# Patient Record
Sex: Female | Born: 1935 | Race: White | Hispanic: No | Marital: Single | Smoking: Former smoker
Health system: Southern US, Community
[De-identification: ages and names within clinical notes are randomized; demographics above are authoritative.]

## PROBLEM LIST (undated history)

## (undated) DIAGNOSIS — I499 Cardiac arrhythmia, unspecified: Secondary | ICD-10-CM

## (undated) DIAGNOSIS — I491 Atrial premature depolarization: Secondary | ICD-10-CM

## (undated) DIAGNOSIS — I5082 Biventricular heart failure: Secondary | ICD-10-CM

## (undated) DIAGNOSIS — C55 Malignant neoplasm of uterus, part unspecified: Secondary | ICD-10-CM

## (undated) DIAGNOSIS — G5600 Carpal tunnel syndrome, unspecified upper limb: Secondary | ICD-10-CM

## (undated) DIAGNOSIS — Z801 Family history of malignant neoplasm of trachea, bronchus and lung: Secondary | ICD-10-CM

## (undated) DIAGNOSIS — E785 Hyperlipidemia, unspecified: Secondary | ICD-10-CM

## (undated) DIAGNOSIS — R0600 Dyspnea, unspecified: Secondary | ICD-10-CM

## (undated) DIAGNOSIS — I5020 Unspecified systolic (congestive) heart failure: Secondary | ICD-10-CM

## (undated) DIAGNOSIS — Z8489 Family history of other specified conditions: Secondary | ICD-10-CM

## (undated) DIAGNOSIS — K589 Irritable bowel syndrome without diarrhea: Secondary | ICD-10-CM

## (undated) DIAGNOSIS — I071 Rheumatic tricuspid insufficiency: Secondary | ICD-10-CM

## (undated) DIAGNOSIS — J449 Chronic obstructive pulmonary disease, unspecified: Secondary | ICD-10-CM

## (undated) DIAGNOSIS — Z853 Personal history of malignant neoplasm of breast: Secondary | ICD-10-CM

## (undated) DIAGNOSIS — Z8042 Family history of malignant neoplasm of prostate: Secondary | ICD-10-CM

## (undated) DIAGNOSIS — N179 Acute kidney failure, unspecified: Secondary | ICD-10-CM

## (undated) DIAGNOSIS — I872 Venous insufficiency (chronic) (peripheral): Secondary | ICD-10-CM

## (undated) DIAGNOSIS — K5730 Diverticulosis of large intestine without perforation or abscess without bleeding: Secondary | ICD-10-CM

## (undated) DIAGNOSIS — Z85828 Personal history of other malignant neoplasm of skin: Secondary | ICD-10-CM

## (undated) DIAGNOSIS — D649 Anemia, unspecified: Secondary | ICD-10-CM

## (undated) DIAGNOSIS — F411 Generalized anxiety disorder: Secondary | ICD-10-CM

## (undated) DIAGNOSIS — E042 Nontoxic multinodular goiter: Secondary | ICD-10-CM

## (undated) DIAGNOSIS — K52831 Collagenous colitis: Secondary | ICD-10-CM

## (undated) DIAGNOSIS — J189 Pneumonia, unspecified organism: Secondary | ICD-10-CM

## (undated) DIAGNOSIS — L03119 Cellulitis of unspecified part of limb: Secondary | ICD-10-CM

## (undated) DIAGNOSIS — M810 Age-related osteoporosis without current pathological fracture: Secondary | ICD-10-CM

## (undated) DIAGNOSIS — M1990 Unspecified osteoarthritis, unspecified site: Secondary | ICD-10-CM

## (undated) DIAGNOSIS — E0590 Thyrotoxicosis, unspecified without thyrotoxic crisis or storm: Secondary | ICD-10-CM

## (undated) DIAGNOSIS — I10 Essential (primary) hypertension: Secondary | ICD-10-CM

## (undated) DIAGNOSIS — E041 Nontoxic single thyroid nodule: Secondary | ICD-10-CM

## (undated) DIAGNOSIS — I509 Heart failure, unspecified: Secondary | ICD-10-CM

## (undated) DIAGNOSIS — M4120 Other idiopathic scoliosis, site unspecified: Secondary | ICD-10-CM

## (undated) DIAGNOSIS — Z808 Family history of malignant neoplasm of other organs or systems: Secondary | ICD-10-CM

## (undated) DIAGNOSIS — L02419 Cutaneous abscess of limb, unspecified: Secondary | ICD-10-CM

## (undated) DIAGNOSIS — Z807 Family history of other malignant neoplasms of lymphoid, hematopoietic and related tissues: Secondary | ICD-10-CM

## (undated) HISTORY — PX: SKIN CANCER EXCISION: SHX779

## (undated) HISTORY — DX: Family history of malignant neoplasm of trachea, bronchus and lung: Z80.1

## (undated) HISTORY — DX: Nontoxic multinodular goiter: E04.2

## (undated) HISTORY — DX: Family history of malignant neoplasm of prostate: Z80.42

## (undated) HISTORY — DX: Irritable bowel syndrome, unspecified: K58.9

## (undated) HISTORY — PX: CATARACT EXTRACTION: SUR2

## (undated) HISTORY — PX: OTHER SURGICAL HISTORY: SHX169

## (undated) HISTORY — PX: TOTAL KNEE ARTHROPLASTY: SHX125

## (undated) HISTORY — DX: Generalized anxiety disorder: F41.1

## (undated) HISTORY — DX: Atrial premature depolarization: I49.1

## (undated) HISTORY — PX: EYE SURGERY: SHX253

## (undated) HISTORY — DX: Essential (primary) hypertension: I10

## (undated) HISTORY — PX: ABDOMINAL HYSTERECTOMY: SHX81

## (undated) HISTORY — DX: Unspecified osteoarthritis, unspecified site: M19.90

## (undated) HISTORY — DX: Venous insufficiency (chronic) (peripheral): I87.2

## (undated) HISTORY — DX: Anemia, unspecified: D64.9

## (undated) HISTORY — PX: HERNIA REPAIR: SHX51

## (undated) HISTORY — PX: MASTECTOMY, PARTIAL: SHX709

## (undated) HISTORY — DX: Family history of malignant neoplasm of other organs or systems: Z80.8

## (undated) HISTORY — DX: Family history of other malignant neoplasms of lymphoid, hematopoietic and related tissues: Z80.7

## (undated) HISTORY — DX: Thyrotoxicosis, unspecified without thyrotoxic crisis or storm: E05.90

## (undated) HISTORY — DX: Personal history of malignant neoplasm of breast: Z85.3

## (undated) HISTORY — DX: Hyperlipidemia, unspecified: E78.5

## (undated) HISTORY — DX: Cutaneous abscess of limb, unspecified: L02.419

## (undated) HISTORY — DX: Collagenous colitis: K52.831

## (undated) HISTORY — DX: Carpal tunnel syndrome, unspecified upper limb: G56.00

## (undated) HISTORY — DX: Nontoxic single thyroid nodule: E04.1

## (undated) HISTORY — DX: Malignant neoplasm of uterus, part unspecified: C55

## (undated) HISTORY — DX: Heart failure, unspecified: I50.9

## (undated) HISTORY — DX: Cellulitis of unspecified part of limb: L03.119

## (undated) HISTORY — DX: Age-related osteoporosis without current pathological fracture: M81.0

## (undated) HISTORY — DX: Family history of other specified conditions: Z84.89

## (undated) HISTORY — DX: Other idiopathic scoliosis, site unspecified: M41.20

## (undated) HISTORY — PX: CARPAL TUNNEL RELEASE: SHX101

## (undated) HISTORY — DX: Personal history of other malignant neoplasm of skin: Z85.828

## (undated) HISTORY — DX: Diverticulosis of large intestine without perforation or abscess without bleeding: K57.30

---

## 1970-08-21 HISTORY — PX: APPENDECTOMY: SHX54

## 1998-10-06 ENCOUNTER — Other Ambulatory Visit: Admission: RE | Admit: 1998-10-06 | Discharge: 1998-10-06 | Payer: Self-pay | Admitting: General Surgery

## 1998-10-12 ENCOUNTER — Encounter: Payer: Self-pay | Admitting: General Surgery

## 1998-10-13 ENCOUNTER — Encounter: Payer: Self-pay | Admitting: General Surgery

## 1998-10-13 ENCOUNTER — Ambulatory Visit (HOSPITAL_COMMUNITY): Admission: RE | Admit: 1998-10-13 | Discharge: 1998-10-13 | Payer: Self-pay | Admitting: General Surgery

## 1998-10-20 ENCOUNTER — Encounter: Admission: RE | Admit: 1998-10-20 | Discharge: 1999-01-18 | Payer: Self-pay | Admitting: *Deleted

## 1998-11-04 ENCOUNTER — Ambulatory Visit (HOSPITAL_COMMUNITY): Admission: RE | Admit: 1998-11-04 | Discharge: 1998-11-04 | Payer: Self-pay | Admitting: Hematology and Oncology

## 1998-11-04 ENCOUNTER — Encounter: Payer: Self-pay | Admitting: Hematology and Oncology

## 1999-02-01 ENCOUNTER — Encounter: Admission: RE | Admit: 1999-02-01 | Discharge: 1999-03-21 | Payer: Self-pay | Admitting: *Deleted

## 1999-03-22 ENCOUNTER — Encounter: Admission: RE | Admit: 1999-03-22 | Discharge: 1999-06-20 | Payer: Self-pay | Admitting: *Deleted

## 1999-08-22 HISTORY — PX: BREAST LUMPECTOMY: SHX2

## 2003-05-06 ENCOUNTER — Ambulatory Visit (HOSPITAL_COMMUNITY): Admission: RE | Admit: 2003-05-06 | Discharge: 2003-05-06 | Payer: Self-pay | Admitting: Oncology

## 2003-05-06 ENCOUNTER — Encounter: Payer: Self-pay | Admitting: Oncology

## 2003-06-03 ENCOUNTER — Ambulatory Visit (HOSPITAL_BASED_OUTPATIENT_CLINIC_OR_DEPARTMENT_OTHER): Admission: RE | Admit: 2003-06-03 | Discharge: 2003-06-03 | Payer: Self-pay | Admitting: Orthopedic Surgery

## 2003-06-03 ENCOUNTER — Ambulatory Visit (HOSPITAL_COMMUNITY): Admission: RE | Admit: 2003-06-03 | Discharge: 2003-06-03 | Payer: Self-pay | Admitting: Orthopedic Surgery

## 2003-12-02 ENCOUNTER — Encounter: Payer: Self-pay | Admitting: Gastroenterology

## 2004-07-20 ENCOUNTER — Ambulatory Visit: Payer: Self-pay | Admitting: Internal Medicine

## 2004-10-26 ENCOUNTER — Ambulatory Visit: Payer: Self-pay | Admitting: Internal Medicine

## 2004-10-26 ENCOUNTER — Ambulatory Visit: Payer: Self-pay | Admitting: Oncology

## 2005-01-25 ENCOUNTER — Ambulatory Visit: Payer: Self-pay | Admitting: Internal Medicine

## 2005-04-20 ENCOUNTER — Ambulatory Visit: Payer: Self-pay | Admitting: Licensed Clinical Social Worker

## 2005-04-26 ENCOUNTER — Ambulatory Visit: Payer: Self-pay | Admitting: Licensed Clinical Social Worker

## 2005-04-26 ENCOUNTER — Ambulatory Visit: Payer: Self-pay | Admitting: Oncology

## 2005-05-03 ENCOUNTER — Ambulatory Visit: Payer: Self-pay | Admitting: Licensed Clinical Social Worker

## 2005-05-10 ENCOUNTER — Ambulatory Visit: Payer: Self-pay | Admitting: Internal Medicine

## 2005-05-11 ENCOUNTER — Ambulatory Visit: Payer: Self-pay | Admitting: Licensed Clinical Social Worker

## 2005-05-18 ENCOUNTER — Ambulatory Visit: Payer: Self-pay | Admitting: Licensed Clinical Social Worker

## 2005-06-01 ENCOUNTER — Ambulatory Visit: Payer: Self-pay | Admitting: Licensed Clinical Social Worker

## 2005-06-14 ENCOUNTER — Ambulatory Visit: Payer: Self-pay | Admitting: Family Medicine

## 2005-06-22 ENCOUNTER — Ambulatory Visit: Payer: Self-pay | Admitting: Licensed Clinical Social Worker

## 2005-06-29 ENCOUNTER — Ambulatory Visit: Payer: Self-pay | Admitting: Internal Medicine

## 2005-07-20 ENCOUNTER — Ambulatory Visit: Payer: Self-pay | Admitting: Licensed Clinical Social Worker

## 2005-09-28 ENCOUNTER — Ambulatory Visit: Payer: Self-pay | Admitting: Internal Medicine

## 2005-11-01 ENCOUNTER — Ambulatory Visit: Payer: Self-pay | Admitting: Oncology

## 2005-11-23 LAB — CBC & DIFF AND RETIC
BASO%: 0.6 % (ref 0.0–2.0)
EOS%: 3.4 % (ref 0.0–7.0)
HCT: 32.8 % — ABNORMAL LOW (ref 34.8–46.6)
LYMPH%: 21.5 % (ref 14.0–48.0)
MCH: 28.4 pg (ref 26.0–34.0)
MCHC: 33.7 g/dL (ref 32.0–36.0)
NEUT%: 69.2 % (ref 39.6–76.8)
Platelets: 428 10*3/uL — ABNORMAL HIGH (ref 145–400)
RBC: 3.89 10*6/uL (ref 3.70–5.32)
Retic %: 2 % (ref 0.4–2.3)
WBC: 7.8 10*3/uL (ref 3.9–10.0)

## 2005-11-23 LAB — MORPHOLOGY: PLT EST: INCREASED

## 2005-11-27 LAB — ERYTHROPOIETIN: Erythropoietin: 22.3 m[IU]/mL (ref 2.6–34.0)

## 2005-11-27 LAB — FOLATE RBC: RBC Folate: 909 ng/mL — ABNORMAL HIGH (ref 180–600)

## 2005-11-27 LAB — IRON AND TIBC
%SAT: 12 % — ABNORMAL LOW (ref 20–55)
TIBC: 477 ug/dL — ABNORMAL HIGH (ref 250–470)

## 2005-11-28 LAB — PROTEIN ELECTROPHORESIS, SERUM
Albumin ELP: 59.2 % (ref 55.8–66.1)
Alpha-1-Globulin: 5.1 % — ABNORMAL HIGH (ref 2.9–4.9)
Alpha-2-Globulin: 11.1 % (ref 7.1–11.8)
Gamma Globulin: 12.9 % (ref 11.1–18.8)
Total Protein, Serum Electrophoresis: 7 g/dL (ref 6.0–8.3)

## 2005-12-27 ENCOUNTER — Ambulatory Visit: Payer: Self-pay | Admitting: Oncology

## 2006-01-03 ENCOUNTER — Ambulatory Visit: Payer: Self-pay | Admitting: Internal Medicine

## 2006-02-13 ENCOUNTER — Ambulatory Visit: Payer: Self-pay | Admitting: Internal Medicine

## 2006-03-21 ENCOUNTER — Ambulatory Visit: Payer: Self-pay | Admitting: Endocrinology

## 2006-05-16 ENCOUNTER — Ambulatory Visit: Payer: Self-pay | Admitting: Internal Medicine

## 2006-08-01 ENCOUNTER — Ambulatory Visit: Payer: Self-pay | Admitting: Internal Medicine

## 2006-08-01 LAB — CONVERTED CEMR LAB
BUN: 26 mg/dL — ABNORMAL HIGH (ref 6–23)
Calcium: 9.6 mg/dL (ref 8.4–10.5)
Chol/HDL Ratio, serum: 3.7
Creatinine, Ser: 1.3 mg/dL — ABNORMAL HIGH (ref 0.4–1.2)
HDL: 46.4 mg/dL (ref >39.0)
Hemoglobin: 10.6 g/dL — ABNORMAL LOW (ref 12.0–15.0)
LDL Cholesterol: 111 mg/dL — ABNORMAL HIGH (ref 0–99)
Potassium: 3.7 meq/L (ref 3.5–5.1)
RBC: 3.65 M/uL — ABNORMAL LOW (ref 3.87–5.11)
RDW: 13.1 % (ref 11.5–14.6)
VLDL: 14 mg/dL (ref 0–40)

## 2006-08-08 ENCOUNTER — Ambulatory Visit: Payer: Self-pay | Admitting: Internal Medicine

## 2006-08-30 ENCOUNTER — Encounter: Admission: RE | Admit: 2006-08-30 | Discharge: 2006-08-30 | Payer: Self-pay | Admitting: Internal Medicine

## 2006-09-10 ENCOUNTER — Other Ambulatory Visit: Admission: RE | Admit: 2006-09-10 | Discharge: 2006-09-10 | Payer: Self-pay | Admitting: Endocrinology

## 2006-09-10 ENCOUNTER — Ambulatory Visit: Payer: Self-pay | Admitting: Endocrinology

## 2006-09-10 ENCOUNTER — Encounter (INDEPENDENT_AMBULATORY_CARE_PROVIDER_SITE_OTHER): Payer: Self-pay | Admitting: *Deleted

## 2006-09-19 ENCOUNTER — Ambulatory Visit: Payer: Self-pay | Admitting: Internal Medicine

## 2006-10-10 ENCOUNTER — Ambulatory Visit: Payer: Self-pay | Admitting: Internal Medicine

## 2006-10-24 ENCOUNTER — Ambulatory Visit: Payer: Self-pay | Admitting: Internal Medicine

## 2006-10-30 ENCOUNTER — Ambulatory Visit: Payer: Self-pay | Admitting: Oncology

## 2006-11-01 LAB — CBC WITH DIFFERENTIAL/PLATELET
Eosinophils Absolute: 0.2 10*3/uL (ref 0.0–0.5)
MCV: 87.2 fL (ref 81.0–101.0)
MONO%: 5.9 % (ref 0.0–13.0)
NEUT#: 5.2 10*3/uL (ref 1.5–6.5)
RBC: 4 10*6/uL (ref 3.70–5.32)
RDW: 18.5 % — ABNORMAL HIGH (ref 11.3–14.5)
WBC: 7.7 10*3/uL (ref 3.9–10.0)
lymph#: 1.7 10*3/uL (ref 0.9–3.3)

## 2006-11-01 LAB — COMPREHENSIVE METABOLIC PANEL
AST: 25 U/L (ref 0–37)
Albumin: 4.4 g/dL (ref 3.5–5.2)
Alkaline Phosphatase: 83 U/L (ref 39–117)
Chloride: 99 mEq/L (ref 96–112)
Glucose, Bld: 144 mg/dL — ABNORMAL HIGH (ref 70–99)
Potassium: 4.1 mEq/L (ref 3.5–5.3)
Sodium: 140 mEq/L (ref 135–145)
Total Protein: 7.3 g/dL (ref 6.0–8.3)

## 2006-11-01 LAB — CANCER ANTIGEN 27.29: CA 27.29: 55 U/mL — ABNORMAL HIGH (ref 0–39)

## 2006-12-01 ENCOUNTER — Encounter: Admission: RE | Admit: 2006-12-01 | Discharge: 2006-12-01 | Payer: Self-pay | Admitting: Orthopedic Surgery

## 2006-12-13 ENCOUNTER — Encounter: Admission: RE | Admit: 2006-12-13 | Discharge: 2006-12-13 | Payer: Self-pay | Admitting: Orthopedic Surgery

## 2007-01-02 ENCOUNTER — Ambulatory Visit: Payer: Self-pay | Admitting: Internal Medicine

## 2007-01-02 LAB — CONVERTED CEMR LAB
Basophils Absolute: 0 10*3/uL (ref 0.0–0.1)
Eosinophils Absolute: 0.4 10*3/uL (ref 0.0–0.6)
Hemoglobin: 12.8 g/dL (ref 12.0–15.0)
Iron: 105 ug/dL (ref 42–145)
Lymphocytes Relative: 18.5 % (ref 12.0–46.0)
MCHC: 35.3 g/dL (ref 30.0–36.0)
MCV: 90.6 fL (ref 78.0–100.0)
Monocytes Absolute: 0.7 10*3/uL (ref 0.2–0.7)
Monocytes Relative: 8 % (ref 3.0–11.0)
Neutro Abs: 6.3 10*3/uL (ref 1.4–7.7)
Neutrophils Relative %: 69 % (ref 43.0–77.0)

## 2007-01-24 ENCOUNTER — Ambulatory Visit: Payer: Self-pay | Admitting: Gastroenterology

## 2007-03-08 ENCOUNTER — Ambulatory Visit: Payer: Self-pay | Admitting: Endocrinology

## 2007-03-16 ENCOUNTER — Encounter: Payer: Self-pay | Admitting: Internal Medicine

## 2007-03-16 DIAGNOSIS — Z853 Personal history of malignant neoplasm of breast: Secondary | ICD-10-CM

## 2007-03-16 DIAGNOSIS — F411 Generalized anxiety disorder: Secondary | ICD-10-CM

## 2007-03-16 DIAGNOSIS — M1990 Unspecified osteoarthritis, unspecified site: Secondary | ICD-10-CM

## 2007-03-16 DIAGNOSIS — I10 Essential (primary) hypertension: Secondary | ICD-10-CM

## 2007-03-16 DIAGNOSIS — F419 Anxiety disorder, unspecified: Secondary | ICD-10-CM

## 2007-03-16 HISTORY — DX: Personal history of malignant neoplasm of breast: Z85.3

## 2007-03-16 HISTORY — DX: Generalized anxiety disorder: F41.1

## 2007-03-16 HISTORY — DX: Essential (primary) hypertension: I10

## 2007-03-16 HISTORY — DX: Unspecified osteoarthritis, unspecified site: M19.90

## 2007-04-24 ENCOUNTER — Ambulatory Visit: Payer: Self-pay | Admitting: Internal Medicine

## 2007-04-30 ENCOUNTER — Ambulatory Visit: Payer: Self-pay | Admitting: Oncology

## 2007-05-02 LAB — COMPREHENSIVE METABOLIC PANEL
ALT: 15 U/L (ref 0–35)
AST: 21 U/L (ref 0–37)
Albumin: 4.5 g/dL (ref 3.5–5.2)
Alkaline Phosphatase: 90 U/L (ref 39–117)
Calcium: 9.4 mg/dL (ref 8.4–10.5)
Chloride: 102 mEq/L (ref 96–112)
Potassium: 4.2 mEq/L (ref 3.5–5.3)
Sodium: 142 mEq/L (ref 135–145)
Total Protein: 7.2 g/dL (ref 6.0–8.3)

## 2007-05-02 LAB — CBC WITH DIFFERENTIAL/PLATELET
BASO%: 0.6 % (ref 0.0–2.0)
Basophils Absolute: 0 10*3/uL (ref 0.0–0.1)
EOS%: 4.4 % (ref 0.0–7.0)
HGB: 11.7 g/dL (ref 11.6–15.9)
MCH: 30.2 pg (ref 26.0–34.0)
RBC: 3.88 10*6/uL (ref 3.70–5.32)
RDW: 14.4 % (ref 11.3–14.5)
lymph#: 1.8 10*3/uL (ref 0.9–3.3)

## 2007-07-03 ENCOUNTER — Ambulatory Visit: Payer: Self-pay | Admitting: Internal Medicine

## 2007-07-03 DIAGNOSIS — S81809A Unspecified open wound, unspecified lower leg, initial encounter: Secondary | ICD-10-CM

## 2007-07-03 DIAGNOSIS — S81009A Unspecified open wound, unspecified knee, initial encounter: Secondary | ICD-10-CM

## 2007-07-03 DIAGNOSIS — S91009A Unspecified open wound, unspecified ankle, initial encounter: Secondary | ICD-10-CM

## 2007-07-24 ENCOUNTER — Ambulatory Visit: Payer: Self-pay | Admitting: Internal Medicine

## 2007-07-24 ENCOUNTER — Encounter: Payer: Self-pay | Admitting: Internal Medicine

## 2007-08-20 ENCOUNTER — Ambulatory Visit: Payer: Self-pay | Admitting: Internal Medicine

## 2007-08-20 DIAGNOSIS — L02419 Cutaneous abscess of limb, unspecified: Secondary | ICD-10-CM | POA: Insufficient documentation

## 2007-08-20 DIAGNOSIS — D631 Anemia in chronic kidney disease: Secondary | ICD-10-CM | POA: Insufficient documentation

## 2007-08-20 DIAGNOSIS — L03119 Cellulitis of unspecified part of limb: Secondary | ICD-10-CM

## 2007-08-20 DIAGNOSIS — K5730 Diverticulosis of large intestine without perforation or abscess without bleeding: Secondary | ICD-10-CM

## 2007-08-20 DIAGNOSIS — D649 Anemia, unspecified: Secondary | ICD-10-CM

## 2007-08-20 DIAGNOSIS — M810 Age-related osteoporosis without current pathological fracture: Secondary | ICD-10-CM | POA: Insufficient documentation

## 2007-08-20 DIAGNOSIS — N189 Chronic kidney disease, unspecified: Secondary | ICD-10-CM

## 2007-08-20 DIAGNOSIS — E785 Hyperlipidemia, unspecified: Secondary | ICD-10-CM

## 2007-08-20 HISTORY — DX: Hyperlipidemia, unspecified: E78.5

## 2007-08-20 HISTORY — DX: Anemia, unspecified: D64.9

## 2007-08-20 HISTORY — DX: Cellulitis of unspecified part of limb: L02.419

## 2007-08-20 HISTORY — DX: Age-related osteoporosis without current pathological fracture: M81.0

## 2007-08-20 HISTORY — DX: Diverticulosis of large intestine without perforation or abscess without bleeding: K57.30

## 2007-09-05 ENCOUNTER — Ambulatory Visit: Payer: Self-pay | Admitting: Internal Medicine

## 2007-09-05 DIAGNOSIS — I872 Venous insufficiency (chronic) (peripheral): Secondary | ICD-10-CM

## 2007-09-05 DIAGNOSIS — R609 Edema, unspecified: Secondary | ICD-10-CM

## 2007-09-05 HISTORY — DX: Venous insufficiency (chronic) (peripheral): I87.2

## 2007-10-09 ENCOUNTER — Ambulatory Visit: Payer: Self-pay | Admitting: Internal Medicine

## 2007-10-17 ENCOUNTER — Encounter (HOSPITAL_BASED_OUTPATIENT_CLINIC_OR_DEPARTMENT_OTHER): Admission: RE | Admit: 2007-10-17 | Discharge: 2007-12-23 | Payer: Self-pay | Admitting: Internal Medicine

## 2007-12-12 ENCOUNTER — Telehealth: Payer: Self-pay | Admitting: Internal Medicine

## 2008-01-08 ENCOUNTER — Ambulatory Visit: Payer: Self-pay | Admitting: Internal Medicine

## 2008-01-08 LAB — CONVERTED CEMR LAB
BUN: 15 mg/dL (ref 6–23)
Chloride: 106 meq/L (ref 96–112)
GFR calc non Af Amer: 51 mL/min
Glucose, Bld: 108 mg/dL — ABNORMAL HIGH (ref 70–99)
Potassium: 4.1 meq/L (ref 3.5–5.1)
Sodium: 140 meq/L (ref 135–145)

## 2008-01-11 DIAGNOSIS — M4120 Other idiopathic scoliosis, site unspecified: Secondary | ICD-10-CM

## 2008-01-11 DIAGNOSIS — G5600 Carpal tunnel syndrome, unspecified upper limb: Secondary | ICD-10-CM | POA: Insufficient documentation

## 2008-01-11 HISTORY — DX: Carpal tunnel syndrome, unspecified upper limb: G56.00

## 2008-01-11 HISTORY — DX: Other idiopathic scoliosis, site unspecified: M41.20

## 2008-01-15 ENCOUNTER — Encounter: Payer: Self-pay | Admitting: Internal Medicine

## 2008-04-08 ENCOUNTER — Ambulatory Visit: Payer: Self-pay | Admitting: Internal Medicine

## 2008-04-08 LAB — CONVERTED CEMR LAB
BUN: 13 mg/dL (ref 6–23)
Bacteria, UA: NEGATIVE
Chloride: 107 meq/L (ref 96–112)
Crystals: NEGATIVE
GFR calc Af Amer: 69 mL/min
GFR calc non Af Amer: 57 mL/min
Ketones, ur: NEGATIVE mg/dL
Mucus, UA: NEGATIVE
Potassium: 4 meq/L (ref 3.5–5.1)
RBC / HPF: NONE SEEN
Sodium: 143 meq/L (ref 135–145)
Specific Gravity, Urine: 1.01 (ref 1.000–1.03)
TSH: 0.97 microintl units/mL (ref 0.35–5.50)
Urine Glucose: NEGATIVE mg/dL
Urobilinogen, UA: 0.2 (ref 0.0–1.0)
pH: 8 (ref 5.0–8.0)

## 2008-04-10 ENCOUNTER — Encounter: Payer: Self-pay | Admitting: Internal Medicine

## 2008-04-15 ENCOUNTER — Ambulatory Visit: Payer: Self-pay | Admitting: Internal Medicine

## 2008-04-24 ENCOUNTER — Ambulatory Visit: Payer: Self-pay | Admitting: Oncology

## 2008-04-29 ENCOUNTER — Ambulatory Visit: Payer: Self-pay | Admitting: Endocrinology

## 2008-04-30 LAB — CBC WITH DIFFERENTIAL/PLATELET
BASO%: 0.7 % (ref 0.0–2.0)
Eosinophils Absolute: 0.3 10*3/uL (ref 0.0–0.5)
HCT: 32.9 % — ABNORMAL LOW (ref 34.8–46.6)
HGB: 10.6 g/dL — ABNORMAL LOW (ref 11.6–15.9)
MCHC: 32.3 g/dL (ref 32.0–36.0)
MONO#: 0.5 10*3/uL (ref 0.1–0.9)
NEUT#: 3.8 10*3/uL (ref 1.5–6.5)
NEUT%: 58.2 % (ref 39.6–76.8)
Platelets: 397 10*3/uL (ref 145–400)
WBC: 6.5 10*3/uL (ref 3.9–10.0)
lymph#: 1.9 10*3/uL (ref 0.9–3.3)

## 2008-05-01 LAB — COMPREHENSIVE METABOLIC PANEL
ALT: 68 U/L — ABNORMAL HIGH (ref 0–35)
AST: 28 U/L (ref 0–37)
Albumin: 4.4 g/dL (ref 3.5–5.2)
BUN: 16 mg/dL (ref 6–23)
CO2: 25 mEq/L (ref 19–32)
Calcium: 9.6 mg/dL (ref 8.4–10.5)
Chloride: 104 mEq/L (ref 96–112)
Potassium: 4.5 mEq/L (ref 3.5–5.3)

## 2008-05-01 LAB — LACTATE DEHYDROGENASE: LDH: 199 U/L (ref 94–250)

## 2008-05-07 ENCOUNTER — Encounter: Payer: Self-pay | Admitting: Internal Medicine

## 2008-05-08 ENCOUNTER — Telehealth (INDEPENDENT_AMBULATORY_CARE_PROVIDER_SITE_OTHER): Payer: Self-pay | Admitting: *Deleted

## 2008-06-09 ENCOUNTER — Ambulatory Visit: Payer: Self-pay | Admitting: Oncology

## 2008-06-11 LAB — COMPREHENSIVE METABOLIC PANEL
ALT: 18 U/L (ref 0–35)
BUN: 25 mg/dL — ABNORMAL HIGH (ref 6–23)
CO2: 27 mEq/L (ref 19–32)
Calcium: 10.5 mg/dL (ref 8.4–10.5)
Chloride: 99 mEq/L (ref 96–112)
Creatinine, Ser: 1.27 mg/dL — ABNORMAL HIGH (ref 0.40–1.20)
Glucose, Bld: 75 mg/dL (ref 70–99)
Total Bilirubin: 0.5 mg/dL (ref 0.3–1.2)

## 2008-07-28 ENCOUNTER — Encounter: Payer: Self-pay | Admitting: Internal Medicine

## 2008-08-05 ENCOUNTER — Ambulatory Visit: Payer: Self-pay | Admitting: Internal Medicine

## 2008-08-05 LAB — CONVERTED CEMR LAB
Basophils Absolute: 0.1 10*3/uL (ref 0.0–0.1)
Basophils Relative: 1.1 % (ref 0.0–3.0)
Eosinophils Relative: 5.1 % — ABNORMAL HIGH (ref 0.0–5.0)
Hemoglobin: 12.2 g/dL (ref 12.0–15.0)
Lymphocytes Relative: 23.9 % (ref 12.0–46.0)
MCHC: 34.7 g/dL (ref 30.0–36.0)
Monocytes Relative: 7.9 % (ref 3.0–12.0)
Neutro Abs: 4.2 10*3/uL (ref 1.4–7.7)
Neutrophils Relative %: 62 % (ref 43.0–77.0)
RBC: 3.98 M/uL (ref 3.87–5.11)
Saturation Ratios: 10.5 % — ABNORMAL LOW (ref 20.0–50.0)
WBC: 6.7 10*3/uL (ref 4.5–10.5)

## 2008-08-12 ENCOUNTER — Ambulatory Visit: Payer: Self-pay | Admitting: Internal Medicine

## 2008-08-21 HISTORY — PX: TOTAL KNEE ARTHROPLASTY: SHX125

## 2008-11-04 ENCOUNTER — Ambulatory Visit: Payer: Self-pay | Admitting: Internal Medicine

## 2008-11-04 LAB — CONVERTED CEMR LAB
Basophils Absolute: 0.1 10*3/uL (ref 0.0–0.1)
CO2: 32 meq/L (ref 19–32)
Calcium: 9.6 mg/dL (ref 8.4–10.5)
Chloride: 104 meq/L (ref 96–112)
Glucose, Bld: 99 mg/dL (ref 70–99)
HCT: 38.1 % (ref 36.0–46.0)
HDL: 41.5 mg/dL (ref >39.00)
Iron: 71 ug/dL (ref 42–145)
Lymphs Abs: 1.3 10*3/uL (ref 0.7–4.0)
Monocytes Absolute: 0.5 10*3/uL (ref 0.1–1.0)
Monocytes Relative: 6.2 % (ref 3.0–12.0)
Neutrophils Relative %: 69.5 % (ref 43.0–77.0)
Platelets: 289 10*3/uL (ref 150.0–400.0)
Potassium: 4 meq/L (ref 3.5–5.1)
RDW: 13.6 % (ref 11.5–14.6)
Sodium: 143 meq/L (ref 135–145)
Total CHOL/HDL Ratio: 5
Triglycerides: 108 mg/dL (ref 0.0–149.0)
VLDL: 21.6 mg/dL (ref 0.0–40.0)
Vit D, 25-Hydroxy: 45 ng/mL (ref 30–89)
WBC: 7.4 10*3/uL (ref 4.5–10.5)

## 2008-11-12 ENCOUNTER — Ambulatory Visit: Payer: Self-pay | Admitting: Internal Medicine

## 2008-11-12 DIAGNOSIS — Z85828 Personal history of other malignant neoplasm of skin: Secondary | ICD-10-CM

## 2008-11-12 HISTORY — DX: Personal history of other malignant neoplasm of skin: Z85.828

## 2008-12-23 ENCOUNTER — Telehealth: Payer: Self-pay | Admitting: Internal Medicine

## 2008-12-25 ENCOUNTER — Encounter (INDEPENDENT_AMBULATORY_CARE_PROVIDER_SITE_OTHER): Payer: Self-pay | Admitting: *Deleted

## 2009-01-20 ENCOUNTER — Encounter: Payer: Self-pay | Admitting: Internal Medicine

## 2009-01-20 ENCOUNTER — Telehealth: Payer: Self-pay | Admitting: Internal Medicine

## 2009-01-21 ENCOUNTER — Encounter: Payer: Self-pay | Admitting: Internal Medicine

## 2009-02-02 ENCOUNTER — Encounter: Payer: Self-pay | Admitting: Internal Medicine

## 2009-02-09 ENCOUNTER — Encounter: Payer: Self-pay | Admitting: Internal Medicine

## 2009-03-24 ENCOUNTER — Ambulatory Visit: Payer: Self-pay | Admitting: Internal Medicine

## 2009-03-24 LAB — CONVERTED CEMR LAB
BUN: 31 mg/dL — ABNORMAL HIGH (ref 6–23)
Basophils Relative: 1 % (ref 0.0–3.0)
Bilirubin, Direct: 0.2 mg/dL (ref 0.0–0.3)
Creatinine, Ser: 1.5 mg/dL — ABNORMAL HIGH (ref 0.4–1.2)
Eosinophils Relative: 5.8 % — ABNORMAL HIGH (ref 0.0–5.0)
GFR calc non Af Amer: 35.72 mL/min (ref >60)
HCT: 37.3 % (ref 36.0–46.0)
Hemoglobin: 12.7 g/dL (ref 12.0–15.0)
Lymphs Abs: 1.9 10*3/uL (ref 0.7–4.0)
MCV: 93.4 fL (ref 78.0–100.0)
Monocytes Absolute: 0.6 10*3/uL (ref 0.1–1.0)
Neutro Abs: 4 10*3/uL (ref 1.4–7.7)
RBC: 3.99 M/uL (ref 3.87–5.11)
Total Bilirubin: 0.8 mg/dL (ref 0.3–1.2)
WBC: 7 10*3/uL (ref 4.5–10.5)

## 2009-03-31 ENCOUNTER — Ambulatory Visit: Payer: Self-pay | Admitting: Endocrinology

## 2009-04-01 LAB — CONVERTED CEMR LAB: TSH: 0.61 microintl units/mL (ref 0.35–5.50)

## 2009-04-02 ENCOUNTER — Ambulatory Visit: Payer: Self-pay | Admitting: Internal Medicine

## 2009-04-02 DIAGNOSIS — R9431 Abnormal electrocardiogram [ECG] [EKG]: Secondary | ICD-10-CM

## 2009-04-12 ENCOUNTER — Telehealth (INDEPENDENT_AMBULATORY_CARE_PROVIDER_SITE_OTHER): Payer: Self-pay | Admitting: *Deleted

## 2009-04-14 ENCOUNTER — Encounter: Admission: RE | Admit: 2009-04-14 | Discharge: 2009-04-14 | Payer: Self-pay | Admitting: Internal Medicine

## 2009-04-22 ENCOUNTER — Ambulatory Visit: Payer: Self-pay | Admitting: Internal Medicine

## 2009-04-23 LAB — CONVERTED CEMR LAB
BUN: 25 mg/dL — ABNORMAL HIGH (ref 6–23)
CO2: 34 meq/L — ABNORMAL HIGH (ref 19–32)
Chloride: 99 meq/L (ref 96–112)
Creatinine, Ser: 1.2 mg/dL (ref 0.4–1.2)
Glucose, Bld: 100 mg/dL — ABNORMAL HIGH (ref 70–99)

## 2009-04-29 ENCOUNTER — Encounter: Payer: Self-pay | Admitting: Internal Medicine

## 2009-04-29 ENCOUNTER — Ambulatory Visit: Payer: Self-pay | Admitting: Cardiology

## 2009-05-10 ENCOUNTER — Telehealth (INDEPENDENT_AMBULATORY_CARE_PROVIDER_SITE_OTHER): Payer: Self-pay

## 2009-05-11 ENCOUNTER — Ambulatory Visit: Payer: Self-pay

## 2009-05-11 ENCOUNTER — Encounter: Payer: Self-pay | Admitting: Internal Medicine

## 2009-05-13 ENCOUNTER — Telehealth: Payer: Self-pay | Admitting: Internal Medicine

## 2009-05-14 ENCOUNTER — Telehealth (INDEPENDENT_AMBULATORY_CARE_PROVIDER_SITE_OTHER): Payer: Self-pay | Admitting: *Deleted

## 2009-05-17 ENCOUNTER — Inpatient Hospital Stay (HOSPITAL_COMMUNITY): Admission: RE | Admit: 2009-05-17 | Discharge: 2009-05-20 | Payer: Self-pay | Admitting: Orthopedic Surgery

## 2009-06-03 ENCOUNTER — Encounter: Payer: Self-pay | Admitting: Internal Medicine

## 2009-06-10 ENCOUNTER — Telehealth: Payer: Self-pay | Admitting: Internal Medicine

## 2009-06-14 ENCOUNTER — Ambulatory Visit: Payer: Self-pay | Admitting: Internal Medicine

## 2009-06-14 DIAGNOSIS — Z87891 Personal history of nicotine dependence: Secondary | ICD-10-CM | POA: Insufficient documentation

## 2009-06-17 ENCOUNTER — Telehealth: Payer: Self-pay | Admitting: Internal Medicine

## 2009-07-05 ENCOUNTER — Encounter: Payer: Self-pay | Admitting: Internal Medicine

## 2009-09-08 ENCOUNTER — Ambulatory Visit: Payer: Self-pay | Admitting: Internal Medicine

## 2009-09-08 LAB — CONVERTED CEMR LAB
ALT: 17 units/L (ref 0–35)
BUN: 20 mg/dL (ref 6–23)
Basophils Relative: 1.7 % (ref 0.0–3.0)
Bilirubin, Direct: 0.1 mg/dL (ref 0.0–0.3)
Eosinophils Relative: 7.9 % — ABNORMAL HIGH (ref 0.0–5.0)
GFR calc non Af Amer: 56.96 mL/min (ref >60)
Glucose, Bld: 85 mg/dL (ref 70–99)
HCT: 32.5 % — ABNORMAL LOW (ref 36.0–46.0)
Hemoglobin: 10.5 g/dL — ABNORMAL LOW (ref 12.0–15.0)
Lymphs Abs: 1.3 10*3/uL (ref 0.7–4.0)
Monocytes Relative: 9.5 % (ref 3.0–12.0)
Neutro Abs: 2.8 10*3/uL (ref 1.4–7.7)
Potassium: 4.1 meq/L (ref 3.5–5.1)
Total CHOL/HDL Ratio: 3
Total Protein: 6.4 g/dL (ref 6.0–8.3)
Triglycerides: 76 mg/dL (ref 0.0–149.0)
WBC: 5.1 10*3/uL (ref 4.5–10.5)

## 2009-09-15 ENCOUNTER — Telehealth: Payer: Self-pay | Admitting: Internal Medicine

## 2009-09-15 ENCOUNTER — Ambulatory Visit: Payer: Self-pay | Admitting: Internal Medicine

## 2009-09-28 ENCOUNTER — Ambulatory Visit: Payer: Self-pay | Admitting: Internal Medicine

## 2009-09-28 LAB — CONVERTED CEMR LAB
Fecal Occult Blood: NEGATIVE
OCCULT 3: NEGATIVE
OCCULT 4: NEGATIVE

## 2009-10-27 ENCOUNTER — Ambulatory Visit: Payer: Self-pay | Admitting: Internal Medicine

## 2009-10-27 ENCOUNTER — Encounter: Payer: Self-pay | Admitting: Internal Medicine

## 2009-11-30 ENCOUNTER — Telehealth (INDEPENDENT_AMBULATORY_CARE_PROVIDER_SITE_OTHER): Payer: Self-pay | Admitting: *Deleted

## 2009-12-06 ENCOUNTER — Telehealth: Payer: Self-pay | Admitting: Internal Medicine

## 2009-12-08 ENCOUNTER — Ambulatory Visit: Payer: Self-pay | Admitting: Internal Medicine

## 2009-12-08 LAB — CONVERTED CEMR LAB
Basophils Absolute: 0 10*3/uL (ref 0.0–0.1)
Basophils Relative: 0.7 % (ref 0.0–3.0)
Calcium: 9.5 mg/dL (ref 8.4–10.5)
GFR calc non Af Amer: 51 mL/min (ref >60)
Hemoglobin: 11 g/dL — ABNORMAL LOW (ref 12.0–15.0)
Lymphocytes Relative: 29.7 % (ref 12.0–46.0)
Monocytes Relative: 9.4 % (ref 3.0–12.0)
Neutro Abs: 3.4 10*3/uL (ref 1.4–7.7)
Neutrophils Relative %: 54.4 % (ref 43.0–77.0)
RBC: 3.98 M/uL (ref 3.87–5.11)
RDW: 17.3 % — ABNORMAL HIGH (ref 11.5–14.6)
Saturation Ratios: 9.7 % — ABNORMAL LOW (ref 20.0–50.0)
Sodium: 144 meq/L (ref 135–145)
Transferrin: 345.4 mg/dL (ref 212.0–360.0)

## 2009-12-15 ENCOUNTER — Ambulatory Visit: Payer: Self-pay | Admitting: Internal Medicine

## 2010-02-09 ENCOUNTER — Encounter: Payer: Self-pay | Admitting: Internal Medicine

## 2010-02-17 ENCOUNTER — Encounter: Payer: Self-pay | Admitting: Internal Medicine

## 2010-02-22 ENCOUNTER — Encounter: Payer: Self-pay | Admitting: Internal Medicine

## 2010-03-09 ENCOUNTER — Ambulatory Visit: Payer: Self-pay | Admitting: Internal Medicine

## 2010-03-09 LAB — CONVERTED CEMR LAB
Basophils Relative: 1.1 % (ref 0.0–3.0)
CO2: 31 meq/L (ref 19–32)
Calcium: 8.9 mg/dL (ref 8.4–10.5)
Eosinophils Absolute: 0.3 10*3/uL (ref 0.0–0.7)
Eosinophils Relative: 6.1 % — ABNORMAL HIGH (ref 0.0–5.0)
Hemoglobin: 12.1 g/dL (ref 12.0–15.0)
Lymphocytes Relative: 30.6 % (ref 12.0–46.0)
MCHC: 33.8 g/dL (ref 30.0–36.0)
Monocytes Relative: 11.2 % (ref 3.0–12.0)
Neutro Abs: 2.5 10*3/uL (ref 1.4–7.7)
RBC: 4.04 M/uL (ref 3.87–5.11)
Sodium: 141 meq/L (ref 135–145)
WBC: 4.8 10*3/uL (ref 4.5–10.5)

## 2010-03-16 ENCOUNTER — Ambulatory Visit: Payer: Self-pay | Admitting: Internal Medicine

## 2010-04-13 ENCOUNTER — Ambulatory Visit: Payer: Self-pay | Admitting: Endocrinology

## 2010-04-14 ENCOUNTER — Telehealth: Payer: Self-pay | Admitting: Endocrinology

## 2010-04-19 ENCOUNTER — Telehealth (INDEPENDENT_AMBULATORY_CARE_PROVIDER_SITE_OTHER): Payer: Self-pay | Admitting: *Deleted

## 2010-05-05 ENCOUNTER — Encounter (HOSPITAL_COMMUNITY)
Admission: RE | Admit: 2010-05-05 | Discharge: 2010-08-03 | Payer: Self-pay | Source: Home / Self Care | Attending: Endocrinology | Admitting: Endocrinology

## 2010-05-06 DIAGNOSIS — E042 Nontoxic multinodular goiter: Secondary | ICD-10-CM

## 2010-05-06 HISTORY — DX: Nontoxic multinodular goiter: E04.2

## 2010-05-19 ENCOUNTER — Ambulatory Visit (HOSPITAL_COMMUNITY): Admission: RE | Admit: 2010-05-19 | Discharge: 2010-05-19 | Payer: Self-pay | Admitting: Endocrinology

## 2010-05-23 ENCOUNTER — Ambulatory Visit: Payer: Self-pay | Admitting: Internal Medicine

## 2010-05-23 DIAGNOSIS — J069 Acute upper respiratory infection, unspecified: Secondary | ICD-10-CM | POA: Insufficient documentation

## 2010-05-23 DIAGNOSIS — E0590 Thyrotoxicosis, unspecified without thyrotoxic crisis or storm: Secondary | ICD-10-CM

## 2010-05-23 HISTORY — DX: Thyrotoxicosis, unspecified without thyrotoxic crisis or storm: E05.90

## 2010-06-23 ENCOUNTER — Telehealth: Payer: Self-pay | Admitting: Internal Medicine

## 2010-07-19 ENCOUNTER — Telehealth: Payer: Self-pay | Admitting: Internal Medicine

## 2010-08-23 ENCOUNTER — Encounter: Payer: Self-pay | Admitting: Internal Medicine

## 2010-09-11 ENCOUNTER — Encounter: Payer: Self-pay | Admitting: Oncology

## 2010-09-14 ENCOUNTER — Other Ambulatory Visit: Payer: Self-pay | Admitting: Internal Medicine

## 2010-09-14 ENCOUNTER — Encounter: Payer: Self-pay | Admitting: Internal Medicine

## 2010-09-14 ENCOUNTER — Ambulatory Visit
Admission: RE | Admit: 2010-09-14 | Discharge: 2010-09-14 | Payer: Self-pay | Source: Home / Self Care | Attending: Internal Medicine | Admitting: Internal Medicine

## 2010-09-14 LAB — URINALYSIS, ROUTINE W REFLEX MICROSCOPIC
Ketones, ur: NEGATIVE
Specific Gravity, Urine: 1.015 (ref 1.000–1.030)
Urobilinogen, UA: 0.2 (ref 0.0–1.0)

## 2010-09-14 LAB — CBC WITH DIFFERENTIAL/PLATELET
Basophils Absolute: 0.1 10*3/uL (ref 0.0–0.1)
Eosinophils Absolute: 0.3 10*3/uL (ref 0.0–0.7)
HCT: 37.3 % (ref 36.0–46.0)
Hemoglobin: 12.9 g/dL (ref 12.0–15.0)
Lymphs Abs: 1.6 10*3/uL (ref 0.7–4.0)
MCHC: 34.5 g/dL (ref 30.0–36.0)
MCV: 91.4 fl (ref 78.0–100.0)
Neutro Abs: 3.3 10*3/uL (ref 1.4–7.7)
RDW: 14.8 % — ABNORMAL HIGH (ref 11.5–14.6)

## 2010-09-14 LAB — BASIC METABOLIC PANEL
CO2: 31 mEq/L (ref 19–32)
Glucose, Bld: 85 mg/dL (ref 70–99)
Potassium: 4.7 mEq/L (ref 3.5–5.1)
Sodium: 142 mEq/L (ref 135–145)

## 2010-09-14 LAB — HEPATIC FUNCTION PANEL
AST: 27 U/L (ref 0–37)
Albumin: 3.9 g/dL (ref 3.5–5.2)

## 2010-09-14 LAB — IBC PANEL
Saturation Ratios: 20.2 % (ref 20.0–50.0)
Transferrin: 331.9 mg/dL (ref 212.0–360.0)

## 2010-09-14 LAB — LIPID PANEL: HDL: 62.3 mg/dL (ref >39.00)

## 2010-09-14 LAB — TSH: TSH: 3.63 u[IU]/mL (ref 0.35–5.50)

## 2010-09-16 ENCOUNTER — Encounter: Payer: Self-pay | Admitting: Internal Medicine

## 2010-09-20 NOTE — Miscellaneous (Signed)
Summary: BONE DENSITY  Clinical Lists Changes  Orders: Added new Test order of T-Bone Densitometry (77080) - Signed Added new Test order of T-Lumbar Vertebral Assessment (77082) - Signed 

## 2010-09-20 NOTE — Progress Notes (Signed)
Summary: Rf Alprazolam  Phone Note Refill Request Message from:  Fax from Pharmacy  Refills Requested: Medication #1:  ALPRAZOLAM 0.25 MG  TABS take 1-2  two times a day prn   Dosage confirmed as above?Dosage Confirmed   Supply Requested: 60   Last Refilled: 06/15/2010  Method Requested: Telephone to Pharmacy Next Appointment Scheduled: 09-21-2010 Initial call taken by: Jonathon Resides, [facility]),  June 23, 2010 4:11 PM  Follow-up for Phone Call        ok 3 ref Follow-up by: Cassandria Anger MD,  June 24, 2010 12:18 AM  Additional Follow-up for Phone Call Additional follow up Details #1::        Rx called to pharmacy Additional Follow-up by: Jonathon Resides, [facility]),  June 24, 2010 12:02 PM    Prescriptions: ALPRAZOLAM 0.25 MG  TABS (ALPRAZOLAM) take 1-2  two times a day prn  #60 x 3   Entered by:   Jonathon Resides, [facility])   Authorized by:   Cassandria Anger MD   Signed by:   Jonathon Resides, CMA(AAMA) on 06/24/2010   Method used:   Telephoned to ...       [facility] [address] (retail)       [address]       [place], [place]  [address]       Ph: [phone] or [phone]       Fax: [phone]   RxID:   [id number]

## 2010-09-20 NOTE — Assessment & Plan Note (Signed)
Summary: FU ON THYROID /NWS   Vital Signs:  Patient profile:   77 year old female Height:      63 inches (160.02 cm) Weight:      139 pounds (63.18 kg) BMI:     24.71 O2 Sat:      97 % on Room air Temp:     97.4 degrees F (36.33 degrees C) oral Pulse rate:   66 / minute BP sitting:   124 / 78  (left arm) Cuff size:   regular  Vitals Entered By: Rebeca Alert MA (April 13, 2010 11:34 AM)  O2 Flow:  Room air CC: F/U on thyroid/pt is no longer taking Vivomo/aj   Primary Rodolphe Edmonston:  Cassandria Anger MD  CC:  F/U on thyroid/pt is no longer taking Vivomo/aj.  History of Present Illness: pt has h/o multinodular goiter.  bx was benign in 2008.  she notices the goiter, but is uncertain if it is changing in size.    Current Medications (verified): 1)  Alprazolam 0.25 Mg  Tabs (Alprazolam) .... Take 1-2  Two Times A Day Prn 2)  Vitamin D3 1000 Unit  Tabs (Cholecalciferol) .Marland Kitchen.. 1 Qd 3)  Lotrel 5-20 Mg Caps (Amlodipine Besy-Benazepril Hcl) .Marland Kitchen.. 1 By Mouth Qd 4)  Vimovo 500-20 Mg Tbec (Naproxen-Esomeprazole) .Marland Kitchen.. 1 By Mouth Once Daily - Two Times A Day Pc As Needed Pain 5)  Ferro-Bob 325 (65 Fe) Mg Tabs (Ferrous Sulfate) .Marland Kitchen.. 1 By Mouth Once Daily For Anemia 6)  Paroxetine Hcl 10 Mg Tabs (Paroxetine Hcl) .Marland Kitchen.. 1 By Mouth Once Daily For Depression 7)  Tylenol Extra Strength 500 Mg Tabs (Acetaminophen) .... As Directed  Allergies (verified): 1)  * Diazide 2)  Ultram 3)  Celebrex  Past History:  Past Medical History: Last updated: 12/15/2009 Anxiety Breast cancer, hx of Hypertension Osteoarthritis (R) CTS (05/2005) Anemia-NOS thyroid nodule - multinodular goiter Osteoporosis Hyperlipidemia Diverticulosis, colon Skin cancer R cheek 2010, hx of Colon 2005 Dr Sharlett Iles Skin cancer, hx of 2011 L cheek Dr Tonia Brooms Cataracts; floaters Scoliosis  Review of Systems       denies difficulty swallowing or breathing  Physical Exam  General:  normal appearance.   Neck:  large  multinodular goiter, left > right, corresponding to the 2010 ultrasound Additional Exam:  FastTSH                   0.63 uIU/mL   Impression & Recommendations:  Problem # 1:  GOITER, NONTOXIC MULTINODULAR (ICD-241.1) Assessment Unchanged  Other Orders: TLB-TSH (Thyroid Stimulating Hormone) [id number]) Radiology Referral (Radiology) Est. Patient Level III [id number])  Patient Instructions: 1)  return 1 year. 2)  tests are being ordered for you today.  a few days after the test(s), please call [phone] to hear your test results.  3)  most of the time, a "lumpy thyroid" will eventually become overactive.  this is usually a slow process, happening over the span of many years. 4)  even if your blood test is normal, we could consider treating this with a radioactive iodine pill, even if the blood test is not yet overactive.

## 2010-09-20 NOTE — Assessment & Plan Note (Signed)
Summary: 3 MO ROV /NWS   Vital Signs:  Patient profile:   77 year old female Height:      63 inches Weight:      138 pounds BMI:     24.53 O2 Sat:      92 % on Room air Temp:     97.5 degrees F oral Pulse rate:   93 / minute Pulse rhythm:   irregular Resp:     16 per minute BP sitting:   130 / 80  (left arm) Cuff size:   regular  Vitals Entered By: Jonathon Resides, CMA(AAMA) (March 16, 2010 1:23 PM)  O2 Flow:  Room air CC: 3 mo f/u Is Patient Diabetic? No Comments Needs Rf on paroxetine 10mg    Primary Care Provider:  Cassandria Anger MD  CC:  3 mo f/u.  History of Present Illness: F/u anemia, anxiety, HTN Better energy now  Current Medications (verified): 1)  Alprazolam 0.25 Mg  Tabs (Alprazolam) .... Take 1-2  Two Times A Day Prn 2)  Vitamin D3 1000 Unit  Tabs (Cholecalciferol) .Marland Kitchen.. 1 Qd 3)  Lotrel 5-20 Mg Caps (Amlodipine Besy-Benazepril Hcl) .Marland Kitchen.. 1 By Mouth Qd 4)  Vimovo 500-20 Mg Tbec (Naproxen-Esomeprazole) .Marland Kitchen.. 1 By Mouth Once Daily - Two Times A Day Pc As Needed Pain 5)  Ferro-Bob 325 (65 Fe) Mg Tabs (Ferrous Sulfate) .Marland Kitchen.. 1 By Mouth Once Daily For Anemia 6)  Paroxetine Hcl 10 Mg Tabs (Paroxetine Hcl) .Marland Kitchen.. 1 By Mouth Once Daily For Depression 7)  Tylenol Extra Strength 500 Mg Tabs (Acetaminophen) .... As Directed  Allergies (verified): 1)  * Diazide 2)  Ultram 3)  Celebrex  Past History:  Social History: Last updated: 04/02/2009 Former Smoker Alcohol use-no M died 2008-11-28  Past Medical History: Reviewed history from 12/15/2009 and no changes required. Anxiety Breast cancer, hx of Hypertension Osteoarthritis (R) CTS (05/2005) Anemia-NOS thyroid nodule - multinodular goiter Osteoporosis Hyperlipidemia Diverticulosis, colon Skin cancer R cheek 2008-11-28, hx of Colon 2003-11-29 Dr Sharlett Iles Skin cancer, hx of 11-28-2009 L cheek Dr Tonia Brooms Cataracts; floaters Scoliosis  Family History: Reviewed history from 08/20/2007 and no changes required. dementia -  mother DM brother with prostate cancer  Social History: Reviewed history from 04/02/2009 and no changes required. Former Smoker Alcohol use-no M died 2008/11/28  Review of Systems  The patient denies decreased hearing, chest pain, syncope, dyspnea on exertion, prolonged cough, abdominal pain, melena, and depression.    Physical Exam  General:  well developed, well nourished, in no acute distress Head:  Normocephalic and atraumatic without obvious abnormalities. No apparent alopecia or balding. Nose:  External nasal examination shows no deformity or inflammation. Nasal mucosa are pink and moist without lesions or exudates. Mouth:  Oral mucosa and oropharynx without lesions or exudates.  Teeth in good repair. Lungs:  Normal respiratory effort, chest expands symmetrically. Lungs are clear to auscultation, no crackles or wheezes. Heart:  Normal rate and regular rhythm. S1 and S2 normal without gallop, murmur, click, rub or other extra sounds. Abdomen:  Bowel sounds positive,abdomen soft and non-tender without masses, organomegaly or hernias noted. Msk:  No deformity or scoliosis noted of thoracic or lumbar spine.  R knee tender w/post-op swelling/scar Hands w/OA Extremities:  No clubbing, cyanosis, edema, or deformity noted with normal full range of motion of all joints.   Neurologic:  No cranial nerve deficits noted. Station and gait are not normal. Plantar reflexes are down-going bilaterally. DTRs are symmetrical throughout. Sensory, motor and coordinative functions appear  intact. Skin:  Aging changes Psych:  Cognition and judgment appear intact.    Impression & Recommendations:  Problem # 1:  HYPERTENSION (ICD-401.9) Assessment Improved  Her updated medication list for this problem includes:    Lotrel 5-20 Mg Caps (Amlodipine besy-benazepril hcl) .Marland Kitchen... 1 by mouth qd  BP today: 130/80 Prior BP: 132/78 (12/15/2009)  Labs Reviewed: K+: 4.0 (03/09/2010) Creat: : 1.0 (03/09/2010)    Chol: 171 (09/08/2009)   HDL: 59.30 (09/08/2009)   LDL: 97 (09/08/2009)   TG: 76.0 (09/08/2009)  Problem # 2:  ANXIETY (ICD-300.00) Assessment: Improved  Her updated medication list for this problem includes:    Alprazolam 0.25 Mg Tabs (Alprazolam) .Marland Kitchen... Take 1-2  two times a day prn    Paroxetine Hcl 10 Mg Tabs (Paroxetine hcl) .Marland Kitchen... 1 by mouth once daily for depression  Problem # 3:  OSTEOARTHRITIS (ICD-715.90) Assessment: Unchanged  Her updated medication list for this problem includes:    Vimovo 500-20 Mg Tbec (Naproxen-esomeprazole) .Marland Kitchen... 1 by mouth once daily - two times a day pc as needed pain    Tylenol Extra Strength 500 Mg Tabs (Acetaminophen) .Marland Kitchen... As directed  Problem # 4:  HYPERLIPIDEMIA (B2193296.4) Assessment: Unchanged The labs were reviewed with the patient.   Complete Medication List: 1)  Alprazolam 0.25 Mg Tabs (Alprazolam) .... Take 1-2  two times a day prn 2)  Vitamin D3 1000 Unit Tabs (Cholecalciferol) .Marland Kitchen.. 1 qd 3)  Lotrel 5-20 Mg Caps (Amlodipine besy-benazepril hcl) .Marland Kitchen.. 1 by mouth qd 4)  Vimovo 500-20 Mg Tbec (Naproxen-esomeprazole) .Marland Kitchen.. 1 by mouth once daily - two times a day pc as needed pain 5)  Ferro-bob 325 (65 Fe) Mg Tabs (Ferrous sulfate) .Marland Kitchen.. 1 by mouth once daily for anemia 6)  Paroxetine Hcl 10 Mg Tabs (Paroxetine hcl) .Marland Kitchen.. 1 by mouth once daily for depression 7)  Tylenol Extra Strength 500 Mg Tabs (Acetaminophen) .... As directed  Patient Instructions: 1)  Please schedule a follow-up appointment in 6 months well w/labs and iron/tibc v70.0  280.9.

## 2010-09-20 NOTE — Progress Notes (Signed)
Summary: iron/low Hg  Phone Note Other Incoming   Caller: pt  Summary of Call: pt states her hemog. was slightly low. Do you want her to start iron? Please Advise Initial call taken by: Ami Bullins CMA,  September 15, 2009 2:41 PM  Follow-up for Phone Call        Not now. Do the stool tests first Follow-up by: Cassandria Anger MD,  September 15, 2009 4:16 PM  Additional Follow-up for Phone Call Additional follow up Details #1::        Patient notified. Additional Follow-up by: Ernestene Mention,  September 15, 2009 5:07 PM

## 2010-09-20 NOTE — Assessment & Plan Note (Signed)
Summary: 3 mos f/u /NWS  #   Vital Signs:  Patient profile:   77 year old female Weight:      140 pounds Temp:     97.7 degrees F oral Pulse rate:   85 / minute BP sitting:   144 / 66  (left arm)  Vitals Entered By: Doralee Albino (September 15, 2009 10:31 AM) CC: f/u Is Patient Diabetic? No   Primary Care Provider:  Cassandria Anger MD  CC:  f/u.  History of Present Illness: The patient presents for a follow up of hypertension, OA, hyperlipidemia. C/o swelling under eyes at times   Preventive Screening-Counseling & Management  Alcohol-Tobacco     Smoking Status: quit  Current Medications (verified): 1)  Tenoretic 100 100-25 Mg  Tabs (Atenolol-Chlorthalidone) .... Take 1 By Mouth Qd 2)  Alprazolam 0.25 Mg  Tabs (Alprazolam) .... Take 1-2  Two Times A Day Prn 3)  Tenormin 25 Mg Tabs (Atenolol) .... Q Am 4)  Vitamin D3 1000 Unit  Tabs (Cholecalciferol) .Marland Kitchen.. 1 Qd  Allergies: 1)  * Diazide 2)  Ultram 3)  Celebrex  Past History:  Past Surgical History: Last updated: 06/14/2009 Hysterectomy XRT, chemo,surgery Appendectomy 1970/11/28 s/p left partial mastectomy Carpal Tunnel surgery Total knee replacement R 11/27/2008 Dr Para March  Social History: Last updated: 04/02/2009 Former Smoker Alcohol use-no M died 11/27/08  Past Medical History: Anxiety Breast cancer, hx of Hypertension Osteoarthritis (R) CTS (05/2005) Anemia-NOS thyroid nodule - multinodular goiter Osteoporosis Hyperlipidemia Diverticulosis, colon Skin cancer R cheek 2008-11-27, hx of Colon 28-Nov-2003 Dr Sharlett Iles  Family History: Reviewed history from 08/20/2007 and no changes required. dementia - mother DM brother with prostate cancer  Social History: Reviewed history from 04/02/2009 and no changes required. Former Smoker Alcohol use-no M died 11-27-08  Physical Exam  General:  well developed, well nourished, in no acute distress Nose:  External nasal examination shows no deformity or inflammation. Nasal  mucosa are pink and moist without lesions or exudates. Mouth:  Oral mucosa and oropharynx without lesions or exudates.  Teeth in good repair. Lungs:  Normal respiratory effort, chest expands symmetrically. Lungs are clear to auscultation, no crackles or wheezes. Heart:  Normal rate and regular rhythm. S1 and S2 normal without gallop, murmur, click, rub or other extra sounds. Abdomen:  Bowel sounds positive,abdomen soft and non-tender without masses, organomegaly or hernias noted. Msk:  No deformity or scoliosis noted of thoracic or lumbar spine.  R knee tender w/post-op swelling/scar Hands w/OA Neurologic:  No cranial nerve deficits noted. Station and gait are normal. Plantar reflexes are down-going bilaterally. DTRs are symmetrical throughout. Sensory, motor and coordinative functions appear intact. Skin:  Aging changes Psych:  Cognition and judgment appear intact.    Impression & Recommendations:  Problem # 1:  ANEMIA-NOS (ICD-285.9) post-op vs other Assessment New  The following medications were removed from the medication list:    Vitamin B-12 1000 Mcg Tabs (Cyanocobalamin) .Marland Kitchen... Take 1 by mouth qd  Orders: [facility] Hemoccult Cards #3 (take home) (Hem cards #3)  Problem # 2:  HYPERTENSION (ICD-401.9)  The following medications were removed from the medication list:    Lotrel 5-20 Mg Caps (Amlodipine besy-benazepril hcl) .Marland Kitchen... Take 1 by mouth qd    Tenoretic 100 100-25 Mg Tabs (Atenolol-chlorthalidone) .Marland Kitchen... Take 1 by mouth qd    Tenormin 25 Mg Tabs (Atenolol) ..... Q am Her updated medication list for this problem includes:    Lotrel 5-20 Mg Caps (Amlodipine besy-benazepril hcl) .Marland KitchenMarland KitchenMarland KitchenMarland Kitchen 1  by mouth qd    Furosemide 40 Mg Tabs (Furosemide) .Marland Kitchen... 1 or 1/2 by mouth q am ( a water pill) as needed swelling  Problem # 3:  OSTEOARTHRITIS (ICD-715.90)  The following medications were removed from the medication list:    Darvocet-n 100 100-650 Mg Tabs (Propoxyphene n-apap) .Marland Kitchen... 1 by  mouth two times a day as needed pain    Adult Aspirin Low Strength 81 Mg Tbdp (Aspirin) .Marland Kitchen... Take 1 by mouth qd  Complete Medication List: 1)  Alprazolam 0.25 Mg Tabs (Alprazolam) .... Take 1-2  two times a day prn 2)  Vitamin D3 1000 Unit Tabs (Cholecalciferol) .Marland Kitchen.. 1 qd 3)  Lotrel 5-20 Mg Caps (Amlodipine besy-benazepril hcl) .Marland Kitchen.. 1 by mouth qd 4)  Nortriptyline Hcl 10 Mg Caps (Nortriptyline hcl) .Marland Kitchen.. 1-2 by mouth qhs 5)  Furosemide 40 Mg Tabs (Furosemide) .Marland Kitchen.. 1 or 1/2 by mouth q am ( a water pill) as needed swelling  Patient Instructions: 1)  Please schedule a follow-up appointment in 3 months. 2)  BMP prior to visit, ICD-9: 3)  CBC w/ Diff prior to visit, ICD-9: 401.1 4)  iron/tibc  280.9 Prescriptions: FUROSEMIDE 40 MG TABS (FUROSEMIDE) 1 or 1/2 by mouth q am ( a water pill) as needed swelling  #30 x 12   Entered and Authorized by:   Cassandria Anger MD   Signed by:   Cassandria Anger MD on 09/15/2009   Method used:   Print then Give to Patient   RxID:   [id number] NORTRIPTYLINE HCL 10 MG CAPS (NORTRIPTYLINE HCL) 1-2 by mouth qhs  #60 x 6   Entered and Authorized by:   Cassandria Anger MD   Signed by:   Cassandria Anger MD on 09/15/2009   Method used:   Print then Give to Patient   RxID:   [id number] LOTREL 5-20 MG CAPS (AMLODIPINE BESY-BENAZEPRIL HCL) 1 by mouth qd  #30 x 12   Entered and Authorized by:   Cassandria Anger MD   Signed by:   Cassandria Anger MD on 09/15/2009   Method used:   Print then Give to Patient   RxID:   [id number]

## 2010-09-20 NOTE — Assessment & Plan Note (Signed)
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 Take 1-2  Two Times A Day Prn 2)  Vitamin D3 1000 Unit  Tabs (Cholecalciferol) .Marland Kitchen.. 1 Qd 3)  Lotrel 5-20 Mg Caps (Amlodipine Besy-Benazepril Hcl) .Marland Kitchen.. 1 By Mouth Qd  Allergies (verified): 1)  * Diazide 2)  Ultram 3)  Celebrex  Past History:  Social History: Last updated: 04/02/2009 Former Smoker Alcohol use-no M died Dec 06, 2008  Past Medical History: Anxiety Breast cancer, hx of Hypertension Osteoarthritis (R) CTS (05/2005) Anemia-NOS thyroid nodule - multinodular goiter Osteoporosis Hyperlipidemia Diverticulosis, colon Skin cancer R cheek Dec 06, 2008, hx of Colon 2003-12-07 Dr Sharlett Iles Skin cancer, hx of December 06, 2009 L cheek Dr Tonia Brooms Cataracts; floaters Scoliosis  Past Surgical History: Hysterectomy XRT, chemo,surgery Appendectomy 12/07/1970 s/p left partial mastectomy Carpal Tunnel surgery Total knee replacement R December 06, 2008 Dr Para March Cataract extraction Skin Ca face removal Dr Bing Plume  Review of Systems  The patient denies fever, chest pain, syncope, dyspnea on exertion, and abdominal pain.    Physical Exam  General:  well developed, well nourished, in no acute distress Ears:  External ear exam shows no significant lesions or deformities.  Otoscopic examination reveals clear canals, tympanic membranes are intact bilaterally without bulging,  retraction, inflammation or discharge. Hearing is grossly normal bilaterally. Nose:  External nasal examination shows no deformity or inflammation. Nasal mucosa are pink and moist without lesions or exudates. Mouth:  Oral mucosa and oropharynx without lesions or exudates.  Teeth in good repair. Lungs:  Normal respiratory effort, chest expands symmetrically. Lungs are clear to auscultation, no crackles or wheezes. Heart:  Normal rate and regular rhythm. S1 and S2 normal without gallop, murmur, click, rub or other extra sounds. Abdomen:  Bowel sounds positive,abdomen soft and non-tender without masses, organomegaly or hernias noted. Msk:  No deformity or scoliosis noted of thoracic or lumbar spine.  R knee tender w/post-op swelling/scar Hands w/OA Extremities:  No clubbing, cyanosis, edema, or deformity noted with normal full range of motion of all joints.   Neurologic:  No cranial nerve deficits noted. Station and gait are not normal. Plantar reflexes are down-going bilaterally. DTRs are symmetrical throughout. Sensory, motor and coordinative functions appear intact. Skin:  Aging changes Psych:  Cognition and judgment appear intact.    Impression & Recommendations:  Problem # 1:  HYPERTENSION (ICD-401.9) Assessment Improved  The following medications were removed from the medication list:    Furosemide 40 Mg Tabs (Furosemide) .Marland Kitchen... 1 or 1/2 by mouth q am ( a water pill) as needed swelling Her updated medication list for this problem includes:    Lotrel 5-20 Mg Caps (Amlodipine besy-benazepril hcl) .Marland Kitchen... 1 by mouth qd  BP today: 132/78 Prior BP: 144/66 (09/15/2009)  Labs Reviewed: K+: 4.6 (12/08/2009) Creat: : 1.1 (12/08/2009)   Chol: 171 (09/08/2009)   HDL: 59.30 (09/08/2009)   LDL: 97 (09/08/2009)   TG: 76.0 (09/08/2009)  Problem # 2:  OSTEOARTHRITIS (ICD-715.90) Assessment: Deteriorated  Her updated medication list for this problem includes:    Vimovo 500-20 Mg Tbec  (Naproxen-esomeprazole) .Marland Kitchen... 1 by mouth once daily - two times a day pc as needed pain  Problem # 3:  ANXIETY (ICD-300.00) Assessment: Unchanged  The following medications were removed from the medication list:    Nortriptyline Hcl 10 Mg Caps (Nortriptyline hcl) .Marland Kitchen... 1-2 by mouth qhs Her updated medication list for this problem includes:    Alprazolam 0.25 Mg Tabs (Alprazolam) .Marland Kitchen... Take 1-2  two times a day prn    Paroxetine Hcl 10 Mg Tabs (Paroxetine hcl) .Marland Kitchen... 1 by mouth once daily for depression  Problem # 4:  OSTEOPOROSIS (ICD-733.00) Assessment: Unchanged Vit D  Problem # 5:  Fall Assessment: New See "Patient Instructions".   Complete Medication List: 1)  Alprazolam 0.25 Mg Tabs (Alprazolam) .... Take 1-2  two times a day prn 2)  Vitamin D3 1000 Unit Tabs (Cholecalciferol) .Marland Kitchen.. 1 qd 3)  Lotrel 5-20 Mg Caps (Amlodipine besy-benazepril hcl) .Marland Kitchen.. 1 by mouth qd 4)  Vimovo 500-20 Mg Tbec (Naproxen-esomeprazole) .Marland Kitchen.. 1 by mouth once daily - two times a day pc as needed pain 5)  Ferro-bob 325 (65 Fe) Mg Tabs (Ferrous sulfate) .Marland Kitchen.. 1 by mouth once daily for anemia 6)  Paroxetine Hcl 10 Mg Tabs (Paroxetine hcl) .Marland Kitchen.. 1 by mouth once daily for depression  Patient Instructions: 1)  Use stretching and balance exercises that I have provided (15 min. or longer every day) 2)  Start taking a chair yoga class 3)  Please schedule a follow-up appointment in 3 months. 4)  BMP prior to visit, ICD-9: 5)  CBC w/ Diff prior to visit, ICD-9: 280.9 Prescriptions: PAROXETINE HCL 10 MG TABS (PAROXETINE HCL) 1 by mouth once daily for depression  #30 x 6   Entered and Authorized by:   Cassandria Anger MD   Signed by:   Cassandria Anger MD on 12/15/2009   Method used:   Print then Give to Patient   RxID:   [id number] FERRO-BOB 325 (65 FE) MG TABS (FERROUS SULFATE) 1 by mouth once daily for anemia  #30 x 6   Entered and Authorized by:   Cassandria Anger MD   Signed by:   Cassandria Anger MD on 12/15/2009   Method used:   Print then Give to Patient   RxID:   [id number] VIMOVO 500-20 MG TBEC (NAPROXEN-ESOMEPRAZOLE) 1 by mouth once daily - two times a day pc as needed pain  #60 x 3   Entered and Authorized by:   Cassandria Anger MD   Signed by:   Cassandria Anger MD on 12/15/2009   Method used:   Print then Give to Patient   RxID:   [id number]

## 2010-09-20 NOTE — Progress Notes (Signed)
Summary: APPT/COUGH  ---- Converted from flag ---- ---- 11/29/2009 4:35 PM, Mia Contreras, CMA wrote: Pt left vm req rx, She needs office visit w/someone for cough. Please schedule  THANK YOU SO VERY MUCH! ------------------------------ Gave pt 12/01/09 @ 11a w/Dr Jenny Reichmann  phone

## 2010-09-20 NOTE — Progress Notes (Signed)
Summary: Referral  Phone Note Call from Patient Call back at Home Phone [phone]   Caller: Patient Summary of Call: Pt called to check status of referral for Thyroid uptake and scan. Pt was very upset when she called, please advise? Initial call taken by: Crissie Sickles, [place],  April 19, 2010 3:34 PM  Follow-up for Phone Call        i forwarded to pcc Follow-up by: Donavan Foil MD,  April 19, 2010 4:04 PM  Additional Follow-up for Phone Call Additional follow up Details #1::        Appt scheduled for sept 15-16@8 :00am -wl pt informed of this appt Nonah Mattes  April 20, 2010 11:57 AM  Additional Follow-up by: Nonah Mattes,  April 20, 2010 2:23 PM

## 2010-09-20 NOTE — Assessment & Plan Note (Signed)
Summary: chest cold,cd   Vital Signs:  Patient profile:   77 year old female Height:      63 inches Weight:      140 pounds BMI:     24.89 O2 Sat:      97 % on Room air Temp:     97.3 degrees F oral Pulse rate:   96 / minute BP sitting:   120 / 62  (right arm) Cuff size:   regular  Vitals Entered By: Glenda Chroman (May 23, 2010 4:09 PM)  O2 Flow:  Room air CC: pt has a cold with cough. producing clear phlegm. /cp sma   Primary Care Provider:  Cassandria Anger MD  CC:  pt has a cold with cough. producing clear phlegm. /cp sma.  History of Present Illness: The patient presents with complaints of sore throat, fever, cough, sinus congestion and drainge of several days duration. Not better with OTC meds.. Muscle aches are present.  The mucus is colored.   Current Medications (verified): 1)  Alprazolam 0.25 Mg  Tabs (Alprazolam) .... Take 1-2  Two Times A Day Prn 2)  Vitamin D3 1000 Unit  Tabs (Cholecalciferol) .Marland Kitchen.. 1 Qd 3)  Lotrel 5-20 Mg Caps (Amlodipine Besy-Benazepril Hcl) .Marland Kitchen.. 1 By Mouth Qd 4)  Vimovo 500-20 Mg Tbec (Naproxen-Esomeprazole) .Marland Kitchen.. 1 By Mouth Once Daily - Two Times A Day Pc As Needed Pain 5)  Ferro-Bob 325 (65 Fe) Mg Tabs (Ferrous Sulfate) .Marland Kitchen.. 1 By Mouth Once Daily For Anemia 6)  Paroxetine Hcl 10 Mg Tabs (Paroxetine Hcl) .Marland Kitchen.. 1 By Mouth Once Daily For Depression 7)  Tylenol Extra Strength 500 Mg Tabs (Acetaminophen) .... As Directed 8)  Aspirin 81 Mg Tabs (Aspirin) .Marland Kitchen.. 1 Tablet Daily  Allergies (verified): 1)  * Diazide 2)  Ultram 3)  Celebrex  Past History:  Past Medical History: Anxiety Breast cancer, hx of Hypertension Osteoarthritis (R) CTS (05/2005) Anemia-NOS thyroid nodule - multinodular goiter Osteoporosis Hyperlipidemia Diverticulosis, colon Skin cancer R cheek 2010, hx of Colon 2005 Dr Sharlett Iles Skin cancer, hx of 2011 L cheek Dr Tonia Brooms Cataracts; floaters Scoliosis Hyperthyroidism 2011 Dr [name]  Physical  Exam  General:  well developed, well nourished, in no acute distress Mouth:  Erythematous throat and intranasal mucosa c/w URI  Lungs:  Normal respiratory effort, chest expands symmetrically. Lungs are clear to auscultation, no crackles or wheezes. Heart:  Normal rate and regular rhythm. S1 and S2 normal without gallop, murmur, click, rub or other extra sounds. Abdomen:  Bowel sounds positive,abdomen soft and non-tender without masses, organomegaly or hernias noted. Msk:  No deformity or scoliosis noted of thoracic or lumbar spine.  R knee tender w/post-op swelling/scar Hands w/OA Neurologic:  No cranial nerve deficits noted. Station and gait are not normal. Plantar reflexes are down-going bilaterally. DTRs are symmetrical throughout. Sensory, motor and coordinative functions appear intact. Skin:  Aging changes   Impression & Recommendations:  Problem # 1:  UPPER RESPIRATORY INFECTION, ACUTE (ICD-465.9) Assessment New Zpac  Complete Medication List: 1)  Alprazolam 0.25 Mg Tabs (Alprazolam) .... Take 1-2  two times a day prn 2)  Vitamin D3 1000 Unit Tabs (Cholecalciferol) .Marland Kitchen.. 1 qd 3)  Lotrel 5-20 Mg Caps (Amlodipine besy-benazepril hcl) .Marland Kitchen.. 1 by mouth qd 4)  Vimovo 500-20 Mg Tbec (Naproxen-esomeprazole) .Marland Kitchen.. 1 by mouth once daily - two times a day pc as needed pain 5)  Ferro-bob 325 (65 Fe) Mg Tabs (Ferrous sulfate) .Marland Kitchen.. 1 by mouth once daily for anemia 6)  Paroxetine Hcl 10 Mg Tabs (Paroxetine hcl) .Marland Kitchen.. 1 by mouth once daily for depression 7)  Tylenol Extra Strength 500 Mg Tabs (Acetaminophen) .... As directed 8)  Aspirin 81 Mg Tabs (Aspirin) .Marland Kitchen.. 1 tablet daily 9)  Zithromax Z-pak 250 Mg Tabs (Azithromycin) .... As dirrected  Patient Instructions: 1)  Please schedule a follow-up appointment in 3 months well w/labs. 2)  Call if you are not better in a reasonable amount of time or if worse.  Prescriptions: ZITHROMAX Z-PAK 250 MG TABS (AZITHROMYCIN) as dirrected  #1 x 0   Entered  and Authorized by:   Cassandria Anger MD   Signed by:   Cassandria Anger MD on 05/23/2010   Method used:   Electronically to        [facility] [phone]* (retail)       [address]       [place], [place]  [address]       Ph: [phone] or [phone]       Fax: [phone]   RxID:   [id number]

## 2010-09-20 NOTE — Progress Notes (Signed)
Summary: Alprazolam refill  Phone Note Refill Request Message from:  Fax from Pharmacy on December 06, 2009 3:19 PM  Refills Requested: Medication #1:  ALPRAZOLAM 0.25 MG  TABS take 1-2  two times a day prn Next Appointment Scheduled: 12-15-09 Initial call taken by: Ernestene Mention,  December 06, 2009 3:19 PM  Follow-up for Phone Call        ok 6 months  Follow-up by: Cassandria Anger MD,  December 06, 2009 5:29 PM    Prescriptions: ALPRAZOLAM 0.25 MG  TABS (ALPRAZOLAM) take 1-2  two times a day prn  #60 x 6   Entered by:   Ernestene Mention   Authorized by:   Cassandria Anger MD   Signed by:   Ernestene Mention on 12/07/2009   Method used:   Telephoned to ...       [facility] [address]* (retail)       [address]       [place], [place]  [address]       Ph: [phone] or [phone]       Fax: [phone]   RxID:   [id number]

## 2010-09-20 NOTE — Progress Notes (Signed)
Summary: iodine pill  ---- Converted from flag ---- ---- 04/14/2010 8:49 AM, Denice Paradise wrote: Dr [name];  Pt (Mia Contreras) wants to pursue the radioactive iodine pill, that you mentioned to her yesterday. Should she have done now? Pt can do Wed or Thur's...    [id number] cell---Mia Contreras ------------------------------ i ordered.   sean ellison, md      Additional Follow-up for Phone Call Additional follow up Details #2::    left message on VM for pt that ordered was placed and pt will be contacted once an appointment is made Follow-up by: Rebeca Alert MA,  April 15, 2010 8:41 AM

## 2010-09-20 NOTE — Progress Notes (Signed)
Summary: ALT BP MED  Phone Note Call from Patient Call back at Home Phone [phone]   Summary of Call: Pt's insurance has increased cost of amlodipine/benazepril - from 7 a mth to 92 dollars. Alt are amlodipine besylate OR benazepril hcl. Are these an option for pt?    [facility] Initial call taken by: Charlsie Quest, [place],  July 19, 2010 5:17 PM  Follow-up for Phone Call        ok change to [facility] and benazepr separately Follow-up by: Cassandria Anger MD,  July 19, 2010 6:16 PM  Additional Follow-up for Phone Call Additional follow up Details #1::        Pt informed  Additional Follow-up by: Charlsie Quest, CMA,  July 20, 2010 10:42 AM    New/Updated Medications: BENAZEPRIL HCL 20 MG TABS (BENAZEPRIL HCL) 1 by mouth qd AMLODIPINE BESYLATE 5 MG TABS (AMLODIPINE BESYLATE) 1 by mouth qd Prescriptions: AMLODIPINE BESYLATE 5 MG TABS (AMLODIPINE BESYLATE) 1 by mouth qd  #90 x 3   Entered and Authorized by:   Cassandria Anger MD   Signed by:   Charlsie Quest, CMA on 07/20/2010   Method used:   Electronically to        [facility] [phone]* (retail)       [address]       [place], [place]  [address]       Ph: [phone] or [phone]       Fax: [phone]   RxID:   [id number] BENAZEPRIL HCL 20 MG TABS (BENAZEPRIL HCL) 1 by mouth qd  #90 x 3   Entered and Authorized by:   Cassandria Anger MD   Signed by:   Charlsie Quest, CMA on 07/20/2010   Method used:   Electronically to        [facility] [phone]* (retail)       [address]       [place], [place]  [address]       Ph: [phone] or [phone]       Fax: [phone]   RxID:   [id number]

## 2010-09-21 ENCOUNTER — Encounter: Payer: Self-pay | Admitting: Internal Medicine

## 2010-09-21 ENCOUNTER — Ambulatory Visit (INDEPENDENT_AMBULATORY_CARE_PROVIDER_SITE_OTHER): Payer: Medicare Other | Admitting: Internal Medicine

## 2010-09-21 ENCOUNTER — Other Ambulatory Visit: Payer: Self-pay | Admitting: Internal Medicine

## 2010-09-21 ENCOUNTER — Ambulatory Visit: Admit: 2010-09-21 | Payer: Self-pay | Admitting: Internal Medicine

## 2010-09-21 ENCOUNTER — Other Ambulatory Visit: Payer: Medicare Other

## 2010-09-21 DIAGNOSIS — D485 Neoplasm of uncertain behavior of skin: Secondary | ICD-10-CM | POA: Insufficient documentation

## 2010-09-21 DIAGNOSIS — Z0000 Encounter for general adult medical examination without abnormal findings: Secondary | ICD-10-CM

## 2010-09-21 DIAGNOSIS — I491 Atrial premature depolarization: Secondary | ICD-10-CM | POA: Insufficient documentation

## 2010-09-21 DIAGNOSIS — E042 Nontoxic multinodular goiter: Secondary | ICD-10-CM

## 2010-09-21 DIAGNOSIS — N3090 Cystitis, unspecified without hematuria: Secondary | ICD-10-CM

## 2010-09-21 DIAGNOSIS — E785 Hyperlipidemia, unspecified: Secondary | ICD-10-CM

## 2010-09-21 HISTORY — DX: Atrial premature depolarization: I49.1

## 2010-09-21 LAB — URINALYSIS, ROUTINE W REFLEX MICROSCOPIC
Specific Gravity, Urine: 1.01 (ref 1.000–1.030)
Urine Glucose: NEGATIVE
Urobilinogen, UA: 0.2 (ref 0.0–1.0)
pH: 6 (ref 5.0–8.0)

## 2010-09-22 NOTE — Miscellaneous (Signed)
Summary: Cipro  Medications Added CIPRO 250 MG TABS (CIPROFLOXACIN HCL) 1 by mouth two times a day       Clinical Lists Changes  Medications: Added new medication of CIPRO 250 MG TABS (CIPROFLOXACIN HCL) 1 by mouth two times a day - Signed Rx of CIPRO 250 MG TABS (CIPROFLOXACIN HCL) 1 by mouth two times a day;  #10 x 0;  Signed;  Entered by: Jonathon Resides, CMA(AAMA);  Authorized by: Cassandria Anger MD;  Method used: Electronically to [facility] [phone]*, [address], [place], [place]  [address], Ph: [phone] or [phone], Fax: [phone]    Prescriptions: CIPRO 250 MG TABS (CIPROFLOXACIN HCL) 1 by mouth two times a day  #10 x 0   Entered by:   Jonathon Resides, CMA(AAMA)   Authorized by:   Cassandria Anger MD   Signed by:   Jonathon Resides, CMA(AAMA) on 09/16/2010   Method used:   Electronically to        [facility] (retail)       [address]       [place], [place]  [address]       Ph: [phone] or [phone]       Fax: [phone]   RxID:   [id number]

## 2010-09-28 NOTE — Assessment & Plan Note (Signed)
Summary: 6 MO ROV NWS #   Vital Signs:  Patient profile:   77 year old female Height:      63 inches Weight:      141 pounds BMI:     25.07 Temp:     97.4 degrees F oral Pulse rate:   80 / minute Pulse rhythm:   irregular Resp:     16 per minute BP sitting:   144 / 80  (right arm) Cuff size:   regular  Vitals Entered By: Jonathon Resides, CMA(AAMA) (September 21, 2010 1:17 PM) CC: MWV Is Patient Diabetic? No Comments pt is not taking Vivomo or Paroxetine   Primary Care Provider:  Cassandria Anger MD  CC:  MWV.  History of Present Illness: The patient presents for a preventive health examination  Patient past medical history, social history, and family history reviewed in detail no significant changes.  Patient is physically active. Depression is negative and mood is good. Hearing is normal, and able to perform activities of daily living. Risk of falling is negligible and home safety has been reviewed and is appropriate. Patient has normal height, weight, and a good visual acuity. Patient has been counseled on age-appropriate routine health concerns for screening and prevention. Education, counseling done. Cognition is nl.  Preventive Screening-Counseling & Management  Alcohol-Tobacco     Alcohol drinks/day: <1     Alcohol type: wine     Smoking Status: quit     Year Quit: 1981  Caffeine-Diet-Exercise     Caffeine use/day: 3     Diet Counseling: not indicated; diet is assessed to be healthy     Does Patient Exercise: yes     Depression Counseling: not indicated; screening negative for depression  Hep-HIV-STD-Contraception     Hepatitis Risk: no risk noted     Dental Visit-last 6 months yes     SBE monthly: no     Sun Exposure-Excessive: no  Safety-Violence-Falls     Seat Belt Use: yes     Helmet Use: n/a     Violence in the Home: no risk noted     Sexual Abuse: no     Fall Risk: none  Current Medications (verified): 1)  Alprazolam 0.25 Mg  Tabs (Alprazolam)  .... Take 1-2  Two Times A Day Prn 2)  Vitamin D3 1000 Unit  Tabs (Cholecalciferol) .Marland Kitchen.. 1 Qd 3)  Vimovo 500-20 Mg Tbec (Naproxen-Esomeprazole) .Marland Kitchen.. 1 By Mouth Once Daily - Two Times A Day Pc As Needed Pain 4)  Ferro-Bob 325 (65 Fe) Mg Tabs (Ferrous Sulfate) .Marland Kitchen.. 1 By Mouth Once Daily For Anemia 5)  Paroxetine Hcl 10 Mg Tabs (Paroxetine Hcl) .Marland Kitchen.. 1 By Mouth Once Daily For Depression 6)  Tylenol Extra Strength 500 Mg Tabs (Acetaminophen) .... As Directed 7)  Aspirin 81 Mg Tabs (Aspirin) .Marland Kitchen.. 1 Tablet Daily 8)  Benazepril Hcl 20 Mg Tabs (Benazepril Hcl) .Marland Kitchen.. 1 By Mouth Qd 9)  Amlodipine Besylate 5 Mg Tabs (Amlodipine Besylate) .Marland Kitchen.. 1 By Mouth Qd  Allergies (verified): 1)  * Diazide 2)  Ultram 3)  Celebrex  Past History:  Past Medical History: Last updated: 05/23/2010 Anxiety Breast cancer, hx of Hypertension Osteoarthritis (R) CTS (05/2005) Anemia-NOS thyroid nodule - multinodular goiter Osteoporosis Hyperlipidemia Diverticulosis, colon Skin cancer R cheek 2010, hx of Colon 2005 Dr Sharlett Iles Skin cancer, hx of 2011 L cheek Dr Tonia Brooms Cataracts; floaters Scoliosis Hyperthyroidism 2011 Dr [name]  Past Surgical History: Last updated: 12/15/2009 Hysterectomy XRT, chemo,surgery Appendectomy 1972 s/p  left partial mastectomy Carpal Tunnel surgery Total knee replacement R 11/29/2008 Dr Para March Cataract extraction Skin Ca face removal Dr Bing Plume  Family History: Last updated: 08/20/2007 dementia - mother DM brother with prostate cancer  Social History: Last updated: 04/02/2009 Former Smoker Alcohol use-no M died 11-29-2008  Social History: Dental Care w/in 6 mos.:  yes Sun Exposure-Excessive:  no Fall Risk:  none Seat Belt Use:  yes Caffeine use/day:  3 Does Patient Exercise:  yes Hepatitis Risk:  no risk noted  Review of Systems  The patient denies anorexia, fever, weight loss, weight gain, vision loss, decreased hearing, hoarseness, chest pain, syncope, dyspnea on  exertion, peripheral edema, prolonged cough, headaches, hemoptysis, abdominal pain, melena, hematochezia, severe indigestion/heartburn, hematuria, incontinence, genital sores, muscle weakness, suspicious skin lesions, transient blindness, difficulty walking, depression, unusual weight change, abnormal bleeding, enlarged lymph nodes, angioedema, and breast masses.    Physical Exam  General:  well developed, well nourished, in no acute distress Head:  Normocephalic and atraumatic without obvious abnormalities. No apparent alopecia or balding. Eyes:  No corneal or conjunctival inflammation noted. EOMI. Perrla.  Ears:  External ear exam shows no significant lesions or deformities.  Otoscopic examination reveals clear canals, tympanic membranes are intact bilaterally without bulging, retraction, inflammation or discharge. Hearing is grossly normal bilaterally. Nose:  External nasal examination shows no deformity or inflammation. Nasal mucosa are pink and moist without lesions or exudates. Mouth:  Erythematous throat and intranasal mucosa c/w URI  Neck:  large multinodular goiter, right > left, corresponding to the 30-Nov-2006 [name] Lungs:  Normal respiratory effort, chest expands symmetrically. Lungs are clear to auscultation, no crackles or wheezes. Heart:  Normal rate and regular rhythm. S1 and S2 normal without gallop, murmur, click, rub or other extra sounds. Abdomen:  Bowel sounds positive,abdomen soft and non-tender without masses, organomegaly or hernias noted. Msk:  No deformity or scoliosis noted of thoracic or lumbar spine.  R knee tender w/post-op swelling/scar Hands w/OA Extremities:  No clubbing, cyanosis, edema, or deformity noted with normal full range of motion of all joints.   Neurologic:  No cranial nerve deficits noted. Station and gait are not normal. Plantar reflexes are down-going bilaterally. DTRs are symmetrical throughout. Sensory, motor and coordinative functions appear intact. Skin:   Aging changes AK on L shoulder ond a growth on R ear Psych:  Cognition and judgment appear intact.    Impression & Recommendations:  Problem # 1:  HEALTH MAINTENANCE EXAM (ICD-V70.0) Assessment New  She just have a mammo. Dr Margot Chimes in 6 months  Overall doing well, age appropriate education and counseling updated and referral for appropriate preventive services done unless declined, immunizations up to date or declined, diet counseling done if overweight, urged to quit smoking if smokes, most recent labs reviewed and current ordered if appropriate, ecg reviewed or declined (interpretation per ECG scanned in the EMR if done); information regarding Medicare Preventation requirements given if appropriate.  The labs were reviewed with the patient.  I have personally reviewed the Medicare Annual Wellness questionnaire and have noted 1.   The patient's medical and social history 2.   Their use of alcohol, tobacco or illicit drugs 3.   Their current medications and supplements 4.   The patient's functional ability including ADL's, fall risks, home safety risks and hearing or visual             impairment. 5.   Diet and physical activities 6.   Evidence for depression or mood disorders  The  patients weight, height, BMI and visual acuity have been recorded in the chart I have made referrals, counseling and provided education to the patient based review of the above and I have provided the pt with a written personalized care plan for preventive services.  The labs were reviewed with the patient.   Orders: Medicare -1st Annual Wellness Visit [phone]) EKG w/ Interpretation (93000)OK  Problem # 2:  HYPERTENSION (ICD-401.9) Assessment: Deteriorated  Her updated medication list for this problem includes:    Benazepril Hcl 20 Mg Tabs (Benazepril hcl) .Marland Kitchen... 1 by mouth qd    Amlodipine Besylate 5 Mg Tabs (Amlodipine besylate) .Marland Kitchen... 1 by mouth qd  BP today: 144/80 Prior BP: 120/62  (05/23/2010)  Labs Reviewed: K+: 4.7 (09/14/2010) Creat: : 1.1 (09/14/2010)   Chol: 183 (09/14/2010)   HDL: 62.30 (09/14/2010)   LDL: 110 (09/14/2010)   TG: 55.0 (09/14/2010)  Problem # 3:  HYPERLIPIDEMIA (ICD-272.4) Assessment: Unchanged  Labs Reviewed: SGOT: 27 (09/14/2010)   SGPT: 18 (09/14/2010)   HDL:62.30 (09/14/2010), 59.30 (09/08/2009)  LDL:110 (09/14/2010), 97 (09/08/2009)  Chol:183 (09/14/2010), 171 (09/08/2009)  Trig:55.0 (09/14/2010), 76.0 (09/08/2009)  Problem # 4:  ANXIETY (ICD-300.00) Assessment: Improved  The following medications were removed from the medication list:    Paroxetine Hcl 10 Mg Tabs (Paroxetine hcl) .Marland Kitchen... 1 by mouth once daily for depression Her updated medication list for this problem includes:    Alprazolam 0.25 Mg Tabs (Alprazolam) .Marland Kitchen... Take 1-2  two times a day prn  Problem # 5:  CYSTITIS (N7837765.9) Assessment: New The labs were reviewed with the patient.  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 appt with Dr Tonia Brooms is is pending   Problem # 7:  PREMATURE ATRIAL CONTRACTIONS (ICD-427.61) Assessment: Unchanged  Her updated medication list for this problem includes:    Aspirin 81 Mg Tabs (Aspirin) .Marland Kitchen... 1 tablet daily  Complete Medication List: 1)  Alprazolam 0.25 Mg Tabs (Alprazolam) .... Take 1-2  two times a day prn 2)  Vitamin D3 1000 Unit Tabs (Cholecalciferol) .Marland Kitchen.. 1 qd 3)  Ferro-bob 325 (65 Fe) Mg Tabs (Ferrous sulfate) .Marland Kitchen.. 1 by mouth once daily for anemia 4)  Tylenol Extra Strength 500 Mg Tabs (Acetaminophen) .... As directed 5)  Aspirin 81 Mg Tabs (Aspirin) .Marland Kitchen.. 1 tablet daily 6)  Benazepril Hcl 20 Mg Tabs (Benazepril hcl) .Marland Kitchen.. 1 by mouth qd 7)  Amlodipine Besylate 5 Mg Tabs (Amlodipine besylate) .Marland Kitchen.. 1 by mouth qd  Patient Instructions: 1)  Nl BP is < 130/85 2)  Please schedule a follow-up appointment in 6 months. 3)  BMP  prior to visit, ICD-9: 4)  Lipid Panel prior to visit, ICD-9: 5)  Urine Microalbumin prior to visit, ICD-9:272.0 401.1    Prescriptions: ALPRAZOLAM 0.25 MG  TABS (ALPRAZOLAM) take 1-2  two times a day prn  #60 x 6   Entered and Authorized by:   Cassandria Anger MD   Signed by:   Cassandria Anger MD on 09/21/2010   Method used:   Print then Give to Patient   RxID:   [id number]    Orders Added: 1)  TLB-Udip ONLY [81003-UDIP] 2)  Medicare -1st Annual Wellness Visit [id number] 3)  EKG w/ Interpretation [93000] 4)  Est. Patient Level IV [id number]   Immunization History:  Influenza Immunization History:    Influenza:  historical (05/12/2010)   Immunization History:  Influenza Immunization History:  Influenza:  Historical (05/12/2010)

## 2010-10-19 ENCOUNTER — Other Ambulatory Visit: Payer: Self-pay | Admitting: Dermatology

## 2010-11-25 LAB — CBC
HCT: 29 % — ABNORMAL LOW (ref 36.0–46.0)
HCT: 30.4 % — ABNORMAL LOW (ref 36.0–46.0)
Hemoglobin: 10.3 g/dL — ABNORMAL LOW (ref 12.0–15.0)
Hemoglobin: 10.5 g/dL — ABNORMAL LOW (ref 12.0–15.0)
MCHC: 34.1 g/dL (ref 30.0–36.0)
MCHC: 34.1 g/dL (ref 30.0–36.0)
MCHC: 34.6 g/dL (ref 30.0–36.0)
MCV: 94.4 fL (ref 78.0–100.0)
MCV: 95.2 fL (ref 78.0–100.0)
Platelets: 168 10*3/uL (ref 150–400)
Platelets: 326 10*3/uL (ref 150–400)
RBC: 3.2 MIL/uL — ABNORMAL LOW (ref 3.87–5.11)
RDW: 14.2 % (ref 11.5–15.5)
RDW: 14.3 % (ref 11.5–15.5)
RDW: 14.6 % (ref 11.5–15.5)
WBC: 12.7 10*3/uL — ABNORMAL HIGH (ref 4.0–10.5)
WBC: 7.9 10*3/uL (ref 4.0–10.5)

## 2010-11-25 LAB — DIFFERENTIAL
Basophils Absolute: 0.1 10*3/uL (ref 0.0–0.1)
Basophils Relative: 1 % (ref 0–1)
Eosinophils Relative: 2 % (ref 0–5)
Monocytes Absolute: 0.5 10*3/uL (ref 0.1–1.0)
Monocytes Relative: 6 % (ref 3–12)
Neutro Abs: 5.8 10*3/uL (ref 1.7–7.7)

## 2010-11-25 LAB — COMPREHENSIVE METABOLIC PANEL
AST: 39 U/L — ABNORMAL HIGH (ref 0–37)
Albumin: 4.3 g/dL (ref 3.5–5.2)
Alkaline Phosphatase: 98 U/L (ref 39–117)
BUN: 19 mg/dL (ref 6–23)
Chloride: 101 mEq/L (ref 96–112)
GFR calc Af Amer: 54 mL/min — ABNORMAL LOW (ref >60)
Potassium: 3.9 mEq/L (ref 3.5–5.1)
Sodium: 139 mEq/L (ref 135–145)
Total Bilirubin: 0.9 mg/dL (ref 0.3–1.2)
Total Protein: 7.2 g/dL (ref 6.0–8.3)

## 2010-11-25 LAB — URINALYSIS, ROUTINE W REFLEX MICROSCOPIC
Bilirubin Urine: NEGATIVE
Hgb urine dipstick: NEGATIVE
Ketones, ur: NEGATIVE mg/dL
Specific Gravity, Urine: 1.01 (ref 1.005–1.030)
Urobilinogen, UA: 0.2 mg/dL (ref 0.0–1.0)

## 2010-11-25 LAB — BASIC METABOLIC PANEL
BUN: 9 mg/dL (ref 6–23)
CO2: 29 mEq/L (ref 19–32)
CO2: 31 mEq/L (ref 19–32)
CO2: 32 mEq/L (ref 19–32)
Calcium: 8.4 mg/dL (ref 8.4–10.5)
Chloride: 100 mEq/L (ref 96–112)
Chloride: 100 mEq/L (ref 96–112)
GFR calc Af Amer: 48 mL/min — ABNORMAL LOW (ref >60)
Glucose, Bld: 115 mg/dL — ABNORMAL HIGH (ref 70–99)
Glucose, Bld: 118 mg/dL — ABNORMAL HIGH (ref 70–99)
Glucose, Bld: 121 mg/dL — ABNORMAL HIGH (ref 70–99)
Potassium: 3.1 mEq/L — ABNORMAL LOW (ref 3.5–5.1)
Sodium: 138 mEq/L (ref 135–145)
Sodium: 139 mEq/L (ref 135–145)

## 2010-11-25 LAB — URINE CULTURE
Colony Count: NO GROWTH
Culture: NO GROWTH

## 2010-11-25 LAB — URINE MICROSCOPIC-ADD ON

## 2010-11-25 LAB — APTT: aPTT: 28 seconds (ref 24–37)

## 2010-11-25 LAB — PROTIME-INR: INR: 1.8 — ABNORMAL HIGH (ref 0.00–1.49)

## 2011-01-03 NOTE — Assessment & Plan Note (Signed)
[facility]   NAME:  Mia, Contreras NO.:  [id number]   MEDICAL RECORD NO.:  [id number]      DATE OF BIRTH:  28-Apr-1929   PHYSICIAN:  Orlando Penner. Sevier, M.D.  VISIT DATE:  11/06/2007                                   OFFICE VISIT   HISTORY:  This pleasant 77 year old female has been followed for a  traumatic wound on the right pretibial area occurring in the area where  she has significant chronic venous insufficiency.  This has been treated  most recently with an Unna wrap and has shown progressive improvement.  She returns today with no particular problems and no symptoms to suggest  worsening of the situation in any way.   PHYSICAL EXAMINATION:  VITALS:  Blood pressure 169/77, pulse 75,  respirations 18, temperature 97.9. The wound on the anterior pretibial  area about mid tibia right lower extremity now measures 0.3 x 0.3 x 0.1  cm and has a good healthy base.   IMPRESSION:  Improved traumatic stasis ulceration right lower extremity  near healing.   DISPOSITION:  The wound requires no debridement today.   Extremities placed in an Unna wrap.   The patient will be seen in follow-up for 1 week with anticipation of  complete healing at that time. She is accordingly advised to bring with  her her compression stocking at that time and in response to her  question I have suggested that she probably will need to wear it for a  minimum of 1 month after this wound is healed.           ______________________________  Orlando Penner [place] Pepper, M.D.     RES/MEDQ  D:  11/06/2007  T:  11/07/2007  Job:  [id number]

## 2011-01-03 NOTE — Assessment & Plan Note (Signed)
[facility] OFFICE NOTE   NAME:Mia Contreras                           MRN:          [id number]  DATE:01/24/2007                            DOB:          11-30-1928    Mia Contreras is a 77 year old white female referred for possible iron  deficiency anemia.   HISTORY OF PRESENT ILLNESS:  Mia Contreras is completely asymptomatic in  terms of any GI complaints.  She has had previous breast carcinoma with  mastectomy, and is followed by Dr. [name].  She has had some fluctuations  in her hemoglobin recently, and has been on and off iron therapy, but I  could not see where she really had iron deficiency, and stool Hemoccult  cards have been negative.  She denies any GI complaints whatsoever,  although reviewing her chart, she in the past was on Prevacid, which she  is not taking at this time.  Because of her anemia, she did undergo CT  scan and MRI of the abdomen in April, which showed some small  nonspecific lesions in her liver, probably representing liver cysts.  There was also a hiatal hernia noted, and possible gallstones, although  she did not have an abdominal ultrasound exam, and denies any  hepatobiliary complaints.  She specifically denies dyspepsia, reflux  symptoms, dysphagia, change in bowel habits, melena, or hematochezia.  I  did screening colonoscopy on this patient in April 2005, and this was  entirely normal except for diverticulosis.   PAST MEDICAL HISTORY:  Remarkable for:  1. Mild essential hypertension, well controlled on medication.  2. She also has chronic thyroid dysfunction.  3. Degenerative arthritis.  4. Chronic anxiety syndrome.  5. Has had a previous lumpectomy in 2000.  6. Hysterectomy in 1972.  7. She also has scoliosis of her back.  8. Has had carpal tunnel syndrome in her right hand.   MEDICATIONS:  Include:  1. Lotrel 5/20 mg a day.  2. Aspirin 81 mg a day.  3. Tenoretic 100/25  mg a day.  4. Calcium with vitamin D daily.  5. B-12 daily.   SHE, IN THE PAST, HAS HAD REACTIONS TO:  1. DIAZIDE.  2. ULTRAM.  3. CELEBREX.   FAMILY HISTORY:  Remarkable for diabetes and atherosclerotic  cardiovascular disease.  Her brother had prostate cancer.  There are no  known gastrointestinal problems.   SOCIAL HISTORY:  She is single, lives by herself.  She is semi-retired,  and works at [employer]  She does not smoke or use ethanol.   REVIEW OF SYSTEMS:  Otherwise noncontributory, except for chronic back  pain and multiple orthopedic pains in her shoulders and elbows.  She  denies any current cardiovascular, pulmonary, genitourinary, neurologic,  or endocrine problems.   LABORATORY DATA:  Recent lab data showed a hemoglobin of 12.8 with 21%  iron saturation.  She had needle aspiration of her thyroid per Dr.  [name] in January 2008, which showed some hyperplastic thyroid tissue.   EXAMINATION:  She is a healthy-appearing white female, in no acute  distress, appearing younger than  her stated age.  She is 5 feet 3 inches tall, and weighs 148 pounds.  Blood pressure  136/64.  Pulse was 68 and regular.  I could not appreciate stigmata of chronic liver disease.  CHEST:  Was entirely clear.  There were no murmurs, gallops, or rubs noted.  ABDOMEN:  Showed no hepatosplenomegaly, masses, or tenderness.  Bowel  sounds were normal.  EXTREMITIES:  Were unremarkable.  Mental status was clear.  RECTAL:  Exam was deferred.   I do not think that Mia Contreras needs followup colonoscopy at this  point.  Her iron deficiency is poorly documented.  She certainly has no  GI complaints or documented guaiac positive stools, and had a  colonoscopy that was negative within the last 3 years.   RECOMMENDATIONS:  1. Check celiac panel to be sure she is not having iron malabsorption.  2. See no need for further gallbladder workup since she has no      symptoms whatsoever.  3. Patient  denies any reflux symptoms, so will not treat her with PPI      despite her hiatal hernia seen on radiographic studies.  4. Whether or not further MRI followup is needed, defer her CT scan to      Dr. [name] and Dr. Alain Marion, who know this patient extremely well.     Mia Contreras. Mia Iles, MD, [facility] Ore, [place]  Electronically Signed    DRP/MedQ  DD: 01/24/2007  DT: 01/24/2007  Job #: [id number]   cc:   Mia Lacks. Plotnikov, MD  Mia Contreras, M.D.

## 2011-01-03 NOTE — Assessment & Plan Note (Signed)
[facility]   NAME:  Mia Contreras, Mia Contreras NO.:  [id number]   MEDICAL RECORD NO.:  [id number]      DATE OF BIRTH:  Jan 08, 1929   PHYSICIAN:  Epifania Gore. Nils Pyle, M.D. VISIT DATE:  10/31/2007                                   OFFICE VISIT   SUBJECTIVE:  [name] is a 77 year old lady whom we are treating for  a post-traumatic stasis ulcer involving the lateral right lower  extremity.  In the interim she has been treated with a promagran  dressing, hydrogel and a Profore Lite.  She continues to be ambulatory.  There has been no excessive drainage, malodor, pain, or fever.   OBJECTIVE:  Blood pressure is 145/73, respirations 16, pulse rate 82,  temperature 97.7.  Inspection of the right lower extremity shows that there has been  progress in the healing of the wound.  The wound is clean.  The  extremity is warm with 3+ pulse.  There is advancing epithelium and near  complete coverage of the previous injury with epithelium.  There is no  evidence of ascending infection.  There is no evidence of ischemia.   ASSESSMENT:  Clinical improvement with adequate compression.   PLAN:  We are placing the patient in an Unna boot and we will reevaluate  her in 1 week p.r.n.      Harold A. Nils Pyle, M.D.  Electronically Signed     HAN/MEDQ  D:  10/31/2007  T:  11/01/2007  Job:  [id number]

## 2011-01-03 NOTE — Assessment & Plan Note (Signed)
[facility]   NAME:  Mia Contreras, Mia Contreras NO.:  [id number]   MEDICAL RECORD NO.:  [id number]      DATE OF BIRTH:  [birth year]   PHYSICIAN:  Ricard Dillon, M.D.      VISIT DATE:                                   OFFICE VISIT   Mia Contreras tells me that she traumatized her leg while getting of the  car at a gas station.  She was seen by her primary physician, prescribed  antibiotics and later in the month was again prescribed another round of  antibiotics.  She had been applying topical antibiotic cream and Band-  Aids to the area.  Sometime later she was seeing her dermatologist for  another reason, who recommended Vaseline to the wound on her leg.  This  did not help.  Ultimately she has been referred here for a refractory  area secondary to trauma on the right anterior lower leg.   PAST MEDICAL HISTORY:  1. Wound to leg, nonhealing, status post remote injury at a gas      station.  2. History of venous insufficiency.  The patient has her own      compression stockings.  3. Hypertension.  4. History of breast cancer.  5. History of osteoarthritis.   SOCIAL HISTORY:  The patient lives in her own home.  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   PHYSICAL EXAMINATION:  GENERAL:  A pleasant woman in no distress who  appears well nourished.  RESPIRATORY:  Clear entry bilaterally.  CARDIAC:  Heart sounds are normal.  There are no murmurs.  EXTREMITIES:  Her posterior tibial and dorsalis pedis pulses are robust.  There is certainly no evidence of arterial insufficiency.  She has  changes of venous stasis, thin skin on her legs.   WOUND EXAM:  The wound is on the lateral aspect of the right leg just  lateral to the tibial margin.  This was covered with a thick eschar.  There is some erythema around this but really no obvious clinical  evidence of infection.  The wound underwent a excisional debridement to  remove surrounding skin and  eschar and to get into a clean granulating  base.  We used LET  for anesthesia, a #15 blade.  Hemostasis was with  direct pressure.   IMPRESSION:  Traumatic wound in the setting of mild venous stasis.  The  wound was debrided down to a granulating base as described above.  We  applied Silverlon hydrogel and put her in a Profore light wrap.  I did  not feel she needed systemic antibiotics.  The antimicrobial effect of  the silver should be enough for topical therapy.  We will see her a  again in a week.           ______________________________  Ricard Dillon, M.D.    MGR/MEDQ  D:  10/18/2007  T:  10/19/2007  Job:  [id number]   cc:   Alec Dr. Denna Haggard

## 2011-01-03 NOTE — Assessment & Plan Note (Signed)
[facility]   NAME:  YEILI, OLDHAM NO.:  [id number]   MEDICAL RECORD NO.:  [id number]      DATE OF BIRTH:  26-May-1929   PHYSICIAN:  Ricard Dillon, M.D. VISIT DATE:  10/25/2007                                   OFFICE VISIT   Mrs. Banbury was seen last week.  She had a traumatic wound in the  setting of venous stasis.  The wound was debrided down to a granulating  base.  We applied Silverlon hydrogel and put her in a Profore Lite wrap.  The original wound was traumatic but she has some degree of venous  stasis here.   EXAMINATION:  Temperature is 97.8, pulse 61, respirations 18, blood  pressure is 136/77.  The wound itself looks clean but dry.  There is no  evidence of surrounding infection.   IMPRESSION:  Traumatic wound in the setting of mild venous stasis.  The  wound did not need to be debrided today.  We applied a collagen based  dressing and hydrogel and rewrapped this.  We will see it again in a  week.  Overall, the dimensions were not that much changed today.           ______________________________  Ricard Dillon, M.D.     MGR/MEDQ  D:  10/25/2007  T:  10/27/2007  Job:  [id number]

## 2011-01-03 NOTE — Assessment & Plan Note (Signed)
[facility]   NAME:  QUINA, DALTON NO.:  [id number]   MEDICAL RECORD NO.:  [id number]      DATE OF BIRTH:  24-Sep-1928   PHYSICIAN:  Orlando Penner. Sevier, M.D.  VISIT DATE:  11/13/2007                                   OFFICE VISIT   HISTORY:  This 77 year old white female has been followed for a  traumatic wound in the right mid pretibial area.  This has shown  progressive improvement and she had been treated most recently with an  Unna wrap.   Since her last visit here a week ago, she reports no local or systemic  symptoms.   PHYSICAL EXAMINATION:  VITAL SIGNS:  Blood pressure 133/65, pulse 69  regular, respirations 16, temperature 97.8.  EXTREMITIES:  The pretibial area of the right lower extremity is now  completely healed, completely epithelialized, but looks a bit vulnerable  still.   IMPRESSION:  Healing traumatic wound right lower extremity.   DISPOSITION:  The area will be covered with a self adherent Allevyn pad  simply for mechanical protection, and I have advised that the patient  leave this in place for an additional 5 days.  During that period of  time, she will use Saran Wrap or other appropriate methods to keep the  area dry while showering.  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   I have suggested to her the use of the compression stocking on the right  lower extremity in particular as a good idea for at least 2-3 months.  We have also discussed the availability of commercial brand support  stockings (sup hose and the like) and she will investigate getting some  of those in colors that are more her liking.   She will be discharged from the clinic today and may return here on a  p.r.n. basis.           ______________________________  Orlando Penner  London Pepper, M.D.     RES/MEDQ  D:  11/13/2007  T:  11/13/2007  Job:  [id number]

## 2011-01-06 NOTE — Assessment & Plan Note (Signed)
[facility] OFFICE NOTE   NAME:Mia Contreras, Mia Contreras                           MRN:          [id number]  DATE:09/19/2006                            DOB:          [birth year]    PROCEDURE:  Trigger point injection.   INDICATIONS:  A very painful spot on the right deltoid muscle.   Risks, including unsuccessful procedure, skin edges bleeding, infection,  and others were explained to the patient in detail and she agreed to  proceed.   The painful area was identified and treated with Betadine and alcohol,  injected in fan-like fashion with 2% lidocaine 1 cc and 20 mg of Depo-  Medrol.  Tolerated it well.  Complications none.     [name], MD  Electronically Signed    AVP/MedQ  DD: 09/19/2006  DT: 09/19/2006  Job #: [id number]

## 2011-01-06 NOTE — Op Note (Signed)
   NAME:  Mia Contreras, Mia Contreras                         ACCOUNT NO.:  [id number]   MEDICAL RECORD NO.:  [id number]                   PATIENT TYPE:  AMB   LOCATION:  [place]                                  FACILITY:  [facility]   PHYSICIAN:  Daryll Brod, M.D.                    DATE OF BIRTH:  [birth year]   DATE OF PROCEDURE:  06/03/2003  DATE OF DISCHARGE:                                 OPERATIVE REPORT   PREOPERATIVE DIAGNOSIS:  Carpal tunnel syndrome right hand.   POSTOPERATIVE DIAGNOSIS:  Carpal tunnel syndrome right hand.   OPERATION:  Decompression right median nerve.   SURGEON:  Daryll Brod, M.D.   ASSISTANTDimas Millin.   ANESTHESIA:  Forearm based IV regional.   INDICATIONS FOR PROCEDURE:  The patient is a 77 year old female with a  history of carpal tunnel syndrome.  EMG nerve conduction positive.  She has  significant swelling of the ulnar bursa.   DESCRIPTION OF PROCEDURE:  The patient was brought to the operating room  where a forearm based IV regional anesthetized was carried out without  difficulty.  She was prepped using the DuraPrep in supine position with  right arm free.  A longitudinal incision was made in the palm and carried  down through subcutaneous tissue.  Bleeders were electrocauterized.  Palmar  fascia was split.  Superficial palmar arch was identified.  Flexor tendon of  the ring and little finger identified.  A significant amount of synovial  fluid was immediately encountered.  The retractors were placed.  The carpal  retinaculum was incised to the ulnar side of the median nerve with sharp  dissection.  A right angle and Sewell retractor placed between skin and  forearm fascia.  The fascia was released for approximately 3 cm just  proximal to the wrist crease under direct vision.  Canal was explored.  No  further lesions were identified.  Wound was irrigated.  Nerve was noted to  be hyperemic in the area of compression.  Tenosynovial tissue was not  markedly thickened  and as such, it was not excised.  The wound was copiously  irrigated with saline.  Skin was closed with interrupted 5-0 nylon suture.  Sterile compressive dressing and splint was applied.  The patient tolerated  the procedure well and was taken to the recovery room for observation in  satisfactory condition.  The patient  is discharged home to return to the  [facility] in one week on Vicodin and Keflex.                                               Daryll Brod, M.D.    GK/MEDQ  D:  06/03/2003  T:  06/03/2003  Job:  [id number]

## 2011-01-26 ENCOUNTER — Encounter (INDEPENDENT_AMBULATORY_CARE_PROVIDER_SITE_OTHER): Payer: Self-pay | Admitting: Surgery

## 2011-02-27 ENCOUNTER — Ambulatory Visit (INDEPENDENT_AMBULATORY_CARE_PROVIDER_SITE_OTHER): Payer: Medicare Other | Admitting: Internal Medicine

## 2011-02-27 VITALS — BP 138/74 | HR 74 | Temp 97.1°F | Wt 143.0 lb

## 2011-02-27 DIAGNOSIS — S81812A Laceration without foreign body, left lower leg, initial encounter: Secondary | ICD-10-CM

## 2011-02-27 DIAGNOSIS — S81809A Unspecified open wound, unspecified lower leg, initial encounter: Secondary | ICD-10-CM

## 2011-02-27 DIAGNOSIS — Z23 Encounter for immunization: Secondary | ICD-10-CM

## 2011-02-27 MED ORDER — TETANUS-DIPHTH-ACELL PERTUSSIS 5-2.5-18.5 LF-MCG/0.5 IM SUSP: 0.5000 mL | Freq: Once | INTRAMUSCULAR | Status: DC

## 2011-02-28 NOTE — Progress Notes (Signed)
  Subjective:    Patient ID: Mia Contreras, female    DOB: 12-07-1928, 77 y.o.   MRN: [id number]  HPI While pruning the plants on her patio Mrs. Oien backed into a sharp branch sustaining a laceration to the left calve. She has been cleaning the wound with H202 and applying bandaide. She has had no fever, chills and there has been no purulent drainage from the wound.  PMH, FamHx and SocHx reviewed for any changes and relevance.    Review of Systems  Skin: Positive for wound.  All other systems reviewed and are negative.       Objective:   Physical Exam Vitals reviewed - normal Gen'l - WNWD whie woman who is in no distress HEENT - [place]/AT, C&S clear Pulmonary - normal respirations Cor - RRR Derm 3 cm vertical gash left calve that is into the dermis. There is rolled back, non-viable epidermis over 2/3 wound. Good granulation base, no eschar, no purulent drainage and no surrounding erythema.       Assessment & Plan:  1. Laceration left leg - the wound looks clean with no signs of infection.  Plan - wash wound twice a day with soap and water, moving in a direction to not dislodge epidermis fold.           After rinse and dry apply scant amount of mupirocin           Cover with bandaide          Return for failure to heal

## 2011-03-15 ENCOUNTER — Encounter: Payer: Self-pay | Admitting: *Deleted

## 2011-03-16 ENCOUNTER — Other Ambulatory Visit: Payer: Medicare Other

## 2011-03-16 ENCOUNTER — Other Ambulatory Visit: Payer: Self-pay | Admitting: Internal Medicine

## 2011-03-16 DIAGNOSIS — E7800 Pure hypercholesterolemia, unspecified: Secondary | ICD-10-CM

## 2011-03-16 DIAGNOSIS — I10 Essential (primary) hypertension: Secondary | ICD-10-CM

## 2011-03-22 ENCOUNTER — Ambulatory Visit: Payer: Medicare Other | Admitting: Internal Medicine

## 2011-03-23 ENCOUNTER — Encounter: Payer: Self-pay | Admitting: Endocrinology

## 2011-03-23 ENCOUNTER — Other Ambulatory Visit (INDEPENDENT_AMBULATORY_CARE_PROVIDER_SITE_OTHER): Payer: Medicare Other

## 2011-03-23 ENCOUNTER — Other Ambulatory Visit: Payer: Self-pay | Admitting: Internal Medicine

## 2011-03-23 ENCOUNTER — Ambulatory Visit (INDEPENDENT_AMBULATORY_CARE_PROVIDER_SITE_OTHER): Payer: Medicare Other | Admitting: Endocrinology

## 2011-03-23 VITALS — BP 102/60 | HR 85 | Temp 97.8°F | Ht 64.0 in | Wt 140.8 lb

## 2011-03-23 DIAGNOSIS — I10 Essential (primary) hypertension: Secondary | ICD-10-CM

## 2011-03-23 DIAGNOSIS — E0590 Thyrotoxicosis, unspecified without thyrotoxic crisis or storm: Secondary | ICD-10-CM

## 2011-03-23 LAB — MICROALBUMIN / CREATININE URINE RATIO
Creatinine,U: 37.2 mg/dL
Microalb Creat Ratio: 1.6 mg/g (ref 0.0–30.0)
Microalb, Ur: 0.6 mg/dL (ref 0.0–1.9)

## 2011-03-23 LAB — LIPID PANEL
Cholesterol: 172 mg/dL (ref 0–200)
LDL Cholesterol: 93 mg/dL (ref 0–99)
Total CHOL/HDL Ratio: 3

## 2011-03-23 LAB — BASIC METABOLIC PANEL
BUN: 17 mg/dL (ref 6–23)
CO2: 26 mEq/L (ref 19–32)
Chloride: 104 mEq/L (ref 96–112)
Creatinine, Ser: 1 mg/dL (ref 0.4–1.2)

## 2011-03-23 NOTE — Progress Notes (Signed)
Subjective:    Patient ID: Mia Contreras, female    DOB: November 20, 1928, 77 y.o.   MRN: [id number]  HPI Pt is 10 mos s/p i-131 rx for hyperthyroidism, due to a multinodular goiter.   She says the thyroid is smaller than last year, but still big.  Past Medical History  Diagnosis Date  . Cancer   . GOITER, MULTINODULAR 05/06/2010    Dr [name]  . HYPERTHYROIDISM 05/23/2010  . HYPERLIPIDEMIA 08/20/2007  . ANEMIA-NOS 08/20/2007  . ANXIETY 03/16/2007  . CARPAL TUNNEL SYNDROME, RIGHT 01/11/2008  . HYPERTENSION 03/16/2007  . VENOUS INSUFFICIENCY 09/05/2007  . DIVERTICULOSIS, COLON 08/20/2007  . CELLULITIS, LEG, RIGHT 08/20/2007  . OSTEOARTHRITIS 03/16/2007  . OSTEOPOROSIS 08/20/2007  . SCOLIOSIS 01/11/2008  . BREAST CANCER, HX OF 03/16/2007  . SKIN CANCER, HX OF 11/12/2008     R cheek 2010, L Cheek 2011 Dr. Tonia Brooms  . PREMATURE ATRIAL CONTRACTIONS 09/21/2010  . Thyroid nodule   . Cataract     floaters    Past Surgical History  Procedure Date  . Total knee arthroplasty   . Breast lumpectomy 2001  . Abdominal hysterectomy   . Appendectomy 1972  . Mastectomy, partial     s/p left  . Carpal tunnel release   . Total knee arthroplasty 2010    Right-Dr Para March  . Cataract extraction   . Skin cancer excision     face-Dr. Bing Plume  . Xrt     chemo, surgery    History   Social History  . Marital Status: Single    Spouse Name: N/A    Number of Children: N/A  . Years of Education: N/A   Occupational History  . Not on file.   Social History Main Topics  . Smoking status: Former Smoker    Quit date: 08/21/1980  . Smokeless tobacco: Not on file  . Alcohol Use: Yes     OCCASIONAL  . Drug Use: Not on file  . Sexually Active: Not on file   Other Topics Concern  . Not on file   Social History Narrative  . No narrative on file    Current Outpatient Prescriptions on File Prior to Visit  Medication Sig Dispense Refill  . acetaminophen (TYLENOL) 500 MG tablet Take 500 mg by mouth as  directed.        Marland Kitchen ALPRAZolam (XANAX) 0.25 MG tablet Take 1-2 tablets two times a day as needed       . amLODipine (NORVASC) 5 MG tablet Take 5 mg by mouth daily.        Marland Kitchen aspirin 81 MG tablet Take 81 mg by mouth daily.        . benazepril (LOTENSIN) 20 MG tablet Take 20 mg by mouth daily.        . Calcium Carbonate (CALCIUM 600 PO) Take by mouth.        . cholecalciferol (VITAMIN D) 1000 UNITS tablet Take 1,000 Units by mouth daily.         Current Facility-Administered Medications on File Prior to Visit  Medication Dose Route Frequency Provider Last Rate Last Dose  . TDaP (BOOSTRIX) injection 0.5 mL  0.5 mL Intramuscular Once Rosezetta Schlatter, MD        Allergies  Allergen Reactions  . Celecoxib     REACTION: nausea  . Tramadol Hcl     REACTION: nausea    Family History  Problem Relation Age of Onset  . Dementia Mother   . Cancer Brother  Prostate Cancer  . Diabetes Other     BP 102/60  Pulse 85  Temp(Src) 97.8 F (36.6 C) (Oral)  Ht 5\' 4"  (1.626 m)  Wt 140 lb 12.8 oz (63.866 kg)  BMI 24.17 kg/m2  SpO2 97%  Review of Systems Denies weight change.     Objective:   Physical Exam GENERAL: no distress Neck: the left lobe of the thyroid is 10x normal, and the right is only slightly enlarged.  i can't tell if there is a mass, or if the entire lobe is that large.    Assessment & Plan:  Multinodular goiter, unchanged Hyperthyroidism.  As of last check, it was resolved with i-131 rx

## 2011-03-23 NOTE — Patient Instructions (Signed)
blood tests are being ordered for you today.  please call [phone] to hear your test results.  You will be prompted to enter the 9-digit "MRN" number that appears at the top left of this page, followed by #.  Then you will hear the message. Please make a follow-up appointment in 6 months.

## 2011-03-29 ENCOUNTER — Encounter (INDEPENDENT_AMBULATORY_CARE_PROVIDER_SITE_OTHER): Payer: Self-pay | Admitting: Surgery

## 2011-03-29 ENCOUNTER — Encounter: Payer: Self-pay | Admitting: Internal Medicine

## 2011-03-29 ENCOUNTER — Ambulatory Visit (INDEPENDENT_AMBULATORY_CARE_PROVIDER_SITE_OTHER): Payer: Medicare Other | Admitting: Internal Medicine

## 2011-03-29 DIAGNOSIS — S81009A Unspecified open wound, unspecified knee, initial encounter: Secondary | ICD-10-CM

## 2011-03-29 DIAGNOSIS — E0590 Thyrotoxicosis, unspecified without thyrotoxic crisis or storm: Secondary | ICD-10-CM

## 2011-03-29 DIAGNOSIS — E042 Nontoxic multinodular goiter: Secondary | ICD-10-CM

## 2011-03-29 DIAGNOSIS — S91009A Unspecified open wound, unspecified ankle, initial encounter: Secondary | ICD-10-CM

## 2011-03-29 DIAGNOSIS — I499 Cardiac arrhythmia, unspecified: Secondary | ICD-10-CM

## 2011-03-29 DIAGNOSIS — I10 Essential (primary) hypertension: Secondary | ICD-10-CM

## 2011-03-29 DIAGNOSIS — S81809A Unspecified open wound, unspecified lower leg, initial encounter: Secondary | ICD-10-CM

## 2011-03-29 MED ORDER — ALPRAZOLAM 0.25 MG PO TABS: 0.2500 mg | ORAL_TABLET | Freq: Every evening | ORAL | Status: DC | PRN

## 2011-03-29 MED ORDER — BENAZEPRIL HCL 20 MG PO TABS: 20.0000 mg | ORAL_TABLET | Freq: Every day | ORAL | Status: DC

## 2011-03-29 MED ORDER — ASPIRIN 81 MG PO TABS: 324.0000 mg | ORAL_TABLET | Freq: Every day | ORAL | Status: DC

## 2011-03-29 MED ORDER — AMLODIPINE BESYLATE 5 MG PO TABS: 5.0000 mg | ORAL_TABLET | Freq: Every day | ORAL | Status: DC

## 2011-03-29 NOTE — Assessment & Plan Note (Signed)
On RX 

## 2011-03-29 NOTE — Assessment & Plan Note (Signed)
On Rx 

## 2011-03-29 NOTE — Assessment & Plan Note (Signed)
Card consult ASA 325 mg qd pc

## 2011-03-29 NOTE — Progress Notes (Signed)
  Subjective:    Patient ID: Mia Contreras, female    DOB: [birth year], 77 y.o.   MRN: [id number]  HPI  The patient presents for a follow-up of  chronic hypertension, chronic dyslipidemia, skin wound on LLE C/o L knee pain   Review of Systems  Constitutional: Negative for chills, activity change, appetite change, fatigue and unexpected weight change.  HENT: Negative for congestion, mouth sores and sinus pressure.   Eyes: Negative for visual disturbance.  Respiratory: Negative for cough and chest tightness.   Gastrointestinal: Negative for nausea and abdominal pain.  Genitourinary: Negative for frequency, difficulty urinating and vaginal pain.  Musculoskeletal: Positive for joint swelling (L knee). Negative for back pain and gait problem.  Skin: Negative for pallor and rash.  Neurological: Negative for dizziness, tremors, weakness, numbness and headaches.  Psychiatric/Behavioral: Negative for confusion and sleep disturbance.       Objective:   Physical Exam  Constitutional: She appears well-developed and well-nourished. No distress.  HENT:  Head: Normocephalic.  Right Ear: External ear normal.  Left Ear: External ear normal.  Nose: Nose normal.  Mouth/Throat: Oropharynx is clear and moist.  Eyes: Conjunctivae are normal. Pupils are equal, round, and reactive to light. Right eye exhibits no discharge. Left eye exhibits no discharge.  Neck: Normal range of motion. Neck supple. No JVD present. No tracheal deviation present. No thyromegaly present.  Cardiovascular: Normal rate and normal heart sounds.        irreg irreg  Pulmonary/Chest: No stridor. No respiratory distress. She has no wheezes.  Abdominal: Soft. Bowel sounds are normal. She exhibits no distension and no mass. There is no tenderness. There is no rebound and no guarding.  Musculoskeletal: She exhibits no edema and no tenderness.  Lymphadenopathy:    She has no cervical adenopathy.  Neurological: She displays normal  reflexes. No cranial nerve deficit. She exhibits normal muscle tone. Coordination normal.  Skin: No rash noted. No erythema.       L lower leg scab - see below  Psychiatric: She has a normal mood and affect. Her behavior is normal. Judgment and thought content normal.      LLE scab - black    Assessment & Plan:

## 2011-03-29 NOTE — Assessment & Plan Note (Signed)
Procedure:    Wound debriding/cutting  Indication:    foot scab, painful  Consent: verbal  Risks and benefits were explained to the patient. Skin was cleaned with alcohol. I removed a 1.5x0.6 cm scab carefully with a round blade. Skin remained intact. Ulcer 0.3x0.6 cm underneath was cleaned. Tolerated well. Complications: none. Bandaid applied

## 2011-04-19 ENCOUNTER — Encounter: Payer: Self-pay | Admitting: Cardiovascular Disease

## 2011-04-19 ENCOUNTER — Ambulatory Visit (INDEPENDENT_AMBULATORY_CARE_PROVIDER_SITE_OTHER): Payer: Medicare Other | Admitting: Cardiovascular Disease

## 2011-04-19 DIAGNOSIS — I499 Cardiac arrhythmia, unspecified: Secondary | ICD-10-CM

## 2011-04-19 DIAGNOSIS — R9431 Abnormal electrocardiogram [ECG] [EKG]: Secondary | ICD-10-CM

## 2011-04-19 DIAGNOSIS — I491 Atrial premature depolarization: Secondary | ICD-10-CM

## 2011-04-19 DIAGNOSIS — R0989 Other specified symptoms and signs involving the circulatory and respiratory systems: Secondary | ICD-10-CM

## 2011-04-19 DIAGNOSIS — E785 Hyperlipidemia, unspecified: Secondary | ICD-10-CM

## 2011-04-19 DIAGNOSIS — I10 Essential (primary) hypertension: Secondary | ICD-10-CM

## 2011-04-19 NOTE — Patient Instructions (Signed)
Your physician has requested that you have an echocardiogram. Echocardiography is a painless test that uses sound waves to create images of your heart. It provides your doctor with information about the size and shape of your heart and how well your heart's chambers and valves are working. This procedure takes approximately one hour. There are no restrictions for this procedure.   Your physician has requested that you have a carotid duplex. This test is an ultrasound of the carotid arteries in your neck. It looks at blood flow through these arteries that supply the brain with blood. Allow one hour for this exam. There are no restrictions or special instructions.    Your physician wants you to follow-up in: 6 months.   You will receive a reminder letter in the mail two months in advance. If you don't receive a letter, please call our office to schedule the follow-up appointment.

## 2011-04-19 NOTE — Assessment & Plan Note (Signed)
Asymptomatic and chronic.  In future can consider change to beta blocker but not indicated now

## 2011-04-19 NOTE — Assessment & Plan Note (Signed)
Well controlled.  Continue current medications and low sodium Dash type diet.    

## 2011-04-19 NOTE — Assessment & Plan Note (Signed)
Cholesterol is at goal.  Continue current dose of statin and diet Rx.  No myalgias or side effects.  F/U  LFT's in 6 months. Lab Results  Component Value Date   LDLCALC 93 03/23/2011

## 2011-04-19 NOTE — Assessment & Plan Note (Signed)
Not prevously described  F/U carotid duplex.  ASA

## 2011-04-19 NOTE — Progress Notes (Signed)
77 yo referred for irregular heart beat.  Chronic asymptomatic.  Noted as far back as 2010 before knee surgery.  Actually has PAC;s with multifocal origin.  No palpitations, dsypnea, SSCP or syncope.  No afib and no TIA.  Still working 3 days/week at [employer].  Reviewed dobutamine echo from 2010 and normal with multifocal PAC/MAT.  Has HTN Rx with calcium blocker.  Not on beta blocker.  Multinodular goiter with normal thyroid labs this year.  Denies SSCP, dyspnea, neuro symptoms,   ROS: Denies fever, malais, weight loss, blurry vision, decreased visual acuity, cough, sputum, SOB, hemoptysis, pleuritic pain, palpitaitons, heartburn, abdominal pain, melena, lower extremity edema, claudication, or rash.  All other systems reviewed and negative   General: Affect appropriate Healthy:  appears stated age 89: normal Neck supple with no adenopathy JVP normal no bruits no thyromegaly Lungs clear with no wheezing and good diaphragmatic motion Heart:  S1/S2 no murmur,rub, gallop or click PMI normal Abdomen: benighn, BS positve, no tenderness, no AAA no bruit.  No HSM or HJR Distal pulses intact with no bruits No edema Neuro non-focal Skin warm and dry No muscular weakness  Medications Current Outpatient Prescriptions  Medication Sig Dispense Refill  . acetaminophen (APAP ARTHRITIS) 650 MG CR tablet Take 650 mg by mouth 2 (two) times daily as needed.        . ALPRAZolam (XANAX) 0.25 MG tablet Take 1 tablet (0.25 mg total) by mouth at bedtime as needed for sleep. Take 1-2 tablets two times a day as needed  90 tablet  1  . amLODipine (NORVASC) 5 MG tablet Take 1 tablet (5 mg total) by mouth daily.  90 tablet  3  . aspirin 325 MG tablet Take 325 mg by mouth daily.        . benazepril (LOTENSIN) 20 MG tablet Take 1 tablet (20 mg total) by mouth daily.  90 tablet  3  . Calcium Carbonate (CALCIUM 600 PO) Take by mouth.        . cholecalciferol (VITAMIN D) 1000 UNITS tablet Take 1,000 Units by mouth  daily.         Current Facility-Administered Medications  Medication Dose Route Frequency Provider Last Rate Last Dose  . TDaP (BOOSTRIX) injection 0.5 mL  0.5 mL Intramuscular Once Rosezetta Schlatter, MD        Allergies Celecoxib; Codeine; and Tramadol hcl  Family History: Family History  Problem Relation Age of Onset  . Dementia Mother   . Heart disease Mother   . Mental retardation Mother   . Mental illness Mother     dementia  . Hypertension Mother   . Cancer Brother     Prostate Cancer  . Diabetes Other   . Heart attack Father   . Alcohol abuse Father   . Alzheimer's disease Mother     Social History: History   Social History  . Marital Status: Single    Spouse Name: N/A    Number of Children: N/A  . Years of Education: N/A   Occupational History  . Not on file.   Social History Main Topics  . Smoking status: Former Smoker    Quit date: 12/20/1980  . Smokeless tobacco: Not on file  . Alcohol Use: Yes     OCCASIONAL  . Drug Use: No  . Sexually Active: Not on file   Other Topics Concern  . Not on file   Social History Narrative  . No narrative on file    Electrocardiogram:  NSR 87 PAC LAD ? IMI and poor R wave progression  Compared to 05/11/09 less atrial ectopy  Assessment and Plan

## 2011-04-19 NOTE — Assessment & Plan Note (Signed)
No change from 2010 at which time dobutamine echo was normal.  Suspect ? MI's are axis shift  Check echo

## 2011-04-26 ENCOUNTER — Encounter (INDEPENDENT_AMBULATORY_CARE_PROVIDER_SITE_OTHER): Payer: Medicare Other | Admitting: Cardiology

## 2011-04-26 ENCOUNTER — Ambulatory Visit (HOSPITAL_COMMUNITY): Payer: Medicare Other | Attending: Cardiovascular Disease

## 2011-04-26 DIAGNOSIS — I10 Essential (primary) hypertension: Secondary | ICD-10-CM | POA: Insufficient documentation

## 2011-04-26 DIAGNOSIS — R222 Localized swelling, mass and lump, trunk: Secondary | ICD-10-CM | POA: Insufficient documentation

## 2011-04-26 DIAGNOSIS — R0989 Other specified symptoms and signs involving the circulatory and respiratory systems: Secondary | ICD-10-CM

## 2011-04-26 DIAGNOSIS — I499 Cardiac arrhythmia, unspecified: Secondary | ICD-10-CM

## 2011-04-26 DIAGNOSIS — I491 Atrial premature depolarization: Secondary | ICD-10-CM

## 2011-04-26 DIAGNOSIS — I059 Rheumatic mitral valve disease, unspecified: Secondary | ICD-10-CM | POA: Insufficient documentation

## 2011-04-26 DIAGNOSIS — I079 Rheumatic tricuspid valve disease, unspecified: Secondary | ICD-10-CM | POA: Insufficient documentation

## 2011-04-26 DIAGNOSIS — I6529 Occlusion and stenosis of unspecified carotid artery: Secondary | ICD-10-CM

## 2011-04-26 DIAGNOSIS — R9431 Abnormal electrocardiogram [ECG] [EKG]: Secondary | ICD-10-CM

## 2011-04-26 DIAGNOSIS — E785 Hyperlipidemia, unspecified: Secondary | ICD-10-CM | POA: Insufficient documentation

## 2011-05-04 ENCOUNTER — Ambulatory Visit (INDEPENDENT_AMBULATORY_CARE_PROVIDER_SITE_OTHER): Payer: Medicare Other | Admitting: Cardiovascular Disease

## 2011-05-04 ENCOUNTER — Encounter: Payer: Self-pay | Admitting: Cardiovascular Disease

## 2011-05-04 DIAGNOSIS — R0989 Other specified symptoms and signs involving the circulatory and respiratory systems: Secondary | ICD-10-CM

## 2011-05-04 DIAGNOSIS — E785 Hyperlipidemia, unspecified: Secondary | ICD-10-CM

## 2011-05-04 DIAGNOSIS — I509 Heart failure, unspecified: Secondary | ICD-10-CM

## 2011-05-04 DIAGNOSIS — I10 Essential (primary) hypertension: Secondary | ICD-10-CM

## 2011-05-04 DIAGNOSIS — I499 Cardiac arrhythmia, unspecified: Secondary | ICD-10-CM

## 2011-05-04 DIAGNOSIS — I428 Other cardiomyopathies: Secondary | ICD-10-CM

## 2011-05-04 MED ORDER — CARVEDILOL 6.25 MG PO TABS: 6.2500 mg | ORAL_TABLET | Freq: Two times a day (BID) | ORAL | Status: DC

## 2011-05-04 MED ORDER — BENAZEPRIL HCL 40 MG PO TABS: 40.0000 mg | ORAL_TABLET | Freq: Every day | ORAL | Status: DC

## 2011-05-04 NOTE — Assessment & Plan Note (Signed)
Since EF down will stop calcium blocker and start beta blocker

## 2011-05-04 NOTE — Assessment & Plan Note (Signed)
F/U duplex in 2 years no critical disease on [name]

## 2011-05-04 NOTE — Assessment & Plan Note (Signed)
Cholesterol is at goal.  Continue current dose of statin and diet Rx.  No myalgias or side effects.  F/U  LFT's in 6 months. Lab Results  Component Value Date   LDLCALC 93 03/23/2011

## 2011-05-04 NOTE — Assessment & Plan Note (Signed)
Asymptomatic decrease in LV function ? From previous chemo.  Increase ACE and add beta blocker.  F/U echo in 6 months  Myovue to R.O CAD

## 2011-05-04 NOTE — Assessment & Plan Note (Signed)
PAC/MAT may respond better to beta blocker in setting of low EF

## 2011-05-04 NOTE — Progress Notes (Signed)
77 yo referred for irregular heart beat. Chronic asymptomatic. Noted as far back as 2010 before knee surgery. Actually has PAC;s with multifocal origin. No palpitations, dsypnea, SSCP or syncope. No afib and no TIA. Still working 3 days/week at [employer]. Reviewed dobutamine echo from 2010 and normal with multifocal PAC/MAT. Has HTN Rx with calcium blocker. Not on beta blocker. Multinodular goiter with normal thyroid labs this year. Denies SSCP, dyspnea, neuro symptoms,   Had echo 9/5 EF 35% with diffuse hypokinesis  Carotid same day also reviewed  0-39% bilateral disease.    She is asymptomatic.  Had XRT and chemo a long time ago for her breast CA.  Will need myovue to R/O CAD.  Discussed diagnosis of DCM with her.  Will need to stop calcium blocker and adjust ACE.  With PAC/MAT add beta blocker.    She wants to fly to [facility] to see brother next week and this is ok.  Will need left knee TKR in near future with Noemi Chapel  Suspect she will do fine  ROS: Denies fever, malais, weight loss, blurry vision, decreased visual acuity, cough, sputum, SOB, hemoptysis, pleuritic pain, palpitaitons, heartburn, abdominal pain, melena, lower extremity edema, claudication, or rash.  All other systems reviewed and negative  General: Affect appropriate Healthy:  appears stated age 77: normal Neck supple with no adenopathy JVP normal no bruits no thyromegaly Lungs clear with no wheezing and good diaphragmatic motion Heart:  S1/S2 no murmur,rub, gallop or click PMI normal Abdomen: benighn, BS positve, no tenderness, no AAA no bruit.  No HSM or HJR Distal pulses intact with no bruits No edema Neuro non-focal Skin warm and dry No muscular weakness   Current Outpatient Prescriptions  Medication Sig Dispense Refill  . acetaminophen (APAP ARTHRITIS) 650 MG CR tablet Take 650 mg by mouth 2 (two) times daily as needed.        . ALPRAZolam (XANAX) 0.25 MG tablet Take 1 tablet (0.25 mg total) by mouth at bedtime  as needed for sleep. Take 1-2 tablets two times a day as needed  90 tablet  1  . aspirin 325 MG tablet Take 325 mg by mouth daily.        . benazepril (LOTENSIN) 20 MG tablet Take 1 tablet (20 mg total) by mouth daily.  90 tablet  3  . Calcium Carbonate (CALCIUM 600 PO) Take by mouth.        . cholecalciferol (VITAMIN D) 1000 UNITS tablet Take 1,000 Units by mouth daily.         Current Facility-Administered Medications  Medication Dose Route Frequency Provider Last Rate Last Dose  . TDaP (BOOSTRIX) injection 0.5 mL  0.5 mL Intramuscular Once Rosezetta Schlatter, MD        Allergies  Celecoxib; Codeine; and Tramadol hcl  Electrocardiogram:  Assessment and Plan

## 2011-05-04 NOTE — Patient Instructions (Signed)
Your physician recommends that you schedule a follow-up appointment in: 8-10 WEEKS  STOP AMLODIPINE  START CARVEDILOL 6.25 MG ONE TABLET TWICE DAILY  INCREASE BENAZEPRIL TO 40 MG ONCE DAILY  Your physician has requested that you have a lexiscan myoview. For further information please visit [email or web address]. Please follow instruction sheet, as given.

## 2011-05-18 ENCOUNTER — Ambulatory Visit (HOSPITAL_COMMUNITY): Payer: Medicare Other | Attending: Cardiovascular Disease | Admitting: Radiology

## 2011-05-18 DIAGNOSIS — I491 Atrial premature depolarization: Secondary | ICD-10-CM

## 2011-05-18 DIAGNOSIS — I428 Other cardiomyopathies: Secondary | ICD-10-CM | POA: Insufficient documentation

## 2011-05-18 DIAGNOSIS — R0609 Other forms of dyspnea: Secondary | ICD-10-CM

## 2011-05-18 DIAGNOSIS — I4949 Other premature depolarization: Secondary | ICD-10-CM

## 2011-05-18 MED ORDER — REGADENOSON 0.4 MG/5ML IV SOLN
0.4000 mg | Freq: Once | INTRAVENOUS | Status: AC
  Administered 2011-05-18: 0.4 mg via INTRAVENOUS

## 2011-05-18 MED ORDER — TECHNETIUM TC 99M TETROFOSMIN IV KIT
11.0000 | PACK | Freq: Once | INTRAVENOUS | Status: AC | PRN
  Administered 2011-05-18: 11 via INTRAVENOUS

## 2011-05-18 MED ORDER — TECHNETIUM TC 99M TETROFOSMIN IV KIT
33.0000 | PACK | Freq: Once | INTRAVENOUS | Status: AC | PRN
  Administered 2011-05-18: 33 via INTRAVENOUS

## 2011-05-18 NOTE — Progress Notes (Signed)
[place] [address] [phone]  Cardiology Nuclear Med Mia Contreras is a 77 y.o. female [id number] 03-Jun-1929   Nuclear Med Background Indication for Stress Test:  Evaluation for Ischemia, and recent Echo with EF=35-40% History:  No previous documented CAD, 9/10 Dobutamine Echo: NL,9/12 Echo:EF=35-40%, and History of Chemo, PVC's and CHF Cardiac Risk Factors: Carotid Disease, Family History - CAD, History of Smoking, Hypertension and Lipids  Symptoms:  DOE and Fatigue with Exertion   Nuclear Pre-Procedure Caffeine/Decaff Intake:  None NPO After: 7:00pm   Lungs:  Clear IV 0.9% NS with Angio Cath:  20g  IV Site: R Forearm  IV Started by:  Eliezer Lofts, EMT-P  Chest Size (in):  36 Cup Size: B  Height: 5\' 4"  (1.626 m)  Weight:  140 lb (63.504 kg)  BMI:  Body mass index is 24.03 kg/(m^2). Tech Comments:  NA    Nuclear Med Study 1 or 2 day study: 1 day  Stress Test Type:  Lexiscan  Reading MD: Darlin Coco, MD  Order Authorizing Provider:  Jenkins Rouge, MD  Resting Radionuclide: Technetium 49m Tetrofosmin  Resting Radionuclide Dose: 11.0 mCi   Stress Radionuclide:  Technetium 36m Tetrofosmin  Stress Radionuclide Dose: 33.0 mCi           Stress Protocol Rest HR: 68 Stress HR: 94  Rest BP: 165/78 Stress BP: 158/77  Exercise Time (min): n/a METS: n/a   Predicted Max HR: 141 bpm % Max HR: 66.67 bpm Rate Pressure Product: 14852   Dose of Adenosine (mg):  n/a Dose of Lexiscan: 0.4 mg  Dose of Atropine (mg): n/a Dose of Dobutamine: n/a mcg/kg/min (at max HR)  Stress Test Technologist: Irven Baltimore, RN  Nuclear Technologist:  Charlton Amor, CNMT     Rest Procedure:  Myocardial perfusion imaging was performed at rest 45 minutes following the intravenous administration of Technetium 65m Tetrofosmin. Rest ECG: SB and Sinus Arrhythmia, Poor R wave progression, PVC's, PJC  Stress Procedure:  The patient  received IV Lexiscan 0.4 mg over 15-seconds. Technetium 67m Tetrofosmin was injected at 30-seconds.  There were no significant changes with Lexiscan.There were frequent PVC's (asymptomatic), rare PJC, PAC.  Quantitative spect images were obtained after a 45-minute delay. Stress ECG: No significant change from baseline ECG  QPS Raw Data Images:  Normal; no motion artifact; normal heart/lung ratio. Stress Images:  Normal homogeneous uptake in all areas of the myocardium. Rest Images:  Normal homogeneous uptake in all areas of the myocardium. Subtraction (SDS):  No evidence of ischemia. Transient Ischemic Dilatation (Normal <1.22):  1.07 Lung/Heart Ratio (Normal <0.45):  0.29  Quantitative Gated Spect Images QGS EDV:  165 ml QGS ESV:  112 ml QGS cine images:  Global hypokinesis QGS EF: 32%  Impression Exercise Capacity:  Lexiscan with no exercise. BP Response:  Normal blood pressure response. Clinical Symptoms:  No chest pain. ECG Impression:  No significant ST segment change suggestive of ischemia. Comparison with Prior Nuclear Study: No previous nuclear study performed  Overall Impression:  Abnormal stress nuclear study.  Global LV systolic dysfunction.  No ischemia seen.    Darlin Coco

## 2011-06-29 ENCOUNTER — Ambulatory Visit (INDEPENDENT_AMBULATORY_CARE_PROVIDER_SITE_OTHER): Payer: Medicare Other | Admitting: Cardiovascular Disease

## 2011-06-29 ENCOUNTER — Ambulatory Visit: Payer: Medicare Other | Admitting: Cardiovascular Disease

## 2011-06-29 ENCOUNTER — Encounter: Payer: Self-pay | Admitting: Cardiovascular Disease

## 2011-06-29 DIAGNOSIS — I509 Heart failure, unspecified: Secondary | ICD-10-CM

## 2011-06-29 DIAGNOSIS — E785 Hyperlipidemia, unspecified: Secondary | ICD-10-CM

## 2011-06-29 DIAGNOSIS — I10 Essential (primary) hypertension: Secondary | ICD-10-CM

## 2011-06-29 DIAGNOSIS — M1990 Unspecified osteoarthritis, unspecified site: Secondary | ICD-10-CM

## 2011-06-29 NOTE — Assessment & Plan Note (Signed)
Cholesterol is at goal.  Continue current dose of statin and diet Rx.  No myalgias or side effects.  F/U  LFT's in 6 months. Lab Results  Component Value Date   LDLCALC 93 03/23/2011

## 2011-06-29 NOTE — Assessment & Plan Note (Signed)
F/U with Mia Contreras for left knee May need TLKR  Has had RTKR 2 years ago.  Clear to have this done

## 2011-06-29 NOTE — Patient Instructions (Signed)
Your physician recommends that you schedule a follow-up appointment in: 3 months.  

## 2011-06-29 NOTE — Progress Notes (Signed)
77 yo referred for irregular heart beat. Chronic asymptomatic. Noted as far back as 2010 before knee surgery. Actually has PAC;s with multifocal origin. No palpitations, dsypnea, SSCP or syncope. No afib and no TIA. Still working 3 days/week at [employer]. Reviewed dobutamine echo from 2010 and normal with multifocal PAC/MAT. Has HTN Rx with calcium blocker. Not on beta blocker. Multinodular goiter with normal thyroid labs this year. Denies SSCP, dyspnea, neuro symptoms,  Had echo 9/5 EF 35% with diffuse hypokinesis Carotid same day also reviewed 0-39% bilateral disease.  She is asymptomatic. Had XRT and chemo a long time ago for her breast CA. Will need myovue to R/O CAD. Discussed diagnosis of DCM with her. Will need to stop calcium blocker and adjust ACE. With PAC/MAT add beta blocker.  She wants to fly to [facility] to see brother next week and this is ok. Will need left knee TKR in near future with Noemi Chapel Suspect she will do fine  Reviewed myovue from 05/18/11  EF 32% no ischemia  Discussed the diagnosis of DCM with her.  Currently no symptoms  ROS: Denies fever, malais, weight loss, blurry vision, decreased visual acuity, cough, sputum, SOB, hemoptysis, pleuritic pain, palpitaitons, heartburn, abdominal pain, melena, lower extremity edema, claudication, or rash.  All other systems reviewed and negative  General: Affect appropriate Healthy:  appears stated age 54: normal Neck supple with no adenopathy JVP normal no bruits no thyromegaly Lungs clear with no wheezing and good diaphragmatic motion Heart:  S1/S2 no murmur,rub, gallop or click PMI normal Abdomen: benighn, BS positve, no tenderness, no AAA no bruit.  No HSM or HJR Distal pulses intact with no bruits No edema Neuro non-focal Skin warm and dry No muscular weakness   Current Outpatient Prescriptions  Medication Sig Dispense Refill  . acetaminophen (APAP ARTHRITIS) 650 MG CR tablet Take 650 mg by mouth 2 (two) times daily as  needed.        . ALPRAZolam (XANAX) 0.25 MG tablet Take 1 tablet (0.25 mg total) by mouth at bedtime as needed for sleep. Take 1-2 tablets two times a day as needed  90 tablet  1  . aspirin 325 MG tablet Take 325 mg by mouth daily.        . benazepril (LOTENSIN) 40 MG tablet Take 1 tablet (40 mg total) by mouth daily.  30 tablet  12  . Calcium Carbonate (CALCIUM 600 PO) Take by mouth.        . carvedilol (COREG) 6.25 MG tablet Take 1 tablet (6.25 mg total) by mouth 2 (two) times daily.  60 tablet  11  . cholecalciferol (VITAMIN D) 1000 UNITS tablet Take 1,000 Units by mouth daily.         Current Facility-Administered Medications  Medication Dose Route Frequency Provider Last Rate Last Dose  . TDaP (BOOSTRIX) injection 0.5 mL  0.5 mL Intramuscular Once Rosezetta Schlatter, MD        Allergies  Celecoxib; Codeine; and Tramadol hcl  Electrocardiogram:  Assessment and Plan

## 2011-06-29 NOTE — Assessment & Plan Note (Signed)
Nonischemic myovue EF 35% or so Likely nonischemic DCM ? From previous chemo.

## 2011-06-29 NOTE — Assessment & Plan Note (Signed)
Labile.  Tolerating higher ACE dose and beta blocker  Consider adding diuretic next visit

## 2011-07-22 DIAGNOSIS — J189 Pneumonia, unspecified organism: Secondary | ICD-10-CM

## 2011-07-22 HISTORY — DX: Pneumonia, unspecified organism: J18.9

## 2011-08-02 ENCOUNTER — Encounter: Payer: Self-pay | Admitting: Internal Medicine

## 2011-08-02 ENCOUNTER — Other Ambulatory Visit: Payer: Self-pay | Admitting: Internal Medicine

## 2011-08-02 ENCOUNTER — Other Ambulatory Visit (INDEPENDENT_AMBULATORY_CARE_PROVIDER_SITE_OTHER): Payer: Medicare Other

## 2011-08-02 ENCOUNTER — Ambulatory Visit (INDEPENDENT_AMBULATORY_CARE_PROVIDER_SITE_OTHER): Payer: Medicare Other | Admitting: Internal Medicine

## 2011-08-02 VITALS — BP 160/88 | HR 80 | Temp 97.9°F | Resp 16 | Wt 140.0 lb

## 2011-08-02 DIAGNOSIS — Z01818 Encounter for other preprocedural examination: Secondary | ICD-10-CM

## 2011-08-02 DIAGNOSIS — J069 Acute upper respiratory infection, unspecified: Secondary | ICD-10-CM

## 2011-08-02 DIAGNOSIS — I10 Essential (primary) hypertension: Secondary | ICD-10-CM

## 2011-08-02 DIAGNOSIS — H6120 Impacted cerumen, unspecified ear: Secondary | ICD-10-CM | POA: Insufficient documentation

## 2011-08-02 LAB — HEPATIC FUNCTION PANEL
Bilirubin, Direct: 0 mg/dL (ref 0.0–0.3)
Total Protein: 6.8 g/dL (ref 6.0–8.3)

## 2011-08-02 LAB — URINALYSIS, ROUTINE W REFLEX MICROSCOPIC
Nitrite: NEGATIVE
Specific Gravity, Urine: 1.015 (ref 1.000–1.030)
Urine Glucose: NEGATIVE
Urobilinogen, UA: 0.2 (ref 0.0–1.0)

## 2011-08-02 LAB — CBC WITH DIFFERENTIAL/PLATELET
Basophils Absolute: 0 10*3/uL (ref 0.0–0.1)
Eosinophils Absolute: 0.3 10*3/uL (ref 0.0–0.7)
HCT: 36.8 % (ref 36.0–46.0)
Lymphs Abs: 1.8 10*3/uL (ref 0.7–4.0)
MCHC: 33.5 g/dL (ref 30.0–36.0)
Monocytes Relative: 7.1 % (ref 3.0–12.0)
Neutro Abs: 5.8 10*3/uL (ref 1.4–7.7)
Platelets: 263 10*3/uL (ref 150.0–400.0)
RDW: 15.7 % — ABNORMAL HIGH (ref 11.5–14.6)

## 2011-08-02 LAB — BASIC METABOLIC PANEL
CO2: 28 mEq/L (ref 19–32)
Calcium: 9.2 mg/dL (ref 8.4–10.5)
Creatinine, Ser: 0.9 mg/dL (ref 0.4–1.2)

## 2011-08-02 LAB — TSH: TSH: 1.54 u[IU]/mL (ref 0.35–5.50)

## 2011-08-02 MED ORDER — ALPRAZOLAM 0.25 MG PO TABS: 0.2500 mg | ORAL_TABLET | Freq: Every evening | ORAL | Status: DC | PRN

## 2011-08-02 MED ORDER — FLUTICASONE PROPIONATE 50 MCG/ACT NA SUSP: 2.0000 | Freq: Every day | NASAL | Status: DC

## 2011-08-02 MED ORDER — CARVEDILOL 12.5 MG PO TABS: 12.5000 mg | ORAL_TABLET | Freq: Two times a day (BID) | ORAL | Status: DC

## 2011-08-02 MED ORDER — AZITHROMYCIN 250 MG PO TABS: ORAL_TABLET | ORAL | Status: AC

## 2011-08-02 NOTE — Progress Notes (Signed)
Subjective:    Patient ID: Mia Contreras, female    DOB: 14-Oct-1928, 77 y.o.   MRN: [id number]  HPI  IM consult  Req by Dr Noemi Chapel  Reason: pre-op clearance - L TKR in 3/13  Hx: The patient presents for a follow-up of  chronic hypertension, chronic dyslipidemia, knee OA - not controlled with medicines. TKR L is planned for 10/30/11 Dr Noemi Chapel C/o sinus congestion - clear d/c  Past Medical History  Diagnosis Date  . Cancer   . GOITER, MULTINODULAR 05/06/2010    Dr [name]  . HYPERTHYROIDISM 05/23/2010  . HYPERLIPIDEMIA 08/20/2007  . ANEMIA-NOS 08/20/2007  . ANXIETY 03/16/2007  . CARPAL TUNNEL SYNDROME, RIGHT 01/11/2008  . HYPERTENSION 03/16/2007  . VENOUS INSUFFICIENCY 09/05/2007  . DIVERTICULOSIS, COLON 08/20/2007  . CELLULITIS, LEG, RIGHT 08/20/2007  . OSTEOARTHRITIS 03/16/2007  . OSTEOPOROSIS 08/20/2007  . SCOLIOSIS 01/11/2008  . BREAST CANCER, HX OF 03/16/2007  . SKIN CANCER, HX OF 11/12/2008     R cheek 2010, L Cheek 2011 Dr. Tonia Brooms  . PREMATURE ATRIAL CONTRACTIONS 09/21/2010  . Thyroid nodule   . Cataract     floaters   Past Surgical History  Procedure Date  . Total knee arthroplasty   . Breast lumpectomy 2001  . Abdominal hysterectomy   . Appendectomy 1972  . Mastectomy, partial     s/p left  . Carpal tunnel release   . Total knee arthroplasty 2010    Right-Dr Para March  . Cataract extraction   . Skin cancer excision     face-Dr. Bing Plume  . Xrt     chemo, surgery    reports that she quit smoking about 30 years ago. She does not have any smokeless tobacco history on file. She reports that she drinks alcohol. She reports that she does not use illicit drugs. family history includes Alcohol abuse in her father; Alzheimer's disease in her mother; Cancer in her brother; Dementia in her mother; Diabetes in her other; Heart attack in her father; Heart disease in her mother; Hypertension in her mother; Mental illness in her mother; and Mental retardation in her mother. Allergies    Allergen Reactions  . Celecoxib     REACTION: nausea  . Codeine   . Tramadol Hcl     REACTION: nausea   Current Outpatient Prescriptions on File Prior to Visit  Medication Sig Dispense Refill  . acetaminophen (APAP ARTHRITIS) 650 MG CR tablet Take 650 mg by mouth 2 (two) times daily as needed.        . ALPRAZolam (XANAX) 0.25 MG tablet Take 1 tablet (0.25 mg total) by mouth at bedtime as needed for sleep. Take 1-2 tablets two times a day as needed  90 tablet  1  . aspirin 325 MG tablet Take 325 mg by mouth daily.        . benazepril (LOTENSIN) 40 MG tablet Take 1 tablet (40 mg total) by mouth daily.  30 tablet  12  . Calcium Carbonate (CALCIUM 600 PO) Take by mouth.        . cholecalciferol (VITAMIN D) 1000 UNITS tablet Take 1,000 Units by mouth daily.         Current Facility-Administered Medications on File Prior to Visit  Medication Dose Route Frequency Provider Last Rate Last Dose  . TDaP (BOOSTRIX) injection 0.5 mL  0.5 mL Intramuscular Once Charolett Bumpers Norins, MD       BP 160/88  Pulse 80  Temp(Src) 97.9 F (36.6 C) (Oral)  Resp 16  Wt 140 lb (63.504 kg)   Review of Systems  Constitutional: Negative for chills, activity change, appetite change, fatigue and unexpected weight change.  HENT: Positive for congestion. Negative for mouth sores and sinus pressure.   Eyes: Negative for visual disturbance.  Respiratory: Negative for cough and chest tightness.   Gastrointestinal: Negative for nausea and abdominal pain.  Genitourinary: Negative for frequency, difficulty urinating and vaginal pain.  Musculoskeletal: Positive for joint swelling (L knee hurts) and gait problem. Negative for back pain.  Skin: Negative for pallor and rash.  Neurological: Negative for dizziness, tremors, weakness, numbness and headaches.  Psychiatric/Behavioral: Negative for suicidal ideas, confusion and sleep disturbance. The patient is not nervous/anxious.        Objective:   Physical Exam   Constitutional: She appears well-developed and well-nourished. No distress.  HENT:  Head: Normocephalic.  Right Ear: External ear normal.  Left Ear: External ear normal.  Nose: Nose normal.       Nasal mucosa is swollen  Eyes: Conjunctivae are normal. Pupils are equal, round, and reactive to light. Right eye exhibits no discharge. Left eye exhibits no discharge.  Neck: Normal range of motion. Neck supple. No JVD present. No tracheal deviation present. No thyromegaly present.  Cardiovascular: Normal rate, regular rhythm and normal heart sounds.   Pulmonary/Chest: No stridor. No respiratory distress. She has no wheezes.  Abdominal: Soft. Bowel sounds are normal. She exhibits no distension and no mass. There is no tenderness. There is no rebound and no guarding.  Musculoskeletal: She exhibits tenderness (L knee). She exhibits no edema.  Lymphadenopathy:    She has no cervical adenopathy.  Neurological: She displays normal reflexes. No cranial nerve deficit. She exhibits normal muscle tone. Coordination normal.  Skin: No rash noted. No erythema.  Psychiatric: She has a normal mood and affect. Her behavior is normal. Judgment and thought content normal.  Wax in R ear   Procedure Note :     Procedure :  Ear irrigation   Indication:  Cerumen impaction R   Risks, including pain, dizziness, eardrum perforation, bleeding, infection and others as well as benefits were explained to the patient in detail. Verbal consent was obtained and the patient agreed to proceed.    We used "[employer]" field with lukewarm water for irrigation. A large amount wax was recovered. Procedure has also required manual wax removal with an ear loop.   Tolerated well. Complications: None.   Postprocedure instructions :  Call if problems.    Chart and heart tests results were reviewed     Assessment & Plan:

## 2011-08-02 NOTE — Assessment & Plan Note (Addendum)
Chronic and controlled BP at home has been WNL - I'll increase Coreg to 12.5 mg bid just in case  Continue with current prescription therapy as reflected on the Med list.

## 2011-08-02 NOTE — Assessment & Plan Note (Signed)
Will irrigate 

## 2011-08-02 NOTE — Assessment & Plan Note (Signed)
Zpac Flonase URI:12/12: x 4-6 wks

## 2011-08-03 ENCOUNTER — Telehealth: Payer: Self-pay | Admitting: Internal Medicine

## 2011-08-03 NOTE — Telephone Encounter (Signed)
Stacey, please, inform patient that all labs are ok Thx 

## 2011-08-06 ENCOUNTER — Encounter: Payer: Self-pay | Admitting: Internal Medicine

## 2011-08-06 NOTE — Assessment & Plan Note (Signed)
12/12 for L knee TKR on 3//11/13 She is medically clear for surgery BP is OK at home   Thank you!

## 2011-08-07 ENCOUNTER — Telehealth: Payer: Self-pay | Admitting: *Deleted

## 2011-08-07 MED ORDER — AMOXICILLIN 500 MG PO CAPS: 1000.0000 mg | ORAL_CAPSULE | Freq: Two times a day (BID) | ORAL | Status: DC

## 2011-08-07 NOTE — Telephone Encounter (Signed)
Pt informed

## 2011-08-07 NOTE — Telephone Encounter (Signed)
Ok Amoxicillin Thx

## 2011-08-07 NOTE — Telephone Encounter (Signed)
Pt called stating she has completed zpak and her symptoms have worsened. She is coughing up green-yellow phlegm. She is requesting something stronger to help resolve this. Please advise.

## 2011-08-08 ENCOUNTER — Ambulatory Visit (INDEPENDENT_AMBULATORY_CARE_PROVIDER_SITE_OTHER)
Admission: RE | Admit: 2011-08-08 | Discharge: 2011-08-08 | Disposition: A | Discharge status: Home / Self Care | Payer: Medicare Other | Source: Ambulatory Visit | Attending: Endocrinology | Admitting: Endocrinology

## 2011-08-08 ENCOUNTER — Encounter: Payer: Self-pay | Admitting: Endocrinology

## 2011-08-08 ENCOUNTER — Ambulatory Visit (INDEPENDENT_AMBULATORY_CARE_PROVIDER_SITE_OTHER): Payer: Medicare Other | Admitting: Endocrinology

## 2011-08-08 VITALS — BP 100/62 | HR 72 | Temp 99.4°F | Ht 62.0 in | Wt 138.1 lb

## 2011-08-08 DIAGNOSIS — R05 Cough: Secondary | ICD-10-CM

## 2011-08-08 NOTE — Telephone Encounter (Signed)
Pt informed

## 2011-08-08 NOTE — Patient Instructions (Addendum)
Loratadine-d (non-prescription) will help your congestion. Please finish the amoxicillin prescribed by dr Alain Marion.   Please return in 1 year. here is a sample of "advair-100."  take 1 puff 2x a day.  rinse mouth after using. A chest-x-ray is requested for you today.  please call [phone] to hear your test results.  You will be prompted to enter the 9-digit "MRN" number that appears at the top left of this page, followed by #.  Then you will hear the message.

## 2011-08-08 NOTE — Progress Notes (Signed)
Subjective:    Patient ID: Mia Contreras, female    DOB: 1928-10-18, 77 y.o.   MRN: [id number]  HPI Pt states 1 week of moderate congestion in the nose, and assoc low-grade temp.  She has wheezing.   She was rx'ed azithromycin last week--she feels no better.   Past Medical History  Diagnosis Date  . Cancer   . GOITER, MULTINODULAR 05/06/2010    Dr [name]  . HYPERTHYROIDISM 05/23/2010  . HYPERLIPIDEMIA 08/20/2007  . ANEMIA-NOS 08/20/2007  . ANXIETY 03/16/2007  . CARPAL TUNNEL SYNDROME, RIGHT 01/11/2008  . HYPERTENSION 03/16/2007  . VENOUS INSUFFICIENCY 09/05/2007  . DIVERTICULOSIS, COLON 08/20/2007  . CELLULITIS, LEG, RIGHT 08/20/2007  . OSTEOARTHRITIS 03/16/2007  . OSTEOPOROSIS 08/20/2007  . SCOLIOSIS 01/11/2008  . BREAST CANCER, HX OF 03/16/2007  . SKIN CANCER, HX OF 11/12/2008     R cheek 2010, L Cheek 2011 Dr. Tonia Brooms  . PREMATURE ATRIAL CONTRACTIONS 09/21/2010  . Thyroid nodule   . Cataract     floaters    Past Surgical History  Procedure Date  . Total knee arthroplasty   . Breast lumpectomy 2001  . Abdominal hysterectomy   . Appendectomy 1972  . Mastectomy, partial     s/p left  . Carpal tunnel release   . Total knee arthroplasty 2010    Right-Dr Para March  . Cataract extraction   . Skin cancer excision     face-Dr. Bing Plume  . Xrt     chemo, surgery    History   Social History  . Marital Status: Single    Spouse Name: N/A    Number of Children: N/A  . Years of Education: N/A   Occupational History  . Not on file.   Social History Main Topics  . Smoking status: Former Smoker    Quit date: 12/20/1980  . Smokeless tobacco: Not on file  . Alcohol Use: Yes     OCCASIONAL  . Drug Use: No  . Sexually Active: Not on file   Other Topics Concern  . Not on file   Social History Narrative  . No narrative on file    Current Outpatient Prescriptions on File Prior to Visit  Medication Sig Dispense Refill  . acetaminophen (APAP ARTHRITIS) 650 MG CR tablet Take 650 mg  by mouth 2 (two) times daily as needed.        . ALPRAZolam (XANAX) 0.25 MG tablet Take 1 tablet (0.25 mg total) by mouth at bedtime as needed for sleep. Take 1-2 tablets two times a day as needed  90 tablet  1  . amoxicillin (AMOXIL) 500 MG capsule Take 2 capsules (1,000 mg total) by mouth 2 (two) times daily.  40 capsule  0  . aspirin 325 MG tablet Take 325 mg by mouth daily.        . benazepril (LOTENSIN) 40 MG tablet Take 1 tablet (40 mg total) by mouth daily.  30 tablet  12  . Calcium Carbonate (CALCIUM 600 PO) Take by mouth.        . carvedilol (COREG) 12.5 MG tablet Take 1 tablet (12.5 mg total) by mouth 2 (two) times daily.  180 tablet  3  . cholecalciferol (VITAMIN D) 1000 UNITS tablet Take 1,000 Units by mouth daily.        . fluticasone (FLONASE) 50 MCG/ACT nasal spray Place 2 sprays into the nose daily.  16 g  11   Current Facility-Administered Medications on File Prior to Visit  Medication Dose Route Frequency  Provider Last Rate Last Dose  . TDaP (BOOSTRIX) injection 0.5 mL  0.5 mL Intramuscular Once Rosezetta Schlatter, MD        Allergies  Allergen Reactions  . Celecoxib     REACTION: nausea  . Codeine   . Tramadol Hcl     REACTION: nausea    Family History  Problem Relation Age of Onset  . Dementia Mother   . Heart disease Mother   . Mental retardation Mother   . Mental illness Mother     dementia  . Hypertension Mother   . Cancer Brother     Prostate Cancer  . Diabetes Other   . Heart attack Father   . Alcohol abuse Father   . Alzheimer's disease Mother     BP 100/62  Pulse 72  Temp(Src) 99.4 F (37.4 C) (Oral)  Ht 5\' 2"  (1.575 m)  Wt 138 lb 1.9 oz (62.651 kg)  BMI 25.26 kg/m2  SpO2 91%    Review of Systems She has a prod cough, and pain in the chest, with the cough.      Objective:   Physical Exam VS: see vs page GENERAL: no distress  head: no deformity eyes: no periorbital swelling, no proptosis external nose and ears are  normal mouth: no lesion seen Neck: the left lobe of the thyroid is 10x normal, and the right is only slightly enlarged. i can't tell if there is a mass, or if the entire lobe is that large.  Lab Results  Component Value Date   TSH 1.54 08/02/2011      Assessment & Plan:  Samuel [place], new Multinodular goiter, persistent

## 2011-08-16 ENCOUNTER — Ambulatory Visit (INDEPENDENT_AMBULATORY_CARE_PROVIDER_SITE_OTHER): Payer: Medicare Other | Admitting: Internal Medicine

## 2011-08-16 ENCOUNTER — Encounter: Payer: Self-pay | Admitting: Internal Medicine

## 2011-08-16 DIAGNOSIS — J449 Chronic obstructive pulmonary disease, unspecified: Secondary | ICD-10-CM

## 2011-08-16 DIAGNOSIS — J069 Acute upper respiratory infection, unspecified: Secondary | ICD-10-CM

## 2011-08-16 DIAGNOSIS — R05 Cough: Secondary | ICD-10-CM

## 2011-08-16 DIAGNOSIS — I10 Essential (primary) hypertension: Secondary | ICD-10-CM

## 2011-08-16 MED ORDER — HYDROCOD POLST-CHLORPHEN POLST 10-8 MG/5ML PO LQCR: 5.0000 mL | Freq: Two times a day (BID) | ORAL | Status: DC | PRN

## 2011-08-16 MED ORDER — LEVOFLOXACIN 500 MG PO TABS: 500.0000 mg | ORAL_TABLET | Freq: Every day | ORAL | Status: AC

## 2011-08-16 MED ORDER — LOSARTAN POTASSIUM 100 MG PO TABS: 100.0000 mg | ORAL_TABLET | Freq: Every day | ORAL | Status: DC

## 2011-08-16 MED ORDER — METHYLPREDNISOLONE ACETATE PF 80 MG/ML IJ SUSP
120.0000 mg | Freq: Once | INTRAMUSCULAR | Status: AC
  Administered 2011-08-16: 120 mg via INTRAMUSCULAR

## 2011-08-16 NOTE — Progress Notes (Signed)
  Subjective:    Patient ID: Mia Contreras, female    DOB: 01-Apr-1929, 77 y.o.   MRN: [id number]  HPI   HPI  C/o URI sx's x  14 days. C/o  cough, weakness. Not better with OTC medicines. Actually, the patient is getting worse. The patient did not sleep last night due to cough. She took a Zpac and a Amoxicillin.  Review of Systems  Constitutional: Positive for  fatigue.  HENT: Positive for congestion, rhinorrhea, sneezing and postnasal drip.   Eyes: Positive for photophobia and pain. Negative for discharge and visual disturbance.  Respiratory: Positive for cough and wheezing.   Positive for chest pain.  Gastrointestinal: Negative for vomiting, abdominal pain, diarrhea and abdominal distention.  Genitourinary: Negative for dysuria and difficulty urinating.  Skin: Negative for rash.  Neurological: Positive for dizziness, weakness and light-headedness.      Review of Systems     Objective:   Physical Exam  Constitutional: She appears well-developed. No distress.  HENT:  Head: Normocephalic.  Right Ear: External ear normal.  Left Ear: External ear normal.  Nose: Nose normal.  Mouth/Throat: Oropharynx is clear and moist.  Eyes: Conjunctivae are normal. Pupils are equal, round, and reactive to light. Right eye exhibits no discharge. Left eye exhibits no discharge.  Neck: Normal range of motion. Neck supple. No JVD present. No tracheal deviation present. No thyromegaly present.  Cardiovascular: Normal rate, regular rhythm and normal heart sounds.   Pulmonary/Chest: No stridor. No respiratory distress. She has no wheezes. She has rales (L>R).  Abdominal: Soft. Bowel sounds are normal. She exhibits no distension and no mass. There is no tenderness. There is no rebound and no guarding.  Musculoskeletal: She exhibits no edema and no tenderness.  Lymphadenopathy:    She has no cervical adenopathy.  Neurological: She displays normal reflexes. No cranial nerve deficit. She exhibits normal  muscle tone. Coordination normal.  Skin: No rash noted. No erythema.  Psychiatric: Her behavior is normal. Judgment and thought content normal.     CXR reviewed     Assessment & Plan:

## 2011-08-16 NOTE — Assessment & Plan Note (Signed)
CXR in 2 mo  Tussionex

## 2011-08-16 NOTE — Assessment & Plan Note (Signed)
D/c benazepril due to cough Start Cozaar

## 2011-08-16 NOTE — Assessment & Plan Note (Signed)
Cont Advair

## 2011-08-16 NOTE — Assessment & Plan Note (Signed)
Start Levaquin Tussionex Repeat CXR in 2 mo

## 2011-08-16 NOTE — Patient Instructions (Signed)
Continue Advair Stop amoxicillin, start Levaquin today Stop benazepril, start Cozaar

## 2011-08-31 DIAGNOSIS — M25569 Pain in unspecified knee: Secondary | ICD-10-CM | POA: Diagnosis not present

## 2011-08-31 DIAGNOSIS — M1710 Unilateral primary osteoarthritis, unspecified knee: Secondary | ICD-10-CM | POA: Diagnosis not present

## 2011-09-06 ENCOUNTER — Ambulatory Visit (INDEPENDENT_AMBULATORY_CARE_PROVIDER_SITE_OTHER): Payer: Medicare Other | Admitting: Internal Medicine

## 2011-09-06 ENCOUNTER — Encounter: Payer: Self-pay | Admitting: Internal Medicine

## 2011-09-06 DIAGNOSIS — R05 Cough: Secondary | ICD-10-CM

## 2011-09-06 DIAGNOSIS — J449 Chronic obstructive pulmonary disease, unspecified: Secondary | ICD-10-CM | POA: Diagnosis not present

## 2011-09-06 DIAGNOSIS — I10 Essential (primary) hypertension: Secondary | ICD-10-CM | POA: Diagnosis not present

## 2011-09-06 MED ORDER — HYDROCOD POLST-CHLORPHEN POLST 10-8 MG/5ML PO LQCR: 5.0000 mL | Freq: Two times a day (BID) | ORAL | Status: DC | PRN

## 2011-09-06 NOTE — Assessment & Plan Note (Signed)
Continue with current prescription therapy as reflected on the Med list.  

## 2011-09-06 NOTE — Assessment & Plan Note (Signed)
Better  

## 2011-09-06 NOTE — Assessment & Plan Note (Signed)
Improved

## 2011-09-06 NOTE — Progress Notes (Signed)
  Subjective:    Patient ID: Mia Contreras, female    DOB: 1928/12/24, 78 y.o.   MRN: [id number]  HPI F/u cough x 6 wks - better F/u COPD, HTN   Wt Readings from Last 3 Encounters:  09/06/11 134 lb (60.782 kg)  08/16/11 137 lb (62.143 kg)  08/08/11 138 lb 1.9 oz (62.651 kg)    Review of Systems  Constitutional: Negative for chills, activity change, appetite change, fatigue and unexpected weight change.  HENT: Negative for congestion, mouth sores and sinus pressure.   Eyes: Negative for visual disturbance.  Respiratory: Positive for cough. Negative for chest tightness.   Gastrointestinal: Negative for nausea and abdominal pain.  Genitourinary: Negative for frequency, difficulty urinating and vaginal pain.  Musculoskeletal: Negative for back pain and gait problem.  Skin: Negative for pallor and rash.  Neurological: Negative for dizziness, tremors, weakness, numbness and headaches.  Psychiatric/Behavioral: Negative for confusion and sleep disturbance.       Objective:   Physical Exam  Constitutional: She appears well-developed. No distress.  HENT:  Head: Normocephalic.  Right Ear: External ear normal.  Left Ear: External ear normal.  Nose: Nose normal.  Mouth/Throat: Oropharynx is clear and moist.  Eyes: Conjunctivae are normal. Pupils are equal, round, and reactive to light. Right eye exhibits no discharge. Left eye exhibits no discharge.  Neck: Normal range of motion. Neck supple. No JVD present. No tracheal deviation present. No thyromegaly present.  Cardiovascular: Normal rate, regular rhythm and normal heart sounds.   Pulmonary/Chest: No stridor. No respiratory distress. She has no wheezes.  Abdominal: Soft. Bowel sounds are normal. She exhibits no distension and no mass. There is no tenderness. There is no rebound and no guarding.  Musculoskeletal: She exhibits no edema and no tenderness.  Lymphadenopathy:    She has no cervical adenopathy.  Neurological: She displays  normal reflexes. No cranial nerve deficit. She exhibits normal muscle tone. Coordination normal.  Skin: No rash noted. No erythema.  Psychiatric: She has a normal mood and affect. Her behavior is normal. Judgment and thought content normal.          Assessment & Plan:

## 2011-09-06 NOTE — Patient Instructions (Signed)
Advair 1 inh daily x 1 month or longer

## 2011-09-20 ENCOUNTER — Telehealth: Payer: Self-pay | Admitting: *Deleted

## 2011-09-20 NOTE — Telephone Encounter (Signed)
No. 1 po q am prn anxiety and 1-2 po at hs prn sleep is correct. Sorry, Thx

## 2011-09-20 NOTE — Telephone Encounter (Signed)
Rf req for Alprazolam 0.25mg  1 po qhs prn sleep 1-2 po bid prn. Is this sig correct? Ok to fill?

## 2011-09-21 ENCOUNTER — Ambulatory Visit: Payer: Medicare Other | Admitting: Endocrinology

## 2011-09-21 NOTE — Telephone Encounter (Signed)
Rf phoned in with sig below.

## 2011-10-03 DIAGNOSIS — M1710 Unilateral primary osteoarthritis, unspecified knee: Secondary | ICD-10-CM | POA: Diagnosis not present

## 2011-10-03 DIAGNOSIS — M25569 Pain in unspecified knee: Secondary | ICD-10-CM | POA: Diagnosis not present

## 2011-10-04 ENCOUNTER — Telehealth: Payer: Self-pay | Admitting: *Deleted

## 2011-10-04 NOTE — Telephone Encounter (Signed)
She is clear for surgery. I do not have a form Thx

## 2011-10-04 NOTE — Telephone Encounter (Signed)
Pt left vm requesting that we send surgical clearance to Dr. Noemi Chapel. She states she gave you the form or slip for this back in December. Please advise.

## 2011-10-06 ENCOUNTER — Ambulatory Visit (INDEPENDENT_AMBULATORY_CARE_PROVIDER_SITE_OTHER): Payer: Medicare Other | Admitting: Endocrinology

## 2011-10-06 ENCOUNTER — Encounter: Payer: Self-pay | Admitting: Endocrinology

## 2011-10-06 DIAGNOSIS — E042 Nontoxic multinodular goiter: Secondary | ICD-10-CM | POA: Diagnosis not present

## 2011-10-06 MED ORDER — CEFUROXIME AXETIL 250 MG PO TABS: 250.0000 mg | ORAL_TABLET | Freq: Two times a day (BID) | ORAL | Status: DC

## 2011-10-06 NOTE — Progress Notes (Signed)
Subjective:    Patient ID: Mia Contreras, female    DOB: Jun 25, 1929, 78 y.o.   MRN: [id number]  HPI Pt is 16 mos s/p i-131 rx for hyperthyroidism, due to a multinodular goiter.  Pt states few days of slight prod quality cough in the chest, but no assoc fever.  Past Medical History  Diagnosis Date  . Cancer   . GOITER, MULTINODULAR 05/06/2010    Dr [name]  . HYPERTHYROIDISM 05/23/2010  . HYPERLIPIDEMIA 08/20/2007  . ANEMIA-NOS 08/20/2007  . ANXIETY 03/16/2007  . CARPAL TUNNEL SYNDROME, RIGHT 01/11/2008  . HYPERTENSION 03/16/2007  . VENOUS INSUFFICIENCY 09/05/2007  . DIVERTICULOSIS, COLON 08/20/2007  . CELLULITIS, LEG, RIGHT 08/20/2007  . OSTEOARTHRITIS 03/16/2007  . OSTEOPOROSIS 08/20/2007  . SCOLIOSIS 01/11/2008  . BREAST CANCER, HX OF 03/16/2007  . SKIN CANCER, HX OF 11/12/2008     R cheek 2010, L Cheek 2011 Dr. Tonia Brooms  . PREMATURE ATRIAL CONTRACTIONS 09/21/2010  . Thyroid nodule   . Cataract     floaters    Past Surgical History  Procedure Date  . Total knee arthroplasty   . Breast lumpectomy 2001  . Abdominal hysterectomy   . Appendectomy 1972  . Mastectomy, partial     s/p left  . Carpal tunnel release   . Total knee arthroplasty 2010    Right-Dr Para March  . Cataract extraction   . Skin cancer excision     face-Dr. Bing Plume  . Xrt     chemo, surgery    History   Social History  . Marital Status: Single    Spouse Name: N/A    Number of Children: N/A  . Years of Education: N/A   Occupational History  . Not on file.   Social History Main Topics  . Smoking status: Former Smoker    Quit date: 12/20/1980  . Smokeless tobacco: Not on file  . Alcohol Use: Yes     OCCASIONAL  . Drug Use: No  . Sexually Active: Not on file   Other Topics Concern  . Not on file   Social History Narrative  . No narrative on file    Current Outpatient Prescriptions on File Prior to Visit  Medication Sig Dispense Refill  . acetaminophen (APAP ARTHRITIS) 650 MG CR tablet Take 650 mg  by mouth 2 (two) times daily as needed.        . ALPRAZolam (XANAX) 0.25 MG tablet Take 1 tablet (0.25 mg total) by mouth at bedtime as needed for sleep. Take 1-2 tablets two times a day as needed  90 tablet  1  . aspirin 325 MG tablet Take 325 mg by mouth daily.        . Calcium Carbonate (CALCIUM 600 PO) Take by mouth.        . carvedilol (COREG) 12.5 MG tablet Take 1 tablet (12.5 mg total) by mouth 2 (two) times daily.  180 tablet  3  . cholecalciferol (VITAMIN D) 1000 UNITS tablet Take 1,000 Units by mouth daily.        . fluticasone (FLONASE) 50 MCG/ACT nasal spray Place 2 sprays into the nose daily.  16 g  11  . losartan (COZAAR) 100 MG tablet Take 1 tablet (100 mg total) by mouth daily.  30 tablet  11  . vitamin E 400 UNIT capsule Take 400 Units by mouth daily.       Current Facility-Administered Medications on File Prior to Visit  Medication Dose Route Frequency Provider Last Rate Last Dose  .  TDaP (BOOSTRIX) injection 0.5 mL  0.5 mL Intramuscular Once Rosezetta Schlatter, MD        Allergies  Allergen Reactions  . Celecoxib     REACTION: nausea  . Codeine   . Tramadol Hcl     REACTION: nausea    Family History  Problem Relation Age of Onset  . Dementia Mother   . Heart disease Mother   . Mental retardation Mother   . Mental illness Mother     dementia  . Hypertension Mother   . Cancer Brother     Prostate Cancer  . Diabetes Other   . Heart attack Father   . Alcohol abuse Father   . Alzheimer's disease Mother     BP 172/92  Pulse 75  Temp(Src) 97.7 F (36.5 C) (Oral)  Ht 5\' 4"  (1.626 m)  Wt 134 lb (60.782 kg)  BMI 23.00 kg/m2  SpO2 95%   Review of Systems Denies sob and earache.     Objective:   Physical Exam VITAL SIGNS:  See vs page GENERAL: no distress head: no deformity eyes: no periorbital swelling, no proptosis external nose and ears are normal mouth: no lesion seen Both eac's and tm's are normal Thyroid is 10x normal size, left > right.    LUNGS:  Clear to auscultation   Lab Results  Component Value Date   TSH 1.54 08/02/2011      Assessment & Plan:  Multinodular goiter, unchanged H/o hyperthyroidism, resolved wit i-131, but she is at risk for recurrence Acute bronchitis, new

## 2011-10-06 NOTE — Patient Instructions (Addendum)
i have sent a prescription to your pharmacy, for an antibiotic. I hope you feel better soon.  If you don't feel better by next week, please call your doctor. Please return in 1 year.

## 2011-10-10 NOTE — Telephone Encounter (Signed)
Pt informed. She states we need to fax over a letter and OV notes.

## 2011-10-11 ENCOUNTER — Ambulatory Visit: Payer: Medicare Other | Admitting: Cardiovascular Disease

## 2011-10-13 NOTE — Telephone Encounter (Signed)
Letter signed/faxed with last OV note.

## 2011-10-16 ENCOUNTER — Encounter (HOSPITAL_COMMUNITY): Payer: Self-pay | Admitting: Pharmacy Technician

## 2011-10-16 NOTE — Telephone Encounter (Signed)
Letter and last OV note faxed to Dr. Noemi Chapel

## 2011-10-18 ENCOUNTER — Encounter: Payer: Self-pay | Admitting: *Deleted

## 2011-10-18 ENCOUNTER — Encounter: Payer: Self-pay | Admitting: Cardiovascular Disease

## 2011-10-18 ENCOUNTER — Ambulatory Visit (INDEPENDENT_AMBULATORY_CARE_PROVIDER_SITE_OTHER): Payer: Medicare Other | Admitting: Cardiovascular Disease

## 2011-10-18 ENCOUNTER — Encounter: Payer: Self-pay | Admitting: Internal Medicine

## 2011-10-18 ENCOUNTER — Ambulatory Visit (INDEPENDENT_AMBULATORY_CARE_PROVIDER_SITE_OTHER): Payer: Medicare Other | Admitting: Internal Medicine

## 2011-10-18 VITALS — BP 136/72 | HR 81 | Temp 97.4°F | Resp 16 | Wt 135.0 lb

## 2011-10-18 DIAGNOSIS — I499 Cardiac arrhythmia, unspecified: Secondary | ICD-10-CM

## 2011-10-18 DIAGNOSIS — Z79899 Other long term (current) drug therapy: Secondary | ICD-10-CM

## 2011-10-18 DIAGNOSIS — J4489 Other specified chronic obstructive pulmonary disease: Secondary | ICD-10-CM

## 2011-10-18 DIAGNOSIS — J449 Chronic obstructive pulmonary disease, unspecified: Secondary | ICD-10-CM

## 2011-10-18 DIAGNOSIS — R0602 Shortness of breath: Secondary | ICD-10-CM

## 2011-10-18 DIAGNOSIS — E785 Hyperlipidemia, unspecified: Secondary | ICD-10-CM | POA: Diagnosis not present

## 2011-10-18 DIAGNOSIS — I10 Essential (primary) hypertension: Secondary | ICD-10-CM | POA: Diagnosis not present

## 2011-10-18 DIAGNOSIS — I509 Heart failure, unspecified: Secondary | ICD-10-CM

## 2011-10-18 LAB — BASIC METABOLIC PANEL
BUN: 17 mg/dL (ref 6–23)
Chloride: 105 mEq/L (ref 96–112)
Creatinine, Ser: 0.7 mg/dL (ref 0.4–1.2)
GFR: 82.77 mL/min (ref >60.00)
Glucose, Bld: 76 mg/dL (ref 70–99)
Potassium: 4.1 mEq/L (ref 3.5–5.1)

## 2011-10-18 MED ORDER — FLUTICASONE-SALMETEROL 250-50 MCG/DOSE IN AEPB: 1.0000 | INHALATION_SPRAY | Freq: Two times a day (BID) | RESPIRATORY_TRACT | Status: DC

## 2011-10-18 NOTE — Assessment & Plan Note (Signed)
Cholesterol is at goal.  Continue current dose of statin and diet Rx.  No myalgias or side effects.  F/U  LFT's in 6 months. Lab Results  Component Value Date   LDLCALC 93 03/23/2011

## 2011-10-18 NOTE — Patient Instructions (Signed)
Asthma, Adult Asthma is caused by narrowing of the air passages in the lungs. It may be triggered by pollen, dust, animal dander, molds, some foods, respiratory infections, exposure to smoke, exercise, emotional stress or other allergens (things that cause allergic reactions or allergies). Repeat attacks are common. HOME CARE INSTRUCTIONS   Use prescription medications as ordered by your caregiver.   Avoid pollen, dust, animal dander, molds, smoke and other things that cause attacks at home and at work.   You may have fewer attacks if you decrease dust in your home. Electrostatic air cleaners may help.   It may help to replace your pillows or mattress with materials less likely to cause allergies.   Talk to your caregiver about an action plan for managing asthma attacks at home, including, the use of a peak flow meter which measures the severity of your asthma attack. An action plan can help minimize or stop the attack without having to seek medical care.   If you are not on a fluid restriction, drink 8 to 10 glasses of water each day.   Always have a plan prepared for seeking medical attention, including, calling your physician, accessing local emergency care, and calling 911 (in the [place] for a severe attack.   Discuss possible exercise routines with your caregiver.   If animal dander is the cause of asthma, you may need to get rid of pets.  SEEK MEDICAL CARE IF:   You have wheezing and shortness of breath even if taking medicine to prevent attacks.   You have muscle aches, chest pain or thickening of sputum.   Your sputum changes from clear or white to yellow, green, gray, or bloody.   You have any problems that may be related to the medicine you are taking (such as a rash, itching, swelling or trouble breathing).  SEEK IMMEDIATE MEDICAL CARE IF:   Your usual medicines do not stop your wheezing or there is increased coughing and/or shortness of breath.   You have increased  difficulty breathing.   You have a fever.  MAKE SURE YOU:   Understand these instructions.   Will watch your condition.   Will get help right away if you are not doing well or get worse.  Document Released: 08/07/2005 Document Revised: 04/19/2011 Document Reviewed: 03/25/2008 [facility] Patient Information 2012 [employer].

## 2011-10-18 NOTE — Assessment & Plan Note (Signed)
Needs F/U for lung issues before knee surgery so it is not cancelled.  Dr Ronnald Ramp to see.

## 2011-10-18 NOTE — Assessment & Plan Note (Signed)
Stable no palpitations Continue beta blocker

## 2011-10-18 NOTE — Progress Notes (Signed)
Subjective:    Patient ID: Mia Contreras, female    DOB: 1928/12/12, 78 y.o.   MRN: [id number]  HPI She is new to me and was seen today on an urgent basis as she saw Dr. Johnsie Cancel this morning and he felt like she had some wheezing and wanted her to be seen by primary care. She has been ill for two months with a viral URI that was complicated by PNA, she has been feeling much better recently and had decided to stop using her advair diskus. She has some residual runny nose, nasal congestion and throat clearing but otherwise feels well. She is scheduled for TKR in 10 days.   Review of Systems  Constitutional: Negative for fever, chills, diaphoresis, activity change, appetite change, fatigue and unexpected weight change.  HENT: Positive for congestion, rhinorrhea and postnasal drip. Negative for hearing loss, ear pain, nosebleeds, sore throat, facial swelling, sneezing, trouble swallowing, neck pain, neck stiffness, voice change, sinus pressure, tinnitus and ear discharge.   Eyes: Negative.   Respiratory: Negative for cough, chest tightness, shortness of breath, wheezing and stridor.   Gastrointestinal: Negative for nausea, vomiting, abdominal pain, diarrhea and constipation.  Genitourinary: Negative.   Musculoskeletal: Positive for arthralgias (knees). Negative for myalgias, back pain, joint swelling and gait problem.  Skin: Negative for color change, pallor, rash and wound.  Neurological: Negative for dizziness, tremors, seizures, syncope, facial asymmetry, speech difficulty, weakness, light-headedness, numbness and headaches.  Hematological: Negative for adenopathy. Does not bruise/bleed easily.  Psychiatric/Behavioral: Negative.        Objective:   Physical Exam  Vitals reviewed. Constitutional: She is oriented to person, place, and time. She appears well-developed and well-nourished. No distress.  HENT:  Head: Normocephalic and atraumatic. No trismus in the jaw.  Right Ear: Hearing,  tympanic membrane, external ear and ear canal normal.  Left Ear: Hearing, tympanic membrane, external ear and ear canal normal.  Nose: Mucosal edema and rhinorrhea present. No nose lacerations, sinus tenderness, nasal deformity, septal deviation or nasal septal hematoma. No epistaxis.  No foreign bodies. Right sinus exhibits no maxillary sinus tenderness and no frontal sinus tenderness. Left sinus exhibits no maxillary sinus tenderness and no frontal sinus tenderness.  Mouth/Throat: Oropharynx is clear and moist and mucous membranes are normal. Mucous membranes are not pale, not dry and not cyanotic. No uvula swelling. No oropharyngeal exudate, posterior oropharyngeal edema, posterior oropharyngeal erythema or tonsillar abscesses.  Eyes: Conjunctivae are normal. Right eye exhibits no discharge. Left eye exhibits no discharge. No scleral icterus.  Neck: Normal range of motion. Neck supple. No JVD present. No tracheal deviation present. No thyromegaly present.  Cardiovascular: Normal rate, normal heart sounds and intact distal pulses.  An irregularly irregular rhythm present. Exam reveals no gallop and no friction rub.   No murmur heard. Pulmonary/Chest: Effort normal and breath sounds normal. No stridor. No respiratory distress. She has no wheezes. She has no rales. She exhibits no tenderness.  Abdominal: Soft. Bowel sounds are normal. She exhibits no distension and no mass. There is no tenderness. There is no rebound and no guarding.  Musculoskeletal: Normal range of motion. She exhibits no edema and no tenderness.  Lymphadenopathy:    She has no cervical adenopathy.  Neurological: She is oriented to person, place, and time.  Skin: Skin is warm and dry. No rash noted. She is not diaphoretic. No erythema. No pallor.  Psychiatric: She has a normal mood and affect. Her behavior is normal. Judgment and thought content normal.  Assessment & Plan:

## 2011-10-18 NOTE — Assessment & Plan Note (Signed)
I think she has some lingering inflammation from the recent infections and perhaps some underlying asthma but I do not think she has any ongoing infection and dose not need any antibiotics. I have asked her to restart the advair diskus and have given her an injection of depo-medrol IM today to help reduce the affects of any persistent inflammation, I think she is low risk for surgery and as long as Dr. Johnsie Cancel clears her for surgery then I think she is safe for TKR surgery

## 2011-10-18 NOTE — Assessment & Plan Note (Signed)
Euvolemic with no CHF.  Check BNP and BMET.    Clear to have knee surgery from cardiac perspective

## 2011-10-18 NOTE — Progress Notes (Signed)
78 yo referred for irregular heart beat. Chronic asymptomatic. Noted as far back as 2010 before knee surgery. Actually has PAC;s with multifocal origin. No palpitations, dsypnea, SSCP or syncope. No afib and no TIA. Still working 3 days/week at [employer]. Reviewed dobutamine echo from 2010 and normal with multifocal PAC/MAT. Has HTN Rx with calcium blocker. Not on beta blocker. Multinodular goiter with normal thyroid labs this year. Denies SSCP, dyspnea, neuro symptoms,  Had echo 9/5 EF 35% with diffuse hypokinesis Carotid same day also reviewed 0-39% bilateral disease.  She is asymptomatic. Had XRT and chemo a long time ago for her breast CA. Will need myovue to R/O CAD. Discussed diagnosis of DCM with her. Will need to stop calcium blocker and adjust ACE. With PAC/MAT add beta blocker.  Will have TKR with Noemi Chapel 3/18  Reviewed myovue from 05/18/11 EF 32% no ischemia   Been sick since December.  Has been on 4 different antibiotics for pneumonia.  Cefuroxime currently tearing up her stomach and she has stopped taking them Since knee surgery upcoming she needs to have F/U to make sure she is ok  Will check BMET and BNP but dont think  There is a component of CHF  Echo 04/26/11 Study Conclusions  - Left ventricle: The cavity size was normal. Wall thickness was normal. Systolic function was moderately reduced. The estimated ejection fraction was in the range of 35% to 40%. Diffuse hypokinesis. Doppler parameters are consistent with abnormal left ventricular relaxation (grade 1 diastolic dysfunction). - Aortic valve: There was no stenosis. - Mitral valve: Trivial regurgitation. - Left atrium: The atrium was moderately dilated. - Right ventricle: The cavity size was normal. Systolic function was normal. - Right atrium: The atrium was mildly dilated. - Tricuspid valve: Peak RV-RA gradient: 27mm Hg (S). - Pulmonary arteries: PA peak pressure: 67mm Hg (S). - Inferior vena cava: The vessel was normal  in size; the respirophasic diameter changes were in the normal range (= 50%); findings are consistent with normal central venous pressure. - Pericardium, extracardiac: There appears to be a 7 x 5 cm heterogeneous extracardiac mass posterior to the left atrium and the atrioventricular groove.  ROS: Denies fever, malais, weight loss, blurry vision, decreased visual acuity, cough, sputum, SOB, hemoptysis, pleuritic pain, palpitaitons, heartburn, abdominal pain, melena, lower extremity edema, claudication, or rash.  All other systems reviewed and negative  General: Affect appropriate Healthy:  appears stated age 40: normal Neck supple with no adenopathy JVP normal no bruits no thyromegaly Lungs mild RLL wheezing and good diaphragmatic motion Heart:  S1/S2 no murmur, no rub, gallop or click PMI normal Abdomen: benighn, BS positve, no tenderness, no AAA no bruit.  No HSM or HJR Distal pulses intact with no bruits No edema Neuro non-focal Skin warm and dry No muscular weakness   Current Outpatient Prescriptions  Medication Sig Dispense Refill  . acetaminophen (APAP ARTHRITIS) 650 MG CR tablet Take 1,300 mg by mouth 2 (two) times daily as needed. Arthritis pain      . ALPRAZolam (XANAX) 0.25 MG tablet Take 0.25-0.5 mg by mouth 3 (three) times daily as needed. For anxiety      . aspirin 325 MG tablet Take 325 mg by mouth daily.       . Calcium Carbonate-Vitamin D (CALCIUM 600+D) 600-400 MG-UNIT per tablet Take 1 tablet by mouth daily.      . carvedilol (COREG) 12.5 MG tablet Take 1 tablet (12.5 mg total) by mouth 2 (two) times daily.  180 tablet  3  . fluticasone (FLONASE) 50 MCG/ACT nasal spray Place 2 sprays into the nose daily.  16 g  11  . losartan (COZAAR) 100 MG tablet Take 1 tablet (100 mg total) by mouth daily.  30 tablet  11  . vitamin C (ASCORBIC ACID) 500 MG tablet Take 500 mg by mouth daily as needed. If getting a cold       . vitamin E 400 UNIT capsule Take 400 Units by  mouth daily.       Current Facility-Administered Medications  Medication Dose Route Frequency Provider Last Rate Last Dose  . TDaP (BOOSTRIX) injection 0.5 mL  0.5 mL Intramuscular Once Rosezetta Schlatter, MD        Allergies  Cefuroxime; Celecoxib; Codeine; and Tramadol hcl  Electrocardiogram:  Assessment and Plan

## 2011-10-18 NOTE — Patient Instructions (Addendum)
Your physician recommends that you schedule a follow-up appointment in: [place]    Your physician recommends that you continue on your current medications as directed. Please refer to the Current Medication list given to you today. Your physician recommends that you return for lab work in: TODAY BMET  BNP  DX V58.69  428.0 DR JONES TODAY  AT 3:30 PM TO FOLLOW UP  ON URI PER DR Johnsie Cancel

## 2011-10-18 NOTE — Assessment & Plan Note (Signed)
Well controlled.  Continue current medications and low sodium Dash type diet.    

## 2011-10-24 ENCOUNTER — Other Ambulatory Visit: Payer: Self-pay | Admitting: Physician Assistant

## 2011-10-24 DIAGNOSIS — M1710 Unilateral primary osteoarthritis, unspecified knee: Secondary | ICD-10-CM | POA: Diagnosis not present

## 2011-10-24 DIAGNOSIS — I10 Essential (primary) hypertension: Secondary | ICD-10-CM | POA: Diagnosis not present

## 2011-10-24 NOTE — H&P (Signed)
Mia Contreras is an 78 y.o. female.   Chief Complaint: left knee DJD HPI: Mia Contreras is an 78 year old seen for follow-up from her left knee degenerative joint disease that continues to bother her with recurrent effusion. She's 2 years status post right total knee replacement and this is doing well but her left knee is getting progressively worse. She has pain with weightbearing activity and increased swelling. We saw her 6 weeks ago and aspirated 60 cc. of fluid from her left knee and injected the knee with Depo-Medrol/Marcaine but she has recurrent swelling and increased pain. The excruciating pain is getting progressively worse, limiting activities of daily living, poses a significant fall risk, and has failed multiple conservative treatments including intraarticular cortisone injections, intraarticular Supartz, bracing, medication and home physical therapy.  Past Medical History  Diagnosis Date  . Cancer   . GOITER, MULTINODULAR 05/06/2010    Dr [name]  . HYPERTHYROIDISM 05/23/2010  . HYPERLIPIDEMIA 08/20/2007  . ANEMIA-NOS 08/20/2007  . ANXIETY 03/16/2007  . CARPAL TUNNEL SYNDROME, RIGHT 01/11/2008  . HYPERTENSION 03/16/2007  . VENOUS INSUFFICIENCY 09/05/2007  . DIVERTICULOSIS, COLON 08/20/2007  . CELLULITIS, LEG, RIGHT 08/20/2007  . OSTEOARTHRITIS 03/16/2007  . OSTEOPOROSIS 08/20/2007  . SCOLIOSIS 01/11/2008  . BREAST CANCER, HX OF 03/16/2007  . SKIN CANCER, HX OF 11/12/2008     R cheek 2010, L Cheek 2011 Dr. Tonia Brooms  . PREMATURE ATRIAL CONTRACTIONS 09/21/2010  . Thyroid nodule   . Cataract     floaters    Past Surgical History  Procedure Date  . Total knee arthroplasty   . Breast lumpectomy 2001  . Abdominal hysterectomy   . Appendectomy 1972  . Mastectomy, partial     s/p left  . Carpal tunnel release   . Total knee arthroplasty 2010    Right-Dr Para March  . Cataract extraction   . Skin cancer excision     face-Dr. Bing Plume  . Xrt     chemo, surgery    Family History    Problem Relation Age of Onset  . Dementia Mother   . Heart disease Mother   . Mental retardation Mother   . Mental illness Mother     dementia  . Hypertension Mother   . Cancer Brother     Prostate Cancer  . Diabetes Other   . Heart attack Father   . Alcohol abuse Father   . Alzheimer's disease Mother    Social History:  reports that she quit smoking about 30 years ago. She does not have any smokeless tobacco history on file. She reports that she does not drink alcohol or use illicit drugs.  Allergies:  Allergies  Allergen Reactions  . Cefuroxime Diarrhea  . Celecoxib Nausea Only  . Codeine Nausea Only  . Tramadol Hcl Nausea Only    Medications Prior to Admission  Medication Sig Dispense Refill  . acetaminophen (APAP ARTHRITIS) 650 MG CR tablet Take 1,300 mg by mouth 2 (two) times daily as needed. Arthritis pain      . ALPRAZolam (XANAX) 0.25 MG tablet Take 0.25-0.5 mg by mouth 3 (three) times daily as needed. For anxiety      . aspirin 325 MG tablet Take 325 mg by mouth daily.       . Calcium Carbonate-Vitamin D (CALCIUM 600+D) 600-400 MG-UNIT per tablet Take 1 tablet by mouth daily.      . carvedilol (COREG) 12.5 MG tablet Take 1 tablet (12.5 mg total) by mouth 2 (two) times daily.  180 tablet  3  . fluticasone (FLONASE) 50 MCG/ACT nasal spray Place 2 sprays into the nose daily.  16 g  11  . Fluticasone-Salmeterol (ADVAIR DISKUS) 250-50 MCG/DOSE AEPB Inhale 1 puff into the lungs 2 (two) times daily.  60 each  0  . losartan (COZAAR) 100 MG tablet Take 1 tablet (100 mg total) by mouth daily.  30 tablet  11  . vitamin C (ASCORBIC ACID) 500 MG tablet Take 500 mg by mouth daily as needed. If getting a cold       . vitamin E 400 UNIT capsule Take 400 Units by mouth daily.       Medications Prior to Admission  Medication Dose Route Frequency Provider Last Rate Last Dose  . TDaP (BOOSTRIX) injection 0.5 mL  0.5 mL Intramuscular Once Rosezetta Schlatter, MD        No results  found for this or any previous visit (from the past 48 hour(s)). No results found.  Review of Systems  Constitutional: Negative.   HENT: Negative.   Eyes: Negative.   Cardiovascular: Negative.   Gastrointestinal: Negative.   Genitourinary: Negative.   Musculoskeletal:       Left knee pain  Skin: Negative.   Neurological: Negative.   Endo/Heme/Allergies: Negative.   Psychiatric/Behavioral: Negative.     Blood pressure 180/82, pulse 78, temperature 97.9 F (36.6 C), temperature source Oral, height 5\' 1"  (1.549 m), weight 61.689 kg (136 lb), SpO2 96.00%. Physical Exam  Constitutional: She is oriented to person, place, and time. She appears well-developed and well-nourished.  HENT:  Head: Normocephalic and atraumatic.  Mouth/Throat: Oropharynx is clear and moist.  Eyes: Conjunctivae and EOM are normal. Pupils are equal, round, and reactive to light.  Neck: Normal range of motion. Neck supple.  Cardiovascular: Normal rate, regular rhythm and normal heart sounds.   Respiratory: Effort normal and breath sounds normal.  GI: Soft. Bowel sounds are normal.  Genitourinary:       Not pertinent to current symptomatology therefore not examined.  Musculoskeletal:       Examination of her left knee reveals 3+ effusion diffuse pain, range of motion from -5 to 110 degrees knee is stable with normal patella tracking. Exam of the right knee reveals well healed total knee incision without swelling or pain, range of motion 0-115 degrees knee is stable with normal patella tracking. Vascular exam: pulses 2+ and symmetric  Neurological: She is alert and oriented to person, place, and time. She has normal reflexes.  Skin: Skin is warm and dry.  Psychiatric: She has a normal mood and affect. Her behavior is normal. Judgment and thought content normal.    Xray: AP,LATERAL, FLEXION, and SUNRISE views show significant joint space narrowing, with periarticular osteophytes, and subchondral  sclerosis.  Assessment Patient Active Problem List  Diagnoses  . GOITER, MULTINODULAR  . HYPERTHYROIDISM  . HYPERLIPIDEMIA  . ANEMIA-NOS  . ANXIETY  . CARPAL TUNNEL SYNDROME, RIGHT  . HYPERTENSION  . VENOUS INSUFFICIENCY  . UPPER RESPIRATORY INFECTION, ACUTE  . DIVERTICULOSIS, COLON  . CELLULITIS, LEG, RIGHT  . OSTEOARTHRITIS  . OSTEOPOROSIS  . SCOLIOSIS  . EDEMA  . ELECTROCARDIOGRAM, ABNORMAL  . WOUND, LEG  . BREAST CANCER, HX OF  . SKIN CANCER, HX OF  . TOBACCO USE, QUIT  . NEOPLASM OF UNCERTAIN BEHAVIOR OF SKIN  . PREMATURE ATRIAL CONTRACTIONS  . CYSTITIS  . Arrhythmia  . Bruit  . CHF (congestive heart failure)  . Preop exam for internal medicine  .  Cerumen impaction  . Cough  . COPD (chronic obstructive pulmonary disease)     Plan  I feel she is a candidate for left total knee replacement due to failed conservative care. Discussed risks benefits and possible complications of the surgery in detail and she understands this completely. Cleared by Dr Johnsie Cancel and Dr Alain Marion.    Shelsy Seng J 10/24/2011, 5:35 PM

## 2011-10-25 ENCOUNTER — Encounter (HOSPITAL_COMMUNITY)
Admission: RE | Admit: 2011-10-25 | Discharge: 2011-10-25 | Disposition: A | Discharge status: Home / Self Care | Payer: Medicare Other | Source: Ambulatory Visit | Attending: Orthopedic Surgery | Admitting: Orthopedic Surgery

## 2011-10-25 ENCOUNTER — Encounter (HOSPITAL_COMMUNITY): Payer: Self-pay

## 2011-10-25 ENCOUNTER — Encounter (HOSPITAL_COMMUNITY)
Admission: RE | Admit: 2011-10-25 | Discharge: 2011-10-25 | Disposition: A | Discharge status: Home / Self Care | Payer: Medicare Other | Source: Ambulatory Visit | Attending: Anesthesiology | Admitting: Anesthesiology

## 2011-10-25 DIAGNOSIS — K449 Diaphragmatic hernia without obstruction or gangrene: Secondary | ICD-10-CM | POA: Diagnosis not present

## 2011-10-25 DIAGNOSIS — I517 Cardiomegaly: Secondary | ICD-10-CM | POA: Diagnosis not present

## 2011-10-25 DIAGNOSIS — Z01811 Encounter for preprocedural respiratory examination: Secondary | ICD-10-CM | POA: Diagnosis not present

## 2011-10-25 HISTORY — DX: Pneumonia, unspecified organism: J18.9

## 2011-10-25 LAB — COMPREHENSIVE METABOLIC PANEL
ALT: 14 U/L (ref 0–35)
AST: 22 U/L (ref 0–37)
CO2: 28 mEq/L (ref 19–32)
Chloride: 105 mEq/L (ref 96–112)
GFR calc non Af Amer: 77 mL/min — ABNORMAL LOW (ref >90)
Sodium: 143 mEq/L (ref 135–145)
Total Bilirubin: 0.5 mg/dL (ref 0.3–1.2)

## 2011-10-25 LAB — SURGICAL PCR SCREEN
MRSA, PCR: NEGATIVE
Staphylococcus aureus: NEGATIVE

## 2011-10-25 LAB — URINALYSIS, ROUTINE W REFLEX MICROSCOPIC
Glucose, UA: NEGATIVE mg/dL
Hgb urine dipstick: NEGATIVE
pH: 6 (ref 5.0–8.0)

## 2011-10-25 LAB — CBC
MCV: 90.1 fL (ref 78.0–100.0)
Platelets: 294 10*3/uL (ref 150–400)
RBC: 4.23 MIL/uL (ref 3.87–5.11)
WBC: 9 10*3/uL (ref 4.0–10.5)

## 2011-10-25 LAB — URINE MICROSCOPIC-ADD ON

## 2011-10-25 LAB — DIFFERENTIAL
Basophils Absolute: 0 10*3/uL (ref 0.0–0.1)
Basophils Relative: 0 % (ref 0–1)
Eosinophils Absolute: 0.2 10*3/uL (ref 0.0–0.7)
Monocytes Absolute: 0.7 10*3/uL (ref 0.1–1.0)
Monocytes Relative: 8 % (ref 3–12)
Neutrophils Relative %: 71 % (ref 43–77)

## 2011-10-25 NOTE — Pre-Procedure Instructions (Signed)
[place]  10/25/2011   Your procedure is scheduled on:  *Monday, March 11**  Report to [facility] at [facility].  Call this number if you have problems the morning of surgery: [phone]   Remember:   Do not eat food:After Midnight.  May have clear liquids: up to 4 Hours before arrival.  Clear liquids include soda, tea, black coffee, apple or grape juice, broth.  Take these medicines the morning of surgery with A SIP OF WATER: *Xanax, carvedilol, Flonase, Advair,**   Do not wear jewelry, make-up or nail polish.  Do not wear lotions, powders, or perfumes. You may wear deodorant.  Do not shave 48 hours prior to surgery.  Do not bring valuables to the hospital.  Contacts, dentures or bridgework may not be worn into surgery.  Leave suitcase in the car. After surgery it may be brought to your room.  For patients admitted to the hospital, checkout time is 11:00 AM the day of discharge.   Patients discharged the day of surgery will not be allowed to drive home.   Special Instructions: CHG Shower Use Special Wash: 1/2 bottle night before surgery and 1/2 bottle morning of surgery.   Please read over the following fact sheets that you were given: Pain Booklet, Coughing and Deep Breathing, Blood Transfusion Information, Total Joint Packet, MRSA Information and Surgical Site Infection Prevention

## 2011-10-29 MED ORDER — VANCOMYCIN HCL IN DEXTROSE 1-5 GM/200ML-% IV SOLN
1000.0000 mg | INTRAVENOUS | Status: AC
  Administered 2011-10-30: 1000 mg via INTRAVENOUS
  Filled 2011-10-29: qty 200

## 2011-10-30 ENCOUNTER — Ambulatory Visit (HOSPITAL_COMMUNITY): Payer: Medicare Other | Admitting: Anesthesiology

## 2011-10-30 ENCOUNTER — Encounter (HOSPITAL_COMMUNITY): Payer: Self-pay | Admitting: Anesthesiology

## 2011-10-30 ENCOUNTER — Encounter (HOSPITAL_COMMUNITY)
Admission: RE | Disposition: A | Discharge status: Skilled Nursing Facility | Payer: Self-pay | Source: Ambulatory Visit | Attending: Orthopedic Surgery

## 2011-10-30 ENCOUNTER — Inpatient Hospital Stay (HOSPITAL_COMMUNITY)
Admission: RE | Admit: 2011-10-30 | Discharge: 2011-11-02 | DRG: 470 | Disposition: A | Discharge status: Skilled Nursing Facility | Payer: Medicare Other | Source: Ambulatory Visit | Attending: Orthopedic Surgery | Admitting: Orthopedic Surgery

## 2011-10-30 DIAGNOSIS — M659 Unspecified synovitis and tenosynovitis, unspecified site: Secondary | ICD-10-CM | POA: Diagnosis present

## 2011-10-30 DIAGNOSIS — J449 Chronic obstructive pulmonary disease, unspecified: Secondary | ICD-10-CM | POA: Diagnosis present

## 2011-10-30 DIAGNOSIS — I491 Atrial premature depolarization: Secondary | ICD-10-CM | POA: Diagnosis present

## 2011-10-30 DIAGNOSIS — Z01818 Encounter for other preprocedural examination: Secondary | ICD-10-CM

## 2011-10-30 DIAGNOSIS — Z87891 Personal history of nicotine dependence: Secondary | ICD-10-CM | POA: Diagnosis not present

## 2011-10-30 DIAGNOSIS — E0590 Thyrotoxicosis, unspecified without thyrotoxic crisis or storm: Secondary | ICD-10-CM | POA: Diagnosis present

## 2011-10-30 DIAGNOSIS — F419 Anxiety disorder, unspecified: Secondary | ICD-10-CM | POA: Diagnosis present

## 2011-10-30 DIAGNOSIS — I872 Venous insufficiency (chronic) (peripheral): Secondary | ICD-10-CM | POA: Diagnosis present

## 2011-10-30 DIAGNOSIS — J4489 Other specified chronic obstructive pulmonary disease: Secondary | ICD-10-CM | POA: Diagnosis not present

## 2011-10-30 DIAGNOSIS — D62 Acute posthemorrhagic anemia: Secondary | ICD-10-CM | POA: Diagnosis not present

## 2011-10-30 DIAGNOSIS — I10 Essential (primary) hypertension: Secondary | ICD-10-CM | POA: Diagnosis present

## 2011-10-30 DIAGNOSIS — M4120 Other idiopathic scoliosis, site unspecified: Secondary | ICD-10-CM | POA: Diagnosis present

## 2011-10-30 DIAGNOSIS — Z85828 Personal history of other malignant neoplasm of skin: Secondary | ICD-10-CM

## 2011-10-30 DIAGNOSIS — I509 Heart failure, unspecified: Secondary | ICD-10-CM | POA: Diagnosis present

## 2011-10-30 DIAGNOSIS — Z7982 Long term (current) use of aspirin: Secondary | ICD-10-CM

## 2011-10-30 DIAGNOSIS — Z853 Personal history of malignant neoplasm of breast: Secondary | ICD-10-CM | POA: Diagnosis not present

## 2011-10-30 DIAGNOSIS — M25569 Pain in unspecified knee: Secondary | ICD-10-CM | POA: Diagnosis not present

## 2011-10-30 DIAGNOSIS — M1990 Unspecified osteoarthritis, unspecified site: Secondary | ICD-10-CM

## 2011-10-30 DIAGNOSIS — Z5189 Encounter for other specified aftercare: Secondary | ICD-10-CM | POA: Diagnosis not present

## 2011-10-30 DIAGNOSIS — Z79899 Other long term (current) drug therapy: Secondary | ICD-10-CM

## 2011-10-30 DIAGNOSIS — N390 Urinary tract infection, site not specified: Secondary | ICD-10-CM | POA: Diagnosis not present

## 2011-10-30 DIAGNOSIS — E785 Hyperlipidemia, unspecified: Secondary | ICD-10-CM | POA: Diagnosis present

## 2011-10-30 DIAGNOSIS — F411 Generalized anxiety disorder: Secondary | ICD-10-CM | POA: Diagnosis present

## 2011-10-30 DIAGNOSIS — K5900 Constipation, unspecified: Secondary | ICD-10-CM | POA: Diagnosis not present

## 2011-10-30 DIAGNOSIS — M1710 Unilateral primary osteoarthritis, unspecified knee: Principal | ICD-10-CM | POA: Diagnosis present

## 2011-10-30 DIAGNOSIS — M810 Age-related osteoporosis without current pathological fracture: Secondary | ICD-10-CM | POA: Diagnosis present

## 2011-10-30 DIAGNOSIS — IMO0002 Reserved for concepts with insufficient information to code with codable children: Secondary | ICD-10-CM | POA: Diagnosis not present

## 2011-10-30 DIAGNOSIS — I499 Cardiac arrhythmia, unspecified: Secondary | ICD-10-CM | POA: Diagnosis not present

## 2011-10-30 DIAGNOSIS — Z96659 Presence of unspecified artificial knee joint: Secondary | ICD-10-CM | POA: Diagnosis not present

## 2011-10-30 DIAGNOSIS — G8918 Other acute postprocedural pain: Secondary | ICD-10-CM | POA: Diagnosis not present

## 2011-10-30 DIAGNOSIS — Z01812 Encounter for preprocedural laboratory examination: Secondary | ICD-10-CM | POA: Diagnosis not present

## 2011-10-30 HISTORY — PX: TOTAL KNEE ARTHROPLASTY: SHX125

## 2011-10-30 SURGERY — ARTHROPLASTY, KNEE, TOTAL
Anesthesia: General | Site: Knee | Laterality: Left | Wound class: Clean

## 2011-10-30 MED ORDER — BUPIVACAINE-EPINEPHRINE PF 0.5-1:200000 % IJ SOLN
INTRAMUSCULAR | Status: DC | PRN
  Administered 2011-10-30: 30 mL

## 2011-10-30 MED ORDER — NEOSTIGMINE METHYLSULFATE 1 MG/ML IJ SOLN
INTRAMUSCULAR | Status: DC | PRN
  Administered 2011-10-30: 2 mg via INTRAVENOUS

## 2011-10-30 MED ORDER — METOCLOPRAMIDE HCL 10 MG PO TABS: 5.0000 mg | ORAL_TABLET | Freq: Three times a day (TID) | ORAL | Status: DC | PRN

## 2011-10-30 MED ORDER — ATROPINE SULFATE 0.1 MG/ML IJ SOLN
INTRAMUSCULAR | Status: AC
  Administered 2011-10-30: 0.4 mg
  Filled 2011-10-30: qty 10

## 2011-10-30 MED ORDER — CARVEDILOL 12.5 MG PO TABS
12.5000 mg | ORAL_TABLET | Freq: Two times a day (BID) | ORAL | Status: DC
  Administered 2011-10-30 – 2011-11-01 (×4): 12.5 mg via ORAL
  Filled 2011-10-30 (×8): qty 1

## 2011-10-30 MED ORDER — ACETAMINOPHEN 325 MG PO TABS
650.0000 mg | ORAL_TABLET | Freq: Four times a day (QID) | ORAL | Status: DC | PRN
  Administered 2011-10-30: 650 mg via ORAL
  Filled 2011-10-30: qty 2

## 2011-10-30 MED ORDER — OXYCODONE-ACETAMINOPHEN 5-325 MG PO TABS: 1.0000 | ORAL_TABLET | ORAL | Status: DC | PRN

## 2011-10-30 MED ORDER — FLUTICASONE PROPIONATE 50 MCG/ACT NA SUSP
2.0000 | Freq: Every day | NASAL | Status: DC
  Administered 2011-10-30 – 2011-11-02 (×4): 2 via NASAL
  Filled 2011-10-30: qty 16

## 2011-10-30 MED ORDER — ONDANSETRON HCL 4 MG PO TABS: 4.0000 mg | ORAL_TABLET | Freq: Four times a day (QID) | ORAL | Status: DC | PRN

## 2011-10-30 MED ORDER — ACETAMINOPHEN 650 MG RE SUPP: 650.0000 mg | Freq: Four times a day (QID) | RECTAL | Status: DC | PRN

## 2011-10-30 MED ORDER — METOCLOPRAMIDE HCL 5 MG/ML IJ SOLN: 5.0000 mg | Freq: Three times a day (TID) | INTRAMUSCULAR | Status: DC | PRN

## 2011-10-30 MED ORDER — ZOLPIDEM TARTRATE 5 MG PO TABS: 5.0000 mg | ORAL_TABLET | Freq: Every evening | ORAL | Status: DC | PRN

## 2011-10-30 MED ORDER — LACTATED RINGERS IV SOLN
INTRAVENOUS | Status: DC | PRN
  Administered 2011-10-30 (×2): via INTRAVENOUS

## 2011-10-30 MED ORDER — CHLORHEXIDINE GLUCONATE 4 % EX LIQD: 60.0000 mL | Freq: Once | CUTANEOUS | Status: DC

## 2011-10-30 MED ORDER — FENTANYL CITRATE 0.05 MG/ML IJ SOLN
INTRAMUSCULAR | Status: DC | PRN
  Administered 2011-10-30 (×6): 50 ug via INTRAVENOUS

## 2011-10-30 MED ORDER — VITAMIN C 500 MG PO TABS
500.0000 mg | ORAL_TABLET | Freq: Every day | ORAL | Status: DC
  Administered 2011-10-31 – 2011-11-02 (×3): 500 mg via ORAL
  Filled 2011-10-30 (×4): qty 1

## 2011-10-30 MED ORDER — ACETAMINOPHEN 10 MG/ML IV SOLN
INTRAVENOUS | Status: DC | PRN
  Administered 2011-10-30: 1000 mg via INTRAVENOUS

## 2011-10-30 MED ORDER — ALPRAZOLAM 0.25 MG PO TABS
0.2500 mg | ORAL_TABLET | Freq: Three times a day (TID) | ORAL | Status: DC | PRN
  Administered 2011-10-30 (×2): 0.25 mg via ORAL
  Administered 2011-10-31: 0.5 mg via ORAL
  Administered 2011-11-01: 0.25 mg via ORAL
  Filled 2011-10-30 (×2): qty 1
  Filled 2011-10-30: qty 2
  Filled 2011-10-30: qty 1

## 2011-10-30 MED ORDER — LOSARTAN POTASSIUM 50 MG PO TABS
100.0000 mg | ORAL_TABLET | Freq: Every day | ORAL | Status: DC
  Administered 2011-10-30: 100 mg via ORAL
  Filled 2011-10-30 (×4): qty 2

## 2011-10-30 MED ORDER — EPHEDRINE SULFATE 50 MG/ML IJ SOLN
INTRAMUSCULAR | Status: DC | PRN
  Administered 2011-10-30: 10 mg via INTRAVENOUS

## 2011-10-30 MED ORDER — ACETAMINOPHEN 10 MG/ML IV SOLN
INTRAVENOUS | Status: AC
  Filled 2011-10-30: qty 100

## 2011-10-30 MED ORDER — PROPOFOL 10 MG/ML IV EMUL
INTRAVENOUS | Status: DC | PRN
  Administered 2011-10-30: 120 mg via INTRAVENOUS

## 2011-10-30 MED ORDER — MENTHOL 3 MG MT LOZG: 1.0000 | LOZENGE | OROMUCOSAL | Status: DC | PRN

## 2011-10-30 MED ORDER — LACTATED RINGERS IV SOLN: INTRAVENOUS | Status: DC

## 2011-10-30 MED ORDER — DOCUSATE SODIUM 100 MG PO CAPS
100.0000 mg | ORAL_CAPSULE | Freq: Two times a day (BID) | ORAL | Status: DC
  Administered 2011-10-30 – 2011-11-02 (×5): 100 mg via ORAL
  Filled 2011-10-30 (×8): qty 1

## 2011-10-30 MED ORDER — ROCURONIUM BROMIDE 100 MG/10ML IV SOLN
INTRAVENOUS | Status: DC | PRN
  Administered 2011-10-30: 50 mg via INTRAVENOUS

## 2011-10-30 MED ORDER — VITAMIN E 180 MG (400 UNIT) PO CAPS
400.0000 [IU] | ORAL_CAPSULE | Freq: Every day | ORAL | Status: DC
  Administered 2011-11-01 – 2011-11-02 (×2): 400 [IU] via ORAL
  Filled 2011-10-30 (×4): qty 1

## 2011-10-30 MED ORDER — VANCOMYCIN HCL IN DEXTROSE 1-5 GM/200ML-% IV SOLN
1000.0000 mg | Freq: Two times a day (BID) | INTRAVENOUS | Status: AC
  Administered 2011-10-30: 1000 mg via INTRAVENOUS
  Filled 2011-10-30: qty 200

## 2011-10-30 MED ORDER — HYDROMORPHONE HCL PF 1 MG/ML IJ SOLN: 0.5000 mg | INTRAMUSCULAR | Status: DC | PRN

## 2011-10-30 MED ORDER — POVIDONE-IODINE 7.5 % EX SOLN: Freq: Once | CUTANEOUS | Status: DC

## 2011-10-30 MED ORDER — FLUTICASONE-SALMETEROL 250-50 MCG/DOSE IN AEPB
1.0000 | INHALATION_SPRAY | Freq: Two times a day (BID) | RESPIRATORY_TRACT | Status: DC
  Administered 2011-10-31 – 2011-11-02 (×5): 1 via RESPIRATORY_TRACT
  Filled 2011-10-30: qty 14

## 2011-10-30 MED ORDER — ONDANSETRON HCL 4 MG/2ML IJ SOLN: 4.0000 mg | Freq: Four times a day (QID) | INTRAMUSCULAR | Status: DC | PRN

## 2011-10-30 MED ORDER — PHENOL 1.4 % MT LIQD: 1.0000 | OROMUCOSAL | Status: DC | PRN

## 2011-10-30 MED ORDER — TOBRAMYCIN SULFATE 1.2 G IJ SOLR
INTRAMUSCULAR | Status: DC | PRN
  Administered 2011-10-30: 1.2 g

## 2011-10-30 MED ORDER — BISACODYL 5 MG PO TBEC
10.0000 mg | DELAYED_RELEASE_TABLET | Freq: Every day | ORAL | Status: DC
  Administered 2011-10-30 – 2011-10-31 (×2): 10 mg via ORAL
  Filled 2011-10-30 (×2): qty 2

## 2011-10-30 MED ORDER — SODIUM CHLORIDE 0.9 % IR SOLN
Status: DC | PRN
  Administered 2011-10-30: 3000 mL

## 2011-10-30 MED ORDER — GLYCOPYRROLATE 0.2 MG/ML IJ SOLN
INTRAMUSCULAR | Status: DC | PRN
  Administered 2011-10-30 (×2): 0.2 mg via INTRAVENOUS

## 2011-10-30 MED ORDER — HYDROCODONE-ACETAMINOPHEN 5-325 MG PO TABS
1.0000 | ORAL_TABLET | ORAL | Status: DC | PRN
  Administered 2011-10-30 (×2): 1 via ORAL
  Administered 2011-10-30 – 2011-11-02 (×9): 2 via ORAL
  Filled 2011-10-30 (×7): qty 2
  Filled 2011-10-30: qty 1
  Filled 2011-10-30: qty 2
  Filled 2011-10-30: qty 1
  Filled 2011-10-30: qty 2

## 2011-10-30 MED ORDER — ATROPINE SULFATE 0.4 MG/ML IJ SOLN: 0.4000 mg | Freq: Once | INTRAMUSCULAR | Status: DC

## 2011-10-30 MED ORDER — CALCIUM CARBONATE-VITAMIN D 500-200 MG-UNIT PO TABS
1.0000 | ORAL_TABLET | Freq: Every day | ORAL | Status: DC
  Administered 2011-10-31 – 2011-11-02 (×3): 1 via ORAL
  Filled 2011-10-30 (×4): qty 1

## 2011-10-30 MED ORDER — POTASSIUM CHLORIDE IN NACL 20-0.9 MEQ/L-% IV SOLN
INTRAVENOUS | Status: DC
  Administered 2011-10-30: 1000 mL via INTRAVENOUS
  Administered 2011-10-31: 04:00:00 via INTRAVENOUS
  Filled 2011-10-30 (×12): qty 1000

## 2011-10-30 MED ORDER — ONDANSETRON HCL 4 MG/2ML IJ SOLN
INTRAMUSCULAR | Status: DC | PRN
  Administered 2011-10-30: 4 mg via INTRAVENOUS

## 2011-10-30 MED ORDER — ENOXAPARIN SODIUM 30 MG/0.3ML ~~LOC~~ SOLN
30.0000 mg | Freq: Two times a day (BID) | SUBCUTANEOUS | Status: DC
  Administered 2011-10-30 – 2011-11-02 (×6): 30 mg via SUBCUTANEOUS
  Filled 2011-10-30 (×7): qty 0.3

## 2011-10-30 MED ORDER — HYDROMORPHONE HCL PF 1 MG/ML IJ SOLN
0.2500 mg | INTRAMUSCULAR | Status: DC | PRN
  Administered 2011-10-30 (×2): 0.5 mg via INTRAVENOUS

## 2011-10-30 SURGICAL SUPPLY — 67 items
BANDAGE ELASTIC 6 VELCRO ST LF (GAUZE/BANDAGES/DRESSINGS) ×2 IMPLANT
BANDAGE ESMARK 6X9 LF (GAUZE/BANDAGES/DRESSINGS) ×1 IMPLANT
BLADE SAGITTAL 25.0X1.19X90 (BLADE) ×2 IMPLANT
BLADE SAW SGTL 11.0X1.19X90.0M (BLADE) IMPLANT
BLADE SAW SGTL 13.0X1.19X90.0M (BLADE) ×2 IMPLANT
BLADE SURG 10 STRL SS (BLADE) ×4 IMPLANT
BNDG ELASTIC 6X15 VLCR STRL LF (GAUZE/BANDAGES/DRESSINGS) ×2 IMPLANT
BNDG ESMARK 6X9 LF (GAUZE/BANDAGES/DRESSINGS) ×2
BOWL SMART MIX CTS (DISPOSABLE) ×2 IMPLANT
CEMENT HV SMART SET (Cement) ×4 IMPLANT
CLOTH BEACON ORANGE TIMEOUT ST (SAFETY) ×2 IMPLANT
COVER BACK TABLE 24X17X13 BIG (DRAPES) IMPLANT
COVER PROBE W GEL 5X96 (DRAPES) IMPLANT
COVER SURGICAL LIGHT HANDLE (MISCELLANEOUS) ×2 IMPLANT
CUFF TOURNIQUET SINGLE 34IN LL (TOURNIQUET CUFF) ×2 IMPLANT
CUFF TOURNIQUET SINGLE 44IN (TOURNIQUET CUFF) IMPLANT
DRAPE EXTREMITY T 121X128X90 (DRAPE) ×2 IMPLANT
DRAPE INCISE IOBAN 66X45 STRL (DRAPES) ×2 IMPLANT
DRAPE PROXIMA HALF (DRAPES) ×2 IMPLANT
DRAPE U-SHAPE 47X51 STRL (DRAPES) ×2 IMPLANT
DRSG ADAPTIC 3X8 NADH LF (GAUZE/BANDAGES/DRESSINGS) ×2 IMPLANT
DRSG PAD ABDOMINAL 8X10 ST (GAUZE/BANDAGES/DRESSINGS) ×2 IMPLANT
DURAPREP 26ML APPLICATOR (WOUND CARE) ×2 IMPLANT
ELECT CAUTERY BLADE 6.4 (BLADE) ×2 IMPLANT
ELECT REM PT RETURN 9FT ADLT (ELECTROSURGICAL) ×2
ELECTRODE REM PT RTRN 9FT ADLT (ELECTROSURGICAL) ×1 IMPLANT
EVACUATOR 1/8 PVC DRAIN (DRAIN) ×2 IMPLANT
FACESHIELD LNG OPTICON STERILE (SAFETY) ×4 IMPLANT
GLOVE BIO SURGEON STRL SZ7 (GLOVE) ×2 IMPLANT
GLOVE BIOGEL PI IND STRL 7.0 (GLOVE) ×1 IMPLANT
GLOVE BIOGEL PI IND STRL 7.5 (GLOVE) ×1 IMPLANT
GLOVE BIOGEL PI INDICATOR 7.0 (GLOVE) ×1
GLOVE BIOGEL PI INDICATOR 7.5 (GLOVE) ×1
GLOVE SS BIOGEL STRL SZ 7.5 (GLOVE) ×1 IMPLANT
GLOVE SUPERSENSE BIOGEL SZ 7.5 (GLOVE) ×1
GOWN PREVENTION PLUS XLARGE (GOWN DISPOSABLE) ×8 IMPLANT
GOWN STRL NON-REIN LRG LVL3 (GOWN DISPOSABLE) IMPLANT
HANDPIECE INTERPULSE COAX TIP (DISPOSABLE) ×1
HOOD PEEL AWAY FACE SHEILD DIS (HOOD) ×4 IMPLANT
IMMOBILIZER KNEE 22 UNIV (SOFTGOODS) ×2 IMPLANT
INSERT CUSHION PRONEVIEW LG (MISCELLANEOUS) ×2 IMPLANT
KIT BASIN OR (CUSTOM PROCEDURE TRAY) ×2 IMPLANT
KIT ROOM TURNOVER OR (KITS) ×2 IMPLANT
MANIFOLD NEPTUNE II (INSTRUMENTS) ×2 IMPLANT
NS IRRIG 1000ML POUR BTL (IV SOLUTION) ×2 IMPLANT
PACK TOTAL JOINT (CUSTOM PROCEDURE TRAY) ×2 IMPLANT
PAD ARMBOARD 7.5X6 YLW CONV (MISCELLANEOUS) ×4 IMPLANT
PAD CAST 4YDX4 CTTN HI CHSV (CAST SUPPLIES) ×1 IMPLANT
PADDING CAST COTTON 4X4 STRL (CAST SUPPLIES) ×1
PADDING CAST COTTON 6X4 STRL (CAST SUPPLIES) ×2 IMPLANT
POSITIONER HEAD PRONE TRACH (MISCELLANEOUS) ×2 IMPLANT
RUBBERBAND STERILE (MISCELLANEOUS) ×4 IMPLANT
SET HNDPC FAN SPRY TIP SCT (DISPOSABLE) ×1 IMPLANT
SPONGE GAUZE 4X4 12PLY (GAUZE/BANDAGES/DRESSINGS) ×2 IMPLANT
STRIP CLOSURE SKIN 1/2X4 (GAUZE/BANDAGES/DRESSINGS) ×2 IMPLANT
SUCTION FRAZIER TIP 10 FR DISP (SUCTIONS) ×2 IMPLANT
SUT ETHIBOND NAB CT1 #1 30IN (SUTURE) ×4 IMPLANT
SUT MNCRL AB 3-0 PS2 18 (SUTURE) ×2 IMPLANT
SUT VIC AB 0 CT1 27 (SUTURE) ×2
SUT VIC AB 0 CT1 27XBRD ANBCTR (SUTURE) ×2 IMPLANT
SUT VIC AB 2-0 CT1 27 (SUTURE) ×2
SUT VIC AB 2-0 CT1 TAPERPNT 27 (SUTURE) ×2 IMPLANT
SYR 30ML SLIP (SYRINGE) IMPLANT
TOWEL OR 17X24 6PK STRL BLUE (TOWEL DISPOSABLE) ×2 IMPLANT
TOWEL OR 17X26 10 PK STRL BLUE (TOWEL DISPOSABLE) ×2 IMPLANT
TRAY FOLEY CATH 14FR (SET/KITS/TRAYS/PACK) ×2 IMPLANT
WATER STERILE IRR 1000ML POUR (IV SOLUTION) ×4 IMPLANT

## 2011-10-30 NOTE — Progress Notes (Signed)
Report to [facility] as caregiver

## 2011-10-30 NOTE — Preoperative (Signed)
Beta Blockers   Reason not to administer Beta Blockers:Not Applicable 

## 2011-10-30 NOTE — Progress Notes (Signed)
Dr Albertina Parr aware of pt's HR in the 75's in pacu.  Order received to give atropine 0.4 mg IV.

## 2011-10-30 NOTE — Anesthesia Procedure Notes (Signed)
Anesthesia Regional Block:  Femoral nerve block  Pre-Anesthetic Checklist: ,, timeout performed, Correct Patient, Correct Site, Correct Laterality, Correct Procedure, Correct Position, site marked, Risks and benefits discussed, pre-op evaluation,  At surgeon's request and post-op pain management  Laterality: Left  Prep: Maximum Sterile Barrier Precautions used and chloraprep       Needles:  Injection technique: Single-shot  Needle Type: Echogenic Stimulator Needle      Needle Gauge: 22 and 22 G    Additional Needles:  Procedures: nerve stimulator Femoral nerve block  Nerve Stimulator or Paresthesia:  Response: Patellar respose, 0.4 mA,   Additional Responses:   Narrative:  Start time: 10/30/2011 7:03 AM End time: 10/30/2011 7:12 AM Injection made incrementally with aspirations every 5 mL. Anesthesiologist: Lamir Racca,MD  Additional Notes: 2% Lidocaine skin wheel.   Femoral nerve block

## 2011-10-30 NOTE — Progress Notes (Signed)
Orthopedic Tech Progress Note Patient Details:  Mia Contreras May 03, 1929 [id number]  Patient ID: [name], female   DOB: 01-Dec-1928, 78 y.o.   MRN: [id number]   [name] 10/30/2011, 11:56 AM Trapeze bar

## 2011-10-30 NOTE — Anesthesia Postprocedure Evaluation (Signed)
Anesthesia Post Note  Patient: Mia Contreras  Procedure(s) Performed: Procedure(s) (LRB): TOTAL KNEE ARTHROPLASTY (Left)  Anesthesia type: general  Patient location: PACU  Post pain: Pain level controlled  Post assessment: Patient's Cardiovascular Status Stable  Last Vitals:  Filed Vitals:   10/30/11 1130  BP: 154/88  Pulse:   Temp:   Resp:     Post vital signs: Reviewed and stable  Level of consciousness: sedated  Complications: No apparent anesthesia complications

## 2011-10-30 NOTE — Interval H&P Note (Signed)
History and Physical Interval Note:  10/30/2011 7:08 AM  Mia Contreras  has presented today for surgery, with the diagnosis of osteo arthritis lt  The various methods of treatment have been discussed with the patient and family. After consideration of risks, benefits and other options for treatment, the patient has consented to  Procedure(s) (LRB): TOTAL KNEE ARTHROPLASTY (Left) as a surgical intervention .  The patients' history has been reviewed, patient examined, no change in status, stable for surgery.  I have reviewed the patients' chart and labs.  Questions were answered to the patient's satisfaction.     Elsie Saas A

## 2011-10-30 NOTE — H&P (View-Only) (Signed)
Mia Contreras is an 78 y.o. female.   Chief Complaint: left knee DJD HPI: Ms. Martello is an 78 year old seen for follow-up from her left knee degenerative joint disease that continues to bother her with recurrent effusion. She's 2 years status post right total knee replacement and this is doing well but her left knee is getting progressively worse. She has pain with weightbearing activity and increased swelling. We saw her 6 weeks ago and aspirated 60 cc. of fluid from her left knee and injected the knee with Depo-Medrol/Marcaine but she has recurrent swelling and increased pain. The excruciating pain is getting progressively worse, limiting activities of daily living, poses a significant fall risk, and has failed multiple conservative treatments including intraarticular cortisone injections, intraarticular Supartz, bracing, medication and home physical therapy.  Past Medical History  Diagnosis Date  . Cancer   . GOITER, MULTINODULAR 05/06/2010    Dr [name]  . HYPERTHYROIDISM 05/23/2010  . HYPERLIPIDEMIA 08/20/2007  . ANEMIA-NOS 08/20/2007  . ANXIETY 03/16/2007  . CARPAL TUNNEL SYNDROME, RIGHT 01/11/2008  . HYPERTENSION 03/16/2007  . VENOUS INSUFFICIENCY 09/05/2007  . DIVERTICULOSIS, COLON 08/20/2007  . CELLULITIS, LEG, RIGHT 08/20/2007  . OSTEOARTHRITIS 03/16/2007  . OSTEOPOROSIS 08/20/2007  . SCOLIOSIS 01/11/2008  . BREAST CANCER, HX OF 03/16/2007  . SKIN CANCER, HX OF 11/12/2008     R cheek 2010, L Cheek 2011 Dr. Tonia Brooms  . PREMATURE ATRIAL CONTRACTIONS 09/21/2010  . Thyroid nodule   . Cataract     floaters    Past Surgical History  Procedure Date  . Total knee arthroplasty   . Breast lumpectomy 2001  . Abdominal hysterectomy   . Appendectomy 1972  . Mastectomy, partial     s/p left  . Carpal tunnel release   . Total knee arthroplasty 2010    Right-Dr Para March  . Cataract extraction   . Skin cancer excision     face-Dr. Bing Plume  . Xrt     chemo, surgery    Family History    Problem Relation Age of Onset  . Dementia Mother   . Heart disease Mother   . Mental retardation Mother   . Mental illness Mother     dementia  . Hypertension Mother   . Cancer Brother     Prostate Cancer  . Diabetes Other   . Heart attack Father   . Alcohol abuse Father   . Alzheimer's disease Mother    Social History:  reports that she quit smoking about 30 years ago. She does not have any smokeless tobacco history on file. She reports that she does not drink alcohol or use illicit drugs.  Allergies:  Allergies  Allergen Reactions  . Cefuroxime Diarrhea  . Celecoxib Nausea Only  . Codeine Nausea Only  . Tramadol Hcl Nausea Only    Medications Prior to Admission  Medication Sig Dispense Refill  . acetaminophen (APAP ARTHRITIS) 650 MG CR tablet Take 1,300 mg by mouth 2 (two) times daily as needed. Arthritis pain      . ALPRAZolam (XANAX) 0.25 MG tablet Take 0.25-0.5 mg by mouth 3 (three) times daily as needed. For anxiety      . aspirin 325 MG tablet Take 325 mg by mouth daily.       . Calcium Carbonate-Vitamin D (CALCIUM 600+D) 600-400 MG-UNIT per tablet Take 1 tablet by mouth daily.      . carvedilol (COREG) 12.5 MG tablet Take 1 tablet (12.5 mg total) by mouth 2 (two) times daily.  180 tablet  3  . fluticasone (FLONASE) 50 MCG/ACT nasal spray Place 2 sprays into the nose daily.  16 g  11  . Fluticasone-Salmeterol (ADVAIR DISKUS) 250-50 MCG/DOSE AEPB Inhale 1 puff into the lungs 2 (two) times daily.  60 each  0  . losartan (COZAAR) 100 MG tablet Take 1 tablet (100 mg total) by mouth daily.  30 tablet  11  . vitamin C (ASCORBIC ACID) 500 MG tablet Take 500 mg by mouth daily as needed. If getting a cold       . vitamin E 400 UNIT capsule Take 400 Units by mouth daily.       Medications Prior to Admission  Medication Dose Route Frequency Provider Last Rate Last Dose  . TDaP (BOOSTRIX) injection 0.5 mL  0.5 mL Intramuscular Once Rosezetta Schlatter, MD        No results  found for this or any previous visit (from the past 48 hour(s)). No results found.  Review of Systems  Constitutional: Negative.   HENT: Negative.   Eyes: Negative.   Cardiovascular: Negative.   Gastrointestinal: Negative.   Genitourinary: Negative.   Musculoskeletal:       Left knee pain  Skin: Negative.   Neurological: Negative.   Endo/Heme/Allergies: Negative.   Psychiatric/Behavioral: Negative.     Blood pressure 180/82, pulse 78, temperature 97.9 F (36.6 C), temperature source Oral, height 5\' 1"  (1.549 m), weight 61.689 kg (136 lb), SpO2 96.00%. Physical Exam  Constitutional: She is oriented to person, place, and time. She appears well-developed and well-nourished.  HENT:  Head: Normocephalic and atraumatic.  Mouth/Throat: Oropharynx is clear and moist.  Eyes: Conjunctivae and EOM are normal. Pupils are equal, round, and reactive to light.  Neck: Normal range of motion. Neck supple.  Cardiovascular: Normal rate, regular rhythm and normal heart sounds.   Respiratory: Effort normal and breath sounds normal.  GI: Soft. Bowel sounds are normal.  Genitourinary:       Not pertinent to current symptomatology therefore not examined.  Musculoskeletal:       Examination of her left knee reveals 3+ effusion diffuse pain, range of motion from -5 to 110 degrees knee is stable with normal patella tracking. Exam of the right knee reveals well healed total knee incision without swelling or pain, range of motion 0-115 degrees knee is stable with normal patella tracking. Vascular exam: pulses 2+ and symmetric  Neurological: She is alert and oriented to person, place, and time. She has normal reflexes.  Skin: Skin is warm and dry.  Psychiatric: She has a normal mood and affect. Her behavior is normal. Judgment and thought content normal.    Xray: AP,LATERAL, FLEXION, and SUNRISE views show significant joint space narrowing, with periarticular osteophytes, and subchondral  sclerosis.  Assessment Patient Active Problem List  Diagnoses  . GOITER, MULTINODULAR  . HYPERTHYROIDISM  . HYPERLIPIDEMIA  . ANEMIA-NOS  . ANXIETY  . CARPAL TUNNEL SYNDROME, RIGHT  . HYPERTENSION  . VENOUS INSUFFICIENCY  . UPPER RESPIRATORY INFECTION, ACUTE  . DIVERTICULOSIS, COLON  . CELLULITIS, LEG, RIGHT  . OSTEOARTHRITIS  . OSTEOPOROSIS  . SCOLIOSIS  . EDEMA  . ELECTROCARDIOGRAM, ABNORMAL  . WOUND, LEG  . BREAST CANCER, HX OF  . SKIN CANCER, HX OF  . TOBACCO USE, QUIT  . NEOPLASM OF UNCERTAIN BEHAVIOR OF SKIN  . PREMATURE ATRIAL CONTRACTIONS  . CYSTITIS  . Arrhythmia  . Bruit  . CHF (congestive heart failure)  . Preop exam for internal medicine  .  Cerumen impaction  . Cough  . COPD (chronic obstructive pulmonary disease)     Plan  I feel she is a candidate for left total knee replacement due to failed conservative care. Discussed risks benefits and possible complications of the surgery in detail and she understands this completely. Cleared by Dr Johnsie Cancel and Dr Alain Marion.    Omolola Mittman J 10/24/2011, 5:35 PM

## 2011-10-30 NOTE — Transfer of Care (Signed)
Immediate Anesthesia Transfer of Care Note  Patient: Mia Contreras  Procedure(s) Performed: Procedure(s) (LRB): TOTAL KNEE ARTHROPLASTY (Left)  Patient Location: PACU  Anesthesia Type: General and GA combined with regional for post-op pain  Level of Consciousness: awake and alert   Airway & Oxygen Therapy: Patient Spontanous Breathing and Patient connected to nasal cannula oxygen  Post-op Assessment: Report given to PACU RN and Post -op Vital signs reviewed and stable  Post vital signs: Reviewed and stable  Complications: No apparent anesthesia complications

## 2011-10-30 NOTE — Progress Notes (Signed)
Orthopedic Tech Progress Note Patient Details:  Mia Contreras Jun 26, 1929 [id number]  CPM Left Knee CPM Left Knee: On Left Knee Flexion (Degrees): 90  Left Knee Extension (Degrees): 0    [name] 10/30/2011, 11:56 AM

## 2011-10-30 NOTE — Anesthesia Preprocedure Evaluation (Addendum)
Anesthesia Evaluation  Patient identified by MRN, date of birth, ID band Patient awake    Reviewed: Allergy & Precautions, H&P , NPO status , Patient's Chart, lab work & pertinent test results, reviewed documented beta blocker date and time   History of Anesthesia Complications Negative for: history of anesthetic complications  Airway Mallampati: II TM Distance: >3 FB Neck ROM: Full    Dental No notable dental hx. (+) Teeth Intact, Chipped and Partial Upper   Pulmonary pneumonia , COPDformer smoker breath sounds clear to auscultation  Pulmonary exam normal       Cardiovascular Exercise Tolerance: Good hypertension, On Medications and On Home Beta Blockers +CHF + dysrhythmias (PVCs) Rhythm:Regular Rate:Normal     Neuro/Psych Anxiety  Neuromuscular disease (scoliosis) negative psych ROS   GI/Hepatic negative GI ROS, Neg liver ROS, hiatal hernia,   Endo/Other  Hyperthyroidism goiter  Renal/GU negative Renal ROS  negative genitourinary   Musculoskeletal  (+) Arthritis -, Osteoarthritis,    Abdominal   Peds  Hematology negative hematology ROS (+)   Anesthesia Other Findings   Reproductive/Obstetrics negative OB ROS                        Anesthesia Physical Anesthesia Plan  ASA: II  Anesthesia Plan: General   Post-op Pain Management:    Induction: Intravenous  Airway Management Planned: LMA  Additional Equipment:   Intra-op Plan:   Post-operative Plan: Extubation in OR  Informed Consent: I have reviewed the patients History and Physical, chart, labs and discussed the procedure including the risks, benefits and alternatives for the proposed anesthesia with the patient or authorized representative who has indicated his/her understanding and acceptance.   Dental advisory given  Plan Discussed with: CRNA  Anesthesia Plan Comments:         Anesthesia Quick Evaluation

## 2011-10-30 NOTE — Brief Op Note (Signed)
10/30/2011  9:20 AM  PATIENT:  Mia Contreras  78 y.o. female  PRE-OPERATIVE DIAGNOSIS:  osteo arthritis   POST-OPERATIVE DIAGNOSIS:  osteo arthritis   PROCEDURE:  Procedure(s) (LRB): TOTAL KNEE ARTHROPLASTY (Left)  SURGEON:  Surgeon(s) and Role:    * Lorn Junes, MD - Primary  PHYSICIAN ASSISTANT: Kirstin Shepperson PA-C  ASSISTANTS: Kirstin Shepperson PA-C   ANESTHESIA:   general  EBL:  Total I/O In: 1000 [I.V.:1000] Out: 46 [Urine:800; Blood:25]  BLOOD ADMINISTERED:none  DRAINS: (2) Hemovact drain(s) in the left knee with  Suction Clamped   LOCAL MEDICATIONS USED:  NONE  SPECIMEN:  Source of Specimen:  left knee synovium  DISPOSITION OF SPECIMEN:  PATHOLOGY  COUNTS:  YES  TOURNIQUET:   Total Tourniquet Time Documented: Thigh (Left) - 46 minutes  DICTATION: .Note written in EPIC  PLAN OF CARE: Admit to inpatient   PATIENT DISPOSITION:  PACU - hemodynamically stable.   Delay start of Pharmacological VTE agent (>24hrs) due to surgical blood loss or risk of bleeding: no

## 2011-10-31 LAB — CBC
HCT: 28.7 % — ABNORMAL LOW (ref 36.0–46.0)
Hemoglobin: 9.4 g/dL — ABNORMAL LOW (ref 12.0–15.0)
MCH: 30.2 pg (ref 26.0–34.0)
MCV: 92.3 fL (ref 78.0–100.0)
Platelets: 248 10*3/uL (ref 150–400)
RBC: 3.11 MIL/uL — ABNORMAL LOW (ref 3.87–5.11)
WBC: 10.9 10*3/uL — ABNORMAL HIGH (ref 4.0–10.5)

## 2011-10-31 LAB — BASIC METABOLIC PANEL
CO2: 28 mEq/L (ref 19–32)
Chloride: 105 mEq/L (ref 96–112)
Creatinine, Ser: 0.86 mg/dL (ref 0.50–1.10)
Glucose, Bld: 121 mg/dL — ABNORMAL HIGH (ref 70–99)

## 2011-10-31 MED ORDER — SODIUM CHLORIDE 0.9 % IV BOLUS (SEPSIS): 500.0000 mL | Freq: Once | INTRAVENOUS | Status: DC

## 2011-10-31 MED ORDER — SODIUM CHLORIDE 0.9 % IV BOLUS (SEPSIS)
500.0000 mL | Freq: Once | INTRAVENOUS | Status: AC
  Administered 2011-10-31: 500 mL via INTRAVENOUS

## 2011-10-31 NOTE — Progress Notes (Signed)
Physical Therapy Evaluation Patient Details Name: Mia Contreras MRN: [id number] DOB: [birth year] Today's Date: 10/31/2011  Problem List:  Patient Active Problem List  Diagnoses  . GOITER, MULTINODULAR  . HYPERTHYROIDISM  . HYPERLIPIDEMIA  . ANEMIA-NOS  . ANXIETY  . CARPAL TUNNEL SYNDROME, RIGHT  . HYPERTENSION  . VENOUS INSUFFICIENCY  . UPPER RESPIRATORY INFECTION, ACUTE  . DIVERTICULOSIS, COLON  . CELLULITIS, LEG, RIGHT  . OSTEOARTHRITIS  . OSTEOPOROSIS  . SCOLIOSIS  . EDEMA  . ELECTROCARDIOGRAM, ABNORMAL  . WOUND, LEG  . BREAST CANCER, HX OF  . SKIN CANCER, HX OF  . TOBACCO USE, QUIT  . NEOPLASM OF UNCERTAIN BEHAVIOR OF SKIN  . PREMATURE ATRIAL CONTRACTIONS  . CYSTITIS  . Arrhythmia  . Bruit  . CHF (congestive heart failure)  . Preop exam for internal medicine  . Cerumen impaction  . Cough  . COPD (chronic obstructive pulmonary disease)    Past Medical History:  Past Medical History  Diagnosis Date  . GOITER, MULTINODULAR 05/06/2010    Dr [name]  . HYPERTHYROIDISM 05/23/2010  . HYPERLIPIDEMIA 08/20/2007  . ANEMIA-NOS 08/20/2007  . ANXIETY 03/16/2007  . CARPAL TUNNEL SYNDROME, RIGHT 01/11/2008  . HYPERTENSION 03/16/2007  . VENOUS INSUFFICIENCY 09/05/2007  . DIVERTICULOSIS, COLON 08/20/2007  . CELLULITIS, LEG, RIGHT 08/20/2007  . OSTEOARTHRITIS 03/16/2007  . OSTEOPOROSIS 08/20/2007  . SCOLIOSIS 01/11/2008  . BREAST CANCER, HX OF 03/16/2007  . SKIN CANCER, HX OF 11/12/2008     R cheek 2010, L Cheek 2011 Dr. Tonia Brooms  . PREMATURE ATRIAL CONTRACTIONS 09/21/2010  . Thyroid nodule   . Cataract     floaters  . Cancer     left breaast  . Pneumonia 07/2011    took abx for several weeks   Past Surgical History:  Past Surgical History  Procedure Date  . Total knee arthroplasty   . Breast lumpectomy 2001  . Abdominal hysterectomy   . Appendectomy 1972  . Mastectomy, partial     s/p left  . Carpal tunnel release   . Total knee arthroplasty 2010    Right-Dr  Para March  . Cataract extraction   . Skin cancer excision     face-Dr. Bing Plume  . Xrt     chemo, surgery  . Eye surgery     cataract ext    PT Assessment/Plan/Recommendation PT Assessment Clinical Impression Statement: 78 yo female s/p LTKA presents to PT with decr functional mobility and impairments listed below; will benefit from acute PT to maximize independence and safety and prep for continued rehab at SNF PT Recommendation/Assessment: Patient will need skilled PT in the acute care venue PT Problem List: Decreased strength;Decreased range of motion;Decreased activity tolerance;Decreased balance;Decreased mobility;Decreased knowledge of use of DME;Pain;Decreased knowledge of precautions Barriers to Discharge: None PT Therapy Diagnosis : Difficulty walking;Acute pain PT Plan PT Frequency: 7X/week PT Treatment/Interventions: DME instruction;Gait training;Stair training;Functional mobility training;Therapeutic activities;Therapeutic exercise;Balance training;Patient/family education PT Recommendation Follow Up Recommendations: Skilled nursing facility Equipment Recommended: Defer to next venue PT Goals  Acute Rehab PT Goals PT Goal Formulation: With patient Time For Goal Achievement: 7 days Pt will go Supine/Side to Sit: with modified independence PT Goal: Supine/Side to Sit - Progress: Goal set today Pt will go Sit to Supine/Side: with supervision PT Goal: Sit to Supine/Side - Progress: Goal set today Pt will go Sit to Stand: with supervision PT Goal: Sit to Stand - Progress: Goal set today Pt will go Stand to Sit: with supervision PT Goal: Stand to Sit - Progress:  Goal set today Pt will Ambulate: >150 feet;with supervision;with rolling walker PT Goal: Ambulate - Progress: Goal set today Pt will Go Up / Down Stairs: 1-2 stairs;with supervision;with rolling walker PT Goal: Up/Down Stairs - Progress: Goal set today Pt will Perform Home Exercise Program: Independently PT Goal:  Perform Home Exercise Program - Progress: Goal set today  PT Evaluation Precautions/Restrictions  Precautions Precautions: Knee Required Braces or Orthoses: Yes Knee Immobilizer: On when out of bed or walking;Discontinue post op day 2 Restrictions Weight Bearing Restrictions: Yes LLE Weight Bearing: Weight bearing as tolerated Prior [place] Lives With: Alone Receives Help From: Family (siblings are coming to town to assist her into April) Type of Home: House Home Layout: One level Home Access: Stairs to enter Additional Comments: Pt is planning on dc'ing to [facility] for rehab Prior Function Level of Independence: Independent with homemaking with ambulation Able to Take Stairs?: Yes Driving: Yes Cognition Cognition Arousal/Alertness: Awake/alert Overall Cognitive Status: Appears within functional limits for tasks assessed Orientation Level: Oriented X4 Sensation/Coordination Sensation Light Touch: Appears Intact Coordination Gross Motor Movements are Fluid and Coordinated: Yes Fine Motor Movements are Fluid and Coordinated: Yes Extremity Assessment RUE Assessment RUE Assessment: Within Functional Limits LUE Assessment LUE Assessment: Within Functional Limits RLE Assessment RLE Assessment: Within Functional Limits LLE Assessment LLE Assessment: Exceptions to [facility] LLE Strength LLE Overall Strength Comments: Grossly decr AROM and strength, limited by pain postop; quad activation present, though weak Mobility (including Balance) Bed Mobility Bed Mobility: Yes Supine to Sit: 4: Min assist;HOB flat;With rails Supine to Sit Details (indicate cue type and reason): cues for safety, technique, physical assist for LLE Sitting - Scoot to Edge of Bed: 4: Min assist (guard assist) Sitting - Scoot to Edge of Bed Details (indicate cue type and reason): cues for safety, but pretty smooth transition Transfers Transfers: Yes Sit to Stand: 3: Mod assist;From  bed Sit to Stand Details (indicate cue type and reason): cues for safety, technique, optimal LLE positioning with KI, and hand placement Stand to Sit: 1: +2 Total assist;Patient percentage (comment);To chair/3-in-1 Stand to Sit Details: pt became quite anxious with ambulation as her knee tended to buckle; she lost her balance backward, and was assisted to sit in the chair (that was pushed behind her) Ambulation/Gait Ambulation/Gait: Yes Ambulation/Gait Assistance: 1: +2 Total assist;Patient percentage (comment) (pt=65-40%) Ambulation/Gait Assistance Details (indicate cue type and reason): cues for gait sequence, and to activate L quad for stance stability; pt tended to buckle in stance (with KI), and required cues and physical blocking of l knee for sttance stability; second person for safety, and pushing chair behind Ambulation Distance (Feet): 15 Feet Assistive device: Rolling walker Gait Pattern: Step-to pattern    Exercise  Total Joint Exercises Quad Sets: AROM;Left;10 reps;Supine Heel Slides: AAROM;Left;5 reps;Supine End of Session PT - End of Session Equipment Utilized During Treatment: Gait belt;Left knee immobilizer Activity Tolerance:  (Pt became very anxious with loss of balance) Patient left: in chair;with call bell in reach General Behavior During Session: [facility] for tasks performed Cognition: [facility] for tasks performed  Roney Marion [facility] [place], [place]  10/31/2011, 10:32 AM

## 2011-10-31 NOTE — Progress Notes (Signed)
Physical Therapy Note   10/31/11 1721  PT Visit Information  Last PT Received On 10/31/11  Precautions  Precautions Knee  Knee Immobilizer On when out of bed or walking;Discontinue post op day 2  Restrictions  LLE Weight Bearing WBAT  Bed Mobility  Sit to Supine 1: +2 Total assist;Patient percentage (comment);Other (comment) (pt=50%)  Sit to Supine - Details (indicate cue type and reason) physical assist for LLE and to control trunk to bed  Transfers  Sit to Stand 3: Mod assist;From chair/3-in-1  Sit to Stand Details (indicate cue type and reason) cues for safety, hand placement  Stand to Sit 3: Mod assist;To bed  Stand to Sit Details cues for optimal positioning at EOB, safety, and hand placement  Ambulation/Gait  Ambulation/Gait Assistance 1: +2 Total assist;Patient percentage (comment) (pt=70%)  Ambulation/Gait Assistance Details (indicate cue type and reason) continues to require physical blocking of L knee in stance, but still much imporved over am session  Ambulation Distance (Feet) 15 Feet  Assistive device Rolling walker  Gait Pattern Step-to pattern  PT - End of Session  Equipment Utilized During Treatment Gait belt;Left knee immobilizer  Activity Tolerance Patient tolerated treatment well  Patient left in bed;in CPM;with call bell in reach  Nurse Communication Mobility status for transfers;Mobility status for ambulation  General  Behavior During Session [facility] for tasks performed  Cognition [facility] for tasks performed  PT - Assessment/Plan  Comments on Treatment Session Slow, but good progress; need to work on quad acivation in stance for knee control and safety  PT Plan Discharge plan remains appropriate  PT Frequency 7X/week  Recommendations for Other Services OT consult  Follow Up Recommendations Skilled nursing facility  Equipment Recommended Defer to next venue  Acute Rehab PT Goals  Time For Goal Achievement 7 days  Pt will go Sit to Supine/Side with supervision   PT Goal: Sit to Supine/Side - Progress Progressing toward goal  Pt will go Sit to Stand with supervision  PT Goal: Sit to Stand - Progress Progressing toward goal  Pt will go Stand to Sit with supervision  PT Goal: Stand to Sit - Progress Progressing toward goal  Pt will Ambulate >150 feet;with supervision;with rolling walker  PT Goal: Ambulate - Progress Progressing toward goal    Roney Marion, [place]

## 2011-10-31 NOTE — Op Note (Signed)
Mia Contreras, Mia Contreras NO.:  [id number]  MEDICAL RECORD NO.:  [id number]  LOCATION:  5007                         FACILITY:  [facility]  PHYSICIAN:  Audree Camel. Noemi Contreras, M.D. DATE OF BIRTH:  1929/05/07  DATE OF PROCEDURE:  10/30/2011 DATE OF DISCHARGE:                              OPERATIVE REPORT   PREOPERATIVE DIAGNOSIS:  Left knee degenerative joint disease.  POSTOPERATIVE DIAGNOSES: 1. Left knee degenerative joint disease. 2. Left knee synovitis with hypertrophic synovium.  PROCEDURE: 1. Left total knee replacement using DePuy cemented total knee system     with #3 cemented femur, #3 cemented tibia with 15-mm polyethylene     RP tibial spacer, and 32-mm polyethylene cemented patella. 2. Tobramycin impregnated cement. 3. Synovial biopsies.  SURGEON:  Audree Camel. Noemi Chapel, MD  ASSISTANT:  Matthew Saras, PA-C  ANESTHESIA:  General.  OPERATIVE TIME:  1 hour and 40 minutes.  COMPLICATIONS:  None.  DESCRIPTION OF PROCEDURE:  Ms. Bielen was brought to the operating room on October 30, 2011, after a femoral nerve block was placed in the holding room by Anesthesia.  She was placed on operative table in supine position.  She received vancomycin 1 g IV preoperatively for prophylaxis.  After being placed under general anesthesia, she had a Foley catheter placed under sterile conditions.  Her left knee was examined.  Range of motion from -5 to 125 degrees, mild valgus deformity.  Knee stable ligamentous exam with normal patellar tracking. Left leg was prepped using sterile DuraPrep and draped using sterile technique.  Time-out procedure was called and the correct left knee identified.  Left leg was exsanguinated and a thigh tourniquet elevated at 365 mm.  Initially, through a 15-cm longitudinal incision based over the patella, initial exposure was made.  Underlying subcutaneous tissues were incised along with skin incision.  A median arthrotomy was performed  revealing an excessive amount of normal-appearing joint fluid. The articular surfaces were inspected.  She had grade 4 changes laterally, grade 3 changes medially, and grade 3 and 4 changes in the patellofemoral joint.  She had very hypertrophic fibrinous findings around her suprapatellar pouch and synovium and large amounts of hypotrophic, thick, nodular synovial tissue was removed and sent for cultures as well as pathologic review.  Intraoperative frozen section of the synovium showed acute and chronic inflammation with 4-5 polys per high-power field but the Gram stain was negative for white cells or organisms.  The medial and lateral meniscal remnants were removed as well as the anterior cruciate ligament.  Intramedullary drill was then drilled up the femoral canal for placement of distal femoral cutting jig which was placed in the appropriate manner of rotation and a distal 11 mm cut was made.  The distal femur was incised.  #3 was found to be the appropriate size.  #3 cutting jig was placed in the appropriate manner of external rotation and then these cuts were made.  The proximal tibia was then exposed.  The tibial spines were removed with an oscillating saw.  Intramedullary drill was drilled down the tibial canal for placement of proximal tibial cutting jig, which was placed in the appropriate manner of rotation and a proximal 2  mm cut was made based off the lateral or lower side.  Spacer blocks were then placed in flexion and extension.  15 mm blocks gave excellent balancing, excellent stability, and excellent correction of her flexion and valgus deformities.  #3 tibial base plate trial was then placed on the cut tibial surface with an excellent fit and the keel cut was made.  The PCL box cutter was then placed on the distal femur and these cuts were made. At this point, with the #3 femoral trial in place, and #3 tibial base plate trial and a [id number] polyethylene RP tibial spacer,  knee was reduced, taken through range of motion from 0-125 degrees with excellent stability and excellent correction of her flexion and valgus deformities and normal patellar tracking.  A resurfacing 8-mm cut was then made on the patella and 3 locking holes placed for a 32-mm polyethylene patellar trial.  Patellofemoral tracking was then re-evaluated and found to be normal.  At this point, it was felt that all the trial components were of excellent size, fit, and stability.  They were then removed.  The knee was then jet lavage irrigated with 3 L of saline.  Proximal tibia was then exposed and the #3 tibial base plate with tobramycin impregnated cement backing was hammered in position with an excellent fit with excess cement being removed around the edges.  A #3 femoral component with cement backing was hammered in position also with an excellent fit with excess cement being removed from around the edges. The 15-mm polyethylene RP tibial spacer was placed on tibial baseplate. The knee reduced, taken through range of motion from 0-125 degrees with excellent stability and excellent correction of her flexion and valgus deformities.  A 32-mm polyethylene cemented patella was then placed in its position and held there with a clamp.  After the cement hardened, again patellofemoral tracking was evaluated and found to be normal.  At this point, it was felt that all the components were of excellent size, fit, and stability.  The knee was further irrigated with saline. Tourniquet was released.  Hemostasis was obtained with cautery.  The arthrotomy was then closed with #1 Ethibond suture over 2 medium Hemovac drains.  Subcutaneous tissues were closed 0 and 2-0 Vicryl, subcuticular layer closed with 4-0 Monocryl.  Sterile dressings were applied, and a long-leg splint.  The patient awakened, extubated, and taken to recovery room in stable condition.  Needle and sponge counts were correct x2 at the end  of the case.  Neurovascular status was normal.  Pulses 2+ and symmetric.     Mia Contreras, M.D.     RAW/MEDQ  D:  10/31/2011  T:  10/31/2011  Job:  [id number]

## 2011-10-31 NOTE — Progress Notes (Signed)
UR COMPLETED  

## 2011-10-31 NOTE — Progress Notes (Signed)
Orthopedic Tech Progress Note Patient Details:  Mia Contreras 1928/12/10 [id number]  Patient ID: Mia Contreras, female   DOB: 01/19/1929, 78 y.o.   MRN: [id number] Confirmed patient has knee immobilizer  Denni France T 10/31/2011, 1:40 PM

## 2011-10-31 NOTE — Progress Notes (Signed)
Pt's BP at 1055 was 90/49, HR 94. Asymptomatic. Luna Glasgow, PA was notified. Losaartan held. Alferd Patee

## 2011-10-31 NOTE — Progress Notes (Signed)
OT Screen OT order received. Charrt reviewed. Pt plans to D/C to SNF for rehab. Will defer OT to SNF. OT signing off. [facility], OTR/L  [phone] 10/31/2011

## 2011-10-31 NOTE — Progress Notes (Signed)
Clinical Social Work-CSw met with pr at bedside assessment in shadow chart-pt pre registered with [facility] Place-CSW following and will facilitate d/c when appropriate-Scarleth Brame-MSW, [phone]

## 2011-10-31 NOTE — Progress Notes (Signed)
Patient ID: Mia Contreras, female   DOB: 1929/07/19, 78 y.o.   MRN: [id number] PATIENT ID: AHSLEE GUIDROZ  MRN: [id number]  DOB/AGE:  07-Oct-1928 / 78 y.o.  1 Day Post-Op Procedure(s) (LRB): TOTAL KNEE ARTHROPLASTY (Left)    PROGRESS NOTE Subjective: Patient is alert, oriented, no Nausea, no Vomiting, no passing gas, no Bowel Movement. Taking PO well. Denies SOB, Chest or Calf Pain. Using Incentive Spirometer, PAS in place. Ambulate not yet, CPM 0-90 Patient reports pain as 8 on 0-10 scale  .    Objective: Vital signs in last 24 hours: Filed Vitals:   10/30/11 1214 10/30/11 2311 10/31/11 0100 10/31/11 0648  BP: 156/77 92/54  108/62  Pulse: 56 90  79  Temp: 96.8 F (36 C) 101 F (38.3 C) 98.7 F (37.1 C) 99 F (37.2 C)  TempSrc: Oral     Resp: 16 18  18   Height:      Weight:      SpO2: 94% 95%  95%      Intake/Output from previous day: I/O last 3 completed shifts: In: 2800 [P.O.:600; I.V.:2200] Out: [id number] [Urine:3875; Drains:110; Blood:25]   Intake/Output this shift:     LABORATORY DATA:  Basename 10/31/11 0555  WBC 10.9*  HGB 9.4*  HCT 28.7*  PLT 248  NA 139  K 3.9  CL 105  CO2 28  BUN 11  CREATININE 0.86  GLUCOSE 121*  GLUCAP --  INR --  CALCIUM 8.1*    Examination: ABD soft Neurovascular intact Sensation intact distally Intact pulses distally Dorsiflexion/Plantar flexion intact Incision: dressing C/D/I}  Assessment:   1 Day Post-Op Procedure(s) (LRB): TOTAL KNEE ARTHROPLASTY (Left) ADDITIONAL DIAGNOSIS:  Acute Blood Loss Anemia  Plan: PT/OT WBAT, CPM 5/hrs day until ROM 0-90 degrees, then D/C CPM DVT Prophylaxis:  Lovenox\Coumadin bridge target INR 1.5-2.0 DISCHARGE PLAN: Skilled Nursing Facility/Rehab DISCHARGE NEEDS: fluid bolus times two,dressing change     [place] Moyd J 10/31/2011, 7:48 AM

## 2011-10-31 NOTE — Progress Notes (Signed)
Clinical Social Work-FL2 placed in shadow chart for signature-Bensen Chadderdon-MSW, [phone]

## 2011-11-01 ENCOUNTER — Encounter (HOSPITAL_COMMUNITY): Payer: Self-pay | Admitting: Orthopedic Surgery

## 2011-11-01 DIAGNOSIS — D62 Acute posthemorrhagic anemia: Secondary | ICD-10-CM | POA: Diagnosis not present

## 2011-11-01 LAB — URINALYSIS, ROUTINE W REFLEX MICROSCOPIC
Nitrite: NEGATIVE
Protein, ur: NEGATIVE mg/dL
Urobilinogen, UA: 0.2 mg/dL (ref 0.0–1.0)

## 2011-11-01 LAB — CBC
HCT: 26.3 % — ABNORMAL LOW (ref 36.0–46.0)
MCH: 30.5 pg (ref 26.0–34.0)
MCV: 94.3 fL (ref 78.0–100.0)
RBC: 2.79 MIL/uL — ABNORMAL LOW (ref 3.87–5.11)
RDW: 16.2 % — ABNORMAL HIGH (ref 11.5–15.5)
WBC: 13.4 10*3/uL — ABNORMAL HIGH (ref 4.0–10.5)

## 2011-11-01 LAB — URINE MICROSCOPIC-ADD ON

## 2011-11-01 LAB — BASIC METABOLIC PANEL
BUN: 22 mg/dL (ref 6–23)
CO2: 27 mEq/L (ref 19–32)
Calcium: 8.8 mg/dL (ref 8.4–10.5)
Chloride: 103 mEq/L (ref 96–112)
Creatinine, Ser: 1.18 mg/dL — ABNORMAL HIGH (ref 0.50–1.10)

## 2011-11-01 MED ORDER — BISACODYL 10 MG RE SUPP
10.0000 mg | Freq: Two times a day (BID) | RECTAL | Status: DC
  Administered 2011-11-01: 10 mg via RECTAL
  Filled 2011-11-01: qty 1

## 2011-11-01 MED ORDER — SODIUM CHLORIDE 0.9 % IV BOLUS (SEPSIS)
500.0000 mL | Freq: Once | INTRAVENOUS | Status: AC
  Administered 2011-11-01: 500 mL via INTRAVENOUS

## 2011-11-01 NOTE — Progress Notes (Signed)
Physical Therapy Treatment Patient Details Name: Mia Contreras MRN: [id number] DOB: Feb 10, 1929 Today's Date: 11/01/2011  PT Assessment/Plan  PT - Assessment/Plan Comments on Treatment Session: Much improved this session; pt seems to have increased condfidence as well PT Plan: Discharge plan remains appropriate PT Frequency: 7X/week Recommendations for Other Services: OT consult Follow Up Recommendations: Skilled nursing facility Equipment Recommended: Defer to next venue PT Goals  Acute Rehab PT Goals Time For Goal Achievement: 7 days Pt will go Supine/Side to Sit: with modified independence PT Goal: Supine/Side to Sit - Progress: Progressing toward goal Pt will go Sit to Stand: with supervision PT Goal: Sit to Stand - Progress: Progressing toward goal Pt will go Stand to Sit: with supervision PT Goal: Stand to Sit - Progress: Progressing toward goal Pt will Ambulate: >150 feet;with supervision;with rolling walker PT Goal: Ambulate - Progress: Progressing toward goal  PT Treatment Precautions/Restrictions  Precautions Precautions: Knee Required Braces or Orthoses: Yes Knee Immobilizer: On when out of bed or walking;Discontinue post op day 2 Restrictions Weight Bearing Restrictions: Yes LLE Weight Bearing: Weight bearing as tolerated Mobility (including Balance) Bed Mobility Supine to Sit: 4: Min assist Supine to Sit Details (indicate cue type and reason): safety cues Sitting - Scoot to Edge of Bed: 4: Min assist (guard assist) Sitting - Scoot to Edge of Bed Details (indicate cue type and reason): safety cues Transfers Sit to Stand: 4: Min assist;From bed Sit to Stand Details (indicate cue type and reason): cues for safety, technique Stand to Sit: 4: Min assist;To chair/3-in-1;With upper extremity assist Stand to Sit Details: Much improved! cues for safety, technique Ambulation/Gait Ambulation/Gait: Yes Ambulation/Gait Assistance: 4: Min assist Ambulation/Gait  Assistance Details (indicate cue type and reason): cues for sequence, and to activate R quad for stance stability; Noted small buckling, but pt was able to safely support herself on RW Ambulation Distance (Feet): 30 Feet Assistive device: Rolling walker Gait Pattern: Step-to pattern       End of Session PT - End of Session Equipment Utilized During Treatment: Gait belt Activity Tolerance: Patient tolerated treatment well Patient left: in chair (wit RN) Nurse Communication: Mobility status for transfers;Mobility status for ambulation General Behavior During Session: [facility] for tasks performed Cognition: [facility] for tasks performed  Mia Contreras [facility] [place], [place]  11/01/2011, 5:40 PM

## 2011-11-01 NOTE — Progress Notes (Signed)
Patient ID: Mia Contreras, female   DOB: Oct 31, 1928, 78 y.o.   MRN: [id number] PATIENT ID: Mia Contreras  MRN: [id number]  DOB/AGE:  May 01, 1929 / 78 y.o.  2 Days Post-Op Procedure(s) (LRB): TOTAL KNEE ARTHROPLASTY (Left)    PROGRESS NOTE Subjective: Patient is alert, oriented, no Nausea, no Vomiting, yes} passing gas, no Bowel Movement. Taking PO yes. Denies SOB, Chest or Calf Pain. Using Incentive Spirometer, PAS in place. Ambulate walked to sink, CPM 0-85 Patient reports pain as 5 on 0-10 scale  .    Objective: Vital signs in last 24 hours: Filed Vitals:   10/31/11 1407 10/31/11 2017 10/31/11 2110 11/01/11 0655  BP: 93/44 107/65  120/50  Pulse: 98 93  98  Temp: 99 F (37.2 C) 98.4 F (36.9 C)  100 F (37.8 C)  TempSrc: Oral     Resp: 18 16  18   Height:      Weight:      SpO2: 90% 92% 92% 95%      Intake/Output from previous day: I/O last 3 completed shifts: In: 3000 [P.O.:1200; I.V.:800; IV Piggyback:1000] Out: 1155 [Urine:1125; Drains:30]   Intake/Output this shift:     LABORATORY DATA:  Basename 11/01/11 0655 10/31/11 0555  WBC 13.4* 10.9*  HGB 8.5* 9.4*  HCT 26.3* 28.7*  PLT 255 248  NA -- 139  K -- 3.9  CL -- 105  CO2 -- 28  BUN -- 11  CREATININE -- 0.86  GLUCOSE -- 121*  GLUCAP -- --  INR -- --  CALCIUM -- 8.1*    Examination: ABD soft Neurovascular intact Sensation intact distally Dorsiflexion/Plantar flexion intact Incision: scant drainage urinary frequency, low grade fever}  Assessment:   2 Days Post-Op Procedure(s) (LRB): TOTAL KNEE ARTHROPLASTY (Left) ADDITIONAL DIAGNOSIS:  Acute Blood Loss Anemia, Hypertension, Cardiac Arrythmia anxiety, fever, COPD and urinary frequency  Plan: PT/OT WBAT, CPM 5/hrs day until ROM 0-90 degrees, then D/C CPM DVT Prophylaxis:  Lovenox\Coumadin bridge target INR 1.5-2.0 DISCHARGE PLAN: Skilled Nursing Facility/Rehab Low grade fever, will get urinalysis and urine culture, encourage incentive  spirometry. Constipation  Dulcolax suppository Up with physical therapy To [name] tomorrow Fluid bolus times 1     Mia Contreras J 11/01/2011, 7:49 AM

## 2011-11-02 DIAGNOSIS — I499 Cardiac arrhythmia, unspecified: Secondary | ICD-10-CM | POA: Diagnosis not present

## 2011-11-02 DIAGNOSIS — Z5189 Encounter for other specified aftercare: Secondary | ICD-10-CM | POA: Diagnosis not present

## 2011-11-02 DIAGNOSIS — F411 Generalized anxiety disorder: Secondary | ICD-10-CM | POA: Diagnosis not present

## 2011-11-02 DIAGNOSIS — M1710 Unilateral primary osteoarthritis, unspecified knee: Secondary | ICD-10-CM | POA: Diagnosis not present

## 2011-11-02 DIAGNOSIS — D62 Acute posthemorrhagic anemia: Secondary | ICD-10-CM | POA: Diagnosis not present

## 2011-11-02 DIAGNOSIS — J449 Chronic obstructive pulmonary disease, unspecified: Secondary | ICD-10-CM | POA: Diagnosis not present

## 2011-11-02 DIAGNOSIS — Z96659 Presence of unspecified artificial knee joint: Secondary | ICD-10-CM | POA: Diagnosis not present

## 2011-11-02 DIAGNOSIS — N390 Urinary tract infection, site not specified: Secondary | ICD-10-CM | POA: Diagnosis not present

## 2011-11-02 DIAGNOSIS — M1990 Unspecified osteoarthritis, unspecified site: Secondary | ICD-10-CM | POA: Diagnosis not present

## 2011-11-02 DIAGNOSIS — I10 Essential (primary) hypertension: Secondary | ICD-10-CM | POA: Diagnosis not present

## 2011-11-02 DIAGNOSIS — K5900 Constipation, unspecified: Secondary | ICD-10-CM | POA: Diagnosis not present

## 2011-11-02 LAB — BASIC METABOLIC PANEL
Chloride: 102 mEq/L (ref 96–112)
Creatinine, Ser: 1.2 mg/dL — ABNORMAL HIGH (ref 0.50–1.10)
GFR calc Af Amer: 48 mL/min — ABNORMAL LOW (ref >90)
Potassium: 4.6 mEq/L (ref 3.5–5.1)
Sodium: 134 mEq/L — ABNORMAL LOW (ref 135–145)

## 2011-11-02 LAB — TISSUE CULTURE: Culture: NO GROWTH

## 2011-11-02 LAB — CBC
Platelets: 246 10*3/uL (ref 150–400)
RDW: 16.1 % — ABNORMAL HIGH (ref 11.5–15.5)
WBC: 10.7 10*3/uL — ABNORMAL HIGH (ref 4.0–10.5)

## 2011-11-02 LAB — URINE CULTURE
Colony Count: NO GROWTH
Culture: NO GROWTH

## 2011-11-02 MED ORDER — OXYCODONE-ACETAMINOPHEN 5-325 MG PO TABS: 1.0000 | ORAL_TABLET | ORAL | Status: AC | PRN

## 2011-11-02 MED ORDER — ENOXAPARIN SODIUM 30 MG/0.3ML ~~LOC~~ SOLN: 30.0000 mg | Freq: Two times a day (BID) | SUBCUTANEOUS | Status: DC

## 2011-11-02 MED ORDER — DSS 100 MG PO CAPS: 100.0000 mg | ORAL_CAPSULE | Freq: Two times a day (BID) | ORAL | Status: AC

## 2011-11-02 MED ORDER — SODIUM CHLORIDE 0.9 % IV BOLUS (SEPSIS): 500.0000 mL | Freq: Once | INTRAVENOUS | Status: DC

## 2011-11-02 MED ORDER — CIPROFLOXACIN HCL 500 MG PO TABS: 500.0000 mg | ORAL_TABLET | Freq: Two times a day (BID) | ORAL | Status: AC

## 2011-11-02 MED ORDER — CIPROFLOXACIN HCL 500 MG PO TABS
500.0000 mg | ORAL_TABLET | Freq: Two times a day (BID) | ORAL | Status: DC
  Administered 2011-11-02: 500 mg via ORAL
  Filled 2011-11-02 (×3): qty 1

## 2011-11-02 NOTE — Progress Notes (Signed)
Physical Therapy Treatment Patient Details Name: Mia Contreras MRN: [id number] DOB: [birth year] Today's Date: 11/02/2011  PT Assessment/Plan  PT - Assessment/Plan Comments on Treatment Session: Pt admitted s/p left TKA and has much improved tolerance to activity today.  Pt able to increase distance ambulated as well as trial seated exercises. PT Plan: Discharge plan remains appropriate;Frequency remains appropriate PT Frequency: 7X/week Follow Up Recommendations: Skilled nursing facility Equipment Recommended: Defer to next venue PT Goals  Acute Rehab PT Goals PT Goal Formulation: With patient Time For Goal Achievement: 7 days PT Goal: Sit to Stand - Progress: Met PT Goal: Stand to Sit - Progress: Progressing toward goal PT Goal: Ambulate - Progress: Progressing toward goal PT Goal: Perform Home Exercise Program - Progress: Progressing toward goal  PT Treatment Precautions/Restrictions  Precautions Precautions: Knee Required Braces or Orthoses: Yes Knee Immobilizer: On when out of bed or walking;Discontinue post op day 2 Restrictions Weight Bearing Restrictions: Yes LLE Weight Bearing: Weight bearing as tolerated Mobility (including Balance) Bed Mobility Bed Mobility: No Supine to Sit: Not tested (comment) Sitting - Scoot to Edge of Bed: Not tested (comment) Sit to Supine: Not Tested (comment) Transfers Transfers: Yes Sit to Stand: 5: Supervision;With upper extremity assist;From chair/3-in-1 (2 trials.) Sit to Stand Details (indicate cue type and reason): Verbal cues for safest hand placement. Stand to Sit: 4: Min assist;With upper extremity assist;To chair/3-in-1 (2 trials.) Stand to Sit Details: Assist to slow descent with cues for hand/left LE placement. Ambulation/Gait Ambulation/Gait: Yes Ambulation/Gait Assistance: 4: Min assist (Min (guard)) Ambulation/Gait Assistance Details (indicate cue type and reason): Guarding for balance with cues for heel-toe  sequence. Ambulation Distance (Feet): 80 Feet (40 feet x 2 trials.) Assistive device: Rolling walker Gait Pattern: Step-to pattern Stairs: No Wheelchair Mobility Wheelchair Mobility: No  Posture/Postural Control Posture/Postural Control: No significant limitations Balance Balance Assessed: No Exercise  Total Joint Exercises Long Arc Quad: AROM;Left;10 reps;Seated Knee Flexion: AROM;Left;10 reps;Seated End of Session PT - End of Session Equipment Utilized During Treatment: Gait belt Activity Tolerance: Patient tolerated treatment well Patient left: in chair;with call bell in reach Nurse Communication: Mobility status for transfers;Mobility status for ambulation General Behavior During Session: [facility] for tasks performed Cognition: [facility] for tasks performed  Cyndia Bent 11/02/2011, 9:25 AM  11/02/2011 Cyndia Bent, PT, DPT [phone]

## 2011-11-02 NOTE — Progress Notes (Signed)
Clinical Social Work-CSW assembled d/c packet with fl2 and AVS-pt brother to provide transport to [facility] further [place] [facility], [phone]

## 2011-11-02 NOTE — Discharge Summary (Signed)
Physician Discharge Summary  Patient ID: Mia Contreras MRN: [id number] DOB/AGE: 1928/12/12 78 y.o.  Admit date: 10/30/2011 Discharge date: 11/02/2011  Admission Diagnoses: Past Medical History  Diagnosis Date  . GOITER, MULTINODULAR 05/06/2010    Dr [name]  . HYPERTHYROIDISM 05/23/2010  . HYPERLIPIDEMIA 08/20/2007  . ANEMIA-NOS 08/20/2007  . ANXIETY 03/16/2007  . CARPAL TUNNEL SYNDROME, RIGHT 01/11/2008  . HYPERTENSION 03/16/2007  . VENOUS INSUFFICIENCY 09/05/2007  . DIVERTICULOSIS, COLON 08/20/2007  . CELLULITIS, LEG, RIGHT 08/20/2007  . OSTEOARTHRITIS 03/16/2007  . OSTEOPOROSIS 08/20/2007  . SCOLIOSIS 01/11/2008  . BREAST CANCER, HX OF 03/16/2007  . SKIN CANCER, HX OF 11/12/2008     R cheek 2010, L Cheek 2011 Dr. Tonia Brooms  . PREMATURE ATRIAL CONTRACTIONS 09/21/2010  . Thyroid nodule   . Cataract     floaters  . Cancer     left breaast  . Pneumonia 07/2011    took abx for several weeks   Discharge Diagnoses:  Principal Problem:  *OSTEOARTHRITIS Active Problems:  ANXIETY  HYPERTENSION  PREMATURE ATRIAL CONTRACTIONS  Arrhythmia  COPD (chronic obstructive pulmonary disease)  Postoperative anemia due to acute blood loss  UTI (urinary tract infection)   Discharged Condition: good  Hospital Course: 10/30/11 patient underwent a left total knee replacement by Dr. Noemi Chapel. She also underwent a femoral nerve block by anesthesia. She tolerated both procedures well. In the recovery room she was placed in a CPM 0-90. She tolerated this well postop day 1 patient had limited ambulation. With just moving bed to chair and still using a bedpan. Her Foley was discontinued postop day 1. Postop day 2 patient noticed that she was having difficulty urinating. Use a UA was run that showed many bacteria. Postop day 3 patient was started on Cipro. Patient has had acute postoperative blood loss anemia. Her hemoglobin on discharge is 8.1. She is not tachycardic and she is not symptomatic. Her her Losartan  is being held due to some hypotension. She is minimal assist getting out of bed to ambulation. She has some diminished renal function we think it is due to dehydration. This is another reason to hold her losartan. Her BUN is 46 her creatinine is 1.2 I would recommend following that since then. I would recommend encouraging by mouth fluids.  She has COPD with some expiratory wheezes. I recommend that you follow closely and give her her Ventolin/parietal air or when needed. She is being discharged in stable condition weightbearing as tolerated on a regular diet to skilled nursing she will need daily physical therapy she will need occupational therapy. When she is not walking and when she is not no CPM she needs to have a yellow block under her left heel working on extension. She needs daily dressing changes. Please white 1 with alcohol or chlorhexidine. Apply a dry dressing and maintain her TED hose. She is to followup with Dr. Noemi Chapel 2 weeks postoperatively followup is appointment time is further down this note  Consults: None  Significant Diagnostic Studies:  Results for orders placed during the hospital encounter of 10/30/11 (from the past 72 hour(s))  CBC     Status: Abnormal   Collection Time   10/31/11  5:55 AM      Component Value Range Comment   WBC 10.9 (*) 4.0 - 10.5 (K/uL)    RBC 3.11 (*) 3.87 - 5.11 (MIL/uL)    Hemoglobin 9.4 (*) 12.0 - 15.0 (g/dL)    HCT 28.7 (*) 36.0 - 46.0 (%)    MCV  92.3  78.0 - 100.0 (fL)    MCH 30.2  26.0 - 34.0 (pg)    MCHC 32.8  30.0 - 36.0 (g/dL)    RDW 16.0 (*) 11.5 - 15.5 (%)    Platelets 248  150 - 400 (K/uL)   BASIC METABOLIC PANEL     Status: Abnormal   Collection Time   10/31/11  5:55 AM      Component Value Range Comment   Sodium 139  135 - 145 (mEq/L)    Potassium 3.9  3.5 - 5.1 (mEq/L)    Chloride 105  96 - 112 (mEq/L)    CO2 28  19 - 32 (mEq/L)    Glucose, Bld 121 (*) 70 - 99 (mg/dL)    BUN 11  6 - 23 (mg/dL)    Creatinine, Ser 0.86  0.50 -  1.10 (mg/dL)    Calcium 8.1 (*) 8.4 - 10.5 (mg/dL)    GFR calc non Af Amer 62 (*) >90 (mL/min)    GFR calc Af Amer 72 (*) >90 (mL/min)   CBC     Status: Abnormal   Collection Time   11/01/11  6:55 AM      Component Value Range Comment   WBC 13.4 (*) 4.0 - 10.5 (K/uL)    RBC 2.79 (*) 3.87 - 5.11 (MIL/uL)    Hemoglobin 8.5 (*) 12.0 - 15.0 (g/dL)    HCT 26.3 (*) 36.0 - 46.0 (%)    MCV 94.3  78.0 - 100.0 (fL)    MCH 30.5  26.0 - 34.0 (pg)    MCHC 32.3  30.0 - 36.0 (g/dL)    RDW 16.2 (*) 11.5 - 15.5 (%)    Platelets 255  150 - 400 (K/uL)   BASIC METABOLIC PANEL     Status: Abnormal   Collection Time   11/01/11  6:55 AM      Component Value Range Comment   Sodium 138  135 - 145 (mEq/L)    Potassium 4.0  3.5 - 5.1 (mEq/L)    Chloride 103  96 - 112 (mEq/L)    CO2 27  19 - 32 (mEq/L)    Glucose, Bld 143 (*) 70 - 99 (mg/dL)    BUN 22  6 - 23 (mg/dL)    Creatinine, Ser 1.18 (*) 0.50 - 1.10 (mg/dL)    Calcium 8.8  8.4 - 10.5 (mg/dL)    GFR calc non Af Amer 42 (*) >90 (mL/min)    GFR calc Af Amer 49 (*) >90 (mL/min)   URINALYSIS, ROUTINE W REFLEX MICROSCOPIC     Status: Abnormal   Collection Time   11/01/11  6:14 PM      Component Value Range Comment   Color, Urine AMBER (*) YELLOW  BIOCHEMICALS MAY BE AFFECTED BY COLOR   APPearance CLOUDY (*) CLEAR     Specific Gravity, Urine 1.022  1.005 - 1.030     pH 5.5  5.0 - 8.0     Glucose, UA NEGATIVE  NEGATIVE (mg/dL)    Hgb urine dipstick NEGATIVE  NEGATIVE     Bilirubin Urine NEGATIVE  NEGATIVE     Ketones, ur NEGATIVE  NEGATIVE (mg/dL)    Protein, ur NEGATIVE  NEGATIVE (mg/dL)    Urobilinogen, UA 0.2  0.0 - 1.0 (mg/dL)    Nitrite NEGATIVE  NEGATIVE     Leukocytes, UA MODERATE (*) NEGATIVE    URINE MICROSCOPIC-ADD ON     Status: Abnormal   Collection Time   11/01/11  6:14 PM      Component Value Range Comment   Squamous Epithelial / LPF MANY (*) RARE     WBC, UA 21-50  <3 (WBC/hpf)    RBC / HPF 0-2  <3 (RBC/hpf)    Bacteria, UA MANY  (*) RARE     Casts HYALINE CASTS (*) NEGATIVE     Urine-Other MUCOUS PRESENT     CBC     Status: Abnormal   Collection Time   11/02/11  5:38 AM      Component Value Range Comment   WBC 10.7 (*) 4.0 - 10.5 (K/uL)    RBC 2.62 (*) 3.87 - 5.11 (MIL/uL)    Hemoglobin 8.1 (*) 12.0 - 15.0 (g/dL)    HCT 24.6 (*) 36.0 - 46.0 (%)    MCV 93.9  78.0 - 100.0 (fL)    MCH 30.9  26.0 - 34.0 (pg)    MCHC 32.9  30.0 - 36.0 (g/dL)    RDW 16.1 (*) 11.5 - 15.5 (%)    Platelets 246  150 - 400 (K/uL)   BASIC METABOLIC PANEL     Status: Abnormal   Collection Time   11/02/11  5:38 AM      Component Value Range Comment   Sodium 134 (*) 135 - 145 (mEq/L)    Potassium 4.6  3.5 - 5.1 (mEq/L)    Chloride 102  96 - 112 (mEq/L)    CO2 27  19 - 32 (mEq/L)    Glucose, Bld 125 (*) 70 - 99 (mg/dL)    BUN 28 (*) 6 - 23 (mg/dL)    Creatinine, Ser 1.20 (*) 0.50 - 1.10 (mg/dL)    Calcium 9.0  8.4 - 10.5 (mg/dL)    GFR calc non Af Amer 42 (*) >90 (mL/min)    GFR calc Af Amer 48 (*) >90 (mL/min)     Treatments: IV hydration, antibiotics: vancomycin, analgesia: percocet, therapies: PT, OT, RN and SW and surgery: left total knee  Discharge Exam: Blood pressure 123/54, pulse 80, temperature 99 F (37.2 C), temperature source Oral, resp. rate 18, height 5\' 1"  (1.549 m), weight 61.689 kg (136 lb), SpO2 98.00%. General appearance: alert, cooperative and appears stated age Resp: wheezes bilaterally Extremities: extremities normal, atraumatic, no cyanosis or edema Pulses: 2+ and symmetric Neurologic: Grossly normal Incision/Wound: well approximated.  Slightly red, moderate ecchymosis, scant drainage  Disposition:   Discharge Orders    Future Appointments: Provider: Department: Dept Phone: Center:   12/12/2011 11:20 AM Haywood Lasso, MD [facility] [phone] None   02/25/2012 1:15 PM Cassandria Anger, MD [facility] [phone] [facility]   01/17/2012 11:15 AM Josue Hector, MD [place] [phone]  [email or web address]   08/07/2012 10:00 AM Renato Shin, MD [facility] [phone] [facility]     Future Orders Please Complete By Expires   Diet - low sodium heart healthy      Call MD / Call 911      Comments:   If you experience chest pain or shortness of breath, CALL 911 and be transported to the hospital emergency room.  If you develope a fever above 101 F, pus (white drainage) or increased drainage or redness at the wound, or calf pain, call your surgeon's office.   Constipation Prevention      Comments:   Drink plenty of fluids.  Prune juice may be helpful.  You may use a stool softener, such as Colace (over the counter) 100 mg twice a day.  Use MiraLax (over  the counter) for constipation as needed.   Increase activity slowly as tolerated      Weight Bearing as taught in Physical Therapy      Comments:   Use a walker or crutches as instructed.   Discharge instructions      Comments:   Total Knee Replacement Care After Refer to this sheet in the next few weeks. These discharge instructions provide you with general information on caring for yourself after you leave the hospital. Your caregiver may also give you specific instructions. Your treatment has been planned according to the most current medical practices available, but unavoidable complications sometimes occur. If you have any problems or questions after discharge, please call your caregiver. Regaining a near full range of motion of your knee within the first 3 to 6 weeks after surgery is critical. [place] may resume a normal diet and activities as directed. Perform exercises as directed.  You will receive physical therapy as directed by your caregiver.  Take showers instead of baths until informed otherwise.  Change bandages (dressings) if necessary or as directed.  Only take over-the-counter or prescription medicines for pain, discomfort, or fever as directed by your caregiver.  Eat a well-balanced diet.  Avoid lifting  or driving until you are instructed otherwise.  Make an appointment to see your caregiver for stitches (suture) or staple removal as directed.  If you have been sent home with a continuous passive motion machine (CPM machine), use as directed.  SEEK MEDICAL CARE IF: You have swelling of your calf or leg.  You develop shortness of breath or chest pain.  You have redness, swelling, or increasing pain in the wound.  There is pus or any unusual drainage coming from the surgical site.  You notice a bad smell coming from the surgical site or dressing.  The surgical site breaks open after sutures or staples have been removed.  There is persistent bleeding from the suture or staple line.  You are getting worse or are not improving.  You have any other questions or concerns.  SEEK IMMEDIATE MEDICAL CARE IF:  You have a fever.  You develop a rash.  You have difficulty breathing.  You develop any reaction or side effects to medicines given.  Your knee motion is decreasing rather than improving.  MAKE SURE YOU:  Understand these instructions.  Will watch your condition.  Will get help right away if you are not doing well or get worse.  Document Released: 02/24/2005 Document Revised: 04/19/2011 Document Reviewed: 06/09/2009 [facility] Patient Information 2012 [place].   CPM      Comments:   Continuous passive motion machine (CPM):      Use the CPM from 0 to 90 for  6 hours per day.      You may break it up into 2 or 3 sessions per day.      Use CPM for 2 weeks or until you are told to stop.   TED hose      Comments:   Use stockings (TED hose) for 2 weeks on both leg(s).  You may remove them at night for sleeping.   Change dressing      Comments:   Change the dressing daily with sterile 4 x 4 inch gauze dressing and apply TED hose.  You may clean the incision with alcohol prior to redressing.   Do not put a pillow under the knee. Place it under the heel.  Comments:   Place yellow  foam block, yellow side up under heel at all times except when in CPM or when walking.  DO NOT modify, tear, cut, or change in any way the yellow foam block.     Medication List  As of 11/02/2011  8:18 AM   STOP taking these medications         APAP ARTHRITIS 650 MG CR tablet      aspirin 325 MG tablet      losartan 100 MG tablet         TAKE these medications         ALPRAZolam 0.25 MG tablet   Commonly known as: XANAX   Take 0.25-0.5 mg by mouth 3 (three) times daily as needed. For anxiety      Calcium 600+D 600-400 MG-UNIT per tablet   Generic drug: Calcium Carbonate-Vitamin D   Take 1 tablet by mouth daily.      carvedilol 12.5 MG tablet   Commonly known as: COREG   Take 1 tablet (12.5 mg total) by mouth 2 (two) times daily.      ciprofloxacin 500 MG tablet   Commonly known as: CIPRO   Take 1 tablet (500 mg total) by mouth 2 (two) times daily.      DSS 100 MG Caps   Take 100 mg by mouth 2 (two) times daily.      enoxaparin 30 MG/0.3ML Soln   Commonly known as: LOVENOX   Inject 0.3 mLs (30 mg total) into the skin every 12 (twelve) hours.      fluticasone 50 MCG/ACT nasal spray   Commonly known as: FLONASE   Place 2 sprays into the nose daily.      Fluticasone-Salmeterol 250-50 MCG/DOSE Aepb   Commonly known as: ADVAIR   Inhale 1 puff into the lungs 2 (two) times daily.      oxyCODONE-acetaminophen 5-325 MG per tablet   Commonly known as: PERCOCET   Take 1-2 tablets by mouth every 4 (four) hours as needed.      vitamin C 500 MG tablet   Commonly known as: ASCORBIC ACID   Take 500 mg by mouth daily as needed. If getting a cold        vitamin E 400 UNIT capsule   Take 400 Units by mouth daily.           Follow-up Information    Follow up with Lorn Junes, MD on 11/13/2011. (appt time 2:15)    Contact information:   [facility] [address], [place] [phone]           Signed: Linda Hedges 11/02/2011, 8:18 AM

## 2011-11-02 NOTE — Progress Notes (Signed)
Pt being D/Cd to SNF. Brother taking pt to nursing home.

## 2011-11-04 LAB — ANAEROBIC CULTURE: Gram Stain: NONE SEEN

## 2011-11-07 DIAGNOSIS — M1990 Unspecified osteoarthritis, unspecified site: Secondary | ICD-10-CM | POA: Diagnosis not present

## 2011-11-07 DIAGNOSIS — I10 Essential (primary) hypertension: Secondary | ICD-10-CM | POA: Diagnosis not present

## 2011-11-07 DIAGNOSIS — N390 Urinary tract infection, site not specified: Secondary | ICD-10-CM | POA: Diagnosis not present

## 2011-11-07 DIAGNOSIS — J449 Chronic obstructive pulmonary disease, unspecified: Secondary | ICD-10-CM | POA: Diagnosis not present

## 2011-11-08 DIAGNOSIS — F411 Generalized anxiety disorder: Secondary | ICD-10-CM | POA: Diagnosis not present

## 2011-11-08 DIAGNOSIS — D62 Acute posthemorrhagic anemia: Secondary | ICD-10-CM | POA: Diagnosis not present

## 2011-11-08 DIAGNOSIS — I10 Essential (primary) hypertension: Secondary | ICD-10-CM | POA: Diagnosis not present

## 2011-11-08 DIAGNOSIS — J449 Chronic obstructive pulmonary disease, unspecified: Secondary | ICD-10-CM | POA: Diagnosis not present

## 2011-11-13 DIAGNOSIS — M1990 Unspecified osteoarthritis, unspecified site: Secondary | ICD-10-CM | POA: Diagnosis not present

## 2011-11-13 DIAGNOSIS — J449 Chronic obstructive pulmonary disease, unspecified: Secondary | ICD-10-CM | POA: Diagnosis not present

## 2011-11-13 DIAGNOSIS — D62 Acute posthemorrhagic anemia: Secondary | ICD-10-CM | POA: Diagnosis not present

## 2011-11-13 DIAGNOSIS — I10 Essential (primary) hypertension: Secondary | ICD-10-CM | POA: Diagnosis not present

## 2011-11-15 DIAGNOSIS — M1710 Unilateral primary osteoarthritis, unspecified knee: Secondary | ICD-10-CM | POA: Diagnosis not present

## 2011-11-15 DIAGNOSIS — Z96659 Presence of unspecified artificial knee joint: Secondary | ICD-10-CM | POA: Diagnosis not present

## 2011-11-20 ENCOUNTER — Other Ambulatory Visit: Payer: Self-pay | Admitting: Cardiology

## 2011-11-20 ENCOUNTER — Encounter (INDEPENDENT_AMBULATORY_CARE_PROVIDER_SITE_OTHER): Payer: Medicare Other

## 2011-11-20 DIAGNOSIS — R609 Edema, unspecified: Secondary | ICD-10-CM

## 2011-11-20 DIAGNOSIS — R52 Pain, unspecified: Secondary | ICD-10-CM

## 2011-11-20 DIAGNOSIS — M79609 Pain in unspecified limb: Secondary | ICD-10-CM | POA: Diagnosis not present

## 2011-11-20 DIAGNOSIS — M7989 Other specified soft tissue disorders: Secondary | ICD-10-CM | POA: Diagnosis not present

## 2011-11-21 DIAGNOSIS — M1710 Unilateral primary osteoarthritis, unspecified knee: Secondary | ICD-10-CM | POA: Diagnosis not present

## 2011-11-21 DIAGNOSIS — Z96659 Presence of unspecified artificial knee joint: Secondary | ICD-10-CM | POA: Diagnosis not present

## 2011-11-21 DIAGNOSIS — M25569 Pain in unspecified knee: Secondary | ICD-10-CM | POA: Diagnosis not present

## 2011-11-21 DIAGNOSIS — M2540 Effusion, unspecified joint: Secondary | ICD-10-CM | POA: Diagnosis not present

## 2011-11-23 DIAGNOSIS — M1710 Unilateral primary osteoarthritis, unspecified knee: Secondary | ICD-10-CM | POA: Diagnosis not present

## 2011-11-23 DIAGNOSIS — Z96659 Presence of unspecified artificial knee joint: Secondary | ICD-10-CM | POA: Diagnosis not present

## 2011-11-23 DIAGNOSIS — M25569 Pain in unspecified knee: Secondary | ICD-10-CM | POA: Diagnosis not present

## 2011-11-23 DIAGNOSIS — M2540 Effusion, unspecified joint: Secondary | ICD-10-CM | POA: Diagnosis not present

## 2011-11-28 DIAGNOSIS — M1710 Unilateral primary osteoarthritis, unspecified knee: Secondary | ICD-10-CM | POA: Diagnosis not present

## 2011-11-28 DIAGNOSIS — M7989 Other specified soft tissue disorders: Secondary | ICD-10-CM | POA: Diagnosis not present

## 2011-11-28 DIAGNOSIS — M25569 Pain in unspecified knee: Secondary | ICD-10-CM | POA: Diagnosis not present

## 2011-11-28 DIAGNOSIS — Z96659 Presence of unspecified artificial knee joint: Secondary | ICD-10-CM | POA: Diagnosis not present

## 2011-11-30 DIAGNOSIS — M1710 Unilateral primary osteoarthritis, unspecified knee: Secondary | ICD-10-CM | POA: Diagnosis not present

## 2011-11-30 DIAGNOSIS — M2560 Stiffness of unspecified joint, not elsewhere classified: Secondary | ICD-10-CM | POA: Diagnosis not present

## 2011-11-30 DIAGNOSIS — Z96659 Presence of unspecified artificial knee joint: Secondary | ICD-10-CM | POA: Diagnosis not present

## 2011-11-30 DIAGNOSIS — M25569 Pain in unspecified knee: Secondary | ICD-10-CM | POA: Diagnosis not present

## 2011-12-05 DIAGNOSIS — M25569 Pain in unspecified knee: Secondary | ICD-10-CM | POA: Diagnosis not present

## 2011-12-05 DIAGNOSIS — M2560 Stiffness of unspecified joint, not elsewhere classified: Secondary | ICD-10-CM | POA: Diagnosis not present

## 2011-12-05 DIAGNOSIS — M1710 Unilateral primary osteoarthritis, unspecified knee: Secondary | ICD-10-CM | POA: Diagnosis not present

## 2011-12-05 DIAGNOSIS — Z96659 Presence of unspecified artificial knee joint: Secondary | ICD-10-CM | POA: Diagnosis not present

## 2011-12-08 DIAGNOSIS — M1710 Unilateral primary osteoarthritis, unspecified knee: Secondary | ICD-10-CM | POA: Diagnosis not present

## 2011-12-08 DIAGNOSIS — M2540 Effusion, unspecified joint: Secondary | ICD-10-CM | POA: Diagnosis not present

## 2011-12-08 DIAGNOSIS — H26499 Other secondary cataract, unspecified eye: Secondary | ICD-10-CM | POA: Diagnosis not present

## 2011-12-08 DIAGNOSIS — Z96659 Presence of unspecified artificial knee joint: Secondary | ICD-10-CM | POA: Diagnosis not present

## 2011-12-08 DIAGNOSIS — M25569 Pain in unspecified knee: Secondary | ICD-10-CM | POA: Diagnosis not present

## 2011-12-12 ENCOUNTER — Ambulatory Visit (INDEPENDENT_AMBULATORY_CARE_PROVIDER_SITE_OTHER): Payer: Self-pay | Admitting: Surgery

## 2011-12-12 DIAGNOSIS — M2560 Stiffness of unspecified joint, not elsewhere classified: Secondary | ICD-10-CM | POA: Diagnosis not present

## 2011-12-12 DIAGNOSIS — Z96659 Presence of unspecified artificial knee joint: Secondary | ICD-10-CM | POA: Diagnosis not present

## 2011-12-12 DIAGNOSIS — M25569 Pain in unspecified knee: Secondary | ICD-10-CM | POA: Diagnosis not present

## 2011-12-12 DIAGNOSIS — M1710 Unilateral primary osteoarthritis, unspecified knee: Secondary | ICD-10-CM | POA: Diagnosis not present

## 2011-12-13 ENCOUNTER — Encounter (INDEPENDENT_AMBULATORY_CARE_PROVIDER_SITE_OTHER): Payer: Self-pay | Admitting: General Surgery

## 2011-12-14 DIAGNOSIS — M2560 Stiffness of unspecified joint, not elsewhere classified: Secondary | ICD-10-CM | POA: Diagnosis not present

## 2011-12-14 DIAGNOSIS — M25569 Pain in unspecified knee: Secondary | ICD-10-CM | POA: Diagnosis not present

## 2011-12-14 DIAGNOSIS — Z96659 Presence of unspecified artificial knee joint: Secondary | ICD-10-CM | POA: Diagnosis not present

## 2011-12-14 DIAGNOSIS — M1710 Unilateral primary osteoarthritis, unspecified knee: Secondary | ICD-10-CM | POA: Diagnosis not present

## 2011-12-15 ENCOUNTER — Encounter (INDEPENDENT_AMBULATORY_CARE_PROVIDER_SITE_OTHER): Payer: Self-pay | Admitting: Surgery

## 2011-12-15 ENCOUNTER — Ambulatory Visit (INDEPENDENT_AMBULATORY_CARE_PROVIDER_SITE_OTHER): Payer: Medicare Other | Admitting: Surgery

## 2011-12-15 VITALS — BP 146/78 | HR 70 | Temp 97.4°F | Resp 16 | Ht 63.5 in | Wt 128.5 lb

## 2011-12-15 DIAGNOSIS — Z853 Personal history of malignant neoplasm of breast: Secondary | ICD-10-CM | POA: Diagnosis not present

## 2011-12-15 NOTE — Patient Instructions (Signed)
We will see you again on an as needed basis. Please call the office at [phone] if you have any questions or concerns. Thank you for allowing us to take care of you.  

## 2011-12-15 NOTE — Progress Notes (Signed)
NAME: [name]       DOB: Apr 03, 1929           DATE: 12/15/2011       MRN: [id number]   Mia Contreras Lia is a 78 y.o.Marland Kitchenfemale who presents for routine followup of her Stage II receptor negative IDC left breast cancer diagnosed in 2000 and treated with lumpectomy and SLN. She has no problems or concerns on either side.  PFSH: She has had no significant changes since the last visit here.  ROS: There have been no significant changes since the last visit here  EXAM: General: The patient is alert, oriented, generally healty appearing, NAD. Mood and affect are normal.  Breasts:  Breasts are unremarkable and there is no evidence of recurrence or new mass.  Lymphatics: She has no axillary or supraclavicular adenopathy on either side.  Extremities: Full ROM of the surgical side with no lymphedema noted.  Data Reviewed: Mammogram last year was negative, due again in May  Impression: Doing well, with no evidence of recurrent cancer or new cancer  Plan: .She is 13 years out and would like to stop f/u here but will continue annual mammograms.

## 2011-12-19 DIAGNOSIS — Z96659 Presence of unspecified artificial knee joint: Secondary | ICD-10-CM | POA: Diagnosis not present

## 2011-12-19 DIAGNOSIS — M25569 Pain in unspecified knee: Secondary | ICD-10-CM | POA: Diagnosis not present

## 2011-12-19 DIAGNOSIS — M1710 Unilateral primary osteoarthritis, unspecified knee: Secondary | ICD-10-CM | POA: Diagnosis not present

## 2011-12-19 DIAGNOSIS — M2560 Stiffness of unspecified joint, not elsewhere classified: Secondary | ICD-10-CM | POA: Diagnosis not present

## 2011-12-21 DIAGNOSIS — M25569 Pain in unspecified knee: Secondary | ICD-10-CM | POA: Diagnosis not present

## 2011-12-21 DIAGNOSIS — M2560 Stiffness of unspecified joint, not elsewhere classified: Secondary | ICD-10-CM | POA: Diagnosis not present

## 2011-12-21 DIAGNOSIS — Z96659 Presence of unspecified artificial knee joint: Secondary | ICD-10-CM | POA: Diagnosis not present

## 2011-12-21 DIAGNOSIS — M1710 Unilateral primary osteoarthritis, unspecified knee: Secondary | ICD-10-CM | POA: Diagnosis not present

## 2011-12-26 DIAGNOSIS — M2560 Stiffness of unspecified joint, not elsewhere classified: Secondary | ICD-10-CM | POA: Diagnosis not present

## 2011-12-26 DIAGNOSIS — M1710 Unilateral primary osteoarthritis, unspecified knee: Secondary | ICD-10-CM | POA: Diagnosis not present

## 2011-12-26 DIAGNOSIS — M25569 Pain in unspecified knee: Secondary | ICD-10-CM | POA: Diagnosis not present

## 2011-12-26 DIAGNOSIS — Z96659 Presence of unspecified artificial knee joint: Secondary | ICD-10-CM | POA: Diagnosis not present

## 2011-12-28 DIAGNOSIS — M2560 Stiffness of unspecified joint, not elsewhere classified: Secondary | ICD-10-CM | POA: Diagnosis not present

## 2011-12-28 DIAGNOSIS — M1710 Unilateral primary osteoarthritis, unspecified knee: Secondary | ICD-10-CM | POA: Diagnosis not present

## 2011-12-28 DIAGNOSIS — M25569 Pain in unspecified knee: Secondary | ICD-10-CM | POA: Diagnosis not present

## 2011-12-28 DIAGNOSIS — Z96659 Presence of unspecified artificial knee joint: Secondary | ICD-10-CM | POA: Diagnosis not present

## 2012-01-01 DIAGNOSIS — M1710 Unilateral primary osteoarthritis, unspecified knee: Secondary | ICD-10-CM | POA: Diagnosis not present

## 2012-01-01 DIAGNOSIS — Z96659 Presence of unspecified artificial knee joint: Secondary | ICD-10-CM | POA: Diagnosis not present

## 2012-01-01 DIAGNOSIS — M2560 Stiffness of unspecified joint, not elsewhere classified: Secondary | ICD-10-CM | POA: Diagnosis not present

## 2012-01-01 DIAGNOSIS — M25569 Pain in unspecified knee: Secondary | ICD-10-CM | POA: Diagnosis not present

## 2012-01-03 ENCOUNTER — Encounter: Payer: Self-pay | Admitting: Internal Medicine

## 2012-01-03 ENCOUNTER — Other Ambulatory Visit (INDEPENDENT_AMBULATORY_CARE_PROVIDER_SITE_OTHER): Payer: Medicare Other

## 2012-01-03 ENCOUNTER — Ambulatory Visit (INDEPENDENT_AMBULATORY_CARE_PROVIDER_SITE_OTHER): Payer: Medicare Other | Admitting: Internal Medicine

## 2012-01-03 VITALS — BP 178/90 | HR 80 | Temp 98.2°F | Resp 16 | Wt 131.0 lb

## 2012-01-03 DIAGNOSIS — R609 Edema, unspecified: Secondary | ICD-10-CM

## 2012-01-03 DIAGNOSIS — D649 Anemia, unspecified: Secondary | ICD-10-CM | POA: Diagnosis not present

## 2012-01-03 DIAGNOSIS — I10 Essential (primary) hypertension: Secondary | ICD-10-CM | POA: Diagnosis not present

## 2012-01-03 DIAGNOSIS — F411 Generalized anxiety disorder: Secondary | ICD-10-CM

## 2012-01-03 LAB — IBC PANEL
Iron: 56 ug/dL (ref 42–145)
Saturation Ratios: 12.8 % — ABNORMAL LOW (ref 20.0–50.0)

## 2012-01-03 LAB — CBC WITH DIFFERENTIAL/PLATELET
Basophils Absolute: 0.1 10*3/uL (ref 0.0–0.1)
Eosinophils Relative: 2.4 % (ref 0.0–5.0)
HCT: 39.3 % (ref 36.0–46.0)
Lymphs Abs: 2 10*3/uL (ref 0.7–4.0)
Monocytes Absolute: 0.6 10*3/uL (ref 0.1–1.0)
Monocytes Relative: 7.6 % (ref 3.0–12.0)
Neutrophils Relative %: 62 % (ref 43.0–77.0)
Platelets: 293 10*3/uL (ref 150.0–400.0)
RDW: 16.4 % — ABNORMAL HIGH (ref 11.5–14.6)
WBC: 7.4 10*3/uL (ref 4.5–10.5)

## 2012-01-03 LAB — BASIC METABOLIC PANEL
Calcium: 9.5 mg/dL (ref 8.4–10.5)
GFR: 62.35 mL/min (ref >60.00)
Glucose, Bld: 97 mg/dL (ref 70–99)
Potassium: 4.1 mEq/L (ref 3.5–5.1)
Sodium: 140 mEq/L (ref 135–145)

## 2012-01-03 LAB — TSH: TSH: 1.81 u[IU]/mL (ref 0.35–5.50)

## 2012-01-03 MED ORDER — ALPRAZOLAM 0.25 MG PO TABS: 0.2500 mg | ORAL_TABLET | Freq: Three times a day (TID) | ORAL | Status: DC | PRN

## 2012-01-03 NOTE — Assessment & Plan Note (Signed)
Chronic and controlled BP at home has been WNL
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
## 2012-01-03 NOTE — Assessment & Plan Note (Signed)
Doing well 

## 2012-01-03 NOTE — Assessment & Plan Note (Signed)
3/13 post-op Labs

## 2012-01-03 NOTE — Assessment & Plan Note (Signed)
Continue with current prescription therapy as reflected on the Med list.  

## 2012-01-04 DIAGNOSIS — M1710 Unilateral primary osteoarthritis, unspecified knee: Secondary | ICD-10-CM | POA: Diagnosis not present

## 2012-01-04 DIAGNOSIS — Z96659 Presence of unspecified artificial knee joint: Secondary | ICD-10-CM | POA: Diagnosis not present

## 2012-01-04 DIAGNOSIS — M25569 Pain in unspecified knee: Secondary | ICD-10-CM | POA: Diagnosis not present

## 2012-01-04 DIAGNOSIS — M2560 Stiffness of unspecified joint, not elsewhere classified: Secondary | ICD-10-CM | POA: Diagnosis not present

## 2012-01-10 ENCOUNTER — Telehealth: Payer: Self-pay | Admitting: Internal Medicine

## 2012-01-10 NOTE — Telephone Encounter (Signed)
Labs are good - no problems Thx

## 2012-01-10 NOTE — Telephone Encounter (Signed)
The pt called and is hoping to get her lab work results.  It was explained to her that the office would call or send a letter, however, she wants to speak directly with someone to discuss results.

## 2012-01-10 NOTE — Telephone Encounter (Signed)
I advised pt of recent lab results. She wants to know if she can d/c ferrous sulfate. Please advise.

## 2012-01-11 NOTE — Telephone Encounter (Signed)
Yes, in 1 month Thx

## 2012-01-11 NOTE — Telephone Encounter (Signed)
Can she d/c Ferrous sulfate?

## 2012-01-12 NOTE — Telephone Encounter (Signed)
LMOVM to call back 

## 2012-01-16 NOTE — Telephone Encounter (Signed)
Pt advised.

## 2012-01-17 ENCOUNTER — Ambulatory Visit (INDEPENDENT_AMBULATORY_CARE_PROVIDER_SITE_OTHER): Payer: Medicare Other | Admitting: Cardiovascular Disease

## 2012-01-17 ENCOUNTER — Encounter: Payer: Self-pay | Admitting: Cardiovascular Disease

## 2012-01-17 VITALS — BP 168/97 | HR 71 | Ht 63.0 in | Wt 133.0 lb

## 2012-01-17 DIAGNOSIS — I10 Essential (primary) hypertension: Secondary | ICD-10-CM | POA: Diagnosis not present

## 2012-01-17 DIAGNOSIS — I509 Heart failure, unspecified: Secondary | ICD-10-CM

## 2012-01-17 DIAGNOSIS — I872 Venous insufficiency (chronic) (peripheral): Secondary | ICD-10-CM

## 2012-01-17 DIAGNOSIS — I499 Cardiac arrhythmia, unspecified: Secondary | ICD-10-CM

## 2012-01-17 NOTE — Patient Instructions (Signed)
Your physician wants you to follow-up in: [facility] will receive a reminder letter in the mail two months in advance. If you don't receive a letter, please call our office to schedule the follow-up appointment. Your physician recommends that you continue on your current medications as directed. Please refer to the Current Medication list given to you today. Your physician has requested that you have an echocardiogram. Echocardiography is a painless test that uses sound waves to create images of your heart. It provides your doctor with information about the size and shape of your heart and how well your heart's chambers and valves are working. This procedure takes approximately one hour. There are no restrictions for this procedure. 6 MONTHS

## 2012-01-17 NOTE — Assessment & Plan Note (Signed)
Stable.  Had good DVT prophylaxis with TKR.

## 2012-01-17 NOTE — Progress Notes (Signed)
78 yo referred for irregular heart beat. Chronic asymptomatic. Noted as far back as 2010 before knee surgery. Actually has PAC;s with multifocal origin. No palpitations, dsypnea, SSCP or syncope. No afib and no TIA. Still working 3 days/week at [employer]. Reviewed dobutamine echo from 2010 and normal with multifocal PAC/MAT. Has HTN Rx with calcium blocker. Not on beta blocker. Multinodular goiter with normal thyroid labs this year. Denies SSCP, dyspnea, neuro symptoms,  Had echo 04/26/11  EF 35% with diffuse hypokinesis Carotid same day also reviewed 0-39% bilateral disease.  She is asymptomatic. Had XRT and chemo a long time ago for her breast CA. Will need myovue to R/O CAD. Discussed diagnosis of DCM with her. Will need to stop calcium blocker and adjust ACE. With PAC/MAT add beta blocker.  Hd  TKR with Noemi Chapel [id number]  No complications   Reviewed myovue from 05/18/11 EF 32% no ischemia   Echo 04/26/11  Study Conclusions  - Left ventricle: The cavity size was normal. Wall thickness was normal. Systolic function was moderately reduced. The estimated ejection fraction was in the range of 35% to 40%. Diffuse hypokinesis. Doppler parameters are consistent with abnormal left ventricular relaxation (grade 1 diastolic dysfunction). - Aortic valve: There was no stenosis. - Mitral valve: Trivial regurgitation. - Left atrium: The atrium was moderately dilated. - Right ventricle: The cavity size was normal. Systolic function was normal. - Right atrium: The atrium was mildly dilated. - Tricuspid valve: Peak RV-RA gradient: 34mm Hg (S). - Pulmonary arteries: PA peak pressure: 79mm Hg (S). - Inferior vena cava: The vessel was normal in size; the respirophasic diameter changes were in the normal range (= 50%); findings are consistent with normal central venous pressure. - Pericardium, extracardiac: There appears to be a 7 x 5 cm heterogeneous extracardiac mass posterior to the left atrium and the  atrioventricular groove.  ROS: Denies fever, malais, weight loss, blurry vision, decreased visual acuity, cough, sputum, SOB, hemoptysis, pleuritic pain, palpitaitons, heartburn, abdominal pain, melena, lower extremity edema, claudication, or rash.  All other systems reviewed and negative  General: Affect appropriate Healthy:  appears stated age 78: normal Neck supple with no adenopathy JVP normal no bruits no thyromegaly Lungs clear with no wheezing and good diaphragmatic motion Heart:  S1/S2 no murmur, no rub, gallop or click PMI normal Abdomen: benighn, BS positve, no tenderness, no AAA no bruit.  No HSM or HJR Distal pulses intact with no bruits Trace LLE edema S/P bilateral TKR's most recently on left Neuro non-focal Skin warm and dry No muscular weakness   Current Outpatient Prescriptions  Medication Sig Dispense Refill  . ALPRAZolam (XANAX) 0.25 MG tablet Take 1-2 tablets (0.25-0.5 mg total) by mouth 3 (three) times daily as needed. For anxiety  90 tablet  3  . aspirin 325 MG tablet Take 325 mg by mouth daily.      . Calcium Carbonate-Vitamin D (CALCIUM 600+D) 600-400 MG-UNIT per tablet Take 1 tablet by mouth daily.      . carvedilol (COREG) 12.5 MG tablet Take 1 tablet (12.5 mg total) by mouth 2 (two) times daily.  180 tablet  3  . ferrous sulfate 325 (65 FE) MG tablet Take 325 mg by mouth daily with breakfast.      . losartan (COZAAR) 100 MG tablet Take 100 mg by mouth daily.      . vitamin C (ASCORBIC ACID) 500 MG tablet Take 500 mg by mouth daily as needed. If getting a cold       .  vitamin E 400 UNIT capsule Take 400 Units by mouth. OCCASIONAL        Allergies  Cefuroxime; Celecoxib; Codeine; and Tramadol hcl  Electrocardiogram:  Assessment and Plan

## 2012-01-17 NOTE — Assessment & Plan Note (Signed)
No afib, History of multifocal PAC;s benign and asymptomatic

## 2012-01-17 NOTE — Assessment & Plan Note (Signed)
Well controlled.  Continue current medications and low sodium Dash type diet.    

## 2012-01-17 NOTE — Assessment & Plan Note (Signed)
Nonischemic DCM  F/U echo in 6 months for EF  Continue current meds

## 2012-03-13 DIAGNOSIS — Z1231 Encounter for screening mammogram for malignant neoplasm of breast: Secondary | ICD-10-CM | POA: Diagnosis not present

## 2012-04-01 ENCOUNTER — Encounter: Payer: Self-pay | Admitting: Internal Medicine

## 2012-04-10 ENCOUNTER — Ambulatory Visit (INDEPENDENT_AMBULATORY_CARE_PROVIDER_SITE_OTHER): Payer: Medicare Other | Admitting: Internal Medicine

## 2012-04-10 ENCOUNTER — Encounter: Payer: Self-pay | Admitting: Internal Medicine

## 2012-04-10 ENCOUNTER — Other Ambulatory Visit (INDEPENDENT_AMBULATORY_CARE_PROVIDER_SITE_OTHER): Payer: Medicare Other

## 2012-04-10 VITALS — BP 138/76 | HR 76 | Temp 98.2°F | Resp 16 | Wt 135.0 lb

## 2012-04-10 DIAGNOSIS — F411 Generalized anxiety disorder: Secondary | ICD-10-CM

## 2012-04-10 DIAGNOSIS — D649 Anemia, unspecified: Secondary | ICD-10-CM

## 2012-04-10 DIAGNOSIS — E0590 Thyrotoxicosis, unspecified without thyrotoxic crisis or storm: Secondary | ICD-10-CM

## 2012-04-10 DIAGNOSIS — I10 Essential (primary) hypertension: Secondary | ICD-10-CM

## 2012-04-10 DIAGNOSIS — M810 Age-related osteoporosis without current pathological fracture: Secondary | ICD-10-CM | POA: Diagnosis not present

## 2012-04-10 LAB — CBC WITH DIFFERENTIAL/PLATELET
Eosinophils Absolute: 0.2 10*3/uL (ref 0.0–0.7)
Lymphocytes Relative: 28.1 % (ref 12.0–46.0)
MCHC: 33.2 g/dL (ref 30.0–36.0)
MCV: 92.2 fl (ref 78.0–100.0)
Monocytes Absolute: 0.6 10*3/uL (ref 0.1–1.0)
Neutrophils Relative %: 58.8 % (ref 43.0–77.0)
Platelets: 245 10*3/uL (ref 150.0–400.0)
WBC: 6.8 10*3/uL (ref 4.5–10.5)

## 2012-04-10 LAB — BASIC METABOLIC PANEL
Chloride: 104 mEq/L (ref 96–112)
GFR: 60.78 mL/min (ref >60.00)
Potassium: 4.5 mEq/L (ref 3.5–5.1)
Sodium: 140 mEq/L (ref 135–145)

## 2012-04-10 LAB — IBC PANEL
Iron: 85 ug/dL (ref 42–145)
Transferrin: 312 mg/dL (ref 212.0–360.0)

## 2012-04-10 MED ORDER — ALPRAZOLAM 0.25 MG PO TABS: 0.2500 mg | ORAL_TABLET | Freq: Three times a day (TID) | ORAL | Status: DC | PRN

## 2012-04-10 MED ORDER — CARVEDILOL 12.5 MG PO TABS: 12.5000 mg | ORAL_TABLET | Freq: Two times a day (BID) | ORAL | Status: DC
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
## 2012-04-10 NOTE — Assessment & Plan Note (Signed)
Continue with current prescription therapy as reflected on the Med list.  

## 2012-04-10 NOTE — Assessment & Plan Note (Signed)
Vit D 

## 2012-04-10 NOTE — Assessment & Plan Note (Signed)
labs

## 2012-05-01 DIAGNOSIS — Z23 Encounter for immunization: Secondary | ICD-10-CM | POA: Diagnosis not present

## 2012-05-02 DIAGNOSIS — M1710 Unilateral primary osteoarthritis, unspecified knee: Secondary | ICD-10-CM | POA: Diagnosis not present

## 2012-05-02 DIAGNOSIS — Z471 Aftercare following joint replacement surgery: Secondary | ICD-10-CM | POA: Diagnosis not present

## 2012-06-27 ENCOUNTER — Ambulatory Visit (INDEPENDENT_AMBULATORY_CARE_PROVIDER_SITE_OTHER): Payer: Medicare Other | Admitting: Internal Medicine

## 2012-06-27 ENCOUNTER — Encounter: Payer: Self-pay | Admitting: Internal Medicine

## 2012-06-27 VITALS — BP 150/82 | HR 64 | Temp 97.3°F | Resp 16 | Wt 139.0 lb

## 2012-06-27 DIAGNOSIS — R0989 Other specified symptoms and signs involving the circulatory and respiratory systems: Secondary | ICD-10-CM | POA: Diagnosis not present

## 2012-06-27 DIAGNOSIS — E0590 Thyrotoxicosis, unspecified without thyrotoxic crisis or storm: Secondary | ICD-10-CM | POA: Diagnosis not present

## 2012-06-27 DIAGNOSIS — R05 Cough: Secondary | ICD-10-CM

## 2012-06-27 DIAGNOSIS — H6120 Impacted cerumen, unspecified ear: Secondary | ICD-10-CM | POA: Diagnosis not present

## 2012-06-27 DIAGNOSIS — R0609 Other forms of dyspnea: Secondary | ICD-10-CM | POA: Diagnosis not present

## 2012-06-27 DIAGNOSIS — J45909 Unspecified asthma, uncomplicated: Secondary | ICD-10-CM | POA: Insufficient documentation

## 2012-06-27 DIAGNOSIS — R062 Wheezing: Secondary | ICD-10-CM | POA: Insufficient documentation

## 2012-06-27 DIAGNOSIS — I10 Essential (primary) hypertension: Secondary | ICD-10-CM | POA: Diagnosis not present

## 2012-06-27 MED ORDER — LOSARTAN POTASSIUM 100 MG PO TABS: 100.0000 mg | ORAL_TABLET | Freq: Every day | ORAL | Status: DC

## 2012-06-27 MED ORDER — CARVEDILOL 12.5 MG PO TABS: 12.5000 mg | ORAL_TABLET | Freq: Two times a day (BID) | ORAL | Status: DC

## 2012-06-27 MED ORDER — RANITIDINE HCL 300 MG PO CAPS: 300.0000 mg | ORAL_CAPSULE | Freq: Every evening | ORAL | Status: DC

## 2012-06-27 MED ORDER — DOXYCYCLINE HYCLATE 100 MG PO TABS: 100.0000 mg | ORAL_TABLET | Freq: Two times a day (BID) | ORAL | Status: DC

## 2012-06-27 MED ORDER — FLUTICASONE-SALMETEROL 250-50 MCG/DOSE IN AEPB: 1.0000 | INHALATION_SPRAY | Freq: Two times a day (BID) | RESPIRATORY_TRACT | Status: DC

## 2012-06-27 MED ORDER — AZITHROMYCIN 250 MG PO TABS: ORAL_TABLET | ORAL | Status: DC

## 2012-06-27 MED ORDER — METHYLPREDNISOLONE ACETATE 80 MG/ML IJ SUSP
120.0000 mg | Freq: Once | INTRAMUSCULAR | Status: AC
  Administered 2012-06-27: 120 mg via INTRAMUSCULAR

## 2012-06-27 NOTE — Assessment & Plan Note (Signed)
Poss URI

## 2012-06-27 NOTE — Progress Notes (Signed)
Subjective:    Patient ID: Mia Contreras, female    DOB: [birth year], 78 y.o.   MRN: [id number]  Cough This is a new problem. The current episode started more than 1 month ago. The problem has been unchanged. The problem occurs hourly. The cough is productive of sputum. Associated symptoms include wheezing. Pertinent negatives include no chest pain, chills, headaches or rash. The symptoms are aggravated by exercise. The treatment provided no relief. There is no history of asthma or COPD.  Wheezing  This is a new problem. The current episode started more than 1 month ago. The problem occurs 2 to 4 times per day. Associated symptoms include coughing. Pertinent negatives include no abdominal pain, chest pain, chills, headaches or rash. The symptoms are aggravated by exercise. There is no history of asthma or COPD.      She had L TKR in 3/13 - doing well  The patient presents for a follow-up of  chronic hypertension, chronic dyslipidemia, knee OA - not controlled with medicines. No wt loss   Wt Readings from Last 3 Encounters:  06/27/12 139 lb (63.05 kg)  04/10/12 135 lb (61.236 kg)  01/17/12 133 lb (60.328 kg)   BP Readings from Last 3 Encounters:  06/27/12 150/82  04/10/12 138/76  01/17/12 168/97      Past Medical History  Diagnosis Date  . GOITER, MULTINODULAR 05/06/2010    Dr [name]  . HYPERTHYROIDISM 05/23/2010  . HYPERLIPIDEMIA 08/20/2007  . ANEMIA-NOS 08/20/2007  . ANXIETY 03/16/2007  . CARPAL TUNNEL SYNDROME, RIGHT 01/11/2008  . HYPERTENSION 03/16/2007  . VENOUS INSUFFICIENCY 09/05/2007  . DIVERTICULOSIS, COLON 08/20/2007  . CELLULITIS, LEG, RIGHT 08/20/2007  . OSTEOARTHRITIS 03/16/2007  . OSTEOPOROSIS 08/20/2007  . SCOLIOSIS 01/11/2008  . BREAST CANCER, HX OF 03/16/2007  . SKIN CANCER, HX OF 11/12/2008     R cheek 2010, L Cheek 2011 Dr. Tonia Brooms  . PREMATURE ATRIAL CONTRACTIONS 09/21/2010  . Thyroid nodule   . Cataract     floaters  . Cancer     left breaast  .  Pneumonia 07/2011    took abx for several weeks   Past Surgical History  Procedure Date  . Total knee arthroplasty   . Breast lumpectomy 2001  . Abdominal hysterectomy   . Appendectomy 1972  . Mastectomy, partial     s/p left  . Carpal tunnel release   . Total knee arthroplasty 2010    Right-Dr Para March  . Cataract extraction   . Skin cancer excision     face-Dr. Bing Plume  . Xrt     chemo, surgery  . Eye surgery     cataract ext  . Total knee arthroplasty 10/30/2011    Procedure: TOTAL KNEE ARTHROPLASTY;  Surgeon: Lorn Junes, MD;  Location: [facility];  Service: Orthopedics;  Laterality: Left;   Dr Noemi Chapel would like 90 mins for his Total Cases    reports that she quit smoking about 31 years ago. She has never used smokeless tobacco. She reports that she does not drink alcohol or use illicit drugs. family history includes Alcohol abuse in her father; Alzheimer's disease in her mother; Cancer in her brother; Dementia in her mother; Diabetes in her other; Heart attack in her father; Heart disease in her mother; Hypertension in her mother; Mental illness in her mother; and Mental retardation in her mother.  There is no history of Anesthesia problems. Allergies  Allergen Reactions  . Cefuroxime Diarrhea  . Celecoxib Nausea Only  . Codeine  Nausea Only  . Tramadol Hcl Nausea Only   Current Outpatient Prescriptions on File Prior to Visit  Medication Sig Dispense Refill  . ALPRAZolam (XANAX) 0.25 MG tablet Take 1-2 tablets (0.25-0.5 mg total) by mouth 3 (three) times daily as needed. For anxiety  90 tablet  3  . aspirin 325 MG tablet Take 325 mg by mouth daily.      . Calcium Carbonate-Vitamin D (CALCIUM 600+D) 600-400 MG-UNIT per tablet Take 1 tablet by mouth daily.      . carvedilol (COREG) 12.5 MG tablet Take 1 tablet (12.5 mg total) by mouth 2 (two) times daily.  180 tablet  3  . losartan (COZAAR) 100 MG tablet Take 100 mg by mouth daily.      . ferrous sulfate 325 (65 FE) MG tablet  Take 325 mg by mouth daily with breakfast.       BP 150/82  Pulse 64  Temp 97.3 F (36.3 C) (Oral)  Resp 16  Wt 139 lb (63.05 kg)   Review of Systems  Constitutional: Negative for chills, activity change, appetite change, fatigue and unexpected weight change.  HENT: Positive for congestion. Negative for mouth sores and sinus pressure.   Eyes: Negative for visual disturbance.  Respiratory: Positive for cough and wheezing. Negative for chest tightness.   Cardiovascular: Negative for chest pain.  Gastrointestinal: Negative for nausea and abdominal pain.  Genitourinary: Negative for frequency, difficulty urinating and vaginal pain.  Musculoskeletal: Positive for joint swelling (L knee hurts) and gait problem. Negative for back pain.  Skin: Negative for pallor and rash.  Neurological: Negative for dizziness, tremors, weakness, numbness and headaches.  Psychiatric/Behavioral: Negative for suicidal ideas, confusion and sleep disturbance. The patient is not nervous/anxious.        Objective:   Physical Exam  Constitutional: She appears well-developed and well-nourished. No distress.  HENT:  Head: Normocephalic.  Right Ear: External ear normal.  Left Ear: External ear normal.  Nose: Nose normal.       Nasal mucosa is swollen  Eyes: Conjunctivae normal are normal. Pupils are equal, round, and reactive to light. Right eye exhibits no discharge. Left eye exhibits no discharge.  Neck: Normal range of motion. Neck supple. No JVD present. No tracheal deviation present. No thyromegaly present.  Cardiovascular: Normal rate, regular rhythm and normal heart sounds.   Pulmonary/Chest: No stridor. No respiratory distress. She has no wheezes.  Abdominal: Soft. Bowel sounds are normal. She exhibits no distension and no mass. There is no tenderness. There is no rebound and no guarding.  Musculoskeletal: She exhibits tenderness (L knee). She exhibits no edema.  Lymphadenopathy:    She has no cervical  adenopathy.  Neurological: She displays normal reflexes. No cranial nerve deficit. She exhibits normal muscle tone. Coordination normal.  Skin: No rash noted. No erythema.  Psychiatric: She has a normal mood and affect. Her behavior is normal. Judgment and thought content normal.  L knee w/a post-op scar Wax in R ear Lab Results  Component Value Date   WBC 6.8 04/10/2012   HGB 12.2 04/10/2012   HCT 36.9 04/10/2012   PLT 245.0 04/10/2012   GLUCOSE 86 04/10/2012   CHOL 172 03/23/2011   TRIG 78.0 03/23/2011   HDL 63.80 03/23/2011   LDLCALC 93 03/23/2011   ALT 14 10/25/2011   AST 22 10/25/2011   NA 140 04/10/2012   K 4.5 04/10/2012   CL 104 04/10/2012   CREATININE 0.9 04/10/2012   BUN 19 04/10/2012   CO2  31 04/10/2012   TSH 1.81 12/08/202013   INR 0.95 10/25/2011   HGBA1C 5.6 09/08/2009   MICROALBUR 0.6 03/23/2011    Procedure Note :     Procedure :  Ear irrigation   Indication:  Cerumen impaction   Risks, including pain, dizziness, eardrum perforation, bleeding, infection and others as well as benefits were explained to the patient in detail. Verbal consent was obtained and the patient agreed to proceed.    We used "[employer]" field with lukewarm water for irrigation. A large amount wax was recovered. Procedure has also required manual wax removal with an ear loop.   Tolerated well. Complications: None.   Postprocedure instructions :  Call if problems.  I personally provided the advair inhaler use teaching. After the teaching patient was able to demonstrate it's use effectively. All questions were answered     Assessment & Plan:

## 2012-06-27 NOTE — Assessment & Plan Note (Signed)
Will irrigate 

## 2012-06-27 NOTE — Assessment & Plan Note (Signed)
Continue with current prescription therapy as reflected on the Med list.  

## 2012-06-27 NOTE — Assessment & Plan Note (Signed)
F/u w/Dr [place] 

## 2012-07-05 ENCOUNTER — Telehealth: Payer: Self-pay

## 2012-07-05 NOTE — Telephone Encounter (Signed)
Noted - sorry! To use OTC Prilosec Thx

## 2012-07-05 NOTE — Telephone Encounter (Signed)
Pt called stating that Ranitidine caused abdominal bloating, nausea and and lower abdominal pain. Pt decided to discontinued medication and wanted MD to be aware.

## 2012-07-05 NOTE — Telephone Encounter (Signed)
Pt advised via VM 

## 2012-07-29 ENCOUNTER — Other Ambulatory Visit: Payer: Self-pay | Admitting: Internal Medicine

## 2012-08-01 ENCOUNTER — Other Ambulatory Visit: Payer: Self-pay | Admitting: Dermatology

## 2012-08-01 DIAGNOSIS — D0470 Carcinoma in situ of skin of unspecified lower limb, including hip: Secondary | ICD-10-CM | POA: Diagnosis not present

## 2012-08-01 DIAGNOSIS — L821 Other seborrheic keratosis: Secondary | ICD-10-CM | POA: Diagnosis not present

## 2012-08-01 DIAGNOSIS — D485 Neoplasm of uncertain behavior of skin: Secondary | ICD-10-CM | POA: Diagnosis not present

## 2012-08-01 DIAGNOSIS — L570 Actinic keratosis: Secondary | ICD-10-CM | POA: Diagnosis not present

## 2012-08-01 DIAGNOSIS — Z85828 Personal history of other malignant neoplasm of skin: Secondary | ICD-10-CM | POA: Diagnosis not present

## 2012-08-01 DIAGNOSIS — L820 Inflamed seborrheic keratosis: Secondary | ICD-10-CM | POA: Diagnosis not present

## 2012-08-07 ENCOUNTER — Ambulatory Visit (INDEPENDENT_AMBULATORY_CARE_PROVIDER_SITE_OTHER): Payer: Medicare Other | Admitting: Internal Medicine

## 2012-08-07 ENCOUNTER — Ambulatory Visit: Payer: Medicare Other | Admitting: Endocrinology

## 2012-08-07 ENCOUNTER — Ambulatory Visit (INDEPENDENT_AMBULATORY_CARE_PROVIDER_SITE_OTHER): Payer: Medicare Other | Admitting: Endocrinology

## 2012-08-07 ENCOUNTER — Other Ambulatory Visit (INDEPENDENT_AMBULATORY_CARE_PROVIDER_SITE_OTHER): Payer: Medicare Other

## 2012-08-07 ENCOUNTER — Encounter: Payer: Self-pay | Admitting: Internal Medicine

## 2012-08-07 ENCOUNTER — Encounter: Payer: Self-pay | Admitting: Endocrinology

## 2012-08-07 VITALS — BP 140/70 | HR 63 | Temp 98.4°F | Wt 140.0 lb

## 2012-08-07 VITALS — BP 160/78 | HR 80 | Temp 97.3°F | Resp 16 | Wt 140.0 lb

## 2012-08-07 DIAGNOSIS — E042 Nontoxic multinodular goiter: Secondary | ICD-10-CM | POA: Diagnosis not present

## 2012-08-07 DIAGNOSIS — D62 Acute posthemorrhagic anemia: Secondary | ICD-10-CM

## 2012-08-07 DIAGNOSIS — N390 Urinary tract infection, site not specified: Secondary | ICD-10-CM

## 2012-08-07 DIAGNOSIS — E0590 Thyrotoxicosis, unspecified without thyrotoxic crisis or storm: Secondary | ICD-10-CM | POA: Diagnosis not present

## 2012-08-07 DIAGNOSIS — J449 Chronic obstructive pulmonary disease, unspecified: Secondary | ICD-10-CM

## 2012-08-07 DIAGNOSIS — I10 Essential (primary) hypertension: Secondary | ICD-10-CM | POA: Diagnosis not present

## 2012-08-07 LAB — CBC WITH DIFFERENTIAL/PLATELET
Eosinophils Relative: 2.6 % (ref 0.0–5.0)
HCT: 36.7 % (ref 36.0–46.0)
Lymphs Abs: 1.5 10*3/uL (ref 0.7–4.0)
MCV: 91 fl (ref 78.0–100.0)
Monocytes Absolute: 0.6 10*3/uL (ref 0.1–1.0)
Platelets: 341 10*3/uL (ref 150.0–400.0)
WBC: 6.3 10*3/uL (ref 4.5–10.5)

## 2012-08-07 LAB — URINALYSIS
Leukocytes, UA: NEGATIVE
Nitrite: NEGATIVE
Specific Gravity, Urine: 1.02 (ref 1.000–1.030)
Urobilinogen, UA: 0.2 (ref 0.0–1.0)

## 2012-08-07 LAB — BASIC METABOLIC PANEL
CO2: 29 mEq/L (ref 19–32)
Calcium: 9.5 mg/dL (ref 8.4–10.5)
Creatinine, Ser: 1 mg/dL (ref 0.4–1.2)
Glucose, Bld: 80 mg/dL (ref 70–99)

## 2012-08-07 LAB — HEPATIC FUNCTION PANEL
Albumin: 3.8 g/dL (ref 3.5–5.2)
Bilirubin, Direct: 0 mg/dL (ref 0.0–0.3)
Total Protein: 6.9 g/dL (ref 6.0–8.3)

## 2012-08-07 LAB — TSH: TSH: 1.47 u[IU]/mL (ref 0.35–5.50)

## 2012-08-07 MED ORDER — CARVEDILOL 12.5 MG PO TABS: 12.5000 mg | ORAL_TABLET | Freq: Two times a day (BID) | ORAL | Status: DC

## 2012-08-07 MED ORDER — METHYLPREDNISOLONE ACETATE 80 MG/ML IJ SUSP
120.0000 mg | Freq: Once | INTRAMUSCULAR | Status: AC
  Administered 2012-08-07: 120 mg via INTRAMUSCULAR

## 2012-08-07 MED ORDER — NORTRIPTYLINE HCL 10 MG PO CAPS: 10.0000 mg | ORAL_CAPSULE | Freq: Every day | ORAL | Status: DC

## 2012-08-07 MED ORDER — DOXYCYCLINE HYCLATE 100 MG PO TABS: 100.0000 mg | ORAL_TABLET | Freq: Two times a day (BID) | ORAL | Status: DC

## 2012-08-07 NOTE — Assessment & Plan Note (Signed)
Continue with current prescription therapy as reflected on the Med list.  

## 2012-08-07 NOTE — Assessment & Plan Note (Signed)
UA

## 2012-08-07 NOTE — Progress Notes (Signed)
Subjective:    Patient ID: Mia Contreras, female    DOB: 1929/02/16, 78 y.o.   MRN: [id number]  HPI Pt is 2 years s/p i-131 rx for hyperthyroidism, due to a large multinodular goiter.  she feels as though the goiter is slightly smaller.   Past Medical History  Diagnosis Date  . GOITER, MULTINODULAR 05/06/2010    Dr [name]  . HYPERTHYROIDISM 05/23/2010  . HYPERLIPIDEMIA 08/20/2007  . ANEMIA-NOS 08/20/2007  . ANXIETY 03/16/2007  . CARPAL TUNNEL SYNDROME, RIGHT 01/11/2008  . HYPERTENSION 03/16/2007  . VENOUS INSUFFICIENCY 09/05/2007  . DIVERTICULOSIS, COLON 08/20/2007  . CELLULITIS, LEG, RIGHT 08/20/2007  . OSTEOARTHRITIS 03/16/2007  . OSTEOPOROSIS 08/20/2007  . SCOLIOSIS 01/11/2008  . BREAST CANCER, HX OF 03/16/2007  . SKIN CANCER, HX OF 11/12/2008     R cheek 2010, L Cheek 2011 Dr. Tonia Brooms  . PREMATURE ATRIAL CONTRACTIONS 09/21/2010  . Thyroid nodule   . Cataract     floaters  . Cancer     left breaast  . Pneumonia 07/2011    took abx for several weeks    Past Surgical History  Procedure Date  . Total knee arthroplasty   . Breast lumpectomy 2001  . Abdominal hysterectomy   . Appendectomy 1972  . Mastectomy, partial     s/p left  . Carpal tunnel release   . Total knee arthroplasty 2010    Right-Dr Para March  . Cataract extraction   . Skin cancer excision     face-Dr. Bing Plume  . Xrt     chemo, surgery  . Eye surgery     cataract ext  . Total knee arthroplasty 10/30/2011    Procedure: TOTAL KNEE ARTHROPLASTY;  Surgeon: Lorn Junes, MD;  Location: [facility];  Service: Orthopedics;  Laterality: Left;   Dr Noemi Chapel would like 90 mins for his Total Cases    History   Social History  . Marital Status: Single    Spouse Name: N/A    Number of Children: N/A  . Years of Education: N/A   Occupational History  . Not on file.   Social History Main Topics  . Smoking status: Former Smoker    Quit date: 12/20/1980  . Smokeless tobacco: Never Used  . Alcohol Use: No     Comment:  OCCASIONAL  . Drug Use: No  . Sexually Active: Not Currently   Other Topics Concern  . Not on file   Social History Narrative  . No narrative on file    Current Outpatient Prescriptions on File Prior to Visit  Medication Sig Dispense Refill  . ALPRAZolam (XANAX) 0.25 MG tablet Take 1-2 tablets (0.25-0.5 mg total) by mouth 3 (three) times daily as needed. For anxiety  90 tablet  3  . aspirin 325 MG tablet Take 325 mg by mouth daily.      . Calcium Carbonate-Vitamin D (CALCIUM 600+D) 600-400 MG-UNIT per tablet Take 1 tablet by mouth daily.      . ferrous sulfate 325 (65 FE) MG tablet Take 325 mg by mouth daily with breakfast.      . Fluticasone-Salmeterol (ADVAIR DISKUS) 250-50 MCG/DOSE AEPB Inhale 1 puff into the lungs 2 (two) times daily.  1 each  3  . losartan (COZAAR) 100 MG tablet TAKE 1 TABLET BY MOUTH EVERY DAY  30 tablet  11  . Multiple Vitamins-Minerals (MULTIVITAMIN PO) Take 1 each by mouth daily.      . carvedilol (COREG) 12.5 MG tablet Take 1 tablet (12.5 mg  total) by mouth 2 (two) times daily.  180 tablet  3  . doxycycline (VIBRA-TABS) 100 MG tablet Take 1 tablet (100 mg total) by mouth 2 (two) times daily.  20 tablet  0  . nortriptyline (PAMELOR) 10 MG capsule Take 1-2 capsules (10-20 mg total) by mouth at bedtime. 5-7 pm  60 capsule  5    Allergies  Allergen Reactions  . Cefuroxime Diarrhea  . Celecoxib Nausea Only  . Codeine Nausea Only  . Ranitidine     bloating  . Tramadol Hcl Nausea Only    Family History  Problem Relation Age of Onset  . Dementia Mother   . Heart disease Mother   . Mental retardation Mother   . Mental illness Mother     dementia  . Hypertension Mother   . Alzheimer's disease Mother   . Cancer Brother     Prostate Cancer  . Diabetes Other   . Heart attack Father   . Alcohol abuse Father   . Anesthesia problems Neg Hx     BP 140/70  Pulse 63  Temp 98.4 F (36.9 C) (Oral)  Wt 140 lb (63.504 kg)  SpO2 94%  Review of  Systems Denies weight change    Objective:   Physical Exam VITAL SIGNS:  See vs page GENERAL: no distress Thyroid is 10x normal size, left > right.  Lab Results  Component Value Date   TSH 1.47 08/07/2012      Assessment & Plan:  Large multinodular goiter, clinically unchanged Hyperthyroidism, resolved with i-131 rx.  She is at risk for recurrence, though.

## 2012-08-07 NOTE — Assessment & Plan Note (Signed)
CBC

## 2012-08-07 NOTE — Patient Instructions (Signed)
Afrin nasal spry for flying

## 2012-08-07 NOTE — Assessment & Plan Note (Signed)
Doxy if worse Afrin for flying Depomedr 120 mg im

## 2012-08-07 NOTE — Patient Instructions (Addendum)
blood tests are being requested for you today.  We'll contact you with results. most of the time, a "lumpy thyroid" will eventually become overactive again.  this may take as long as many years to happen. Please return in 1 year.

## 2012-08-07 NOTE — Progress Notes (Signed)
Subjective:    Patient ID: Mia Contreras, female    DOB: 1928-09-20, 78 y.o.   MRN: [id number]  HPI  C/o fatigue, not sleeping well; a new URI. She is flying to [facility] soon  She had L TKR in 3/13 - doing well  The patient presents for a follow-up of  chronic hypertension, chronic dyslipidemia, knee OA - not controlled with medicines. No wt loss   Wt Readings from Last 3 Encounters:  08/07/12 140 lb (63.504 kg)  08/07/12 140 lb (63.504 kg)  06/27/12 139 lb (63.05 kg)   BP Readings from Last 3 Encounters:  08/07/12 160/78  08/07/12 140/70  06/27/12 150/82      Past Medical History  Diagnosis Date  . GOITER, MULTINODULAR 05/06/2010    Dr [name]  . HYPERTHYROIDISM 05/23/2010  . HYPERLIPIDEMIA 08/20/2007  . ANEMIA-NOS 08/20/2007  . ANXIETY 03/16/2007  . CARPAL TUNNEL SYNDROME, RIGHT 01/11/2008  . HYPERTENSION 03/16/2007  . VENOUS INSUFFICIENCY 09/05/2007  . DIVERTICULOSIS, COLON 08/20/2007  . CELLULITIS, LEG, RIGHT 08/20/2007  . OSTEOARTHRITIS 03/16/2007  . OSTEOPOROSIS 08/20/2007  . SCOLIOSIS 01/11/2008  . BREAST CANCER, HX OF 03/16/2007  . SKIN CANCER, HX OF 11/12/2008     R cheek 2010, L Cheek 2011 Dr. Tonia Brooms  . PREMATURE ATRIAL CONTRACTIONS 09/21/2010  . Thyroid nodule   . Cataract     floaters  . Cancer     left breaast  . Pneumonia 07/2011    took abx for several weeks   Past Surgical History  Procedure Date  . Total knee arthroplasty   . Breast lumpectomy 2001  . Abdominal hysterectomy   . Appendectomy 1972  . Mastectomy, partial     s/p left  . Carpal tunnel release   . Total knee arthroplasty 2010    Right-Dr Para March  . Cataract extraction   . Skin cancer excision     face-Dr. Bing Plume  . Xrt     chemo, surgery  . Eye surgery     cataract ext  . Total knee arthroplasty 10/30/2011    Procedure: TOTAL KNEE ARTHROPLASTY;  Surgeon: Lorn Junes, MD;  Location: [facility];  Service: Orthopedics;  Laterality: Left;   Dr Noemi Chapel would like 90 mins for his Total  Cases    reports that she quit smoking about 31 years ago. She has never used smokeless tobacco. She reports that she does not drink alcohol or use illicit drugs. family history includes Alcohol abuse in her father; Alzheimer's disease in her mother; Cancer in her brother; Dementia in her mother; Diabetes in her other; Heart attack in her father; Heart disease in her mother; Hypertension in her mother; Mental illness in her mother; and Mental retardation in her mother.  There is no history of Anesthesia problems. Allergies  Allergen Reactions  . Cefuroxime Diarrhea  . Celecoxib Nausea Only  . Codeine Nausea Only  . Ranitidine     bloating  . Tramadol Hcl Nausea Only   Current Outpatient Prescriptions on File Prior to Visit  Medication Sig Dispense Refill  . ALPRAZolam (XANAX) 0.25 MG tablet Take 1-2 tablets (0.25-0.5 mg total) by mouth 3 (three) times daily as needed. For anxiety  90 tablet  3  . aspirin 325 MG tablet Take 325 mg by mouth daily.      . Calcium Carbonate-Vitamin D (CALCIUM 600+D) 600-400 MG-UNIT per tablet Take 1 tablet by mouth daily.      . carvedilol (COREG) 12.5 MG tablet Take 1 tablet (12.5  mg total) by mouth 2 (two) times daily.  180 tablet  3  . doxycycline (VIBRA-TABS) 100 MG tablet Take 1 tablet (100 mg total) by mouth 2 (two) times daily.  20 tablet  0  . Fluticasone-Salmeterol (ADVAIR DISKUS) 250-50 MCG/DOSE AEPB Inhale 1 puff into the lungs 2 (two) times daily.  1 each  3  . losartan (COZAAR) 100 MG tablet TAKE 1 TABLET BY MOUTH EVERY DAY  30 tablet  11  . Multiple Vitamins-Minerals (MULTIVITAMIN PO) Take 1 each by mouth daily.      . ferrous sulfate 325 (65 FE) MG tablet Take 325 mg by mouth daily with breakfast.       BP 160/78  Pulse 80  Temp 97.3 F (36.3 C) (Oral)  Resp 16  Wt 140 lb (63.504 kg)   Review of Systems  Constitutional: Negative for activity change, appetite change, fatigue and unexpected weight change.  HENT: Positive for congestion.  Negative for mouth sores and sinus pressure.   Eyes: Negative for visual disturbance.  Respiratory: Negative for chest tightness.   Gastrointestinal: Negative for nausea.  Genitourinary: Negative for frequency, difficulty urinating and vaginal pain.  Musculoskeletal: Positive for joint swelling (L knee hurts) and gait problem. Negative for back pain.  Skin: Negative for pallor.  Neurological: Negative for dizziness, tremors, weakness and numbness.  Psychiatric/Behavioral: Negative for suicidal ideas, confusion and sleep disturbance. The patient is not nervous/anxious.        Objective:   Physical Exam  Constitutional: She appears well-developed and well-nourished. No distress.  HENT:  Head: Normocephalic.  Right Ear: External ear normal.  Left Ear: External ear normal.  Nose: Nose normal.       Nasal mucosa is swollen  Eyes: Conjunctivae normal are normal. Pupils are equal, round, and reactive to light. Right eye exhibits no discharge. Left eye exhibits no discharge.  Neck: Normal range of motion. Neck supple. No JVD present. No tracheal deviation present. No thyromegaly present.  Cardiovascular: Normal rate, regular rhythm and normal heart sounds.   Pulmonary/Chest: No stridor. No respiratory distress. She has no wheezes.  Abdominal: Soft. Bowel sounds are normal. She exhibits no distension and no mass. There is no tenderness. There is no rebound and no guarding.  Musculoskeletal: She exhibits tenderness (L knee). She exhibits no edema.  Lymphadenopathy:    She has no cervical adenopathy.  Neurological: She displays normal reflexes. No cranial nerve deficit. She exhibits normal muscle tone. Coordination normal.  Skin: No rash noted. No erythema.  Psychiatric: She has a normal mood and affect. Her behavior is normal. Judgment and thought content normal.  L knee w/a post-op scar Wax in R ear Lab Results  Component Value Date   WBC 6.8 04/10/2012   HGB 12.2 04/10/2012   HCT 36.9  04/10/2012   PLT 245.0 04/10/2012   GLUCOSE 86 04/10/2012   CHOL 172 03/23/2011   TRIG 78.0 03/23/2011   HDL 63.80 03/23/2011   LDLCALC 93 03/23/2011   ALT 14 10/25/2011   AST 22 10/25/2011   NA 140 04/10/2012   K 4.5 04/10/2012   CL 104 04/10/2012   CREATININE 0.9 04/10/2012   BUN 19 04/10/2012   CO2 31 04/10/2012   TSH 1.81 10-06-2011   INR 0.95 10/25/2011   HGBA1C 5.6 09/08/2009   MICROALBUR 0.6 03/23/2011        Assessment & Plan:

## 2012-08-07 NOTE — Assessment & Plan Note (Signed)
F/u w/Dr Sable Feil

## 2012-08-08 DIAGNOSIS — L0889 Other specified local infections of the skin and subcutaneous tissue: Secondary | ICD-10-CM | POA: Diagnosis not present

## 2012-08-13 ENCOUNTER — Encounter: Payer: Self-pay | Admitting: Internal Medicine

## 2012-08-20 ENCOUNTER — Other Ambulatory Visit: Payer: Self-pay | Admitting: Internal Medicine

## 2012-09-19 DIAGNOSIS — D0439 Carcinoma in situ of skin of other parts of face: Secondary | ICD-10-CM | POA: Diagnosis not present

## 2012-09-19 DIAGNOSIS — L570 Actinic keratosis: Secondary | ICD-10-CM | POA: Diagnosis not present

## 2012-09-19 DIAGNOSIS — D485 Neoplasm of uncertain behavior of skin: Secondary | ICD-10-CM | POA: Diagnosis not present

## 2012-09-19 DIAGNOSIS — D0470 Carcinoma in situ of skin of unspecified lower limb, including hip: Secondary | ICD-10-CM | POA: Diagnosis not present

## 2012-10-01 ENCOUNTER — Ambulatory Visit (INDEPENDENT_AMBULATORY_CARE_PROVIDER_SITE_OTHER): Payer: Medicare Other | Admitting: Internal Medicine

## 2012-10-01 ENCOUNTER — Encounter: Payer: Self-pay | Admitting: Internal Medicine

## 2012-10-01 VITALS — BP 170/80 | HR 76 | Temp 97.6°F | Resp 16 | Wt 142.0 lb

## 2012-10-01 DIAGNOSIS — R197 Diarrhea, unspecified: Secondary | ICD-10-CM | POA: Insufficient documentation

## 2012-10-01 DIAGNOSIS — I10 Essential (primary) hypertension: Secondary | ICD-10-CM | POA: Diagnosis not present

## 2012-10-01 MED ORDER — DIPHENOXYLATE-ATROPINE 2.5-0.025 MG PO TABS: 1.0000 | ORAL_TABLET | Freq: Four times a day (QID) | ORAL | Status: DC | PRN

## 2012-10-01 NOTE — Assessment & Plan Note (Addendum)
Lomotil Pepto-bismol and probiotic

## 2012-10-01 NOTE — Patient Instructions (Addendum)
Pepto-bismol Probiotic

## 2012-10-01 NOTE — Progress Notes (Signed)
Patient ID: Mia Contreras, female   DOB: 01-21-1929, 79 y.o.   MRN: [id number]   Subjective:    Patient ID: Mia Contreras, female    DOB: [birth year], 79 y.o.   MRN: [id number]  Diarrhea  This is a new problem. The current episode started 1 to 4 weeks ago. The problem occurs 5 to 10 times per day. The problem has been unchanged. The stool consistency is described as watery. Associated symptoms include chills. Pertinent negatives include no arthralgias. Nothing aggravates the symptoms. She has tried nothing for the symptoms. The treatment provided no relief.    C/o fatigue, not sleeping well; a new URI. She is flying to [facility] soon  She had L TKR in 3/13 - doing well  The patient presents for a follow-up of  chronic hypertension, chronic dyslipidemia, knee OA - not controlled with medicines. No wt loss   Wt Readings from Last 3 Encounters:  10/01/12 142 lb (64.411 kg)  08/07/12 140 lb (63.504 kg)  08/07/12 140 lb (63.504 kg)   BP Readings from Last 3 Encounters:  10/01/12 170/80  08/07/12 160/78  08/07/12 140/70      Past Medical History  Diagnosis Date  . GOITER, MULTINODULAR 05/06/2010    Dr [name]  . HYPERTHYROIDISM 05/23/2010  . HYPERLIPIDEMIA 08/20/2007  . ANEMIA-NOS 08/20/2007  . ANXIETY 03/16/2007  . CARPAL TUNNEL SYNDROME, RIGHT 01/11/2008  . HYPERTENSION 03/16/2007  . VENOUS INSUFFICIENCY 09/05/2007  . DIVERTICULOSIS, COLON 08/20/2007  . CELLULITIS, LEG, RIGHT 08/20/2007  . OSTEOARTHRITIS 03/16/2007  . OSTEOPOROSIS 08/20/2007  . SCOLIOSIS 01/11/2008  . BREAST CANCER, HX OF 03/16/2007  . SKIN CANCER, HX OF 11/12/2008     R cheek 2010, L Cheek 2011 Dr. Tonia Brooms  . PREMATURE ATRIAL CONTRACTIONS 09/21/2010  . Thyroid nodule   . Cataract     floaters  . Cancer     left breaast  . Pneumonia 07/2011    took abx for several weeks   Past Surgical History  Procedure Laterality Date  . Total knee arthroplasty    . Breast lumpectomy  2001  . Abdominal hysterectomy    .  Appendectomy  1972  . Mastectomy, partial      s/p left  . Carpal tunnel release    . Total knee arthroplasty  2010    Right-Dr Para March  . Cataract extraction    . Skin cancer excision      face-Dr. Bing Plume  . Xrt      chemo, surgery  . Eye surgery      cataract ext  . Total knee arthroplasty  10/30/2011    Procedure: TOTAL KNEE ARTHROPLASTY;  Surgeon: Lorn Junes, MD;  Location: [place];  Service: Orthopedics;  Laterality: Left;   Dr Noemi Chapel would like 90 mins for his Total Cases    reports that she quit smoking about 31 years ago. She has never used smokeless tobacco. She reports that she does not drink alcohol or use illicit drugs. family history includes Alcohol abuse in her father; Alzheimer's disease in her mother; Cancer in her brother; Dementia in her mother; Diabetes in her other; Heart attack in her father; Heart disease in her mother; Hypertension in her mother; Mental illness in her mother; and Mental retardation in her mother.  There is no history of Anesthesia problems. Allergies  Allergen Reactions  . Cefuroxime Diarrhea  . Celecoxib Nausea Only  . Codeine Nausea Only  . Ranitidine     bloating  . Tramadol Hcl  Nausea Only   Current Outpatient Prescriptions on File Prior to Visit  Medication Sig Dispense Refill  . ALPRAZolam (XANAX) 0.25 MG tablet Take 1-2 tablets (0.25-0.5 mg total) by mouth 3 (three) times daily as needed. For anxiety  90 tablet  3  . aspirin 325 MG tablet Take 325 mg by mouth daily.      . Calcium Carbonate-Vitamin D (CALCIUM 600+D) 600-400 MG-UNIT per tablet Take 1 tablet by mouth daily.      . carvedilol (COREG) 12.5 MG tablet TAKE 1 TABLET TWICE A DAY  180 tablet  3  . ferrous sulfate 325 (65 FE) MG tablet Take 325 mg by mouth daily with breakfast.      . Fluticasone-Salmeterol (ADVAIR DISKUS) 250-50 MCG/DOSE AEPB Inhale 1 puff into the lungs 2 (two) times daily.  1 each  3  . losartan (COZAAR) 100 MG tablet TAKE 1 TABLET BY MOUTH EVERY DAY  30  tablet  11  . Multiple Vitamins-Minerals (MULTIVITAMIN PO) Take 1 each by mouth daily.      Marland Kitchen doxycycline (VIBRA-TABS) 100 MG tablet Take 1 tablet (100 mg total) by mouth 2 (two) times daily.  20 tablet  0  . nortriptyline (PAMELOR) 10 MG capsule Take 1-2 capsules (10-20 mg total) by mouth at bedtime. 5-7 pm  60 capsule  5   No current facility-administered medications on file prior to visit.   BP 170/80  Pulse 76  Temp(Src) 97.6 F (36.4 C) (Oral)  Resp 16  Wt 142 lb (64.411 kg)  BMI 25.16 kg/m2   Review of Systems  Constitutional: Positive for chills. Negative for activity change, appetite change, fatigue and unexpected weight change.  HENT: Positive for congestion. Negative for mouth sores and sinus pressure.   Eyes: Negative for visual disturbance.  Respiratory: Negative for chest tightness.   Gastrointestinal: Positive for diarrhea. Negative for nausea.  Genitourinary: Negative for frequency, difficulty urinating and vaginal pain.  Musculoskeletal: Positive for joint swelling (L knee hurts) and gait problem. Negative for back pain and arthralgias.  Skin: Negative for pallor.  Neurological: Negative for dizziness, tremors, weakness and numbness.  Psychiatric/Behavioral: Negative for suicidal ideas, confusion and sleep disturbance. The patient is not nervous/anxious.        Objective:   Physical Exam  Constitutional: She appears well-developed and well-nourished. No distress.  HENT:  Head: Normocephalic.  Right Ear: External ear normal.  Left Ear: External ear normal.  Nose: Nose normal.  Nasal mucosa is swollen  Eyes: Conjunctivae are normal. Pupils are equal, round, and reactive to light. Right eye exhibits no discharge. Left eye exhibits no discharge.  Neck: Normal range of motion. Neck supple. No JVD present. No tracheal deviation present. No thyromegaly present.  Cardiovascular: Normal rate, regular rhythm and normal heart sounds.   Pulmonary/Chest: No stridor. No  respiratory distress. She has no wheezes.  Abdominal: Soft. Bowel sounds are normal. She exhibits no distension and no mass. There is no tenderness. There is no rebound and no guarding.  Musculoskeletal: She exhibits tenderness (L knee). She exhibits no edema.  Lymphadenopathy:    She has no cervical adenopathy.  Neurological: She displays normal reflexes. No cranial nerve deficit. She exhibits normal muscle tone. Coordination normal.  Skin: No rash noted. No erythema.  Psychiatric: She has a normal mood and affect. Her behavior is normal. Judgment and thought content normal.  L knee w/a post-op scar Wax in R ear Lab Results  Component Value Date   WBC 6.3 08/07/2012  HGB 12.2 08/07/2012   HCT 36.7 08/07/2012   PLT 341.0 08/07/2012   GLUCOSE 80 08/07/2012   CHOL 172 03/23/2011   TRIG 78.0 03/23/2011   HDL 63.80 03/23/2011   LDLCALC 93 03/23/2011   ALT 23 08/07/2012   AST 30 08/07/2012   NA 142 08/07/2012   K 4.0 08/07/2012   CL 105 08/07/2012   CREATININE 1.0 08/07/2012   BUN 20 08/07/2012   CO2 29 08/07/2012   TSH 1.47 08/07/2012   INR 0.95 10/25/2011   HGBA1C 5.6 09/08/2009   MICROALBUR 0.6 03/23/2011        Assessment & Plan:

## 2012-10-01 NOTE — Assessment & Plan Note (Signed)
Continue with current prescription therapy as reflected on the Med list.  

## 2012-10-10 ENCOUNTER — Encounter: Payer: Self-pay | Admitting: Internal Medicine

## 2012-10-10 ENCOUNTER — Ambulatory Visit (INDEPENDENT_AMBULATORY_CARE_PROVIDER_SITE_OTHER): Payer: Medicare Other | Admitting: Internal Medicine

## 2012-10-10 VITALS — BP 150/88 | HR 80 | Temp 97.6°F | Resp 16 | Wt 139.0 lb

## 2012-10-10 MED ORDER — METRONIDAZOLE 250 MG PO TABS: 250.0000 mg | ORAL_TABLET | Freq: Four times a day (QID) | ORAL | Status: DC

## 2012-10-10 NOTE — Progress Notes (Signed)
Subjective:    Diarrhea  The current episode started more than 1 month ago. The problem occurs 5 to 10 times per day. The problem has been unchanged. The stool consistency is described as watery. Associated symptoms include chills. Pertinent negatives include no arthralgias. Nothing aggravates the symptoms. She has tried anti-motility drug, bismuth subsalicylate and change of diet for the symptoms. The treatment provided no relief. There is no history of irritable bowel syndrome.  No recent abx  C/o fatigue, not sleeping well  She had L TKR in 3/13 - doing well  The patient presents for a follow-up of  chronic hypertension, chronic dyslipidemia, knee OA - not controlled with medicines. No wt loss   Wt Readings from Last 3 Encounters:  10/10/12 139 lb (63.05 kg)  10/01/12 142 lb (64.411 kg)  08/07/12 140 lb (63.504 kg)   BP Readings from Last 3 Encounters:  10/10/12 150/88  10/01/12 170/80  08/07/12 160/78      Past Medical History  Diagnosis Date  . GOITER, MULTINODULAR 05/06/2010    Dr [name]  . HYPERTHYROIDISM 05/23/2010  . HYPERLIPIDEMIA 08/20/2007  . ANEMIA-NOS 08/20/2007  . ANXIETY 03/16/2007  . CARPAL TUNNEL SYNDROME, RIGHT 01/11/2008  . HYPERTENSION 03/16/2007  . VENOUS INSUFFICIENCY 09/05/2007  . DIVERTICULOSIS, COLON 08/20/2007  . CELLULITIS, LEG, RIGHT 08/20/2007  . OSTEOARTHRITIS 03/16/2007  . OSTEOPOROSIS 08/20/2007  . SCOLIOSIS 01/11/2008  . BREAST CANCER, HX OF 03/16/2007  . SKIN CANCER, HX OF 11/12/2008     R cheek 2010, L Cheek 2011 Dr. Tonia Brooms  . PREMATURE ATRIAL CONTRACTIONS 09/21/2010  . Thyroid nodule   . Cataract     floaters  . Cancer     left breaast  . Pneumonia 07/2011    took abx for several weeks   Past Surgical History  Procedure Laterality Date  . Total knee arthroplasty    . Breast lumpectomy  2001  . Abdominal hysterectomy    . Appendectomy  1972  . Mastectomy, partial      s/p left  . Carpal tunnel release    . Total knee  arthroplasty  2010    Right-Dr Para March  . Cataract extraction    . Skin cancer excision      face-Dr. Bing Plume  . Xrt      chemo, surgery  . Eye surgery      cataract ext  . Total knee arthroplasty  10/30/2011    Procedure: TOTAL KNEE ARTHROPLASTY;  Surgeon: Lorn Junes, MD;  Location: [place];  Service: Orthopedics;  Laterality: Left;   Dr Noemi Chapel would like 90 mins for his Total Cases    reports that she quit smoking about 31 years ago. She has never used smokeless tobacco. She reports that she does not drink alcohol or use illicit drugs. family history includes Alcohol abuse in her father; Alzheimer's disease in her mother; Cancer in her brother; Dementia in her mother; Diabetes in her other; Heart attack in her father; Heart disease in her mother; Hypertension in her mother; Mental illness in her mother; and Mental retardation in her mother.  There is no history of Anesthesia problems. Allergies  Allergen Reactions  . Cefuroxime Diarrhea  . Celecoxib Nausea Only  . Codeine Nausea Only  . Ranitidine     bloating  . Tramadol Hcl Nausea Only   Current Outpatient Prescriptions on File Prior to Visit  Medication Sig Dispense Refill  . ALPRAZolam (XANAX) 0.25 MG tablet Take 1-2 tablets (0.25-0.5 mg total) by mouth  3 (three) times daily as needed. For anxiety  90 tablet  3  . aspirin 325 MG tablet Take 325 mg by mouth daily.      . Calcium Carbonate-Vitamin D (CALCIUM 600+D) 600-400 MG-UNIT per tablet Take 1 tablet by mouth daily.      . carvedilol (COREG) 12.5 MG tablet TAKE 1 TABLET TWICE A DAY  180 tablet  3  . diphenoxylate-atropine (LOMOTIL) 2.5-0.025 MG per tablet Take 1 tablet by mouth 4 (four) times daily as needed for diarrhea or loose stools.  60 tablet  1  . Fluticasone-Salmeterol (ADVAIR DISKUS) 250-50 MCG/DOSE AEPB Inhale 1 puff into the lungs 2 (two) times daily.  1 each  3  . losartan (COZAAR) 100 MG tablet TAKE 1 TABLET BY MOUTH EVERY DAY  30 tablet  11  . Multiple  Vitamins-Minerals (MULTIVITAMIN PO) Take 1 each by mouth daily.      Marland Kitchen doxycycline (VIBRA-TABS) 100 MG tablet Take 1 tablet (100 mg total) by mouth 2 (two) times daily.  20 tablet  0  . ferrous sulfate 325 (65 FE) MG tablet Take 325 mg by mouth daily with breakfast.      . nortriptyline (PAMELOR) 10 MG capsule Take 1-2 capsules (10-20 mg total) by mouth at bedtime. 5-7 pm  60 capsule  5   No current facility-administered medications on file prior to visit.   BP 150/88  Pulse 80  Temp(Src) 97.6 F (36.4 C) (Oral)  Resp 16  Wt 139 lb (63.05 kg)  BMI 24.63 kg/m2   Review of Systems  Constitutional: Positive for chills. Negative for activity change, appetite change, fatigue and unexpected weight change.  HENT: Positive for congestion. Negative for mouth sores and sinus pressure.   Eyes: Negative for visual disturbance.  Respiratory: Negative for chest tightness.   Gastrointestinal: Positive for diarrhea. Negative for nausea.  Genitourinary: Negative for frequency, difficulty urinating and vaginal pain.  Musculoskeletal: Positive for joint swelling (L knee hurts) and gait problem. Negative for back pain and arthralgias.  Skin: Negative for pallor.  Neurological: Negative for dizziness, tremors, weakness and numbness.  Psychiatric/Behavioral: Negative for suicidal ideas, confusion and sleep disturbance. The patient is not nervous/anxious.        Objective:   Physical Exam  Constitutional: She appears well-developed and well-nourished. No distress.  HENT:  Head: Normocephalic.  Right Ear: External ear normal.  Left Ear: External ear normal.  Nose: Nose normal.  Nasal mucosa is swollen  Eyes: Conjunctivae are normal. Pupils are equal, round, and reactive to light. Right eye exhibits no discharge. Left eye exhibits no discharge.  Neck: Normal range of motion. Neck supple. No JVD present. No tracheal deviation present. No thyromegaly present.  Cardiovascular: Normal rate, regular  rhythm and normal heart sounds.   Pulmonary/Chest: No stridor. No respiratory distress. She has no wheezes.  Abdominal: Soft. Bowel sounds are normal. She exhibits no distension and no mass. There is no tenderness. There is no rebound and no guarding.  Musculoskeletal: She exhibits tenderness (L knee). She exhibits no edema.  Lymphadenopathy:    She has no cervical adenopathy.  Neurological: She displays normal reflexes. No cranial nerve deficit. She exhibits normal muscle tone. Coordination normal.  Skin: No rash noted. No erythema.  Psychiatric: She has a normal mood and affect. Her behavior is normal. Judgment and thought content normal.  L knee w/a post-op scar Wax in R ear Lab Results  Component Value Date   WBC 6.3 08/07/2012   HGB 12.2 08/07/2012  HCT 36.7 08/07/2012   PLT 341.0 08/07/2012   GLUCOSE 80 08/07/2012   CHOL 172 03/23/2011   TRIG 78.0 03/23/2011   HDL 63.80 03/23/2011   LDLCALC 93 03/23/2011   ALT 23 08/07/2012   AST 30 08/07/2012   NA 142 08/07/2012   K 4.0 08/07/2012   CL 105 08/07/2012   CREATININE 1.0 08/07/2012   BUN 20 08/07/2012   CO2 29 08/07/2012   TSH 1.47 08/07/2012   INR 0.95 10/25/2011   HGBA1C 5.6 09/08/2009   MICROALBUR 0.6 03/23/2011        Assessment & Plan:

## 2012-10-10 NOTE — Assessment & Plan Note (Addendum)
2/14 acute; not better w/initial Rx Stool tests Start Flagyl GI consult if needed

## 2012-10-11 ENCOUNTER — Ambulatory Visit: Payer: Medicare Other

## 2012-10-12 LAB — FECAL LACTOFERRIN, QUANT: Lactoferrin: POSITIVE

## 2012-10-13 NOTE — Assessment & Plan Note (Signed)
Continue with current prescription therapy as reflected on the Med list.  

## 2012-10-14 LAB — GIARDIA/CRYPTOSPORIDIUM (EIA): Cryptosporidium Screen (EIA): NEGATIVE

## 2012-10-16 ENCOUNTER — Telehealth: Payer: Self-pay

## 2012-10-16 NOTE — Telephone Encounter (Signed)
Pt advised and will expect a call from [facility] with appt date and time.

## 2012-10-16 NOTE — Telephone Encounter (Signed)
Pt called stating that there has been no improvement in diarrhea since starting ABX at last OV. Pt is requesting advisement from MD, should she make ROV with PCP or can GI Consult be initiated?

## 2012-10-16 NOTE — Telephone Encounter (Signed)
GI ref (done) Thx

## 2012-10-18 LAB — CLOSTRIDIUM DIFFICILE EIA: CDIFTX: NEGATIVE

## 2012-10-22 ENCOUNTER — Encounter: Payer: Self-pay | Admitting: Gastroenterology

## 2012-10-22 ENCOUNTER — Telehealth: Payer: Self-pay | Admitting: *Deleted

## 2012-10-22 NOTE — Telephone Encounter (Signed)
I called pt and informed her of her results. She still c/o diarrhea. She only took Abx x 5 days. Was that enough? Should she take more. She is scheduled to see GI next Wednesday. Please advise.

## 2012-10-22 NOTE — Telephone Encounter (Signed)
Take abx x 10 d s prescribed Thx

## 2012-10-22 NOTE — Telephone Encounter (Signed)
Pt informed

## 2012-10-23 ENCOUNTER — Encounter: Payer: Self-pay | Admitting: *Deleted

## 2012-10-30 ENCOUNTER — Ambulatory Visit (INDEPENDENT_AMBULATORY_CARE_PROVIDER_SITE_OTHER): Payer: Medicare Other | Admitting: Gastroenterology

## 2012-10-30 ENCOUNTER — Other Ambulatory Visit: Payer: Medicare Other

## 2012-10-30 ENCOUNTER — Encounter: Payer: Self-pay | Admitting: Gastroenterology

## 2012-10-30 VITALS — BP 146/70 | HR 67 | Ht 63.0 in | Wt 141.0 lb

## 2012-10-30 DIAGNOSIS — R197 Diarrhea, unspecified: Secondary | ICD-10-CM | POA: Diagnosis not present

## 2012-10-30 MED ORDER — LOPERAMIDE HCL 2 MG PO TABS: 2.0000 mg | ORAL_TABLET | ORAL | Status: DC | PRN

## 2012-10-30 NOTE — Patient Instructions (Addendum)
You have been scheduled for a flexible sigmoidoscopy. Please follow the written instructions given to you at your visit today. If you use inhalers (even only as needed), please bring them with you on the day of your procedure.  Please follow low fiber diet below.  Please take Imodium as needed.  Your physician has requested that you go to the basement for the following stool studies before leaving today: Stool Culture.  _____________________________________________________________________________________________________________________   Low-Fiber Diet Fiber is found in fruits, vegetables, and grains. A low-fiber diet restricts fibrous foods that are not digested in the small intestine. A diet containing about 10 grams of fiber is considered low fiber.  PURPOSE  To prevent blockage of a partially obstructed or narrowed gastrointestinal tract.  To reduce fecal weight and volume.  To slow the movement of feces. WHEN IS THIS DIET USED?  It may be used during the acute phase of Crohn disease, ulcerative colitis, regional enteritis, or diverticulitis.  It may be used if your intestinal or esophageal tubes are narrowing (stenosis).  It may be used as a transitional diet following surgery, injury (trauma), or illness. CHOOSING FOODS Check labels, especially on foods from the starch list. Often times, dietary fiber content is listed on the nutrition facts panel. Please ask your Registered Dietitian if you have questions about specific foods that are related to your condition, especially if the food is not listed on this handout. Breads and Starches  Allowed: White, [place], and pita breads, plain rolls, buns, or sweet rolls, doughnuts, waffles, pancakes, bagels. Plain muffins, biscuits, matzoth. Soda, saltine, graham crackers. Pretzels, rusks, melba toast, zwieback. Cooked cereals: cornmeal, farina, or cream cereals. Dry cereals: refined corn, wheat, rice, and oat cereals (check label).  Potatoes prepared any way without skins, refined macaroni, spaghetti, noodles, refined rice.  Avoid: Whole-wheat bread, rolls, and crackers. Multigrains, rye, bran seeds, nuts, or coconut. Cereals containing whole grains, multigrains, bran, coconut, nuts, raisins. Cooked or dry oatmeal. Coarse wheat cereals, granola. Cereals advertised as "high fiber." Potato skins. Whole-grain pasta, wild or brown rice. Popcorn. Vegetables  Allowed: Strained tomato and vegetable juices. Fresh lettuce, cucumber, spinach. Well-cooked or canned: asparagus, bean sprouts, broccoli, cut green beans, cauliflower, pumpkin, beets, mushrooms, olives, yellow squash, tomato, tomato sauce, zucchini, turnips.Keep servings limited to  cup.  Avoid: Fresh, cooked, or canned: artichokes, baked beans, beet greens, Brussels sprouts, corn, kale, legumes, peas, sweet potatoes. Avoid large servings of any vegetables. Fruit  Allowed: All fruit juices except prune juice. Cooked or canned fruits without skin and seeds: apricots, applesauce, cantaloupe, cherries, grapefruit, grapes, kiwi, mandarin oranges, peaches, pears, fruit cocktail, pineapple, plums, watermelon. Fresh without skin: banana, grapes, cantaloupe, avocado, cherries, pineapple, kiwi, nectarines, peaches, blueberries. Keep servings limited to  cup or 1 piece.  Avoid: Fresh: apples with or without skin, apricots, mangoes, pears, raspberries, strawberries. Prune juice and juices with pulp, stewed or dried prunes. Dried fruits, raisins, dates. Avoid large servings of all fresh fruits. Meat and Protein Substitutes  Allowed: Ground or well-cooked tender beef, ham, veal, lamb, pork, poultry. Eggs, plain cheese. Fish, oysters, shrimp, lobster, other seafood. Liver, organ meats. Smooth nut butters.  Avoid: Tough, fibrous meats with gristle. Chunky nut butter.Cheese with seeds, nuts, or other foods not allowed. Nuts, seeds, legumes, dried peas, beans, lentils. Dairy  Allowed:  All milk products except those not allowed.  Avoid: Yogurt or cheese that contains nuts, seeds, or added fruit. Soups and Combination Foods  Allowed: Bouillon, broth, or cream soups made from allowed foods.  Any strained soup. Casseroles or mixed dishes made with allowed foods.  Avoid: Soups made from vegetables that are not allowed or that contain other foods not allowed. Desserts and Sweets  Allowed:Plain cakes and cookies, pie made with allowed fruit, pudding, custard, cream pie. Gelatin, fruit, ice, sherbet, frozen ice pops. Ice cream, ice milk without nuts. Plain hard candy, honey, jelly, molasses, syrup, sugar, chocolate syrup, gumdrops, marshmallows.  Avoid: Desserts, cookies, or candies that contain nuts, peanut butter, dried fruits. Jams, preserves with seeds, marmalade. Fats and Oils  Allowed:Margarine, butter, cream, mayonnaise, salad oils, plain salad dressings made from allowed foods.  Avoid: Seeds, nuts, olives. Beverages  Allowed: All, except those listed to avoid.  Avoid: Fruit juices with high pulp, prune juice. Condiments  Allowed:Ketchup, mustard, horseradish, vinegar, cream sauce, cheese sauce, cocoa powder. Spices in moderation: allspice, basil, bay leaves, celery powder or leaves, cinnamon, cumin powder, curry powder, ginger, mace, marjoram, onion or garlic powder, oregano, paprika, parsley flakes, ground pepper, rosemary, sage, savory, tarragon, thyme, turmeric.  Avoid: Coconut, pickles. SAMPLE MENU Breakfast   cup orange juice.  1 boiled egg.  1 slice white toast.  Margarine.   cup cornflakes.  1 cup milk.  Beverage. Lunch   cup chicken noodle soup.  2 to 3 oz sliced roast beef.  2 slices white bread.  Mayonnaise.   cup tomato juice.  1 small banana.  Beverage. Dinner  3 oz baked chicken.   cup scalloped potatoes.   cup cooked beets.  White dinner roll.  Margarine.   cup canned peaches.  Beverage. Document  Released: 01/27/2002 Document Revised: 10/30/2011 Document Reviewed: 08/24/2011 ExitCare Patient Information 2013 [employer]. ____________________________________________________________________________________________________________________                                               We are excited to introduce MyChart, a new best-in-class service that provides you online access to important information in your electronic medical record. We want to make it easier for you to view your health information - all in one secure location - when and where you need it. We expect MyChart will enhance the quality of care and service we provide.  When you register for MyChart, you can:    View your test results.    Request appointments and receive appointment reminders via email.    Request medication renewals.    View your medical history, allergies, medications and immunizations.    Communicate with your physician's office through a password-protected site.    Conveniently print information such as your medication lists.  To find out if MyChart is right for you, please talk to a member of our clinical staff today. We will gladly answer your questions about this free health and wellness tool.  If you are age 47 or older and want a member of your family to have access to your record, you must provide written consent by completing a proxy form available at our office. Please speak to our clinical staff about guidelines regarding accounts for patients younger than age 64.  As you activate your MyChart account and need any technical assistance, please call the MyChart technical support line at [phone]) or email your question to [email or web address]. If you email your question(s), please include your name, a return phone number and the best time to reach you.  If you have non-urgent health-related  questions, you can send a message to our office through [place] at  [email or web address]. If you have a medical emergency, call 911.  Thank you for using MyChart as your new health and wellness resource!   MyChart licensed from [employer],  1999-2010. Patents Pending.

## 2012-10-30 NOTE — Progress Notes (Signed)
History of Present Illness:  This is a very verbose 79 year old Caucasian female with 6 weeks of watery diarrhea of unexplained etiology.  She has minimal abdominal rectal spasms, and has a small volume nonbloody diarrhea unresponsive to 2 weeks of metronidazole 500 mg twice a day.  Stool ova and parasite and exam for C. difficile has been negative.  Laboratory data otherwise is been unremarkable.  She has not tried Imodium or Lomotil.  The patient denies antibiotic use over the last year, but was treated for pneumonia one year ago.  She's had no anorexia, weight loss, fever, chills, or systemic, upper GI or hepatobiliary complaints.  She is on aspirin 325 mg a day and Cozaar 100 mg a day and Coreg 12.5 mg twice a day.  I have reviewed this patient's present history, medical and surgical past history, allergies and medications.     ROS:   All systems were reviewed and are negative unless otherwise stated in the HPI.    Physical Exam: Awake and alert no acute distress.  I cannot appreciate stigmata of chronic liver disease.  Blood pressure 146/70, pulse 67 and regular, and weight 141 with a BMI of 24.98.  97% oxygen saturation on room air. General well developed well nourished patient in no acute distress, appearing their stated age Eyes PERRLA, no icterus, fundoscopic exam per opthamologist Skin no lesions noted Neck supple, no adenopathy, no thyroid enlargement, no tenderness Chest clear to percussion and auscultation Heart no significant murmurs, gallops or rubs noted Abdomen no hepatosplenomegaly masses or tenderness, BS normal.  Rectal inspection normal no fissures, or fistulae noted.  No masses or tenderness on digital exam. Stool guaiac negative. Extremities no acute joint lesions, edema, phlebitis or evidence of cellulitis. Neurologic patient oriented x 3, cranial nerves intact, no focal neurologic deficits noted. Psychological mental status normal and normal affect.  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  I've scheduled her for flexible sigmoidoscopy exam, we'll check stool retained culture, and have asked to use a low fiber diet with Imodium twice a day as tolerated.  She's had an excellent workup by Dr. Alain Marion in internal medicine.  Encounter Diagnosis  Name Primary?  . Diarrhea Yes

## 2012-10-31 ENCOUNTER — Other Ambulatory Visit: Payer: Self-pay | Admitting: Internal Medicine

## 2012-10-31 DIAGNOSIS — M1710 Unilateral primary osteoarthritis, unspecified knee: Secondary | ICD-10-CM | POA: Diagnosis not present

## 2012-11-01 ENCOUNTER — Ambulatory Visit (AMBULATORY_SURGERY_CENTER): Payer: Medicare Other | Admitting: Gastroenterology

## 2012-11-01 ENCOUNTER — Encounter: Payer: Self-pay | Admitting: Gastroenterology

## 2012-11-01 VITALS — BP 178/73 | HR 59 | Temp 96.8°F | Resp 24 | Ht 63.0 in | Wt 141.0 lb

## 2012-11-01 DIAGNOSIS — R197 Diarrhea, unspecified: Secondary | ICD-10-CM

## 2012-11-01 DIAGNOSIS — K5289 Other specified noninfective gastroenteritis and colitis: Secondary | ICD-10-CM

## 2012-11-01 DIAGNOSIS — J449 Chronic obstructive pulmonary disease, unspecified: Secondary | ICD-10-CM | POA: Diagnosis not present

## 2012-11-01 MED ORDER — SODIUM CHLORIDE 0.9 % IV SOLN: 500.0000 mL | INTRAVENOUS | Status: DC

## 2012-11-01 NOTE — Progress Notes (Signed)
Patient did not experience any of the following events: a burn prior to discharge; a fall within the facility; wrong site/side/patient/procedure/implant event; or a hospital transfer or hospital admission upon discharge from the facility. (G8907) Patient did not have preoperative order for IV antibiotic SSI prophylaxis. (G8918)  

## 2012-11-01 NOTE — Op Note (Signed)
[place]  [address] [place], [address]   FLEXIBLE SIGMOIDOSCOPY PROCEDURE REPORT  PATIENT: Mia Contreras, Mia Contreras.  MR#: [id number] BIRTHDATE: 16-Jan-1929 , 83  yrs. old GENDER: Female ENDOSCOPIST: Sable Feil, MD, [facility] REFERRED BY: Creig Hines, M.D. PROCEDURE DATE:  11/01/2012 PROCEDURE:   Sigmoidoscopy with biopsy ASA CLASS:   Class II INDICATIONS:chronic diarrhea.   unexplained diarrhea. MEDICATIONS: Propofol (Diprivan) 90 mg IV  DESCRIPTION OF PROCEDURE:   After the risks benefits and alternatives of the procedure were thoroughly explained, informed consent was obtained.  revealed no abnormalities of the rectum. The [id number]-[id number]  endoscope was introduced through the anus and advanced to the descending colon , limited by No adverse events experienced.   The quality of the prep was adequate .  The instrument was then slowly withdrawn as the mucosa was fully examined.         COLON FINDINGS: Moderate diverticulosis was noted in the descending colon and sigmoid colon.  mucosal polypoid lesions noted.  There is no evidence of pseudomembrane formation, mucosal friability, or erosions.  There is mildly extensive diverticulosis but no evidence of diverticulitis with segmental colitis. Retroflexed views revealed no abnormalities.    The scope was then withdrawn from the patient and the procedure terminated.  COMPLICATIONS: There were no complications.  ENDOSCOPIC IMPRESSION: Moderate diverticulosis was noted in the descending colon and sigmoid colon ...diarrhea probably from collagenous/microscopic colitis...  RECOMMENDATIONS: Await biopsy results.Marland KitchenMarland KitchenI've given her some samples of Uceris 9 mg a day as an enteral  steroid preparation pending results of her mucosal biopsies.  She's had extensive workup by Dr. Alain Marion which has been unremarkable.  Stool cultures, O&P, C. difficile exams have been negative.  She is to continue a low  fiber diet with Imodium every 12 hours as needed.  We'll call her next week for biopsy results and check with her per  a progress report.   REPEAT EXAM: standard discharge  _______________________________ eSigned:  Sable Feil, MD, [facility] 11/01/2012 4:23 PM   CC:

## 2012-11-01 NOTE — Progress Notes (Addendum)
Called to room to assist during endoscopic procedure.  Patient ID and intended procedure confirmed with present staff. Received instructions for my participation in the procedure from the performing physician. ewm 

## 2012-11-01 NOTE — Patient Instructions (Addendum)
YOU HAD AN ENDOSCOPIC PROCEDURE TODAY AT [facility]: Refer to the procedure report that was given to you for any specific questions about what was found during the examination.  If the procedure report does not answer your questions, please call your gastroenterologist to clarify.  If you requested that your care partner not be given the details of your procedure findings, then the procedure report has been included in a sealed envelope for you to review at your convenience later.  YOU SHOULD EXPECT: Some feelings of bloating in the abdomen. Passage of more gas than usual.  Walking can help get rid of the air that was put into your GI tract during the procedure and reduce the bloating. If you had a lower endoscopy (such as a colonoscopy or flexible sigmoidoscopy) you may notice spotting of blood in your stool or on the toilet paper. If you underwent a bowel prep for your procedure, then you may not have a normal bowel movement for a few days.  DIET: Your first meal following the procedure should be a light meal and then it is ok to progress to your normal diet.  A half-sandwich or bowl of soup is an example of a good first meal.  Heavy or fried foods are harder to digest and may make you feel nauseous or bloated.  Likewise meals heavy in dairy and vegetables can cause extra gas to form and this can also increase the bloating.  Drink plenty of fluids but you should avoid alcoholic beverages for 24 hours.  ACTIVITY: Your care partner should take you home directly after the procedure.  You should plan to take it easy, moving slowly for the rest of the day.  You can resume normal activity the day after the procedure however you should NOT DRIVE or use heavy machinery for 24 hours (because of the sedation medicines used during the test).    SYMPTOMS TO REPORT IMMEDIATELY: A gastroenterologist can be reached at any hour.  During normal business hours, 8:30 AM to 5:00 PM Monday through Friday,  call [phone].  After hours and on weekends, please call the GI answering service at [phone]  Emergency number who will take a message and have the physician on call contact you.   Following lower endoscopy (colonoscopy or flexible sigmoidoscopy):  Excessive amounts of blood in the stool  Significant tenderness or worsening of abdominal pains  Swelling of the abdomen that is new, acute  Fever of 100F or higher  Black, tarry-looking stools  FOLLOW UP: If any biopsies were taken you will be contacted by phone or by letter within the next 1-3 weeks.  Call your gastroenterologist if you have not heard about the biopsies in 3 weeks.  Our staff will call the home number listed on your records the next business day following your procedure to check on you and address any questions or concerns that you may have at that time regarding the information given to you following your procedure. This is a courtesy call and so if there is no answer at the home number and we have not heard from you through the emergency physician on call, we will assume that you have returned to your regular daily activities without incident.  SIGNATURES/CONFIDENTIALITY: You and/or your care partner have signed paperwork which will be entered into your electronic medical record.  These signatures attest to the fact that that the information above on your After Visit Summary has been reviewed and is understood.  Full responsibility of the confidentiality of this discharge information lies with you and/or your care-partner.  Handout on low fiber diet, diverticulosis sampples of uceris 9 mg a day by mouth Continue imodium every 12 hours as needed for diarrhea We will call you next week with biopsy results and any changes Dr Sharlett Iles feels you need

## 2012-11-04 ENCOUNTER — Telehealth: Payer: Self-pay | Admitting: *Deleted

## 2012-11-04 ENCOUNTER — Other Ambulatory Visit: Payer: Self-pay

## 2012-11-04 ENCOUNTER — Telehealth: Payer: Self-pay

## 2012-11-04 NOTE — Telephone Encounter (Signed)
No take med as recpmmended..call 1 care if needed,she doed notr follow any instructions I give her

## 2012-11-04 NOTE — Telephone Encounter (Signed)
Called patient and relayed information from Dr. Sharlett Iles to continue the medications prescribed. Informed the patient we are awaiting pathology results. Patient stating she has contacted her PCP regarding blood in urine. No further questions by patient.

## 2012-11-04 NOTE — Telephone Encounter (Signed)
Rf req for Alprazolam 0.25 mg 1-2 po tid prn. # 90. Last filled 09/19/12 .ok to Rf?

## 2012-11-04 NOTE — Telephone Encounter (Signed)
  Follow up Call-  Call back number 11/01/2012  Permission to leave phone message Yes     Patient questions:  Do you have a fever, pain , or abdominal swelling? no Pain Score  0 *  Have you tolerated food without any problems? yes  Have you been able to return to your normal activities? yes  Do you have any questions about your discharge instructions: Diet   no Medications  no Follow up visit  no  Do you have questions or concerns about your Care? yes  Actions: * If pain score is 4 or above: No action needed, pain <4.  Patient stating she wants Dr. Sharlett Iles to know that she had blood in her urine on Saturday, none on Sunday or Monday. Denies frequency,urgency yet does complain of mild back discomfort. Instructed the patient to call her PCP regarding this new event. Patient insistent that the hematuria was related to her procedure. After much discussion patient will call her PCP. Patient also wanted Dr. Sharlett Iles to know that her diarrhea continues even with use of Imodium and Uceris. Patient denies nausea, vomiting,fever or abdominal pain. Patient stating her tongue is still covered even after 6 days of finishing her antibiotic. Note forwarded to Dr. Sharlett Iles for any further advice for the patient.

## 2012-11-04 NOTE — Telephone Encounter (Signed)
NOTE PER RN at [facility]: Patient stating she wants Dr. Sharlett Iles to know that she had blood in her urine on Saturday, none on Sunday or Monday. Denies frequency,urgency yet does complain of mild back discomfort. Instructed the patient to call her PCP regarding this new event. Patient insistent that the hematuria was related to her procedure. After much discussion patient will call her PCP.

## 2012-11-04 NOTE — Telephone Encounter (Signed)
pls check UA Thx

## 2012-11-04 NOTE — Telephone Encounter (Signed)
Pt advised and order entered

## 2012-11-05 ENCOUNTER — Emergency Department (HOSPITAL_COMMUNITY)
Admission: EM | Admit: 2012-11-05 | Discharge: 2012-11-05 | Disposition: A | Discharge status: Home / Self Care | Payer: Medicare Other | Attending: Emergency Medicine | Admitting: Emergency Medicine

## 2012-11-05 DIAGNOSIS — Z85828 Personal history of other malignant neoplasm of skin: Secondary | ICD-10-CM | POA: Diagnosis not present

## 2012-11-05 DIAGNOSIS — Z8669 Personal history of other diseases of the nervous system and sense organs: Secondary | ICD-10-CM | POA: Insufficient documentation

## 2012-11-05 DIAGNOSIS — Z862 Personal history of diseases of the blood and blood-forming organs and certain disorders involving the immune mechanism: Secondary | ICD-10-CM | POA: Diagnosis not present

## 2012-11-05 DIAGNOSIS — R197 Diarrhea, unspecified: Secondary | ICD-10-CM | POA: Diagnosis not present

## 2012-11-05 DIAGNOSIS — Z7982 Long term (current) use of aspirin: Secondary | ICD-10-CM | POA: Insufficient documentation

## 2012-11-05 DIAGNOSIS — Z8701 Personal history of pneumonia (recurrent): Secondary | ICD-10-CM | POA: Diagnosis not present

## 2012-11-05 DIAGNOSIS — Z8719 Personal history of other diseases of the digestive system: Secondary | ICD-10-CM | POA: Diagnosis not present

## 2012-11-05 DIAGNOSIS — Z87891 Personal history of nicotine dependence: Secondary | ICD-10-CM | POA: Insufficient documentation

## 2012-11-05 DIAGNOSIS — N390 Urinary tract infection, site not specified: Secondary | ICD-10-CM | POA: Insufficient documentation

## 2012-11-05 DIAGNOSIS — Z79899 Other long term (current) drug therapy: Secondary | ICD-10-CM | POA: Insufficient documentation

## 2012-11-05 DIAGNOSIS — Z8679 Personal history of other diseases of the circulatory system: Secondary | ICD-10-CM | POA: Diagnosis not present

## 2012-11-05 DIAGNOSIS — R319 Hematuria, unspecified: Secondary | ICD-10-CM | POA: Diagnosis not present

## 2012-11-05 DIAGNOSIS — Z853 Personal history of malignant neoplasm of breast: Secondary | ICD-10-CM | POA: Insufficient documentation

## 2012-11-05 DIAGNOSIS — F411 Generalized anxiety disorder: Secondary | ICD-10-CM | POA: Diagnosis not present

## 2012-11-05 DIAGNOSIS — Z8739 Personal history of other diseases of the musculoskeletal system and connective tissue: Secondary | ICD-10-CM | POA: Insufficient documentation

## 2012-11-05 DIAGNOSIS — Z872 Personal history of diseases of the skin and subcutaneous tissue: Secondary | ICD-10-CM | POA: Insufficient documentation

## 2012-11-05 DIAGNOSIS — I10 Essential (primary) hypertension: Secondary | ICD-10-CM | POA: Insufficient documentation

## 2012-11-05 DIAGNOSIS — Z8639 Personal history of other endocrine, nutritional and metabolic disease: Secondary | ICD-10-CM | POA: Insufficient documentation

## 2012-11-05 LAB — URINALYSIS, ROUTINE W REFLEX MICROSCOPIC
Bilirubin Urine: NEGATIVE
Nitrite: NEGATIVE
Specific Gravity, Urine: 1.007 (ref 1.005–1.030)
Urobilinogen, UA: 0.2 mg/dL (ref 0.0–1.0)

## 2012-11-05 LAB — POCT I-STAT, CHEM 8
BUN: 15 mg/dL (ref 6–23)
Chloride: 105 mEq/L (ref 96–112)
Glucose, Bld: 117 mg/dL — ABNORMAL HIGH (ref 70–99)
HCT: 40 % (ref 36.0–46.0)
Potassium: 4.4 mEq/L (ref 3.5–5.1)

## 2012-11-05 LAB — URINE MICROSCOPIC-ADD ON

## 2012-11-05 MED ORDER — ALPRAZOLAM 0.25 MG PO TABS: 0.2500 mg | ORAL_TABLET | Freq: Three times a day (TID) | ORAL | Status: DC | PRN

## 2012-11-05 MED ORDER — CIPROFLOXACIN HCL 250 MG PO TABS: 250.0000 mg | ORAL_TABLET | Freq: Two times a day (BID) | ORAL | Status: DC

## 2012-11-05 NOTE — ED Provider Notes (Signed)
History     CSN: [id number]  Arrival date & time 11/05/12  0847   First MD Initiated Contact with Patient 11/05/12 [phone]      Chief Complaint  Patient presents with  . Hematuria   Pt seen with medical student, I performed history/physical/documentation    HPI Hematuria Onset - about 3 days ago Course is worsening Improved by - rest Worsened by - urination  Pt presents with hematuria for past 3 days.  She reports she is able to pass full urine stream.  No fever/vomiting.  No abdominal pain.  No weakness.  She denies dysuria.  She reports recent sigmoidoscopy but no immediate complications  She was going to see her PCP today but decided to come to the ED due to worsening hematuria   Past Medical History  Diagnosis Date  . GOITER, MULTINODULAR 05/06/2010    Dr [name]  . HYPERTHYROIDISM 05/23/2010  . HYPERLIPIDEMIA 08/20/2007  . ANEMIA-NOS 08/20/2007  . ANXIETY 03/16/2007  . CARPAL TUNNEL SYNDROME, RIGHT 01/11/2008  . HYPERTENSION 03/16/2007  . VENOUS INSUFFICIENCY 09/05/2007  . DIVERTICULOSIS, COLON 08/20/2007  . CELLULITIS, LEG, RIGHT 08/20/2007  . OSTEOARTHRITIS 03/16/2007  . OSTEOPOROSIS 08/20/2007  . SCOLIOSIS 01/11/2008  . BREAST CANCER, HX OF 03/16/2007  . SKIN CANCER, HX OF 11/12/2008     R cheek 2010, L Cheek 2011 Dr. Tonia Brooms  . PREMATURE ATRIAL CONTRACTIONS 09/21/2010  . Thyroid nodule   . Cataract     floaters  . Pneumonia 07/2011    took abx for several weeks  . Diverticulosis of colon (without mention of hemorrhage)   . IBS (irritable bowel syndrome)     Past Surgical History  Procedure Laterality Date  . Total knee arthroplasty    . Breast lumpectomy Left 2001  . Abdominal hysterectomy    . Appendectomy  1972  . Mastectomy, partial Left   . Carpal tunnel release    . Total knee arthroplasty Right 2010    Dr Para March  . Cataract extraction    . Skin cancer excision      face-Dr. Bing Plume  . Xrt      chemo, surgery  . Eye surgery      cataract ext  .  Total knee arthroplasty  10/30/2011    Procedure: TOTAL KNEE ARTHROPLASTY;  Surgeon: Lorn Junes, MD;  Location: [facility];  Service: Orthopedics;  Laterality: Left;     . Hernia repair      Family History  Problem Relation Age of Onset  . Dementia Mother   . Heart disease Mother   . Mental retardation Mother   . Colon cancer Neg Hx   . Hypertension Mother   . Alzheimer's disease Mother   . Prostate cancer Brother   . Diabetes Other   . Heart attack Father   . Alcohol abuse Father   . Anesthesia problems Neg Hx     History  Substance Use Topics  . Smoking status: Former Smoker    Quit date: 12/20/1980  . Smokeless tobacco: Never Used  . Alcohol Use: Yes     Comment: socially    OB History   Grav Para Term Preterm Abortions TAB SAB Ect Mult Living                  Review of Systems  Constitutional: Negative for fever.  Respiratory: Negative for shortness of breath.   Cardiovascular: Negative for chest pain.  Gastrointestinal: Positive for diarrhea. Negative for vomiting, abdominal pain and blood  in stool.  Genitourinary: Negative for dysuria.  Musculoskeletal: Negative for back pain.  Skin: Negative for color change and pallor.  Neurological: Negative for weakness.  Psychiatric/Behavioral: Negative for agitation.  All other systems reviewed and are negative.    Allergies  Cefuroxime; Celecoxib; Codeine; Ranitidine; and Tramadol hcl  Home Medications   Current Outpatient Rx  Name  Route  Sig  Dispense  Refill  . aspirin 325 MG tablet   Oral   Take 325 mg by mouth daily.         . Budesonide (UCERIS) 9 MG TB24   Oral   Take 1 tablet by mouth daily.         . Calcium Carbonate-Vitamin D (CALCIUM 600+D) 600-400 MG-UNIT per tablet   Oral   Take 1 tablet by mouth daily.         . carvedilol (COREG) 12.5 MG tablet      TAKE 1 TABLET TWICE A DAY   180 tablet   3   . Fluticasone-Salmeterol (ADVAIR DISKUS) 250-50 MCG/DOSE AEPB   Inhalation    Inhale 1 puff into the lungs 2 (two) times daily.   1 each   3   . loperamide (IMODIUM) 1 MG/5ML solution   Oral   Take 3 mg by mouth 4 (four) times daily as needed for diarrhea or loose stools.         . Loperamide HCl (IMODIUM PO)   Oral   Take 15 mLs by mouth 2 (two) times daily as needed. For diarrhea         . losartan (COZAAR) 100 MG tablet      TAKE 1 TABLET BY MOUTH EVERY DAY   30 tablet   11     Rx sent 06/27/2012 #90 with 3 refills. Pt has refi ...   . Multiple Vitamins-Minerals (MULTIVITAMIN PO)   Oral   Take 1 each by mouth daily.         Marland Kitchen ALPRAZolam (XANAX) 0.25 MG tablet      TAKE 1 TO 2 TABLETS 3 TIMES A DAY AS NEEDED FOR ANXIETY   90 tablet   3   . loperamide (IMODIUM A-D) 2 MG tablet   Oral   Take 1 tablet (2 mg total) by mouth as needed for diarrhea or loose stools.      0     BP 168/78  Pulse 64  Temp(Src) 97.8 F (36.6 C) (Oral)  Ht 5\' 3"  (1.6 m)  Wt 135 lb (61.236 kg)  BMI 23.92 kg/m2  SpO2 94%  Physical Exam CONSTITUTIONAL: Well developed/well nourished HEAD: Normocephalic/atraumatic EYES: EOMI/PERRL ENMT: Mucous membranes moist NECK: supple no meningeal signs SPINE:entire spine nontender CV:no murmurs/rubs/gallops noted LUNGS: Lungs are clear to auscultation bilaterally, no apparent distress ABDOMEN: soft, nontender, no rebound or guarding GU:no cva tenderness NEURO: Pt is awake/alert, moves all extremitiesx4 EXTREMITIES: pulses normal, full ROM SKIN: warm, color normal PSYCH: no abnormalities of mood noted  ED Course  Procedures (including critical care time)  Labs Reviewed  POCT I-STAT, CHEM 8 - Abnormal; Notable for the following:    Glucose, Bld 117 (*)    All other components within normal limits  URINE CULTURE  URINALYSIS, ROUTINE W REFLEX MICROSCOPIC   Pt well appearing, no distress It appears that she has uti.  Due to med allergies, low dose of cipro given for 7 days I advised need for PCP and urology  followup if her hematuria does not resolve with antibiotics EPIC note  sent to PCP  MDM  Nursing notes including past medical history and social history reviewed and considered in documentation Labs/vital reviewed and considered         Sharyon Cable, MD 11/05/12 1125

## 2012-11-05 NOTE — Telephone Encounter (Signed)
OK to fill this prescription with additional refills x3 Thank you!  

## 2012-11-05 NOTE — Telephone Encounter (Signed)
Done

## 2012-11-05 NOTE — ED Notes (Signed)
Pt sts blood in urine starting yesterday  am; had colonoscopy on Friday afternoon for 7 weeks of diarrhea; has been on flagyl and many other meds for long-term diarrhea; was supposed to go to PMD to provide urine speciemn at some point today, but upon waking this am, was more blood than urine and pt wanted to come to ED.

## 2012-11-06 LAB — URINE CULTURE

## 2012-11-07 NOTE — ED Notes (Signed)
+   Urine Patient treated with cipro-sensitive to same-chart appended per protocol MD.

## 2012-11-08 ENCOUNTER — Ambulatory Visit: Payer: Medicare Other | Admitting: Internal Medicine

## 2012-11-11 ENCOUNTER — Encounter: Payer: Self-pay | Admitting: Gastroenterology

## 2012-11-13 ENCOUNTER — Telehealth: Payer: Self-pay | Admitting: Gastroenterology

## 2012-11-13 MED ORDER — BUDESONIDE 9 MG PO TB24: 1.0000 | ORAL_TABLET | Freq: Every day | ORAL | Status: DC

## 2012-11-13 NOTE — Telephone Encounter (Signed)
Informed pt of bx results and she should receive a letter soon. She did take the 6 Uceris given to her, but she either did not pick up the script or more were not ordered.. I will give her samples for 16 days and she has a f/u on 11/28/12. Pt stated understanding.

## 2012-11-13 NOTE — Telephone Encounter (Signed)
Path shows COLLAGENOUS COLITIS; please advise. Thanks.

## 2012-11-13 NOTE — Telephone Encounter (Signed)
See letter.

## 2012-11-15 ENCOUNTER — Other Ambulatory Visit (INDEPENDENT_AMBULATORY_CARE_PROVIDER_SITE_OTHER): Payer: Medicare Other

## 2012-11-15 ENCOUNTER — Encounter: Payer: Self-pay | Admitting: Internal Medicine

## 2012-11-15 ENCOUNTER — Ambulatory Visit (INDEPENDENT_AMBULATORY_CARE_PROVIDER_SITE_OTHER): Payer: Medicare Other | Admitting: Internal Medicine

## 2012-11-15 VITALS — BP 162/72 | HR 80 | Temp 98.2°F | Resp 16 | Wt 138.0 lb

## 2012-11-15 DIAGNOSIS — R202 Paresthesia of skin: Secondary | ICD-10-CM

## 2012-11-15 DIAGNOSIS — N390 Urinary tract infection, site not specified: Secondary | ICD-10-CM | POA: Diagnosis not present

## 2012-11-15 DIAGNOSIS — R209 Unspecified disturbances of skin sensation: Secondary | ICD-10-CM

## 2012-11-15 DIAGNOSIS — I10 Essential (primary) hypertension: Secondary | ICD-10-CM

## 2012-11-15 DIAGNOSIS — R197 Diarrhea, unspecified: Secondary | ICD-10-CM | POA: Diagnosis not present

## 2012-11-15 DIAGNOSIS — D649 Anemia, unspecified: Secondary | ICD-10-CM

## 2012-11-15 LAB — URINALYSIS
Bilirubin Urine: NEGATIVE
Ketones, ur: NEGATIVE
Leukocytes, UA: NEGATIVE
Specific Gravity, Urine: 1.025 (ref 1.000–1.030)
Urobilinogen, UA: 0.2 (ref 0.0–1.0)

## 2012-11-15 NOTE — Progress Notes (Signed)
Subjective:    HPI  F/u diarhea - resolved on steroids.  C/o recent UTI - took Cipro  C/o fatigue, not sleeping well  She had L TKR in 3/13 - doing well  The patient presents for a follow-up of  chronic hypertension, chronic dyslipidemia, knee OA - not controlled with medicines. No wt loss. BP is nl at home.   Wt Readings from Last 3 Encounters:  11/15/12 138 lb (62.596 kg)  11/05/12 135 lb (61.236 kg)  11/01/12 141 lb (63.957 kg)   BP Readings from Last 3 Encounters:  11/15/12 162/72  11/05/12 168/78  11/01/12 178/73      Past Medical History  Diagnosis Date  . GOITER, MULTINODULAR 05/06/2010    Dr [name]  . HYPERTHYROIDISM 05/23/2010  . HYPERLIPIDEMIA 08/20/2007  . ANEMIA-NOS 08/20/2007  . ANXIETY 03/16/2007  . CARPAL TUNNEL SYNDROME, RIGHT 01/11/2008  . HYPERTENSION 03/16/2007  . VENOUS INSUFFICIENCY 09/05/2007  . DIVERTICULOSIS, COLON 08/20/2007  . CELLULITIS, LEG, RIGHT 08/20/2007  . OSTEOARTHRITIS 03/16/2007  . OSTEOPOROSIS 08/20/2007  . SCOLIOSIS 01/11/2008  . BREAST CANCER, HX OF 03/16/2007  . SKIN CANCER, HX OF 11/12/2008     R cheek 2010, L Cheek 2011 Dr. Tonia Brooms  . PREMATURE ATRIAL CONTRACTIONS 09/21/2010  . Thyroid nodule   . Cataract     floaters  . Pneumonia 07/2011    took abx for several weeks  . Diverticulosis of colon (without mention of hemorrhage)   . IBS (irritable bowel syndrome)    Past Surgical History  Procedure Laterality Date  . Total knee arthroplasty    . Breast lumpectomy Left 2001  . Abdominal hysterectomy    . Appendectomy  1972  . Mastectomy, partial Left   . Carpal tunnel release    . Total knee arthroplasty Right 2010    Dr Para March  . Cataract extraction    . Skin cancer excision      face-Dr. Bing Plume  . Xrt      chemo, surgery  . Eye surgery      cataract ext  . Total knee arthroplasty  10/30/2011    Procedure: TOTAL KNEE ARTHROPLASTY;  Surgeon: Lorn Junes, MD;  Location: [facility];  Service: Orthopedics;  Laterality:  Left;     . Hernia repair      reports that she quit smoking about 31 years ago. She has never used smokeless tobacco. She reports that  drinks alcohol. She reports that she does not use illicit drugs. family history includes Alcohol abuse in her father; Alzheimer's disease in her mother; Dementia in her mother; Diabetes in her other; Heart attack in her father; Heart disease in her mother; Hypertension in her mother; Mental retardation in her mother; and Prostate cancer in her brother.  There is no history of Colon cancer and Anesthesia problems. Allergies  Allergen Reactions  . Cefuroxime Diarrhea  . Celecoxib Nausea Only  . Codeine Nausea Only  . Ranitidine     bloating  . Tramadol Hcl Nausea Only   Current Outpatient Prescriptions on File Prior to Visit  Medication Sig Dispense Refill  . ALPRAZolam (XANAX) 0.25 MG tablet Take 1-2 tablets (0.25-0.5 mg total) by mouth 3 (three) times daily as needed for sleep.  90 tablet  3  . aspirin 325 MG tablet Take 325 mg by mouth daily.      . Budesonide (UCERIS) 9 MG TB24 Take 1 tablet by mouth daily.  26 tablet  0  . Calcium Carbonate-Vitamin D (CALCIUM 600+D)  600-400 MG-UNIT per tablet Take 1 tablet by mouth daily.      . carvedilol (COREG) 12.5 MG tablet TAKE 1 TABLET TWICE A DAY  180 tablet  3  . Fluticasone-Salmeterol (ADVAIR DISKUS) 250-50 MCG/DOSE AEPB Inhale 1 puff into the lungs 2 (two) times daily.  1 each  3  . losartan (COZAAR) 100 MG tablet TAKE 1 TABLET BY MOUTH EVERY DAY  30 tablet  11  . Multiple Vitamins-Minerals (MULTIVITAMIN PO) Take 1 each by mouth daily.      . ciprofloxacin (CIPRO) 250 MG tablet Take 1 tablet (250 mg total) by mouth every 12 (twelve) hours.  14 tablet  0  . loperamide (IMODIUM A-D) 2 MG tablet Take 1 tablet (2 mg total) by mouth as needed for diarrhea or loose stools.    0  . loperamide (IMODIUM) 1 MG/5ML solution Take 3 mg by mouth 4 (four) times daily as needed for diarrhea or loose stools.      .  Loperamide HCl (IMODIUM PO) Take 15 mLs by mouth 2 (two) times daily as needed. For diarrhea       No current facility-administered medications on file prior to visit.   BP 162/72  Pulse 80  Temp(Src) 98.2 F (36.8 C) (Oral)  Resp 16  Wt 138 lb (62.596 kg)  BMI 24.45 kg/m2   Review of Systems  Constitutional: Negative for activity change, appetite change, fatigue and unexpected weight change.  HENT: Positive for congestion. Negative for mouth sores and sinus pressure.   Eyes: Negative for visual disturbance.  Respiratory: Negative for chest tightness.   Gastrointestinal: Negative for nausea.  Genitourinary: Negative for frequency, difficulty urinating and vaginal pain.  Musculoskeletal: Positive for joint swelling (L knee hurts) and gait problem. Negative for back pain.  Skin: Negative for pallor.  Neurological: Negative for dizziness, tremors, weakness and numbness.  Psychiatric/Behavioral: Negative for suicidal ideas, confusion and sleep disturbance. The patient is not nervous/anxious.        Objective:   Physical Exam  Constitutional: She appears well-developed and well-nourished. No distress.  HENT:  Head: Normocephalic.  Right Ear: External ear normal.  Left Ear: External ear normal.  Nose: Nose normal.  Nasal mucosa is swollen  Eyes: Conjunctivae are normal. Pupils are equal, round, and reactive to light. Right eye exhibits no discharge. Left eye exhibits no discharge.  Neck: Normal range of motion. Neck supple. No JVD present. No tracheal deviation present. No thyromegaly present.  Cardiovascular: Normal rate, regular rhythm and normal heart sounds.   Pulmonary/Chest: No stridor. No respiratory distress. She has no wheezes.  Abdominal: Soft. Bowel sounds are normal. She exhibits no distension and no mass. There is no tenderness. There is no rebound and no guarding.  Musculoskeletal: She exhibits tenderness (L knee). She exhibits no edema.  Lymphadenopathy:    She has  no cervical adenopathy.  Neurological: She displays normal reflexes. No cranial nerve deficit. She exhibits normal muscle tone. Coordination normal.  Skin: No rash noted. No erythema.  Psychiatric: She has a normal mood and affect. Her behavior is normal. Judgment and thought content normal.  L knee w/a post-op scar Wax in R ear Lab Results  Component Value Date   WBC 6.3 08/07/2012   HGB 13.6 11/05/2012   HCT 40.0 11/05/2012   PLT 341.0 08/07/2012   GLUCOSE 117* 11/05/2012   CHOL 172 03/23/2011   TRIG 78.0 03/23/2011   HDL 63.80 03/23/2011   LDLCALC 93 03/23/2011   ALT 23 08/07/2012  AST 30 08/07/2012   NA 142 11/05/2012   K 4.4 11/05/2012   CL 105 11/05/2012   CREATININE 1.00 11/05/2012   BUN 15 11/05/2012   CO2 29 08/07/2012   TSH 1.47 08/07/2012   INR 0.95 10/25/2011   HGBA1C 5.6 09/08/2009   MICROALBUR 0.6 03/23/2011        Assessment & Plan:

## 2012-11-15 NOTE — Assessment & Plan Note (Signed)
Chronic and controlled BP at home has been WNL Continue with current prescription therapy as reflected on the Med list.

## 2012-11-15 NOTE — Assessment & Plan Note (Signed)
Treated w/Cipro

## 2012-11-15 NOTE — Assessment & Plan Note (Signed)
Improved on steroids F/u w/Dr Sharlett Iles

## 2012-11-15 NOTE — Assessment & Plan Note (Signed)
Better  

## 2012-11-20 ENCOUNTER — Encounter: Payer: Self-pay | Admitting: *Deleted

## 2012-11-28 ENCOUNTER — Encounter: Payer: Self-pay | Admitting: Gastroenterology

## 2012-11-28 ENCOUNTER — Ambulatory Visit (INDEPENDENT_AMBULATORY_CARE_PROVIDER_SITE_OTHER): Payer: Medicare Other | Admitting: Gastroenterology

## 2012-11-28 VITALS — BP 172/78 | HR 59 | Ht 63.0 in | Wt 137.0 lb

## 2012-11-28 DIAGNOSIS — K52831 Collagenous colitis: Secondary | ICD-10-CM

## 2012-11-28 DIAGNOSIS — R197 Diarrhea, unspecified: Secondary | ICD-10-CM | POA: Diagnosis not present

## 2012-11-28 DIAGNOSIS — K5289 Other specified noninfective gastroenteritis and colitis: Secondary | ICD-10-CM

## 2012-11-28 NOTE — Progress Notes (Signed)
History of Present Illness: This is a 79 year old Caucasian female with diagnosed collagenous colitis that has responded well to Uceris 9 mg a day topical steroid preparation.  She currently is having normal bowel movements and denies gastrointestinal issues.    Current Medications, Allergies, Past Medical History, Past Surgical History, Family History and Social History were reviewed in [employer] [employer] record.  ROS: All systems were reviewed and are negative unless otherwise stated in the HPI.         Assessment and plan: I reviewed her diagnosis in detail with this patient, explaining the pathophysiology, and have gone over her diet with her in detail.  I am not sure she understands much of anything that I have told her.  I have asked her to continue Uceris 9 mg a day for 10 more days, then have given her samples to use every other day for 2-3 weeks.  She is to call for progress report at that time.  If she has a relapse off meds, we will use generic budesonide 3-6-9 mg a day to control her symptoms.  Vascular to avoid NSAIDs if at all possible common use when necessary Pepto-Bismol.  His continue medical followup care with Dr. Lanice Schwab. No diagnosis found.

## 2012-11-28 NOTE — Patient Instructions (Addendum)
Finish your Uceris one tablet by mouth once daily for ten days. Then take one tablet by mouth every other day for ten days and then stop. Samples given.  Call if there is a relapse.  Follow up in one year. ______________________________________________________________                                               We are excited to introduce MyChart, a new best-in-class service that provides you online access to important information in your electronic medical record. We want to make it easier for you to view your health information - all in one secure location - when and where you need it. We expect MyChart will enhance the quality of care and service we provide.  When you register for MyChart, you can:    View your test results.    Request appointments and receive appointment reminders via email.    Request medication renewals.    View your medical history, allergies, medications and immunizations.    Communicate with your physician's office through a password-protected site.    Conveniently print information such as your medication lists.  To find out if MyChart is right for you, please talk to a member of our clinical staff today. We will gladly answer your questions about this free health and wellness tool.  If you are age 67 or older and want a member of your family to have access to your record, you must provide written consent by completing a proxy form available at our office. Please speak to our clinical staff about guidelines regarding accounts for patients younger than age 72.  As you activate your MyChart account and need any technical assistance, please call the MyChart technical support line at [phone]) or email your question to [email or web address]. If you email your question(s), please include your name, a return phone number and the best time to reach you.  If you have non-urgent health-related questions, you can send a message to our office  through [place] at [email or web address]. If you have a medical emergency, call 911.  Thank you for using MyChart as your new health and wellness resource!   MyChart licensed from [employer],  1999-2010. Patents Pending. _____________________________________________________________________________________________________________________________________________     Collagenous Colitis, Lymphocytic Colitis Inflammatory bowel disease is a general name for diseases that cause inflammation in the intestines. Collagenous colitis and lymphocytic colitis are two types of bowel inflammation that affect the large intestine (colon).  They are not related to more severe forms of inflammatory bowel disease (Crohn's disease or ulcerative colitis). CAUSES  No precise cause has been found for collagenous colitis or lymphocytic colitis. Possible causes include:  Bacteria and their toxins.  Viruses.  Nonsteroidal anti-inflammatory drugs (NSAIDs). Some researchers have suggested that collagenous colitis and lymphocytic colitis result from an autoimmune response. This means that the body's immune system destroys cells for no known reason. SYMPTOMS   The symptoms are similar for both; chronic watery, non-bloody diarrhea.  The diarrhea may be continuous or episodic. Abdominal pain or cramps may also be present. DIAGNOSIS  The diagnosis of collagenous colitis or lymphocytic colitis is made after tissue samples are taken during colonoscopy or flexible sigmoidoscopy. These are tests that use tools to look inside your colon without having to operate. Samples taken from your colon are examined under a microscope.   Collagenous colitis is  characterized by collagen deposits inside the lining of the colon. Multiple tissue samples from different areas of the colon may need to be examined. People with collagenous colitis are most often diagnosed in their 41's, although some cases have been reported in  adults younger than 65 years and in children aged 41 to 84. It is diagnosed more frequently in women than men.  In lymphocytic colitis, tissue samples show inflammation with white blood cells, known as lymphocytes, between the cells that line the colon. In contrast to collagenous colitis, there is no abnormality of the collagen. TREATMENT  Treatment depends on the symptoms and severity of the cases. The diseases have been known to resolve by themselves, but most patients have recurrent symptoms.  Lifestyle changes aimed at improving diarrhea are usually tried first. They include:  Reducing the amount of fat in the diet.  Eliminating foods that contain caffeine or lactose.  Not using NSAIDs.  If lifestyle changes are not enough, medicines are often used to control the symptoms. They include:  Anti-diarrheal medicines.  Anti-inflammatory medicines.  Immunosuppressive agents, which reduce the autoimmune response. This medicine is rarely needed.  For very extreme cases, bypass of the colon or surgery to remove all or part of the colon has been done in a few patients. This is rare.  Collagenous colitis and lymphocytic colitis do not increase the risk of colon cancer. FOR MORE INFORMATION  Crohn's & Colitis Foundation of [employer] UpdateRate.fr National Institute of Diabetes and Digestive and Kidney Diseases: [employer] Document Released: 07/07/2004 Document Revised: 10/30/2011 Document Reviewed: 12/11/2007 [facility] Patient Information 2013 [place].

## 2012-12-11 DIAGNOSIS — H26499 Other secondary cataract, unspecified eye: Secondary | ICD-10-CM | POA: Diagnosis not present

## 2013-01-16 ENCOUNTER — Ambulatory Visit (HOSPITAL_COMMUNITY): Payer: Medicare Other | Attending: Cardiology | Admitting: Radiology

## 2013-01-16 DIAGNOSIS — I509 Heart failure, unspecified: Secondary | ICD-10-CM | POA: Diagnosis not present

## 2013-01-16 DIAGNOSIS — I499 Cardiac arrhythmia, unspecified: Secondary | ICD-10-CM | POA: Insufficient documentation

## 2013-01-16 DIAGNOSIS — R9431 Abnormal electrocardiogram [ECG] [EKG]: Secondary | ICD-10-CM | POA: Diagnosis not present

## 2013-01-16 NOTE — Progress Notes (Signed)
Echocardiogram performed.  

## 2013-01-20 ENCOUNTER — Telehealth: Payer: Self-pay | Admitting: Cardiovascular Disease

## 2013-01-20 NOTE — Telephone Encounter (Signed)
PT AWARE OF ECHO RESULTS./CY 

## 2013-01-20 NOTE — Telephone Encounter (Signed)
Follow Up     Pt calling in following up on ECHO results. Please call.

## 2013-01-21 ENCOUNTER — Encounter: Payer: Self-pay | Admitting: Cardiovascular Disease

## 2013-01-21 ENCOUNTER — Other Ambulatory Visit: Payer: Self-pay | Admitting: *Deleted

## 2013-01-21 ENCOUNTER — Ambulatory Visit (INDEPENDENT_AMBULATORY_CARE_PROVIDER_SITE_OTHER): Payer: Medicare Other | Admitting: Cardiovascular Disease

## 2013-01-21 VITALS — BP 130/80 | HR 69 | Wt 136.0 lb

## 2013-01-21 DIAGNOSIS — I5022 Chronic systolic (congestive) heart failure: Secondary | ICD-10-CM

## 2013-01-21 DIAGNOSIS — R0609 Other forms of dyspnea: Secondary | ICD-10-CM | POA: Diagnosis not present

## 2013-01-21 DIAGNOSIS — R0600 Dyspnea, unspecified: Secondary | ICD-10-CM

## 2013-01-21 DIAGNOSIS — I509 Heart failure, unspecified: Secondary | ICD-10-CM

## 2013-01-21 DIAGNOSIS — I10 Essential (primary) hypertension: Secondary | ICD-10-CM

## 2013-01-21 DIAGNOSIS — Z79899 Other long term (current) drug therapy: Secondary | ICD-10-CM

## 2013-01-21 DIAGNOSIS — R0989 Other specified symptoms and signs involving the circulatory and respiratory systems: Secondary | ICD-10-CM | POA: Diagnosis not present

## 2013-01-21 DIAGNOSIS — I872 Venous insufficiency (chronic) (peripheral): Secondary | ICD-10-CM

## 2013-01-21 DIAGNOSIS — I491 Atrial premature depolarization: Secondary | ICD-10-CM

## 2013-01-21 LAB — BASIC METABOLIC PANEL
BUN: 16 mg/dL (ref 6–23)
Creatinine, Ser: 0.9 mg/dL (ref 0.4–1.2)
GFR: 60.66 mL/min (ref >60.00)
Potassium: 4.1 mEq/L (ref 3.5–5.1)

## 2013-01-21 LAB — BRAIN NATRIURETIC PEPTIDE: Pro B Natriuretic peptide (BNP): 1175 pg/mL — ABNORMAL HIGH (ref 0.0–100.0)

## 2013-01-21 MED ORDER — FUROSEMIDE 20 MG PO TABS: 20.0000 mg | ORAL_TABLET | Freq: Every day | ORAL | Status: DC

## 2013-01-21 NOTE — Progress Notes (Signed)
Patient ID: Mia Contreras, female   DOB: 05-08-1929, 79 y.o.   MRN: [id number] 79 yo referred for irregular heart beat. Chronic asymptomatic. Noted as far back as 2010 before knee surgery. Actually has PAC;s with multifocal origin. No palpitations, dsypnea, SSCP or syncope. No afib and no TIA. Still working 3 days/week at [employer]. Reviewed dobutamine echo from 2010 and normal with multifocal PAC/MAT. Has HTN Rx with calcium blocker. Not on beta blocker. Multinodular goiter with normal thyroid labs this year. Denies SSCP, dyspnea, neuro symptoms,  Had echo 04/26/11 EF 35% with diffuse hypokinesis Carotid same day also reviewed 0-39% bilateral disease.  She is asymptomatic. Had XRT and chemo a long time ago for her breast CA.   Reviewed myovue from 05/18/11 EF 32% no ischemia  Echo 01/16/13  Study Conclusions  - Left ventricle: The cavity size was mildly dilated. Wall thickness was normal. The estimated ejection fraction was 20%. Diffuse hypokinesis. Findings consistent with left ventricular diastolic dysfunction. - Aortic valve: Sclerosis without stenosis. No significant regurgitation. - Mitral valve: Mild regurgitation. - Left atrium: The atrium was mildly to moderately dilated. - Right ventricle: The cavity size was normal. Systolic function was mildly reduced by visual assessment. - Pulmonary arteries: PA peak pressure: 37mm Hg (S).  Spoke with brother Mia Contreras today [phone]  ROS: Denies fever, malais, weight loss, blurry vision, decreased visual acuity, cough, sputum,  hemoptysis, pleuritic pain, palpitaitons, heartburn, abdominal pain, melena, lower extremity edema, claudication, or rash.  All other systems reviewed and negative  General: Affect appropriate Healthy:  appears stated age 33: normal Neck supple with no adenopathy JVP normal no bruits no thyromegaly Lungs clear with no wheezing and good diaphragmatic motion Heart:  S1/S2 no murmur, no rub, gallop or click PMI  normal Abdomen: benighn, BS positve, no tenderness, no AAA no bruit.  No HSM or HJR Distal pulses intact with no bruits No edema Neuro non-focal Skin warm and dry No muscular weakness   Current Outpatient Prescriptions  Medication Sig Dispense Refill  . ALPRAZolam (XANAX) 0.25 MG tablet Take 1-2 tablets (0.25-0.5 mg total) by mouth 3 (three) times daily as needed for sleep.  90 tablet  3  . aspirin 325 MG tablet Take 325 mg by mouth daily.      . Calcium Carbonate-Vitamin D (CALCIUM 600+D) 600-400 MG-UNIT per tablet Take 1 tablet by mouth daily.      . carvedilol (COREG) 12.5 MG tablet TAKE 1 TABLET TWICE A DAY  180 tablet  3  . Fluticasone-Salmeterol (ADVAIR DISKUS) 250-50 MCG/DOSE AEPB Inhale 1 puff into the lungs 2 (two) times daily.  1 each  3  . losartan (COZAAR) 100 MG tablet TAKE 1 TABLET BY MOUTH EVERY DAY  30 tablet  11  . Multiple Vitamins-Minerals (MULTIVITAMIN PO) Take 1 each by mouth daily.       No current facility-administered medications for this visit.    Allergies  Cefuroxime; Celecoxib; Codeine; Ranitidine; and Tramadol hcl  ECG:  SR rate 69 LAD LVH  Assessment and Plan

## 2013-01-21 NOTE — Patient Instructions (Addendum)
Your physician wants you to follow-up in:   [place] will receive a reminder letter in the mail two months in advance. If you don't receive a letter, please call our office to schedule the follow-up appointment. Your physician recommends that you return for lab work in: TODAY BMET BNP  DX 428.0

## 2013-01-21 NOTE — Assessment & Plan Note (Signed)
May need additon of diuretic Will check BMET and BNP

## 2013-01-21 NOTE — Assessment & Plan Note (Signed)
Resolved no palpitations

## 2013-01-21 NOTE — Assessment & Plan Note (Signed)
Stable no significant edema

## 2013-01-21 NOTE — Assessment & Plan Note (Signed)
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

## 2013-01-23 ENCOUNTER — Other Ambulatory Visit: Payer: Self-pay | Admitting: Dermatology

## 2013-01-23 DIAGNOSIS — Z85828 Personal history of other malignant neoplasm of skin: Secondary | ICD-10-CM | POA: Diagnosis not present

## 2013-01-23 DIAGNOSIS — L570 Actinic keratosis: Secondary | ICD-10-CM | POA: Diagnosis not present

## 2013-01-23 DIAGNOSIS — D485 Neoplasm of uncertain behavior of skin: Secondary | ICD-10-CM | POA: Diagnosis not present

## 2013-01-23 DIAGNOSIS — D0439 Carcinoma in situ of skin of other parts of face: Secondary | ICD-10-CM | POA: Diagnosis not present

## 2013-01-23 DIAGNOSIS — L821 Other seborrheic keratosis: Secondary | ICD-10-CM | POA: Diagnosis not present

## 2013-01-23 DIAGNOSIS — D0470 Carcinoma in situ of skin of unspecified lower limb, including hip: Secondary | ICD-10-CM | POA: Diagnosis not present

## 2013-02-03 ENCOUNTER — Telehealth: Payer: Self-pay | Admitting: Cardiovascular Disease

## 2013-02-03 DIAGNOSIS — I509 Heart failure, unspecified: Secondary | ICD-10-CM

## 2013-02-03 MED ORDER — FUROSEMIDE 20 MG PO TABS: 10.0000 mg | ORAL_TABLET | ORAL | Status: DC

## 2013-02-03 NOTE — Telephone Encounter (Signed)
New Prob    Pt needs some clarification on directions for FUROSEMIDE. Please call.

## 2013-02-03 NOTE — Telephone Encounter (Signed)
Pt is to be taking 10 mg every other day.  She is aware  Chart will be corrected.

## 2013-02-07 ENCOUNTER — Ambulatory Visit (INDEPENDENT_AMBULATORY_CARE_PROVIDER_SITE_OTHER): Payer: Medicare Other | Admitting: Internal Medicine

## 2013-02-07 ENCOUNTER — Encounter: Payer: Self-pay | Admitting: Internal Medicine

## 2013-02-07 VITALS — BP 130/75 | HR 76 | Temp 97.9°F | Resp 16 | Wt 137.0 lb

## 2013-02-07 DIAGNOSIS — I5040 Unspecified combined systolic (congestive) and diastolic (congestive) heart failure: Secondary | ICD-10-CM

## 2013-02-07 DIAGNOSIS — R0609 Other forms of dyspnea: Secondary | ICD-10-CM

## 2013-02-07 DIAGNOSIS — R197 Diarrhea, unspecified: Secondary | ICD-10-CM

## 2013-02-07 DIAGNOSIS — I10 Essential (primary) hypertension: Secondary | ICD-10-CM

## 2013-02-07 DIAGNOSIS — F411 Generalized anxiety disorder: Secondary | ICD-10-CM | POA: Diagnosis not present

## 2013-02-07 DIAGNOSIS — R0989 Other specified symptoms and signs involving the circulatory and respiratory systems: Secondary | ICD-10-CM | POA: Diagnosis not present

## 2013-02-07 MED ORDER — LOSARTAN POTASSIUM 100 MG PO TABS: 100.0000 mg | ORAL_TABLET | Freq: Every day | ORAL | Status: DC

## 2013-02-07 MED ORDER — PANCRELIPASE (LIP-PROT-AMYL) 24000-76000 UNITS PO CPEP: ORAL_CAPSULE | ORAL | Status: DC

## 2013-02-07 MED ORDER — ALPRAZOLAM 0.25 MG PO TABS: 0.2500 mg | ORAL_TABLET | Freq: Three times a day (TID) | ORAL | Status: DC | PRN

## 2013-02-07 MED ORDER — CARVEDILOL 12.5 MG PO TABS: 12.5000 mg | ORAL_TABLET | Freq: Two times a day (BID) | ORAL | Status: DC

## 2013-02-07 NOTE — Assessment & Plan Note (Signed)
Continue with current prescription therapy as reflected on the Med list.  

## 2013-02-07 NOTE — Assessment & Plan Note (Signed)
Doing well - stable

## 2013-02-07 NOTE — Progress Notes (Signed)
Subjective:    HPI  F/u diarhea - resolved on steroids.  F/u recent UTI - took Cipro  F/u fatigue, not sleeping well  She had L TKR in 3/13 - doing well  The patient presents for a follow-up of  chronic hypertension, chronic dyslipidemia, knee OA - not controlled with medicines. No wt loss. BP is nl at home.  Bruised R arm 1 wk ago - fell   Wt Readings from Last 3 Encounters:  02/07/13 137 lb (62.143 kg)  01/21/13 136 lb (61.689 kg)  11/28/12 137 lb (62.143 kg)   BP Readings from Last 3 Encounters:  02/07/13 156/78  01/21/13 130/80  11/28/12 172/78      Past Medical History  Diagnosis Date  . GOITER, MULTINODULAR 05/06/2010    Dr [name]  . HYPERTHYROIDISM 05/23/2010  . HYPERLIPIDEMIA 08/20/2007  . ANEMIA-NOS 08/20/2007  . ANXIETY 03/16/2007  . CARPAL TUNNEL SYNDROME, RIGHT 01/11/2008  . HYPERTENSION 03/16/2007  . VENOUS INSUFFICIENCY 09/05/2007  . DIVERTICULOSIS, COLON 08/20/2007  . CELLULITIS, LEG, RIGHT 08/20/2007  . OSTEOARTHRITIS 03/16/2007  . OSTEOPOROSIS 08/20/2007  . SCOLIOSIS 01/11/2008  . BREAST CANCER, HX OF 03/16/2007  . SKIN CANCER, HX OF 11/12/2008     R cheek 2010, L Cheek 2011 Dr. Tonia Brooms  . PREMATURE ATRIAL CONTRACTIONS 09/21/2010  . Thyroid nodule   . Cataract     floaters  . Pneumonia 07/2011    took abx for several weeks  . IBS (irritable bowel syndrome)   . Collagenous colitis    Past Surgical History  Procedure Laterality Date  . Total knee arthroplasty    . Breast lumpectomy Left 2001  . Abdominal hysterectomy    . Appendectomy  1972  . Mastectomy, partial Left   . Carpal tunnel release    . Total knee arthroplasty Right 2010    Dr Para March  . Cataract extraction    . Skin cancer excision      face-Dr. Bing Plume  . Xrt      chemo, surgery  . Eye surgery      cataract ext  . Total knee arthroplasty  10/30/2011    Procedure: TOTAL KNEE ARTHROPLASTY;  Surgeon: Lorn Junes, MD;  Location: [place];  Service: Orthopedics;  Laterality:  Left;     . Hernia repair      reports that she quit smoking about 32 years ago. She has never used smokeless tobacco. She reports that  drinks alcohol. She reports that she does not use illicit drugs. family history includes Alcohol abuse in her father; Alzheimer's disease in her mother; Dementia in her mother; Diabetes in her other; Heart attack in her father; Heart disease in her mother; Hypertension in her mother; Mental retardation in her mother; and Prostate cancer in her brother.  There is no history of Colon cancer and Anesthesia problems. Allergies  Allergen Reactions  . Cefuroxime Diarrhea  . Celecoxib Nausea Only  . Codeine Nausea Only  . Ranitidine     bloating  . Tramadol Hcl Nausea Only   Current Outpatient Prescriptions on File Prior to Visit  Medication Sig Dispense Refill  . ALPRAZolam (XANAX) 0.25 MG tablet Take 1-2 tablets (0.25-0.5 mg total) by mouth 3 (three) times daily as needed for sleep.  90 tablet  3  . aspirin 325 MG tablet Take 325 mg by mouth daily.      . Calcium Carbonate-Vitamin D (CALCIUM 600+D) 600-400 MG-UNIT per tablet Take 1 tablet by mouth daily.      Marland Kitchen  carvedilol (COREG) 12.5 MG tablet TAKE 1 TABLET TWICE A DAY  180 tablet  3  . Fluticasone-Salmeterol (ADVAIR DISKUS) 250-50 MCG/DOSE AEPB Inhale 1 puff into the lungs 2 (two) times daily.  1 each  3  . furosemide (LASIX) 20 MG tablet Take 0.5 tablets (10 mg total) by mouth every other day.  15 tablet  6  . losartan (COZAAR) 100 MG tablet TAKE 1 TABLET BY MOUTH EVERY DAY  30 tablet  11  . Multiple Vitamins-Minerals (MULTIVITAMIN PO) Take 1 each by mouth daily.       No current facility-administered medications on file prior to visit.   BP 156/78  Pulse 76  Temp(Src) 97.9 F (36.6 C) (Oral)  Resp 16  Wt 137 lb (62.143 kg)  BMI 24.27 kg/m2   Review of Systems  Constitutional: Negative for activity change, appetite change, fatigue and unexpected weight change.  HENT: Positive for congestion.  Negative for mouth sores and sinus pressure.   Eyes: Negative for visual disturbance.  Respiratory: Negative for chest tightness.   Gastrointestinal: Negative for nausea.  Genitourinary: Negative for frequency, difficulty urinating and vaginal pain.  Musculoskeletal: Positive for joint swelling (L knee hurts) and gait problem. Negative for back pain.  Skin: Negative for pallor.  Neurological: Negative for dizziness, tremors, weakness and numbness.  Psychiatric/Behavioral: Negative for suicidal ideas, confusion and sleep disturbance. The patient is not nervous/anxious.        Objective:   Physical Exam  Constitutional: She appears well-developed and well-nourished. No distress.  HENT:  Head: Normocephalic.  Right Ear: External ear normal.  Left Ear: External ear normal.  Nose: Nose normal.  Nasal mucosa is swollen  Eyes: Conjunctivae are normal. Pupils are equal, round, and reactive to light. Right eye exhibits no discharge. Left eye exhibits no discharge.  Neck: Normal range of motion. Neck supple. No JVD present. No tracheal deviation present. No thyromegaly present.  Cardiovascular: Normal rate, regular rhythm and normal heart sounds.   Pulmonary/Chest: No stridor. No respiratory distress. She has no wheezes.  Abdominal: Soft. Bowel sounds are normal. She exhibits no distension and no mass. There is no tenderness. There is no rebound and no guarding.  Musculoskeletal: She exhibits tenderness (L knee). She exhibits no edema.  Lymphadenopathy:    She has no cervical adenopathy.  Neurological: She displays normal reflexes. No cranial nerve deficit. She exhibits normal muscle tone. Coordination normal.  Skin: No rash noted. No erythema.  Psychiatric: She has a normal mood and affect. Her behavior is normal. Judgment and thought content normal.  L knee w/a post-op scar R arm w Camillia Herter  Lab Results  Component Value Date   WBC 6.3 08/07/2012   HGB 13.6 11/05/2012   HCT 40.0  11/05/2012   PLT 341.0 08/07/2012   GLUCOSE 95 01/21/2013   CHOL 172 03/23/2011   TRIG 78.0 03/23/2011   HDL 63.80 03/23/2011   LDLCALC 93 03/23/2011   ALT 23 08/07/2012   AST 30 08/07/2012   NA 140 01/21/2013   K 4.1 01/21/2013   CL 106 01/21/2013   CREATININE 0.9 01/21/2013   BUN 16 01/21/2013   CO2 28 01/21/2013   TSH 1.47 08/07/2012   INR 0.95 10/25/2011   HGBA1C 5.6 09/08/2009   MICROALBUR 0.6 03/23/2011        Assessment & Plan:

## 2013-02-07 NOTE — Assessment & Plan Note (Signed)
Will try Creon

## 2013-02-14 ENCOUNTER — Ambulatory Visit: Payer: Medicare Other | Admitting: Internal Medicine

## 2013-02-26 ENCOUNTER — Encounter: Payer: Self-pay | Admitting: Internal Medicine

## 2013-02-26 ENCOUNTER — Ambulatory Visit (INDEPENDENT_AMBULATORY_CARE_PROVIDER_SITE_OTHER): Payer: Medicare Other | Admitting: Internal Medicine

## 2013-02-26 VITALS — BP 148/82 | HR 70 | Temp 97.6°F | Ht 63.0 in | Wt 136.0 lb

## 2013-02-26 DIAGNOSIS — L03019 Cellulitis of unspecified finger: Secondary | ICD-10-CM

## 2013-02-26 DIAGNOSIS — L0390 Cellulitis, unspecified: Secondary | ICD-10-CM

## 2013-02-26 DIAGNOSIS — L089 Local infection of the skin and subcutaneous tissue, unspecified: Secondary | ICD-10-CM | POA: Diagnosis not present

## 2013-02-26 DIAGNOSIS — L03012 Cellulitis of left finger: Secondary | ICD-10-CM

## 2013-02-26 DIAGNOSIS — T148XXA Other injury of unspecified body region, initial encounter: Secondary | ICD-10-CM

## 2013-02-26 DIAGNOSIS — T798XXA Other early complications of trauma, initial encounter: Secondary | ICD-10-CM

## 2013-02-26 DIAGNOSIS — L0291 Cutaneous abscess, unspecified: Secondary | ICD-10-CM

## 2013-02-26 MED ORDER — SULFAMETHOXAZOLE-TRIMETHOPRIM 800-160 MG PO TABS: 1.0000 | ORAL_TABLET | Freq: Two times a day (BID) | ORAL | Status: DC

## 2013-02-26 NOTE — Patient Instructions (Signed)
Paronychia Paronychia is an inflammatory reaction involving the folds of the skin surrounding the fingernail. This is commonly caused by an infection in the skin around a nail. The most common cause of paronychia is frequent wetting of the hands (as seen with bartenders, food servers, nurses or others who wet their hands). This makes the skin around the fingernail susceptible to infection by bacteria (germs) or fungus. Other predisposing factors are:  Aggressive manicuring.  Nail biting.  Thumb sucking. The most common cause is a staphylococcal (a type of germ) infection, or a fungal (Candida) infection. When caused by a germ, it usually comes on suddenly with redness, swelling, pus and is often painful. It may get under the nail and form an abscess (collection of pus), or form an abscess around the nail. If the nail itself is infected with a fungus, the treatment is usually prolonged and may require oral medicine for up to one year. Your caregiver will determine the length of time treatment is required. The paronychia caused by bacteria (germs) may largely be avoided by not pulling on hangnails or picking at cuticles. When the infection occurs at the tips of the finger it is called felon. When the cause of paronychia is from the herpes simplex virus (HSV) it is called herpetic whitlow. TREATMENT  When an abscess is present treatment is often incision and drainage. This means that the abscess must be cut open so the pus can get out. When this is done, the following home care instructions should be followed. HOME CARE INSTRUCTIONS   It is important to keep the affected fingers very dry. Rubber or plastic gloves over cotton gloves should be used whenever the hand must be placed in water.  Keep wound clean, dry and dressed as suggested by your caregiver between warm soaks or warm compresses.  Soak in warm water for fifteen to twenty minutes three to four times per day for bacterial infections. Fungal  infections are very difficult to treat, so often require treatment for long periods of time.  For bacterial (germ) infections take antibiotics (medicine which kill germs) as directed and finish the prescription, even if the problem appears to be solved before the medicine is gone.  Only take over-the-counter or prescription medicines for pain, discomfort, or fever as directed by your caregiver. SEEK IMMEDIATE MEDICAL CARE IF:  You have redness, swelling, or increasing pain in the wound.  You notice pus coming from the wound.  You have a fever.  You notice a bad smell coming from the wound or dressing. Document Released: 01/31/2001 Document Revised: 10/30/2011 Document Reviewed: 10/02/2008 [employer] Patient Information 2014 [employer]  

## 2013-02-26 NOTE — Progress Notes (Signed)
Subjective:    Patient ID: Mia Contreras, female    DOB: 1928/10/29, 79 y.o.   MRN: [id number]  HPI  Pt presents to the clinic today with c/o a rash on her arm. She was trimming her bushes outside when she noticed that she got scratched by a limb. It did bleed. She has been cleaning it well and putting bacitracin on it as well as a bandaid. There is some redness around the area, and she does report the wound is feelled with pus. It has not drained. She denies fever, or chills. She also c/o of fluid collected around her left thumb. This started 4 days ago. She has had these in the past and has had to have them drained. She would like it drained today.  Review of Systems      Past Medical History  Diagnosis Date  . GOITER, MULTINODULAR 05/06/2010    Dr [name]  . HYPERTHYROIDISM 05/23/2010  . HYPERLIPIDEMIA 08/20/2007  . ANEMIA-NOS 08/20/2007  . ANXIETY 03/16/2007  . CARPAL TUNNEL SYNDROME, RIGHT 01/11/2008  . HYPERTENSION 03/16/2007  . VENOUS INSUFFICIENCY 09/05/2007  . DIVERTICULOSIS, COLON 08/20/2007  . CELLULITIS, LEG, RIGHT 08/20/2007  . OSTEOARTHRITIS 03/16/2007  . OSTEOPOROSIS 08/20/2007  . SCOLIOSIS 01/11/2008  . BREAST CANCER, HX OF 03/16/2007  . SKIN CANCER, HX OF 11/12/2008     R cheek 2010, L Cheek 2011 Dr. Tonia Brooms  . PREMATURE ATRIAL CONTRACTIONS 09/21/2010  . Thyroid nodule   . Cataract     floaters  . Pneumonia 07/2011    took abx for several weeks  . IBS (irritable bowel syndrome)   . Collagenous colitis     Current Outpatient Prescriptions  Medication Sig Dispense Refill  . ALPRAZolam (XANAX) 0.25 MG tablet Take 1-2 tablets (0.25-0.5 mg total) by mouth 3 (three) times daily as needed for sleep.  90 tablet  3  . aspirin 325 MG tablet Take 325 mg by mouth daily.      . Calcium Carbonate-Vitamin D (CALCIUM 600+D) 600-400 MG-UNIT per tablet Take 1 tablet by mouth daily.      . carvedilol (COREG) 12.5 MG tablet Take 1 tablet (12.5 mg total) by mouth 2 (two) times daily  with a meal.  180 tablet  3  . Fluticasone-Salmeterol (ADVAIR DISKUS) 250-50 MCG/DOSE AEPB Inhale 1 puff into the lungs 2 (two) times daily.  1 each  3  . furosemide (LASIX) 20 MG tablet Take 0.5 tablets (10 mg total) by mouth every other day.  15 tablet  6  . losartan (COZAAR) 100 MG tablet Take 1 tablet (100 mg total) by mouth daily.  90 tablet  3  . Multiple Vitamins-Minerals (MULTIVITAMIN PO) Take 1 each by mouth daily.      . Pancrelipase, Lip-Prot-Amyl, (CREON) 24000 UNITS CPEP 1 po tid w/larger meals  90 capsule  3   No current facility-administered medications for this visit.    Allergies  Allergen Reactions  . Cefuroxime Diarrhea  . Celecoxib Nausea Only  . Codeine Nausea Only  . Ranitidine     bloating  . Tramadol Hcl Nausea Only    Family History  Problem Relation Age of Onset  . Dementia Mother   . Heart disease Mother   . Mental retardation Mother   . Colon cancer Neg Hx   . Hypertension Mother   . Alzheimer's disease Mother   . Prostate cancer Brother   . Diabetes Other   . Heart attack Father   . Alcohol abuse Father   .  Anesthesia problems Neg Hx     History   Social History  . Marital Status: Single    Spouse Name: N/A    Number of Children: N/A  . Years of Education: N/A   Occupational History  . Not on file.   Social History Main Topics  . Smoking status: Former Smoker    Quit date: 12/20/1980  . Smokeless tobacco: Never Used  . Alcohol Use: Yes     Comment: socially  . Drug Use: No  . Sexually Active: Not Currently   Other Topics Concern  . Not on file   Social History Narrative  . No narrative on file     Constitutional: Denies fever, malaise, fatigue, headache or abrupt weight changes.  Skin: Pt reports infected wound on left arm and cyst around left nailbed. Denies redness, lesions or ulcercations.    No other specific complaints in a complete review of systems (except as listed in HPI above).  Objective:   Physical  Exam   BP 148/82  Pulse 70  Temp(Src) 97.6 F (36.4 C) (Oral)  Ht 5\' 3"  (1.6 m)  Wt 136 lb (61.689 kg)  BMI 24.1 kg/m2  SpO2 95% Wt Readings from Last 3 Encounters:  02/26/13 136 lb (61.689 kg)  02/07/13 137 lb (62.143 kg)  01/21/13 136 lb (61.689 kg)    General: Appears her stated age, well developed, well nourished in NAD. Skin:  Small infected wound on left bicep surrounded by area of cellulitis. Small cyst on cuticle of left thumb. Cardiovascular: Normal rate and rhythm. S1,S2 noted.  No murmur, rubs or gallops noted. No JVD or BLE edema. No carotid bruits noted. Pulmonary/Chest: Normal effort and positive vesicular breath sounds. No respiratory distress. No wheezes, rales or ronchi noted.    BMET    Component Value Date/Time   NA 140 01/21/2013 1055   K 4.1 01/21/2013 1055   CL 106 01/21/2013 1055   CO2 28 01/21/2013 1055   GLUCOSE 95 01/21/2013 1055   GLUCOSE 101* 08/01/2006 0841   BUN 16 01/21/2013 1055   CREATININE 0.9 01/21/2013 1055   CALCIUM 9.9 01/21/2013 1055   GFRNONAA 42* 11/02/2011 0538   GFRAA 48* 11/02/2011 0538    Lipid Panel     Component Value Date/Time   CHOL 172 03/23/2011 1000   TRIG 78.0 03/23/2011 1000   HDL 63.80 03/23/2011 1000   CHOLHDL 3 03/23/2011 1000   VLDL 15.6 03/23/2011 1000   LDLCALC 93 03/23/2011 1000    CBC    Component Value Date/Time   WBC 6.3 08/07/2012 1353   WBC 6.5 04/30/2008 1453   RBC 4.04 08/07/2012 1353   RBC 4.28 04/30/2008 1453   HGB 13.6 11/05/2012 1001   HGB 10.6* 04/30/2008 1453   HCT 40.0 11/05/2012 1001   HCT 32.9* 04/30/2008 1453   PLT 341.0 08/07/2012 1353   PLT 397 04/30/2008 1453   MCV 91.0 08/07/2012 1353   MCV 76.9* 04/30/2008 1453   MCH 30.9 11/02/2011 0538   MCH 24.9* 04/30/2008 1453   MCHC 33.3 08/07/2012 1353   MCHC 32.3 04/30/2008 1453   RDW 16.0* 08/07/2012 1353   RDW 17.1* 04/30/2008 1453   LYMPHSABS 1.5 08/07/2012 1353   LYMPHSABS 1.9 04/30/2008 1453   MONOABS 0.6 08/07/2012 1353   MONOABS 0.5 04/30/2008 1453    EOSABS 0.2 08/07/2012 1353   EOSABS 0.3 04/30/2008 1453   BASOSABS 0.0 08/07/2012 1353   BASOSABS 0.0 04/30/2008 1453    Hgb A1C Lab  Results  Component Value Date   HGBA1C 5.6 09/08/2009        Assessment & Plan:   Cellulitis and infected wound of left arm, new onset:  Continue to cleanse with soap and water eRx for Bactrim x 5 days  Paronychia of left thumb, new onset:  Inform Consent verbally obtained Risk and benefits of procedure explained including bleeding and risk for infection Area cleaned with alcohlol Numbed with lidocaine spray 25 g needle punctured through the skin Serous fluid drained from the site Cleaned, covered with band aid and triple antibiotic ointment  RTC as needed

## 2013-03-04 ENCOUNTER — Other Ambulatory Visit: Payer: Medicare Other

## 2013-03-05 ENCOUNTER — Other Ambulatory Visit (INDEPENDENT_AMBULATORY_CARE_PROVIDER_SITE_OTHER): Payer: Medicare Other

## 2013-03-05 DIAGNOSIS — R202 Paresthesia of skin: Secondary | ICD-10-CM

## 2013-03-05 DIAGNOSIS — R0989 Other specified symptoms and signs involving the circulatory and respiratory systems: Secondary | ICD-10-CM | POA: Diagnosis not present

## 2013-03-05 DIAGNOSIS — N390 Urinary tract infection, site not specified: Secondary | ICD-10-CM

## 2013-03-05 DIAGNOSIS — Z79899 Other long term (current) drug therapy: Secondary | ICD-10-CM | POA: Diagnosis not present

## 2013-03-05 DIAGNOSIS — R197 Diarrhea, unspecified: Secondary | ICD-10-CM | POA: Diagnosis not present

## 2013-03-05 DIAGNOSIS — R209 Unspecified disturbances of skin sensation: Secondary | ICD-10-CM | POA: Diagnosis not present

## 2013-03-05 DIAGNOSIS — I10 Essential (primary) hypertension: Secondary | ICD-10-CM | POA: Diagnosis not present

## 2013-03-05 DIAGNOSIS — R0609 Other forms of dyspnea: Secondary | ICD-10-CM | POA: Diagnosis not present

## 2013-03-05 DIAGNOSIS — I509 Heart failure, unspecified: Secondary | ICD-10-CM | POA: Diagnosis not present

## 2013-03-05 DIAGNOSIS — R0600 Dyspnea, unspecified: Secondary | ICD-10-CM

## 2013-03-05 LAB — BASIC METABOLIC PANEL
BUN: 18 mg/dL (ref 6–23)
Calcium: 9.6 mg/dL (ref 8.4–10.5)
Chloride: 103 mEq/L (ref 96–112)
Creatinine, Ser: 1.1 mg/dL (ref 0.4–1.2)
Creatinine, Ser: 1.1 mg/dL (ref 0.4–1.2)
Glucose, Bld: 80 mg/dL (ref 70–99)

## 2013-03-05 LAB — BRAIN NATRIURETIC PEPTIDE: Pro B Natriuretic peptide (BNP): 694 pg/mL — ABNORMAL HIGH (ref 0.0–100.0)

## 2013-03-05 LAB — HEPATIC FUNCTION PANEL
ALT: 22 U/L (ref 0–35)
Alkaline Phosphatase: 76 U/L (ref 39–117)
Bilirubin, Direct: 0.1 mg/dL (ref 0.0–0.3)
Total Protein: 7.1 g/dL (ref 6.0–8.3)

## 2013-03-05 LAB — CBC WITH DIFFERENTIAL/PLATELET
Basophils Relative: 0.8 % (ref 0.0–3.0)
Eosinophils Absolute: 0.3 10*3/uL (ref 0.0–0.7)
Eosinophils Relative: 5.5 % — ABNORMAL HIGH (ref 0.0–5.0)
Lymphocytes Relative: 30.1 % (ref 12.0–46.0)
Neutrophils Relative %: 51.3 % (ref 43.0–77.0)
RBC: 4.09 Mil/uL (ref 3.87–5.11)
WBC: 5.3 10*3/uL (ref 4.5–10.5)

## 2013-03-12 ENCOUNTER — Encounter: Payer: Self-pay | Admitting: Internal Medicine

## 2013-03-12 ENCOUNTER — Ambulatory Visit (INDEPENDENT_AMBULATORY_CARE_PROVIDER_SITE_OTHER): Payer: Medicare Other | Admitting: Internal Medicine

## 2013-03-12 DIAGNOSIS — H6121 Impacted cerumen, right ear: Secondary | ICD-10-CM

## 2013-03-12 DIAGNOSIS — F411 Generalized anxiety disorder: Secondary | ICD-10-CM

## 2013-03-12 DIAGNOSIS — R197 Diarrhea, unspecified: Secondary | ICD-10-CM | POA: Diagnosis not present

## 2013-03-12 DIAGNOSIS — H6120 Impacted cerumen, unspecified ear: Secondary | ICD-10-CM

## 2013-03-12 DIAGNOSIS — L0390 Cellulitis, unspecified: Secondary | ICD-10-CM

## 2013-03-12 DIAGNOSIS — E785 Hyperlipidemia, unspecified: Secondary | ICD-10-CM

## 2013-03-12 DIAGNOSIS — S81809A Unspecified open wound, unspecified lower leg, initial encounter: Secondary | ICD-10-CM | POA: Diagnosis not present

## 2013-03-12 DIAGNOSIS — L0291 Cutaneous abscess, unspecified: Secondary | ICD-10-CM | POA: Diagnosis not present

## 2013-03-12 DIAGNOSIS — S91009A Unspecified open wound, unspecified ankle, initial encounter: Secondary | ICD-10-CM | POA: Diagnosis not present

## 2013-03-12 MED ORDER — MUPIROCIN 2 % EX OINT: TOPICAL_OINTMENT | CUTANEOUS | Status: DC

## 2013-03-12 MED ORDER — SULFAMETHOXAZOLE-TRIMETHOPRIM 800-160 MG PO TABS: 1.0000 | ORAL_TABLET | Freq: Two times a day (BID) | ORAL | Status: DC

## 2013-03-12 NOTE — Assessment & Plan Note (Signed)
Continue with current prescription therapy as reflected on the Med list.  

## 2013-03-12 NOTE — Assessment & Plan Note (Signed)
See procedure 

## 2013-03-12 NOTE — Assessment & Plan Note (Addendum)
R lower leg 7/14 Bactroban If worse - use PO Bactrim RTC if not better

## 2013-03-12 NOTE — Progress Notes (Signed)
Subjective:    HPI C/o wound on R lower leg - injured w/a branch  A week ago  F/u diarhea - resolved on steroids.  F/u fatigue, not sleeping well  The patient presents for a follow-up of  chronic hypertension, chronic dyslipidemia, knee OA - not controlled with medicines. No wt loss. BP is nl at home.  C/o wax  Wt Readings from Last 3 Encounters:  02/26/13 136 lb (61.689 kg)  02/07/13 137 lb (62.143 kg)  01/21/13 136 lb (61.689 kg)   BP Readings from Last 3 Encounters:  03/12/13 162/78  02/26/13 148/82  02/07/13 130/75      Past Medical History  Diagnosis Date  . GOITER, MULTINODULAR 05/06/2010    Dr [name]  . HYPERTHYROIDISM 05/23/2010  . HYPERLIPIDEMIA 08/20/2007  . ANEMIA-NOS 08/20/2007  . ANXIETY 03/16/2007  . CARPAL TUNNEL SYNDROME, RIGHT 01/11/2008  . HYPERTENSION 03/16/2007  . VENOUS INSUFFICIENCY 09/05/2007  . DIVERTICULOSIS, COLON 08/20/2007  . CELLULITIS, LEG, RIGHT 08/20/2007  . OSTEOARTHRITIS 03/16/2007  . OSTEOPOROSIS 08/20/2007  . SCOLIOSIS 01/11/2008  . BREAST CANCER, HX OF 03/16/2007  . SKIN CANCER, HX OF 11/12/2008     R cheek 2010, L Cheek 2011 Dr. Tonia Brooms  . PREMATURE ATRIAL CONTRACTIONS 09/21/2010  . Thyroid nodule   . Cataract     floaters  . Pneumonia 07/2011    took abx for several weeks  . IBS (irritable bowel syndrome)   . Collagenous colitis    Past Surgical History  Procedure Laterality Date  . Total knee arthroplasty    . Breast lumpectomy Left 2001  . Abdominal hysterectomy    . Appendectomy  1972  . Mastectomy, partial Left   . Carpal tunnel release    . Total knee arthroplasty Right 2010    Dr Para March  . Cataract extraction    . Skin cancer excision      face-Dr. Bing Plume  . Xrt      chemo, surgery  . Eye surgery      cataract ext  . Total knee arthroplasty  10/30/2011    Procedure: TOTAL KNEE ARTHROPLASTY;  Surgeon: Lorn Junes, MD;  Location: [place];  Service: Orthopedics;  Laterality: Left;     . Hernia repair      reports that she quit smoking about 32 years ago. She has never used smokeless tobacco. She reports that  drinks alcohol. She reports that she does not use illicit drugs. family history includes Alcohol abuse in her father; Alzheimer's disease in her mother; Dementia in her mother; Diabetes in her other; Heart attack in her father; Heart disease in her mother; Hypertension in her mother; Mental retardation in her mother; and Prostate cancer in her brother.  There is no history of Colon cancer and Anesthesia problems. Allergies  Allergen Reactions  . Cefuroxime Diarrhea  . Celecoxib Nausea Only  . Codeine Nausea Only  . Ranitidine     bloating  . Tramadol Hcl Nausea Only   Current Outpatient Prescriptions on File Prior to Visit  Medication Sig Dispense Refill  . ALPRAZolam (XANAX) 0.25 MG tablet Take 1-2 tablets (0.25-0.5 mg total) by mouth 3 (three) times daily as needed for sleep.  90 tablet  3  . aspirin 325 MG tablet Take 325 mg by mouth daily.      . Calcium Carbonate-Vitamin D (CALCIUM 600+D) 600-400 MG-UNIT per tablet Take 1 tablet by mouth daily.      . carvedilol (COREG) 12.5 MG tablet Take 1 tablet (12.5  mg total) by mouth 2 (two) times daily with a meal.  180 tablet  3  . Fluticasone-Salmeterol (ADVAIR DISKUS) 250-50 MCG/DOSE AEPB Inhale 1 puff into the lungs 2 (two) times daily.  1 each  3  . furosemide (LASIX) 20 MG tablet Take 0.5 tablets (10 mg total) by mouth every other day.  15 tablet  6  . losartan (COZAAR) 100 MG tablet Take 1 tablet (100 mg total) by mouth daily.  90 tablet  3  . Multiple Vitamins-Minerals (MULTIVITAMIN PO) Take 1 each by mouth daily.      . Pancrelipase, Lip-Prot-Amyl, (CREON) 24000 UNITS CPEP 1 po tid w/larger meals  90 capsule  3   No current facility-administered medications on file prior to visit.   BP 162/78  Pulse 76  Temp(Src) 98.8 F (37.1 C) (Oral)  Resp 16   Review of Systems  Constitutional: Negative for activity change, appetite  change, fatigue and unexpected weight change.  HENT: Positive for congestion. Negative for mouth sores and sinus pressure.   Eyes: Negative for visual disturbance.  Respiratory: Negative for chest tightness.   Gastrointestinal: Negative for nausea.  Genitourinary: Negative for frequency, difficulty urinating and vaginal pain.  Musculoskeletal: Positive for joint swelling (L knee hurts) and gait problem. Negative for back pain.  Skin: Negative for pallor.  Neurological: Negative for dizziness, tremors, weakness and numbness.  Psychiatric/Behavioral: Negative for suicidal ideas, confusion and sleep disturbance. The patient is not nervous/anxious.        Objective:   Physical Exam  Constitutional: She appears well-developed and well-nourished. No distress.  HENT:  Head: Normocephalic.  Right Ear: External ear normal.  Left Ear: External ear normal.  Nose: Nose normal.  Nasal mucosa is swollen  Eyes: Conjunctivae are normal. Pupils are equal, round, and reactive to light. Right eye exhibits no discharge. Left eye exhibits no discharge.  Neck: Normal range of motion. Neck supple. No JVD present. No tracheal deviation present. No thyromegaly present.  Cardiovascular: Normal rate, regular rhythm and normal heart sounds.   Pulmonary/Chest: No stridor. No respiratory distress. She has no wheezes.  Abdominal: Soft. Bowel sounds are normal. She exhibits no distension and no mass. There is no tenderness. There is no rebound and no guarding.  Musculoskeletal: She exhibits tenderness (L knee). She exhibits no edema.  Lymphadenopathy:    She has no cervical adenopathy.  Neurological: She displays normal reflexes. No cranial nerve deficit. She exhibits normal muscle tone. Coordination normal.  Skin: No rash noted. No erythema.  Psychiatric: She has a normal mood and affect. Her behavior is normal. Judgment and thought content normal.  L knee w/a post-op scar R wax R leg wound - cleaned, dead skin  was removed  Lab Results  Component Value Date   WBC 5.3 03/05/2013   HGB 12.5 03/05/2013   HCT 37.2 03/05/2013   PLT 245.0 03/05/2013   GLUCOSE 80 03/05/2013   GLUCOSE 80 03/05/2013   CHOL 172 03/23/2011   TRIG 78.0 03/23/2011   HDL 63.80 03/23/2011   LDLCALC 93 03/23/2011   ALT 22 03/05/2013   AST 29 03/05/2013   NA 139 03/05/2013   NA 139 03/05/2013   K 4.0 03/05/2013   K 4.0 03/05/2013   CL 103 03/05/2013   CL 103 03/05/2013   CREATININE 1.1 03/05/2013   CREATININE 1.1 03/05/2013   BUN 18 03/05/2013   BUN 18 03/05/2013   CO2 30 03/05/2013   CO2 30 03/05/2013   TSH 1.47 08/07/2012  INR 0.95 10/25/2011   HGBA1C 5.6 09/08/2009   MICROALBUR 0.6 03/23/2011     Procedure Note :     Procedure :  Ear irrigation   Indication:  Cerumen impaction R   Risks, including pain, dizziness, eardrum perforation, bleeding, infection and others as well as benefits were explained to the patient in detail. Verbal consent was obtained and the patient agreed to proceed.    We used "The Elephant Ear Irrigation Device" filled with lukewarm water for irrigation. A large amount wax was recovered. Procedure has also required manual wax removal with an ear loop.   Tolerated well. Complications: None.   Postprocedure instructions :  Call if problems.      Assessment & Plan:

## 2013-03-12 NOTE — Assessment & Plan Note (Signed)
Resolved

## 2013-03-13 DIAGNOSIS — Z1231 Encounter for screening mammogram for malignant neoplasm of breast: Secondary | ICD-10-CM | POA: Diagnosis not present

## 2013-04-03 ENCOUNTER — Ambulatory Visit (INDEPENDENT_AMBULATORY_CARE_PROVIDER_SITE_OTHER): Payer: Medicare Other | Admitting: Internal Medicine

## 2013-04-03 ENCOUNTER — Encounter: Payer: Self-pay | Admitting: Internal Medicine

## 2013-04-03 DIAGNOSIS — I10 Essential (primary) hypertension: Secondary | ICD-10-CM | POA: Diagnosis not present

## 2013-04-03 DIAGNOSIS — S91009A Unspecified open wound, unspecified ankle, initial encounter: Secondary | ICD-10-CM | POA: Diagnosis not present

## 2013-04-03 DIAGNOSIS — R197 Diarrhea, unspecified: Secondary | ICD-10-CM

## 2013-04-03 DIAGNOSIS — S81009A Unspecified open wound, unspecified knee, initial encounter: Secondary | ICD-10-CM | POA: Diagnosis not present

## 2013-04-03 NOTE — Assessment & Plan Note (Signed)
Resolved

## 2013-04-03 NOTE — Progress Notes (Signed)
Subjective:    Laceration    C/o wound on L lower leg - injured w/a branch again < 1 week ago  F/u diarhea - resolved on steroids.  F/u fatigue, not sleeping well  The patient presents for a follow-up of  chronic hypertension, chronic dyslipidemia, knee OA - not controlled with medicines. No wt loss. BP is nl at home.    Wt Readings from Last 3 Encounters:  02/26/13 136 lb (61.689 kg)  02/07/13 137 lb (62.143 kg)  01/21/13 136 lb (61.689 kg)   BP Readings from Last 3 Encounters:  04/03/13 150/80  03/12/13 162/78  02/26/13 148/82      Past Medical History  Diagnosis Date  . GOITER, MULTINODULAR 05/06/2010    Dr [name]  . HYPERTHYROIDISM 05/23/2010  . HYPERLIPIDEMIA 08/20/2007  . ANEMIA-NOS 08/20/2007  . ANXIETY 03/16/2007  . CARPAL TUNNEL SYNDROME, RIGHT 01/11/2008  . HYPERTENSION 03/16/2007  . VENOUS INSUFFICIENCY 09/05/2007  . DIVERTICULOSIS, COLON 08/20/2007  . CELLULITIS, LEG, RIGHT 08/20/2007  . OSTEOARTHRITIS 03/16/2007  . OSTEOPOROSIS 08/20/2007  . SCOLIOSIS 01/11/2008  . BREAST CANCER, HX OF 03/16/2007  . SKIN CANCER, HX OF 11/12/2008     R cheek 2010, L Cheek 2011 Dr. Tonia Brooms  . PREMATURE ATRIAL CONTRACTIONS 09/21/2010  . Thyroid nodule   . Cataract     floaters  . Pneumonia 07/2011    took abx for several weeks  . IBS (irritable bowel syndrome)   . Collagenous colitis    Past Surgical History  Procedure Laterality Date  . Total knee arthroplasty    . Breast lumpectomy Left 2001  . Abdominal hysterectomy    . Appendectomy  1972  . Mastectomy, partial Left   . Carpal tunnel release    . Total knee arthroplasty Right 2010    Dr Para March  . Cataract extraction    . Skin cancer excision      face-Dr. Bing Plume  . Xrt      chemo, surgery  . Eye surgery      cataract ext  . Total knee arthroplasty  10/30/2011    Procedure: TOTAL KNEE ARTHROPLASTY;  Surgeon: Lorn Junes, MD;  Location: [facility];  Service: Orthopedics;  Laterality: Left;     . Hernia  repair      reports that she quit smoking about 32 years ago. She has never used smokeless tobacco. She reports that  drinks alcohol. She reports that she does not use illicit drugs. family history includes Alcohol abuse in her father; Alzheimer's disease in her mother; Dementia in her mother; Diabetes in her other; Heart attack in her father; Heart disease in her mother; Hypertension in her mother; Mental retardation in her mother; Prostate cancer in her brother. There is no history of Colon cancer or Anesthesia problems. Allergies  Allergen Reactions  . Cefuroxime Diarrhea  . Celecoxib Nausea Only  . Codeine Nausea Only  . Ranitidine     bloating  . Tramadol Hcl Nausea Only   Current Outpatient Prescriptions on File Prior to Visit  Medication Sig Dispense Refill  . ALPRAZolam (XANAX) 0.25 MG tablet Take 1-2 tablets (0.25-0.5 mg total) by mouth 3 (three) times daily as needed for sleep.  90 tablet  3  . aspirin 325 MG tablet Take 325 mg by mouth daily.      . Calcium Carbonate-Vitamin D (CALCIUM 600+D) 600-400 MG-UNIT per tablet Take 1 tablet by mouth daily.      . carvedilol (COREG) 12.5 MG tablet Take 1  tablet (12.5 mg total) by mouth 2 (two) times daily with a meal.  180 tablet  3  . Fluticasone-Salmeterol (ADVAIR DISKUS) 250-50 MCG/DOSE AEPB Inhale 1 puff into the lungs 2 (two) times daily.  1 each  3  . furosemide (LASIX) 20 MG tablet Take 0.5 tablets (10 mg total) by mouth every other day.  15 tablet  6  . losartan (COZAAR) 100 MG tablet Take 1 tablet (100 mg total) by mouth daily.  90 tablet  3  . Multiple Vitamins-Minerals (MULTIVITAMIN PO) Take 1 each by mouth daily.      . mupirocin ointment (BACTROBAN) 2 % Use qd or bid  15 g  0  . Pancrelipase, Lip-Prot-Amyl, (CREON) 24000 UNITS CPEP 1 po tid w/larger meals  90 capsule  3  . sulfamethoxazole-trimethoprim (BACTRIM DS,SEPTRA DS) 800-160 MG per tablet Take 1 tablet by mouth 2 (two) times daily.  10 tablet  0   No current  facility-administered medications on file prior to visit.   BP 150/80  Pulse 72  Temp(Src) 97.3 F (36.3 C) (Oral)  Resp 16   Review of Systems  Constitutional: Negative for activity change, appetite change, fatigue and unexpected weight change.  HENT: Positive for congestion. Negative for mouth sores and sinus pressure.   Eyes: Negative for visual disturbance.  Respiratory: Negative for chest tightness.   Gastrointestinal: Negative for nausea.  Genitourinary: Negative for frequency, difficulty urinating and vaginal pain.  Musculoskeletal: Positive for joint swelling (L knee hurts) and gait problem. Negative for back pain.  Skin: Negative for pallor.  Neurological: Negative for dizziness, tremors, weakness and numbness.  Psychiatric/Behavioral: Negative for suicidal ideas, confusion and sleep disturbance. The patient is not nervous/anxious.        Objective:   Physical Exam  Constitutional: She appears well-developed and well-nourished. No distress.  HENT:  Head: Normocephalic.  Right Ear: External ear normal.  Left Ear: External ear normal.  Nose: Nose normal.  Nasal mucosa is swollen  Eyes: Conjunctivae are normal. Pupils are equal, round, and reactive to light. Right eye exhibits no discharge. Left eye exhibits no discharge.  Neck: Normal range of motion. Neck supple. No JVD present. No tracheal deviation present. No thyromegaly present.  Cardiovascular: Normal rate, regular rhythm and normal heart sounds.   Pulmonary/Chest: No stridor. No respiratory distress. She has no wheezes.  Abdominal: Soft. Bowel sounds are normal. She exhibits no distension and no mass. There is no tenderness. There is no rebound and no guarding.  Musculoskeletal: She exhibits tenderness (L knee). She exhibits no edema.  Lymphadenopathy:    She has no cervical adenopathy.  Neurological: She displays normal reflexes. No cranial nerve deficit. She exhibits normal muscle tone. Coordination normal.   Skin: No rash noted. No erythema.  Psychiatric: She has a normal mood and affect. Her behavior is normal. Judgment and thought content normal.  L knee w/a post-op scar L leg wound V shaped 1x3 cm - clean; dressed  Lab Results  Component Value Date   WBC 5.3 03/05/2013   HGB 12.5 03/05/2013   HCT 37.2 03/05/2013   PLT 245.0 03/05/2013   GLUCOSE 80 03/05/2013   GLUCOSE 80 03/05/2013   CHOL 172 03/23/2011   TRIG 78.0 03/23/2011   HDL 63.80 03/23/2011   LDLCALC 93 03/23/2011   ALT 22 03/05/2013   AST 29 03/05/2013   NA 139 03/05/2013   NA 139 03/05/2013   K 4.0 03/05/2013   K 4.0 03/05/2013   CL 103 03/05/2013  CL 103 03/05/2013   CREATININE 1.1 03/05/2013   CREATININE 1.1 03/05/2013   BUN 18 03/05/2013   BUN 18 03/05/2013   CO2 30 03/05/2013   CO2 30 03/05/2013   TSH 1.47 08/07/2012   INR 0.95 10/25/2011   HGBA1C 5.6 09/08/2009   MICROALBUR 0.6 03/23/2011         Assessment & Plan:

## 2013-04-03 NOTE — Assessment & Plan Note (Signed)
Check BP at home

## 2013-04-03 NOTE — Assessment & Plan Note (Signed)
L lower leg 8/14 Dressed

## 2013-05-01 DIAGNOSIS — Z23 Encounter for immunization: Secondary | ICD-10-CM | POA: Diagnosis not present

## 2013-05-30 ENCOUNTER — Ambulatory Visit (INDEPENDENT_AMBULATORY_CARE_PROVIDER_SITE_OTHER): Payer: Medicare Other | Admitting: Internal Medicine

## 2013-05-30 ENCOUNTER — Other Ambulatory Visit (INDEPENDENT_AMBULATORY_CARE_PROVIDER_SITE_OTHER): Payer: Medicare Other

## 2013-05-30 ENCOUNTER — Encounter: Payer: Self-pay | Admitting: Internal Medicine

## 2013-05-30 VITALS — BP 138/76 | HR 78 | Temp 97.6°F | Resp 16 | Ht 63.0 in | Wt 138.0 lb

## 2013-05-30 DIAGNOSIS — E785 Hyperlipidemia, unspecified: Secondary | ICD-10-CM

## 2013-05-30 DIAGNOSIS — T07XXXA Unspecified multiple injuries, initial encounter: Secondary | ICD-10-CM

## 2013-05-30 DIAGNOSIS — L0291 Cutaneous abscess, unspecified: Secondary | ICD-10-CM

## 2013-05-30 DIAGNOSIS — R197 Diarrhea, unspecified: Secondary | ICD-10-CM | POA: Diagnosis not present

## 2013-05-30 DIAGNOSIS — I872 Venous insufficiency (chronic) (peripheral): Secondary | ICD-10-CM

## 2013-05-30 DIAGNOSIS — L0390 Cellulitis, unspecified: Secondary | ICD-10-CM

## 2013-05-30 DIAGNOSIS — T148XXD Other injury of unspecified body region, subsequent encounter: Secondary | ICD-10-CM

## 2013-05-30 LAB — HEPATIC FUNCTION PANEL
ALT: 20 U/L (ref 0–35)
Albumin: 4.2 g/dL (ref 3.5–5.2)
Alkaline Phosphatase: 80 U/L (ref 39–117)
Total Protein: 7 g/dL (ref 6.0–8.3)

## 2013-05-30 LAB — TSH: TSH: 1.59 u[IU]/mL (ref 0.35–5.50)

## 2013-05-30 MED ORDER — MUPIROCIN 2 % EX OINT: TOPICAL_OINTMENT | CUTANEOUS | Status: DC

## 2013-05-30 MED ORDER — SULFAMETHOXAZOLE-TRIMETHOPRIM 800-160 MG PO TABS: 1.0000 | ORAL_TABLET | Freq: Two times a day (BID) | ORAL | Status: DC

## 2013-05-30 NOTE — Progress Notes (Signed)
Subjective:    Patient ID: Mia Contreras, female    DOB: 1929/02/17, 79 y.o.   MRN: [id number]  HPI  Pt presents to the clinic today with c/o increased redness, swelling and pain around a skin tear on her left lower leg. This skin tear occurred 3 weeks ago. She has seen Dr. Alain Marion for the same. He prescribed bactroban ointment to the area BID. She has been cleaning it with warm water and soap. She has not seen any drainage from it but a very thick scab has developed.  Review of Systems      Past Medical History  Diagnosis Date  . GOITER, MULTINODULAR 05/06/2010    Dr [name]  . HYPERTHYROIDISM 05/23/2010  . HYPERLIPIDEMIA 08/20/2007  . ANEMIA-NOS 08/20/2007  . ANXIETY 03/16/2007  . CARPAL TUNNEL SYNDROME, RIGHT 01/11/2008  . HYPERTENSION 03/16/2007  . VENOUS INSUFFICIENCY 09/05/2007  . DIVERTICULOSIS, COLON 08/20/2007  . CELLULITIS, LEG, RIGHT 08/20/2007  . OSTEOARTHRITIS 03/16/2007  . OSTEOPOROSIS 08/20/2007  . SCOLIOSIS 01/11/2008  . BREAST CANCER, HX OF 03/16/2007  . SKIN CANCER, HX OF 11/12/2008     R cheek 2010, L Cheek 2011 Dr. Tonia Brooms  . PREMATURE ATRIAL CONTRACTIONS 09/21/2010  . Thyroid nodule   . Cataract     floaters  . Pneumonia 07/2011    took abx for several weeks  . IBS (irritable bowel syndrome)   . Collagenous colitis     Current Outpatient Prescriptions  Medication Sig Dispense Refill  . ALPRAZolam (XANAX) 0.25 MG tablet Take 1-2 tablets (0.25-0.5 mg total) by mouth 3 (three) times daily as needed for sleep.  90 tablet  3  . aspirin 325 MG tablet Take 325 mg by mouth daily.      . Calcium Carbonate-Vitamin D (CALCIUM 600+D) 600-400 MG-UNIT per tablet Take 1 tablet by mouth daily.      . carvedilol (COREG) 12.5 MG tablet Take 1 tablet (12.5 mg total) by mouth 2 (two) times daily with a meal.  180 tablet  3  . Fluticasone-Salmeterol (ADVAIR DISKUS) 250-50 MCG/DOSE AEPB Inhale 1 puff into the lungs 2 (two) times daily.  1 each  3  . furosemide (LASIX) 20 MG  tablet Take 0.5 tablets (10 mg total) by mouth every other day.  15 tablet  6  . losartan (COZAAR) 100 MG tablet Take 1 tablet (100 mg total) by mouth daily.  90 tablet  3  . Multiple Vitamins-Minerals (MULTIVITAMIN PO) Take 1 each by mouth daily.      . mupirocin ointment (BACTROBAN) 2 % Use qd or bid  15 g  0  . Pancrelipase, Lip-Prot-Amyl, (CREON) 24000 UNITS CPEP 1 po tid w/larger meals  90 capsule  3  . sulfamethoxazole-trimethoprim (BACTRIM DS,SEPTRA DS) 800-160 MG per tablet Take 1 tablet by mouth 2 (two) times daily.  10 tablet  0   No current facility-administered medications for this visit.    Allergies  Allergen Reactions  . Cefuroxime Diarrhea  . Celecoxib Nausea Only  . Codeine Nausea Only  . Ranitidine     bloating  . Tramadol Hcl Nausea Only    Family History  Problem Relation Age of Onset  . Dementia Mother   . Heart disease Mother   . Mental retardation Mother   . Colon cancer Neg Hx   . Hypertension Mother   . Alzheimer's disease Mother   . Prostate cancer Brother   . Diabetes Other   . Heart attack Father   . Alcohol abuse Father   .  Anesthesia problems Neg Hx     History   Social History  . Marital Status: Single    Spouse Name: N/A    Number of Children: N/A  . Years of Education: N/A   Occupational History  . Not on file.   Social History Main Topics  . Smoking status: Former Smoker    Quit date: 12/20/1980  . Smokeless tobacco: Never Used  . Alcohol Use: Yes     Comment: socially  . Drug Use: No  . Sexual Activity: Not Currently   Other Topics Concern  . Not on file   Social History Narrative  . No narrative on file     Constitutional: Denies fever, malaise, fatigue, headache or abrupt weight changes.  Skin: Pt reports skin tear to LLE. Denies redness, rashes, lesions or ulcercations.    No other specific complaints in a complete review of systems (except as listed in HPI above).  Objective:   Physical Exam   BP 138/76   Pulse 78  Temp(Src) 97.6 F (36.4 C) (Oral)  Resp 16  Ht 5\' 3"  (1.6 m)  Wt 138 lb (62.596 kg)  BMI 24.45 kg/m2  SpO2 94% Wt Readings from Last 3 Encounters:  05/30/13 138 lb (62.596 kg)  02/26/13 136 lb (61.689 kg)  02/07/13 137 lb (62.143 kg)    General: Appears their stated age, well developed, well nourished in NAD. Skin: Warm, dry and intact. No rashes, lesions or ulcerations noted. Cellulitis noted around scab on LLE. Warm to touch.  Cardiovascular: Normal rate and rhythm. S1,S2 noted.  No murmur, rubs or gallops noted. No JVD or BLE edema. No carotid bruits noted. Pulmonary/Chest: Normal effort and positive vesicular breath sounds. No respiratory distress. No wheezes, rales or ronchi noted.    BMET    Component Value Date/Time   NA 139 03/05/2013 1034   NA 139 03/05/2013 1034   K 4.0 03/05/2013 1034   K 4.0 03/05/2013 1034   CL 103 03/05/2013 1034   CL 103 03/05/2013 1034   CO2 30 03/05/2013 1034   CO2 30 03/05/2013 1034   GLUCOSE 80 03/05/2013 1034   GLUCOSE 80 03/05/2013 1034   GLUCOSE 101* 08/01/2006 0841   BUN 18 03/05/2013 1034   BUN 18 03/05/2013 1034   CREATININE 1.1 03/05/2013 1034   CREATININE 1.1 03/05/2013 1034   CALCIUM 9.6 03/05/2013 1034   CALCIUM 9.6 03/05/2013 1034   GFRNONAA 42* 11/02/2011 0538   GFRAA 48* 11/02/2011 0538    Lipid Panel     Component Value Date/Time   CHOL 172 03/23/2011 1000   TRIG 78.0 03/23/2011 1000   HDL 63.80 03/23/2011 1000   CHOLHDL 3 03/23/2011 1000   VLDL 15.6 03/23/2011 1000   LDLCALC 93 03/23/2011 1000    CBC    Component Value Date/Time   WBC 5.3 03/05/2013 1034   WBC 6.5 04/30/2008 1453   RBC 4.09 03/05/2013 1034   RBC 4.28 04/30/2008 1453   HGB 12.5 03/05/2013 1034   HGB 10.6* 04/30/2008 1453   HCT 37.2 03/05/2013 1034   HCT 32.9* 04/30/2008 1453   PLT 245.0 03/05/2013 1034   PLT 397 04/30/2008 1453   MCV 91.1 03/05/2013 1034   MCV 76.9* 04/30/2008 1453   MCH 30.9 11/02/2011 0538   MCH 24.9* 04/30/2008 1453   MCHC 33.5 03/05/2013 1034    MCHC 32.3 04/30/2008 1453   RDW 14.9* 03/05/2013 1034   RDW 17.1* 04/30/2008 1453   LYMPHSABS 1.6 03/05/2013 1034  LYMPHSABS 1.9 04/30/2008 1453   MONOABS 0.7 03/05/2013 1034   MONOABS 0.5 04/30/2008 1453   EOSABS 0.3 03/05/2013 1034   EOSABS 0.3 04/30/2008 1453   BASOSABS 0.0 03/05/2013 1034   BASOSABS 0.0 04/30/2008 1453    Hgb A1C Lab Results  Component Value Date   HGBA1C 5.6 09/08/2009        Assessment & Plan:   Cellulitis of slow healing skin tear secondary to PVD:  eRx for Septra BID  X 5 days Continue to keep clean and cover with bactroban and band aid Do not pick the scab off Tylenol for pain  RTC in 2 weeks for your f/u with Dr. Alain Marion

## 2013-05-30 NOTE — Patient Instructions (Signed)
Cellulitis Cellulitis is an infection of the skin and the tissue beneath it. The infected area is usually red and tender. Cellulitis occurs most often in the arms and lower legs.  CAUSES  Cellulitis is caused by bacteria that enter the skin through cracks or cuts in the skin. The most common types of bacteria that cause cellulitis are Staphylococcus and Streptococcus. SYMPTOMS   Redness and warmth.  Swelling.  Tenderness or pain.  Fever. DIAGNOSIS  Your caregiver can usually determine what is wrong based on a physical exam. Blood tests may also be done. TREATMENT  Treatment usually involves taking an antibiotic medicine. HOME CARE INSTRUCTIONS   Take your antibiotics as directed. Finish them even if you start to feel better.  Keep the infected arm or leg elevated to reduce swelling.  Apply a warm cloth to the affected area up to 4 times per day to relieve pain.  Only take over-the-counter or prescription medicines for pain, discomfort, or fever as directed by your caregiver.  Keep all follow-up appointments as directed by your caregiver. SEEK MEDICAL CARE IF:   You notice red streaks coming from the infected area.  Your red area gets larger or turns dark in color.  Your bone or joint underneath the infected area becomes painful after the skin has healed.  Your infection returns in the same area or another area.  You notice a swollen bump in the infected area.  You develop new symptoms. SEEK IMMEDIATE MEDICAL CARE IF:   You have a fever.  You feel very sleepy.  You develop vomiting or diarrhea.  You have a general ill feeling (malaise) with muscle aches and pains. MAKE SURE YOU:   Understand these instructions.  Will watch your condition.  Will get help right away if you are not doing well or get worse. Document Released: 05/17/2005 Document Revised: 02/06/2012 Document Reviewed: 10/23/2011 [employer] Patient Information 2014 [employer].  

## 2013-06-02 LAB — NMR LIPOPROFILE WITHOUT LIPIDS
HDL Size: 9.4 nm (ref >=9.2)
LDL Particle Number: 1323 nmol/L — ABNORMAL HIGH (ref <1000)
LP-IR Score: 32 (ref <=45)
Large HDL-P: 8.8 umol/L (ref >=4.8)
Large VLDL-P: 3.6 nmol/L — ABNORMAL HIGH (ref <=2.7)

## 2013-06-06 ENCOUNTER — Ambulatory Visit: Payer: Medicare Other | Admitting: Internal Medicine

## 2013-06-12 ENCOUNTER — Ambulatory Visit (INDEPENDENT_AMBULATORY_CARE_PROVIDER_SITE_OTHER): Payer: Medicare Other | Admitting: Internal Medicine

## 2013-06-12 ENCOUNTER — Encounter: Payer: Self-pay | Admitting: Internal Medicine

## 2013-06-12 DIAGNOSIS — I509 Heart failure, unspecified: Secondary | ICD-10-CM

## 2013-06-12 DIAGNOSIS — M810 Age-related osteoporosis without current pathological fracture: Secondary | ICD-10-CM

## 2013-06-12 DIAGNOSIS — IMO0002 Reserved for concepts with insufficient information to code with codable children: Secondary | ICD-10-CM

## 2013-06-12 DIAGNOSIS — I10 Essential (primary) hypertension: Secondary | ICD-10-CM

## 2013-06-12 MED ORDER — LOSARTAN POTASSIUM 100 MG PO TABS: 100.0000 mg | ORAL_TABLET | Freq: Every day | ORAL | Status: DC

## 2013-06-12 MED ORDER — CARVEDILOL 12.5 MG PO TABS: 12.5000 mg | ORAL_TABLET | Freq: Two times a day (BID) | ORAL | Status: DC

## 2013-06-12 NOTE — Patient Instructions (Signed)
   Milk free trial (no milk, ice cream, cheese and yogurt) for 4-6 weeks. OK to use almond, coconut, rice or soy milk. "Almond breeze" brand tastes good.  

## 2013-06-12 NOTE — Progress Notes (Signed)
Subjective:    HPI C/o wound on L lower leg - injured w/a branch again < 12 week ago  F/u diarhea - resolved on steroids.  F/u fatigue, not sleeping well  The patient presents for a follow-up of  chronic hypertension, chronic dyslipidemia, knee OA - not controlled with medicines. No wt loss. BP is nl at home.    Wt Readings from Last 3 Encounters:  06/12/13 138 lb (62.596 kg)  05/30/13 138 lb (62.596 kg)  02/26/13 136 lb (61.689 kg)   BP Readings from Last 3 Encounters:  06/12/13 160/90  05/30/13 138/76  04/03/13 150/80      Past Medical History  Diagnosis Date  . GOITER, MULTINODULAR 05/06/2010    Dr [name]  . HYPERTHYROIDISM 05/23/2010  . HYPERLIPIDEMIA 08/20/2007  . ANEMIA-NOS 08/20/2007  . ANXIETY 03/16/2007  . CARPAL TUNNEL SYNDROME, RIGHT 01/11/2008  . HYPERTENSION 03/16/2007  . VENOUS INSUFFICIENCY 09/05/2007  . DIVERTICULOSIS, COLON 08/20/2007  . CELLULITIS, LEG, RIGHT 08/20/2007  . OSTEOARTHRITIS 03/16/2007  . OSTEOPOROSIS 08/20/2007  . SCOLIOSIS 01/11/2008  . BREAST CANCER, HX OF 03/16/2007  . SKIN CANCER, HX OF 11/12/2008     R cheek 2010, L Cheek 2011 Dr. Tonia Brooms  . PREMATURE ATRIAL CONTRACTIONS 09/21/2010  . Thyroid nodule   . Cataract     floaters  . Pneumonia 07/2011    took abx for several weeks  . IBS (irritable bowel syndrome)   . Collagenous colitis    Past Surgical History  Procedure Laterality Date  . Total knee arthroplasty    . Breast lumpectomy Left 2001  . Abdominal hysterectomy    . Appendectomy  1972  . Mastectomy, partial Left   . Carpal tunnel release    . Total knee arthroplasty Right 2010    Dr Para March  . Cataract extraction    . Skin cancer excision      face-Dr. Bing Plume  . Xrt      chemo, surgery  . Eye surgery      cataract ext  . Total knee arthroplasty  10/30/2011    Procedure: TOTAL KNEE ARTHROPLASTY;  Surgeon: Lorn Junes, MD;  Location: [facility];  Service: Orthopedics;  Laterality: Left;     . Hernia repair      reports that she quit smoking about 32 years ago. She has never used smokeless tobacco. She reports that she drinks alcohol. She reports that she does not use illicit drugs. family history includes Alcohol abuse in her father; Alzheimer's disease in her mother; Dementia in her mother; Diabetes in her other; Heart attack in her father; Heart disease in her mother; Hypertension in her mother; Mental retardation in her mother; Prostate cancer in her brother. There is no history of Colon cancer or Anesthesia problems. Allergies  Allergen Reactions  . Cefuroxime Diarrhea  . Celecoxib Nausea Only  . Codeine Nausea Only  . Ranitidine     bloating  . Tramadol Hcl Nausea Only   Current Outpatient Prescriptions on File Prior to Visit  Medication Sig Dispense Refill  . ALPRAZolam (XANAX) 0.25 MG tablet Take 1-2 tablets (0.25-0.5 mg total) by mouth 3 (three) times daily as needed for sleep.  90 tablet  3  . aspirin 325 MG tablet Take 325 mg by mouth daily.      . Calcium Carbonate-Vitamin D (CALCIUM 600+D) 600-400 MG-UNIT per tablet Take 1 tablet by mouth daily.      . carvedilol (COREG) 12.5 MG tablet Take 1 tablet (12.5 mg total)  by mouth 2 (two) times daily with a meal.  180 tablet  3  . Fluticasone-Salmeterol (ADVAIR DISKUS) 250-50 MCG/DOSE AEPB Inhale 1 puff into the lungs 2 (two) times daily.  1 each  3  . furosemide (LASIX) 20 MG tablet Take 0.5 tablets (10 mg total) by mouth every other day.  15 tablet  6  . losartan (COZAAR) 100 MG tablet Take 1 tablet (100 mg total) by mouth daily.  90 tablet  3  . Multiple Vitamins-Minerals (MULTIVITAMIN PO) Take 1 each by mouth daily.      . mupirocin ointment (BACTROBAN) 2 % Use qd or bid  15 g  0  . Pancrelipase, Lip-Prot-Amyl, (CREON) 24000 UNITS CPEP 1 po tid w/larger meals  90 capsule  3  . sulfamethoxazole-trimethoprim (BACTRIM DS,SEPTRA DS) 800-160 MG per tablet Take 1 tablet by mouth 2 (two) times daily.  10 tablet  0   No current  facility-administered medications on file prior to visit.   BP 160/90  Pulse 72  Temp(Src) 98.1 F (36.7 C) (Oral)  Resp 16  Wt 138 lb (62.596 kg)  BMI 24.45 kg/m2   Review of Systems  Constitutional: Negative for activity change, appetite change, fatigue and unexpected weight change.  HENT: Positive for congestion. Negative for mouth sores and sinus pressure.   Eyes: Negative for visual disturbance.  Respiratory: Negative for chest tightness.   Gastrointestinal: Negative for nausea.  Genitourinary: Negative for frequency, difficulty urinating and vaginal pain.  Musculoskeletal: Positive for gait problem and joint swelling (L knee hurts). Negative for back pain.  Skin: Negative for pallor.  Neurological: Negative for dizziness, tremors, weakness and numbness.  Psychiatric/Behavioral: Negative for suicidal ideas, confusion and sleep disturbance. The patient is not nervous/anxious.        Objective:   Physical Exam  Constitutional: She appears well-developed and well-nourished. No distress.  HENT:  Head: Normocephalic.  Right Ear: External ear normal.  Left Ear: External ear normal.  Nose: Nose normal.  Nasal mucosa is swollen  Eyes: Conjunctivae are normal. Pupils are equal, round, and reactive to light. Right eye exhibits no discharge. Left eye exhibits no discharge.  Neck: Normal range of motion. Neck supple. No JVD present. No tracheal deviation present. No thyromegaly present.  Cardiovascular: Normal rate, regular rhythm and normal heart sounds.   Pulmonary/Chest: No stridor. No respiratory distress. She has no wheezes.  Abdominal: Soft. Bowel sounds are normal. She exhibits no distension and no mass. There is no tenderness. There is no rebound and no guarding.  Musculoskeletal: She exhibits tenderness (L knee). She exhibits no edema.  Lymphadenopathy:    She has no cervical adenopathy.  Neurological: She displays normal reflexes. No cranial nerve deficit. She exhibits  normal muscle tone. Coordination normal.  Skin: No rash noted. No erythema.  Psychiatric: She has a normal mood and affect. Her behavior is normal. Judgment and thought content normal.  L knee w/a post-op scar L leg wound V shaped 1x3 cm - clean; dressed  Lab Results  Component Value Date   WBC 5.3 03/05/2013   HGB 12.5 03/05/2013   HCT 37.2 03/05/2013   PLT 245.0 03/05/2013   GLUCOSE 80 03/05/2013   GLUCOSE 80 03/05/2013   CHOL 172 03/23/2011   TRIG 78.0 03/23/2011   HDL 63.80 03/23/2011   LDLCALC 93 03/23/2011   ALT 20 05/30/2013   AST 28 05/30/2013   NA 139 03/05/2013   NA 139 03/05/2013   K 4.0 03/05/2013   K 4.0 03/05/2013  CL 103 03/05/2013   CL 103 03/05/2013   CREATININE 1.1 03/05/2013   CREATININE 1.1 03/05/2013   BUN 18 03/05/2013   BUN 18 03/05/2013   CO2 30 03/05/2013   CO2 30 03/05/2013   TSH 1.59 05/30/2013   INR 0.95 10/25/2011   HGBA1C 5.6 09/08/2009   MICROALBUR 0.6 03/23/2011         Assessment & Plan:

## 2013-06-13 ENCOUNTER — Ambulatory Visit: Payer: Medicare Other | Admitting: Internal Medicine

## 2013-06-19 DIAGNOSIS — D0439 Carcinoma in situ of skin of other parts of face: Secondary | ICD-10-CM | POA: Diagnosis not present

## 2013-06-19 NOTE — Assessment & Plan Note (Signed)
Vit D Strength exercises

## 2013-06-19 NOTE — Assessment & Plan Note (Signed)
Continue with current prescription therapy as reflected on the Med list.  

## 2013-06-19 NOTE — Assessment & Plan Note (Signed)
Healing

## 2013-07-24 ENCOUNTER — Other Ambulatory Visit: Payer: Self-pay | Admitting: *Deleted

## 2013-07-24 ENCOUNTER — Encounter: Payer: Self-pay | Admitting: Cardiovascular Disease

## 2013-07-24 ENCOUNTER — Ambulatory Visit (INDEPENDENT_AMBULATORY_CARE_PROVIDER_SITE_OTHER): Payer: Medicare Other | Admitting: Cardiovascular Disease

## 2013-07-24 VITALS — BP 142/76 | HR 56 | Ht 62.0 in | Wt 136.0 lb

## 2013-07-24 DIAGNOSIS — I509 Heart failure, unspecified: Secondary | ICD-10-CM | POA: Diagnosis not present

## 2013-07-24 DIAGNOSIS — R0600 Dyspnea, unspecified: Secondary | ICD-10-CM

## 2013-07-24 DIAGNOSIS — R609 Edema, unspecified: Secondary | ICD-10-CM

## 2013-07-24 DIAGNOSIS — R0609 Other forms of dyspnea: Secondary | ICD-10-CM | POA: Diagnosis not present

## 2013-07-24 DIAGNOSIS — Z79899 Other long term (current) drug therapy: Secondary | ICD-10-CM

## 2013-07-24 DIAGNOSIS — J449 Chronic obstructive pulmonary disease, unspecified: Secondary | ICD-10-CM

## 2013-07-24 LAB — BASIC METABOLIC PANEL
CO2: 31 mEq/L (ref 19–32)
Calcium: 9.3 mg/dL (ref 8.4–10.5)
Chloride: 103 mEq/L (ref 96–112)
GFR: 53.91 mL/min — ABNORMAL LOW (ref >60.00)
Sodium: 140 mEq/L (ref 135–145)

## 2013-07-24 LAB — BRAIN NATRIURETIC PEPTIDE: Pro B Natriuretic peptide (BNP): 1473 pg/mL — ABNORMAL HIGH (ref 0.0–100.0)

## 2013-07-24 MED ORDER — POTASSIUM CHLORIDE ER 10 MEQ PO TBCR: 10.0000 meq | EXTENDED_RELEASE_TABLET | Freq: Every day | ORAL | Status: DC

## 2013-07-24 MED ORDER — FUROSEMIDE 20 MG PO TABS
20.0000 mg | ORAL_TABLET | ORAL | Status: DC
Start: 2013-07-24 — End: 2013-07-25

## 2013-07-24 NOTE — Assessment & Plan Note (Signed)
Stabel no afib  No palpitations

## 2013-07-24 NOTE — Addendum Note (Signed)
Addended by: Eulis Foster on: 07/24/2013 12:01 PM   Modules accepted: Orders

## 2013-07-24 NOTE — Patient Instructions (Addendum)
Your physician wants you to follow-up in:  [place] will receive a reminder letter in the mail two months in advance. If you don't receive a letter, please call our office to schedule the follow-up appointment.  Your physician recommends that you continue on your current medications as directed. Please refer to the Current Medication list given to you today. Your physician recommends that you return for lab work in:  TODAY  BMET  BNP

## 2013-07-24 NOTE — Progress Notes (Signed)
Patient ID: Mia Contreras, female   DOB: Mar 18, 1929, 79 y.o.   MRN: [id number] 79 yo referred for irregular heart beat. Chronic asymptomatic. Noted as far back as 2010 before knee surgery. Actually has PAC;s with multifocal origin. No palpitations, dsypnea, SSCP or syncope. No afib and no TIA. Still working 3 days/week at [employer]. Reviewed dobutamine echo from 2010 and normal with multifocal PAC/MAT. Has HTN Rx with calcium blocker. Not on beta blocker. Multinodular goiter with normal thyroid labs this year. Denies SSCP, dyspnea, neuro symptoms,  Had echo 04/26/11 EF 35% with diffuse hypokinesis Carotid same day also reviewed 0-39% bilateral disease.  She is asymptomatic. Had XRT and chemo a long time ago for her breast CA.  Reviewed myovue from 05/18/11 EF 32% no ischemia  Echo 01/16/13  Study Conclusions  - Left ventricle: The cavity size was mildly dilated. Wall thickness was normal. The estimated ejection fraction was 20%. Diffuse hypokinesis. Findings consistent with left ventricular diastolic dysfunction. - Aortic valve: Sclerosis without stenosis. No significant regurgitation. - Mitral valve: Mild regurgitation. - Left atrium: The atrium was mildly to moderately dilated. - Right ventricle: The cavity size was normal. Systolic function was mildly reduced by visual assessment. - Pulmonary arteries: PA peak pressure: 52mm Hg (S).  Spoke with brother Pilar Plate today 1 [phone]  Some increased dyspnea but no edema PND or orthopnea. Compliant with meds. Weight consistantly 136-138    ROS: Denies fever, malais, weight loss, blurry vision, decreased visual acuity, cough, sputum, SOB, hemoptysis, pleuritic pain, palpitaitons, heartburn, abdominal pain, melena, lower extremity edema, claudication, or rash.  All other systems reviewed and negative  General: Affect appropriate Healthy:  appears stated age 62: normal Neck supple with no adenopathy JVP normal no bruits no thyromegaly Lungs  clear with no wheezing and good diaphragmatic motion Heart:  S1/S2 no murmur, no rub, gallop or click PMI enlarged  Abdomen: benighn, BS positve, no tenderness, no AAA no bruit.  No HSM or HJR Distal pulses intact with no bruits No edema Neuro non-focal Skin warm and dry No muscular weakness   Current Outpatient Prescriptions  Medication Sig Dispense Refill  . ALPRAZolam (XANAX) 0.25 MG tablet Take 1-2 tablets (0.25-0.5 mg total) by mouth 3 (three) times daily as needed for sleep.  90 tablet  3  . aspirin 325 MG tablet Take 325 mg by mouth daily.      . Calcium Carbonate-Vitamin D (CALCIUM 600+D) 600-400 MG-UNIT per tablet Take 1 tablet by mouth daily.      . carvedilol (COREG) 12.5 MG tablet Take 1 tablet (12.5 mg total) by mouth 2 (two) times daily with a meal.  180 tablet  3  . Fluticasone-Salmeterol (ADVAIR DISKUS) 250-50 MCG/DOSE AEPB Inhale 1 puff into the lungs 2 (two) times daily.  1 each  3  . furosemide (LASIX) 20 MG tablet Take 0.5 tablets (10 mg total) by mouth every other day.  15 tablet  6  . losartan (COZAAR) 100 MG tablet Take 1 tablet (100 mg total) by mouth daily.  90 tablet  3  . Multiple Vitamins-Minerals (MULTIVITAMIN PO) Take 1 each by mouth daily.       No current facility-administered medications for this visit.    Allergies  Cefuroxime; Celecoxib; Codeine; Ranitidine; and Tramadol hcl  Electrocardiogram:  SR rate 69  First degree block non specific ST/T wave changes   Assessment and Plan

## 2013-07-24 NOTE — Assessment & Plan Note (Signed)
Does not look volume overloaded.  Weight stable Compliant with meds. QRS narrow and would not likely benefit from BiV.  Check BNP and BMET and adjust meds if needed

## 2013-07-24 NOTE — Assessment & Plan Note (Signed)
Previous smoker No acitve wheezing  Some of her dyspnea related to lung disease not just CHF

## 2013-07-25 ENCOUNTER — Other Ambulatory Visit: Payer: Self-pay

## 2013-07-25 ENCOUNTER — Telehealth: Payer: Self-pay

## 2013-07-25 DIAGNOSIS — I509 Heart failure, unspecified: Secondary | ICD-10-CM

## 2013-07-25 MED ORDER — FUROSEMIDE 20 MG PO TABS: ORAL_TABLET | ORAL | Status: DC

## 2013-07-25 NOTE — Telephone Encounter (Signed)
refill 

## 2013-07-25 NOTE — Telephone Encounter (Signed)
Patient called to verify her lasix 20 mg. She said that the pharm stated that it was every other day, but refill was sent in wrong it should have been 20 mg every day

## 2013-08-07 ENCOUNTER — Ambulatory Visit: Payer: Medicare Other | Admitting: Endocrinology

## 2013-08-08 ENCOUNTER — Encounter: Payer: Self-pay | Admitting: Endocrinology

## 2013-08-08 ENCOUNTER — Ambulatory Visit (INDEPENDENT_AMBULATORY_CARE_PROVIDER_SITE_OTHER): Payer: Medicare Other | Admitting: Endocrinology

## 2013-08-08 VITALS — BP 126/64 | HR 68 | Temp 98.8°F | Ht 62.0 in | Wt 133.0 lb

## 2013-08-08 DIAGNOSIS — E042 Nontoxic multinodular goiter: Secondary | ICD-10-CM

## 2013-08-08 NOTE — Progress Notes (Signed)
Subjective:    Patient ID: Mia Contreras, female    DOB: 18-Sep-1928, 79 y.o.   MRN: [id number]  HPI In 2011, pt had i-131 rx for hyperthyroidism, due to a large multinodular goiter.  She did not need bx.  she feels as though the goiter is slightly smaller.  pt states she feels well in general.  Past Medical History  Diagnosis Date  . GOITER, MULTINODULAR 05/06/2010    Dr [name]  . HYPERTHYROIDISM 05/23/2010  . HYPERLIPIDEMIA 08/20/2007  . ANEMIA-NOS 08/20/2007  . ANXIETY 03/16/2007  . CARPAL TUNNEL SYNDROME, RIGHT 01/11/2008  . HYPERTENSION 03/16/2007  . VENOUS INSUFFICIENCY 09/05/2007  . DIVERTICULOSIS, COLON 08/20/2007  . CELLULITIS, LEG, RIGHT 08/20/2007  . OSTEOARTHRITIS 03/16/2007  . OSTEOPOROSIS 08/20/2007  . SCOLIOSIS 01/11/2008  . BREAST CANCER, HX OF 03/16/2007  . SKIN CANCER, HX OF 11/12/2008     R cheek 2010, L Cheek 2011 Dr. Tonia Brooms  . PREMATURE ATRIAL CONTRACTIONS 09/21/2010  . Thyroid nodule   . Cataract     floaters  . Pneumonia 07/2011    took abx for several weeks  . IBS (irritable bowel syndrome)   . Collagenous colitis     Past Surgical History  Procedure Laterality Date  . Total knee arthroplasty    . Breast lumpectomy Left 2001  . Abdominal hysterectomy    . Appendectomy  1972  . Mastectomy, partial Left   . Carpal tunnel release    . Total knee arthroplasty Right 2010    Dr Para March  . Cataract extraction    . Skin cancer excision      face-Dr. Bing Plume  . Xrt      chemo, surgery  . Eye surgery      cataract ext  . Total knee arthroplasty  10/30/2011    Procedure: TOTAL KNEE ARTHROPLASTY;  Surgeon: Lorn Junes, MD;  Location: [facility];  Service: Orthopedics;  Laterality: Left;     . Hernia repair      History   Social History  . Marital Status: Single    Spouse Name: N/A    Number of Children: N/A  . Years of Education: N/A   Occupational History  . Not on file.   Social History Main Topics  . Smoking status: Former Smoker    Quit date:  12/20/1980  . Smokeless tobacco: Never Used  . Alcohol Use: Yes     Comment: socially  . Drug Use: No  . Sexual Activity: Not Currently   Other Topics Concern  . Not on file   Social History Narrative  . No narrative on file    Current Outpatient Prescriptions on File Prior to Visit  Medication Sig Dispense Refill  . ALPRAZolam (XANAX) 0.25 MG tablet Take 1-2 tablets (0.25-0.5 mg total) by mouth 3 (three) times daily as needed for sleep.  90 tablet  3  . aspirin 325 MG tablet Take 325 mg by mouth daily.      . Calcium Carbonate-Vitamin D (CALCIUM 600+D) 600-400 MG-UNIT per tablet Take 1 tablet by mouth daily.      . carvedilol (COREG) 12.5 MG tablet Take 1 tablet (12.5 mg total) by mouth 2 (two) times daily with a meal.  180 tablet  3  . Fluticasone-Salmeterol (ADVAIR DISKUS) 250-50 MCG/DOSE AEPB Inhale 1 puff into the lungs 2 (two) times daily.  1 each  3  . furosemide (LASIX) 20 MG tablet Take one  tablet every day  90 tablet  4  .  losartan (COZAAR) 100 MG tablet Take 1 tablet (100 mg total) by mouth daily.  90 tablet  3  . Multiple Vitamins-Minerals (MULTIVITAMIN PO) Take 1 each by mouth daily.      . potassium chloride (K-DUR) 10 MEQ tablet Take 1 tablet (10 mEq total) by mouth daily.  30 tablet  11   No current facility-administered medications on file prior to visit.    Allergies  Allergen Reactions  . Cefuroxime Diarrhea  . Celecoxib Nausea Only  . Codeine Nausea Only  . Ranitidine     bloating  . Tramadol Hcl Nausea Only   Family History  Problem Relation Age of Onset  . Dementia Mother   . Heart disease Mother   . Mental retardation Mother   . Colon cancer Neg Hx   . Hypertension Mother   . Alzheimer's disease Mother   . Prostate cancer Brother   . Diabetes Other   . Heart attack Father   . Alcohol abuse Father   . Anesthesia problems Neg Hx    BP 126/64  Pulse 68  Temp(Src) 98.8 F (37.1 C) (Oral)  Ht 5\' 2"  (1.575 m)  Wt 133 lb (60.328 kg)  BMI  24.32 kg/m2  SpO2 95%  Review of Systems Denies weight change    Objective:   Physical Exam VITAL SIGNS:  See vs page GENERAL: no distress Thyroid is 10x normal size, left > right.   Lab Results  Component Value Date   TSH 1.59 05/30/2013      Assessment & Plan:  Large multinodular goiter, clinically unchanged. Hyperthyroidism: resolved with i-131 rx, but she is at risk for recurrence.

## 2013-08-08 NOTE — Patient Instructions (Signed)
Let's recheck the ultrasound.  you will receive a phone call, about a day and time for an appointment. Please return in 1 year. most of the time, a "lumpy thyroid" will eventually become overactive again.  this is usually a slow process, happening over the span of many years.

## 2013-08-18 ENCOUNTER — Other Ambulatory Visit: Payer: Self-pay | Admitting: Internal Medicine

## 2013-08-20 ENCOUNTER — Other Ambulatory Visit: Payer: Self-pay | Admitting: Internal Medicine

## 2013-08-22 ENCOUNTER — Encounter: Payer: Self-pay | Admitting: Physician Assistant

## 2013-08-22 ENCOUNTER — Ambulatory Visit (INDEPENDENT_AMBULATORY_CARE_PROVIDER_SITE_OTHER): Payer: Medicare Other | Admitting: Physician Assistant

## 2013-08-22 VITALS — BP 140/82 | HR 68 | Ht 62.0 in | Wt 137.0 lb

## 2013-08-22 DIAGNOSIS — R0602 Shortness of breath: Secondary | ICD-10-CM | POA: Diagnosis not present

## 2013-08-22 DIAGNOSIS — I428 Other cardiomyopathies: Secondary | ICD-10-CM | POA: Diagnosis not present

## 2013-08-22 DIAGNOSIS — I10 Essential (primary) hypertension: Secondary | ICD-10-CM

## 2013-08-22 DIAGNOSIS — I5022 Chronic systolic (congestive) heart failure: Secondary | ICD-10-CM | POA: Diagnosis not present

## 2013-08-22 DIAGNOSIS — J449 Chronic obstructive pulmonary disease, unspecified: Secondary | ICD-10-CM

## 2013-08-22 LAB — BASIC METABOLIC PANEL
BUN: 18 mg/dL (ref 6–23)
CALCIUM: 9.6 mg/dL (ref 8.4–10.5)
CO2: 33 meq/L — AB (ref 19–32)
Chloride: 103 mEq/L (ref 96–112)
Creatinine, Ser: 1 mg/dL (ref 0.4–1.2)
GFR: 58.41 mL/min — AB (ref >60.00)
Glucose, Bld: 104 mg/dL — ABNORMAL HIGH (ref 70–99)
POTASSIUM: 4.6 meq/L (ref 3.5–5.1)
SODIUM: 142 meq/L (ref 135–145)

## 2013-08-22 LAB — BRAIN NATRIURETIC PEPTIDE: Pro B Natriuretic peptide (BNP): 1430 pg/mL — ABNORMAL HIGH (ref 0.0–100.0)

## 2013-08-22 MED ORDER — FLUTICASONE-SALMETEROL 250-50 MCG/DOSE IN AEPB: 1.0000 | INHALATION_SPRAY | Freq: Two times a day (BID) | RESPIRATORY_TRACT | Status: DC

## 2013-08-22 NOTE — Progress Notes (Signed)
[facility], [place], [place]  [id number] Phone: [phone] Fax:  [phone]  Date:  08/22/2013   ID:  Mia Contreras, DOB 1928/12/25, MRN [id number]  PCP:  Walker Kehr, MD  Cardiologist:  Dr. Jenkins Rouge     History of Present Illness: Mia Contreras is a 80 y.o. female with a history of nonischemic cardiomyopathy, systolic CHF, PACs, breast cancer.  Lexiscan Myoview (04/2011): No ischemia, EF 32%.  Echocardiogram (12/2012): EF 20%, diffuse HK, diastolic dysfunction, aortic sclerosis without stenosis, mild MR, mild to moderate LAE, mildly reduced RVSF, PASP 38.  She was recently seen in followup by Dr. Johnsie Cancel. She had some increased dyspnea.  BNP was elevated and she was placed on higher dose Lasix and asked to follow up today.  The patient notes dyspnea with exertion. There has really been no significant change. She notes NYHA class 2-2b symptoms. She denies orthopnea, PND or edema. She denies increased abdominal girth. She denies chest discomfort. She denies syncope. She denies cough. She does note occasional wheezing. Of note, she has been out of her Advair for the last 2 months.  Recent Labs: 03/05/2013: Hemoglobin 12.5  05/30/2013: ALT 20; TSH 1.59  07/24/2013: Creatinine 1.0; Potassium 3.9; Pro B Natriuretic peptide (BNP) 1473.0*   Wt Readings from Last 3 Encounters:  08/22/13 137 lb (62.143 kg)  08/08/13 133 lb (60.328 kg)  07/24/13 136 lb (61.689 kg)     Past Medical History  Diagnosis Date  . GOITER, MULTINODULAR 05/06/2010    Dr [name]  . HYPERTHYROIDISM 05/23/2010  . HYPERLIPIDEMIA 08/20/2007  . ANEMIA-NOS 08/20/2007  . ANXIETY 03/16/2007  . CARPAL TUNNEL SYNDROME, RIGHT 01/11/2008  . HYPERTENSION 03/16/2007  . VENOUS INSUFFICIENCY 09/05/2007  . DIVERTICULOSIS, COLON 08/20/2007  . CELLULITIS, LEG, RIGHT 08/20/2007  . OSTEOARTHRITIS 03/16/2007  . OSTEOPOROSIS 08/20/2007  . SCOLIOSIS 01/11/2008  . BREAST CANCER, HX OF 03/16/2007  . SKIN CANCER, HX OF  11/12/2008     R cheek 2010, L Cheek 2011 Dr. Tonia Brooms  . PREMATURE ATRIAL CONTRACTIONS 09/21/2010  . Thyroid nodule   . Cataract     floaters  . Pneumonia 07/2011    took abx for several weeks  . IBS (irritable bowel syndrome)   . Collagenous colitis     Current Outpatient Prescriptions  Medication Sig Dispense Refill  . ALPRAZolam (XANAX) 0.25 MG tablet TAKE 1 TO 2 TABLETS BY MOUTH 3 TIMES A DAY AS NEEDED FOR SLEEP  90 tablet  1  . aspirin 325 MG tablet Take 325 mg by mouth daily.      . Calcium Carbonate-Vitamin D (CALCIUM 600+D) 600-400 MG-UNIT per tablet Take 1 tablet by mouth daily.      . carvedilol (COREG) 12.5 MG tablet Take 1 tablet (12.5 mg total) by mouth 2 (two) times daily with a meal.  180 tablet  3  . Fluticasone-Salmeterol (ADVAIR DISKUS) 250-50 MCG/DOSE AEPB Inhale 1 puff into the lungs 2 (two) times daily.  1 each  3  . furosemide (LASIX) 20 MG tablet Take one  tablet every day  90 tablet  4  . losartan (COZAAR) 100 MG tablet Take 1 tablet (100 mg total) by mouth daily.  90 tablet  3  . Multiple Vitamins-Minerals (MULTIVITAMIN PO) Take 1 each by mouth daily.      . potassium chloride (K-DUR) 10 MEQ tablet Take 1 tablet (10 mEq total) by mouth daily.  30 tablet  11   No current facility-administered medications for  this visit.    Allergies:   Cefuroxime; Celecoxib; Codeine; Ranitidine; and Tramadol hcl   Social History:  The patient  reports that she quit smoking about 32 years ago. She has never used smokeless tobacco. She reports that she drinks alcohol. She reports that she does not use illicit drugs.   Family History:  The patient's family history includes Alcohol abuse in her father; Alzheimer's disease in her mother; Dementia in her mother; Diabetes in her other; Heart attack in her father; Heart disease in her mother; Hypertension in her mother; Mental retardation in her mother; Prostate cancer in her brother. There is no history of Colon cancer or Anesthesia  problems.   ROS:  Please see the history of present illness.      All other systems reviewed and negative.   PHYSICAL EXAM: VS:  BP 140/82  Pulse 68  Ht 5\' 2"  (1.575 m)  Wt 137 lb (62.143 kg)  BMI 25.05 kg/m2 Well nourished, well developed, in no acute distress HEENT: normal Neck: no JVD Cardiac:  normal S1, S2; RRR; no murmur Lungs:  clear to auscultation bilaterally, no wheezing, rhonchi or rales Abd: soft, nontender, no hepatomegaly Ext: no edema Skin: warm and dry Neuro:  CNs 2-12 intact, no focal abnormalities noted  EKG:  NSR, HR 68, LAD, poor R wave progression, IVCD, inferior Q waves, no change from prior tracing     ASSESSMENT AND PLAN:  Chronic Systolic CHF:  Her volume appears stable on exam.  She is NYHA Class 2-2b.  Continue current dose of Lasix.  Check follow up BMET and BNP.   Dyspnea:  DOE likely somewhat related to untreated COPD.  I will refill her Advair.  She will get further refills from primary care.   Non-Ischemic Cardiomyopathy:  Continue beta blocker, ARB.   Hypertension:  Controlled.  COPD:  Follow up with primary care. Disposition:  F/u with Dr. Jenkins Rouge as planned.    Signed, Richardson Dopp, PA-C  08/22/2013 11:47 AM

## 2013-08-22 NOTE — Patient Instructions (Signed)
A REFILL FOR YOUR ADVAIR WAS SENT IN ; FURTHER REFILLS TO BE DONE WITH YOUR PRIMARY CARE PHYSICIAN  LAB WORK TODAY; BMET, BNP  WE WILL SEND OUT A LETTER FOR YOU TO FOLLOW UP WITH DR. Johnsie Cancel IN June 2015

## 2013-08-25 ENCOUNTER — Telehealth: Payer: Self-pay | Admitting: Cardiovascular Disease

## 2013-08-25 NOTE — Telephone Encounter (Signed)
Called patient and reviewed lab work results and SW recommendations. See note attached to labs.

## 2013-08-25 NOTE — Telephone Encounter (Signed)
New Problem:  Pt is calling to hear test results.

## 2013-09-05 ENCOUNTER — Ambulatory Visit
Admission: RE | Admit: 2013-09-05 | Discharge: 2013-09-05 | Disposition: A | Discharge status: Home / Self Care | Payer: Medicare Other | Source: Ambulatory Visit | Attending: Endocrinology | Admitting: Endocrinology

## 2013-09-05 DIAGNOSIS — E042 Nontoxic multinodular goiter: Secondary | ICD-10-CM

## 2013-09-12 ENCOUNTER — Other Ambulatory Visit: Payer: Self-pay | Admitting: Internal Medicine

## 2013-09-12 ENCOUNTER — Ambulatory Visit (INDEPENDENT_AMBULATORY_CARE_PROVIDER_SITE_OTHER): Payer: Medicare Other | Admitting: Internal Medicine

## 2013-09-12 ENCOUNTER — Encounter: Payer: Self-pay | Admitting: Internal Medicine

## 2013-09-12 VITALS — BP 140/80 | HR 68 | Temp 97.2°F | Resp 16 | Wt 136.0 lb

## 2013-09-12 DIAGNOSIS — I10 Essential (primary) hypertension: Secondary | ICD-10-CM | POA: Diagnosis not present

## 2013-09-12 DIAGNOSIS — I509 Heart failure, unspecified: Secondary | ICD-10-CM

## 2013-09-12 DIAGNOSIS — R0609 Other forms of dyspnea: Secondary | ICD-10-CM

## 2013-09-12 DIAGNOSIS — E0590 Thyrotoxicosis, unspecified without thyrotoxic crisis or storm: Secondary | ICD-10-CM

## 2013-09-12 DIAGNOSIS — J069 Acute upper respiratory infection, unspecified: Secondary | ICD-10-CM

## 2013-09-12 DIAGNOSIS — R0989 Other specified symptoms and signs involving the circulatory and respiratory systems: Secondary | ICD-10-CM

## 2013-09-12 DIAGNOSIS — I5022 Chronic systolic (congestive) heart failure: Secondary | ICD-10-CM | POA: Diagnosis not present

## 2013-09-12 MED ORDER — AZITHROMYCIN 250 MG PO TABS: ORAL_TABLET | ORAL | Status: DC

## 2013-09-12 MED ORDER — ALPRAZOLAM 0.25 MG PO TABS: ORAL_TABLET | ORAL | Status: DC

## 2013-09-12 NOTE — Assessment & Plan Note (Signed)
Continue with current prescription therapy as reflected on the Med list.  

## 2013-09-12 NOTE — Assessment & Plan Note (Signed)
We will see if Card Rehab could help w/DOE

## 2013-09-12 NOTE — Progress Notes (Signed)
Patient ID: Mia Contreras, female   DOB: 13-Jun-1929, 80 y.o.   MRN: [id number]   Subjective:    HPI  C/o SOB w/exertion,  F/u wound on L lower leg - injured w/a branch again < 12 week ago  F/u diarhea - resolved on steroids.  F/u fatigue, not sleeping well  The patient presents for a follow-up of  Chronic CHF, hypertension, chronic dyslipidemia, knee OA - not controlled with medicines. No wt loss. BP is nl at home.    Wt Readings from Last 3 Encounters:  09/12/13 136 lb (61.689 kg)  08/22/13 137 lb (62.143 kg)  08/08/13 133 lb (60.328 kg)   BP Readings from Last 3 Encounters:  09/12/13 148/90  08/22/13 140/82  08/08/13 126/64      Past Medical History  Diagnosis Date  . GOITER, MULTINODULAR 05/06/2010    Dr [name]  . HYPERTHYROIDISM 05/23/2010  . HYPERLIPIDEMIA 08/20/2007  . ANEMIA-NOS 08/20/2007  . ANXIETY 03/16/2007  . CARPAL TUNNEL SYNDROME, RIGHT 01/11/2008  . HYPERTENSION 03/16/2007  . VENOUS INSUFFICIENCY 09/05/2007  . DIVERTICULOSIS, COLON 08/20/2007  . CELLULITIS, LEG, RIGHT 08/20/2007  . OSTEOARTHRITIS 03/16/2007  . OSTEOPOROSIS 08/20/2007  . SCOLIOSIS 01/11/2008  . BREAST CANCER, HX OF 03/16/2007  . SKIN CANCER, HX OF 11/12/2008     R cheek 2010, L Cheek 2011 Dr. Tonia Brooms  . PREMATURE ATRIAL CONTRACTIONS 09/21/2010  . Thyroid nodule   . Cataract     floaters  . Pneumonia 07/2011    took abx for several weeks  . IBS (irritable bowel syndrome)   . Collagenous colitis    Past Surgical History  Procedure Laterality Date  . Total knee arthroplasty    . Breast lumpectomy Left 2001  . Abdominal hysterectomy    . Appendectomy  1972  . Mastectomy, partial Left   . Carpal tunnel release    . Total knee arthroplasty Right 2010    Dr Para March  . Cataract extraction    . Skin cancer excision      face-Dr. Bing Plume  . Xrt      chemo, surgery  . Eye surgery      cataract ext  . Total knee arthroplasty  10/30/2011    Procedure: TOTAL KNEE ARTHROPLASTY;  Surgeon:  Lorn Junes, MD;  Location: [facility];  Service: Orthopedics;  Laterality: Left;     . Hernia repair      reports that she quit smoking about 32 years ago. She has never used smokeless tobacco. She reports that she drinks alcohol. She reports that she does not use illicit drugs. family history includes Alcohol abuse in her father; Alzheimer's disease in her mother; Dementia in her mother; Diabetes in her other; Heart attack in her father; Heart disease in her mother; Hypertension in her mother; Mental retardation in her mother; Prostate cancer in her brother. There is no history of Colon cancer or Anesthesia problems. Allergies  Allergen Reactions  . Cefuroxime Diarrhea  . Celecoxib Nausea Only  . Codeine Nausea Only  . Ranitidine     bloating  . Tramadol Hcl Nausea Only   Current Outpatient Prescriptions on File Prior to Visit  Medication Sig Dispense Refill  . ALPRAZolam (XANAX) 0.25 MG tablet TAKE 1 TO 2 TABLETS BY MOUTH 3 TIMES A DAY AS NEEDED FOR SLEEP  90 tablet  1  . aspirin 325 MG tablet Take 325 mg by mouth daily.      . Calcium Carbonate-Vitamin D (CALCIUM 600+D) 600-400 MG-UNIT per tablet  Take 1 tablet by mouth daily.      . carvedilol (COREG) 12.5 MG tablet Take 1 tablet (12.5 mg total) by mouth 2 (two) times daily with a meal.  180 tablet  3  . Fluticasone-Salmeterol (ADVAIR DISKUS) 250-50 MCG/DOSE AEPB Inhale 1 puff into the lungs 2 (two) times daily.  1 each  0  . furosemide (LASIX) 20 MG tablet Take one  tablet every day  90 tablet  4  . losartan (COZAAR) 100 MG tablet Take 1 tablet (100 mg total) by mouth daily.  90 tablet  3  . Multiple Vitamins-Minerals (MULTIVITAMIN PO) Take 1 each by mouth daily.      . potassium chloride (K-DUR) 10 MEQ tablet Take 1 tablet (10 mEq total) by mouth daily.  30 tablet  11   No current facility-administered medications on file prior to visit.   BP 148/90  Pulse 68  Temp(Src) 97.2 F (36.2 C) (Oral)  Resp 16  Wt 136 lb (61.689  kg)   Review of Systems  Constitutional: Negative for activity change, appetite change, fatigue and unexpected weight change.  HENT: Positive for congestion. Negative for mouth sores and sinus pressure.   Eyes: Negative for visual disturbance.  Respiratory: Negative for chest tightness.   Gastrointestinal: Negative for nausea.  Genitourinary: Negative for frequency, difficulty urinating and vaginal pain.  Musculoskeletal: Positive for gait problem and joint swelling (L knee hurts). Negative for back pain.  Skin: Negative for pallor.  Neurological: Negative for dizziness, tremors, weakness and numbness.  Psychiatric/Behavioral: Negative for suicidal ideas, confusion and sleep disturbance. The patient is not nervous/anxious.        Objective:   Physical Exam  Constitutional: She appears well-developed and well-nourished. No distress.  HENT:  Head: Normocephalic.  Right Ear: External ear normal.  Left Ear: External ear normal.  Nose: Nose normal.  Nasal mucosa is swollen  Eyes: Conjunctivae are normal. Pupils are equal, round, and reactive to light. Right eye exhibits no discharge. Left eye exhibits no discharge.  Neck: Normal range of motion. Neck supple. No JVD present. No tracheal deviation present. No thyromegaly present.  Cardiovascular: Normal rate, regular rhythm and normal heart sounds.   Pulmonary/Chest: No stridor. No respiratory distress. She has no wheezes.  Abdominal: Soft. Bowel sounds are normal. She exhibits no distension and no mass. There is no tenderness. There is no rebound and no guarding.  Musculoskeletal: She exhibits tenderness (L knee). She exhibits no edema.  Lymphadenopathy:    She has no cervical adenopathy.  Neurological: She displays normal reflexes. No cranial nerve deficit. She exhibits normal muscle tone. Coordination normal.  Skin: No rash noted. No erythema.  Psychiatric: She has a normal mood and affect. Her behavior is normal. Judgment and thought  content normal.  L knee w/a post-op scar L leg wound V shaped 1x3 cm - clean; dressed  Lab Results  Component Value Date   WBC 5.3 03/05/2013   HGB 12.5 03/05/2013   HCT 37.2 03/05/2013   PLT 245.0 03/05/2013   GLUCOSE 104* 08/22/2013   CHOL 172 03/23/2011   TRIG 78.0 03/23/2011   HDL 63.80 03/23/2011   LDLCALC 93 03/23/2011   ALT 20 05/30/2013   AST 28 05/30/2013   NA 142 08/22/2013   K 4.6 08/22/2013   CL 103 08/22/2013   CREATININE 1.0 08/22/2013   BUN 18 08/22/2013   CO2 33* 08/22/2013   TSH 1.59 05/30/2013   INR 0.95 10/25/2011   HGBA1C 5.6 09/08/2009  MICROALBUR 0.6 03/23/2011         Assessment & Plan:

## 2013-09-12 NOTE — Assessment & Plan Note (Signed)
Will see if Card Rehab could help w/DOE

## 2013-09-12 NOTE — Progress Notes (Signed)
Pre visit review using our clinic review tool, if applicable. No additional management support is needed unless otherwise documented below in the visit note. 

## 2013-09-12 NOTE — Assessment & Plan Note (Signed)
z pac 

## 2013-09-15 ENCOUNTER — Telehealth: Payer: Self-pay | Admitting: Internal Medicine

## 2013-09-15 NOTE — Telephone Encounter (Signed)
Relevant patient education assigned to patient using Emmi. ° °

## 2013-09-26 ENCOUNTER — Ambulatory Visit (INDEPENDENT_AMBULATORY_CARE_PROVIDER_SITE_OTHER): Payer: Medicare Other | Admitting: Physician Assistant

## 2013-09-26 ENCOUNTER — Encounter: Payer: Self-pay | Admitting: Physician Assistant

## 2013-09-26 VITALS — BP 160/80 | HR 66 | Ht 62.0 in | Wt 138.0 lb

## 2013-09-26 DIAGNOSIS — J449 Chronic obstructive pulmonary disease, unspecified: Secondary | ICD-10-CM

## 2013-09-26 DIAGNOSIS — R0602 Shortness of breath: Secondary | ICD-10-CM

## 2013-09-26 DIAGNOSIS — I428 Other cardiomyopathies: Secondary | ICD-10-CM

## 2013-09-26 DIAGNOSIS — I5022 Chronic systolic (congestive) heart failure: Secondary | ICD-10-CM

## 2013-09-26 DIAGNOSIS — I10 Essential (primary) hypertension: Secondary | ICD-10-CM

## 2013-09-26 LAB — BRAIN NATRIURETIC PEPTIDE: Pro B Natriuretic peptide (BNP): 1753 pg/mL — ABNORMAL HIGH (ref 0.0–100.0)

## 2013-09-26 NOTE — Patient Instructions (Addendum)
Your physician recommends that you schedule a follow-up appointment in: [place]  Your physician recommends that you return for lab work in: TODAY BMET, BNP  Your physician recommends that you continue on your current medications as directed. Please refer to the Current Medication list given to you today.

## 2013-09-26 NOTE — Progress Notes (Signed)
[place], [place], [place]  [address] Phone: [phone] Fax:  [phone]  Date:  09/26/2013   ID:  Mia Contreras, DOB Mar 13, 1929, MRN [id number]  PCP:  Walker Kehr, MD  Cardiologist:  Dr. Jenkins Rouge     History of Present Illness: Mia Contreras is a 80 y.o. female with a history of nonischemic cardiomyopathy, systolic CHF, PACs, breast cancer.  She was recently seen by Dr. Jenkins Rouge and noted to have increased dyspnea.  BNP was elevaetd and she was placed on Lasix.  I saw her in f/u 08/22/13.  She continued to noted DOE.  She admitted to being out of her Advair for ~ 2 mos.  I refilled this.  Her BNP remained elevated on Lasix and I asked her to follow up today.    Lexiscan Myoview (04/2011): No ischemia, EF 32%. Echocardiogram (12/2012): EF 20%, diffuse HK, diastolic dysfunction, aortic sclerosis without stenosis, mild MR, mild to moderate LAE, mildly reduced RVSF, PASP 38.    She was recently placed on a Z-Pak for sinusitis. She continues to note NYHA class 2-2b dyspnea. There has been no improvement with resuming Advair. She denies chest pain, syncope, orthopnea, PND or edema.  Recent Labs: 03/05/2013: Hemoglobin 12.5  05/30/2013: ALT 20; TSH 1.59  08/22/2013: Creatinine 1.0; Potassium 4.6; Pro B Natriuretic peptide (BNP) 1430.0*   Wt Readings from Last 3 Encounters:  09/26/13 138 lb (62.596 kg)  09/12/13 136 lb (61.689 kg)  08/22/13 137 lb (62.143 kg)     Past Medical History  Diagnosis Date  . GOITER, MULTINODULAR 05/06/2010    Dr [name]  . HYPERTHYROIDISM 05/23/2010  . HYPERLIPIDEMIA 08/20/2007  . ANEMIA-NOS 08/20/2007  . ANXIETY 03/16/2007  . CARPAL TUNNEL SYNDROME, RIGHT 01/11/2008  . HYPERTENSION 03/16/2007  . VENOUS INSUFFICIENCY 09/05/2007  . DIVERTICULOSIS, COLON 08/20/2007  . CELLULITIS, LEG, RIGHT 08/20/2007  . OSTEOARTHRITIS 03/16/2007  . OSTEOPOROSIS 08/20/2007  . SCOLIOSIS 01/11/2008  . BREAST CANCER, HX OF 03/16/2007  . SKIN CANCER,  HX OF 11/12/2008     R cheek 2010, L Cheek 2011 Dr. Tonia Brooms  . PREMATURE ATRIAL CONTRACTIONS 09/21/2010  . Thyroid nodule   . Cataract     floaters  . Pneumonia 07/2011    took abx for several weeks  . IBS (irritable bowel syndrome)   . Collagenous colitis     Current Outpatient Prescriptions  Medication Sig Dispense Refill  . ADVAIR DISKUS 250-50 MCG/DOSE AEPB INHALE 1 PUFF INTO THE LUNGS 2 (TWO) TIMES DAILY.  60 each  3  . aspirin 325 MG tablet Take 325 mg by mouth daily.      Marland Kitchen azithromycin (ZITHROMAX Z-PAK) 250 MG tablet As directed  6 each  0  . Calcium Carbonate-Vitamin D (CALCIUM 600+D) 600-400 MG-UNIT per tablet Take 1 tablet by mouth daily.      . carvedilol (COREG) 12.5 MG tablet Take 1 tablet (12.5 mg total) by mouth 2 (two) times daily with a meal.  180 tablet  3  . furosemide (LASIX) 20 MG tablet Take one  tablet every day  90 tablet  4  . losartan (COZAAR) 100 MG tablet Take 1 tablet (100 mg total) by mouth daily.  90 tablet  3  . Multiple Vitamins-Minerals (MULTIVITAMIN PO) Take 1 each by mouth daily.      . potassium chloride (K-DUR) 10 MEQ tablet Take 1 tablet (10 mEq total) by mouth daily.  30 tablet  11   No current facility-administered  medications for this visit.    Allergies:   Cefuroxime; Celecoxib; Codeine; Ranitidine; and Tramadol hcl   Social History:  The patient  reports that she quit smoking about 32 years ago. She has never used smokeless tobacco. She reports that she drinks alcohol. She reports that she does not use illicit drugs.   Family History:  The patient's family history includes Alcohol abuse in her father; Alzheimer's disease in her mother; Dementia in her mother; Diabetes in her other; Heart attack in her father; Heart disease in her mother; Hypertension in her mother; Mental retardation in her mother; Prostate cancer in her brother. There is no history of Colon cancer or Anesthesia problems.   ROS:  Please see the history of present illness.       All other systems reviewed and negative.   PHYSICAL EXAM: VS:  BP 160/80  Pulse 66  Ht 5\' 2"  (1.575 m)  Wt 138 lb (62.596 kg)  BMI 25.23 kg/m2 Well nourished, well developed, in no acute distress HEENT: normal Neck: no JVD Cardiac:  normal S1, S2; RRR; no murmur Lungs:  Decreased breath sounds bilaterally, no wheezing, rhonchi or rales Abd: soft, nontender, no hepatomegaly Ext: no edema Skin: warm and dry Neuro:  CNs 2-12 intact, no focal abnormalities noted  EKG:   NSR, HR 66, LAD, PACs and PVCs, no change from prior tracing    ASSESSMENT AND PLAN:  Chronic Systolic CHF:  She continues to have mild dyspnea. She's had a fairly significantly elevated BNP in the past. She's now on daily Lasix without much improvement. On exam, she does not appear to be volume overloaded.  Her weight is fairly stable.  Continue current dose of beta blocker and ARB. Continue current dose of Lasix. Check a follow up basic metabolic panel and BNP. If her BNP remains elevated, consider placing her on spironolactone. Dyspnea:  Probably multifactorial. Continue Advair. Continue current dose of Lasix. Check BNP as noted above.   Non-Ischemic Cardiomyopathy:  Continue current therapy. Consider stopping potassium and starting spironolactone. Await results of basic metabolic panel and BNP drawn today. Hypertension:  Blood pressure elevated today. She tells me that it typically runs higher in the doctor's office. She tells me her blood pressures at home are better. I've asked her to keep an eye on her blood pressures at home.  COPD:  F/u with PCP. Thyroid Nodules:  F/u with PCP.  Disposition:  F/u with Dr. Jenkins Rouge in 8 weeks.    Signed, Richardson Dopp, PA-C  09/26/2013 11:31 AM

## 2013-09-29 LAB — BASIC METABOLIC PANEL
BUN: 24 mg/dL — ABNORMAL HIGH (ref 6–23)
CALCIUM: 9.8 mg/dL (ref 8.4–10.5)
CHLORIDE: 102 meq/L (ref 96–112)
CO2: 29 mEq/L (ref 19–32)
Creatinine, Ser: 1 mg/dL (ref 0.4–1.2)
GFR: 55.11 mL/min — ABNORMAL LOW (ref >60.00)
Glucose, Bld: 71 mg/dL (ref 70–99)
Potassium: 4.3 mEq/L (ref 3.5–5.1)
Sodium: 139 mEq/L (ref 135–145)

## 2013-09-30 ENCOUNTER — Telehealth: Payer: Self-pay | Admitting: *Deleted

## 2013-09-30 DIAGNOSIS — I10 Essential (primary) hypertension: Secondary | ICD-10-CM

## 2013-09-30 DIAGNOSIS — I509 Heart failure, unspecified: Secondary | ICD-10-CM

## 2013-09-30 DIAGNOSIS — I428 Other cardiomyopathies: Secondary | ICD-10-CM

## 2013-09-30 MED ORDER — SPIRONOLACTONE 25 MG PO TABS: 12.5000 mg | ORAL_TABLET | Freq: Every day | ORAL | Status: DC

## 2013-09-30 NOTE — Telephone Encounter (Signed)
pt notified about lab results and med changes. She cannot do BMET 5 days or 12 days later because of work, so pushed to dates to 2/18 and 2/17 for [employer]. D/c K+, start spironolactone 12.5 mg qd, rx sent into [facility]

## 2013-09-30 NOTE — Telephone Encounter (Signed)
I tried to s/w pt about her lab results but she was running out the door and said she will need tcb in about 35 minutes for results.

## 2013-09-30 NOTE — Telephone Encounter (Signed)
lmptcb for lab results 

## 2013-09-30 NOTE — Telephone Encounter (Signed)
Follow up  ° ° ° °Returning call back to nurse  °

## 2013-09-30 NOTE — Telephone Encounter (Signed)
Follow up     Pt is returning Carol's call.  She is now at home.

## 2013-10-08 ENCOUNTER — Other Ambulatory Visit (INDEPENDENT_AMBULATORY_CARE_PROVIDER_SITE_OTHER): Payer: Medicare Other

## 2013-10-08 DIAGNOSIS — I428 Other cardiomyopathies: Secondary | ICD-10-CM | POA: Diagnosis not present

## 2013-10-08 DIAGNOSIS — I10 Essential (primary) hypertension: Secondary | ICD-10-CM | POA: Diagnosis not present

## 2013-10-08 DIAGNOSIS — I509 Heart failure, unspecified: Secondary | ICD-10-CM

## 2013-10-08 LAB — BASIC METABOLIC PANEL
BUN: 16 mg/dL (ref 6–23)
CALCIUM: 9.6 mg/dL (ref 8.4–10.5)
CO2: 29 meq/L (ref 19–32)
Chloride: 103 mEq/L (ref 96–112)
Creatinine, Ser: 1.1 mg/dL (ref 0.4–1.2)
GFR: 52.71 mL/min — ABNORMAL LOW (ref >60.00)
Glucose, Bld: 74 mg/dL (ref 70–99)
Potassium: 3.8 mEq/L (ref 3.5–5.1)
SODIUM: 139 meq/L (ref 135–145)

## 2013-10-09 ENCOUNTER — Telehealth: Payer: Self-pay | Admitting: *Deleted

## 2013-10-09 NOTE — Telephone Encounter (Signed)
pt notified about lab results today with verbal understanding. Pt states she "is in good hands here with [name]". I thanked pt for the comment for the office.Marland Kitchen

## 2013-10-17 ENCOUNTER — Other Ambulatory Visit: Payer: Self-pay | Admitting: *Deleted

## 2013-10-17 ENCOUNTER — Other Ambulatory Visit (INDEPENDENT_AMBULATORY_CARE_PROVIDER_SITE_OTHER): Payer: Medicare Other

## 2013-10-17 DIAGNOSIS — I428 Other cardiomyopathies: Secondary | ICD-10-CM | POA: Diagnosis not present

## 2013-10-17 DIAGNOSIS — I509 Heart failure, unspecified: Secondary | ICD-10-CM | POA: Diagnosis not present

## 2013-10-17 DIAGNOSIS — I10 Essential (primary) hypertension: Secondary | ICD-10-CM

## 2013-10-17 LAB — BASIC METABOLIC PANEL
BUN: 19 mg/dL (ref 6–23)
CHLORIDE: 98 meq/L (ref 96–112)
CO2: 32 mEq/L (ref 19–32)
Calcium: 9.8 mg/dL (ref 8.4–10.5)
Creatinine, Ser: 1 mg/dL (ref 0.4–1.2)
GFR: 54.48 mL/min — AB (ref >60.00)
Glucose, Bld: 90 mg/dL (ref 70–99)
POTASSIUM: 4.1 meq/L (ref 3.5–5.1)
Sodium: 137 mEq/L (ref 135–145)

## 2013-10-31 ENCOUNTER — Other Ambulatory Visit (INDEPENDENT_AMBULATORY_CARE_PROVIDER_SITE_OTHER): Payer: Medicare Other

## 2013-10-31 DIAGNOSIS — I10 Essential (primary) hypertension: Secondary | ICD-10-CM | POA: Diagnosis not present

## 2013-10-31 LAB — BASIC METABOLIC PANEL
BUN: 23 mg/dL (ref 6–23)
CO2: 31 meq/L (ref 19–32)
Calcium: 9.6 mg/dL (ref 8.4–10.5)
Chloride: 102 mEq/L (ref 96–112)
Creatinine, Ser: 1 mg/dL (ref 0.4–1.2)
GFR: 55.1 mL/min — ABNORMAL LOW (ref >60.00)
GLUCOSE: 68 mg/dL — AB (ref 70–99)
POTASSIUM: 4.3 meq/L (ref 3.5–5.1)
SODIUM: 140 meq/L (ref 135–145)

## 2013-11-06 DIAGNOSIS — M1710 Unilateral primary osteoarthritis, unspecified knee: Secondary | ICD-10-CM | POA: Diagnosis not present

## 2013-11-28 ENCOUNTER — Ambulatory Visit (INDEPENDENT_AMBULATORY_CARE_PROVIDER_SITE_OTHER): Payer: Medicare Other | Admitting: Cardiovascular Disease

## 2013-11-28 ENCOUNTER — Encounter: Payer: Self-pay | Admitting: Cardiovascular Disease

## 2013-11-28 VITALS — BP 140/80 | HR 60 | Ht 62.0 in | Wt 136.4 lb

## 2013-11-28 DIAGNOSIS — I428 Other cardiomyopathies: Secondary | ICD-10-CM | POA: Diagnosis not present

## 2013-11-28 DIAGNOSIS — I872 Venous insufficiency (chronic) (peripheral): Secondary | ICD-10-CM | POA: Diagnosis not present

## 2013-11-28 DIAGNOSIS — R001 Bradycardia, unspecified: Secondary | ICD-10-CM

## 2013-11-28 DIAGNOSIS — I498 Other specified cardiac arrhythmias: Secondary | ICD-10-CM | POA: Diagnosis not present

## 2013-11-28 NOTE — Assessment & Plan Note (Signed)
Euvolemic no dyspnea  Continue current meds  Given age not a candidate for aggressive invasive f/u

## 2013-11-28 NOTE — Assessment & Plan Note (Signed)
Just stasis changes with no edema Stable

## 2013-11-28 NOTE — Patient Instructions (Signed)
Your physician wants you to follow-up in:  6 MONTHS WITH DR [name]  You will receive a reminder letter in the mail two months in advance. If you don't receive a letter, please call our office to schedule the follow-up appointment. Your physician recommends that you continue on your current medications as directed. Please refer to the Current Medication list given to you today. 

## 2013-11-28 NOTE — Assessment & Plan Note (Signed)
Benign no palpitations stable

## 2013-11-28 NOTE — Progress Notes (Signed)
Patient ID: BENIGNA CENTRELLA, female   DOB: 03/02/1929, 80 y.o.   MRN: [id number] GLADIE FOGAL is a 80 y.o. female with a history of nonischemic cardiomyopathy, systolic CHF, PACs, breast cancer. She was recently seen by Dr. Jenkins Rouge and noted to have increased dyspnea. BNP was elevaetd and she was placed on Lasix. I saw her in f/u 08/22/13. She continued to noted DOE. She admitted to being out of her Advair for ~ 2 mos. I refilled this. Her BNP remained elevated on Lasix and I asked her to follow up today.  Lexiscan Myoview (04/2011): No ischemia, EF 32%.  Echocardiogram (12/2012): EF 20%, diffuse HK, diastolic dysfunction, aortic sclerosis without stenosis, mild MR, mild to moderate LAE, mildly reduced RVSF, PASP 38.  She was recently placed on a Z-Pak for sinusitis. She continues to note NYHA class 2-2b dyspnea. There has been no improvement with resuming Advair. She denies chest pain, syncope, orthopnea, PND or edema.      ROS: Denies fever, malais, weight loss, blurry vision, decreased visual acuity, cough, sputum, SOB, hemoptysis, pleuritic pain, palpitaitons, heartburn, abdominal pain, melena, lower extremity edema, claudication, or rash.  All other systems reviewed and negative  General: Affect appropriate Healthy:  appears stated age 74: normal Neck supple with no adenopathy JVP normal no bruits no thyromegaly Lungs clear with no wheezing and good diaphragmatic motion Heart:  S1/S2 no murmur, no rub, gallop or click PMI normal Abdomen: benighn, BS positve, no tenderness, no AAA no bruit.  No HSM or HJR Distal pulses intact with no bruits No edema Neuro non-focal Skin warm and dry No muscular weakness   Current Outpatient Prescriptions  Medication Sig Dispense Refill  . ADVAIR DISKUS 250-50 MCG/DOSE AEPB INHALE 1 PUFF INTO THE LUNGS 2 (TWO) TIMES DAILY.  60 each  3  . aspirin 325 MG tablet Take 325 mg by mouth daily.      . Calcium Carbonate-Vitamin D (CALCIUM 600+D)  600-400 MG-UNIT per tablet Take 1 tablet by mouth daily.      . carvedilol (COREG) 12.5 MG tablet Take 1 tablet (12.5 mg total) by mouth 2 (two) times daily with a meal.  180 tablet  3  . furosemide (LASIX) 20 MG tablet Take one  tablet every day  90 tablet  4  . losartan (COZAAR) 100 MG tablet Take 1 tablet (100 mg total) by mouth daily.  90 tablet  3  . Multiple Vitamins-Minerals (MULTIVITAMIN PO) Take 1 each by mouth daily.      Marland Kitchen spironolactone (ALDACTONE) 25 MG tablet Take 0.5 tablets (12.5 mg total) by mouth daily.  30 tablet  11  . ALPRAZolam (XANAX) 0.25 MG tablet Take 0.25 mg by mouth 2 (two) times daily.       No current facility-administered medications for this visit.    Allergies  Cefuroxime; Celecoxib; Codeine; Ranitidine; and Tramadol hcl  Electrocardiogram:2/6  SR PAC nonspecific ST T wave changes  Today SR rat [id number]  PAC PR 238 LAD [place]   Assessment and Plan

## 2013-11-28 NOTE — Assessment & Plan Note (Signed)
Well controlled.  Continue current medications and low sodium Dash type diet.    

## 2013-12-12 ENCOUNTER — Ambulatory Visit (INDEPENDENT_AMBULATORY_CARE_PROVIDER_SITE_OTHER): Payer: Medicare Other | Admitting: Internal Medicine

## 2013-12-12 ENCOUNTER — Encounter: Payer: Self-pay | Admitting: Internal Medicine

## 2013-12-12 VITALS — BP 148/80 | HR 72 | Temp 96.2°F | Resp 16 | Wt 136.0 lb

## 2013-12-12 DIAGNOSIS — I509 Heart failure, unspecified: Secondary | ICD-10-CM | POA: Diagnosis not present

## 2013-12-12 DIAGNOSIS — R062 Wheezing: Secondary | ICD-10-CM

## 2013-12-12 DIAGNOSIS — H6120 Impacted cerumen, unspecified ear: Secondary | ICD-10-CM | POA: Diagnosis not present

## 2013-12-12 DIAGNOSIS — F411 Generalized anxiety disorder: Secondary | ICD-10-CM

## 2013-12-12 DIAGNOSIS — I10 Essential (primary) hypertension: Secondary | ICD-10-CM

## 2013-12-12 DIAGNOSIS — E162 Hypoglycemia, unspecified: Secondary | ICD-10-CM | POA: Diagnosis not present

## 2013-12-12 LAB — GLUCOSE, POCT (MANUAL RESULT ENTRY): POC Glucose: 102 mg/dl — AB (ref 70–99)

## 2013-12-12 NOTE — Progress Notes (Signed)
Subjective:    HPI  C/o SOB w/exertion - better  F/u wound on L lower leg - injured w/a branch again < 12 week ago  F/u diarhea - resolved on steroids.  F/u fatigue, not sleeping well  The patient presents for a follow-up of  Chronic CHF, hypertension, chronic dyslipidemia, knee OA - not controlled with medicines. No wt loss. BP is nl at home.    Wt Readings from Last 3 Encounters:  12/12/13 136 lb (61.689 kg)  11/28/13 136 lb 6.4 oz (61.871 kg)  09/26/13 138 lb (62.596 kg)   BP Readings from Last 3 Encounters:  12/12/13 148/80  11/28/13 140/80  09/26/13 160/80      Past Medical History  Diagnosis Date  . GOITER, MULTINODULAR 05/06/2010    Dr [name]  . HYPERTHYROIDISM 05/23/2010  . HYPERLIPIDEMIA 08/20/2007  . ANEMIA-NOS 08/20/2007  . ANXIETY 03/16/2007  . CARPAL TUNNEL SYNDROME, RIGHT 01/11/2008  . HYPERTENSION 03/16/2007  . VENOUS INSUFFICIENCY 09/05/2007  . DIVERTICULOSIS, COLON 08/20/2007  . CELLULITIS, LEG, RIGHT 08/20/2007  . OSTEOARTHRITIS 03/16/2007  . OSTEOPOROSIS 08/20/2007  . SCOLIOSIS 01/11/2008  . BREAST CANCER, HX OF 03/16/2007  . SKIN CANCER, HX OF 11/12/2008     R cheek 2010, L Cheek 2011 Dr. Tonia Brooms  . PREMATURE ATRIAL CONTRACTIONS 09/21/2010  . Thyroid nodule   . Cataract     floaters  . Pneumonia 07/2011    took abx for several weeks  . IBS (irritable bowel syndrome)   . Collagenous colitis    Past Surgical History  Procedure Laterality Date  . Total knee arthroplasty    . Breast lumpectomy Left 2001  . Abdominal hysterectomy    . Appendectomy  1972  . Mastectomy, partial Left   . Carpal tunnel release    . Total knee arthroplasty Right 2010    Dr Para March  . Cataract extraction    . Skin cancer excision      face-Dr. Bing Plume  . Xrt      chemo, surgery  . Eye surgery      cataract ext  . Total knee arthroplasty  10/30/2011    Procedure: TOTAL KNEE ARTHROPLASTY;  Surgeon: Lorn Junes, MD;  Location: [facility];  Service: Orthopedics;   Laterality: Left;     . Hernia repair      reports that she quit smoking about 33 years ago. She has never used smokeless tobacco. She reports that she drinks alcohol. She reports that she does not use illicit drugs. family history includes Alcohol abuse in her father; Alzheimer's disease in her mother; Dementia in her mother; Diabetes in her other; Heart attack in her father; Heart disease in her mother; Hypertension in her mother; Mental retardation in her mother; Prostate cancer in her brother. There is no history of Colon cancer or Anesthesia problems. Allergies  Allergen Reactions  . Cefuroxime Diarrhea  . Celecoxib Nausea Only  . Codeine Nausea Only  . Ranitidine     bloating  . Tramadol Hcl Nausea Only   Current Outpatient Prescriptions on File Prior to Visit  Medication Sig Dispense Refill  . ADVAIR DISKUS 250-50 MCG/DOSE AEPB INHALE 1 PUFF INTO THE LUNGS 2 (TWO) TIMES DAILY.  60 each  3  . ALPRAZolam (XANAX) 0.25 MG tablet Take 0.25 mg by mouth 2 (two) times daily.      Marland Kitchen aspirin 325 MG tablet Take 325 mg by mouth daily.      . Calcium Carbonate-Vitamin D (CALCIUM 600+D) 600-400 MG-UNIT  per tablet Take 1 tablet by mouth daily.      . carvedilol (COREG) 12.5 MG tablet Take 1 tablet (12.5 mg total) by mouth 2 (two) times daily with a meal.  180 tablet  3  . furosemide (LASIX) 20 MG tablet Take one  tablet every day  90 tablet  4  . losartan (COZAAR) 100 MG tablet Take 1 tablet (100 mg total) by mouth daily.  90 tablet  3  . Multiple Vitamins-Minerals (MULTIVITAMIN PO) Take 1 each by mouth daily.      Marland Kitchen spironolactone (ALDACTONE) 25 MG tablet Take 0.5 tablets (12.5 mg total) by mouth daily.  30 tablet  11   No current facility-administered medications on file prior to visit.   BP 148/80  Pulse 72  Temp(Src) 96.2 F (35.7 C) (Oral)  Resp 16  Wt 136 lb (61.689 kg)   Review of Systems  Constitutional: Negative for activity change, appetite change, fatigue and unexpected  weight change.  HENT: Positive for congestion. Negative for mouth sores and sinus pressure.   Eyes: Negative for visual disturbance.  Respiratory: Negative for chest tightness.   Gastrointestinal: Negative for nausea.  Genitourinary: Negative for frequency, difficulty urinating and vaginal pain.  Musculoskeletal: Positive for gait problem and joint swelling (L knee hurts). Negative for back pain.  Skin: Negative for pallor.  Neurological: Negative for dizziness, tremors, weakness and numbness.  Psychiatric/Behavioral: Negative for suicidal ideas, confusion and sleep disturbance. The patient is not nervous/anxious.        Objective:   Physical Exam  Constitutional: She appears well-developed and well-nourished. No distress.  HENT:  Head: Normocephalic.  Right Ear: External ear normal.  Left Ear: External ear normal.  Nose: Nose normal.  Nasal mucosa is swollen  Eyes: Conjunctivae are normal. Pupils are equal, round, and reactive to light. Right eye exhibits no discharge. Left eye exhibits no discharge.  Neck: Normal range of motion. Neck supple. No JVD present. No tracheal deviation present. No thyromegaly present.  Cardiovascular: Normal rate, regular rhythm and normal heart sounds.   Pulmonary/Chest: No stridor. No respiratory distress. She has no wheezes.  Abdominal: Soft. Bowel sounds are normal. She exhibits no distension and no mass. There is no tenderness. There is no rebound and no guarding.  Musculoskeletal: She exhibits tenderness (L knee). She exhibits no edema.  Lymphadenopathy:    She has no cervical adenopathy.  Neurological: She displays normal reflexes. No cranial nerve deficit. She exhibits normal muscle tone. Coordination normal.  Skin: No rash noted. No erythema.  Psychiatric: She has a normal mood and affect. Her behavior is normal. Judgment and thought content normal.  L knee w/a post-op scar B wax  Lab Results  Component Value Date   WBC 5.3 03/05/2013   HGB  12.5 03/05/2013   HCT 37.2 03/05/2013   PLT 245.0 03/05/2013   GLUCOSE 68* 10/31/2013   CHOL 172 03/23/2011   TRIG 78.0 03/23/2011   HDL 63.80 03/23/2011   LDLCALC 93 03/23/2011   ALT 20 05/30/2013   AST 28 05/30/2013   NA 140 10/31/2013   K 4.3 10/31/2013   CL 102 10/31/2013   CREATININE 1.0 10/31/2013   BUN 23 10/31/2013   CO2 31 10/31/2013   TSH 1.59 05/30/2013   INR 0.95 10/25/2011   HGBA1C 5.6 09/08/2009   MICROALBUR 0.6 03/23/2011      Procedure Note :     Procedure :  Ear irrigation   Indication:  Cerumen impaction B   Risks,  including pain, dizziness, eardrum perforation, bleeding, infection and others as well as benefits were explained to the patient in detail. Verbal consent was obtained and the patient agreed to proceed.    We used "The Elephant Ear Irrigation Device" filled with lukewarm water for irrigation. A large amount wax was recovered. Procedure has also required manual wax removal with an ear loop.   Tolerated well. Complications: None.   Postprocedure instructions :  Call if problems.      Assessment & Plan:

## 2013-12-12 NOTE — Assessment & Plan Note (Signed)
Resolved

## 2013-12-12 NOTE — Assessment & Plan Note (Signed)
Continue with current prescription therapy as reflected on the Med list.  

## 2013-12-12 NOTE — Progress Notes (Signed)
Pre visit review using our clinic review tool, if applicable. No additional management support is needed unless otherwise documented below in the visit note. 

## 2013-12-12 NOTE — Assessment & Plan Note (Signed)
See procedure 

## 2013-12-12 NOTE — Assessment & Plan Note (Signed)
CBG 

## 2013-12-12 NOTE — Assessment & Plan Note (Signed)
Continue with current prescription therapy as reflected on the Med list. Doing well 

## 2013-12-12 NOTE — Addendum Note (Signed)
Addended by: Cresenciano Lick on: 12/12/2013 03:00 PM   Modules accepted: Orders

## 2013-12-18 DIAGNOSIS — H26499 Other secondary cataract, unspecified eye: Secondary | ICD-10-CM | POA: Diagnosis not present

## 2013-12-25 ENCOUNTER — Other Ambulatory Visit: Payer: Self-pay | Admitting: Dermatology

## 2013-12-25 DIAGNOSIS — Z85828 Personal history of other malignant neoplasm of skin: Secondary | ICD-10-CM | POA: Diagnosis not present

## 2013-12-25 DIAGNOSIS — D0430 Carcinoma in situ of skin of unspecified part of face: Secondary | ICD-10-CM | POA: Diagnosis not present

## 2013-12-25 DIAGNOSIS — D485 Neoplasm of uncertain behavior of skin: Secondary | ICD-10-CM | POA: Diagnosis not present

## 2013-12-25 DIAGNOSIS — D0439 Carcinoma in situ of skin of other parts of face: Secondary | ICD-10-CM | POA: Diagnosis not present

## 2013-12-25 DIAGNOSIS — L570 Actinic keratosis: Secondary | ICD-10-CM | POA: Diagnosis not present

## 2013-12-25 DIAGNOSIS — L821 Other seborrheic keratosis: Secondary | ICD-10-CM | POA: Diagnosis not present

## 2013-12-25 DIAGNOSIS — L820 Inflamed seborrheic keratosis: Secondary | ICD-10-CM | POA: Diagnosis not present

## 2014-01-02 ENCOUNTER — Ambulatory Visit (INDEPENDENT_AMBULATORY_CARE_PROVIDER_SITE_OTHER): Payer: Medicare Other | Admitting: Internal Medicine

## 2014-01-02 ENCOUNTER — Encounter: Payer: Self-pay | Admitting: Internal Medicine

## 2014-01-02 VITALS — BP 140/74 | HR 72 | Temp 97.7°F | Resp 16 | Wt 136.0 lb

## 2014-01-02 DIAGNOSIS — I10 Essential (primary) hypertension: Secondary | ICD-10-CM | POA: Diagnosis not present

## 2014-01-02 DIAGNOSIS — R197 Diarrhea, unspecified: Secondary | ICD-10-CM | POA: Diagnosis not present

## 2014-01-02 MED ORDER — DIPHENOXYLATE-ATROPINE 2.5-0.025 MG PO TABS: 1.0000 | ORAL_TABLET | Freq: Four times a day (QID) | ORAL | Status: DC | PRN

## 2014-01-02 MED ORDER — PREDNISONE 10 MG PO TABS: ORAL_TABLET | ORAL | Status: DC

## 2014-01-02 MED ORDER — BUDESONIDE 9 MG PO TB24: ORAL_TABLET | ORAL | Status: DC

## 2014-01-02 NOTE — Assessment & Plan Note (Signed)
Continue with current prescription therapy as reflected on the Med list.  

## 2014-01-02 NOTE — Progress Notes (Signed)
Subjective:    HPI  C/o diarrhea came back 2 wks ago 2/d - loose. Prednisone helped in the past - last year.  SOB w/exertion - better  F/u wound on L lower leg - injured w/a branch again < 12 week ago  F/u fatigue, not sleeping well  The patient presents for a follow-up of  Chronic CHF, hypertension, chronic dyslipidemia, knee OA - not controlled with medicines. No wt loss. BP is nl at home.    Wt Readings from Last 3 Encounters:  01/02/14 136 lb (61.689 kg)  12/12/13 136 lb (61.689 kg)  11/28/13 136 lb 6.4 oz (61.871 kg)   BP Readings from Last 3 Encounters:  01/02/14 140/74  12/12/13 148/80  11/28/13 140/80      Past Medical History  Diagnosis Date  . GOITER, MULTINODULAR 05/06/2010    Dr [name]  . HYPERTHYROIDISM 05/23/2010  . HYPERLIPIDEMIA 08/20/2007  . ANEMIA-NOS 08/20/2007  . ANXIETY 03/16/2007  . CARPAL TUNNEL SYNDROME, RIGHT 01/11/2008  . HYPERTENSION 03/16/2007  . VENOUS INSUFFICIENCY 09/05/2007  . DIVERTICULOSIS, COLON 08/20/2007  . CELLULITIS, LEG, RIGHT 08/20/2007  . OSTEOARTHRITIS 03/16/2007  . OSTEOPOROSIS 08/20/2007  . SCOLIOSIS 01/11/2008  . BREAST CANCER, HX OF 03/16/2007  . SKIN CANCER, HX OF 11/12/2008     R cheek 2010, L Cheek 2011 Dr. Tonia Brooms  . PREMATURE ATRIAL CONTRACTIONS 09/21/2010  . Thyroid nodule   . Cataract     floaters  . Pneumonia 07/2011    took abx for several weeks  . IBS (irritable bowel syndrome)   . Collagenous colitis    Past Surgical History  Procedure Laterality Date  . Total knee arthroplasty    . Breast lumpectomy Left 2001  . Abdominal hysterectomy    . Appendectomy  1972  . Mastectomy, partial Left   . Carpal tunnel release    . Total knee arthroplasty Right 2010    Dr Para March  . Cataract extraction    . Skin cancer excision      face-Dr. Bing Plume  . Xrt      chemo, surgery  . Eye surgery      cataract ext  . Total knee arthroplasty  10/30/2011    Procedure: TOTAL KNEE ARTHROPLASTY;  Surgeon: Lorn Junes,  MD;  Location: [facility];  Service: Orthopedics;  Laterality: Left;     . Hernia repair      reports that she quit smoking about 33 years ago. She has never used smokeless tobacco. She reports that she drinks alcohol. She reports that she does not use illicit drugs. family history includes Alcohol abuse in her father; Alzheimer's disease in her mother; Dementia in her mother; Diabetes in her other; Heart attack in her father; Heart disease in her mother; Hypertension in her mother; Mental retardation in her mother; Prostate cancer in her brother. There is no history of Colon cancer or Anesthesia problems. Allergies  Allergen Reactions  . Cefuroxime Diarrhea  . Celecoxib Nausea Only  . Codeine Nausea Only  . Ranitidine     bloating  . Tramadol Hcl Nausea Only   Current Outpatient Prescriptions on File Prior to Visit  Medication Sig Dispense Refill  . ADVAIR DISKUS 250-50 MCG/DOSE AEPB INHALE 1 PUFF INTO THE LUNGS 2 (TWO) TIMES DAILY.  60 each  3  . ALPRAZolam (XANAX) 0.25 MG tablet Take 0.25 mg by mouth 2 (two) times daily.      Marland Kitchen aspirin 325 MG tablet Take 325 mg by mouth daily.      Marland Kitchen  Calcium Carbonate-Vitamin D (CALCIUM 600+D) 600-400 MG-UNIT per tablet Take 1 tablet by mouth daily.      . carvedilol (COREG) 12.5 MG tablet Take 1 tablet (12.5 mg total) by mouth 2 (two) times daily with a meal.  180 tablet  3  . furosemide (LASIX) 20 MG tablet Take one  tablet every day  90 tablet  4  . losartan (COZAAR) 100 MG tablet Take 1 tablet (100 mg total) by mouth daily.  90 tablet  3  . Multiple Vitamins-Minerals (MULTIVITAMIN PO) Take 1 each by mouth daily.      Marland Kitchen spironolactone (ALDACTONE) 25 MG tablet Take 0.5 tablets (12.5 mg total) by mouth daily.  30 tablet  11   No current facility-administered medications on file prior to visit.   BP 140/74  Pulse 72  Temp(Src) 97.7 F (36.5 C) (Oral)  Resp 16  Wt 136 lb (61.689 kg)   Review of Systems  Constitutional: Negative for activity change,  appetite change, fatigue and unexpected weight change.  HENT: Positive for congestion. Negative for mouth sores and sinus pressure.   Eyes: Negative for visual disturbance.  Respiratory: Negative for chest tightness.   Gastrointestinal: Negative for nausea.  Genitourinary: Negative for frequency, difficulty urinating and vaginal pain.  Musculoskeletal: Positive for gait problem and joint swelling (L knee hurts). Negative for back pain.  Skin: Negative for pallor.  Neurological: Negative for dizziness, tremors, weakness and numbness.  Psychiatric/Behavioral: Negative for suicidal ideas, confusion and sleep disturbance. The patient is not nervous/anxious.        Objective:   Physical Exam  Constitutional: She appears well-developed and well-nourished. No distress.  HENT:  Head: Normocephalic.  Right Ear: External ear normal.  Left Ear: External ear normal.  Nose: Nose normal.  Nasal mucosa is swollen  Eyes: Conjunctivae are normal. Pupils are equal, round, and reactive to light. Right eye exhibits no discharge. Left eye exhibits no discharge.  Neck: Normal range of motion. Neck supple. No JVD present. No tracheal deviation present. No thyromegaly present.  Cardiovascular: Normal rate, regular rhythm and normal heart sounds.   Pulmonary/Chest: No stridor. No respiratory distress. She has no wheezes.  Abdominal: Soft. Bowel sounds are normal. She exhibits no distension and no mass. There is no tenderness. There is no rebound and no guarding.  Musculoskeletal: She exhibits tenderness (L knee). She exhibits no edema.  Lymphadenopathy:    She has no cervical adenopathy.  Neurological: She displays normal reflexes. No cranial nerve deficit. She exhibits normal muscle tone. Coordination normal.  Skin: No rash noted. No erythema.  Psychiatric: She has a normal mood and affect. Her behavior is normal. Judgment and thought content normal.  L knee w/a post-op scar B wax  Lab Results   Component Value Date   WBC 5.3 03/05/2013   HGB 12.5 03/05/2013   HCT 37.2 03/05/2013   PLT 245.0 03/05/2013   GLUCOSE 68* 10/31/2013   CHOL 172 03/23/2011   TRIG 78.0 03/23/2011   HDL 63.80 03/23/2011   LDLCALC 93 03/23/2011   ALT 20 05/30/2013   AST 28 05/30/2013   NA 140 10/31/2013   K 4.3 10/31/2013   CL 102 10/31/2013   CREATININE 1.0 10/31/2013   BUN 23 10/31/2013   CO2 31 10/31/2013   TSH 1.59 05/30/2013   INR 0.95 10/25/2011   HGBA1C 5.6 09/08/2009   MICROALBUR 0.6 03/23/2011      Procedure Note :     Procedure :  Ear irrigation  Assessment & Plan:

## 2014-01-02 NOTE — Assessment & Plan Note (Addendum)
Uceris x 1 mo or Predn taper If Uceris is too expensive - get/take Prednisone instead

## 2014-01-02 NOTE — Patient Instructions (Signed)
If Uceris is too expensive - get/take Prednisone instead

## 2014-01-02 NOTE — Progress Notes (Signed)
Pre visit review using our clinic review tool, if applicable. No additional management support is needed unless otherwise documented below in the visit note. 

## 2014-02-06 ENCOUNTER — Other Ambulatory Visit (INDEPENDENT_AMBULATORY_CARE_PROVIDER_SITE_OTHER): Payer: Medicare Other

## 2014-02-06 ENCOUNTER — Encounter: Payer: Self-pay | Admitting: Internal Medicine

## 2014-02-06 ENCOUNTER — Ambulatory Visit (INDEPENDENT_AMBULATORY_CARE_PROVIDER_SITE_OTHER): Payer: Medicare Other | Admitting: Internal Medicine

## 2014-02-06 VITALS — BP 132/70 | HR 80 | Temp 98.3°F | Resp 16 | Wt 136.0 lb

## 2014-02-06 DIAGNOSIS — D649 Anemia, unspecified: Secondary | ICD-10-CM

## 2014-02-06 DIAGNOSIS — I10 Essential (primary) hypertension: Secondary | ICD-10-CM

## 2014-02-06 DIAGNOSIS — F411 Generalized anxiety disorder: Secondary | ICD-10-CM

## 2014-02-06 DIAGNOSIS — E162 Hypoglycemia, unspecified: Secondary | ICD-10-CM | POA: Diagnosis not present

## 2014-02-06 DIAGNOSIS — R197 Diarrhea, unspecified: Secondary | ICD-10-CM

## 2014-02-06 DIAGNOSIS — I509 Heart failure, unspecified: Secondary | ICD-10-CM

## 2014-02-06 DIAGNOSIS — I5022 Chronic systolic (congestive) heart failure: Secondary | ICD-10-CM | POA: Diagnosis not present

## 2014-02-06 DIAGNOSIS — E0590 Thyrotoxicosis, unspecified without thyrotoxic crisis or storm: Secondary | ICD-10-CM

## 2014-02-06 LAB — HEPATIC FUNCTION PANEL
ALK PHOS: 67 U/L (ref 39–117)
ALT: 21 U/L (ref 0–35)
AST: 31 U/L (ref 0–37)
Albumin: 4.1 g/dL (ref 3.5–5.2)
BILIRUBIN DIRECT: 0.1 mg/dL (ref 0.0–0.3)
BILIRUBIN TOTAL: 0.6 mg/dL (ref 0.2–1.2)
TOTAL PROTEIN: 6.4 g/dL (ref 6.0–8.3)

## 2014-02-06 LAB — HEMOGLOBIN A1C: Hgb A1c MFr Bld: 5.8 % (ref 4.6–6.5)

## 2014-02-06 LAB — BASIC METABOLIC PANEL
BUN: 26 mg/dL — AB (ref 6–23)
CHLORIDE: 101 meq/L (ref 96–112)
CO2: 31 mEq/L (ref 19–32)
Calcium: 9.3 mg/dL (ref 8.4–10.5)
Creatinine, Ser: 1.2 mg/dL (ref 0.4–1.2)
GFR: 47 mL/min — AB (ref >60.00)
Glucose, Bld: 120 mg/dL — ABNORMAL HIGH (ref 70–99)
POTASSIUM: 4.7 meq/L (ref 3.5–5.1)
SODIUM: 138 meq/L (ref 135–145)

## 2014-02-06 LAB — TSH: TSH: 0.61 u[IU]/mL (ref 0.35–4.50)

## 2014-02-06 MED ORDER — DIPHENOXYLATE-ATROPINE 2.5-0.025 MG PO TABS: 1.0000 | ORAL_TABLET | Freq: Four times a day (QID) | ORAL | Status: DC | PRN

## 2014-02-06 NOTE — Assessment & Plan Note (Signed)
Off iron Labs 

## 2014-02-06 NOTE — Assessment & Plan Note (Signed)
Continue with current prescription therapy as reflected on the Med list.  

## 2014-02-06 NOTE — Assessment & Plan Note (Signed)
Labs

## 2014-02-06 NOTE — Progress Notes (Signed)
Pre visit review using our clinic review tool, if applicable. No additional management support is needed unless otherwise documented below in the visit note. 

## 2014-02-06 NOTE — Assessment & Plan Note (Signed)
Doing fairly well Continue with current prescription therapy as reflected on the Med list.

## 2014-03-13 DIAGNOSIS — Z853 Personal history of malignant neoplasm of breast: Secondary | ICD-10-CM | POA: Diagnosis not present

## 2014-03-13 DIAGNOSIS — Z1231 Encounter for screening mammogram for malignant neoplasm of breast: Secondary | ICD-10-CM | POA: Diagnosis not present

## 2014-04-07 ENCOUNTER — Encounter: Payer: Self-pay | Admitting: Internal Medicine

## 2014-04-10 ENCOUNTER — Ambulatory Visit: Payer: Medicare Other | Admitting: Internal Medicine

## 2014-04-29 ENCOUNTER — Other Ambulatory Visit: Payer: Self-pay | Admitting: Dermatology

## 2014-04-29 DIAGNOSIS — L820 Inflamed seborrheic keratosis: Secondary | ICD-10-CM | POA: Diagnosis not present

## 2014-04-29 DIAGNOSIS — D0430 Carcinoma in situ of skin of unspecified part of face: Secondary | ICD-10-CM | POA: Diagnosis not present

## 2014-04-29 DIAGNOSIS — D485 Neoplasm of uncertain behavior of skin: Secondary | ICD-10-CM | POA: Diagnosis not present

## 2014-04-29 DIAGNOSIS — D0460 Carcinoma in situ of skin of unspecified upper limb, including shoulder: Secondary | ICD-10-CM | POA: Diagnosis not present

## 2014-04-29 DIAGNOSIS — D0439 Carcinoma in situ of skin of other parts of face: Secondary | ICD-10-CM | POA: Diagnosis not present

## 2014-04-29 DIAGNOSIS — L570 Actinic keratosis: Secondary | ICD-10-CM | POA: Diagnosis not present

## 2014-05-01 ENCOUNTER — Other Ambulatory Visit: Payer: Self-pay | Admitting: Internal Medicine

## 2014-05-08 ENCOUNTER — Ambulatory Visit (INDEPENDENT_AMBULATORY_CARE_PROVIDER_SITE_OTHER): Payer: Medicare Other | Admitting: Internal Medicine

## 2014-05-08 ENCOUNTER — Encounter: Payer: Self-pay | Admitting: Internal Medicine

## 2014-05-08 VITALS — BP 140/80 | HR 76 | Temp 97.6°F | Resp 16 | Wt 135.0 lb

## 2014-05-08 DIAGNOSIS — I10 Essential (primary) hypertension: Secondary | ICD-10-CM | POA: Diagnosis not present

## 2014-05-08 DIAGNOSIS — Z23 Encounter for immunization: Secondary | ICD-10-CM

## 2014-05-08 DIAGNOSIS — R609 Edema, unspecified: Secondary | ICD-10-CM | POA: Diagnosis not present

## 2014-05-08 DIAGNOSIS — I509 Heart failure, unspecified: Secondary | ICD-10-CM | POA: Diagnosis not present

## 2014-05-08 MED ORDER — LOSARTAN POTASSIUM 100 MG PO TABS: 100.0000 mg | ORAL_TABLET | Freq: Every day | ORAL | Status: DC

## 2014-05-08 MED ORDER — ALPRAZOLAM 0.25 MG PO TABS: 0.2500 mg | ORAL_TABLET | Freq: Two times a day (BID) | ORAL | Status: DC

## 2014-05-08 MED ORDER — FUROSEMIDE 20 MG PO TABS: ORAL_TABLET | ORAL | Status: DC

## 2014-05-08 NOTE — Assessment & Plan Note (Signed)
Continue with current prescription therapy as reflected on the Med list.  

## 2014-05-08 NOTE — Progress Notes (Signed)
Pre visit review using our clinic review tool, if applicable. No additional management support is needed unless otherwise documented below in the visit note. 

## 2014-05-08 NOTE — Assessment & Plan Note (Signed)
Resolved

## 2014-05-08 NOTE — Progress Notes (Signed)
Subjective:    HPI  F/u diarrhea came back 2 wks ago 2/d - loose. Prednisone helped in the past - last year. Last FlexSig in 2014 Dr Sharlett Iles  SOB w/exertion - better  F/u wound on L lower leg - injured w/a branch again < 12 week ago  F/u fatigue, not sleeping well  The patient presents for a follow-up of  Chronic CHF, hypertension, chronic dyslipidemia, knee OA - not controlled with medicines. No wt loss. BP is nl at home.    Wt Readings from Last 3 Encounters:  05/08/14 135 lb (61.236 kg)  02/06/14 136 lb (61.689 kg)  01/02/14 136 lb (61.689 kg)   BP Readings from Last 3 Encounters:  05/08/14 140/80  02/06/14 132/70  01/02/14 140/74      Past Medical History  Diagnosis Date  . GOITER, MULTINODULAR 05/06/2010    Dr [name]  . HYPERTHYROIDISM 05/23/2010  . HYPERLIPIDEMIA 08/20/2007  . ANEMIA-NOS 08/20/2007  . ANXIETY 03/16/2007  . CARPAL TUNNEL SYNDROME, RIGHT 01/11/2008  . HYPERTENSION 03/16/2007  . VENOUS INSUFFICIENCY 09/05/2007  . DIVERTICULOSIS, COLON 08/20/2007  . CELLULITIS, LEG, RIGHT 08/20/2007  . OSTEOARTHRITIS 03/16/2007  . OSTEOPOROSIS 08/20/2007  . SCOLIOSIS 01/11/2008  . BREAST CANCER, HX OF 03/16/2007  . SKIN CANCER, HX OF 11/12/2008     R cheek 2010, L Cheek 2011 Dr. Tonia Brooms  . PREMATURE ATRIAL CONTRACTIONS 09/21/2010  . Thyroid nodule   . Cataract     floaters  . Pneumonia 07/2011    took abx for several weeks  . IBS (irritable bowel syndrome)   . Collagenous colitis    Past Surgical History  Procedure Laterality Date  . Total knee arthroplasty    . Breast lumpectomy Left 2001  . Abdominal hysterectomy    . Appendectomy  1972  . Mastectomy, partial Left   . Carpal tunnel release    . Total knee arthroplasty Right 2010    Dr Para March  . Cataract extraction    . Skin cancer excision      face-Dr. Bing Plume  . Xrt      chemo, surgery  . Eye surgery      cataract ext  . Total knee arthroplasty  10/30/2011    Procedure: TOTAL KNEE ARTHROPLASTY;   Surgeon: Lorn Junes, MD;  Location: [facility];  Service: Orthopedics;  Laterality: Left;     . Hernia repair      reports that she quit smoking about 33 years ago. She has never used smokeless tobacco. She reports that she drinks alcohol. She reports that she does not use illicit drugs. family history includes Alcohol abuse in her father; Alzheimer's disease in her mother; Dementia in her mother; Diabetes in her other; Heart attack in her father; Heart disease in her mother; Hypertension in her mother; Mental retardation in her mother; Prostate cancer in her brother. There is no history of Colon cancer or Anesthesia problems. Allergies  Allergen Reactions  . Cefuroxime Diarrhea  . Celecoxib Nausea Only  . Codeine Nausea Only  . Ranitidine     bloating  . Tramadol Hcl Nausea Only   Current Outpatient Prescriptions on File Prior to Visit  Medication Sig Dispense Refill  . ADVAIR DISKUS 250-50 MCG/DOSE AEPB INHALE 1 PUFF INTO THE LUNGS 2 (TWO) TIMES DAILY.  60 each  3  . ALPRAZolam (XANAX) 0.25 MG tablet Take 0.25 mg by mouth 2 (two) times daily.      Marland Kitchen aspirin 325 MG tablet Take 325 mg by  mouth daily.      . Budesonide (UCERIS) 9 MG TB24 1 po qd  30 tablet  0  . Calcium Carbonate-Vitamin D (CALCIUM 600+D) 600-400 MG-UNIT per tablet Take 1 tablet by mouth daily.      . carvedilol (COREG) 12.5 MG tablet TAKE 1 TABLET BY MOUTH TWICE A DAY WITH A MEAL  180 tablet  2  . diphenoxylate-atropine (LOMOTIL) 2.5-0.025 MG per tablet Take 1 tablet by mouth 4 (four) times daily as needed for diarrhea or loose stools.  60 tablet  0  . furosemide (LASIX) 20 MG tablet Take one  tablet every day  90 tablet  4  . losartan (COZAAR) 100 MG tablet Take 1 tablet (100 mg total) by mouth daily.  90 tablet  3  . Multiple Vitamins-Minerals (MULTIVITAMIN PO) Take 1 each by mouth daily.      Marland Kitchen spironolactone (ALDACTONE) 25 MG tablet Take 0.5 tablets (12.5 mg total) by mouth daily.  30 tablet  11   No current  facility-administered medications on file prior to visit.   BP 140/80  Pulse 76  Temp(Src) 97.6 F (36.4 C) (Oral)  Resp 16  Wt 135 lb (61.236 kg)   Review of Systems  Constitutional: Negative for activity change, appetite change, fatigue and unexpected weight change.  HENT: Positive for congestion. Negative for mouth sores and sinus pressure.   Eyes: Negative for visual disturbance.  Respiratory: Negative for chest tightness.   Gastrointestinal: Negative for nausea.  Genitourinary: Negative for frequency, difficulty urinating and vaginal pain.  Musculoskeletal: Positive for gait problem and joint swelling (L knee hurts). Negative for back pain.  Skin: Negative for pallor.  Neurological: Negative for dizziness, tremors, weakness and numbness.  Psychiatric/Behavioral: Negative for suicidal ideas, confusion and sleep disturbance. The patient is not nervous/anxious.        Objective:   Physical Exam  Constitutional: She appears well-developed and well-nourished. No distress.  HENT:  Head: Normocephalic.  Right Ear: External ear normal.  Left Ear: External ear normal.  Nose: Nose normal.  Nasal mucosa is swollen  Eyes: Conjunctivae are normal. Pupils are equal, round, and reactive to light. Right eye exhibits no discharge. Left eye exhibits no discharge.  Neck: Normal range of motion. Neck supple. No JVD present. No tracheal deviation present. No thyromegaly present.  Cardiovascular: Normal rate, regular rhythm and normal heart sounds.   Pulmonary/Chest: No stridor. No respiratory distress. She has no wheezes.  Abdominal: Soft. Bowel sounds are normal. She exhibits no distension and no mass. There is no tenderness. There is no rebound and no guarding.  Musculoskeletal: She exhibits tenderness (L knee). She exhibits no edema.  Lymphadenopathy:    She has no cervical adenopathy.  Neurological: She displays normal reflexes. No cranial nerve deficit. She exhibits normal muscle tone.  Coordination normal.  Skin: No rash noted. No erythema.  Psychiatric: She has a normal mood and affect. Her behavior is normal. Judgment and thought content normal.  L knee w/a post-op scar B wax  Lab Results  Component Value Date   WBC 5.3 03/05/2013   HGB 12.5 03/05/2013   HCT 37.2 03/05/2013   PLT 245.0 03/05/2013   GLUCOSE 120* 02/06/2014   CHOL 172 03/23/2011   TRIG 78.0 03/23/2011   HDL 63.80 03/23/2011   LDLCALC 93 03/23/2011   ALT 21 02/06/2014   AST 31 02/06/2014   NA 138 02/06/2014   K 4.7 02/06/2014   CL 101 02/06/2014   CREATININE 1.2 02/06/2014  BUN 26* 02/06/2014   CO2 31 02/06/2014   TSH 0.61 02/06/2014   INR 0.95 10/25/2011   HGBA1C 5.8 02/06/2014   MICROALBUR 0.6 03/23/2011          Assessment & Plan:

## 2014-06-09 DIAGNOSIS — D0462 Carcinoma in situ of skin of left upper limb, including shoulder: Secondary | ICD-10-CM | POA: Diagnosis not present

## 2014-06-10 ENCOUNTER — Ambulatory Visit (INDEPENDENT_AMBULATORY_CARE_PROVIDER_SITE_OTHER): Payer: Medicare Other | Admitting: Cardiovascular Disease

## 2014-06-10 ENCOUNTER — Encounter: Payer: Self-pay | Admitting: Cardiovascular Disease

## 2014-06-10 VITALS — BP 140/70 | HR 60 | Ht 63.0 in | Wt 135.1 lb

## 2014-06-10 DIAGNOSIS — I5022 Chronic systolic (congestive) heart failure: Secondary | ICD-10-CM

## 2014-06-10 DIAGNOSIS — R0609 Other forms of dyspnea: Secondary | ICD-10-CM

## 2014-06-10 DIAGNOSIS — I491 Atrial premature depolarization: Secondary | ICD-10-CM

## 2014-06-10 DIAGNOSIS — I10 Essential (primary) hypertension: Secondary | ICD-10-CM | POA: Diagnosis not present

## 2014-06-10 NOTE — Assessment & Plan Note (Signed)
Asymptomatic no evidence of afib continue beta blocker

## 2014-06-10 NOTE — Assessment & Plan Note (Signed)
Well controlled.  Continue current medications and low sodium Dash type diet.    

## 2014-06-10 NOTE — Patient Instructions (Signed)
Your physician wants you to follow-up in:  6 MONTHS WITH DR [name]  You will receive a reminder letter in the mail two months in advance. If you don't receive a letter, please call our office to schedule the follow-up appointment. Your physician recommends that you continue on your current medications as directed. Please refer to the Current Medication list given to you today. 

## 2014-06-10 NOTE — Progress Notes (Signed)
Patient ID: Mia Contreras, female   DOB: [birth year], 80 y.o.   MRN: [id number] KATHRYNNE EKBLAD is a 80 y.o. female with a history of nonischemic cardiomyopathy, systolic CHF, PACs, breast cancer. She was recently seen by Dr. Jenkins Rouge and noted to have increased dyspnea. BNP was elevaetd and she was placed on Lasix. I saw her in f/u 08/22/13. She continued to noted DOE. She admitted to being out of her Advair for ~ 2 mos. I refilled this. Her BNP remained elevated on Lasix and I asked her to follow up today.  Lexiscan Myoview (04/2011): No ischemia, EF 32%.   Echocardiogram (12/2012): EF 20%, diffuse HK, diastolic dysfunction, aortic sclerosis without stenosis, mild MR, mild to moderate LAE, mildly reduced RVSF, PASP 38.  She was recently placed on a Z-Pak for sinusitis. She continues to note NYHA class 2-2b dyspnea. There has been no improvement with resuming Advair. She denies chest pain, syncope, orthopnea, PND or edema.  Lots of questions about risk of MI and signs of worsening CHF  Weighs herself at home biweekly 135 lbs  Stable no edema   ROS: Denies fever, malais, weight loss, blurry vision, decreased visual acuity, cough, sputum, SOB, hemoptysis, pleuritic pain, palpitaitons, heartburn, abdominal pain, melena, lower extremity edema, claudication, or rash.  All other systems reviewed and negative  General: Affect appropriate Healthy:  appears stated age 27: normal Neck supple with no adenopathy JVP normal no bruits no thyromegaly Lungs clear with no wheezing and good diaphragmatic motion Heart:  S1/S2 SEM  murmur, no rub, gallop or click PMI normal Abdomen: benighn, BS positve, no tenderness, no AAA no bruit.  No HSM or HJR Distal pulses intact with no bruits No edema Neuro non-focal Skin warm and dry No muscular weakness   Current Outpatient Prescriptions  Medication Sig Dispense Refill  . ADVAIR DISKUS 250-50 MCG/DOSE AEPB INHALE 1 PUFF INTO THE LUNGS 2 (TWO) TIMES DAILY.   60 each  3  . ALPRAZolam (XANAX) 0.25 MG tablet Take 1 tablet (0.25 mg total) by mouth 2 (two) times daily.  60 tablet  3  . aspirin 325 MG tablet Take 325 mg by mouth daily.      . Budesonide (UCERIS) 9 MG TB24 1 po qd  30 tablet  0  . Calcium Carbonate-Vitamin D (CALCIUM 600+D) 600-400 MG-UNIT per tablet Take 1 tablet by mouth daily.      . carvedilol (COREG) 12.5 MG tablet TAKE 1 TABLET BY MOUTH TWICE A DAY WITH A MEAL  180 tablet  2  . diphenoxylate-atropine (LOMOTIL) 2.5-0.025 MG per tablet Take 1 tablet by mouth 4 (four) times daily as needed for diarrhea or loose stools.  60 tablet  0  . furosemide (LASIX) 20 MG tablet Take one  tablet every day  90 tablet  3  . losartan (COZAAR) 100 MG tablet Take 1 tablet (100 mg total) by mouth daily.  90 tablet  3  . Multiple Vitamins-Minerals (MULTIVITAMIN PO) Take 1 each by mouth daily.      Marland Kitchen spironolactone (ALDACTONE) 25 MG tablet Take 0.5 tablets (12.5 mg total) by mouth daily.  30 tablet  11   No current facility-administered medications for this visit.    Allergies  Cefuroxime; Celecoxib; Codeine; Ranitidine; and Tramadol hcl  Electrocardiogram:  SR rate 58 LAD old IMI  Long PR  PAC  Assessment and Plan

## 2014-06-10 NOTE — Assessment & Plan Note (Signed)
Related to COPD at this point  Heart compensated Has had new pneumonia vaccine and flu shot No active wheezing stable

## 2014-06-10 NOTE — Assessment & Plan Note (Signed)
Euvolemic on lasix and aldactone  Functional class 1-2  Continue current meds

## 2014-06-11 ENCOUNTER — Telehealth: Payer: Self-pay | Admitting: Internal Medicine

## 2014-06-11 NOTE — Telephone Encounter (Signed)
Pt has Medicare- I advised her to come to OV first and if labs are needed, MD will send her after OV.

## 2014-06-11 NOTE — Telephone Encounter (Signed)
Patient would like to know if she need labs drawn before he appt in Dec??  Please advise

## 2014-06-18 DIAGNOSIS — H26493 Other secondary cataract, bilateral: Secondary | ICD-10-CM | POA: Diagnosis not present

## 2014-06-26 DIAGNOSIS — H15103 Unspecified episcleritis, bilateral: Secondary | ICD-10-CM | POA: Diagnosis not present

## 2014-07-03 ENCOUNTER — Encounter: Payer: Self-pay | Admitting: Internal Medicine

## 2014-07-03 ENCOUNTER — Ambulatory Visit (INDEPENDENT_AMBULATORY_CARE_PROVIDER_SITE_OTHER): Payer: Medicare Other | Admitting: Internal Medicine

## 2014-07-03 VITALS — BP 152/80 | HR 59 | Temp 97.6°F | Ht 63.0 in | Wt 137.2 lb

## 2014-07-03 DIAGNOSIS — S81801A Unspecified open wound, right lower leg, initial encounter: Secondary | ICD-10-CM | POA: Diagnosis not present

## 2014-07-03 DIAGNOSIS — F411 Generalized anxiety disorder: Secondary | ICD-10-CM | POA: Diagnosis not present

## 2014-07-03 DIAGNOSIS — I10 Essential (primary) hypertension: Secondary | ICD-10-CM

## 2014-07-03 DIAGNOSIS — S81811A Laceration without foreign body, right lower leg, initial encounter: Secondary | ICD-10-CM

## 2014-07-03 NOTE — Progress Notes (Signed)
Subjective:    Patient ID: Mia Contreras, female    DOB: September 17, 1928, 80 y.o.   MRN: [id number]  HPI  Here with 1 wk c/o skin tear/wound to right mid anterior leg caused by a sharp edge of car door;  On asa 325 qd, initialy seemed to bleed much, none after first day.  No further bleeding or other overt blood loss. No fever, worsening redness or swelling, No drainage. A skin flap aspect of the wound is small and "drying up", almost fallen off. Treated so far with medicated bandaid, just wanted it checked today.  Pt denies chest pain, increased sob or doe, wheezing, orthopnea, PND, increased LE swelling, palpitations, dizziness or syncope. Declines tetanus. Denies worsening depressive symptoms, suicidal ideation, or panic Past Medical History  Diagnosis Date  . GOITER, MULTINODULAR 05/06/2010    Dr [name]  . HYPERTHYROIDISM 05/23/2010  . HYPERLIPIDEMIA 08/20/2007  . ANEMIA-NOS 08/20/2007  . ANXIETY 03/16/2007  . CARPAL TUNNEL SYNDROME, RIGHT 01/11/2008  . HYPERTENSION 03/16/2007  . VENOUS INSUFFICIENCY 09/05/2007  . DIVERTICULOSIS, COLON 08/20/2007  . CELLULITIS, LEG, RIGHT 08/20/2007  . OSTEOARTHRITIS 03/16/2007  . OSTEOPOROSIS 08/20/2007  . SCOLIOSIS 01/11/2008  . BREAST CANCER, HX OF 03/16/2007  . SKIN CANCER, HX OF 11/12/2008     R cheek 2010, L Cheek 2011 Dr. Tonia Brooms  . PREMATURE ATRIAL CONTRACTIONS 09/21/2010  . Thyroid nodule   . Cataract     floaters  . Pneumonia 07/2011    took abx for several weeks  . IBS (irritable bowel syndrome)   . Collagenous colitis    Past Surgical History  Procedure Laterality Date  . Total knee arthroplasty    . Breast lumpectomy Left 2001  . Abdominal hysterectomy    . Appendectomy  1972  . Mastectomy, partial Left   . Carpal tunnel release    . Total knee arthroplasty Right 2010    Dr Para March  . Cataract extraction    . Skin cancer excision      face-Dr. Bing Plume  . Xrt      chemo, surgery  . Eye surgery      cataract ext  . Total knee  arthroplasty  10/30/2011    Procedure: TOTAL KNEE ARTHROPLASTY;  Surgeon: Lorn Junes, MD;  Location: [facility];  Service: Orthopedics;  Laterality: Left;     . Hernia repair      reports that she quit smoking about 33 years ago. She has never used smokeless tobacco. She reports that she drinks alcohol. She reports that she does not use illicit drugs. family history includes Alcohol abuse in her father; Alzheimer's disease in her mother; Dementia in her mother; Diabetes in her other; Heart attack in her father; Heart disease in her mother; Hypertension in her mother; Mental retardation in her mother; Prostate cancer in her brother. There is no history of Colon cancer or Anesthesia problems. Allergies  Allergen Reactions  . Cefuroxime Diarrhea  . Celecoxib Nausea Only  . Codeine Nausea Only  . Ranitidine     bloating  . Tramadol Hcl Nausea Only   Current Outpatient Prescriptions on File Prior to Visit  Medication Sig Dispense Refill  . ADVAIR DISKUS 250-50 MCG/DOSE AEPB INHALE 1 PUFF INTO THE LUNGS 2 (TWO) TIMES DAILY. 60 each 3  . ALPRAZolam (XANAX) 0.25 MG tablet Take 1 tablet (0.25 mg total) by mouth 2 (two) times daily. 60 tablet 3  . aspirin 325 MG tablet Take 325 mg by mouth daily.    Marland Kitchen  Budesonide (UCERIS) 9 MG TB24 1 po qd 30 tablet 0  . Calcium Carbonate-Vitamin D (CALCIUM 600+D) 600-400 MG-UNIT per tablet Take 1 tablet by mouth daily.    . carvedilol (COREG) 12.5 MG tablet TAKE 1 TABLET BY MOUTH TWICE A DAY WITH A MEAL 180 tablet 2  . diphenoxylate-atropine (LOMOTIL) 2.5-0.025 MG per tablet Take 1 tablet by mouth 4 (four) times daily as needed for diarrhea or loose stools. 60 tablet 0  . furosemide (LASIX) 20 MG tablet Take one  tablet every day 90 tablet 3  . losartan (COZAAR) 100 MG tablet Take 1 tablet (100 mg total) by mouth daily. 90 tablet 3  . Multiple Vitamins-Minerals (MULTIVITAMIN PO) Take 1 each by mouth daily.    Marland Kitchen spironolactone (ALDACTONE) 25 MG tablet Take 0.5  tablets (12.5 mg total) by mouth daily. 30 tablet 11   No current facility-administered medications on file prior to visit.     Review of Systems All otherwise neg per pt     Objective:   Physical Exam BP 152/80 mmHg  Pulse 59  Temp(Src) 97.6 F (36.4 C) (Oral)  Ht 5\' 3"  (1.6 m)  Wt 137 lb 4 oz (62.256 kg)  BMI 24.32 kg/m2  SpO2 92% VS noted,  Constitutional: Pt appears well-developed, well-nourished.  HENT: Head: NCAT.  Right Ear: External ear normal.  Left Ear: External ear normal.  Eyes: . Pupils are equal, round, and reactive to light. Conjunctivae and EOM are normal Neck: Normal range of motion. Neck supple.  Cardiovascular: Normal rate and regular rhythm.   Pulmonary/Chest: Effort normal and breath sounds normal.  Neurological: Pt is alert. Not confused , motor grossly intact Skin: right mid ant pretibial 1 cm area skin tear with surrounding mild bruising and slight erythema distal to tear, but no other redness,tender, red streaks or drainage Psychiatric: Pt behavior is normal. No agitation. mild nervous    Assessment & Plan:

## 2014-07-03 NOTE — Progress Notes (Signed)
Pre visit review using our clinic review tool, if applicable. No additional management support is needed unless otherwise documented below in the visit note. 

## 2014-07-03 NOTE — Patient Instructions (Signed)
OK to continue your BandAid as you do  Please continue all other medications as before, and refills have been done if requested.  Please have the pharmacy call with any other refills you may need.

## 2014-07-04 NOTE — Assessment & Plan Note (Signed)
Benign exam, pt educated and reassured, ok to cont bandaid or gauze cover with neosporin, but o/w  to f/u any worsening symptoms or concerns, no s/s infection today

## 2014-07-04 NOTE — Assessment & Plan Note (Signed)
Mild elev today likely situational, o./w stable overall by history and exam, recent data reviewed with pt, and pt to continue medical treatment as before,  to f/u any worsening symptoms or concerns BP Readings from Last 3 Encounters:  07/03/14 152/80  06/10/14 140/70  05/08/14 140/80

## 2014-07-04 NOTE — Assessment & Plan Note (Signed)
stable overall by history and exam, and pt to continue medical treatment as before,  to f/u any worsening symptoms or concerns, denies SI

## 2014-07-06 ENCOUNTER — Encounter: Payer: Self-pay | Admitting: Internal Medicine

## 2014-07-06 ENCOUNTER — Ambulatory Visit (INDEPENDENT_AMBULATORY_CARE_PROVIDER_SITE_OTHER): Payer: Medicare Other | Admitting: Internal Medicine

## 2014-07-06 VITALS — BP 140/80 | HR 71 | Temp 98.1°F | Ht 63.0 in | Wt 136.4 lb

## 2014-07-06 DIAGNOSIS — L02419 Cutaneous abscess of limb, unspecified: Secondary | ICD-10-CM

## 2014-07-06 DIAGNOSIS — L03119 Cellulitis of unspecified part of limb: Secondary | ICD-10-CM

## 2014-07-06 MED ORDER — MUPIROCIN 2 % EX OINT: TOPICAL_OINTMENT | CUTANEOUS | Status: DC

## 2014-07-06 MED ORDER — DOXYCYCLINE HYCLATE 100 MG PO TABS: 100.0000 mg | ORAL_TABLET | Freq: Two times a day (BID) | ORAL | Status: DC

## 2014-07-06 NOTE — Progress Notes (Signed)
Pre visit review using our clinic review tool, if applicable. No additional management support is needed unless otherwise documented below in the visit note. 

## 2014-07-06 NOTE — Progress Notes (Signed)
Subjective:    HPI  C/o redness around BJ's recent laceration on R leg  F/u diarrhea came back 2 wks ago 2/d - loose. Prednisone helped in the past - last year. Last FlexSig in 2014 Dr Sharlett Iles  SOB w/exertion - better  F/u wound on L lower leg - injured w/a branch again < 12 week ago  F/u fatigue, not sleeping well  The patient presents for a follow-up of  Chronic CHF, hypertension, chronic dyslipidemia, knee OA - not controlled with medicines. No wt loss. BP is nl at home.    Wt Readings from Last 3 Encounters:  07/06/14 136 lb 6.4 oz (61.871 kg)  07/03/14 137 lb 4 oz (62.256 kg)  06/10/14 135 lb 1.9 oz (61.29 kg)   BP Readings from Last 3 Encounters:  07/06/14 140/80  07/03/14 152/80  06/10/14 140/70      Past Medical History  Diagnosis Date  . GOITER, MULTINODULAR 05/06/2010    Dr [name]  . HYPERTHYROIDISM 05/23/2010  . HYPERLIPIDEMIA 08/20/2007  . ANEMIA-NOS 08/20/2007  . ANXIETY 03/16/2007  . CARPAL TUNNEL SYNDROME, RIGHT 01/11/2008  . HYPERTENSION 03/16/2007  . VENOUS INSUFFICIENCY 09/05/2007  . DIVERTICULOSIS, COLON 08/20/2007  . CELLULITIS, LEG, RIGHT 08/20/2007  . OSTEOARTHRITIS 03/16/2007  . OSTEOPOROSIS 08/20/2007  . SCOLIOSIS 01/11/2008  . BREAST CANCER, HX OF 03/16/2007  . SKIN CANCER, HX OF 11/12/2008     R cheek 2010, L Cheek 2011 Dr. Tonia Brooms  . PREMATURE ATRIAL CONTRACTIONS 09/21/2010  . Thyroid nodule   . Cataract     floaters  . Pneumonia 07/2011    took abx for several weeks  . IBS (irritable bowel syndrome)   . Collagenous colitis    Past Surgical History  Procedure Laterality Date  . Total knee arthroplasty    . Breast lumpectomy Left 2001  . Abdominal hysterectomy    . Appendectomy  1972  . Mastectomy, partial Left   . Carpal tunnel release    . Total knee arthroplasty Right 2010    Dr Para March  . Cataract extraction    . Skin cancer excision      face-Dr. Bing Plume  . Xrt      chemo, surgery  . Eye surgery      cataract ext  .  Total knee arthroplasty  10/30/2011    Procedure: TOTAL KNEE ARTHROPLASTY;  Surgeon: Lorn Junes, MD;  Location: [facility];  Service: Orthopedics;  Laterality: Left;     . Hernia repair      reports that she quit smoking about 33 years ago. She has never used smokeless tobacco. She reports that she drinks alcohol. She reports that she does not use illicit drugs. family history includes Alcohol abuse in her father; Alzheimer's disease in her mother; Dementia in her mother; Diabetes in her other; Heart attack in her father; Heart disease in her mother; Hypertension in her mother; Mental retardation in her mother; Prostate cancer in her brother. There is no history of Colon cancer or Anesthesia problems. Allergies  Allergen Reactions  . Cefuroxime Diarrhea  . Celecoxib Nausea Only  . Codeine Nausea Only  . Ranitidine     bloating  . Tramadol Hcl Nausea Only   Current Outpatient Prescriptions on File Prior to Visit  Medication Sig Dispense Refill  . ADVAIR DISKUS 250-50 MCG/DOSE AEPB INHALE 1 PUFF INTO THE LUNGS 2 (TWO) TIMES DAILY. 60 each 3  . ALPRAZolam (XANAX) 0.25 MG tablet Take 1 tablet (0.25 mg total) by mouth 2 (  two) times daily. 60 tablet 3  . aspirin 325 MG tablet Take 325 mg by mouth daily.    . Budesonide (UCERIS) 9 MG TB24 1 po qd 30 tablet 0  . Calcium Carbonate-Vitamin D (CALCIUM 600+D) 600-400 MG-UNIT per tablet Take 1 tablet by mouth daily.    . carvedilol (COREG) 12.5 MG tablet TAKE 1 TABLET BY MOUTH TWICE A DAY WITH A MEAL 180 tablet 2  . diphenoxylate-atropine (LOMOTIL) 2.5-0.025 MG per tablet Take 1 tablet by mouth 4 (four) times daily as needed for diarrhea or loose stools. 60 tablet 0  . furosemide (LASIX) 20 MG tablet Take one  tablet every day 90 tablet 3  . losartan (COZAAR) 100 MG tablet Take 1 tablet (100 mg total) by mouth daily. 90 tablet 3  . Multiple Vitamins-Minerals (MULTIVITAMIN PO) Take 1 each by mouth daily.    Marland Kitchen spironolactone (ALDACTONE) 25 MG tablet Take  0.5 tablets (12.5 mg total) by mouth daily. 30 tablet 11   No current facility-administered medications on file prior to visit.   BP 140/80 mmHg  Pulse 71  Temp(Src) 98.1 F (36.7 C) (Oral)  Ht 5\' 3"  (1.6 m)  Wt 136 lb 6.4 oz (61.871 kg)  BMI 24.17 kg/m2  SpO2 94%   Review of Systems  Constitutional: Negative for activity change, appetite change, fatigue and unexpected weight change.  HENT: Positive for congestion. Negative for mouth sores and sinus pressure.   Eyes: Negative for visual disturbance.  Respiratory: Negative for chest tightness.   Gastrointestinal: Negative for nausea.  Genitourinary: Negative for frequency, difficulty urinating and vaginal pain.  Musculoskeletal: Positive for joint swelling (L knee hurts) and gait problem. Negative for back pain.  Skin: Negative for pallor.  Neurological: Negative for dizziness, tremors, weakness and numbness.  Psychiatric/Behavioral: Negative for suicidal ideas, confusion and sleep disturbance. The patient is not nervous/anxious.        Objective:   Physical Exam  Constitutional: She appears well-developed. No distress.  HENT:  Head: Normocephalic.  Right Ear: External ear normal.  Left Ear: External ear normal.  Nose: Nose normal.  Mouth/Throat: Oropharynx is clear and moist.  Eyes: Conjunctivae are normal. Pupils are equal, round, and reactive to light. Right eye exhibits no discharge. Left eye exhibits no discharge.  Neck: Normal range of motion. Neck supple. No JVD present. No tracheal deviation present. No thyromegaly present.  Cardiovascular: Normal rate, regular rhythm and normal heart sounds.   Pulmonary/Chest: No stridor. No respiratory distress. She has no wheezes.  Abdominal: Soft. Bowel sounds are normal. She exhibits no distension and no mass. There is no tenderness. There is no rebound and no guarding.  Musculoskeletal: She exhibits no edema or tenderness.  Lymphadenopathy:    She has no cervical adenopathy.   Neurological: She displays normal reflexes. No cranial nerve deficit. She exhibits normal muscle tone. Coordination normal.  Skin: No rash noted. No erythema.  Psychiatric: She has a normal mood and affect. Her behavior is normal. Judgment and thought content normal.  L knee w/a post-op scar R lat lower shin w/a V shaped granulating  laceration 1.5x1.5 cm and redness 12x9 cm around it   Lab Results  Component Value Date   WBC 5.3 03/05/2013   HGB 12.5 03/05/2013   HCT 37.2 03/05/2013   PLT 245.0 03/05/2013   GLUCOSE 120* 02/06/2014   CHOL 172 03/23/2011   TRIG 78.0 03/23/2011   HDL 63.80 03/23/2011   LDLCALC 93 03/23/2011   ALT 21 02/06/2014  AST 31 02/06/2014   NA 138 02/06/2014   K 4.7 02/06/2014   CL 101 02/06/2014   CREATININE 1.2 02/06/2014   BUN 26* 02/06/2014   CO2 31 02/06/2014   TSH 0.61 02/06/2014   INR 0.95 10/25/2011   HGBA1C 5.8 02/06/2014   MICROALBUR 0.6 03/23/2011          Assessment & Plan:

## 2014-07-06 NOTE — Assessment & Plan Note (Signed)
Worse  PO abx Abx cream

## 2014-07-15 ENCOUNTER — Encounter: Payer: Self-pay | Admitting: Internal Medicine

## 2014-07-15 ENCOUNTER — Ambulatory Visit (INDEPENDENT_AMBULATORY_CARE_PROVIDER_SITE_OTHER): Payer: Medicare Other | Admitting: Internal Medicine

## 2014-07-15 VITALS — BP 130/78 | HR 58 | Temp 97.8°F | Wt 135.0 lb

## 2014-07-15 DIAGNOSIS — S81001D Unspecified open wound, right knee, subsequent encounter: Secondary | ICD-10-CM

## 2014-07-15 DIAGNOSIS — I10 Essential (primary) hypertension: Secondary | ICD-10-CM | POA: Diagnosis not present

## 2014-07-15 DIAGNOSIS — S91001D Unspecified open wound, right ankle, subsequent encounter: Secondary | ICD-10-CM | POA: Diagnosis not present

## 2014-07-15 DIAGNOSIS — E785 Hyperlipidemia, unspecified: Secondary | ICD-10-CM | POA: Diagnosis not present

## 2014-07-15 DIAGNOSIS — R110 Nausea: Secondary | ICD-10-CM | POA: Diagnosis not present

## 2014-07-15 DIAGNOSIS — S81801D Unspecified open wound, right lower leg, subsequent encounter: Secondary | ICD-10-CM

## 2014-07-15 NOTE — Assessment & Plan Note (Signed)
11/15 due to Doxy D/c Doxy

## 2014-07-15 NOTE — Progress Notes (Signed)
Pre visit review using our clinic review tool, if applicable. No additional management support is needed unless otherwise documented below in the visit note. 

## 2014-07-15 NOTE — Assessment & Plan Note (Signed)
Cont topical care Better

## 2014-07-15 NOTE — Progress Notes (Signed)
Subjective:    HPI  F/u redness around BJ's recent laceration on R leg - better. C/o nausea w/Doxy  F/u diarrhea came back 2 wks ago 2/d - loose. Prednisone helped in the past - last year. Last FlexSig in 2014 Dr Sharlett Iles  SOB w/exertion - better  F/u wound on L lower leg - injured w/a branch again < 12 week ago  F/u fatigue, not sleeping well  The patient presents for a follow-up of  Chronic CHF, hypertension, chronic dyslipidemia, knee OA - not controlled with medicines. No wt loss. BP is nl at home.    Wt Readings from Last 3 Encounters:  07/15/14 135 lb (61.236 kg)  07/06/14 136 lb 6.4 oz (61.871 kg)  07/03/14 137 lb 4 oz (62.256 kg)   BP Readings from Last 3 Encounters:  07/15/14 130/78  07/06/14 140/80  07/03/14 152/80      Past Medical History  Diagnosis Date  . GOITER, MULTINODULAR 05/06/2010    Dr [name]  . HYPERTHYROIDISM 05/23/2010  . HYPERLIPIDEMIA 08/20/2007  . ANEMIA-NOS 08/20/2007  . ANXIETY 03/16/2007  . CARPAL TUNNEL SYNDROME, RIGHT 01/11/2008  . HYPERTENSION 03/16/2007  . VENOUS INSUFFICIENCY 09/05/2007  . DIVERTICULOSIS, COLON 08/20/2007  . CELLULITIS, LEG, RIGHT 08/20/2007  . OSTEOARTHRITIS 03/16/2007  . OSTEOPOROSIS 08/20/2007  . SCOLIOSIS 01/11/2008  . BREAST CANCER, HX OF 03/16/2007  . SKIN CANCER, HX OF 11/12/2008     R cheek 2010, L Cheek 2011 Dr. Tonia Brooms  . PREMATURE ATRIAL CONTRACTIONS 09/21/2010  . Thyroid nodule   . Cataract     floaters  . Pneumonia 07/2011    took abx for several weeks  . IBS (irritable bowel syndrome)   . Collagenous colitis    Past Surgical History  Procedure Laterality Date  . Total knee arthroplasty    . Breast lumpectomy Left 2001  . Abdominal hysterectomy    . Appendectomy  1972  . Mastectomy, partial Left   . Carpal tunnel release    . Total knee arthroplasty Right 2010    Dr Para March  . Cataract extraction    . Skin cancer excision      face-Dr. Bing Plume  . Xrt      chemo, surgery  . Eye surgery       cataract ext  . Total knee arthroplasty  10/30/2011    Procedure: TOTAL KNEE ARTHROPLASTY;  Surgeon: Lorn Junes, MD;  Location: [place];  Service: Orthopedics;  Laterality: Left;     . Hernia repair      reports that she quit smoking about 33 years ago. She has never used smokeless tobacco. She reports that she drinks alcohol. She reports that she does not use illicit drugs. family history includes Alcohol abuse in her father; Alzheimer's disease in her mother; Dementia in her mother; Diabetes in her other; Heart attack in her father; Heart disease in her mother; Hypertension in her mother; Mental retardation in her mother; Prostate cancer in her brother. There is no history of Colon cancer or Anesthesia problems. Allergies  Allergen Reactions  . Cefuroxime Diarrhea  . Celecoxib Nausea Only  . Codeine Nausea Only  . Ranitidine     bloating  . Tramadol Hcl Nausea Only   Current Outpatient Prescriptions on File Prior to Visit  Medication Sig Dispense Refill  . ADVAIR DISKUS 250-50 MCG/DOSE AEPB INHALE 1 PUFF INTO THE LUNGS 2 (TWO) TIMES DAILY. 60 each 3  . ALPRAZolam (XANAX) 0.25 MG tablet Take 1 tablet (0.25 mg total)  by mouth 2 (two) times daily. 60 tablet 3  . aspirin 325 MG tablet Take 325 mg by mouth daily.    . Calcium Carbonate-Vitamin D (CALCIUM 600+D) 600-400 MG-UNIT per tablet Take 1 tablet by mouth daily.    . carvedilol (COREG) 12.5 MG tablet TAKE 1 TABLET BY MOUTH TWICE A DAY WITH A MEAL 180 tablet 2  . diphenoxylate-atropine (LOMOTIL) 2.5-0.025 MG per tablet Take 1 tablet by mouth 4 (four) times daily as needed for diarrhea or loose stools. 60 tablet 0  . doxycycline (VIBRA-TABS) 100 MG tablet Take 1 tablet (100 mg total) by mouth 2 (two) times daily. 20 tablet 0  . furosemide (LASIX) 20 MG tablet Take one  tablet every day 90 tablet 3  . losartan (COZAAR) 100 MG tablet Take 1 tablet (100 mg total) by mouth daily. 90 tablet 3  . Multiple Vitamins-Minerals (MULTIVITAMIN  PO) Take 1 each by mouth daily.    . mupirocin ointment (BACTROBAN) 2 % Use qd or bid 22 g 0  . spironolactone (ALDACTONE) 25 MG tablet Take 0.5 tablets (12.5 mg total) by mouth daily. 30 tablet 11   No current facility-administered medications on file prior to visit.   BP 130/78 mmHg  Pulse 58  Temp(Src) 97.8 F (36.6 C) (Oral)  Wt 135 lb (61.236 kg)  SpO2 95%   Review of Systems  Constitutional: Negative for activity change, appetite change, fatigue and unexpected weight change.  HENT: Positive for congestion. Negative for mouth sores and sinus pressure.   Eyes: Negative for visual disturbance.  Respiratory: Negative for chest tightness.   Gastrointestinal: Negative for nausea.  Genitourinary: Negative for frequency, difficulty urinating and vaginal pain.  Musculoskeletal: Positive for joint swelling (L knee hurts) and gait problem. Negative for back pain.  Skin: Negative for pallor.  Neurological: Negative for dizziness, tremors, weakness and numbness.  Psychiatric/Behavioral: Negative for suicidal ideas, confusion and sleep disturbance. The patient is not nervous/anxious.        Objective:   Physical Exam  Constitutional: She appears well-developed. No distress.  HENT:  Head: Normocephalic.  Right Ear: External ear normal.  Left Ear: External ear normal.  Nose: Nose normal.  Mouth/Throat: Oropharynx is clear and moist.  Eyes: Conjunctivae are normal. Pupils are equal, round, and reactive to light. Right eye exhibits no discharge. Left eye exhibits no discharge.  Neck: Normal range of motion. Neck supple. No JVD present. No tracheal deviation present. No thyromegaly present.  Cardiovascular: Normal rate, regular rhythm and normal heart sounds.   Pulmonary/Chest: No stridor. No respiratory distress. She has no wheezes.  Abdominal: Soft. Bowel sounds are normal. She exhibits no distension and no mass. There is no tenderness. There is no rebound and no guarding.   Musculoskeletal: She exhibits no edema or tenderness.  Lymphadenopathy:    She has no cervical adenopathy.  Neurological: She displays normal reflexes. No cranial nerve deficit. She exhibits normal muscle tone. Coordination normal.  Skin: No rash noted. No erythema.  Psychiatric: She has a normal mood and affect. Her behavior is normal. Judgment and thought content normal.  L knee w/a post-op scar R lat lower shin w/a V shaped granulating  laceration 1.5x1.5 cm and redness 4x3 cm around it - much better   Lab Results  Component Value Date   WBC 5.3 03/05/2013   HGB 12.5 03/05/2013   HCT 37.2 03/05/2013   PLT 245.0 03/05/2013   GLUCOSE 120* 02/06/2014   CHOL 172 03/23/2011   TRIG 78.0 03/23/2011  HDL 63.80 03/23/2011   LDLCALC 93 03/23/2011   ALT 21 02/06/2014   AST 31 02/06/2014   NA 138 02/06/2014   K 4.7 02/06/2014   CL 101 02/06/2014   CREATININE 1.2 02/06/2014   BUN 26* 02/06/2014   CO2 31 02/06/2014   TSH 0.61 02/06/2014   INR 0.95 10/25/2011   HGBA1C 5.8 02/06/2014   MICROALBUR 0.6 03/23/2011          Assessment & Plan:  Patient ID: Mia Contreras, female   DOB: Oct 08, 1928, 80 y.o.   MRN: [id number]

## 2014-07-23 DIAGNOSIS — H26491 Other secondary cataract, right eye: Secondary | ICD-10-CM | POA: Diagnosis not present

## 2014-07-30 ENCOUNTER — Encounter: Payer: Self-pay | Admitting: Endocrinology

## 2014-07-30 ENCOUNTER — Ambulatory Visit (INDEPENDENT_AMBULATORY_CARE_PROVIDER_SITE_OTHER): Payer: Medicare Other | Admitting: Endocrinology

## 2014-07-30 VITALS — BP 126/80 | HR 65 | Temp 97.5°F | Ht 63.0 in | Wt 137.0 lb

## 2014-07-30 DIAGNOSIS — E0520 Thyrotoxicosis with toxic multinodular goiter without thyrotoxic crisis or storm: Secondary | ICD-10-CM

## 2014-07-30 NOTE — Patient Instructions (Addendum)
blood tests are being requested for you today.  We'll let you know about the results. Please return in 1 year.  most of the time, a "lumpy thyroid" will eventually become overactive again.  this is usually a slow process, happening over the span of many years.

## 2014-07-30 NOTE — Progress Notes (Signed)
Subjective:    Patient ID: Mia Contreras, female    DOB: [birth year], 80 y.o.   MRN: [id number]  HPI In 2011, pt had i-131 rx for hyperthyroidism, due to a large multinodular goiter.  She became euthyroid, and has not required any medication.  She did not need bx.  She does not notice the goiter.  pt states she feels well in general, except for insomnia.  Past Medical History  Diagnosis Date  . GOITER, MULTINODULAR 05/06/2010    Dr [name]  . HYPERTHYROIDISM 05/23/2010  . HYPERLIPIDEMIA 08/20/2007  . ANEMIA-NOS 08/20/2007  . ANXIETY 03/16/2007  . CARPAL TUNNEL SYNDROME, RIGHT 01/11/2008  . HYPERTENSION 03/16/2007  . VENOUS INSUFFICIENCY 09/05/2007  . DIVERTICULOSIS, COLON 08/20/2007  . CELLULITIS, LEG, RIGHT 08/20/2007  . OSTEOARTHRITIS 03/16/2007  . OSTEOPOROSIS 08/20/2007  . SCOLIOSIS 01/11/2008  . BREAST CANCER, HX OF 03/16/2007  . SKIN CANCER, HX OF 11/12/2008     R cheek 2010, L Cheek 2011 Dr. Tonia Brooms  . PREMATURE ATRIAL CONTRACTIONS 09/21/2010  . Thyroid nodule   . Cataract     floaters  . Pneumonia 07/2011    took abx for several weeks  . IBS (irritable bowel syndrome)   . Collagenous colitis     Past Surgical History  Procedure Laterality Date  . Total knee arthroplasty    . Breast lumpectomy Left 2001  . Abdominal hysterectomy    . Appendectomy  1972  . Mastectomy, partial Left   . Carpal tunnel release    . Total knee arthroplasty Right 2010    Dr Para March  . Cataract extraction    . Skin cancer excision      face-Dr. Bing Plume  . Xrt      chemo, surgery  . Eye surgery      cataract ext  . Total knee arthroplasty  10/30/2011    Procedure: TOTAL KNEE ARTHROPLASTY;  Surgeon: Lorn Junes, MD;  Location: [place];  Service: Orthopedics;  Laterality: Left;     . Hernia repair      History   Social History  . Marital Status: Single    Spouse Name: N/A    Number of Children: N/A  . Years of Education: N/A   Occupational History  . Not on file.   Social History  Main Topics  . Smoking status: Former Smoker    Quit date: 12/20/1980  . Smokeless tobacco: Never Used  . Alcohol Use: Yes     Comment: socially  . Drug Use: No  . Sexual Activity: Not Currently   Other Topics Concern  . Not on file   Social History Narrative    Current Outpatient Prescriptions on File Prior to Visit  Medication Sig Dispense Refill  . ADVAIR DISKUS 250-50 MCG/DOSE AEPB INHALE 1 PUFF INTO THE LUNGS 2 (TWO) TIMES DAILY. 60 each 3  . ALPRAZolam (XANAX) 0.25 MG tablet Take 1 tablet (0.25 mg total) by mouth 2 (two) times daily. 60 tablet 3  . aspirin 325 MG tablet Take 325 mg by mouth daily.    . Calcium Carbonate-Vitamin D (CALCIUM 600+D) 600-400 MG-UNIT per tablet Take 1 tablet by mouth daily.    . carvedilol (COREG) 12.5 MG tablet TAKE 1 TABLET BY MOUTH TWICE A DAY WITH A MEAL 180 tablet 2  . diphenoxylate-atropine (LOMOTIL) 2.5-0.025 MG per tablet Take 1 tablet by mouth 4 (four) times daily as needed for diarrhea or loose stools. 60 tablet 0  . furosemide (LASIX) 20 MG tablet Take  one  tablet every day 90 tablet 3  . losartan (COZAAR) 100 MG tablet Take 1 tablet (100 mg total) by mouth daily. 90 tablet 3  . Multiple Vitamins-Minerals (MULTIVITAMIN PO) Take 1 each by mouth daily.    . mupirocin ointment (BACTROBAN) 2 % Use qd or bid 22 g 0  . spironolactone (ALDACTONE) 25 MG tablet Take 0.5 tablets (12.5 mg total) by mouth daily. 30 tablet 11  . doxycycline (VIBRA-TABS) 100 MG tablet Take 1 tablet (100 mg total) by mouth 2 (two) times daily. (Patient not taking: Reported on 07/30/2014) 20 tablet 0   No current facility-administered medications on file prior to visit.    Allergies  Allergen Reactions  . Cefuroxime Diarrhea  . Celecoxib Nausea Only  . Codeine Nausea Only  . Ranitidine     bloating  . Tramadol Hcl Nausea Only    Family History  Problem Relation Age of Onset  . Dementia Mother   . Heart disease Mother   . Mental retardation Mother   . Colon  cancer Neg Hx   . Hypertension Mother   . Alzheimer's disease Mother   . Prostate cancer Brother   . Diabetes Other   . Heart attack Father   . Alcohol abuse Father   . Anesthesia problems Neg Hx     BP 126/80 mmHg  Pulse 65  Temp(Src) 97.5 F (36.4 C) (Oral)  Ht 5\' 3"  (1.6 m)  Wt 137 lb (62.143 kg)  BMI 24.27 kg/m2  SpO2 91%   Review of Systems Denies weight change    Objective:   Physical Exam VITAL SIGNS:  See vs page GENERAL: no distress Neck: large multinodular goiter is again noted   Lab Results  Component Value Date   TSH 2.91 07/31/2014      Assessment & Plan:  Hyperthyroidism, resolved with I-131 rx.  Patient is advised the following: Patient Instructions  blood tests are being requested for you today.  We'll let you know about the results. Please return in 1 year.  most of the time, a "lumpy thyroid" will eventually become overactive again.  this is usually a slow process, happening over the span of many years.

## 2014-07-31 ENCOUNTER — Other Ambulatory Visit (INDEPENDENT_AMBULATORY_CARE_PROVIDER_SITE_OTHER): Payer: Medicare Other

## 2014-07-31 ENCOUNTER — Other Ambulatory Visit: Payer: Medicare Other

## 2014-07-31 DIAGNOSIS — R110 Nausea: Secondary | ICD-10-CM

## 2014-07-31 DIAGNOSIS — S81001D Unspecified open wound, right knee, subsequent encounter: Secondary | ICD-10-CM

## 2014-07-31 DIAGNOSIS — E0520 Thyrotoxicosis with toxic multinodular goiter without thyrotoxic crisis or storm: Secondary | ICD-10-CM

## 2014-07-31 DIAGNOSIS — S81801D Unspecified open wound, right lower leg, subsequent encounter: Principal | ICD-10-CM

## 2014-07-31 DIAGNOSIS — S91001D Unspecified open wound, right ankle, subsequent encounter: Principal | ICD-10-CM

## 2014-07-31 DIAGNOSIS — I10 Essential (primary) hypertension: Secondary | ICD-10-CM

## 2014-07-31 DIAGNOSIS — E785 Hyperlipidemia, unspecified: Secondary | ICD-10-CM | POA: Diagnosis not present

## 2014-07-31 LAB — BASIC METABOLIC PANEL
BUN: 24 mg/dL — ABNORMAL HIGH (ref 6–23)
CHLORIDE: 103 meq/L (ref 96–112)
CO2: 30 mEq/L (ref 19–32)
Calcium: 9.8 mg/dL (ref 8.4–10.5)
Creatinine, Ser: 1.3 mg/dL — ABNORMAL HIGH (ref 0.4–1.2)
GFR: 42.7 mL/min — ABNORMAL LOW (ref >60.00)
Glucose, Bld: 98 mg/dL (ref 70–99)
POTASSIUM: 4.3 meq/L (ref 3.5–5.1)
Sodium: 138 mEq/L (ref 135–145)

## 2014-07-31 LAB — LIPID PANEL
CHOL/HDL RATIO: 3
Cholesterol: 181 mg/dL (ref 0–200)
HDL: 56.7 mg/dL (ref >39.00)
LDL CALC: 107 mg/dL — AB (ref 0–99)
NONHDL: 124.3
TRIGLYCERIDES: 85 mg/dL (ref 0.0–149.0)
VLDL: 17 mg/dL (ref 0.0–40.0)

## 2014-07-31 LAB — HEPATIC FUNCTION PANEL
ALBUMIN: 4.1 g/dL (ref 3.5–5.2)
ALT: 19 U/L (ref 0–35)
AST: 28 U/L (ref 0–37)
Alkaline Phosphatase: 78 U/L (ref 39–117)
Bilirubin, Direct: 0.1 mg/dL (ref 0.0–0.3)
Total Bilirubin: 0.7 mg/dL (ref 0.2–1.2)
Total Protein: 6.7 g/dL (ref 6.0–8.3)

## 2014-07-31 LAB — URINALYSIS
Bilirubin Urine: NEGATIVE
HGB URINE DIPSTICK: NEGATIVE
KETONES UR: NEGATIVE
LEUKOCYTES UA: NEGATIVE
Nitrite: NEGATIVE
Total Protein, Urine: NEGATIVE
URINE GLUCOSE: NEGATIVE
UROBILINOGEN UA: 0.2 (ref 0.0–1.0)
pH: 6 (ref 5.0–8.0)

## 2014-07-31 LAB — TSH: TSH: 2.91 u[IU]/mL (ref 0.35–4.50)

## 2014-08-07 ENCOUNTER — Encounter: Payer: Self-pay | Admitting: Internal Medicine

## 2014-08-07 ENCOUNTER — Telehealth: Payer: Self-pay | Admitting: Endocrinology

## 2014-08-07 ENCOUNTER — Ambulatory Visit (INDEPENDENT_AMBULATORY_CARE_PROVIDER_SITE_OTHER): Payer: Medicare Other | Admitting: Internal Medicine

## 2014-08-07 VITALS — BP 150/64 | HR 68 | Temp 97.5°F

## 2014-08-07 DIAGNOSIS — H1130 Conjunctival hemorrhage, unspecified eye: Secondary | ICD-10-CM | POA: Insufficient documentation

## 2014-08-07 DIAGNOSIS — S81001D Unspecified open wound, right knee, subsequent encounter: Secondary | ICD-10-CM

## 2014-08-07 DIAGNOSIS — S81801D Unspecified open wound, right lower leg, subsequent encounter: Secondary | ICD-10-CM

## 2014-08-07 DIAGNOSIS — I5022 Chronic systolic (congestive) heart failure: Secondary | ICD-10-CM | POA: Diagnosis not present

## 2014-08-07 DIAGNOSIS — N182 Chronic kidney disease, stage 2 (mild): Secondary | ICD-10-CM | POA: Diagnosis not present

## 2014-08-07 DIAGNOSIS — S91001D Unspecified open wound, right ankle, subsequent encounter: Secondary | ICD-10-CM | POA: Diagnosis not present

## 2014-08-07 DIAGNOSIS — H1132 Conjunctival hemorrhage, left eye: Secondary | ICD-10-CM | POA: Diagnosis not present

## 2014-08-07 DIAGNOSIS — I10 Essential (primary) hypertension: Secondary | ICD-10-CM

## 2014-08-07 MED ORDER — MUPIROCIN 2 % EX OINT: TOPICAL_OINTMENT | CUTANEOUS | Status: DC

## 2014-08-07 MED ORDER — FUROSEMIDE 20 MG PO TABS: ORAL_TABLET | ORAL | Status: DC

## 2014-08-07 NOTE — Patient Instructions (Signed)
Subconjunctival Hemorrhage °A subconjunctival hemorrhage is a bright red patch covering a portion of the white of the eye. The white part of the eye is called the sclera, and it is covered by a thin membrane called the conjunctiva. This membrane is clear, except for tiny blood vessels that you can see with the naked eye. When your eye is irritated or inflamed and becomes red, it is because the vessels in the conjunctiva are swollen. °Sometimes, a blood vessel in the conjunctiva can break and bleed. When this occurs, the blood builds up between the conjunctiva and the sclera, and spreads out to create a red area. The red spot may be very small at first. It may then spread to cover a larger part of the surface of the eye, or even all of the visible white part of the eye. °In almost all cases, the blood will go away and the eye will become white again. Before completely dissolving, however, the red area may spread. It may also become brownish-yellow in color before going away. If a lot of blood collects under the conjunctiva, it may look like a bulge on the surface of the eye. This looks scary, but it will also eventually flatten out and go away. Subconjunctival hemorrhages do not cause pain, but if swollen, may cause a feeling of irritation. There is no effect on vision.  °CAUSES  °· The most common cause is mild trauma (rubbing the eye, irritation). °· Subconjunctival hemorrhages can happen because of coughing or straining (lifting heavy objects), vomiting, or sneezing. °· In some cases, your doctor may want to check your blood pressure. High blood pressure can also cause a subconjunctival hemorrhage. °· Severe trauma or blunt injuries. °· Diseases that affect blood clotting (hemophilia, leukemia). °· Abnormalities of blood vessels behind the eye (carotid cavernous sinus fistula). °· Tumors behind the eye. °· Certain drugs (aspirin, Coumadin, heparin). °· Recent eye surgery. °HOME CARE INSTRUCTIONS  °· Do not worry  about the appearance of your eye. You may continue your usual activities. °· Often, follow-up is not necessary. °SEEK MEDICAL CARE IF:  °· Your eye becomes painful. °· The bleeding does not disappear within 3 weeks. °· Bleeding occurs elsewhere, for example, under the skin, in the mouth, or in the other eye. °· You have recurring subconjunctival hemorrhages. °SEEK IMMEDIATE MEDICAL CARE IF:  °· Your vision changes or you have difficulty seeing. °· You develop a severe headache, persistent vomiting, confusion, or abnormal drowsiness (lethargy). °· Your eye seems to bulge or protrude from the eye socket. °· You notice the sudden appearance of bruises or have spontaneous bleeding elsewhere on your body. °Document Released: 08/07/2005 Document Revised: 12/22/2013 Document Reviewed: 07/05/2009 °Exit[employer]® Patient Information ©2015 [employer]. This information is not intended to replace advice given to you by your health care provider. Make sure you discuss any questions you have with your health care provider. ° °

## 2014-08-07 NOTE — Progress Notes (Signed)
Subjective:    HPI  C/o L eye lat hemorrhage after she had 7 hrs of crown work  F/u redness around BJ's recent laceration on R leg - better. C/o nausea w/Doxy  F/u diarrhea came back 2 wks ago 2/d - loose. Prednisone helped in the past - last year. Last FlexSig in 2014 Dr Sharlett Iles  SOB w/exertion - better  F/u wound on L lower leg - injured w/a branch again < 12 week ago  F/u fatigue, not sleeping well  The patient presents for a follow-up of  Chronic CHF, hypertension, chronic dyslipidemia, knee OA - not controlled with medicines. No wt loss. BP is nl at home.    Wt Readings from Last 3 Encounters:  07/30/14 137 lb (62.143 kg)  07/15/14 135 lb (61.236 kg)  07/06/14 136 lb 6.4 oz (61.871 kg)   BP Readings from Last 3 Encounters:  08/07/14 150/64  07/30/14 126/80  07/15/14 130/78      Past Medical History  Diagnosis Date  . GOITER, MULTINODULAR 05/06/2010    Dr [name]  . HYPERTHYROIDISM 05/23/2010  . HYPERLIPIDEMIA 08/20/2007  . ANEMIA-NOS 08/20/2007  . ANXIETY 03/16/2007  . CARPAL TUNNEL SYNDROME, RIGHT 01/11/2008  . HYPERTENSION 03/16/2007  . VENOUS INSUFFICIENCY 09/05/2007  . DIVERTICULOSIS, COLON 08/20/2007  . CELLULITIS, LEG, RIGHT 08/20/2007  . OSTEOARTHRITIS 03/16/2007  . OSTEOPOROSIS 08/20/2007  . SCOLIOSIS 01/11/2008  . BREAST CANCER, HX OF 03/16/2007  . SKIN CANCER, HX OF 11/12/2008     R cheek 2010, L Cheek 2011 Dr. Tonia Brooms  . PREMATURE ATRIAL CONTRACTIONS 09/21/2010  . Thyroid nodule   . Cataract     floaters  . Pneumonia 07/2011    took abx for several weeks  . IBS (irritable bowel syndrome)   . Collagenous colitis    Past Surgical History  Procedure Laterality Date  . Total knee arthroplasty    . Breast lumpectomy Left 2001  . Abdominal hysterectomy    . Appendectomy  1972  . Mastectomy, partial Left   . Carpal tunnel release    . Total knee arthroplasty Right 2010    Dr Para March  . Cataract extraction    . Skin cancer excision     face-Dr. Bing Plume  . Xrt      chemo, surgery  . Eye surgery      cataract ext  . Total knee arthroplasty  10/30/2011    Procedure: TOTAL KNEE ARTHROPLASTY;  Surgeon: Lorn Junes, MD;  Location: [place];  Service: Orthopedics;  Laterality: Left;     . Hernia repair      reports that she quit smoking about 33 years ago. She has never used smokeless tobacco. She reports that she drinks alcohol. She reports that she does not use illicit drugs. family history includes Alcohol abuse in her father; Alzheimer's disease in her mother; Dementia in her mother; Diabetes in her other; Heart attack in her father; Heart disease in her mother; Hypertension in her mother; Mental retardation in her mother; Prostate cancer in her brother. There is no history of Colon cancer or Anesthesia problems. Allergies  Allergen Reactions  . Cefuroxime Diarrhea  . Celecoxib Nausea Only  . Codeine Nausea Only  . Ranitidine     bloating  . Tramadol Hcl Nausea Only   Current Outpatient Prescriptions on File Prior to Visit  Medication Sig Dispense Refill  . ADVAIR DISKUS 250-50 MCG/DOSE AEPB INHALE 1 PUFF INTO THE LUNGS 2 (TWO) TIMES DAILY. 60 each 3  . ALPRAZolam (  XANAX) 0.25 MG tablet Take 1 tablet (0.25 mg total) by mouth 2 (two) times daily. 60 tablet 3  . aspirin 325 MG tablet Take 325 mg by mouth daily.    . Calcium Carbonate-Vitamin D (CALCIUM 600+D) 600-400 MG-UNIT per tablet Take 1 tablet by mouth daily.    . carvedilol (COREG) 12.5 MG tablet TAKE 1 TABLET BY MOUTH TWICE A DAY WITH A MEAL 180 tablet 2  . diphenoxylate-atropine (LOMOTIL) 2.5-0.025 MG per tablet Take 1 tablet by mouth 4 (four) times daily as needed for diarrhea or loose stools. 60 tablet 0  . doxycycline (VIBRA-TABS) 100 MG tablet Take 1 tablet (100 mg total) by mouth 2 (two) times daily. 20 tablet 0  . furosemide (LASIX) 20 MG tablet Take one  tablet every day 90 tablet 3  . losartan (COZAAR) 100 MG tablet Take 1 tablet (100 mg total) by mouth  daily. 90 tablet 3  . Multiple Vitamins-Minerals (MULTIVITAMIN PO) Take 1 each by mouth daily.    . mupirocin ointment (BACTROBAN) 2 % Use qd or bid 22 g 0  . spironolactone (ALDACTONE) 25 MG tablet Take 0.5 tablets (12.5 mg total) by mouth daily. 30 tablet 11   No current facility-administered medications on file prior to visit.   BP 150/64 mmHg  Pulse 68  Temp(Src) 97.5 F (36.4 C) (Oral)  SpO2 95%   Review of Systems  Constitutional: Negative for activity change, appetite change, fatigue and unexpected weight change.  HENT: Positive for congestion. Negative for mouth sores and sinus pressure.   Eyes: Negative for visual disturbance.  Respiratory: Negative for chest tightness.   Gastrointestinal: Negative for nausea.  Genitourinary: Negative for frequency, difficulty urinating and vaginal pain.  Musculoskeletal: Positive for joint swelling (L knee hurts) and gait problem. Negative for back pain.  Skin: Negative for pallor.  Neurological: Negative for dizziness, tremors, weakness and numbness.  Psychiatric/Behavioral: Negative for suicidal ideas, confusion and sleep disturbance. The patient is not nervous/anxious.        Objective:   Physical Exam  Constitutional: She appears well-developed. No distress.  HENT:  Head: Normocephalic.  Right Ear: External ear normal.  Left Ear: External ear normal.  Nose: Nose normal.  Mouth/Throat: Oropharynx is clear and moist.  Eyes: Conjunctivae are normal. Pupils are equal, round, and reactive to light. Right eye exhibits no discharge. Left eye exhibits no discharge.  Neck: Normal range of motion. Neck supple. No JVD present. No tracheal deviation present. No thyromegaly present.  Cardiovascular: Normal rate, regular rhythm and normal heart sounds.   Pulmonary/Chest: No stridor. No respiratory distress. She has no wheezes.  Abdominal: Soft. Bowel sounds are normal. She exhibits no distension and no mass. There is no tenderness. There is  no rebound and no guarding.  Musculoskeletal: She exhibits no edema or tenderness.  Lymphadenopathy:    She has no cervical adenopathy.  Neurological: She displays normal reflexes. No cranial nerve deficit. She exhibits normal muscle tone. Coordination normal.  Skin: No rash noted. No erythema.  Psychiatric: She has a normal mood and affect. Her behavior is normal. Judgment and thought content normal.  L knee w/a post-op scar R lat lower shin w/a  granulating  laceration 1.0x1.0 cm and redness 2x3 cm around it - much better  L eye lat hemorrhage   Lab Results  Component Value Date   WBC 5.3 03/05/2013   HGB 12.5 03/05/2013   HCT 37.2 03/05/2013   PLT 245.0 03/05/2013   GLUCOSE 98 07/31/2014  CHOL 181 07/31/2014   TRIG 85.0 07/31/2014   HDL 56.70 07/31/2014   LDLCALC 107* 07/31/2014   ALT 19 07/31/2014   AST 28 07/31/2014   NA 138 07/31/2014   K 4.3 07/31/2014   CL 103 07/31/2014   CREATININE 1.3* 07/31/2014   BUN 24* 07/31/2014   CO2 30 07/31/2014   TSH 2.91 07/31/2014   INR 0.95 10/25/2011   HGBA1C 5.8 02/06/2014   MICROALBUR 0.6 03/23/2011          Assessment & Plan:

## 2014-08-07 NOTE — Assessment & Plan Note (Signed)
R lower leg ulcer is healing Cont Rx

## 2014-08-07 NOTE — Telephone Encounter (Signed)
Patient is calling for the results of her lab work °

## 2014-08-07 NOTE — Telephone Encounter (Signed)
Pt advised that we are still waiting on the lab results. Pt will receive a phone call when we get labs back.

## 2014-08-07 NOTE — Assessment & Plan Note (Signed)
Continue with current prescription therapy as reflected on the Med list.  

## 2014-08-07 NOTE — Progress Notes (Signed)
Pre visit review using our clinic review tool, if applicable. No additional management support is needed unless otherwise documented below in the visit note. 

## 2014-08-07 NOTE — Assessment & Plan Note (Signed)
L eye lat hemorrhage after she had 7 hrs of crown work She will see her eye dr today

## 2014-09-02 ENCOUNTER — Other Ambulatory Visit: Payer: Self-pay | Admitting: Dermatology

## 2014-09-02 DIAGNOSIS — D0439 Carcinoma in situ of skin of other parts of face: Secondary | ICD-10-CM | POA: Diagnosis not present

## 2014-09-02 DIAGNOSIS — D485 Neoplasm of uncertain behavior of skin: Secondary | ICD-10-CM | POA: Diagnosis not present

## 2014-09-02 DIAGNOSIS — D044 Carcinoma in situ of skin of scalp and neck: Secondary | ICD-10-CM | POA: Diagnosis not present

## 2014-10-10 ENCOUNTER — Other Ambulatory Visit: Payer: Self-pay | Admitting: Internal Medicine

## 2014-10-12 ENCOUNTER — Telehealth: Payer: Self-pay | Admitting: Internal Medicine

## 2014-10-12 NOTE — Telephone Encounter (Signed)
Patient requesting refill of   ALPRAZolam (XANAX) 0.25 MG tablet [id number]

## 2014-10-12 NOTE — Telephone Encounter (Signed)
Patient called back requesting refill on alprazolam.  Needs sent to [facility] on [address].

## 2014-10-13 NOTE — Telephone Encounter (Signed)
OK to fill this prescription with additional refills x3 Thank you!  

## 2014-10-14 MED ORDER — ALPRAZOLAM 0.25 MG PO TABS: 0.2500 mg | ORAL_TABLET | Freq: Two times a day (BID) | ORAL | Status: DC

## 2014-10-14 NOTE — Telephone Encounter (Signed)
Called refill into [facility] spoke with Bill/pharmacist gave md approval. Notified pt refill has been call to her pharmacy...Mia Contreras

## 2014-11-04 ENCOUNTER — Other Ambulatory Visit (INDEPENDENT_AMBULATORY_CARE_PROVIDER_SITE_OTHER): Payer: Medicare Other

## 2014-11-04 ENCOUNTER — Other Ambulatory Visit: Payer: Self-pay | Admitting: Physician Assistant

## 2014-11-04 DIAGNOSIS — I5022 Chronic systolic (congestive) heart failure: Secondary | ICD-10-CM | POA: Diagnosis not present

## 2014-11-04 LAB — BASIC METABOLIC PANEL
BUN: 15 mg/dL (ref 6–23)
CO2: 32 meq/L (ref 19–32)
Calcium: 9.6 mg/dL (ref 8.4–10.5)
Chloride: 104 mEq/L (ref 96–112)
Creatinine, Ser: 1.08 mg/dL (ref 0.40–1.20)
GFR: 51.45 mL/min — AB (ref >60.00)
GLUCOSE: 93 mg/dL (ref 70–99)
POTASSIUM: 4.1 meq/L (ref 3.5–5.1)
SODIUM: 142 meq/L (ref 135–145)

## 2014-11-05 DIAGNOSIS — Z96653 Presence of artificial knee joint, bilateral: Secondary | ICD-10-CM | POA: Diagnosis not present

## 2014-11-11 ENCOUNTER — Encounter: Payer: Self-pay | Admitting: Internal Medicine

## 2014-11-11 ENCOUNTER — Ambulatory Visit (INDEPENDENT_AMBULATORY_CARE_PROVIDER_SITE_OTHER): Payer: Medicare Other | Admitting: Internal Medicine

## 2014-11-11 VITALS — BP 140/78 | HR 70 | Wt 138.0 lb

## 2014-11-11 DIAGNOSIS — I10 Essential (primary) hypertension: Secondary | ICD-10-CM | POA: Diagnosis not present

## 2014-11-11 DIAGNOSIS — F411 Generalized anxiety disorder: Secondary | ICD-10-CM

## 2014-11-11 DIAGNOSIS — H6121 Impacted cerumen, right ear: Secondary | ICD-10-CM

## 2014-11-11 MED ORDER — ALPRAZOLAM 0.25 MG PO TABS: 0.2500 mg | ORAL_TABLET | Freq: Two times a day (BID) | ORAL | Status: DC

## 2014-11-11 NOTE — Progress Notes (Signed)
Subjective:    HPI    F/u redness around BJ's recent laceration on R leg - better. C/o nausea w/Doxy  F/u diarrhea came back 2 wks ago 2/d - loose. Prednisone helped in the past - last year. Last FlexSig in 2014 Dr Sharlett Iles  SOB w/exertion - better  F/u wound on L lower leg - injured w/a branch again < 12 week ago  F/u fatigue - better  The patient presents for a follow-up of  Chronic CHF, hypertension, chronic dyslipidemia, knee OA - not controlled with medicines. No wt loss. BP is nl at home.    Wt Readings from Last 3 Encounters:  11/11/14 138 lb (62.596 kg)  07/30/14 137 lb (62.143 kg)  07/15/14 135 lb (61.236 kg)   BP Readings from Last 3 Encounters:  11/11/14 140/78  08/07/14 150/64  07/30/14 126/80      Past Medical History  Diagnosis Date  . GOITER, MULTINODULAR 05/06/2010    Dr [name]  . HYPERTHYROIDISM 05/23/2010  . HYPERLIPIDEMIA 08/20/2007  . ANEMIA-NOS 08/20/2007  . ANXIETY 03/16/2007  . CARPAL TUNNEL SYNDROME, RIGHT 01/11/2008  . HYPERTENSION 03/16/2007  . VENOUS INSUFFICIENCY 09/05/2007  . DIVERTICULOSIS, COLON 08/20/2007  . CELLULITIS, LEG, RIGHT 08/20/2007  . OSTEOARTHRITIS 03/16/2007  . OSTEOPOROSIS 08/20/2007  . SCOLIOSIS 01/11/2008  . BREAST CANCER, HX OF 03/16/2007  . SKIN CANCER, HX OF 11/12/2008     R cheek 2010, L Cheek 2011 Dr. Tonia Brooms  . PREMATURE ATRIAL CONTRACTIONS 09/21/2010  . Thyroid nodule   . Cataract     floaters  . Pneumonia 07/2011    took abx for several weeks  . IBS (irritable bowel syndrome)   . Collagenous colitis    Past Surgical History  Procedure Laterality Date  . Total knee arthroplasty    . Breast lumpectomy Left 2001  . Abdominal hysterectomy    . Appendectomy  1972  . Mastectomy, partial Left   . Carpal tunnel release    . Total knee arthroplasty Right 2010    Dr Para March  . Cataract extraction    . Skin cancer excision      face-Dr. Bing Plume  . Xrt      chemo, surgery  . Eye surgery      cataract ext   . Total knee arthroplasty  10/30/2011    Procedure: TOTAL KNEE ARTHROPLASTY;  Surgeon: Lorn Junes, MD;  Location: [place] [facility];  Service: Orthopedics;  Laterality: Left;     . Hernia repair      reports that she quit smoking about 33 years ago. She has never used smokeless tobacco. She reports that she drinks alcohol. She reports that she does not use illicit drugs. family history includes Alcohol abuse in her father; Alzheimer's disease in her mother; Dementia in her mother; Diabetes in her other; Heart attack in her father; Heart disease in her mother; Hypertension in her mother; Mental retardation in her mother; Prostate cancer in her brother. There is no history of Colon cancer or Anesthesia problems. Allergies  Allergen Reactions  . Cefuroxime Diarrhea  . Celecoxib Nausea Only  . Codeine Nausea Only  . Ranitidine     bloating  . Tramadol Hcl Nausea Only   Current Outpatient Prescriptions on File Prior to Visit  Medication Sig Dispense Refill  . ADVAIR DISKUS 250-50 MCG/DOSE AEPB INHALE 1 PUFF INTO THE LUNGS 2 (TWO) TIMES DAILY. 60 each 3  . ALPRAZolam (XANAX) 0.25 MG tablet Take 1 tablet (0.25 mg total) by mouth 2 (  two) times daily. 60 tablet 3  . aspirin 325 MG tablet Take 325 mg by mouth daily.    . Calcium Carbonate-Vitamin D (CALCIUM 600+D) 600-400 MG-UNIT per tablet Take 1 tablet by mouth daily.    . carvedilol (COREG) 12.5 MG tablet TAKE 1 TABLET BY MOUTH TWICE A DAY WITH A MEAL 180 tablet 2  . diphenoxylate-atropine (LOMOTIL) 2.5-0.025 MG per tablet Take 1 tablet by mouth 4 (four) times daily as needed for diarrhea or loose stools. 60 tablet 0  . furosemide (LASIX) 20 MG tablet Take one  tablet every day 90 tablet 3  . losartan (COZAAR) 100 MG tablet Take 1 tablet (100 mg total) by mouth daily. 90 tablet 3  . Multiple Vitamins-Minerals (MULTIVITAMIN PO) Take 1 each by mouth daily.    . mupirocin ointment (BACTROBAN) 2 % Use qd or bid 22 g 0  . spironolactone (ALDACTONE) 25 MG  tablet TAKE 1/2 TABLET BY MOUTH EVERY DAY 15 tablet 1   No current facility-administered medications on file prior to visit.   BP 140/78 mmHg  Pulse 70  Wt 138 lb (62.596 kg)  SpO2 95%   Review of Systems  Constitutional: Negative for activity change, appetite change, fatigue and unexpected weight change.  HENT: Positive for congestion. Negative for mouth sores and sinus pressure.   Eyes: Negative for visual disturbance.  Respiratory: Negative for chest tightness.   Gastrointestinal: Negative for nausea.  Genitourinary: Negative for frequency, difficulty urinating and vaginal pain.  Musculoskeletal: Positive for joint swelling (L knee hurts) and gait problem. Negative for back pain.  Skin: Negative for pallor.  Neurological: Negative for dizziness, tremors, weakness and numbness.  Psychiatric/Behavioral: Negative for suicidal ideas, confusion and sleep disturbance. The patient is not nervous/anxious.        Objective:   Physical Exam  Constitutional: She appears well-developed. No distress.  HENT:  Head: Normocephalic.  Right Ear: External ear normal.  Left Ear: External ear normal.  Nose: Nose normal.  Mouth/Throat: Oropharynx is clear and moist.  Eyes: Conjunctivae are normal. Pupils are equal, round, and reactive to light. Right eye exhibits no discharge. Left eye exhibits no discharge.  Neck: Normal range of motion. Neck supple. No JVD present. No tracheal deviation present. No thyromegaly present.  Cardiovascular: Normal rate, regular rhythm and normal heart sounds.   Pulmonary/Chest: No stridor. No respiratory distress. She has no wheezes.  Abdominal: Soft. Bowel sounds are normal. She exhibits no distension and no mass. There is no tenderness. There is no rebound and no guarding.  Musculoskeletal: She exhibits no edema or tenderness.  Lymphadenopathy:    She has no cervical adenopathy.  Neurological: She displays normal reflexes. No cranial nerve deficit. She exhibits  normal muscle tone. Coordination normal.  Skin: No rash noted. No erythema.  Psychiatric: She has a normal mood and affect. Her behavior is normal. Judgment and thought content normal.  L knee w/a post-op scar R ear w/wax  Lab Results  Component Value Date   WBC 5.3 03/05/2013   HGB 12.5 03/05/2013   HCT 37.2 03/05/2013   PLT 245.0 03/05/2013   GLUCOSE 93 11/04/2014   CHOL 181 07/31/2014   TRIG 85.0 07/31/2014   HDL 56.70 07/31/2014   LDLCALC 107* 07/31/2014   ALT 19 07/31/2014   AST 28 07/31/2014   NA 142 11/04/2014   K 4.1 11/04/2014   CL 104 11/04/2014   CREATININE 1.08 11/04/2014   BUN 15 11/04/2014   CO2 32 11/04/2014   TSH  2.91 07/31/2014   INR 0.95 10/25/2011   HGBA1C 5.8 02/06/2014   MICROALBUR 0.6 03/23/2011      Procedure Note :     Procedure :  Ear irrigation R   Indication:  Cerumen impaction   Risks, including pain, dizziness, eardrum perforation, bleeding, infection and others as well as benefits were explained to the patient in detail. Verbal consent was obtained and the patient agreed to proceed.    We used "The Elephant Ear Irrigation Device" filled with lukewarm water for irrigation. A large amount wax was recovered. Procedure has also required manual wax removal with an ear loop.   Tolerated well. Complications: None.   Postprocedure instructions :  Call if problems.       Assessment & Plan:

## 2014-11-11 NOTE — Progress Notes (Signed)
Pre visit review using our clinic review tool, if applicable. No additional management support is needed unless otherwise documented below in the visit note. 

## 2014-11-11 NOTE — Assessment & Plan Note (Signed)
BP Readings from Last 3 Encounters:  11/11/14 140/78  08/07/14 150/64  07/30/14 126/80  Losartan, Coreg,Spironolactone

## 2014-11-11 NOTE — Assessment & Plan Note (Signed)
Potential benefits of a long term benzodiazepines  use as well as potential risks  and complications were explained to the patient and were aknowledged. Alprazolam prn  

## 2014-11-11 NOTE — Assessment & Plan Note (Signed)
Losartan, Coreg,Spironolactone

## 2014-12-07 NOTE — Progress Notes (Signed)
Patient ID: Mia Contreras, female   DOB: [birth year], 81 y.o.   MRN: [id number] Mia Contreras is a 81 y.o.  female with a history of nonischemic cardiomyopathy, systolic CHF, PACs, breast cancer. She was recently seen by me  and noted to have increased dyspnea. BNP was elevaetd and she was placed on Lasix. I saw her in f/u 08/22/13. She continued to noted DOE. She admitted to being out of her Advair for ~ 2 mos. I refilled this. Her BNP remained elevated on Lasix and aldactone added  Lexiscan Myoview (04/2011): No ischemia, EF 32%.   Echocardiogram (12/2012): EF 20%, diffuse HK, diastolic dysfunction, aortic sclerosis without stenosis, mild MR, mild to moderate LAE, mildly reduced RVSF, PASP 38.  She was recently placed on a Z-Pak for sinusitis. She continues to note NYHA class 2-2b dyspnea. There has been no improvement with resuming Advair. She denies chest pain, syncope, orthopnea, PND or edema.  Lots of questions about risk of MI and signs of worsening CHF  Weighs herself at home biweekly 135 lbs  Stable no edema   Needs updated echo No CHF symptoms  Has been to wound center before for slowly healing leg wounds.    ROS: Denies fever, malais, weight loss, blurry vision, decreased visual acuity, cough, sputum, SOB, hemoptysis, pleuritic pain, palpitaitons, heartburn, abdominal pain, melena, lower extremity edema, claudication, or rash.  All other systems reviewed and negative  General: Affect appropriate Healthy:  appears stated age 81: normal Neck supple with no adenopathy JVP normal no bruits no thyromegaly Lungs clear with no wheezing and good diaphragmatic motion Heart:  S1/S2 SEM  murmur, no rub, gallop or click PMI normal Abdomen: benighn, BS positve, no tenderness, no AAA no bruit.  No HSM or HJR Distal pulses intact with no bruits Chronic venous disease bilaterally with plus one LLE edema  Neuro non-focal Skin warm and dry No muscular weakness   Current Outpatient  Prescriptions  Medication Sig Dispense Refill  . ADVAIR DISKUS 250-50 MCG/DOSE AEPB INHALE 1 PUFF INTO THE LUNGS 2 (TWO) TIMES DAILY. 60 each 3  . ALPRAZolam (XANAX) 0.25 MG tablet Take 1 tablet (0.25 mg total) by mouth 2 (two) times daily. 60 tablet 5  . aspirin 325 MG tablet Take 325 mg by mouth daily.    . Calcium Carbonate-Vitamin D (CALCIUM 600+D) 600-400 MG-UNIT per tablet Take 1 tablet by mouth daily.    . carvedilol (COREG) 12.5 MG tablet TAKE 1 TABLET BY MOUTH TWICE A DAY WITH A MEAL 180 tablet 2  . diphenoxylate-atropine (LOMOTIL) 2.5-0.025 MG per tablet Take 1 tablet by mouth 4 (four) times daily as needed for diarrhea or loose stools. 60 tablet 0  . furosemide (LASIX) 20 MG tablet Take one  tablet every day 90 tablet 3  . losartan (COZAAR) 100 MG tablet Take 1 tablet (100 mg total) by mouth daily. 90 tablet 3  . Multiple Vitamins-Minerals (MULTIVITAMIN PO) Take 1 each by mouth daily.    . mupirocin ointment (BACTROBAN) 2 % Use qd or bid 22 g 0  . spironolactone (ALDACTONE) 25 MG tablet TAKE 1/2 TABLET BY MOUTH EVERY DAY 15 tablet 1   No current facility-administered medications for this visit.    Allergies  Cefuroxime; Celecoxib; Codeine; Ranitidine; and Tramadol hcl  Electrocardiogram:   06/10/14  SR rate 84 LAD old IMI  Long PR  PAC   12/09/14  SR rate 63  LAD poor R wave progression   Assessment and Plan CHF  Continue current meds update echo for EF  BMET in March was fine   Venous Disease:  F/u plotnicov  Cut has healed  Continue diuretics to minimize edema COPD:  Continue advair no active wheezing claritin and flonase for allergies  Echo ordered F/U with me 6 months

## 2014-12-09 ENCOUNTER — Encounter: Payer: Self-pay | Admitting: Cardiovascular Disease

## 2014-12-09 ENCOUNTER — Ambulatory Visit (INDEPENDENT_AMBULATORY_CARE_PROVIDER_SITE_OTHER): Payer: Medicare Other | Admitting: Cardiovascular Disease

## 2014-12-09 VITALS — BP 160/84 | HR 63 | Ht 63.0 in | Wt 139.4 lb

## 2014-12-09 DIAGNOSIS — I5022 Chronic systolic (congestive) heart failure: Secondary | ICD-10-CM | POA: Diagnosis not present

## 2014-12-09 DIAGNOSIS — I499 Cardiac arrhythmia, unspecified: Secondary | ICD-10-CM | POA: Diagnosis not present

## 2014-12-09 MED ORDER — SPIRONOLACTONE 25 MG PO TABS: 12.5000 mg | ORAL_TABLET | Freq: Every day | ORAL | Status: DC

## 2014-12-09 NOTE — Patient Instructions (Addendum)
Medication Instructions:  NO CHANGES  Labwork: NONE  Testing/Procedures: Your physician has requested that you have an echocardiogram. Echocardiography is a painless test that uses sound waves to create images of your heart. It provides your doctor with information about the size and shape of your heart and how well your heart's chambers and valves are working. This procedure takes approximately one hour. There are no restrictions for this procedure.    Follow-Up: 6 MONTHS WITH  DR Johnsie Cancel  Any Other Special Instructions Will Be Listed Below (If Applicable).

## 2014-12-16 ENCOUNTER — Ambulatory Visit (HOSPITAL_COMMUNITY): Payer: Medicare Other | Attending: Cardiovascular Disease | Admitting: Cardiology

## 2014-12-16 DIAGNOSIS — I5022 Chronic systolic (congestive) heart failure: Secondary | ICD-10-CM

## 2014-12-16 NOTE — Progress Notes (Signed)
Echo performed. 

## 2014-12-31 DIAGNOSIS — L720 Epidermal cyst: Secondary | ICD-10-CM | POA: Diagnosis not present

## 2014-12-31 DIAGNOSIS — Z85828 Personal history of other malignant neoplasm of skin: Secondary | ICD-10-CM | POA: Diagnosis not present

## 2014-12-31 DIAGNOSIS — L570 Actinic keratosis: Secondary | ICD-10-CM | POA: Diagnosis not present

## 2014-12-31 DIAGNOSIS — L821 Other seborrheic keratosis: Secondary | ICD-10-CM | POA: Diagnosis not present

## 2014-12-31 DIAGNOSIS — Z411 Encounter for cosmetic surgery: Secondary | ICD-10-CM | POA: Diagnosis not present

## 2015-01-22 ENCOUNTER — Other Ambulatory Visit: Payer: Self-pay | Admitting: Internal Medicine

## 2015-02-10 ENCOUNTER — Ambulatory Visit (INDEPENDENT_AMBULATORY_CARE_PROVIDER_SITE_OTHER)
Admission: RE | Admit: 2015-02-10 | Discharge: 2015-02-10 | Disposition: A | Discharge status: Home / Self Care | Payer: Medicare Other | Source: Ambulatory Visit | Attending: Internal Medicine | Admitting: Internal Medicine

## 2015-02-10 ENCOUNTER — Ambulatory Visit (INDEPENDENT_AMBULATORY_CARE_PROVIDER_SITE_OTHER): Payer: Medicare Other | Admitting: Internal Medicine

## 2015-02-10 ENCOUNTER — Encounter: Payer: Self-pay | Admitting: Internal Medicine

## 2015-02-10 VITALS — BP 160/78 | HR 74 | Wt 137.0 lb

## 2015-02-10 DIAGNOSIS — I5022 Chronic systolic (congestive) heart failure: Secondary | ICD-10-CM

## 2015-02-10 DIAGNOSIS — J42 Unspecified chronic bronchitis: Secondary | ICD-10-CM | POA: Diagnosis not present

## 2015-02-10 DIAGNOSIS — J4520 Mild intermittent asthma, uncomplicated: Secondary | ICD-10-CM | POA: Diagnosis not present

## 2015-02-10 DIAGNOSIS — R05 Cough: Secondary | ICD-10-CM | POA: Diagnosis not present

## 2015-02-10 DIAGNOSIS — I10 Essential (primary) hypertension: Secondary | ICD-10-CM

## 2015-02-10 MED ORDER — ALPRAZOLAM 0.5 MG PO TABS
0.5000 mg | ORAL_TABLET | Freq: Two times a day (BID) | ORAL | Status: DC | PRN
Start: 2015-02-10 — End: 2015-08-11

## 2015-02-10 MED ORDER — FUROSEMIDE 20 MG PO TABS: ORAL_TABLET | ORAL | Status: DC

## 2015-02-10 NOTE — Assessment & Plan Note (Signed)
CXR

## 2015-02-10 NOTE — Patient Instructions (Addendum)
Use Claritin daily Call if not better

## 2015-02-10 NOTE — Assessment & Plan Note (Signed)
2016 EF 30-35% Coreg, Furosemide, Losartan  CXR

## 2015-02-10 NOTE — Progress Notes (Signed)
Subjective:    HPI    F/u redness around BJ's recent laceration on R leg - better. C/o nausea w/Doxy  F/u diarrhea came back 2 wks ago 2/d - loose. Prednisone helped in the past - last year. Last FlexSig in 2014 Dr Sharlett Iles  C/o SOB w/exertion, wheezing x 2-3 wks. C/o insomnia.  F/u wound on L lower leg - injured w/a branch again < 12 week ago  F/u fatigue - worse  The patient presents for a follow-up of  Chronic CHF, hypertension, chronic dyslipidemia, knee OA - not controlled with medicines. No wt loss. BP is nl at home.    Wt Readings from Last 3 Encounters:  02/10/15 137 lb (62.143 kg)  12/09/14 139 lb 6.4 oz (63.231 kg)  11/11/14 138 lb (62.596 kg)   BP Readings from Last 3 Encounters:  02/10/15 160/78  12/09/14 160/84  11/11/14 140/78      Past Medical History  Diagnosis Date  . GOITER, MULTINODULAR 05/06/2010    Dr [name]  . HYPERTHYROIDISM 05/23/2010  . HYPERLIPIDEMIA 08/20/2007  . ANEMIA-NOS 08/20/2007  . ANXIETY 03/16/2007  . CARPAL TUNNEL SYNDROME, RIGHT 01/11/2008  . HYPERTENSION 03/16/2007  . VENOUS INSUFFICIENCY 09/05/2007  . DIVERTICULOSIS, COLON 08/20/2007  . CELLULITIS, LEG, RIGHT 08/20/2007  . OSTEOARTHRITIS 03/16/2007  . OSTEOPOROSIS 08/20/2007  . SCOLIOSIS 01/11/2008  . BREAST CANCER, HX OF 03/16/2007  . SKIN CANCER, HX OF 11/12/2008     R cheek 2010, L Cheek 2011 Dr. Tonia Brooms  . PREMATURE ATRIAL CONTRACTIONS 09/21/2010  . Thyroid nodule   . Cataract     floaters  . Pneumonia 07/2011    took abx for several weeks  . IBS (irritable bowel syndrome)   . Collagenous colitis    Past Surgical History  Procedure Laterality Date  . Total knee arthroplasty    . Breast lumpectomy Left 2001  . Abdominal hysterectomy    . Appendectomy  1972  . Mastectomy, partial Left   . Carpal tunnel release    . Total knee arthroplasty Right 2010    Dr Para March  . Cataract extraction    . Skin cancer excision      face-Dr. Bing Plume  . Xrt      chemo, surgery   . Eye surgery      cataract ext  . Total knee arthroplasty  10/30/2011    Procedure: TOTAL KNEE ARTHROPLASTY;  Surgeon: Lorn Junes, MD;  Location: [place];  Service: Orthopedics;  Laterality: Left;     . Hernia repair      reports that she quit smoking about 34 years ago. She has never used smokeless tobacco. She reports that she drinks alcohol. She reports that she does not use illicit drugs. family history includes Alcohol abuse in her father; Alzheimer's disease in her mother; Dementia in her mother; Diabetes in her other; Heart attack in her father; Heart disease in her mother; Hypertension in her mother; Mental retardation in her mother; Prostate cancer in her brother. There is no history of Colon cancer or Anesthesia problems. Allergies  Allergen Reactions  . Cefuroxime Diarrhea  . Celecoxib Nausea Only  . Codeine Nausea Only  . Ranitidine     bloating  . Tramadol Hcl Nausea Only   Current Outpatient Prescriptions on File Prior to Visit  Medication Sig Dispense Refill  . ADVAIR DISKUS 250-50 MCG/DOSE AEPB INHALE 1 PUFF INTO THE LUNGS 2 (TWO) TIMES DAILY. 60 each 3  . ALPRAZolam (XANAX) 0.25 MG tablet Take 1  tablet (0.25 mg total) by mouth 2 (two) times daily. 60 tablet 5  . aspirin 325 MG tablet Take 325 mg by mouth daily.    . Calcium Carbonate-Vitamin D (CALCIUM 600+D) 600-400 MG-UNIT per tablet Take 1 tablet by mouth daily.    . carvedilol (COREG) 12.5 MG tablet TAKE 1 TABLET BY MOUTH TWICE A DAY WITH A MEAL 180 tablet 3  . diphenoxylate-atropine (LOMOTIL) 2.5-0.025 MG per tablet Take 1 tablet by mouth 4 (four) times daily as needed for diarrhea or loose stools. 60 tablet 0  . furosemide (LASIX) 20 MG tablet Take one  tablet every day 90 tablet 3  . losartan (COZAAR) 100 MG tablet Take 1 tablet (100 mg total) by mouth daily. 90 tablet 3  . Multiple Vitamins-Minerals (MULTIVITAMIN PO) Take 1 each by mouth daily.    Marland Kitchen spironolactone (ALDACTONE) 25 MG tablet Take 0.5 tablets  (12.5 mg total) by mouth daily. 15 tablet 11   No current facility-administered medications on file prior to visit.   BP 160/78 mmHg  Pulse 74  Wt 137 lb (62.143 kg)  SpO2 95%   Review of Systems  Constitutional: Negative for activity change, appetite change, fatigue and unexpected weight change.  HENT: Positive for congestion. Negative for mouth sores and sinus pressure.   Eyes: Negative for visual disturbance.  Respiratory: Negative for chest tightness.   Gastrointestinal: Negative for nausea.  Genitourinary: Negative for frequency, difficulty urinating and vaginal pain.  Musculoskeletal: Positive for joint swelling (L knee hurts) and gait problem. Negative for back pain.  Skin: Negative for pallor.  Neurological: Negative for dizziness, tremors, weakness and numbness.  Psychiatric/Behavioral: Negative for suicidal ideas, confusion and sleep disturbance. The patient is not nervous/anxious.        Objective:   Physical Exam  Constitutional: She appears well-developed. No distress.  HENT:  Head: Normocephalic.  Right Ear: External ear normal.  Left Ear: External ear normal.  Nose: Nose normal.  Mouth/Throat: Oropharynx is clear and moist.  Eyes: Conjunctivae are normal. Pupils are equal, round, and reactive to light. Right eye exhibits no discharge. Left eye exhibits no discharge.  Neck: Normal range of motion. Neck supple. No JVD present. No tracheal deviation present. No thyromegaly present.  Cardiovascular: Normal rate, regular rhythm and normal heart sounds.   Pulmonary/Chest: No stridor. No respiratory distress. She has no wheezes.  Abdominal: Soft. Bowel sounds are normal. She exhibits no distension and no mass. There is no tenderness. There is no rebound and no guarding.  Musculoskeletal: She exhibits no edema or tenderness.  Lymphadenopathy:    She has no cervical adenopathy.  Neurological: She displays normal reflexes. No cranial nerve deficit. She exhibits normal  muscle tone. Coordination normal.  Skin: No rash noted. No erythema.  Psychiatric: She has a normal mood and affect. Her behavior is normal. Judgment and thought content normal.  L knee w/a post-op scar R ear w/wax L goiter No edema  Lab Results  Component Value Date   WBC 5.3 03/05/2013   HGB 12.5 03/05/2013   HCT 37.2 03/05/2013   PLT 245.0 03/05/2013   GLUCOSE 93 11/04/2014   CHOL 181 07/31/2014   TRIG 85.0 07/31/2014   HDL 56.70 07/31/2014   LDLCALC 107* 07/31/2014   ALT 19 07/31/2014   AST 28 07/31/2014   NA 142 11/04/2014   K 4.1 11/04/2014   CL 104 11/04/2014   CREATININE 1.08 11/04/2014   BUN 15 11/04/2014   CO2 32 11/04/2014   TSH 2.91  07/31/2014   INR 0.95 10/25/2011   HGBA1C 5.8 02/06/2014   MICROALBUR 0.6 03/23/2011        Assessment & Plan:

## 2015-02-10 NOTE — Assessment & Plan Note (Signed)
Chronic and controlled BP at home has been WNL Losartan, Coreg, Spironolactone

## 2015-02-10 NOTE — Progress Notes (Signed)
Pre visit review using our clinic review tool, if applicable. No additional management support is needed unless otherwise documented below in the visit note. 

## 2015-02-10 NOTE — Assessment & Plan Note (Signed)
Advair 

## 2015-03-18 DIAGNOSIS — Z1231 Encounter for screening mammogram for malignant neoplasm of breast: Secondary | ICD-10-CM | POA: Diagnosis not present

## 2015-03-18 DIAGNOSIS — Z853 Personal history of malignant neoplasm of breast: Secondary | ICD-10-CM | POA: Diagnosis not present

## 2015-03-18 LAB — HM MAMMOGRAPHY

## 2015-03-19 ENCOUNTER — Encounter: Payer: Self-pay | Admitting: Internal Medicine

## 2015-05-06 ENCOUNTER — Other Ambulatory Visit (INDEPENDENT_AMBULATORY_CARE_PROVIDER_SITE_OTHER): Payer: Medicare Other

## 2015-05-06 DIAGNOSIS — J42 Unspecified chronic bronchitis: Secondary | ICD-10-CM | POA: Diagnosis not present

## 2015-05-06 DIAGNOSIS — J4520 Mild intermittent asthma, uncomplicated: Secondary | ICD-10-CM | POA: Diagnosis not present

## 2015-05-06 DIAGNOSIS — I10 Essential (primary) hypertension: Secondary | ICD-10-CM

## 2015-05-06 DIAGNOSIS — I5022 Chronic systolic (congestive) heart failure: Secondary | ICD-10-CM

## 2015-05-06 LAB — CBC WITH DIFFERENTIAL/PLATELET
Basophils Absolute: 0 10*3/uL (ref 0.0–0.1)
Basophils Relative: 0.4 % (ref 0.0–3.0)
EOS PCT: 3.3 % (ref 0.0–5.0)
Eosinophils Absolute: 0.2 10*3/uL (ref 0.0–0.7)
HCT: 38.4 % (ref 36.0–46.0)
HEMOGLOBIN: 12.8 g/dL (ref 12.0–15.0)
LYMPHS ABS: 1.6 10*3/uL (ref 0.7–4.0)
Lymphocytes Relative: 25.6 % (ref 12.0–46.0)
MCHC: 33.4 g/dL (ref 30.0–36.0)
MCV: 90.8 fl (ref 78.0–100.0)
MONOS PCT: 10.6 % (ref 3.0–12.0)
Monocytes Absolute: 0.7 10*3/uL (ref 0.1–1.0)
NEUTROS PCT: 60.1 % (ref 43.0–77.0)
Neutro Abs: 3.8 10*3/uL (ref 1.4–7.7)
Platelets: 239 10*3/uL (ref 150.0–400.0)
RBC: 4.23 Mil/uL (ref 3.87–5.11)
RDW: 15.3 % (ref 11.5–15.5)
WBC: 6.4 10*3/uL (ref 4.0–10.5)

## 2015-05-06 LAB — URINALYSIS, ROUTINE W REFLEX MICROSCOPIC
Bilirubin Urine: NEGATIVE
Hgb urine dipstick: NEGATIVE
KETONES UR: NEGATIVE
NITRITE: NEGATIVE
Specific Gravity, Urine: 1.01 (ref 1.000–1.030)
Total Protein, Urine: NEGATIVE
Urine Glucose: NEGATIVE
Urobilinogen, UA: 0.2 (ref 0.0–1.0)
pH: 6.5 (ref 5.0–8.0)

## 2015-05-06 LAB — LIPID PANEL
Cholesterol: 165 mg/dL (ref 0–200)
HDL: 54.6 mg/dL (ref >39.00)
LDL Cholesterol: 93 mg/dL (ref 0–99)
NonHDL: 110.14
TRIGLYCERIDES: 88 mg/dL (ref 0.0–149.0)
Total CHOL/HDL Ratio: 3
VLDL: 17.6 mg/dL (ref 0.0–40.0)

## 2015-05-06 LAB — HEPATIC FUNCTION PANEL
ALBUMIN: 4.1 g/dL (ref 3.5–5.2)
ALT: 18 U/L (ref 0–35)
AST: 26 U/L (ref 0–37)
Alkaline Phosphatase: 76 U/L (ref 39–117)
Bilirubin, Direct: 0.1 mg/dL (ref 0.0–0.3)
TOTAL PROTEIN: 6.8 g/dL (ref 6.0–8.3)
Total Bilirubin: 0.8 mg/dL (ref 0.2–1.2)

## 2015-05-06 LAB — BASIC METABOLIC PANEL
BUN: 21 mg/dL (ref 6–23)
CALCIUM: 9.7 mg/dL (ref 8.4–10.5)
CO2: 30 meq/L (ref 19–32)
Chloride: 103 mEq/L (ref 96–112)
Creatinine, Ser: 1.11 mg/dL (ref 0.40–1.20)
GFR: 49.79 mL/min — ABNORMAL LOW (ref >60.00)
GLUCOSE: 92 mg/dL (ref 70–99)
Potassium: 4.7 mEq/L (ref 3.5–5.1)
Sodium: 142 mEq/L (ref 135–145)

## 2015-05-06 LAB — TSH: TSH: 2.26 u[IU]/mL (ref 0.35–4.50)

## 2015-05-12 ENCOUNTER — Ambulatory Visit (INDEPENDENT_AMBULATORY_CARE_PROVIDER_SITE_OTHER): Payer: Medicare Other | Admitting: Internal Medicine

## 2015-05-12 ENCOUNTER — Encounter: Payer: Self-pay | Admitting: Internal Medicine

## 2015-05-12 VITALS — BP 150/80 | HR 57 | Wt 138.0 lb

## 2015-05-12 DIAGNOSIS — J449 Chronic obstructive pulmonary disease, unspecified: Secondary | ICD-10-CM

## 2015-05-12 DIAGNOSIS — I5022 Chronic systolic (congestive) heart failure: Secondary | ICD-10-CM | POA: Diagnosis not present

## 2015-05-12 DIAGNOSIS — Z23 Encounter for immunization: Secondary | ICD-10-CM

## 2015-05-12 DIAGNOSIS — K449 Diaphragmatic hernia without obstruction or gangrene: Secondary | ICD-10-CM | POA: Diagnosis not present

## 2015-05-12 MED ORDER — RANITIDINE HCL 150 MG PO TABS: 150.0000 mg | ORAL_TABLET | Freq: Two times a day (BID) | ORAL | Status: DC

## 2015-05-12 MED ORDER — LOSARTAN POTASSIUM 100 MG PO TABS: 100.0000 mg | ORAL_TABLET | Freq: Every day | ORAL | Status: DC

## 2015-05-12 MED ORDER — FLUTICASONE-SALMETEROL 250-50 MCG/DOSE IN AEPB: 1.0000 | INHALATION_SPRAY | Freq: Two times a day (BID) | RESPIRATORY_TRACT | Status: DC

## 2015-05-12 NOTE — Progress Notes (Signed)
Subjective:  Patient ID: Mia Contreras, female    DOB: January 24, 1929  Age: 81 y.o. MRN: [id number]  CC: No chief complaint on file.   HPI Mia Contreras presents for HTN, anxiety, COPD, HH f/u  Outpatient Prescriptions Prior to Visit  Medication Sig Dispense Refill  . ALPRAZolam (XANAX) 0.5 MG tablet Take 1 tablet (0.5 mg total) by mouth 2 (two) times daily as needed for anxiety or sleep. 60 tablet 3  . aspirin 325 MG tablet Take 325 mg by mouth daily.    . Calcium Carbonate-Vitamin D (CALCIUM 600+D) 600-400 MG-UNIT per tablet Take 1 tablet by mouth daily.    . carvedilol (COREG) 12.5 MG tablet TAKE 1 TABLET BY MOUTH TWICE A DAY WITH A MEAL 180 tablet 3  . diphenoxylate-atropine (LOMOTIL) 2.5-0.025 MG per tablet Take 1 tablet by mouth 4 (four) times daily as needed for diarrhea or loose stools. 60 tablet 0  . furosemide (LASIX) 20 MG tablet Take one  tablet every day 90 tablet 3  . Multiple Vitamins-Minerals (MULTIVITAMIN PO) Take 1 each by mouth daily.    Marland Kitchen spironolactone (ALDACTONE) 25 MG tablet Take 0.5 tablets (12.5 mg total) by mouth daily. 15 tablet 11  . ADVAIR DISKUS 250-50 MCG/DOSE AEPB INHALE 1 PUFF INTO THE LUNGS 2 (TWO) TIMES DAILY. 60 each 3  . losartan (COZAAR) 100 MG tablet Take 1 tablet (100 mg total) by mouth daily. 90 tablet 3   No facility-administered medications prior to visit.    ROS Review of Systems  Constitutional: Negative for chills, activity change, appetite change, fatigue and unexpected weight change.  HENT: Negative for congestion, mouth sores and sinus pressure.   Eyes: Negative for visual disturbance.  Respiratory: Positive for shortness of breath. Negative for cough and chest tightness.   Gastrointestinal: Negative for nausea and abdominal pain.  Genitourinary: Negative for frequency, difficulty urinating and vaginal pain.  Musculoskeletal: Negative for back pain, gait problem and neck stiffness.  Skin: Negative for pallor and rash.    Neurological: Negative for dizziness, tremors, weakness, numbness and headaches.  Psychiatric/Behavioral: Negative for suicidal ideas, confusion and sleep disturbance. The patient is not nervous/anxious.     Objective:  BP 150/80 mmHg  Pulse 57  Wt 138 lb (62.596 kg)  SpO2 94%  BP Readings from Last 3 Encounters:  05/12/15 150/80  02/10/15 160/78  12/09/14 160/84    Wt Readings from Last 3 Encounters:  05/12/15 138 lb (62.596 kg)  02/10/15 137 lb (62.143 kg)  12/09/14 139 lb 6.4 oz (63.231 kg)    Physical Exam  Constitutional: She appears well-developed. No distress.  HENT:  Head: Normocephalic.  Right Ear: External ear normal.  Left Ear: External ear normal.  Nose: Nose normal.  Mouth/Throat: Oropharynx is clear and moist.  Eyes: Conjunctivae are normal. Pupils are equal, round, and reactive to light. Right eye exhibits no discharge. Left eye exhibits no discharge.  Neck: Normal range of motion. Neck supple. No JVD present. No tracheal deviation present. No thyromegaly present.  Cardiovascular: Normal rate, regular rhythm and normal heart sounds.   Pulmonary/Chest: No stridor. No respiratory distress. She has no wheezes.  Abdominal: Soft. Bowel sounds are normal. She exhibits no distension and no mass. There is no tenderness. There is no rebound and no guarding.  Musculoskeletal: She exhibits no edema or tenderness.  Lymphadenopathy:    She has no cervical adenopathy.  Neurological: She displays normal reflexes. No cranial nerve deficit. She exhibits normal muscle tone. Coordination normal.  Skin: No rash noted. No erythema.  Psychiatric: She has a normal mood and affect. Her behavior is normal. Judgment and thought content normal.    Lab Results  Component Value Date   WBC 6.4 05/06/2015   HGB 12.8 05/06/2015   HCT 38.4 05/06/2015   PLT 239.0 05/06/2015   GLUCOSE 92 05/06/2015   CHOL 165 05/06/2015   TRIG 88.0 05/06/2015   HDL 54.60 05/06/2015   LDLCALC 93  05/06/2015   ALT 18 05/06/2015   AST 26 05/06/2015   NA 142 05/06/2015   K 4.7 05/06/2015   CL 103 05/06/2015   CREATININE 1.11 05/06/2015   BUN 21 05/06/2015   CO2 30 05/06/2015   TSH 2.26 05/06/2015   INR 0.95 10/25/2011   HGBA1C 5.8 02/06/2014   MICROALBUR 0.6 03/23/2011    Dg Chest 2 View  02/10/2015   CLINICAL DATA:  Routine checkup, cough, congestion for 10 days. Chronic systolic heart failure.  EXAM: CHEST  2 VIEW  COMPARISON:  10/25/2011  FINDINGS: Cardiomegaly. Large hiatal hernia. Compressive atelectasis in the adjacent left lower lobe. No confluent opacities or effusions. Surgical clips in the left breast and axilla. No acute bony abnormality.  IMPRESSION: Cardiomegaly.  Large hiatal hernia.  No active disease.   Electronically Signed   By: Rolm Baptise M.D.   On: 02/10/2015 15:24    Assessment & Plan:   Diagnoses and all orders for this visit:  Hiatal hernia  Need for influenza vaccination -     Flu Vaccine QUAD 36+ mos IM  Other orders -     losartan (COZAAR) 100 MG tablet; Take 1 tablet (100 mg total) by mouth daily. -     Fluticasone-Salmeterol (ADVAIR DISKUS) 250-50 MCG/DOSE AEPB; Inhale 1 puff into the lungs 2 (two) times daily. -     ranitidine (ZANTAC) 150 MG tablet; Take 1 tablet (150 mg total) by mouth 2 (two) times daily.   I have changed Mia Contreras's ADVAIR DISKUS to Fluticasone-Salmeterol. I am also having her start on ranitidine. Additionally, I am having her maintain her Calcium Carbonate-Vitamin D, aspirin, Multiple Vitamins-Minerals (MULTIVITAMIN PO), diphenoxylate-atropine, spironolactone, carvedilol, furosemide, ALPRAZolam, and losartan.  Meds ordered this encounter  Medications  . losartan (COZAAR) 100 MG tablet    Sig: Take 1 tablet (100 mg total) by mouth daily.    Dispense:  90 tablet    Refill:  3  . Fluticasone-Salmeterol (ADVAIR DISKUS) 250-50 MCG/DOSE AEPB    Sig: Inhale 1 puff into the lungs 2 (two) times daily.    Dispense:  60 each     Refill:  3  . ranitidine (ZANTAC) 150 MG tablet    Sig: Take 1 tablet (150 mg total) by mouth 2 (two) times daily.    Dispense:  180 tablet    Refill:  3     Follow-up: Return in about 4 months (around 09/11/2015) for a follow-up visit.  Walker Kehr, MD

## 2015-05-12 NOTE — Assessment & Plan Note (Signed)
Chronic. Large. ?rep sx's Empiric Zantac

## 2015-05-12 NOTE — Assessment & Plan Note (Signed)
Treat HH to see if it would help SOB Advair

## 2015-05-12 NOTE — Progress Notes (Signed)
Pre visit review using our clinic review tool, if applicable. No additional management support is needed unless otherwise documented below in the visit note. 

## 2015-05-12 NOTE — Assessment & Plan Note (Signed)
Coreg, Furosemide, Losartan

## 2015-06-07 NOTE — Progress Notes (Signed)
Patient ID: Mia Contreras, female   DOB: [birth year], 81 y.o.   MRN: [id number] Mia Contreras is a 81 y.o.  female with a history of nonischemic cardiomyopathy, systolic CHF, PACs, breast cancer. I saw her in f/u 08/22/13. She continued to noted DOE. She admitted to being out of her Advair for ~ 2 mos. I refilled this. Her BNP remained elevated on Lasix and aldactone added  Lexiscan Myoview (04/2011): No ischemia, EF 32%.   Echocardiogram (12/2012): EF 20%, diffuse HK, diastolic dysfunction, aortic sclerosis without stenosis, mild MR, mild to moderate LAE, mildly reduced RVSF, PASP 38.  She was recently placed on a Z-Pak for sinusitis. She continues to note NYHA class 2-2b dyspnea. There has been no improvement with resuming Advair. She denies chest pain, syncope, orthopnea, PND or edema.  Lots of questions about risk of MI and signs of worsening CHF  Weighs herself at home biweekly 135 lbs  Stable no edema   Has been to wound center before for slowly healing leg wounds.    Echo 12/16/14 reviewed: Impressions:  - Normal LV size with EF 30-35%, diffuse hypokinesis. Normal RV size with mildly decreased systolic function. Moderate to severe LAE. Mild MR. Moderate pulmonary hypertension.   ROS: Denies fever, malais, weight loss, blurry vision, decreased visual acuity, cough, sputum, SOB, hemoptysis, pleuritic pain, palpitaitons, heartburn, abdominal pain, melena, lower extremity edema, claudication, or rash.  All other systems reviewed and negative  General: Affect appropriate Healthy:  appears stated age 82: normal Neck supple with no adenopathy JVP normal no bruits no thyromegaly Lungs clear with no wheezing and good diaphragmatic motion Heart:  S1/S2 SEM  murmur, no rub, gallop or click PMI normal Abdomen: benighn, BS positve, no tenderness, no AAA no bruit.  No HSM or HJR Distal pulses intact with no bruits Chronic venous disease bilaterally with plus one LLE edema  Neuro  non-focal Skin warm and dry No muscular weakness   Current Outpatient Prescriptions  Medication Sig Dispense Refill  . ALPRAZolam (XANAX) 0.5 MG tablet Take 1 tablet (0.5 mg total) by mouth 2 (two) times daily as needed for anxiety or sleep. 60 tablet 3  . aspirin 325 MG tablet Take 325 mg by mouth daily.    . Calcium Carbonate-Vitamin D (CALCIUM 600+D) 600-400 MG-UNIT per tablet Take 1 tablet by mouth daily.    . carvedilol (COREG) 12.5 MG tablet TAKE 1 TABLET BY MOUTH TWICE A DAY WITH A MEAL 180 tablet 3  . diphenoxylate-atropine (LOMOTIL) 2.5-0.025 MG per tablet Take 1 tablet by mouth 4 (four) times daily as needed for diarrhea or loose stools. 60 tablet 0  . Fluticasone-Salmeterol (ADVAIR DISKUS) 250-50 MCG/DOSE AEPB Inhale 1 puff into the lungs 2 (two) times daily. 60 each 3  . furosemide (LASIX) 20 MG tablet Take one  tablet every day 90 tablet 3  . losartan (COZAAR) 100 MG tablet Take 1 tablet (100 mg total) by mouth daily. 90 tablet 3  . Multiple Vitamins-Minerals (MULTIVITAMIN PO) Take 1 each by mouth daily.    . ranitidine (ZANTAC) 150 MG tablet Take 1 tablet (150 mg total) by mouth 2 (two) times daily. 180 tablet 3  . spironolactone (ALDACTONE) 25 MG tablet Take 0.5 tablets (12.5 mg total) by mouth daily. 15 tablet 11   No current facility-administered medications for this visit.    Allergies  Cefuroxime; Celecoxib; Codeine; Ranitidine; and Tramadol hcl  Electrocardiogram:   06/10/14  SR rate 84 LAD old IMI  Long PR  PAC   12/09/14  SR rate 63  LAD poor R wave progression   Assessment and Plan CHF  Continue current meds   BMET in March was fine   Venous Disease:  F/u plotnicov  Cut has healed  Continue diuretics to minimize edema COPD:  Continue advair no active wheezing claritin and flonase for allergies HTN:  She will take readings at home given low EF need to be more aggressive about lowering.  She is compliant with meds Add hydralazine 25 tid  F/u in 6 weeks   F/U  with me 6 weeks

## 2015-06-09 ENCOUNTER — Encounter: Payer: Self-pay | Admitting: Cardiovascular Disease

## 2015-06-09 ENCOUNTER — Ambulatory Visit (INDEPENDENT_AMBULATORY_CARE_PROVIDER_SITE_OTHER): Payer: Medicare Other | Admitting: Cardiovascular Disease

## 2015-06-09 VITALS — BP 170/70 | HR 62 | Ht 63.0 in | Wt 140.6 lb

## 2015-06-09 DIAGNOSIS — I10 Essential (primary) hypertension: Secondary | ICD-10-CM | POA: Diagnosis not present

## 2015-06-09 MED ORDER — HYDRALAZINE HCL 25 MG PO TABS: 25.0000 mg | ORAL_TABLET | Freq: Three times a day (TID) | ORAL | Status: DC

## 2015-06-09 NOTE — Patient Instructions (Signed)
Your physician has recommended you make the following change in your medication:  START  HYDRALAZINE  25 MG   1  TAB  THREE TIMES  A  DAY  Your physician recommends that you schedule a follow-up appointment in: [place]

## 2015-06-21 ENCOUNTER — Telehealth: Payer: Self-pay | Admitting: Cardiovascular Disease

## 2015-06-21 NOTE — Telephone Encounter (Signed)
Bid for hydralazine is fine but its working !!

## 2015-06-21 NOTE — Telephone Encounter (Signed)
PT  HAS NOTED  SINCE  STARTING HYDRALAZINE  25 MG  TID  B/P ARE RANGING   107-122/50-70    WITH  ONE READING OF  92/50   OCCURING ON  06-19-15 DID NOTE  NO ENERGY    YESTERDAY PT  ONLY  HAD  TAKEN BID  WITH  READINGS OF  112-60  WANTING TO KNOW  IF CAN ON LY  TAKE  BID  WILL DISCUSS WITH  DR Johnsie Cancel .[facility] [employer]

## 2015-06-21 NOTE — Telephone Encounter (Signed)
PT  AWARE./CY 

## 2015-06-21 NOTE — Telephone Encounter (Signed)
New message      Pt c/o BP issue: STAT if pt c/o blurred vision, one-sided weakness or slurred speech  1. What are your last 5 BP readings? 117/51, 112/60, 110/57, 105/52, 107/64  2. Are you having any other symptoms (ex. Dizziness, headache, blurred vision, passed out)? no  3. What is your BP issue? Pt thinks her bp is too low.  She is taking hydralazine 25mg .  She only took the rx b.i.d yesterday instead of t.i.d. Please advise

## 2015-07-05 ENCOUNTER — Ambulatory Visit (INDEPENDENT_AMBULATORY_CARE_PROVIDER_SITE_OTHER): Payer: Medicare Other | Admitting: Internal Medicine

## 2015-07-05 ENCOUNTER — Encounter: Payer: Self-pay | Admitting: Internal Medicine

## 2015-07-05 VITALS — BP 148/72 | HR 68 | Temp 97.9°F | Wt 138.0 lb

## 2015-07-05 DIAGNOSIS — N182 Chronic kidney disease, stage 2 (mild): Secondary | ICD-10-CM | POA: Diagnosis not present

## 2015-07-05 DIAGNOSIS — I10 Essential (primary) hypertension: Secondary | ICD-10-CM

## 2015-07-05 DIAGNOSIS — J4520 Mild intermittent asthma, uncomplicated: Secondary | ICD-10-CM | POA: Diagnosis not present

## 2015-07-05 DIAGNOSIS — J449 Chronic obstructive pulmonary disease, unspecified: Secondary | ICD-10-CM

## 2015-07-05 MED ORDER — BENZONATATE 200 MG PO CAPS: 200.0000 mg | ORAL_CAPSULE | Freq: Two times a day (BID) | ORAL | Status: DC | PRN

## 2015-07-05 MED ORDER — METHYLPREDNISOLONE ACETATE 80 MG/ML IJ SUSP
80.0000 mg | Freq: Once | INTRAMUSCULAR | Status: AC
  Administered 2015-07-05: 80 mg via INTRAMUSCULAR

## 2015-07-05 MED ORDER — DOXYCYCLINE HYCLATE 100 MG PO TABS: 100.0000 mg | ORAL_TABLET | Freq: Two times a day (BID) | ORAL | Status: DC

## 2015-07-05 NOTE — Progress Notes (Signed)
Subjective:  Patient ID: Mia Contreras, female    DOB: [birth year]  Age: 81 y.o. MRN: [id number]  CC: No chief complaint on file.   HPI Mia Contreras presents for URI sx's x 1 week. F/u HTN, anxiety  Outpatient Prescriptions Prior to Visit  Medication Sig Dispense Refill  . ALPRAZolam (XANAX) 0.5 MG tablet Take 1 tablet (0.5 mg total) by mouth 2 (two) times daily as needed for anxiety or sleep. 60 tablet 3  . aspirin 325 MG tablet Take 325 mg by mouth daily.    . Calcium Carbonate-Vitamin D (CALCIUM 600+D) 600-400 MG-UNIT per tablet Take 1 tablet by mouth daily.    . carvedilol (COREG) 12.5 MG tablet TAKE 1 TABLET BY MOUTH TWICE A DAY WITH A MEAL 180 tablet 3  . diphenoxylate-atropine (LOMOTIL) 2.5-0.025 MG per tablet Take 1 tablet by mouth 4 (four) times daily as needed for diarrhea or loose stools. 60 tablet 0  . Fluticasone-Salmeterol (ADVAIR DISKUS) 250-50 MCG/DOSE AEPB Inhale 1 puff into the lungs 2 (two) times daily. 60 each 3  . furosemide (LASIX) 20 MG tablet Take one  tablet every day (Patient taking differently: Take 20 mg by mouth daily. Take one  tablet every day) 90 tablet 3  . hydrALAZINE (APRESOLINE) 25 MG tablet Take 1 tablet (25 mg total) by mouth 3 (three) times daily. (Patient taking differently: Take 25 mg by mouth 2 (two) times daily. ) 270 tablet 3  . losartan (COZAAR) 100 MG tablet Take 1 tablet (100 mg total) by mouth daily. 90 tablet 3  . Multiple Vitamins-Minerals (MULTIVITAMIN PO) Take 1 tablet by mouth daily.     Marland Kitchen spironolactone (ALDACTONE) 25 MG tablet Take 0.5 tablets (12.5 mg total) by mouth daily. 15 tablet 11  . ranitidine (ZANTAC) 150 MG tablet Take 1 tablet (150 mg total) by mouth 2 (two) times daily. (Patient not taking: Reported on 07/05/2015) 180 tablet 3   No facility-administered medications prior to visit.    ROS Review of Systems  Constitutional: Negative for chills, activity change, appetite change, fatigue and unexpected weight change.    HENT: Positive for postnasal drip, sinus pressure and sore throat. Negative for congestion and mouth sores.   Eyes: Negative for visual disturbance.  Respiratory: Positive for cough. Negative for chest tightness.   Gastrointestinal: Negative for nausea and abdominal pain.  Genitourinary: Negative for frequency, difficulty urinating and vaginal pain.  Musculoskeletal: Negative for back pain and gait problem.  Skin: Negative for pallor and rash.  Neurological: Negative for dizziness, tremors, weakness, numbness and headaches.  Psychiatric/Behavioral: Negative for suicidal ideas, confusion and sleep disturbance.    Objective:  BP 148/72 mmHg  Pulse 68  Temp(Src) 97.9 F (36.6 C) (Oral)  Wt 138 lb (62.596 kg)  SpO2 92%  BP Readings from Last 3 Encounters:  07/05/15 148/72  06/09/15 170/70  05/12/15 150/80    Wt Readings from Last 3 Encounters:  07/05/15 138 lb (62.596 kg)  06/09/15 140 lb 9.6 oz (63.776 kg)  05/12/15 138 lb (62.596 kg)    Physical Exam  Constitutional: She appears well-developed. No distress.  HENT:  Head: Normocephalic.  Right Ear: External ear normal.  Left Ear: External ear normal.  Nose: Nose normal.  Mouth/Throat: Oropharynx is clear and moist.  Eyes: Conjunctivae are normal. Pupils are equal, round, and reactive to light. Right eye exhibits no discharge. Left eye exhibits no discharge.  Neck: Normal range of motion. Neck supple. No JVD present. No tracheal deviation present.  No thyromegaly present.  Cardiovascular: Normal rate, regular rhythm and normal heart sounds.   Pulmonary/Chest: No stridor. No respiratory distress. She has no wheezes.  Abdominal: Soft. Bowel sounds are normal. She exhibits no distension and no mass. There is no tenderness. There is no rebound and no guarding.  Musculoskeletal: She exhibits no edema or tenderness.  Lymphadenopathy:    She has no cervical adenopathy.  Neurological: She displays normal reflexes. No cranial nerve  deficit. She exhibits normal muscle tone. Coordination normal.  Skin: No rash noted. No erythema.  Psychiatric: She has a normal mood and affect. Her behavior is normal. Judgment and thought content normal.    Lab Results  Component Value Date   WBC 6.4 05/06/2015   HGB 12.8 05/06/2015   HCT 38.4 05/06/2015   PLT 239.0 05/06/2015   GLUCOSE 92 05/06/2015   CHOL 165 05/06/2015   TRIG 88.0 05/06/2015   HDL 54.60 05/06/2015   LDLCALC 93 05/06/2015   ALT 18 05/06/2015   AST 26 05/06/2015   NA 142 05/06/2015   K 4.7 05/06/2015   CL 103 05/06/2015   CREATININE 1.11 05/06/2015   BUN 21 05/06/2015   CO2 30 05/06/2015   TSH 2.26 05/06/2015   INR 0.95 10/25/2011   HGBA1C 5.8 02/06/2014   MICROALBUR 0.6 03/23/2011    Dg Chest 2 View  02/10/2015  CLINICAL DATA:  Routine checkup, cough, congestion for 10 days. Chronic systolic heart failure. EXAM: CHEST  2 VIEW COMPARISON:  10/25/2011 FINDINGS: Cardiomegaly. Large hiatal hernia. Compressive atelectasis in the adjacent left lower lobe. No confluent opacities or effusions. Surgical clips in the left breast and axilla. No acute bony abnormality. IMPRESSION: Cardiomegaly.  Large hiatal hernia.  No active disease. Electronically Signed   By: Rolm Baptise M.D.   On: 02/10/2015 15:24    Assessment & Plan:   Diagnoses and all orders for this visit:  Essential hypertension  Chronic obstructive pulmonary disease, unspecified COPD type ([place])  Asthmatic bronchitis, mild intermittent, uncomplicated -     methylPREDNISolone acetate (DEPO-MEDROL) injection 80 mg; Inject 1 mL (80 mg total) into the muscle once.  Other orders -     Discontinue: benzonatate (TESSALON) 200 MG capsule; Take 1 capsule (200 mg total) by mouth 2 (two) times daily as needed for cough. -     Discontinue: doxycycline (VIBRA-TABS) 100 MG tablet; Take 1 tablet (100 mg total) by mouth 2 (two) times daily. -     benzonatate (TESSALON) 200 MG capsule; Take 1 capsule (200 mg  total) by mouth 2 (two) times daily as needed for cough. -     doxycycline (VIBRA-TABS) 100 MG tablet; Take 1 tablet (100 mg total) by mouth 2 (two) times daily.  I am having Ms. Longhi maintain her Calcium Carbonate-Vitamin D, aspirin, Multiple Vitamins-Minerals (MULTIVITAMIN PO), diphenoxylate-atropine, spironolactone, carvedilol, furosemide, ALPRAZolam, losartan, Fluticasone-Salmeterol, ranitidine, hydrALAZINE, benzonatate, and doxycycline. We administered methylPREDNISolone acetate.  Meds ordered this encounter  Medications  . DISCONTD: benzonatate (TESSALON) 200 MG capsule    Sig: Take 1 capsule (200 mg total) by mouth 2 (two) times daily as needed for cough.    Dispense:  30 capsule    Refill:  1  . DISCONTD: doxycycline (VIBRA-TABS) 100 MG tablet    Sig: Take 1 tablet (100 mg total) by mouth 2 (two) times daily.    Dispense:  20 tablet    Refill:  0  . benzonatate (TESSALON) 200 MG capsule    Sig: Take 1 capsule (200 mg  total) by mouth 2 (two) times daily as needed for cough.    Dispense:  30 capsule    Refill:  1  . doxycycline (VIBRA-TABS) 100 MG tablet    Sig: Take 1 tablet (100 mg total) by mouth 2 (two) times daily.    Dispense:  20 tablet    Refill:  0  . methylPREDNISolone acetate (DEPO-MEDROL) injection 80 mg    Sig:      Follow-up: No Follow-up on file.  Walker Kehr, MD

## 2015-07-05 NOTE — Assessment & Plan Note (Addendum)
Doxy x10 d Tessalon Rx Depomedrol 80 mg IM - risks discussed

## 2015-07-05 NOTE — Assessment & Plan Note (Signed)
Monitoring labs 

## 2015-07-05 NOTE — Progress Notes (Signed)
Pre visit review using our clinic review tool, if applicable. No additional management support is needed unless otherwise documented below in the visit note. 

## 2015-07-05 NOTE — Assessment & Plan Note (Signed)
Losartan, Coreg,Spironolactone,  Hydralazine bid due to low BP at home

## 2015-07-05 NOTE — Assessment & Plan Note (Signed)
advair bid

## 2015-07-09 ENCOUNTER — Telehealth: Payer: Self-pay | Admitting: Internal Medicine

## 2015-07-09 NOTE — Telephone Encounter (Signed)
pls cont abx. Let me know in 3-4 d if not better Thx

## 2015-07-09 NOTE — Telephone Encounter (Signed)
Patient called to advise that she has not seen any improvement over the last 5 days. She is taking meds as prescribed. Please advise

## 2015-07-12 MED ORDER — LEVOFLOXACIN 500 MG PO TABS: 500.0000 mg | ORAL_TABLET | Freq: Every day | ORAL | Status: DC

## 2015-07-12 NOTE — Telephone Encounter (Signed)
Pt states that she is improving. She has only one dose left. Should she have another rx of abx.

## 2015-07-12 NOTE — Telephone Encounter (Signed)
Ok Levaquin 500 mg x 7d Thx

## 2015-07-13 NOTE — Telephone Encounter (Signed)
LVM for pt to call back as soon as possible. Message was detailed with info below.  RE: Md response below.

## 2015-07-16 ENCOUNTER — Other Ambulatory Visit: Payer: Self-pay | Admitting: Internal Medicine

## 2015-07-22 ENCOUNTER — Encounter: Payer: Self-pay | Admitting: Cardiovascular Disease

## 2015-07-22 NOTE — Progress Notes (Signed)
Patient ID: Mia Contreras, female   DOB: 1928/09/11, 81 y.o.   MRN: [id number] Mia Contreras is a 81 y.o.  female with a history of nonischemic cardiomyopathy, systolic CHF, PACs, breast cancer. I saw her in f/u 08/22/13. She continued to noted DOE. She admitted to being out of her Advair for ~ 2 mos. I refilled this. Her BNP remained elevated on Lasix and aldactone added  Lexiscan Myoview (04/2011): No ischemia, EF 32%.   Echocardiogram (12/2012): EF 20%, diffuse HK, diastolic dysfunction, aortic sclerosis without stenosis, mild MR, mild to moderate LAE, mildly reduced RVSF, PASP 38.  She was recently placed on a Z-Pak for sinusitis. She continues to note NYHA class 2-2b dyspnea. There has been no improvement with resuming Advair. She denies chest pain, syncope, orthopnea, PND or edema.  Lots of questions about risk of MI and signs of worsening CHF  Weighs herself at home biweekly 135 lbs  Stable no edema   Has been to wound center before for slowly healing leg wounds.    Echo 12/16/14 reviewed: Impressions:  - Normal LV size with EF 30-35%, diffuse hypokinesis. Normal RV size with mildly decreased systolic function. Moderate to severe LAE. Mild MR. Moderate pulmonary hypertension.  Recent Rx Levaquin for bronchitis   ROS: Denies fever, malais, weight loss, blurry vision, decreased visual acuity, cough, sputum, SOB, hemoptysis, pleuritic pain, palpitaitons, heartburn, abdominal pain, melena, lower extremity edema, claudication, or rash.  All other systems reviewed and negative  General: Affect appropriate Healthy:  appears stated age 81: normal Neck supple with no adenopathy JVP normal no bruits no thyromegaly Lungs clear with no wheezing and good diaphragmatic motion Heart:  S1/S2 SEM  murmur, no rub, gallop or click PMI normal Abdomen: benighn, BS positve, no tenderness, no AAA no bruit.  No HSM or HJR Distal pulses intact with no bruits Chronic venous disease bilaterally  with plus one LLE edema  Neuro non-focal Skin warm and dry No muscular weakness   Current Outpatient Prescriptions  Medication Sig Dispense Refill  . ALPRAZolam (XANAX) 0.5 MG tablet Take 1 tablet (0.5 mg total) by mouth 2 (two) times daily as needed for anxiety or sleep. 60 tablet 3  . aspirin 325 MG tablet Take 325 mg by mouth daily.    . benzonatate (TESSALON) 200 MG capsule Take 1 capsule (200 mg total) by mouth 2 (two) times daily as needed for cough. 30 capsule 1  . Calcium Carbonate-Vitamin D (CALCIUM 600+D) 600-400 MG-UNIT per tablet Take 1 tablet by mouth daily.    . carvedilol (COREG) 12.5 MG tablet TAKE 1 TABLET BY MOUTH TWICE A DAY WITH A MEAL 180 tablet 3  . diphenoxylate-atropine (LOMOTIL) 2.5-0.025 MG per tablet Take 1 tablet by mouth 4 (four) times daily as needed for diarrhea or loose stools. 60 tablet 0  . Fluticasone-Salmeterol (ADVAIR DISKUS) 250-50 MCG/DOSE AEPB Inhale 1 puff into the lungs 2 (two) times daily. 60 each 3  . furosemide (LASIX) 20 MG tablet Take 20 mg by mouth daily.    . hydrALAZINE (APRESOLINE) 25 MG tablet Take 25 mg by mouth 2 (two) times daily.    Marland Kitchen losartan (COZAAR) 100 MG tablet TAKE 1 TABLET BY MOUTH EVERY DAY 90 tablet 3  . Multiple Vitamins-Minerals (MULTIVITAMIN PO) Take 1 tablet by mouth daily.     Marland Kitchen spironolactone (ALDACTONE) 25 MG tablet Take 0.5 tablets (12.5 mg total) by mouth daily. 15 tablet 11   No current facility-administered medications for this visit.  Allergies  Cefuroxime; Celecoxib; Codeine; Ranitidine; and Tramadol hcl  Electrocardiogram:   06/10/14  SR rate 49 LAD old IMI  Long PR  PAC   12/09/14  SR rate 63  LAD poor R wave progression   Assessment and Plan CHF  Continue current meds   BMET in March was fine   Venous Disease:  F/u plotnicov  Cut has healed  Continue diuretics to minimize edema COPD:  Continue advair no active wheezing claritin and flonase for allergies HTN:  Improved with bid hydralazine   Jenkins Rouge

## 2015-07-23 ENCOUNTER — Encounter: Payer: Self-pay | Admitting: Cardiovascular Disease

## 2015-07-23 ENCOUNTER — Ambulatory Visit (INDEPENDENT_AMBULATORY_CARE_PROVIDER_SITE_OTHER): Payer: Medicare Other | Admitting: Cardiovascular Disease

## 2015-07-23 VITALS — BP 106/60 | HR 55 | Ht 63.0 in | Wt 140.6 lb

## 2015-07-23 DIAGNOSIS — I10 Essential (primary) hypertension: Secondary | ICD-10-CM | POA: Diagnosis not present

## 2015-07-23 NOTE — Patient Instructions (Addendum)

## 2015-07-30 ENCOUNTER — Ambulatory Visit (INDEPENDENT_AMBULATORY_CARE_PROVIDER_SITE_OTHER): Payer: Medicare Other | Admitting: Endocrinology

## 2015-07-30 VITALS — BP 124/76 | HR 72 | Temp 97.8°F | Ht 63.0 in | Wt 138.0 lb

## 2015-07-30 DIAGNOSIS — E042 Nontoxic multinodular goiter: Secondary | ICD-10-CM | POA: Diagnosis not present

## 2015-07-30 NOTE — Patient Instructions (Addendum)
No thyroid medication or further testing is needed now. You should have the thyroid blood test each year.  If it become overactive, or if you feel the thyroid is enlarging, please come back here.  most of the time, a "lumpy thyroid" will eventually become overactive again.  this is usually a slow process, happening over the span of many years.

## 2015-07-30 NOTE — Progress Notes (Signed)
Subjective:    Patient ID: Mia Contreras, female    DOB: [birth year], 81 y.o.   MRN: [id number]  HPI Pt returns for f/u of large multinodular goiter (dx'ed 2008; bx then showed HYPERPLASTICNODULE. in 2011, pt had i-131 rx for associated hyperthyroidism; she became euthyroid, and has not required any medication; last [name] in early 2015 was unchanged).  pt states she feels well in general, and does not notice any change in the goiter Past Medical History  Diagnosis Date  . GOITER, MULTINODULAR 05/06/2010    Dr [name]  . HYPERTHYROIDISM 05/23/2010  . HYPERLIPIDEMIA 08/20/2007  . ANEMIA-NOS 08/20/2007  . ANXIETY 03/16/2007  . CARPAL TUNNEL SYNDROME, RIGHT 01/11/2008  . HYPERTENSION 03/16/2007  . VENOUS INSUFFICIENCY 09/05/2007  . DIVERTICULOSIS, COLON 08/20/2007  . CELLULITIS, LEG, RIGHT 08/20/2007  . OSTEOARTHRITIS 03/16/2007  . OSTEOPOROSIS 08/20/2007  . SCOLIOSIS 01/11/2008  . BREAST CANCER, HX OF 03/16/2007  . SKIN CANCER, HX OF 11/12/2008     R cheek 2010, L Cheek 2011 Dr. Tonia Brooms  . PREMATURE ATRIAL CONTRACTIONS 09/21/2010  . Thyroid nodule   . Cataract     floaters  . Pneumonia 07/2011    took abx for several weeks  . IBS (irritable bowel syndrome)   . Collagenous colitis     Past Surgical History  Procedure Laterality Date  . Total knee arthroplasty    . Breast lumpectomy Left 2001  . Abdominal hysterectomy    . Appendectomy  1972  . Mastectomy, partial Left   . Carpal tunnel release    . Total knee arthroplasty Right 2010    Dr Para March  . Cataract extraction    . Skin cancer excision      face-Dr. Bing Plume  . Xrt      chemo, surgery  . Eye surgery      cataract ext  . Total knee arthroplasty  10/30/2011    Procedure: TOTAL KNEE ARTHROPLASTY;  Surgeon: Lorn Junes, MD;  Location: [facility];  Service: Orthopedics;  Laterality: Left;     . Hernia repair      Social History   Social History  . Marital Status: Single    Spouse Name: N/A  . Number of Children: N/A  .  Years of Education: N/A   Occupational History  . Not on file.   Social History Main Topics  . Smoking status: Former Smoker    Quit date: 12/20/1980  . Smokeless tobacco: Never Used  . Alcohol Use: Yes     Comment: socially  . Drug Use: No  . Sexual Activity: Not Currently   Other Topics Concern  . Not on file   Social History Narrative    Current Outpatient Prescriptions on File Prior to Visit  Medication Sig Dispense Refill  . ALPRAZolam (XANAX) 0.5 MG tablet Take 1 tablet (0.5 mg total) by mouth 2 (two) times daily as needed for anxiety or sleep. 60 tablet 3  . aspirin 325 MG tablet Take 325 mg by mouth daily.    . benzonatate (TESSALON) 200 MG capsule Take 1 capsule (200 mg total) by mouth 2 (two) times daily as needed for cough. 30 capsule 1  . Calcium Carbonate-Vitamin D (CALCIUM 600+D) 600-400 MG-UNIT per tablet Take 1 tablet by mouth daily.    . carvedilol (COREG) 12.5 MG tablet TAKE 1 TABLET BY MOUTH TWICE A DAY WITH A MEAL 180 tablet 3  . diphenoxylate-atropine (LOMOTIL) 2.5-0.025 MG per tablet Take 1 tablet by mouth 4 (four) times  daily as needed for diarrhea or loose stools. 60 tablet 0  . Fluticasone-Salmeterol (ADVAIR DISKUS) 250-50 MCG/DOSE AEPB Inhale 1 puff into the lungs 2 (two) times daily. 60 each 3  . furosemide (LASIX) 20 MG tablet Take 20 mg by mouth daily.    . hydrALAZINE (APRESOLINE) 25 MG tablet Take 25 mg by mouth 2 (two) times daily.    Marland Kitchen losartan (COZAAR) 100 MG tablet TAKE 1 TABLET BY MOUTH EVERY DAY 90 tablet 3  . Multiple Vitamins-Minerals (MULTIVITAMIN PO) Take 1 tablet by mouth daily.     Marland Kitchen spironolactone (ALDACTONE) 25 MG tablet Take 0.5 tablets (12.5 mg total) by mouth daily. 15 tablet 11   No current facility-administered medications on file prior to visit.    Allergies  Allergen Reactions  . Cefuroxime Diarrhea  . Celecoxib Nausea Only  . Codeine Nausea Only  . Ranitidine     bloating  . Tramadol Hcl Nausea Only    Family History   Problem Relation Age of Onset  . Dementia Mother   . Heart disease Mother   . Mental retardation Mother   . Colon cancer Neg Hx   . Hypertension Mother   . Alzheimer's disease Mother   . Prostate cancer Brother   . Diabetes Other   . Heart attack Father   . Alcohol abuse Father   . Anesthesia problems Neg Hx     BP 124/76 mmHg  Pulse 72  Temp(Src) 97.8 F (36.6 C) (Oral)  Ht 5\' 3"  (1.6 m)  Wt 138 lb (62.596 kg)  BMI 24.45 kg/m2  SpO2 92%   Review of Systems Denies dysphagia.     Objective:   Physical Exam VITAL SIGNS:  See vs page GENERAL: no distress Neck: large multinodular goiter (L>R) is again noted.     Lab Results  Component Value Date   TSH 2.26 05/06/2015      Assessment & Plan:  Multinodular goiter: We discussed, and we agree no further [name] is needed.    Patient is advised the following: Patient Instructions  No thyroid medication or further testing is needed now. You should have the thyroid blood test each year.  If it become overactive, or if you feel the thyroid is enlarging, please come back here.  most of the time, a "lumpy thyroid" will eventually become overactive again.  this is usually a slow process, happening over the span of many years.

## 2015-08-11 ENCOUNTER — Ambulatory Visit (INDEPENDENT_AMBULATORY_CARE_PROVIDER_SITE_OTHER): Payer: Medicare Other | Admitting: Internal Medicine

## 2015-08-11 ENCOUNTER — Encounter: Payer: Self-pay | Admitting: Internal Medicine

## 2015-08-11 VITALS — BP 154/74 | HR 64 | Wt 138.0 lb

## 2015-08-11 DIAGNOSIS — J4520 Mild intermittent asthma, uncomplicated: Secondary | ICD-10-CM | POA: Diagnosis not present

## 2015-08-11 DIAGNOSIS — I10 Essential (primary) hypertension: Secondary | ICD-10-CM

## 2015-08-11 DIAGNOSIS — J449 Chronic obstructive pulmonary disease, unspecified: Secondary | ICD-10-CM

## 2015-08-11 DIAGNOSIS — E0520 Thyrotoxicosis with toxic multinodular goiter without thyrotoxic crisis or storm: Secondary | ICD-10-CM | POA: Diagnosis not present

## 2015-08-11 MED ORDER — ALPRAZOLAM 0.5 MG PO TABS: 0.5000 mg | ORAL_TABLET | Freq: Two times a day (BID) | ORAL | Status: DC | PRN

## 2015-08-11 MED ORDER — CARVEDILOL 12.5 MG PO TABS: 12.5000 mg | ORAL_TABLET | Freq: Two times a day (BID) | ORAL | Status: DC

## 2015-08-11 NOTE — Assessment & Plan Note (Signed)
Better  Advair

## 2015-08-11 NOTE — Progress Notes (Signed)
Pre visit review using our clinic review tool, if applicable. No additional management support is needed unless otherwise documented below in the visit note. 

## 2015-08-11 NOTE — Progress Notes (Signed)
Subjective:  Patient ID: Mia Contreras, female    DOB: 07/18/1929  Age: 81 y.o. MRN: [id number]  CC: No chief complaint on file.   HPI Mia Contreras presents for HTN, cough (better), anxiety f/u. SBP 120s at home  Outpatient Prescriptions Prior to Visit  Medication Sig Dispense Refill  . ALPRAZolam (XANAX) 0.5 MG tablet Take 1 tablet (0.5 mg total) by mouth 2 (two) times daily as needed for anxiety or sleep. 60 tablet 3  . aspirin 325 MG tablet Take 325 mg by mouth daily.    . benzonatate (TESSALON) 200 MG capsule Take 1 capsule (200 mg total) by mouth 2 (two) times daily as needed for cough. 30 capsule 1  . Calcium Carbonate-Vitamin D (CALCIUM 600+D) 600-400 MG-UNIT per tablet Take 1 tablet by mouth daily.    . carvedilol (COREG) 12.5 MG tablet TAKE 1 TABLET BY MOUTH TWICE A DAY WITH A MEAL 180 tablet 3  . diphenoxylate-atropine (LOMOTIL) 2.5-0.025 MG per tablet Take 1 tablet by mouth 4 (four) times daily as needed for diarrhea or loose stools. 60 tablet 0  . Fluticasone-Salmeterol (ADVAIR DISKUS) 250-50 MCG/DOSE AEPB Inhale 1 puff into the lungs 2 (two) times daily. 60 each 3  . furosemide (LASIX) 20 MG tablet Take 20 mg by mouth daily.    . hydrALAZINE (APRESOLINE) 25 MG tablet Take 25 mg by mouth 2 (two) times daily.    Marland Kitchen losartan (COZAAR) 100 MG tablet TAKE 1 TABLET BY MOUTH EVERY DAY 90 tablet 3  . Multiple Vitamins-Minerals (MULTIVITAMIN PO) Take 1 tablet by mouth daily.     Marland Kitchen spironolactone (ALDACTONE) 25 MG tablet Take 0.5 tablets (12.5 mg total) by mouth daily. 15 tablet 11   No facility-administered medications prior to visit.    ROS Review of Systems  Constitutional: Positive for unexpected weight change. Negative for chills, activity change, appetite change and fatigue.  HENT: Negative for congestion, mouth sores and sinus pressure.   Eyes: Negative for visual disturbance.  Respiratory: Positive for cough. Negative for chest tightness.   Gastrointestinal: Negative  for nausea and abdominal pain.  Genitourinary: Negative for frequency, difficulty urinating and vaginal pain.  Musculoskeletal: Negative for back pain and gait problem.  Skin: Negative for pallor and rash.  Neurological: Negative for dizziness, tremors, weakness, numbness and headaches.  Psychiatric/Behavioral: Negative for confusion and sleep disturbance.    Objective:  BP 154/74 mmHg  Pulse 64  Wt 135 lb (61.236 kg)  SpO2 93%  BP Readings from Last 3 Encounters:  08/11/15 154/74  07/30/15 124/76  07/23/15 106/60    Wt Readings from Last 3 Encounters:  08/11/15 135 lb (61.236 kg)  07/30/15 138 lb (62.596 kg)  07/23/15 140 lb 9.6 oz (63.776 kg)    Physical Exam  Constitutional: She appears well-developed. No distress.  HENT:  Head: Normocephalic.  Right Ear: External ear normal.  Left Ear: External ear normal.  Nose: Nose normal.  Mouth/Throat: Oropharynx is clear and moist.  Eyes: Conjunctivae are normal. Pupils are equal, round, and reactive to light. Right eye exhibits no discharge. Left eye exhibits no discharge.  Neck: Normal range of motion. Neck supple. No JVD present. No tracheal deviation present. No thyromegaly present.  Cardiovascular: Normal rate, regular rhythm and normal heart sounds.   Pulmonary/Chest: No stridor. No respiratory distress. She has no wheezes.  Abdominal: Soft. Bowel sounds are normal. She exhibits no distension and no mass. There is no tenderness. There is no rebound and no guarding.  Musculoskeletal: She exhibits no edema or tenderness.  Lymphadenopathy:    She has no cervical adenopathy.  Neurological: She displays normal reflexes. No cranial nerve deficit. She exhibits normal muscle tone. Coordination normal.  Skin: No rash noted. No erythema.  Psychiatric: She has a normal mood and affect. Her behavior is normal. Judgment and thought content normal.    Lab Results  Component Value Date   WBC 6.4 05/06/2015   HGB 12.8 05/06/2015    HCT 38.4 05/06/2015   PLT 239.0 05/06/2015   GLUCOSE 92 05/06/2015   CHOL 165 05/06/2015   TRIG 88.0 05/06/2015   HDL 54.60 05/06/2015   LDLCALC 93 05/06/2015   ALT 18 05/06/2015   AST 26 05/06/2015   NA 142 05/06/2015   K 4.7 05/06/2015   CL 103 05/06/2015   CREATININE 1.11 05/06/2015   BUN 21 05/06/2015   CO2 30 05/06/2015   TSH 2.26 05/06/2015   INR 0.95 10/25/2011   HGBA1C 5.8 02/06/2014   MICROALBUR 0.6 03/23/2011    Dg Chest 2 View  02/10/2015  CLINICAL DATA:  Routine checkup, cough, congestion for 10 days. Chronic systolic heart failure. EXAM: CHEST  2 VIEW COMPARISON:  10/25/2011 FINDINGS: Cardiomegaly. Large hiatal hernia. Compressive atelectasis in the adjacent left lower lobe. No confluent opacities or effusions. Surgical clips in the left breast and axilla. No acute bony abnormality. IMPRESSION: Cardiomegaly.  Large hiatal hernia.  No active disease. Electronically Signed   By: Rolm Baptise M.D.   On: 02/10/2015 15:24    Assessment & Plan:   There are no diagnoses linked to this encounter. I am having Mia Contreras maintain her Calcium Carbonate-Vitamin D, aspirin, Multiple Vitamins-Minerals (MULTIVITAMIN PO), diphenoxylate-atropine, spironolactone, carvedilol, ALPRAZolam, Fluticasone-Salmeterol, benzonatate, losartan, furosemide, and hydrALAZINE.  No orders of the defined types were placed in this encounter.     Follow-up: No Follow-up on file.  Walker Kehr, MD

## 2015-08-11 NOTE — Assessment & Plan Note (Signed)
BP at home has been WNL Losartan, Coreg,Spironolactone, Hydralazine

## 2015-08-11 NOTE — Assessment & Plan Note (Signed)
Resolved

## 2015-08-11 NOTE — Assessment & Plan Note (Signed)
Better  

## 2015-09-08 DIAGNOSIS — H04123 Dry eye syndrome of bilateral lacrimal glands: Secondary | ICD-10-CM | POA: Diagnosis not present

## 2015-09-08 DIAGNOSIS — Z961 Presence of intraocular lens: Secondary | ICD-10-CM | POA: Diagnosis not present

## 2015-09-08 DIAGNOSIS — H35373 Puckering of macula, bilateral: Secondary | ICD-10-CM | POA: Diagnosis not present

## 2015-10-19 ENCOUNTER — Telehealth: Payer: Self-pay | Admitting: Internal Medicine

## 2015-10-19 NOTE — Telephone Encounter (Signed)
Ok to work in tomorrow 10/20/15 per PCP. Lori to call and schedule.

## 2015-10-19 NOTE — Telephone Encounter (Signed)
Pt feels her bronchitis is coming back. She is coughing up phlegm and this morning she woke up wheezing and having a hard time breathing.  She's wanting to know if you can fit her in this morning.

## 2015-10-20 ENCOUNTER — Ambulatory Visit (INDEPENDENT_AMBULATORY_CARE_PROVIDER_SITE_OTHER)
Admission: RE | Admit: 2015-10-20 | Discharge: 2015-10-20 | Disposition: A | Discharge status: Home / Self Care | Payer: Medicare Other | Source: Ambulatory Visit | Attending: Internal Medicine | Admitting: Internal Medicine

## 2015-10-20 ENCOUNTER — Encounter: Payer: Self-pay | Admitting: Internal Medicine

## 2015-10-20 ENCOUNTER — Ambulatory Visit: Payer: Medicare Other | Admitting: Internal Medicine

## 2015-10-20 ENCOUNTER — Ambulatory Visit (INDEPENDENT_AMBULATORY_CARE_PROVIDER_SITE_OTHER): Payer: Medicare Other | Admitting: Internal Medicine

## 2015-10-20 VITALS — BP 120/70 | HR 76 | Temp 97.5°F | Wt 144.0 lb

## 2015-10-20 DIAGNOSIS — J4520 Mild intermittent asthma, uncomplicated: Secondary | ICD-10-CM

## 2015-10-20 DIAGNOSIS — R05 Cough: Secondary | ICD-10-CM | POA: Diagnosis not present

## 2015-10-20 DIAGNOSIS — N182 Chronic kidney disease, stage 2 (mild): Secondary | ICD-10-CM | POA: Diagnosis not present

## 2015-10-20 DIAGNOSIS — I10 Essential (primary) hypertension: Secondary | ICD-10-CM

## 2015-10-20 MED ORDER — METHYLPREDNISOLONE ACETATE 80 MG/ML IJ SUSP
80.0000 mg | Freq: Once | INTRAMUSCULAR | Status: AC
  Administered 2015-10-20: 80 mg via INTRAMUSCULAR

## 2015-10-20 MED ORDER — LEVOFLOXACIN 500 MG PO TABS: 500.0000 mg | ORAL_TABLET | Freq: Every day | ORAL | Status: DC

## 2015-10-20 MED ORDER — PREDNISONE 10 MG PO TABS: ORAL_TABLET | ORAL | Status: DC

## 2015-10-20 MED ORDER — BENZONATATE 200 MG PO CAPS
200.0000 mg | ORAL_CAPSULE | Freq: Two times a day (BID) | ORAL | Status: DC | PRN
Start: 2015-10-20 — End: 2015-12-10

## 2015-10-20 NOTE — Progress Notes (Signed)
Subjective:  Patient ID: Mia Contreras, female    DOB: Feb 16, 1929  Age: 82 y.o. MRN: [id number]  CC: Cough and Wheezing   HPI Mia Contreras presents for cough, wheezing and SOB x 1-2 weeks - worse in the last 5 days. Mucus is clear. Using Advair qd  Outpatient Prescriptions Prior to Visit  Medication Sig Dispense Refill  . ALPRAZolam (XANAX) 0.5 MG tablet Take 1 tablet (0.5 mg total) by mouth 2 (two) times daily as needed for anxiety or sleep. 60 tablet 3  . aspirin 325 MG tablet Take 325 mg by mouth daily.    . Calcium Carbonate-Vitamin D (CALCIUM 600+D) 600-400 MG-UNIT per tablet Take 1 tablet by mouth daily.    . carvedilol (COREG) 12.5 MG tablet Take 1 tablet (12.5 mg total) by mouth 2 (two) times daily with a meal. 180 tablet 3  . Fluticasone-Salmeterol (ADVAIR DISKUS) 250-50 MCG/DOSE AEPB Inhale 1 puff into the lungs 2 (two) times daily. 60 each 3  . furosemide (LASIX) 20 MG tablet Take 20 mg by mouth daily.    . hydrALAZINE (APRESOLINE) 25 MG tablet Take 25 mg by mouth 2 (two) times daily.    Marland Kitchen losartan (COZAAR) 100 MG tablet TAKE 1 TABLET BY MOUTH EVERY DAY 90 tablet 3  . Multiple Vitamins-Minerals (MULTIVITAMIN PO) Take 1 tablet by mouth daily.     Marland Kitchen spironolactone (ALDACTONE) 25 MG tablet Take 0.5 tablets (12.5 mg total) by mouth daily. 15 tablet 11  . diphenoxylate-atropine (LOMOTIL) 2.5-0.025 MG per tablet Take 1 tablet by mouth 4 (four) times daily as needed for diarrhea or loose stools. (Patient not taking: Reported on 10/20/2015) 60 tablet 0  . benzonatate (TESSALON) 200 MG capsule Take 1 capsule (200 mg total) by mouth 2 (two) times daily as needed for cough. (Patient not taking: Reported on 10/20/2015) 30 capsule 1   No facility-administered medications prior to visit.    ROS Review of Systems  Constitutional: Positive for fatigue. Negative for chills, activity change, appetite change and unexpected weight change.  HENT: Negative for congestion, mouth sores and sinus  pressure.   Eyes: Negative for visual disturbance.  Respiratory: Positive for cough, shortness of breath and wheezing. Negative for chest tightness.   Gastrointestinal: Negative for nausea and abdominal pain.  Genitourinary: Negative for frequency, difficulty urinating and vaginal pain.  Musculoskeletal: Negative for back pain and gait problem.  Skin: Negative for pallor and rash.  Neurological: Negative for dizziness, tremors, weakness, numbness and headaches.  Psychiatric/Behavioral: Negative for confusion and sleep disturbance. The patient is nervous/anxious.     Objective:  BP 120/70 mmHg  Pulse 76  Temp(Src) 97.5 F (36.4 C) (Oral)  Wt 144 lb (65.318 kg)  SpO2 95%  BP Readings from Last 3 Encounters:  10/20/15 120/70  08/11/15 154/74  07/30/15 124/76    Wt Readings from Last 3 Encounters:  10/20/15 144 lb (65.318 kg)  08/11/15 138 lb (62.596 kg)  07/30/15 138 lb (62.596 kg)    Physical Exam  Constitutional: She appears well-developed. No distress.  HENT:  Head: Normocephalic.  Right Ear: External ear normal.  Left Ear: External ear normal.  Nose: Nose normal.  Mouth/Throat: Oropharynx is clear and moist.  Eyes: Conjunctivae are normal. Pupils are equal, round, and reactive to light. Right eye exhibits no discharge. Left eye exhibits no discharge.  Neck: Normal range of motion. Neck supple. No JVD present. No tracheal deviation present. No thyromegaly present.  Cardiovascular: Normal rate, regular rhythm and normal  heart sounds.   Pulmonary/Chest: No stridor. No respiratory distress. She has wheezes.  Abdominal: Soft. Bowel sounds are normal. She exhibits no distension and no mass. There is no tenderness. There is no rebound and no guarding.  Musculoskeletal: She exhibits no edema or tenderness.  Lymphadenopathy:    She has no cervical adenopathy.  Neurological: She displays normal reflexes. No cranial nerve deficit. She exhibits normal muscle tone. Coordination  normal.  Skin: No rash noted. No erythema.  Psychiatric: She has a normal mood and affect. Her behavior is normal. Judgment and thought content normal.    Lab Results  Component Value Date   WBC 6.4 05/06/2015   HGB 12.8 05/06/2015   HCT 38.4 05/06/2015   PLT 239.0 05/06/2015   GLUCOSE 92 05/06/2015   CHOL 165 05/06/2015   TRIG 88.0 05/06/2015   HDL 54.60 05/06/2015   LDLCALC 93 05/06/2015   ALT 18 05/06/2015   AST 26 05/06/2015   NA 142 05/06/2015   K 4.7 05/06/2015   CL 103 05/06/2015   CREATININE 1.11 05/06/2015   BUN 21 05/06/2015   CO2 30 05/06/2015   TSH 2.26 05/06/2015   INR 0.95 10/25/2011   HGBA1C 5.8 02/06/2014   MICROALBUR 0.6 03/23/2011    Dg Chest 2 View  02/10/2015  CLINICAL DATA:  Routine checkup, cough, congestion for 10 days. Chronic systolic heart failure. EXAM: CHEST  2 VIEW COMPARISON:  10/25/2011 FINDINGS: Cardiomegaly. Large hiatal hernia. Compressive atelectasis in the adjacent left lower lobe. No confluent opacities or effusions. Surgical clips in the left breast and axilla. No acute bony abnormality. IMPRESSION: Cardiomegaly.  Large hiatal hernia.  No active disease. Electronically Signed   By: Rolm Baptise M.D.   On: 02/10/2015 15:24    Assessment & Plan:   Mia Contreras was seen today for cough and wheezing.  Diagnoses and all orders for this visit:  Asthmatic bronchitis, mild intermittent, uncomplicated -     DG Chest 2 View -     methylPREDNISolone acetate (DEPO-MEDROL) injection 80 mg; Inject 1 mL (80 mg total) into the muscle once.  Essential hypertension  Chronic renal failure, stage 2 (mild)  Other orders -     levofloxacin (LEVAQUIN) 500 MG tablet; Take 1 tablet (500 mg total) by mouth daily. -     benzonatate (TESSALON) 200 MG capsule; Take 1 capsule (200 mg total) by mouth 2 (two) times daily as needed for cough. -     predniSONE (DELTASONE) 10 MG tablet; Prednisone 10 mg: take 4 tabs a day x 3 days; then 3 tabs a day x 4 days; then 2  tabs a day x 4 days, then 1 tab a day x 6 days, then stop. Take pc.  I am having Mia Contreras start on levofloxacin and predniSONE. I am also having her maintain her Calcium Carbonate-Vitamin D, aspirin, Multiple Vitamins-Minerals (MULTIVITAMIN PO), diphenoxylate-atropine, spironolactone, Fluticasone-Salmeterol, losartan, furosemide, hydrALAZINE, ALPRAZolam, carvedilol, and benzonatate. We administered methylPREDNISolone acetate.  Meds ordered this encounter  Medications  . levofloxacin (LEVAQUIN) 500 MG tablet    Sig: Take 1 tablet (500 mg total) by mouth daily.    Dispense:  10 tablet    Refill:  0  . benzonatate (TESSALON) 200 MG capsule    Sig: Take 1 capsule (200 mg total) by mouth 2 (two) times daily as needed for cough.    Dispense:  30 capsule    Refill:  1  . predniSONE (DELTASONE) 10 MG tablet    Sig: Prednisone 10  mg: take 4 tabs a day x 3 days; then 3 tabs a day x 4 days; then 2 tabs a day x 4 days, then 1 tab a day x 6 days, then stop. Take pc.    Dispense:  38 tablet    Refill:  1  . methylPREDNISolone acetate (DEPO-MEDROL) injection 80 mg    Sig:      Follow-up: Return in about 3 months (around 01/20/2016) for a follow-up visit.  Walker Kehr, MD

## 2015-10-20 NOTE — Assessment & Plan Note (Addendum)
Acute -- CXR Depomedrol IM Advair BID Prom-cod syr Zpac Prednisone 10 mg: take 4 tabs a day x 3 days; then 3 tabs a day x 4 days; then 2 tabs a day x 4 days, then 1 tab a day x 6 days, then stop. Take pc.  Potential benefits of a steroid  use as well as potential risks  and complications were explained to the patient and were aknowledged.

## 2015-10-20 NOTE — Assessment & Plan Note (Signed)
Monitor BMET. 

## 2015-10-20 NOTE — Assessment & Plan Note (Signed)
Losartan, Coreg,Spironolactone, Hydralazine

## 2015-10-20 NOTE — Progress Notes (Signed)
Pre visit review using our clinic review tool, if applicable. No additional management support is needed unless otherwise documented below in the visit note. 

## 2015-10-27 ENCOUNTER — Telehealth: Payer: Self-pay | Admitting: Internal Medicine

## 2015-10-27 NOTE — Telephone Encounter (Signed)
Pt was given antibiotics and prednisone and when she started taking her prednisone her face swelled up so she quit taking the prednisone. She still has 2 pills left of the antibiotics. She has some questions about how she's feeling and can you please give her a call back at her home number [phone]

## 2015-10-28 NOTE — Telephone Encounter (Signed)
I called pt- She states one dose of Prednisone caused facial swelling. She spoke to her pharmacist and stopped taking the Prednisone. She is still taking abx and states she is some better. I advised her to completed abx, hold Prednisone, keep her ROV with PCP as scheduled and call us sooner if needed. Pt agrees/understands.

## 2015-11-04 ENCOUNTER — Other Ambulatory Visit (INDEPENDENT_AMBULATORY_CARE_PROVIDER_SITE_OTHER): Payer: Medicare Other

## 2015-11-04 DIAGNOSIS — I10 Essential (primary) hypertension: Secondary | ICD-10-CM | POA: Diagnosis not present

## 2015-11-04 DIAGNOSIS — J4520 Mild intermittent asthma, uncomplicated: Secondary | ICD-10-CM

## 2015-11-04 DIAGNOSIS — E0520 Thyrotoxicosis with toxic multinodular goiter without thyrotoxic crisis or storm: Secondary | ICD-10-CM | POA: Diagnosis not present

## 2015-11-04 DIAGNOSIS — J449 Chronic obstructive pulmonary disease, unspecified: Secondary | ICD-10-CM | POA: Diagnosis not present

## 2015-11-04 LAB — BASIC METABOLIC PANEL
BUN: 33 mg/dL — AB (ref 6–23)
CALCIUM: 9.7 mg/dL (ref 8.4–10.5)
CO2: 33 meq/L — AB (ref 19–32)
Chloride: 102 mEq/L (ref 96–112)
Creatinine, Ser: 1.34 mg/dL — ABNORMAL HIGH (ref 0.40–1.20)
GFR: 40.02 mL/min — AB (ref >60.00)
GLUCOSE: 142 mg/dL — AB (ref 70–99)
POTASSIUM: 5.2 meq/L — AB (ref 3.5–5.1)
Sodium: 142 mEq/L (ref 135–145)

## 2015-11-04 LAB — LIPID PANEL
CHOLESTEROL: 130 mg/dL (ref 0–200)
HDL: 54.5 mg/dL (ref >39.00)
LDL Cholesterol: 60 mg/dL (ref 0–99)
NONHDL: 75.85
Total CHOL/HDL Ratio: 2
Triglycerides: 77 mg/dL (ref 0.0–149.0)
VLDL: 15.4 mg/dL (ref 0.0–40.0)

## 2015-11-04 LAB — HEPATIC FUNCTION PANEL
ALBUMIN: 4.1 g/dL (ref 3.5–5.2)
ALK PHOS: 61 U/L (ref 39–117)
ALT: 36 U/L — ABNORMAL HIGH (ref 0–35)
AST: 22 U/L (ref 0–37)
Bilirubin, Direct: 0.2 mg/dL (ref 0.0–0.3)
TOTAL PROTEIN: 6.4 g/dL (ref 6.0–8.3)
Total Bilirubin: 0.8 mg/dL (ref 0.2–1.2)

## 2015-11-04 LAB — TSH: TSH: 1.51 u[IU]/mL (ref 0.35–4.50)

## 2015-11-04 NOTE — Telephone Encounter (Signed)
D/c prednisone Thx

## 2015-11-04 NOTE — Telephone Encounter (Signed)
Pt called back and she started taking her Prednisone again on Sunday. Her legs and feet are swelling and she has not taken any today. She has taken a total of 11 pills Please give her a call back

## 2015-11-05 ENCOUNTER — Ambulatory Visit (INDEPENDENT_AMBULATORY_CARE_PROVIDER_SITE_OTHER): Payer: Medicare Other | Admitting: Nurse Practitioner

## 2015-11-05 ENCOUNTER — Encounter: Payer: Self-pay | Admitting: Nurse Practitioner

## 2015-11-05 VITALS — BP 120/64 | HR 72 | Temp 97.9°F | Ht 63.0 in | Wt 145.5 lb

## 2015-11-05 DIAGNOSIS — S81802A Unspecified open wound, left lower leg, initial encounter: Secondary | ICD-10-CM

## 2015-11-05 NOTE — Patient Instructions (Signed)
Skin Tear Care A skin tear is a wound in which the top layer of skin has peeled off. This is a common problem with aging because the skin becomes thinner and more fragile as a person gets older. In addition, some medicines, such as oral corticosteroids, can lead to skin thinning if taken for long periods of time.  A skin tear is often repaired with tape or skin adhesive strips. This keeps the skin that has been peeled off in contact with the healthier skin beneath. Depending on the location of the wound, a bandage (dressing) may be applied over the tape or skin adhesive strips. Sometimes, during the healing process, the skin turns black and dies. Even when this happens, the torn skin acts as a good dressing until the skin underneath gets healthier and repairs itself. HOME CARE INSTRUCTIONS   Change dressings once per day or as directed by your caregiver.  Gently clean the skin tear and the area around the tear using saline solution or mild soap and water.  Do not rub the injured skin dry. Let the area air dry.  Apply petroleum jelly or an antibiotic cream or ointment to keep the tear moist. This will help the wound heal. Do not allow a scab to form.  If the dressing sticks before the next dressing change, moisten it with warm soapy water and gently remove it.  Protect the injured skin until it has healed.  Only take over-the-counter or prescription medicines as directed by your caregiver.  Take showers or baths using warm soapy water. Apply a new dressing after the shower or bath.  Keep all follow-up appointments as directed by your caregiver.  SEEK IMMEDIATE MEDICAL CARE IF:   You have redness, swelling, or increasing pain in the skin tear.  You havepus coming from the skin tear.  You have chills.  You have a red streak that goes away from the skin tear.  You have a bad smell coming from the tear or dressing.  You have a fever or persistent symptoms for more than 2-3 days.  You  have a fever and your symptoms suddenly get worse. MAKE SURE YOU:  Understand these instructions.  Will watch this condition.  Will get help right away if your child is not doing well or gets worse.   This information is not intended to replace advice given to you by your health care provider. Make sure you discuss any questions you have with your health care provider.   Document Released: 05/02/2001 Document Revised: 05/01/2012 Document Reviewed: 02/19/2012 Elsevier Interactive Patient Education [employer].

## 2015-11-05 NOTE — Progress Notes (Signed)
Patient ID: Mia Contreras, female    DOB: 04/19/1929  Age: 82 y.o. MRN: [id number]  CC: Edema   HPI Mia Contreras presents for CC of fall last Sunday. Pt is accompanied by her neighbor today.   1) Pt fell on Sunday at her home after tripping into the house from the porch over a small step she reports. Her neighbor checked on her and the pt reports she cleaned it up and did not seek care.  She recently finished prednisone and levaquin Took a xanax before coming in today due to anxiety  Pt is historically a poor wound healer and has recently been to the wound care center for her right leg. Pt has been cleaning with soap and water and putting bacitracin ointment on the site.   History Mia has a past medical history of GOITER, MULTINODULAR (05/06/2010); HYPERTHYROIDISM (05/23/2010); HYPERLIPIDEMIA (08/20/2007); ANEMIA-NOS (08/20/2007); ANXIETY (03/16/2007); CARPAL TUNNEL SYNDROME, RIGHT (01/11/2008); HYPERTENSION (03/16/2007); VENOUS INSUFFICIENCY (09/05/2007); DIVERTICULOSIS, COLON (08/20/2007); CELLULITIS, LEG, RIGHT (08/20/2007); OSTEOARTHRITIS (03/16/2007); OSTEOPOROSIS (08/20/2007); SCOLIOSIS (01/11/2008); BREAST CANCER, HX OF (03/16/2007); SKIN CANCER, HX OF (11/12/2008); PREMATURE ATRIAL CONTRACTIONS (09/21/2010); Thyroid nodule; Cataract; Pneumonia (07/2011); IBS (irritable bowel syndrome); and Collagenous colitis.   She has past surgical history that includes Total knee arthroplasty; Breast lumpectomy (Left, 2001); Abdominal hysterectomy; Appendectomy (1972); Mastectomy, partial (Left); Carpal tunnel release; Total knee arthroplasty (Right, 2010); Cataract extraction; Skin cancer excision; XRT; Eye surgery; Total knee arthroplasty (10/30/2011); and Hernia repair.   Her family history includes Alcohol abuse in her father; Alzheimer's disease in her mother; Dementia in her mother; Diabetes in her other; Heart attack in her father; Heart disease in her mother; Hypertension in her mother; Mental  retardation in her mother; Prostate cancer in her brother. There is no history of Colon cancer or Anesthesia problems.She reports that she quit smoking about 34 years ago. She has never used smokeless tobacco. She reports that she drinks alcohol. She reports that she does not use illicit drugs.  Outpatient Prescriptions Prior to Visit  Medication Sig Dispense Refill  . ALPRAZolam (XANAX) 0.5 MG tablet Take 1 tablet (0.5 mg total) by mouth 2 (two) times daily as needed for anxiety or sleep. 60 tablet 3  . aspirin 325 MG tablet Take 325 mg by mouth daily.    . benzonatate (TESSALON) 200 MG capsule Take 1 capsule (200 mg total) by mouth 2 (two) times daily as needed for cough. 30 capsule 1  . Calcium Carbonate-Vitamin D (CALCIUM 600+D) 600-400 MG-UNIT per tablet Take 1 tablet by mouth daily.    . carvedilol (COREG) 12.5 MG tablet Take 1 tablet (12.5 mg total) by mouth 2 (two) times daily with a meal. 180 tablet 3  . diphenoxylate-atropine (LOMOTIL) 2.5-0.025 MG per tablet Take 1 tablet by mouth 4 (four) times daily as needed for diarrhea or loose stools. 60 tablet 0  . Fluticasone-Salmeterol (ADVAIR DISKUS) 250-50 MCG/DOSE AEPB Inhale 1 puff into the lungs 2 (two) times daily. 60 each 3  . furosemide (LASIX) 20 MG tablet Take 20 mg by mouth daily.    . hydrALAZINE (APRESOLINE) 25 MG tablet Take 25 mg by mouth 2 (two) times daily.    Marland Kitchen losartan (COZAAR) 100 MG tablet TAKE 1 TABLET BY MOUTH EVERY DAY 90 tablet 3  . Multiple Vitamins-Minerals (MULTIVITAMIN PO) Take 1 tablet by mouth daily.     Marland Kitchen spironolactone (ALDACTONE) 25 MG tablet Take 0.5 tablets (12.5 mg total) by mouth daily. 15 tablet 11  . levofloxacin (LEVAQUIN) 500  MG tablet Take 1 tablet (500 mg total) by mouth daily. 10 tablet 0  . predniSONE (DELTASONE) 10 MG tablet Prednisone 10 mg: take 4 tabs a day x 3 days; then 3 tabs a day x 4 days; then 2 tabs a day x 4 days, then 1 tab a day x 6 days, then stop. Take pc. 38 tablet 1   No  facility-administered medications prior to visit.    ROS Review of Systems  Constitutional: Negative for fever, chills, diaphoresis and fatigue.  Respiratory: Negative for chest tightness, shortness of breath and wheezing.   Cardiovascular: Negative for chest pain, palpitations and leg swelling.  Gastrointestinal: Negative for nausea, vomiting and diarrhea.  Skin: Positive for color change and wound. Negative for rash.  Neurological: Negative for dizziness, syncope, weakness, numbness and headaches.  Psychiatric/Behavioral: The patient is nervous/anxious.     Objective:  BP 120/64 mmHg  Pulse 72  Temp(Src) 97.9 F (36.6 C) (Oral)  Ht 5\' 3"  (1.6 m)  Wt 145 lb 8 oz (65.998 kg)  BMI 25.78 kg/m2  SpO2 97%  Physical Exam  Constitutional: She is oriented to person, place, and time. She appears well-developed and well-nourished. No distress.  HENT:  Head: Normocephalic and atraumatic.  Right Ear: External ear normal.  Left Ear: External ear normal.  Cardiovascular: Normal rate and regular rhythm.   Pulmonary/Chest: Effort normal and breath sounds normal.  Neurological: She is alert and oriented to person, place, and time. She exhibits normal muscle tone. Coordination normal.  Skin: Skin is warm and dry. She is not diaphoretic.     Skin tear- not a clean tear- has tears within the tear, rolled edges, ecchymosis that blanches of lower left leg. No sign of infection, granulation tissue seen. Long 3.5 inches x 2 inches wide approx  Psychiatric: She has a normal mood and affect. Her behavior is normal. Judgment and thought content normal.   Assessment & Plan:   Mia Contreras was seen today for edema.  Diagnoses and all orders for this visit:  Leg wound, left, initial encounter -     AMB referral to wound care center   I have discontinued Ms. Contreras's levofloxacin and predniSONE. I am also having her maintain her Calcium Carbonate-Vitamin D, aspirin, Multiple Vitamins-Minerals  (MULTIVITAMIN PO), diphenoxylate-atropine, spironolactone, Fluticasone-Salmeterol, losartan, furosemide, hydrALAZINE, ALPRAZolam, carvedilol, and benzonatate.  No orders of the defined types were placed in this encounter.     Follow-up: Return if symptoms worsen or fail to improve.

## 2015-11-05 NOTE — Telephone Encounter (Signed)
Notified pt with MD response../lmb 

## 2015-11-08 ENCOUNTER — Telehealth: Payer: Self-pay | Admitting: Internal Medicine

## 2015-11-08 DIAGNOSIS — S81809A Unspecified open wound, unspecified lower leg, initial encounter: Secondary | ICD-10-CM | POA: Insufficient documentation

## 2015-11-08 NOTE — Assessment & Plan Note (Addendum)
New onset  9 week old skin tear and ecchymosis of left lower extremity  Edges are rolled and she is a poor healer- will send for wound care at [facility]. She has been there for the right leg recently.  Irrigated, telfa, and coban to hold in place   -Remove in 24 hrs  FU w/ her PCP on Wednesday.

## 2015-11-10 ENCOUNTER — Encounter: Payer: Self-pay | Admitting: Internal Medicine

## 2015-11-10 ENCOUNTER — Ambulatory Visit (INDEPENDENT_AMBULATORY_CARE_PROVIDER_SITE_OTHER): Payer: Medicare Other | Admitting: Internal Medicine

## 2015-11-10 VITALS — BP 110/60 | HR 74 | Temp 97.4°F | Wt 146.0 lb

## 2015-11-10 DIAGNOSIS — S81002D Unspecified open wound, left knee, subsequent encounter: Secondary | ICD-10-CM | POA: Diagnosis not present

## 2015-11-10 DIAGNOSIS — N183 Chronic kidney disease, stage 3 unspecified: Secondary | ICD-10-CM

## 2015-11-10 DIAGNOSIS — S91002D Unspecified open wound, left ankle, subsequent encounter: Secondary | ICD-10-CM | POA: Diagnosis not present

## 2015-11-10 DIAGNOSIS — J4531 Mild persistent asthma with (acute) exacerbation: Secondary | ICD-10-CM

## 2015-11-10 DIAGNOSIS — Z23 Encounter for immunization: Secondary | ICD-10-CM | POA: Diagnosis not present

## 2015-11-10 DIAGNOSIS — S81802D Unspecified open wound, left lower leg, subsequent encounter: Secondary | ICD-10-CM | POA: Diagnosis not present

## 2015-11-10 MED ORDER — ALPRAZOLAM 0.5 MG PO TABS: 0.5000 mg | ORAL_TABLET | Freq: Two times a day (BID) | ORAL | Status: DC | PRN

## 2015-11-10 MED ORDER — CARVEDILOL 12.5 MG PO TABS: 12.5000 mg | ORAL_TABLET | Freq: Two times a day (BID) | ORAL | Status: DC

## 2015-11-10 MED ORDER — FUROSEMIDE 20 MG PO TABS: 20.0000 mg | ORAL_TABLET | Freq: Every day | ORAL | Status: DC

## 2015-11-10 NOTE — Progress Notes (Signed)
Subjective:  Patient ID: Mia Contreras, female    DOB: [birth year]  Age: 82 y.o. MRN: [id number]  CC: No chief complaint on file.   HPI Mia Contreras presents for LLE wound. F/u bronchitis - better. F/u HTN, CRI.  Pt fell on Sunday a week ago at her home after tripping into the house from the porch over a small step she reports. Her neighbor checked on her and the pt reports she cleaned it up and did not seek care.   Outpatient Prescriptions Prior to Visit  Medication Sig Dispense Refill  . aspirin 325 MG tablet Take 325 mg by mouth daily.    . benzonatate (TESSALON) 200 MG capsule Take 1 capsule (200 mg total) by mouth 2 (two) times daily as needed for cough. 30 capsule 1  . Calcium Carbonate-Vitamin D (CALCIUM 600+D) 600-400 MG-UNIT per tablet Take 1 tablet by mouth daily.    . diphenoxylate-atropine (LOMOTIL) 2.5-0.025 MG per tablet Take 1 tablet by mouth 4 (four) times daily as needed for diarrhea or loose stools. 60 tablet 0  . Fluticasone-Salmeterol (ADVAIR DISKUS) 250-50 MCG/DOSE AEPB Inhale 1 puff into the lungs 2 (two) times daily. 60 each 3  . hydrALAZINE (APRESOLINE) 25 MG tablet Take 25 mg by mouth 2 (two) times daily.    Marland Kitchen losartan (COZAAR) 100 MG tablet TAKE 1 TABLET BY MOUTH EVERY DAY 90 tablet 3  . Multiple Vitamins-Minerals (MULTIVITAMIN PO) Take 1 tablet by mouth daily.     Marland Kitchen spironolactone (ALDACTONE) 25 MG tablet Take 0.5 tablets (12.5 mg total) by mouth daily. 15 tablet 11  . ALPRAZolam (XANAX) 0.5 MG tablet Take 1 tablet (0.5 mg total) by mouth 2 (two) times daily as needed for anxiety or sleep. 60 tablet 3  . carvedilol (COREG) 12.5 MG tablet Take 1 tablet (12.5 mg total) by mouth 2 (two) times daily with a meal. 180 tablet 3  . furosemide (LASIX) 20 MG tablet Take 20 mg by mouth daily.     No facility-administered medications prior to visit.    ROS Review of Systems  Constitutional: Negative for chills, activity change, appetite change, fatigue and  unexpected weight change.  HENT: Negative for congestion, mouth sores and sinus pressure.   Eyes: Negative for visual disturbance.  Respiratory: Negative for cough and chest tightness.   Gastrointestinal: Negative for nausea and abdominal pain.  Genitourinary: Negative for urgency, frequency, difficulty urinating and vaginal pain.  Musculoskeletal: Negative for back pain and gait problem.  Skin: Positive for wound. Negative for pallor and rash.  Neurological: Negative for dizziness, tremors, weakness, numbness and headaches.  Psychiatric/Behavioral: Negative for suicidal ideas, confusion and sleep disturbance.    Objective:  BP 110/60 mmHg  Pulse 74  Temp(Src) 97.4 F (36.3 C) (Oral)  Wt 146 lb (66.225 kg)  SpO2 91%  BP Readings from Last 3 Encounters:  11/10/15 110/60  11/05/15 120/64  10/20/15 120/70    Wt Readings from Last 3 Encounters:  11/10/15 146 lb (66.225 kg)  11/05/15 145 lb 8 oz (65.998 kg)  10/20/15 144 lb (65.318 kg)    Physical Exam  Constitutional: She appears well-developed. No distress.  HENT:  Head: Normocephalic.  Right Ear: External ear normal.  Left Ear: External ear normal.  Nose: Nose normal.  Mouth/Throat: Oropharynx is clear and moist.  Eyes: Conjunctivae are normal. Pupils are equal, round, and reactive to light. Right eye exhibits no discharge. Left eye exhibits no discharge.  Neck: Normal range of motion. Neck supple.  No JVD present. No tracheal deviation present. No thyromegaly present.  Cardiovascular: Normal rate, regular rhythm and normal heart sounds.   Pulmonary/Chest: No stridor. No respiratory distress. She has no wheezes.  Abdominal: Soft. Bowel sounds are normal. She exhibits no distension and no mass. There is no tenderness. There is no rebound and no guarding.  Musculoskeletal: She exhibits edema and tenderness.  Lymphadenopathy:    She has no cervical adenopathy.  Neurological: She displays normal reflexes. No cranial nerve  deficit. She exhibits normal muscle tone. Coordination normal.  Skin: No rash noted. There is erythema.  Psychiatric: She has a normal mood and affect. Her behavior is normal. Judgment and thought content normal.  LLE w/1+edema and a skin wound 15x2 cm RLE w/trace edema  Lab Results  Component Value Date   WBC 6.4 05/06/2015   HGB 12.8 05/06/2015   HCT 38.4 05/06/2015   PLT 239.0 05/06/2015   GLUCOSE 142* 11/04/2015   CHOL 130 11/04/2015   TRIG 77.0 11/04/2015   HDL 54.50 11/04/2015   LDLCALC 60 11/04/2015   ALT 36* 11/04/2015   AST 22 11/04/2015   NA 142 11/04/2015   K 5.2* 11/04/2015   CL 102 11/04/2015   CREATININE 1.34* 11/04/2015   BUN 33* 11/04/2015   CO2 33* 11/04/2015   TSH 1.51 11/04/2015   INR 0.95 10/25/2011   HGBA1C 5.8 02/06/2014   MICROALBUR 0.6 03/23/2011    Dg Chest 2 View  10/20/2015  CLINICAL DATA:  Cough, chest congestion, wheezing, and shortness of breath for the past week; history of COPD, former smoker. EXAM: CHEST  2 VIEW COMPARISON:  PA and lateral chest x-ray of February 10, 2015 FINDINGS: The lungs are hyperinflated with mild hemidiaphragm flattening. The interstitial markings are coarse. There is no alveolar infiltrate. There is a small left pleural effusion. There is a large hiatal hernia -partially intra thoracic stomach. The mediastinum is normal in width. There is calcification in the wall of the ascending and descending thoracic aorta. There surgical clips in the left breast and left axillary region. There is multilevel degenerative disc disease of the thoracic spine. IMPRESSION: 1. Chronic bronchitic changes with slight interval increase in the pulmonary interstitial markings. There is no alveolar pneumonia. 2. Tiny left pleural effusion 3. Stable enlargement of the cardiac silhouette without pulmonary edema. 4. Large hiatal hernia -partially intra thoracic stomach. Electronically Signed   By: David  Martinique M.D.   On: 10/20/2015 09:59    Assessment &  Plan:   Diagnoses and all orders for this visit:  Open wound of knee, leg, and ankle, left, subsequent encounter  Asthmatic bronchitis, mild persistent, with acute exacerbation  Chronic renal failure, stage 3 (moderate) -     Basic metabolic panel; Future  Need for pneumococcal vaccination -     Pneumococcal polysaccharide vaccine 23-valent greater than or equal to 2yo subcutaneous/IM  Other orders -     ALPRAZolam (XANAX) 0.5 MG tablet; Take 1 tablet (0.5 mg total) by mouth 2 (two) times daily as needed for anxiety or sleep. -     carvedilol (COREG) 12.5 MG tablet; Take 1 tablet (12.5 mg total) by mouth 2 (two) times daily with a meal. -     furosemide (LASIX) 20 MG tablet; Take 1 tablet (20 mg total) by mouth daily.  I have changed Ms. Somerville's furosemide. I am also having her maintain her Calcium Carbonate-Vitamin D, aspirin, Multiple Vitamins-Minerals (MULTIVITAMIN PO), diphenoxylate-atropine, spironolactone, Fluticasone-Salmeterol, losartan, hydrALAZINE, benzonatate, ALPRAZolam, and carvedilol.  Meds  ordered this encounter  Medications  . ALPRAZolam (XANAX) 0.5 MG tablet    Sig: Take 1 tablet (0.5 mg total) by mouth 2 (two) times daily as needed for anxiety or sleep.    Dispense:  60 tablet    Refill:  3  . carvedilol (COREG) 12.5 MG tablet    Sig: Take 1 tablet (12.5 mg total) by mouth 2 (two) times daily with a meal.    Dispense:  180 tablet    Refill:  3  . furosemide (LASIX) 20 MG tablet    Sig: Take 1 tablet (20 mg total) by mouth daily.    Dispense:  30 tablet    Refill:  5     Follow-up: Return in about 4 weeks (around 12/08/2015) for a follow-up visit.  Walker Kehr, MD

## 2015-11-10 NOTE — Assessment & Plan Note (Signed)
Advair 

## 2015-11-10 NOTE — Assessment & Plan Note (Signed)
Monitoring labs 

## 2015-11-10 NOTE — Progress Notes (Signed)
Pre visit review using our clinic review tool, if applicable. No additional management support is needed unless otherwise documented below in the visit note. 

## 2015-11-10 NOTE — Assessment & Plan Note (Signed)
Wound clinic appt was made Topical care

## 2015-11-13 ENCOUNTER — Other Ambulatory Visit: Payer: Self-pay | Admitting: Cardiovascular Disease

## 2015-11-18 ENCOUNTER — Telehealth: Payer: Self-pay | Admitting: Internal Medicine

## 2015-11-18 DIAGNOSIS — Z96653 Presence of artificial knee joint, bilateral: Secondary | ICD-10-CM | POA: Diagnosis not present

## 2015-11-18 NOTE — Telephone Encounter (Signed)
Pt has called back and she seen her orthopedic today. First of all her knees are in place. The PA there cleaned her wound out, put her back on antibiotic, Levofloxacin 500 mg for 15 days and increased her furosimide. She wrapped her wound and told her to leave it wrapped and not get wet until she sees her wound doctor and they gave her a note to take a month off work

## 2015-11-18 NOTE — Telephone Encounter (Signed)
Pt was seen a couple weeks ago regarding a wound she has.  She has an appt with a wound doctor on Monday. She states both legs, ankle, and feet are swollen and there is a lot of drainage coming from the wound. She wants to know if she should come in to be seen here or if it can wait till Monday when she sees the wound doctor  Please call her on her cell [phone]

## 2015-11-19 NOTE — Telephone Encounter (Signed)
Noted. Thx.

## 2015-11-22 ENCOUNTER — Encounter (HOSPITAL_BASED_OUTPATIENT_CLINIC_OR_DEPARTMENT_OTHER): Payer: Medicare Other | Attending: Internal Medicine

## 2015-11-22 DIAGNOSIS — I129 Hypertensive chronic kidney disease with stage 1 through stage 4 chronic kidney disease, or unspecified chronic kidney disease: Secondary | ICD-10-CM | POA: Diagnosis not present

## 2015-11-22 DIAGNOSIS — N183 Chronic kidney disease, stage 3 (moderate): Secondary | ICD-10-CM | POA: Diagnosis not present

## 2015-11-22 DIAGNOSIS — S81812A Laceration without foreign body, left lower leg, initial encounter: Secondary | ICD-10-CM | POA: Insufficient documentation

## 2015-11-22 DIAGNOSIS — W19XXXA Unspecified fall, initial encounter: Secondary | ICD-10-CM | POA: Insufficient documentation

## 2015-11-22 DIAGNOSIS — J449 Chronic obstructive pulmonary disease, unspecified: Secondary | ICD-10-CM | POA: Diagnosis not present

## 2015-11-22 DIAGNOSIS — S81802A Unspecified open wound, left lower leg, initial encounter: Secondary | ICD-10-CM | POA: Diagnosis not present

## 2015-11-22 DIAGNOSIS — I87321 Chronic venous hypertension (idiopathic) with inflammation of right lower extremity: Secondary | ICD-10-CM | POA: Diagnosis not present

## 2015-11-22 DIAGNOSIS — Z923 Personal history of irradiation: Secondary | ICD-10-CM | POA: Insufficient documentation

## 2015-11-22 DIAGNOSIS — Z9221 Personal history of antineoplastic chemotherapy: Secondary | ICD-10-CM | POA: Insufficient documentation

## 2015-11-24 ENCOUNTER — Telehealth: Payer: Self-pay | Admitting: Cardiovascular Disease

## 2015-11-24 DIAGNOSIS — Z79899 Other long term (current) drug therapy: Secondary | ICD-10-CM

## 2015-11-24 MED ORDER — FUROSEMIDE 20 MG PO TABS: 20.0000 mg | ORAL_TABLET | Freq: Two times a day (BID) | ORAL | Status: DC

## 2015-11-24 NOTE — Telephone Encounter (Signed)
Called patient back. Patient complaining of swelling in BLE. Patient had a fall 3 weeks ago, and has a wound on the LLE. Patient is seeing a wound doctor once a week and seeing her PCP often. Patient was on prednisone which she stopped taking due to swelling. Patient was also on antibiotic for her wound.  Patient thinks she might need to take more furosemide, but patient is due for a repeat BMET, due to her elevated BUN and creatinine on 11/04/15. Encouraged patient to keep her legs elevated as much as possible, and she should be on a low salt diet. Patient has history of CHF, venous insufficiency, and low EF. Patient stated her BP and HR are good. Patient stated she just wants Dr. Johnsie Cancel to know and advise on the situation.

## 2015-11-24 NOTE — Telephone Encounter (Signed)
Pt c/o swelling: STAT is pt has developed SOB within 24 hours  1. How long have you been experiencing swelling? Pt fell 3 weeks ago, also started Prednisone, swelling started within a few days, told to stop   2. Where is the swelling located?  swelling above both knee's  3.  Are you currently taking a "fluid pill"? yes, Furosimide 20mg  1qd  4.  Are you currently SOB? Yes, has had for a long time-has COPD , worse since fall  5.  Have you traveled recently?no  Wound doc states healing won't take place until swelling goes down-left leg wrapped  [phone]

## 2015-11-24 NOTE — Telephone Encounter (Signed)
Called patient with Dr. Kyla Balzarine new orders. Sent in new prescription to patient's pharmacy, and she will come in on Monday for BMET. Patient verbalized understanding.

## 2015-11-24 NOTE — Telephone Encounter (Signed)
Can increase lasix to 20 bid continue aldactone have her get BMET Monday

## 2015-11-26 ENCOUNTER — Telehealth: Payer: Self-pay | Admitting: Cardiovascular Disease

## 2015-11-26 NOTE — Telephone Encounter (Signed)
NEW MESSAGE  Pt c/o swelling: STAT is pt has developed SOB within 24 hours  1. How long have you been experiencing swelling? 3 weeks  2. Where is the swelling located? Both legs f 3.  Are you currently taking a "fluid pill"? Yes she doubled up    Furoscmide  4.  Are you currently SOB? no  5.  Have you traveled recently? no

## 2015-11-26 NOTE — Telephone Encounter (Signed)
Called patient back about her swelling in her legs. Patient stated she has not seen a decrease in swelling, but has not seen an increase either. Patient stated she just started yesterday with increased dose of Lasix. Encouraged patient to give the Lasix a couple of days to work, and wait for lab work on Monday to see what they show. Patient stated she has called all her doctors about this situation, and will see wound doctor on Monday as well. Encouraged patient to give medication time to work. Patient verbalized understanding.

## 2015-11-29 ENCOUNTER — Other Ambulatory Visit (INDEPENDENT_AMBULATORY_CARE_PROVIDER_SITE_OTHER): Payer: Medicare Other | Admitting: *Deleted

## 2015-11-29 DIAGNOSIS — Z79899 Other long term (current) drug therapy: Secondary | ICD-10-CM | POA: Diagnosis not present

## 2015-11-29 DIAGNOSIS — I87321 Chronic venous hypertension (idiopathic) with inflammation of right lower extremity: Secondary | ICD-10-CM | POA: Diagnosis not present

## 2015-11-29 DIAGNOSIS — S81802A Unspecified open wound, left lower leg, initial encounter: Secondary | ICD-10-CM | POA: Diagnosis not present

## 2015-11-29 DIAGNOSIS — S81812A Laceration without foreign body, left lower leg, initial encounter: Secondary | ICD-10-CM | POA: Diagnosis not present

## 2015-11-29 DIAGNOSIS — J449 Chronic obstructive pulmonary disease, unspecified: Secondary | ICD-10-CM | POA: Diagnosis not present

## 2015-11-29 DIAGNOSIS — I129 Hypertensive chronic kidney disease with stage 1 through stage 4 chronic kidney disease, or unspecified chronic kidney disease: Secondary | ICD-10-CM | POA: Diagnosis not present

## 2015-11-29 DIAGNOSIS — Z9221 Personal history of antineoplastic chemotherapy: Secondary | ICD-10-CM | POA: Diagnosis not present

## 2015-11-29 DIAGNOSIS — N183 Chronic kidney disease, stage 3 (moderate): Secondary | ICD-10-CM | POA: Diagnosis not present

## 2015-11-29 LAB — BASIC METABOLIC PANEL
BUN: 52 mg/dL — AB (ref 7–25)
CHLORIDE: 100 mmol/L (ref 98–110)
CO2: 28 mmol/L (ref 20–31)
CREATININE: 2.14 mg/dL — AB (ref 0.60–0.88)
Calcium: 10.8 mg/dL — ABNORMAL HIGH (ref 8.6–10.4)
GLUCOSE: 126 mg/dL — AB (ref 65–99)
POTASSIUM: 5.6 mmol/L — AB (ref 3.5–5.3)
Sodium: 140 mmol/L (ref 135–146)

## 2015-11-30 ENCOUNTER — Telehealth: Payer: Self-pay | Admitting: Internal Medicine

## 2015-11-30 NOTE — Telephone Encounter (Signed)
Per PCP he needs to see any provider. I called pt- she states Mia Contreras., front office team leader has called patient and scheduled OV with Mia Contreras for 12/01/15 @ 9:15am. She will keep OV with Mia Contreras as scheduled or go to UC or ER if needed.

## 2015-11-30 NOTE — Telephone Encounter (Signed)
Dr Alford Highland , wound Dr told her that she needs to be seen today. Her legs are swollen and she said that she was to come in as soon as possible

## 2015-12-01 ENCOUNTER — Other Ambulatory Visit: Payer: Medicare Other

## 2015-12-01 ENCOUNTER — Ambulatory Visit (INDEPENDENT_AMBULATORY_CARE_PROVIDER_SITE_OTHER): Payer: Medicare Other | Admitting: Nurse Practitioner

## 2015-12-01 ENCOUNTER — Encounter: Payer: Self-pay | Admitting: Nurse Practitioner

## 2015-12-01 ENCOUNTER — Other Ambulatory Visit (INDEPENDENT_AMBULATORY_CARE_PROVIDER_SITE_OTHER): Payer: Medicare Other

## 2015-12-01 VITALS — BP 98/70 | HR 88 | Temp 97.4°F | Ht 63.0 in | Wt 153.8 lb

## 2015-12-01 DIAGNOSIS — I5022 Chronic systolic (congestive) heart failure: Secondary | ICD-10-CM

## 2015-12-01 DIAGNOSIS — A09 Infectious gastroenteritis and colitis, unspecified: Secondary | ICD-10-CM

## 2015-12-01 DIAGNOSIS — R197 Diarrhea, unspecified: Secondary | ICD-10-CM

## 2015-12-01 DIAGNOSIS — S81802A Unspecified open wound, left lower leg, initial encounter: Secondary | ICD-10-CM | POA: Diagnosis not present

## 2015-12-01 LAB — CBC WITH DIFFERENTIAL/PLATELET
BASOS ABS: 0.2 10*3/uL — AB (ref 0.0–0.1)
Basophils Relative: 1.5 % (ref 0.0–3.0)
EOS ABS: 0.1 10*3/uL (ref 0.0–0.7)
Eosinophils Relative: 0.5 % (ref 0.0–5.0)
HCT: 33.8 % — ABNORMAL LOW (ref 36.0–46.0)
Hemoglobin: 11 g/dL — ABNORMAL LOW (ref 12.0–15.0)
LYMPHS ABS: 1.1 10*3/uL (ref 0.7–4.0)
Lymphocytes Relative: 11.2 % — ABNORMAL LOW (ref 12.0–46.0)
MCHC: 32.5 g/dL (ref 30.0–36.0)
MCV: 86.9 fl (ref 78.0–100.0)
Monocytes Absolute: 1 10*3/uL (ref 0.1–1.0)
Monocytes Relative: 9.6 % (ref 3.0–12.0)
NEUTROS ABS: 7.9 10*3/uL — AB (ref 1.4–7.7)
NEUTROS PCT: 77.2 % — AB (ref 43.0–77.0)
PLATELETS: 310 10*3/uL (ref 150.0–400.0)
RBC: 3.89 Mil/uL (ref 3.87–5.11)
RDW: 15.5 % (ref 11.5–15.5)
WBC: 10.2 10*3/uL (ref 4.0–10.5)

## 2015-12-01 LAB — BRAIN NATRIURETIC PEPTIDE: Pro B Natriuretic peptide (BNP): 1090 pg/mL — ABNORMAL HIGH (ref 0.0–100.0)

## 2015-12-01 LAB — COMPREHENSIVE METABOLIC PANEL
ALT: 30 U/L (ref 0–35)
AST: 24 U/L (ref 0–37)
Albumin: 3.8 g/dL (ref 3.5–5.2)
Alkaline Phosphatase: 58 U/L (ref 39–117)
BILIRUBIN TOTAL: 0.7 mg/dL (ref 0.2–1.2)
BUN: 54 mg/dL — ABNORMAL HIGH (ref 6–23)
CHLORIDE: 100 meq/L (ref 96–112)
CO2: 30 meq/L (ref 19–32)
CREATININE: 2.47 mg/dL — AB (ref 0.40–1.20)
Calcium: 10.7 mg/dL — ABNORMAL HIGH (ref 8.4–10.5)
GFR: 19.75 mL/min — AB (ref >60.00)
GLUCOSE: 104 mg/dL — AB (ref 70–99)
Potassium: 5 mEq/L (ref 3.5–5.1)
Sodium: 140 mEq/L (ref 135–145)
Total Protein: 6.4 g/dL (ref 6.0–8.3)

## 2015-12-01 MED ORDER — TORSEMIDE 20 MG PO TABS: ORAL_TABLET | ORAL | Status: DC

## 2015-12-01 NOTE — Patient Instructions (Addendum)
Please visit the lab today-   Stop the Lasix and start the Torsemide 1-2 tablets daily depending on swelling  Cut the Aldactone in half  Wear pants and socks to keep the skin protected and it may weep. Keep legs elevated as much as possible.  Follow up with wound care on Monday

## 2015-12-01 NOTE — Progress Notes (Signed)
Pre visit review using our clinic review tool, if applicable. No additional management support is needed unless otherwise documented below in the visit note. 

## 2015-12-01 NOTE — Progress Notes (Signed)
Patient ID: Mia Contreras, female    DOB: 06-30-1929  Age: 82 y.o. MRN: [id number]  CC: Leg Swelling   HPI Mia Contreras presents for CC of bilateral leg swelling, right leg now of concern.   1) Pt is being seen by wound center Pt took prednisone and antibiotic in early March  She has had a fall in mid March and saw myself- referred to wound center.  Saw Dr. Alain Marion end of March and topical care was advised  Saw Ortho and Levo 500 mg x 15 (only took 4 days) days was prescribed as well as an increase in her furosimide it was    Currently:  Diarrhea x 10 days 3 x daily watery   Denies foul odor and reports it is brown   No appetite  Feels nauseated and bloated Bilateral leg swelling Left is wrapped  Right leg has a new blister and is tight and swollen Left thigh swollen currently    History Serah has a past medical history of GOITER, MULTINODULAR (05/06/2010); HYPERTHYROIDISM (05/23/2010); HYPERLIPIDEMIA (08/20/2007); ANEMIA-NOS (08/20/2007); ANXIETY (03/16/2007); CARPAL TUNNEL SYNDROME, RIGHT (01/11/2008); HYPERTENSION (03/16/2007); VENOUS INSUFFICIENCY (09/05/2007); DIVERTICULOSIS, COLON (08/20/2007); CELLULITIS, LEG, RIGHT (08/20/2007); OSTEOARTHRITIS (03/16/2007); OSTEOPOROSIS (08/20/2007); SCOLIOSIS (01/11/2008); BREAST CANCER, HX OF (03/16/2007); SKIN CANCER, HX OF (11/12/2008); PREMATURE ATRIAL CONTRACTIONS (09/21/2010); Thyroid nodule; Cataract; Pneumonia (07/2011); IBS (irritable bowel syndrome); and Collagenous colitis.   She has past surgical history that includes Total knee arthroplasty; Breast lumpectomy (Left, 2001); Abdominal hysterectomy; Appendectomy (1972); Mastectomy, partial (Left); Carpal tunnel release; Total knee arthroplasty (Right, 2010); Cataract extraction; Skin cancer excision; XRT; Eye surgery; Total knee arthroplasty (10/30/2011); and Hernia repair.   Her family history includes Alcohol abuse in her father; Alzheimer's disease in her mother; Dementia in her mother;  Diabetes in her other; Heart attack in her father; Heart disease in her mother; Hypertension in her mother; Mental retardation in her mother; Prostate cancer in her brother. There is no history of Colon cancer or Anesthesia problems.She reports that she quit smoking about 34 years ago. She has never used smokeless tobacco. She reports that she drinks alcohol. She reports that she does not use illicit drugs.  Outpatient Prescriptions Prior to Visit  Medication Sig Dispense Refill  . ALPRAZolam (XANAX) 0.5 MG tablet Take 1 tablet (0.5 mg total) by mouth 2 (two) times daily as needed for anxiety or sleep. 60 tablet 3  . aspirin 325 MG tablet Take 325 mg by mouth daily.    . benzonatate (TESSALON) 200 MG capsule Take 1 capsule (200 mg total) by mouth 2 (two) times daily as needed for cough. 30 capsule 1  . Calcium Carbonate-Vitamin D (CALCIUM 600+D) 600-400 MG-UNIT per tablet Take 1 tablet by mouth daily.    . carvedilol (COREG) 12.5 MG tablet Take 1 tablet (12.5 mg total) by mouth 2 (two) times daily with a meal. 180 tablet 3  . diphenoxylate-atropine (LOMOTIL) 2.5-0.025 MG per tablet Take 1 tablet by mouth 4 (four) times daily as needed for diarrhea or loose stools. 60 tablet 0  . Fluticasone-Salmeterol (ADVAIR DISKUS) 250-50 MCG/DOSE AEPB Inhale 1 puff into the lungs 2 (two) times daily. 60 each 3  . hydrALAZINE (APRESOLINE) 25 MG tablet Take 25 mg by mouth 2 (two) times daily.    Marland Kitchen losartan (COZAAR) 100 MG tablet TAKE 1 TABLET BY MOUTH EVERY DAY 90 tablet 3  . Multiple Vitamins-Minerals (MULTIVITAMIN PO) Take 1 tablet by mouth daily.     Marland Kitchen spironolactone (ALDACTONE) 25 MG tablet TAKE  0.5 TABLETS (12.5 MG TOTAL) BY MOUTH DAILY. 15 tablet 8  . furosemide (LASIX) 20 MG tablet Take 1 tablet (20 mg total) by mouth 2 (two) times daily. 60 tablet 11   No facility-administered medications prior to visit.    ROS Review of Systems  Constitutional: Positive for activity change, appetite change and  fatigue. Negative for fever, chills, diaphoresis and unexpected weight change.  Respiratory: Positive for shortness of breath. Negative for cough.   Cardiovascular: Positive for leg swelling. Negative for chest pain and palpitations.  Gastrointestinal: Positive for nausea, abdominal pain, diarrhea and abdominal distention. Negative for vomiting, constipation, blood in stool, anal bleeding and rectal pain.  Skin: Positive for color change and wound. Negative for pallor and rash.    Objective:  BP 98/70 mmHg  Pulse 88  Temp(Src) 97.4 F (36.3 C) (Oral)  Ht '5\' 3"'$  (1.6 m)  Wt 153 lb 12 oz (69.741 kg)  BMI 27.24 kg/m2  SpO2 91%  Physical Exam  Constitutional: She is oriented to person, place, and time. She appears well-developed and well-nourished. No distress.  HENT:  Head: Normocephalic and atraumatic.  Right Ear: External ear normal.  Left Ear: External ear normal.  Eyes: No scleral icterus.  Cardiovascular: Normal rate, regular rhythm and normal heart sounds.   Pulmonary/Chest: Effort normal. No respiratory distress. She has no wheezes. She has no rales. She exhibits no tenderness.  Decreased breath sounds allover  Abdominal: Soft. Bowel sounds are normal. She exhibits no distension and no mass. There is tenderness. There is no rebound and no guarding.  Generalized tenderness  Musculoskeletal: She exhibits edema and tenderness.  Left entire leg swelling- wrapped currently on lower portion Right leg- non-pitting edema, no weeping, shiny,tight, small blister intact on right lateral LE  Neurological: She is alert and oriented to person, place, and time. Coordination abnormal.  Using rolling walker  Skin: Skin is warm and dry. No rash noted. She is not diaphoretic.  Psychiatric: She has a normal mood and affect. Her behavior is normal. Judgment and thought content normal.   Assessment & Plan:   Anhar was seen today for leg swelling.  Diagnoses and all orders for this  visit:  Diarrhea of presumed infectious origin -     Clostridium Difficile by PCR; Future -     CBC with Differential/Platelet; Future -     Comp Met (CMET); Future  Chronic systolic heart failure (HCC) -     B Nat Peptide; Future  Leg wound, left, initial encounter  Other orders -     torsemide (DEMADEX) 20 MG tablet; Take 1-2 tablets daily by mouth daily   I have discontinued Ms. Oberhaus's furosemide. I am also having her start on torsemide. Additionally, I am having her maintain her Calcium Carbonate-Vitamin D, aspirin, Multiple Vitamins-Minerals (MULTIVITAMIN PO), diphenoxylate-atropine, Fluticasone-Salmeterol, losartan, hydrALAZINE, benzonatate, ALPRAZolam, carvedilol, and spironolactone.  Meds ordered this encounter  Medications  . torsemide (DEMADEX) 20 MG tablet    Sig: Take 1-2 tablets daily by mouth daily    Dispense:  30 tablet    Refill:  0    Order Specific Question:  Supervising Provider    Answer:  Crecencio Mc [[id number]]     Follow-up: Return if symptoms worsen or fail to improve.

## 2015-12-02 LAB — CLOSTRIDIUM DIFFICILE BY PCR: CDIFFPCR: NOT DETECTED

## 2015-12-05 NOTE — Assessment & Plan Note (Signed)
Pt able to give stool sample Recent use of Levaquin concerned for c. Diff Changing Lasix to [employer]

## 2015-12-05 NOTE — Assessment & Plan Note (Signed)
Seeing Wound center Discussed with Dr. Alain Marion- he also saw pt and advised to switch from lasix to demadex, checking labs today  Halved spironolactone daily  Protect legs from further trauma due to unsteady gait FU on Monday with Wound center Seek emergency care if feeling dry, worsening SOB

## 2015-12-05 NOTE — Assessment & Plan Note (Signed)
Checking CMET and BNP today Per consultation with PCP switching from furosemide to torsemide

## 2015-12-06 ENCOUNTER — Telehealth: Payer: Self-pay

## 2015-12-06 DIAGNOSIS — S81812A Laceration without foreign body, left lower leg, initial encounter: Secondary | ICD-10-CM | POA: Diagnosis not present

## 2015-12-06 DIAGNOSIS — N183 Chronic kidney disease, stage 3 (moderate): Secondary | ICD-10-CM | POA: Diagnosis not present

## 2015-12-06 DIAGNOSIS — I129 Hypertensive chronic kidney disease with stage 1 through stage 4 chronic kidney disease, or unspecified chronic kidney disease: Secondary | ICD-10-CM | POA: Diagnosis not present

## 2015-12-06 DIAGNOSIS — Z79899 Other long term (current) drug therapy: Secondary | ICD-10-CM

## 2015-12-06 DIAGNOSIS — Z9221 Personal history of antineoplastic chemotherapy: Secondary | ICD-10-CM | POA: Diagnosis not present

## 2015-12-06 DIAGNOSIS — J449 Chronic obstructive pulmonary disease, unspecified: Secondary | ICD-10-CM | POA: Diagnosis not present

## 2015-12-06 DIAGNOSIS — I87321 Chronic venous hypertension (idiopathic) with inflammation of right lower extremity: Secondary | ICD-10-CM | POA: Diagnosis not present

## 2015-12-06 DIAGNOSIS — S81802A Unspecified open wound, left lower leg, initial encounter: Secondary | ICD-10-CM | POA: Diagnosis not present

## 2015-12-06 NOTE — Telephone Encounter (Signed)
-----   Message from Josue Hector, MD sent at 12/04/2015 10:58 PM EDT ----- K/BUN/Cr up stop aldactone and recheck in 4 weeks

## 2015-12-06 NOTE — Telephone Encounter (Signed)
Called patient about her lab results. Per Dr. Johnsie Cancel, K/BUN/Cr up stop aldactone and recheck in 4 weeks. Patient verbalized understanding. Patient has appointment this week with Dr. Johnsie Cancel on 12/10/15. Patient also stated her BP has been running low and she has cut back on taking her Hydralazine to once daily. Encouraged patient to check BP in the morning before she takes hydralazine, and if SBP is below 100 to hold it for that morning. Patient is still seeing her wound doctor every Monday, and he recently changed her diuretic to torsemide 20 mg daily. Will forward to Dr. Johnsie Cancel for further advisement.

## 2015-12-07 NOTE — Progress Notes (Signed)
Patient ID: Mia Contreras, female   DOB: March 15, 1929, 82 y.o.   MRN: [id number]   Mia Contreras is a 82 y.o.  female with a history of nonischemic cardiomyopathy, systolic CHF, PACs, breast cancer. I saw her in f/u 08/22/13. She continued to noted DOE. She admitted to being out of her Advair for ~ 2 mos. I refilled this. Her BNP remained elevated on Lasix and aldactone added  Lexiscan Myoview (04/2011): No ischemia, EF 32%.   Echocardiogram (12/2012): EF 20%, diffuse HK, diastolic dysfunction, aortic sclerosis without stenosis, mild MR, mild to moderate LAE, mildly reduced RVSF, PASP 38.  She was recently placed on a Z-Pak for sinusitis. She continues to note NYHA class 2-2b dyspnea. There has been no improvement with resuming Advair. She denies chest pain, syncope, orthopnea, PND or edema.  Lots of questions about risk of MI and signs of worsening CHF  Weighs herself at home biweekly 135 lbs  Stable no edema   Has been to wound center before for slowly healing leg wounds.    Echo 12/16/14 reviewed: Impressions:  - Normal LV size with EF 30-35%, diffuse hypokinesis. Normal RV size with mildly decreased systolic function. Moderate to severe LAE. Mild MR. Moderate pulmonary hypertension.  Aldactone held recently for azotemia Lasix changed to demedex by wound center   Lab Results  Component Value Date   CREATININE 2.47* 12/01/2015   BUN 54* 12/01/2015   NA 140 12/01/2015   K 5.0 12/01/2015   CL 100 12/01/2015   CO2 30 12/01/2015     ROS: Denies fever, malais, weight loss, blurry vision, decreased visual acuity, cough, sputum, SOB, hemoptysis, pleuritic pain, palpitaitons, heartburn, abdominal pain, melena, lower extremity edema, claudication, or rash.  All other systems reviewed and negative  General: Affect appropriate Healthy:  appears stated age 75: normal Neck supple with no adenopathy JVP normal no bruits no thyromegaly Lungs clear with no wheezing and good diaphragmatic  motion Heart:  S1/S2 SEM  murmur, no rub, gallop or click PMI normal Abdomen: benighn, BS positve, no tenderness, no AAA no bruit.  No HSM or HJR Distal pulses intact with no bruits Chronic venous disease bilaterally with plus one LLE edema  Neuro non-focal Skin warm and dry No muscular weakness   Current Outpatient Prescriptions  Medication Sig Dispense Refill  . ALPRAZolam (XANAX) 0.5 MG tablet Take 1 tablet (0.5 mg total) by mouth 2 (two) times daily as needed for anxiety or sleep. 60 tablet 3  . aspirin 325 MG tablet Take 325 mg by mouth daily.    . benzonatate (TESSALON) 200 MG capsule Take 1 capsule (200 mg total) by mouth 2 (two) times daily as needed for cough. 30 capsule 1  . Calcium Carbonate-Vitamin D (CALCIUM 600+D) 600-400 MG-UNIT per tablet Take 1 tablet by mouth daily.    . carvedilol (COREG) 12.5 MG tablet Take 1 tablet (12.5 mg total) by mouth 2 (two) times daily with a meal. 180 tablet 3  . diphenoxylate-atropine (LOMOTIL) 2.5-0.025 MG per tablet Take 1 tablet by mouth 4 (four) times daily as needed for diarrhea or loose stools. 60 tablet 0  . Fluticasone-Salmeterol (ADVAIR DISKUS) 250-50 MCG/DOSE AEPB Inhale 1 puff into the lungs 2 (two) times daily. 60 each 3  . hydrALAZINE (APRESOLINE) 25 MG tablet Take 25 mg by mouth 2 (two) times daily.    Marland Kitchen losartan (COZAAR) 100 MG tablet TAKE 1 TABLET BY MOUTH EVERY DAY 90 tablet 3  . Multiple Vitamins-Minerals (MULTIVITAMIN PO) Take  1 tablet by mouth daily.     Marland Kitchen torsemide (DEMADEX) 20 MG tablet Take 1-2 tablets daily by mouth daily 30 tablet 0   No current facility-administered medications for this visit.    Allergies  Cefuroxime; Celecoxib; Codeine; Deltasone; Ranitidine; and Tramadol hcl  Electrocardiogram:   06/10/14  SR rate 20 LAD old IMI  Long PR  PAC   12/09/14  SR rate 63  LAD poor R wave progression   Assessment and Plan CHF  Continue current meds   BMET in March was fine   Venous Disease:  F/u [facility]ov  Cut  has healed  Continue diuretics to minimize edema COPD:  Continue advair no active wheezing claritin and flonase for allergies HTN:  Improved with bid hydralazine   Jenkins Rouge   This encounter was created in error - please disregard.

## 2015-12-10 ENCOUNTER — Encounter: Payer: Medicare Other | Admitting: Cardiovascular Disease

## 2015-12-10 ENCOUNTER — Encounter: Payer: Self-pay | Admitting: Cardiovascular Disease

## 2015-12-10 ENCOUNTER — Ambulatory Visit (INDEPENDENT_AMBULATORY_CARE_PROVIDER_SITE_OTHER): Payer: Medicare Other | Admitting: Cardiovascular Disease

## 2015-12-10 VITALS — BP 80/62 | HR 106 | Ht 63.0 in | Wt 153.4 lb

## 2015-12-10 DIAGNOSIS — I5022 Chronic systolic (congestive) heart failure: Secondary | ICD-10-CM

## 2015-12-10 DIAGNOSIS — I491 Atrial premature depolarization: Secondary | ICD-10-CM

## 2015-12-10 MED ORDER — SACUBITRIL-VALSARTAN 24-26 MG PO TABS: 1.0000 | ORAL_TABLET | Freq: Two times a day (BID) | ORAL | Status: DC

## 2015-12-10 MED ORDER — TORSEMIDE 10 MG PO TABS: ORAL_TABLET | ORAL | Status: DC

## 2015-12-10 NOTE — Progress Notes (Signed)
Patient ID: Mia Contreras, female   DOB: 1929-01-22, 82 y.o.   MRN: [id number]   Mia Contreras is a 82 y.o.  female with a history of nonischemic cardiomyopathy, systolic CHF, PACs, breast cancer. I saw her in f/u 08/22/13. She continued to noted DOE. She admitted to being out of her Advair for ~ 2 mos. I refilled this. Her BNP remained elevated on Lasix and aldactone added  Lexiscan Myoview (04/2011): No ischemia, EF 32%.   Echocardiogram (12/2012): EF 20%, diffuse HK, diastolic dysfunction, aortic sclerosis without stenosis, mild MR, mild to moderate LAE, mildly reduced RVSF, PASP 38.  She was recently placed on a Z-Pak for sinusitis. She continues to note NYHA class 2-2b dyspnea. There has been no improvement with resuming Advair. She denies chest pain, syncope, orthopnea, PND    Has been to wound center before for slowly healing leg wounds.    Echo 12/16/14 reviewed: Impressions:  - Normal LV size with EF 30-35%, diffuse hypokinesis. Normal RV size with mildly decreased systolic function. Moderate to severe LAE. Mild MR. Moderate pulmonary hypertension.  Aldactone held recently for azotemia Lasix changed to demedex by wound center   In talking to patient and son from [place] she has been going down hill since wound and fall. She has progressive azotemia, dyspnea , fatigue.  Legs no better despite change in diuretic And BP running low.  ? Cdiff after antibiotic Rx on Leviquin.  Still with diarrhea.    Lab Results  Component Value Date   CREATININE 2.47* 12/01/2015   BUN 54* 12/01/2015   NA 140 12/01/2015   K 5.0 12/01/2015   CL 100 12/01/2015   CO2 30 12/01/2015     ROS: Denies fever, malais, weight loss, blurry vision, decreased visual acuity, cough, sputum, SOB, hemoptysis, pleuritic pain, palpitaitons, heartburn, abdominal pain, melena, lower extremity edema, claudication, or rash.  All other systems reviewed and negative  General: Affect appropriate Chronically ill  white female  HEENT: normal Neck supple with no adenopathy JVP normal no bruits no thyromegaly Lungs clear with no wheezing and good diaphragmatic motion Heart:  S1/S2 SEM  murmur, no rub, gallop or click PMI normal Abdomen: benighn, BS positve, no tenderness, no AAA no bruit.  No HSM or HJR Distal pulses intact with no bruits Chronic venous disease bilaterally with plus 3 LLE edema  LLE wrapped with bilateral bullae  Neuro non-focal Skin warm and dry No muscular weakness   Current Outpatient Prescriptions  Medication Sig Dispense Refill  . ALPRAZolam (XANAX) 0.5 MG tablet Take 1 tablet (0.5 mg total) by mouth 2 (two) times daily as needed for anxiety or sleep. 60 tablet 3  . aspirin 325 MG tablet Take 325 mg by mouth daily.    . Calcium Carbonate-Vitamin D (CALCIUM 600+D) 600-400 MG-UNIT per tablet Take 1 tablet by mouth daily.    . carvedilol (COREG) 12.5 MG tablet Take 1 tablet (12.5 mg total) by mouth 2 (two) times daily with a meal. 180 tablet 3  . diphenoxylate-atropine (LOMOTIL) 2.5-0.025 MG per tablet Take 1 tablet by mouth 4 (four) times daily as needed for diarrhea or loose stools. 60 tablet 0  . Fluticasone-Salmeterol (ADVAIR DISKUS) 250-50 MCG/DOSE AEPB Inhale 1 puff into the lungs 2 (two) times daily. 60 each 3  . hydrALAZINE (APRESOLINE) 25 MG tablet Take 25 mg by mouth 2 (two) times daily.    . Multiple Vitamins-Minerals (MULTIVITAMIN PO) Take 1 tablet by mouth daily.     Marland Kitchen torsemide (  DEMADEX) 10 MG tablet Take 1-2 tablets daily by mouth daily 30 tablet 11  . sacubitril-valsartan (ENTRESTO) 24-26 MG Take 1 tablet by mouth 2 (two) times daily. 60 tablet 11   No current facility-administered medications for this visit.    Allergies  Cefuroxime; Celecoxib; Codeine; Deltasone; Ranitidine; and Tramadol hcl  Electrocardiogram:   06/10/14  SR rate 84 LAD old IMI  Long PR  PAC   12/09/14  SR rate 63  LAD poor R wave progression   Assessment and Plan CHF  Despite echo  showing EF improvement she appears to be functional class 3-4 now.  Significant edema And azotemia. Will try entreso at lowest dose. Decrease demedex and stop cozaar F/U in CHF clinic.  Hopefully Low BP will not be an issue on Entresto with adjustment in ARB and diuretic. She is close to needing to be hospitalized For Rx and needing rehab stay. Had long talk with son about advanced directives and possible poor outcomes in near future  Venous Disease:  Despite low BP and elevated Cr need to try to diurese with demedex and Entresto.  Has f/u wound center next week COPD:  Continue advair no active wheezing claritin and flonase for allergies contributes to dyspnea  HTN:  Low at this point due to DCM and diarea    Jenkins Rouge

## 2015-12-10 NOTE — Patient Instructions (Addendum)
Medication Instructions:  Your physician has recommended you make the following change in your medication:  1-STOP Cozaar 2-Decrease Torsemide 10 mg by mouth daily 3-START Entresto 24-26 mg by mouth twice daily  Labwork: NONE  Testing/Procedures: NONE  Follow-Up: Your physician recommends that you schedule a follow-up appointment as soon as possible Monday or Tuesday at [facility].  Your physician wants you to follow-up next available with Dr. Johnsie Cancel.  If you need a refill on your cardiac medications before your next appointment, please call your pharmacy.

## 2015-12-13 ENCOUNTER — Other Ambulatory Visit: Payer: Self-pay | Admitting: Nurse Practitioner

## 2015-12-13 DIAGNOSIS — S81801A Unspecified open wound, right lower leg, initial encounter: Secondary | ICD-10-CM | POA: Diagnosis not present

## 2015-12-13 DIAGNOSIS — L97821 Non-pressure chronic ulcer of other part of left lower leg limited to breakdown of skin: Secondary | ICD-10-CM | POA: Diagnosis not present

## 2015-12-14 ENCOUNTER — Inpatient Hospital Stay (HOSPITAL_COMMUNITY)
Admission: AD | Admit: 2015-12-14 | Discharge: 2015-12-19 | DRG: 291 | Disposition: A | Discharge status: Home / Self Care | Payer: Medicare Other | Source: Ambulatory Visit | Attending: Internal Medicine | Admitting: Internal Medicine

## 2015-12-14 ENCOUNTER — Ambulatory Visit (HOSPITAL_BASED_OUTPATIENT_CLINIC_OR_DEPARTMENT_OTHER)
Admission: RE | Admit: 2015-12-14 | Discharge: 2015-12-14 | Disposition: A | Discharge status: Home / Self Care | Payer: Medicare Other | Source: Ambulatory Visit | Attending: Internal Medicine | Admitting: Internal Medicine

## 2015-12-14 ENCOUNTER — Encounter (HOSPITAL_COMMUNITY): Payer: Self-pay | Admitting: Internal Medicine

## 2015-12-14 ENCOUNTER — Inpatient Hospital Stay (HOSPITAL_COMMUNITY): Payer: Medicare Other

## 2015-12-14 VITALS — BP 86/58 | HR 88 | Wt 153.0 lb

## 2015-12-14 DIAGNOSIS — Z96651 Presence of right artificial knee joint: Secondary | ICD-10-CM | POA: Diagnosis present

## 2015-12-14 DIAGNOSIS — J45909 Unspecified asthma, uncomplicated: Secondary | ICD-10-CM | POA: Diagnosis present

## 2015-12-14 DIAGNOSIS — F419 Anxiety disorder, unspecified: Secondary | ICD-10-CM | POA: Diagnosis not present

## 2015-12-14 DIAGNOSIS — Z90710 Acquired absence of both cervix and uterus: Secondary | ICD-10-CM | POA: Diagnosis not present

## 2015-12-14 DIAGNOSIS — Z885 Allergy status to narcotic agent status: Secondary | ICD-10-CM | POA: Diagnosis not present

## 2015-12-14 DIAGNOSIS — Z886 Allergy status to analgesic agent status: Secondary | ICD-10-CM | POA: Diagnosis not present

## 2015-12-14 DIAGNOSIS — I5041 Acute combined systolic (congestive) and diastolic (congestive) heart failure: Secondary | ICD-10-CM | POA: Diagnosis not present

## 2015-12-14 DIAGNOSIS — I429 Cardiomyopathy, unspecified: Secondary | ICD-10-CM | POA: Diagnosis present

## 2015-12-14 DIAGNOSIS — Z87891 Personal history of nicotine dependence: Secondary | ICD-10-CM

## 2015-12-14 DIAGNOSIS — I509 Heart failure, unspecified: Secondary | ICD-10-CM

## 2015-12-14 DIAGNOSIS — I071 Rheumatic tricuspid insufficiency: Secondary | ICD-10-CM | POA: Diagnosis present

## 2015-12-14 DIAGNOSIS — Z853 Personal history of malignant neoplasm of breast: Secondary | ICD-10-CM

## 2015-12-14 DIAGNOSIS — N179 Acute kidney failure, unspecified: Secondary | ICD-10-CM | POA: Diagnosis not present

## 2015-12-14 DIAGNOSIS — E785 Hyperlipidemia, unspecified: Secondary | ICD-10-CM | POA: Diagnosis present

## 2015-12-14 DIAGNOSIS — I5022 Chronic systolic (congestive) heart failure: Secondary | ICD-10-CM | POA: Diagnosis not present

## 2015-12-14 DIAGNOSIS — S81809A Unspecified open wound, unspecified lower leg, initial encounter: Secondary | ICD-10-CM | POA: Diagnosis present

## 2015-12-14 DIAGNOSIS — Z79899 Other long term (current) drug therapy: Secondary | ICD-10-CM

## 2015-12-14 DIAGNOSIS — R0602 Shortness of breath: Secondary | ICD-10-CM | POA: Diagnosis not present

## 2015-12-14 DIAGNOSIS — N184 Chronic kidney disease, stage 4 (severe): Secondary | ICD-10-CM | POA: Diagnosis not present

## 2015-12-14 DIAGNOSIS — N182 Chronic kidney disease, stage 2 (mild): Secondary | ICD-10-CM | POA: Diagnosis not present

## 2015-12-14 DIAGNOSIS — Z888 Allergy status to other drugs, medicaments and biological substances status: Secondary | ICD-10-CM

## 2015-12-14 DIAGNOSIS — I130 Hypertensive heart and chronic kidney disease with heart failure and stage 1 through stage 4 chronic kidney disease, or unspecified chronic kidney disease: Secondary | ICD-10-CM | POA: Diagnosis present

## 2015-12-14 DIAGNOSIS — Z7982 Long term (current) use of aspirin: Secondary | ICD-10-CM

## 2015-12-14 DIAGNOSIS — J449 Chronic obstructive pulmonary disease, unspecified: Secondary | ICD-10-CM | POA: Diagnosis present

## 2015-12-14 DIAGNOSIS — I480 Paroxysmal atrial fibrillation: Secondary | ICD-10-CM | POA: Diagnosis not present

## 2015-12-14 DIAGNOSIS — I5023 Acute on chronic systolic (congestive) heart failure: Secondary | ICD-10-CM | POA: Diagnosis not present

## 2015-12-14 DIAGNOSIS — Z9221 Personal history of antineoplastic chemotherapy: Secondary | ICD-10-CM | POA: Diagnosis not present

## 2015-12-14 DIAGNOSIS — Z923 Personal history of irradiation: Secondary | ICD-10-CM | POA: Diagnosis not present

## 2015-12-14 DIAGNOSIS — I5031 Acute diastolic (congestive) heart failure: Secondary | ICD-10-CM | POA: Diagnosis not present

## 2015-12-14 DIAGNOSIS — I272 Other secondary pulmonary hypertension: Secondary | ICD-10-CM | POA: Diagnosis present

## 2015-12-14 DIAGNOSIS — Z9849 Cataract extraction status, unspecified eye: Secondary | ICD-10-CM | POA: Diagnosis not present

## 2015-12-14 DIAGNOSIS — N183 Chronic kidney disease, stage 3 (moderate): Secondary | ICD-10-CM | POA: Diagnosis present

## 2015-12-14 DIAGNOSIS — Z8249 Family history of ischemic heart disease and other diseases of the circulatory system: Secondary | ICD-10-CM

## 2015-12-14 DIAGNOSIS — S81802D Unspecified open wound, left lower leg, subsequent encounter: Secondary | ICD-10-CM | POA: Diagnosis not present

## 2015-12-14 DIAGNOSIS — S81802A Unspecified open wound, left lower leg, initial encounter: Secondary | ICD-10-CM

## 2015-12-14 DIAGNOSIS — I5043 Acute on chronic combined systolic (congestive) and diastolic (congestive) heart failure: Secondary | ICD-10-CM | POA: Diagnosis not present

## 2015-12-14 DIAGNOSIS — N189 Chronic kidney disease, unspecified: Secondary | ICD-10-CM

## 2015-12-14 DIAGNOSIS — Z9012 Acquired absence of left breast and nipple: Secondary | ICD-10-CM | POA: Diagnosis not present

## 2015-12-14 DIAGNOSIS — K589 Irritable bowel syndrome without diarrhea: Secondary | ICD-10-CM | POA: Diagnosis present

## 2015-12-14 LAB — CBC
HCT: 36.5 % (ref 36.0–46.0)
Hemoglobin: 11.3 g/dL — ABNORMAL LOW (ref 12.0–15.0)
MCH: 27.1 pg (ref 26.0–34.0)
MCHC: 31 g/dL (ref 30.0–36.0)
MCV: 87.5 fL (ref 78.0–100.0)
PLATELETS: 277 10*3/uL (ref 150–400)
RBC: 4.17 MIL/uL (ref 3.87–5.11)
RDW: 15.2 % (ref 11.5–15.5)
WBC: 8.2 10*3/uL (ref 4.0–10.5)

## 2015-12-14 LAB — COMPREHENSIVE METABOLIC PANEL
ALT: 28 U/L (ref 14–54)
AST: 28 U/L (ref 15–41)
Albumin: 3.6 g/dL (ref 3.5–5.0)
Alkaline Phosphatase: 60 U/L (ref 38–126)
Anion gap: 13 (ref 5–15)
BUN: 80 mg/dL — AB (ref 6–20)
CHLORIDE: 99 mmol/L — AB (ref 101–111)
CO2: 29 mmol/L (ref 22–32)
Calcium: 9.9 mg/dL (ref 8.9–10.3)
Creatinine, Ser: 2.24 mg/dL — ABNORMAL HIGH (ref 0.44–1.00)
GFR calc Af Amer: 22 mL/min — ABNORMAL LOW (ref >60)
GFR, EST NON AFRICAN AMERICAN: 19 mL/min — AB (ref >60)
Glucose, Bld: 115 mg/dL — ABNORMAL HIGH (ref 65–99)
POTASSIUM: 4.9 mmol/L (ref 3.5–5.1)
SODIUM: 141 mmol/L (ref 135–145)
Total Bilirubin: 1 mg/dL (ref 0.3–1.2)
Total Protein: 6.4 g/dL — ABNORMAL LOW (ref 6.5–8.1)

## 2015-12-14 LAB — URINALYSIS, ROUTINE W REFLEX MICROSCOPIC
BILIRUBIN URINE: NEGATIVE
Glucose, UA: NEGATIVE mg/dL
HGB URINE DIPSTICK: NEGATIVE
KETONES UR: NEGATIVE mg/dL
Leukocytes, UA: NEGATIVE
NITRITE: NEGATIVE
PROTEIN: NEGATIVE mg/dL
SPECIFIC GRAVITY, URINE: 1.014 (ref 1.005–1.030)
pH: 5.5 (ref 5.0–8.0)

## 2015-12-14 LAB — BRAIN NATRIURETIC PEPTIDE: B NATRIURETIC PEPTIDE 5: 1357.2 pg/mL — AB (ref 0.0–100.0)

## 2015-12-14 LAB — MAGNESIUM: MAGNESIUM: 2.3 mg/dL (ref 1.7–2.4)

## 2015-12-14 LAB — PHOSPHORUS: PHOSPHORUS: 4.3 mg/dL (ref 2.5–4.6)

## 2015-12-14 LAB — PREALBUMIN: PREALBUMIN: 21.7 mg/dL (ref 18–38)

## 2015-12-14 LAB — TROPONIN I: TROPONIN I: 0.04 ng/mL — AB (ref <0.031)

## 2015-12-14 LAB — TSH: TSH: 2.773 u[IU]/mL (ref 0.350–4.500)

## 2015-12-14 MED ORDER — SODIUM CHLORIDE 0.9% FLUSH
3.0000 mL | Freq: Two times a day (BID) | INTRAVENOUS | Status: DC
  Administered 2015-12-14 – 2015-12-17 (×6): 3 mL via INTRAVENOUS

## 2015-12-14 MED ORDER — HEPARIN SODIUM (PORCINE) 5000 UNIT/ML IJ SOLN
5000.0000 [IU] | Freq: Three times a day (TID) | INTRAMUSCULAR | Status: DC
  Administered 2015-12-14 – 2015-12-15 (×3): 5000 [IU] via SUBCUTANEOUS
  Filled 2015-12-14 (×3): qty 1

## 2015-12-14 MED ORDER — FUROSEMIDE 10 MG/ML IJ SOLN
40.0000 mg | Freq: Two times a day (BID) | INTRAMUSCULAR | Status: DC
  Administered 2015-12-14 – 2015-12-15 (×2): 40 mg via INTRAVENOUS
  Filled 2015-12-14 (×2): qty 4

## 2015-12-14 MED ORDER — SODIUM CHLORIDE 0.9% FLUSH
3.0000 mL | Freq: Two times a day (BID) | INTRAVENOUS | Status: DC
  Administered 2015-12-15 – 2015-12-19 (×6): 3 mL via INTRAVENOUS

## 2015-12-14 MED ORDER — ASPIRIN 325 MG PO TABS
325.0000 mg | ORAL_TABLET | Freq: Every day | ORAL | Status: DC
  Administered 2015-12-15: 325 mg via ORAL
  Filled 2015-12-14 (×2): qty 1

## 2015-12-14 MED ORDER — DIPHENOXYLATE-ATROPINE 2.5-0.025 MG PO TABS: 1.0000 | ORAL_TABLET | Freq: Four times a day (QID) | ORAL | Status: DC | PRN

## 2015-12-14 MED ORDER — SODIUM CHLORIDE 0.9 % IV SOLN: 250.0000 mL | INTRAVENOUS | Status: DC | PRN

## 2015-12-14 MED ORDER — SODIUM CHLORIDE 0.9% FLUSH: 3.0000 mL | INTRAVENOUS | Status: DC | PRN

## 2015-12-14 MED ORDER — ONDANSETRON HCL 4 MG PO TABS: 4.0000 mg | ORAL_TABLET | Freq: Four times a day (QID) | ORAL | Status: DC | PRN

## 2015-12-14 MED ORDER — ONDANSETRON HCL 4 MG/2ML IJ SOLN: 4.0000 mg | Freq: Four times a day (QID) | INTRAMUSCULAR | Status: DC | PRN

## 2015-12-14 MED ORDER — MOMETASONE FURO-FORMOTEROL FUM 200-5 MCG/ACT IN AERO
2.0000 | INHALATION_SPRAY | Freq: Two times a day (BID) | RESPIRATORY_TRACT | Status: DC
  Administered 2015-12-14 – 2015-12-19 (×10): 2 via RESPIRATORY_TRACT
  Filled 2015-12-14: qty 8.8

## 2015-12-14 MED ORDER — CARVEDILOL 12.5 MG PO TABS
12.5000 mg | ORAL_TABLET | Freq: Two times a day (BID) | ORAL | Status: DC
  Administered 2015-12-14 – 2015-12-19 (×8): 12.5 mg via ORAL
  Filled 2015-12-14 (×9): qty 1

## 2015-12-14 MED ORDER — ALPRAZOLAM 0.5 MG PO TABS
0.5000 mg | ORAL_TABLET | Freq: Two times a day (BID) | ORAL | Status: DC | PRN
  Administered 2015-12-14 – 2015-12-19 (×11): 0.5 mg via ORAL
  Filled 2015-12-14 (×11): qty 1

## 2015-12-14 NOTE — Progress Notes (Signed)
Advanced Heart Failure Medication Review by a Pharmacist  Does the patient  feel that his/her medications are working for him/her?  yes  Has the patient been experiencing any side effects to the medications prescribed?  no  Does the patient measure his/her own blood pressure or blood glucose at home?  yes   Does the patient have any problems obtaining medications due to transportation or finances?   no  Understanding of regimen: good Understanding of indications: good Potential of compliance: good Patient understands to avoid NSAIDs. Patient understands to avoid decongestants.  Issues to address at subsequent visits: None   Pharmacist comments:  Ms. Leake is a pleasant 82 yo F presenting with her daughter and an updated medication list. She reports good compliance with her regimen and states that her peripheral edema has improved significantly since starting Entresto last Friday. Since then her BP has also dropped significantly but she states that she feels ok. She states that she has stopped taking hydralazine since BP has been low and is also only taking 10 mg of torsemide instead of 20 mg.    Doroteo Bradford K. Velva Harman, PharmD, BCPS, CPP Clinical Pharmacist Pager: [phone] Phone: [phone] 12/14/2015 10:56 AM      Time with patient: 10 minutes Preparation and documentation time: 2 minutes Total time: 12 minutes

## 2015-12-14 NOTE — Addendum Note (Signed)
Encounter addended by: Effie Berkshire, RN on: 12/14/2015  1:00 PM<BR>     Documentation filed: Visit Diagnoses, Dx Association, Orders

## 2015-12-14 NOTE — H&P (Addendum)
History and Physical    Mia Contreras [id number] DOB: June 01, 1929 DOA: 12/14/2015  Referring MD/NP/PA: Dr. Haroldine Laws  PCP: Walker Kehr, MD  Outpatient Specialists: Cardiology - Heart failure Patient coming from: Heart Failure Clinic  Chief Complaint: LE swelling  HPI: Mia Contreras is a 82 y.o. female with medical history significant of thyroid dysfunction, HLD, anemia, HTN, venous insufficiency, chronic left leg wound, scoliosis, breast cancer, PAC, IBS presenting to heart failure clinic for initial intake examination. During visit it became a apparent that over the past 4 weeks patient has developed significant decompensation from a cardiac standpoint as well as some worsening renal function.  Of note patient states she's had about 4 week history of progressive lower extremity edema extending from her ankles to her thighs. Patient states that this all started after she was treated for bronchitis with a Z-Pak and steroids. Patient has continued to take her torsemide 10 mg twice a day without any improvement in her urine output. Patient states make urine. Patient denies any orthopnea, worsening shortness of breath though she does have baseline shortness of breath, chest pain, palpitations, nausea, vomiting, dysuria, frequency, back pain, rash. Patient does endorse a left lower extremity wound that she sustained approximately 7 weeks ago. She has been seen in the wound care clinic for this and has sterile bandages changes every 7 days. The wound is slowly healing but currently shows no signs of infection per the patient. Bandages were changed 1 day ago. Patient has used right lower extremity compression bandages with some improvement in the edema.  Review of Systems: As per HPI otherwise 10 point review of systems negative.     Past Medical History  Diagnosis Date  . GOITER, MULTINODULAR 05/06/2010    Dr [name]  . HYPERTHYROIDISM 05/23/2010  . HYPERLIPIDEMIA 08/20/2007  . ANEMIA-NOS  08/20/2007  . ANXIETY 03/16/2007  . CARPAL TUNNEL SYNDROME, RIGHT 01/11/2008  . HYPERTENSION 03/16/2007  . VENOUS INSUFFICIENCY 09/05/2007  . DIVERTICULOSIS, COLON 08/20/2007  . CELLULITIS, LEG, RIGHT 08/20/2007  . OSTEOARTHRITIS 03/16/2007  . OSTEOPOROSIS 08/20/2007  . SCOLIOSIS 01/11/2008  . BREAST CANCER, HX OF 03/16/2007  . SKIN CANCER, HX OF 11/12/2008     R cheek 2010, L Cheek 2011 Dr. Tonia Brooms  . PREMATURE ATRIAL CONTRACTIONS 09/21/2010  . Thyroid nodule   . Cataract     floaters  . Pneumonia 07/2011    took abx for several weeks  . IBS (irritable bowel syndrome)   . Collagenous colitis     Past Surgical History  Procedure Laterality Date  . Total knee arthroplasty    . Breast lumpectomy Left 2001  . Abdominal hysterectomy    . Appendectomy  1972  . Mastectomy, partial Left   . Carpal tunnel release    . Total knee arthroplasty Right 2010    Dr Para March  . Cataract extraction    . Skin cancer excision      face-Dr. Bing Plume  . Xrt      chemo, surgery  . Eye surgery      cataract ext  . Total knee arthroplasty  10/30/2011    Procedure: TOTAL KNEE ARTHROPLASTY;  Surgeon: Lorn Junes, MD;  Location: [facility];  Service: Orthopedics;  Laterality: Left;     . Hernia repair       reports that she quit smoking about 35 years ago. She has never used smokeless tobacco. She reports that she drinks alcohol. She reports that she does not use illicit drugs.  Allergies  Allergen Reactions  . Cefuroxime Diarrhea  . Celecoxib Nausea Only  . Codeine Nausea Only  . Deltasone [Prednisone]     Oral deltasone is causing swelling (can use Depo-Medrol)  . Ranitidine     bloating  . Tramadol Hcl Nausea Only    Family History  Problem Relation Age of Onset  . Dementia Mother   . Heart disease Mother   . Mental retardation Mother   . Colon cancer Neg Hx   . Hypertension Mother   . Alzheimer's disease Mother   . Prostate cancer Brother   . Diabetes Other   . Heart attack Father   .  Alcohol abuse Father   . Anesthesia problems Neg Hx    Unacceptable: Noncontributory, unremarkable, or negative. Acceptable: Family history reviewed and not pertinent (If you reviewed it)  Prior to Admission medications   Medication Sig Start Date End Date Taking? Authorizing Provider  ALPRAZolam Duanne Moron) 0.5 MG tablet Take 1 tablet (0.5 mg total) by mouth 2 (two) times daily as needed for anxiety or sleep. 11/10/15   Aleksei Plotnikov V, MD  aspirin 325 MG tablet Take 325 mg by mouth daily.    Historical Provider, MD  Calcium Carbonate-Vitamin D (CALCIUM 600+D) 600-400 MG-UNIT per tablet Take 1 tablet by mouth daily.    Historical Provider, MD  carvedilol (COREG) 12.5 MG tablet Take 1 tablet (12.5 mg total) by mouth 2 (two) times daily with a meal. 11/10/15   Aleksei Plotnikov V, MD  diphenoxylate-atropine (LOMOTIL) 2.5-0.025 MG per tablet Take 1 tablet by mouth 4 (four) times daily as needed for diarrhea or loose stools. 02/06/14   Aleksei Plotnikov V, MD  Fluticasone-Salmeterol (ADVAIR DISKUS) 250-50 MCG/DOSE AEPB Inhale 1 puff into the lungs 2 (two) times daily. 05/12/15   Aleksei Plotnikov V, MD  Multiple Vitamins-Minerals (MULTIVITAMIN PO) Take 1 tablet by mouth daily.     Historical Provider, MD  sacubitril-valsartan (ENTRESTO) 24-26 MG Take 1 tablet by mouth 2 (two) times daily. 12/10/15   Josue Hector, MD  torsemide (DEMADEX) 20 MG tablet Take 10 mg by mouth daily.    Historical Provider, MD    Physical Exam: There were no vitals filed for this visit.    Constitutional: NAD, calm, comfortable There were no vitals filed for this visit.   Eyes: PERRL, lids and conjunctivae normal ENMT: Mucous membranes are moist. Normal dentition.  Neck: normal, supple, no masses, no thyromegaly Respiratory: clear to auscultation bilaterally, no wheezing, no crackles. Occasionally speaks in short sentences w/ short lived increased respiratory effort.   Cardiovascular: no murmurs / rubs / gallops. 3+  edema in the right lower extremity to the level of the mid thigh. Left lower extremity unable to fully appreciate due to compressive wrap to the level of the knee. No carotid bruits.  Abdomen: no tenderness, no masses palpated. No hepatosplenomegaly. Bowel sounds positive.  Musculoskeletal: no clubbing / cyanosis. No joint deformity upper and lower extremities. Good ROM, no contractures. Normal muscle tone.  Skin: no rashes, lesions, ulcers. No induration Neurologic: CN 2-12 grossly intact. Sensation intact, DTR normal. Strength 5/5 in all 4.  Psychiatric: Normal judgment and insight. Alert and oriented x 3. Normal mood.    Labs on Admission: I have personally reviewed following labs and imaging studies  CBC: No results for input(s): WBC, NEUTROABS, HGB, HCT, MCV, PLT in the last 168 hours. Basic Metabolic Panel: No results for input(s): NA, K, CL, CO2, GLUCOSE, BUN, CREATININE, CALCIUM, MG, PHOS in the last  168 hours. GFR: Estimated Creatinine Clearance: 15.8 mL/min (by C-G formula based on Cr of 2.47). Liver Function Tests: No results for input(s): AST, ALT, ALKPHOS, BILITOT, PROT, ALBUMIN in the last 168 hours. No results for input(s): LIPASE, AMYLASE in the last 168 hours. No results for input(s): AMMONIA in the last 168 hours. Coagulation Profile: No results for input(s): INR, PROTIME in the last 168 hours. Cardiac Enzymes: No results for input(s): CKTOTAL, CKMB, CKMBINDEX, TROPONINI in the last 168 hours. BNP (last 3 results)  Recent Labs  12/01/15 1052  PROBNP 1090.0*   HbA1C: No results for input(s): HGBA1C in the last 72 hours. CBG: No results for input(s): GLUCAP in the last 168 hours. Lipid Profile: No results for input(s): CHOL, HDL, LDLCALC, TRIG, CHOLHDL, LDLDIRECT in the last 72 hours. Thyroid Function Tests: No results for input(s): TSH, T4TOTAL, FREET4, T3FREE, THYROIDAB in the last 72 hours. Anemia Panel: No results for input(s): VITAMINB12, FOLATE, FERRITIN,  TIBC, IRON, RETICCTPCT in the last 72 hours. Urine analysis:    Component Value Date/Time   COLORURINE YELLOW 05/06/2015 1008   APPEARANCEUR CLEAR 05/06/2015 1008   LABSPEC 1.010 05/06/2015 1008   PHURINE 6.5 05/06/2015 1008   GLUCOSEU NEGATIVE 05/06/2015 1008   GLUCOSEU NEGATIVE 11/05/2012 1015   HGBUR NEGATIVE 05/06/2015 1008   [place] 05/06/2015 1008   [place] 05/06/2015 1008   PROTEINUR 30* 11/05/2012 1015   UROBILINOGEN 0.2 05/06/2015 1008   NITRITE NEGATIVE 05/06/2015 1008   LEUKOCYTESUR MODERATE* 05/06/2015 1008   Sepsis Labs: @LABRCNTIP (procalcitonin:4,lacticidven:4) )No results found for this or any previous visit (from the past 240 hour(s)).   Radiological Exams on Admission: No results found.   Assessment/Plan Active Problems:   hx: breast cancer, left, infiltrating ductal   Leg wound, left   Acute on chronic combined systolic and diastolic heart failure (HCC)   Acute kidney injury (HCC)   CKD (chronic kidney disease) stage 2, GFR 60-89 ml/min   Anxiety   IBS (irritable bowel syndrome)  Acute on chronic combined systolic and diastolic congestive heart failure. Echo from 2016 shows EF of 30% and grade 1 diastolic dysfunction. Patient with four-week history of weight gain. Currently 15 pounds above dry weight of 138 pounds. BNP from labs drawn on 12/01/2015 greater than 1000. Patient has not increased her home torsemide dose of 10 mg twice a day. Seen by Dr. Haroldine Laws in HF clinic for intake exam and admitted directly from clinic. HF team to follow - Tele - IV lasix 40 BID - Repeat Echo - strict I/O, Dly wts - CHF order set utilized.  - F/U HF recs - Hold Entresto given AKI/CKD, but resume when renal function improves - Continue coreg  AoCKD: Cr 2.47 on admission. Baseline 1.1. Suspect from cardiorenal syndrome and minimally from chronic diuretic use. FENA would not be beneficial given active diuresis. Serum protein nml and multiple urine  protein tests nml in past.  - Renal US - BMET in am - Anticipate worsening prior to improvement given aggressive diuresis, however if not improving then consider renal consult. - UA  LLE wound: skin tear w/ Clarke injury. Present for 7 weeks. Excellent outpt f/u at wound clinic. Last bandage change 1 day ago.  - Do not change dressing unless becomes symptomatic - Treatment of CHF as above will help w/ leg wound healing - prealbumin and nutrition consult - Pt/Ot (Numerous falls and LE wounds in past from running into household items. May need higher level of care at home/facility)  Anxiety: - continue xanax  Asthma/COPD: - continue advair  IBS: - continue Lomotil  L Breast Ca: s/p chemo 2008. No recurrence   DVT prophylaxis: Hep Code Status: FULL Family Communication: daughter Disposition Plan: Inpt admission w/ possible placement to SNF v/s w/ in home health care Consults called: CHF team Admission status: Inpt - Tele   Eh Sesay J MD [facility]   If 7PM-7AM, please contact night-coverage [email or web address] Password [email or web address]  12/14/2015, 1:34 PM

## 2015-12-14 NOTE — Progress Notes (Signed)
Patient ID: Mia Contreras, female   DOB: Jan 04, 1929, 82 y.o.   MRN: [id number]    Advanced Heart Failure Clinic Note   Referring Physician: Dr Johnsie Cancel Primary Care: Arlana Lindau, MD Primary Cardiologist: Dr Johnsie Cancel Primary HF: New (Dr. Haroldine Laws)  HPI:   Mia Contreras is a 82 y.o. female with a history of nonischemic cardiomyopathy, systolic CHF, PACs, L Breast cancer in 2000 with surgery and chemo.  Echo 12/16/14  Normal LV size with EF 30-35%, diffuse hypokinesis. Normal RVsize with mildly decreased systolic function. Moderate to severeLAE. Mild MR. Moderate pulmonary hypertension.  She has never had a cath. Had 4 rounds of chemo for breast CA. Doesn't remember which agents she got. Does not recall a red medicine.   Seen by Dr Johnsie Cancel last week and switched to [facility].   She presents today to establish in the HF clinic. 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 Says swelling and shortness of breath started after a course of prednisone for bronchitis. She states she is actually feeling ok today, despite marked hypotension.  States she has been feeling better over the past few weeks overall. Edema in legs better per her Niece, who is with her today. States she doesn't have an appetite. No CP, orthopnea, or PND. She does have lightheadedness and dizziness. She denies near-syncope.  She does get anxious and takes xanax as needed for that. Lives at home by herself in [place]. Niece lives in [place], no other close family. She drinks water throughout the day, but usually only 2-3 glasses. Watches her salt. Loves potato chips, but has cut way back, hasn't had in several months. Did have BBQ last night.  Baseline weight she states is 138 lbs (which she weighed a month ago).     Brother also has CAD and afib, Mother had CABG but did well, Father passed away from MI at age 61 thought to be 2/2 to heavy ETOH use. Pt has a distant history of smoking. Pt has 50-60 pack years  up to 2ppd. Quit in 1982.   Recent labs:  12/01/15: Sodium 140 K 5.0 creatinine 2.5 (was 1.3 in 3/17). Albumin 3.8 BNP 1090   Review of Systems: [y] = yes, [ ]  = no   General: Weight gain [y]; Weight loss [ ] ; Anorexia [ ] ; Fatigue [ ] ; Fever [ ] ; Chills [ ] ; Weakness [y]  Cardiac: Chest pain/pressure [ ] ; Resting SOB [ ] ; Exertional SOB [y]; Orthopnea [ ] ; Pedal Edema [y]; Palpitations [ ] ; Syncope [ ] ; Presyncope [ ] ; Paroxysmal nocturnal dyspnea[ ]   Pulmonary: Cough [y]; Wheezing[ ] ; Hemoptysis[ ] ; Sputum [ ] ; Snoring [ ]   GI: Vomiting[ ] ; Dysphagia[ ] ; Melena[ ] ; Hematochezia [ ] ; Heartburn[ ] ; Abdominal pain [ ] ; Constipation [ ] ; Diarrhea [ ] ; BRBPR [ ]   GU: Hematuria[ ] ; Dysuria [ ] ; Nocturia[ ]   Vascular: Pain in legs with walking [y]; Pain in feet with lying flat [ ] ; Non-healing sores [ ] ; Stroke [ ] ; TIA [ ] ; Slurred speech [ ] ;  Neuro: Headaches[ ] ; Vertigo[ ] ; Seizures[ ] ; Paresthesias[ ] ;Blurred vision [ ] ; Diplopia [ ] ; Vision changes [ ]   Ortho/Skin: Arthritis [ y]; Joint pain Blue.Reese ]; Muscle pain [ ] ; Joint swelling [ ] ; Back Pain [ ] ; Rash [ ]   Psych: Depression[ ] ; Anxiety[ ]   Heme: Bleeding problems [ ] ; Clotting disorders [ ] ; Anemia [ ]   Endocrine: Diabetes [ ] ; Thyroid dysfunction[y ]   Past  Medical History  Diagnosis Date  . GOITER, MULTINODULAR 05/06/2010    Dr [name]  . HYPERTHYROIDISM 05/23/2010  . HYPERLIPIDEMIA 08/20/2007  . ANEMIA-NOS 08/20/2007  . ANXIETY 03/16/2007  . CARPAL TUNNEL SYNDROME, RIGHT 01/11/2008  . HYPERTENSION 03/16/2007  . VENOUS INSUFFICIENCY 09/05/2007  . DIVERTICULOSIS, COLON 08/20/2007  . CELLULITIS, LEG, RIGHT 08/20/2007  . OSTEOARTHRITIS 03/16/2007  . OSTEOPOROSIS 08/20/2007  . SCOLIOSIS 01/11/2008  . BREAST CANCER, HX OF 03/16/2007  . SKIN CANCER, HX OF 11/12/2008     R cheek 2010, L Cheek 2011 Dr. Tonia Brooms  . PREMATURE ATRIAL CONTRACTIONS 09/21/2010  . Thyroid nodule   . Cataract     floaters  . Pneumonia 07/2011    took abx for  several weeks  . IBS (irritable bowel syndrome)   . Collagenous colitis     Current Outpatient Prescriptions  Medication Sig Dispense Refill  . ALPRAZolam (XANAX) 0.5 MG tablet Take 1 tablet (0.5 mg total) by mouth 2 (two) times daily as needed for anxiety or sleep. 60 tablet 3  . aspirin 325 MG tablet Take 325 mg by mouth daily.    . Calcium Carbonate-Vitamin D (CALCIUM 600+D) 600-400 MG-UNIT per tablet Take 1 tablet by mouth daily.    . carvedilol (COREG) 12.5 MG tablet Take 1 tablet (12.5 mg total) by mouth 2 (two) times daily with a meal. 180 tablet 3  . diphenoxylate-atropine (LOMOTIL) 2.5-0.025 MG per tablet Take 1 tablet by mouth 4 (four) times daily as needed for diarrhea or loose stools. 60 tablet 0  . Fluticasone-Salmeterol (ADVAIR DISKUS) 250-50 MCG/DOSE AEPB Inhale 1 puff into the lungs 2 (two) times daily. 60 each 3  . Multiple Vitamins-Minerals (MULTIVITAMIN PO) Take 1 tablet by mouth daily.     . sacubitril-valsartan (ENTRESTO) 24-26 MG Take 1 tablet by mouth 2 (two) times daily. 60 tablet 11  . torsemide (DEMADEX) 20 MG tablet Take 10 mg by mouth daily.     No current facility-administered medications for this encounter.    Allergies  Allergen Reactions  . Cefuroxime Diarrhea  . Celecoxib Nausea Only  . Codeine Nausea Only  . Deltasone [Prednisone]     Oral deltasone is causing swelling (can use Depo-Medrol)  . Ranitidine     bloating  . Tramadol Hcl Nausea Only      Social History   Social History  . Marital Status: Single    Spouse Name: N/A  . Number of Children: N/A  . Years of Education: N/A   Occupational History  . Not on file.   Social History Main Topics  . Smoking status: Former Smoker    Quit date: 12/20/1980  . Smokeless tobacco: Never Used  . Alcohol Use: Yes     Comment: socially  . Drug Use: No  . Sexual Activity: Not Currently   Other Topics Concern  . Not on file   Social History Narrative      Family History  Problem  Relation Age of Onset  . Dementia Mother   . Heart disease Mother   . Mental retardation Mother   . Colon cancer Neg Hx   . Hypertension Mother   . Alzheimer's disease Mother   . Prostate cancer Brother   . Diabetes Other   . Heart attack Father   . Alcohol abuse Father   . Anesthesia problems Neg Hx     Filed Vitals:   12/14/15 1033  BP: 86/58  Pulse: 88  Weight: 153 lb (69.4 kg)  SpO2: 91%  Wt Readings from Last 3 Encounters:  12/14/15 153 lb (69.4 kg)  12/10/15 153 lb 6.4 oz (69.582 kg)  12/01/15 153 lb 12 oz (69.741 kg)    PHYSICAL EXAM: General:  Elderly. Sitting in [place]. No respiratory difficulty HEENT: normal Neck: supple. JVP 7-8. Carotids 2+ bilat; no bruits. No lymphadenopathy or thryomegaly appreciated. Cor: PMI nondisplaced. Irregular rate & rhythm. No rubs, gallops or murmurs. Lungs: clear dull at left base Abdomen: soft, NT, ND, no HSM. No bruits or masses. +BS  Extremities: no cyanosis, clubbing, rash, 3-4+ edema into thighs. Left leg wrapped with gauze due to wound Neuro: alert & oriented x 3, cranial nerves grossly intact. moves all 4 extremities w/o difficulty. Affect pleasant.  ECG (4/16): NSR with inferior and anterior q waves  ASSESSMENT & PLAN: 1. Severe lower edema 2. Acute on chronic systolic HF with R>>L symptoms 3. Acute on chronic renal failure, stage 4 4. LE wounds 5.  H/o COPD 6. H/o breast cancer s/p chemo 2000  She has severe lower extremity edema with ulcerations which seems out of proportion to her HF. Given worsening renal function I worry about possible nephrotic syndrome but recent albumin is OK. BP remains very low. At this point, I think the best option is to admit to the hospitalist service for further w/u and attempted IV diuresis. Would repeat UA, ECHO and renal u/s. Continue compression wraps. May need Renal to see. Will also need PT and possible SNF placement.    In looking at her ECG, I worry she may have underlying ischemic  CM but given renal function not candidate for cath at this point.   Jolaine Artist MD

## 2015-12-15 ENCOUNTER — Inpatient Hospital Stay (HOSPITAL_COMMUNITY): Payer: Medicare Other

## 2015-12-15 ENCOUNTER — Ambulatory Visit: Payer: Medicare Other | Admitting: Internal Medicine

## 2015-12-15 ENCOUNTER — Other Ambulatory Visit: Payer: Self-pay | Admitting: Nurse Practitioner

## 2015-12-15 ENCOUNTER — Telehealth: Payer: Self-pay | Admitting: Internal Medicine

## 2015-12-15 DIAGNOSIS — I480 Paroxysmal atrial fibrillation: Secondary | ICD-10-CM

## 2015-12-15 DIAGNOSIS — I509 Heart failure, unspecified: Secondary | ICD-10-CM

## 2015-12-15 DIAGNOSIS — I5043 Acute on chronic combined systolic (congestive) and diastolic (congestive) heart failure: Secondary | ICD-10-CM

## 2015-12-15 DIAGNOSIS — N184 Chronic kidney disease, stage 4 (severe): Secondary | ICD-10-CM

## 2015-12-15 DIAGNOSIS — N179 Acute kidney failure, unspecified: Secondary | ICD-10-CM

## 2015-12-15 LAB — BASIC METABOLIC PANEL
Anion gap: 11 (ref 5–15)
BUN: 73 mg/dL — AB (ref 6–20)
CHLORIDE: 101 mmol/L (ref 101–111)
CO2: 29 mmol/L (ref 22–32)
Calcium: 9 mg/dL (ref 8.9–10.3)
Creatinine, Ser: 1.99 mg/dL — ABNORMAL HIGH (ref 0.44–1.00)
GFR calc Af Amer: 25 mL/min — ABNORMAL LOW (ref >60)
GFR, EST NON AFRICAN AMERICAN: 22 mL/min — AB (ref >60)
GLUCOSE: 109 mg/dL — AB (ref 65–99)
POTASSIUM: 4.1 mmol/L (ref 3.5–5.1)
Sodium: 141 mmol/L (ref 135–145)

## 2015-12-15 LAB — CBC
HEMATOCRIT: 31.9 % — AB (ref 36.0–46.0)
Hemoglobin: 9.8 g/dL — ABNORMAL LOW (ref 12.0–15.0)
MCH: 26.8 pg (ref 26.0–34.0)
MCHC: 30.7 g/dL (ref 30.0–36.0)
MCV: 87.4 fL (ref 78.0–100.0)
Platelets: 215 10*3/uL (ref 150–400)
RBC: 3.65 MIL/uL — ABNORMAL LOW (ref 3.87–5.11)
RDW: 15.3 % (ref 11.5–15.5)
WBC: 6.4 10*3/uL (ref 4.0–10.5)

## 2015-12-15 MED ORDER — APIXABAN 2.5 MG PO TABS
2.5000 mg | ORAL_TABLET | Freq: Two times a day (BID) | ORAL | Status: DC
  Administered 2015-12-15 – 2015-12-19 (×8): 2.5 mg via ORAL
  Filled 2015-12-15 (×8): qty 1

## 2015-12-15 MED ORDER — ENSURE ENLIVE PO LIQD
237.0000 mL | Freq: Two times a day (BID) | ORAL | Status: DC
  Administered 2015-12-15 – 2015-12-19 (×6): 237 mL via ORAL

## 2015-12-15 MED ORDER — SPIRONOLACTONE 25 MG PO TABS
12.5000 mg | ORAL_TABLET | Freq: Every day | ORAL | Status: DC
  Administered 2015-12-15 – 2015-12-19 (×5): 12.5 mg via ORAL
  Filled 2015-12-15 (×5): qty 1

## 2015-12-15 MED ORDER — FUROSEMIDE 10 MG/ML IJ SOLN: 80.0000 mg | Freq: Two times a day (BID) | INTRAMUSCULAR | Status: DC

## 2015-12-15 NOTE — Telephone Encounter (Signed)
Ok Thx 

## 2015-12-15 NOTE — Evaluation (Signed)
Physical Therapy Evaluation Patient Details Name: Mia Contreras MRN: [id number] DOB: 1928-11-21 Today's Date: 12/15/2015   History of Present Illness  pt presents with Acute on Chronic CHF.  pt with hx of CHF, Thyroid Dysfunction, Anemia, HTN, Venous Insufficiency, Scoliosis, Breast CA, Skin CA, Bil TKR, Partial Mastectomy, and Carpal Tunnel Release.    Clinical Impression  Pt mildly unsteady and fatigues easily.  Pt indicates she doesn't feel that far from her baseline other than stiffness in her knees.  Discussed if pt could get A from family or friends and pt only indicates she has neighbors that can help her.  Pt would benefit from HHPT and an Aide at D/C to A with return to home and independence.  Will continue to follow.      Follow Up Recommendations Home health PT;Supervision - Intermittent    Equipment Recommendations  None recommended by PT    Recommendations for Other Services       Precautions / Restrictions Precautions Precautions: Fall Restrictions Weight Bearing Restrictions: No      Mobility  Bed Mobility Overal bed mobility: Needs Assistance Bed Mobility: Supine to Sit;Sit to Supine     Supine to sit: Supervision Sit to supine: Supervision   General bed mobility comments: pt needs increased time, but is able to complete without A.    Transfers Overall transfer level: Needs assistance Equipment used: Rolling walker (2 wheeled) Transfers: Sit to/from Stand Sit to Stand: Min guard         General transfer comment: pt mildly unsteady with coming to standing and needed close guarding, but no A.    Ambulation/Gait Ambulation/Gait assistance: Min guard Ambulation Distance (Feet): 80 Feet Assistive device: Rolling walker (2 wheeled) Gait Pattern/deviations: Step-through pattern;Decreased stride length     General Gait Details: pt tends to have a rather flexed posture and pushes RW far out in front of her despite cueing for safe positioning in RW,  especially during turns.    Stairs            Wheelchair Mobility    Modified Rankin (Stroke Patients Only)       Balance Overall balance assessment: Needs assistance Sitting-balance support: No upper extremity supported;Feet supported Sitting balance-Leahy Scale: Good     Standing balance support: No upper extremity supported Standing balance-Leahy Scale: Fair                               Pertinent Vitals/Pain Pain Assessment: Faces Faces Pain Scale: Hurts a little bit Pain Location: pt indicates her knees are just stiff. Pain Descriptors / Indicators: Tightness Pain Intervention(s): Monitored during session;Repositioned    Home Living Family/patient expects to be discharged to:: Private residence Living Arrangements: Alone Available Help at Discharge: Friend(s);Available PRN/intermittently Type of Home:  (Townhome) Home Access: Stairs to enter Entrance Stairs-Rails: None Entrance Stairs-Number of Steps: 1 (pt indicates only a few inches tall.) Home Layout: One level Home Equipment: Walker - 2 wheels      Prior Function Level of Independence: Independent with assistive device(s)               Hand Dominance        Extremity/Trunk Assessment   Upper Extremity Assessment: Defer to OT evaluation           Lower Extremity Assessment: Generalized weakness      Cervical / Trunk Assessment: Kyphotic  Communication   Communication: No difficulties  Cognition Arousal/Alertness:  Awake/alert Behavior During Therapy: WFL for tasks assessed/performed Overall Cognitive Status: Within Functional Limits for tasks assessed                      General Comments      Exercises        Assessment/Plan    PT Assessment Patient needs continued PT services  PT Diagnosis Difficulty walking;Generalized weakness   PT Problem List Decreased strength;Decreased activity tolerance;Decreased balance;Decreased mobility;Decreased  coordination;Decreased knowledge of use of DME  PT Treatment Interventions DME instruction;Gait training;Stair training;Functional mobility training;Therapeutic activities;Therapeutic exercise;Balance training;Patient/family education   PT Goals (Current goals can be found in the Care Plan section) Acute Rehab PT Goals Patient Stated Goal: Home PT Goal Formulation: With patient Time For Goal Achievement: 12/29/15 Potential to Achieve Goals: Good    Frequency Min 3X/week   Barriers to discharge Decreased caregiver support      Co-evaluation               End of Session Equipment Utilized During Treatment: Gait belt Activity Tolerance: Patient limited by fatigue Patient left: in bed;with call bell/phone within reach Nurse Communication: Mobility status         Time: [id number] PT Time Calculation (min) (ACUTE ONLY): 31 min   Charges:   PT Evaluation $PT Eval Moderate Complexity: 1 Procedure PT Treatments $Gait Training: 8-22 mins   PT G CodesCatarina Hartshorn, [place] 12/15/2015, 2:59 PM

## 2015-12-15 NOTE — Progress Notes (Addendum)
Initial Nutrition Assessment  DOCUMENTATION CODES:   Not applicable  INTERVENTION:   Ensure Enlive po BID, each supplement provides 350 kcal and 20 grams of protein  NUTRITION DIAGNOSIS:   Increased nutrient needs related to chronic illness as evidenced by estimated needs  GOAL:   Patient will meet greater than or equal to 90% of their needs   MONITOR:   PO intake, Supplement acceptance, Labs, Weight trends, I & O's  REASON FOR ASSESSMENT:   Consult Assessment of nutrition requirement/status  ASSESSMENT:   82 yo Female with a history of nonischemic cardiomyopathy, systolic CHF, PACs, L Breast cancer in 2000 with surgery and chemo; found to have significant lower extremity edema and marked hypotension.  RD unable to obtain nutrition hx at time of visit. Pt being examined per Advanced Heart Failure Team. Per chart review, pt with a decreased appetite. Would benefit from oral nutrition supplements >> will order. Unable to complete Nutrition Focused Physical Exam at this time.  Diet Order:  Diet Heart Room service appropriate?: Yes; Fluid consistency:: Thin  Skin:  Wound (see comment) (LLE wound: skin tear w/  injury)  Last BM:  4/25  Height:   Ht Readings from Last 1 Encounters:  12/14/15 5\' 3"  (1.6 m)    Weight:   Wt Readings from Last 1 Encounters:  12/15/15 151 lb 6.4 oz (68.675 kg)    Ideal Body Weight:  52.2 kg  BMI:  Body mass index is 26.83 kg/(m^2).  Estimated Nutritional Needs:   Kcal:  1450-1650  Protein:  75-85 gm  Fluid:  </= 1.5 L  EDUCATION NEEDS:   No education needs identified at this time  Arthur Holms, RD, LDN Pager #: [phone] After-Hours Pager #: [phone]

## 2015-12-15 NOTE — Consult Note (Signed)
Advanced Heart Failure Team Consult Note  Referring Physician: Dr Allyson Sabal Primary Physician: Walker Kehr, MD Primary Cardiologist:  Dr Johnsie Cancel  Reason for Consultation: Severe lower edema, A/C systolic HF with R>L symptoms.   HPI:    Mia Contreras is a 82 y.o. female with a history of nonischemic cardiomyopathy, systolic CHF, PACs, L Breast cancer in 2000 with surgery and chemo.  Seen in HF clinic yesterday to establish. Was found to have significant lower extremity edema and marked hypotension. 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 Says swelling and shortness of breath started after a course of prednisone for bronchitis. Had low appetite. She does have lightheadedness and dizziness. Baseline weight she states is 138 lbs (which she weighed a month ago).     Renal US 12/14/15 negative CXR 12/15/15 with large left retrocardiac diaphragmatic/ hiatal hernia again noted. Persistent mild left basilar atelectasis or scarring. There is dextroscoliosis lower thoracic spine. No infiltrate or pulmonary edema.  Says her breathing and swelling feel a little bit better. Less lightheaded and dizzy but hasn't gotten out of bed. Urine clear, peeing a lot on IV lasix.   No CP or palpitations.  Had some wheezing that improved with neb.   Review of Systems: [y] = yes, [ ]  = no   General: Weight gain [y]; Weight loss [ ] ; Anorexia [ ] ; Fatigue [ ] ; Fever [ ] ; Chills [ ] ; Weakness [ ]   Cardiac: Chest pain/pressure [ ] ; Resting SOB [ ] ; Exertional SOB [y]; Orthopnea [ ] ; Pedal Edema [ ] ; Palpitations [ ] ; Syncope [ ] ; Presyncope [ ] ; Paroxysmal nocturnal dyspnea[ ]   Pulmonary: Cough [ ] ; Wheezing[y]; Hemoptysis[ ] ; Sputum [ ] ; Snoring [ ]   GI: Vomiting[ ] ; Dysphagia[ ] ; Melena[ ] ; Hematochezia [ ] ; Heartburn[ ] ; Abdominal pain [ ] ; Constipation [ ] ; Diarrhea [ ] ; BRBPR [ ]   GU: Hematuria[ ] ; Dysuria [ ] ; Nocturia[ ]   Vascular: Pain in legs with walking [ ] ; Pain in feet with lying  flat [ ] ; Non-healing sores [ ] ; Stroke [ ] ; TIA [ ] ; Slurred speech [ ] ;  Neuro: Headaches[ ] ; Vertigo[ ] ; Seizures[ ] ; Paresthesias[ ] ;Blurred vision [ ] ; Diplopia [ ] ; Vision changes [ ]   Ortho/Skin: Arthritis [y]; Joint pain [y]; Muscle pain [ ] ; Joint swelling [ ] ; Back Pain [ ] ; Rash [ ]   Psych: Depression[ ] ; Anxiety[ ]   Heme: Bleeding problems [ ] ; Clotting disorders [ ] ; Anemia [ ]   Endocrine: Diabetes [ ] ; Thyroid dysfunction[ ]   Home Medications Prior to Admission medications   Medication Sig Start Date End Date Taking? Authorizing Provider  ALPRAZolam Duanne Moron) 0.5 MG tablet Take 1 tablet (0.5 mg total) by mouth 2 (two) times daily as needed for anxiety or sleep. 11/10/15  Yes Aleksei Plotnikov V, MD  aspirin 325 MG tablet Take 325 mg by mouth daily.   Yes Historical Provider, MD  Calcium Carbonate-Vitamin D (CALCIUM 600+D) 600-400 MG-UNIT per tablet Take 1 tablet by mouth daily.   Yes Historical Provider, MD  carvedilol (COREG) 12.5 MG tablet Take 1 tablet (12.5 mg total) by mouth 2 (two) times daily with a meal. 11/10/15  Yes Aleksei Plotnikov V, MD  Fluticasone-Salmeterol (ADVAIR DISKUS) 250-50 MCG/DOSE AEPB Inhale 1 puff into the lungs 2 (two) times daily. 05/12/15  Yes Aleksei Plotnikov V, MD  Multiple Vitamins-Minerals (MULTIVITAMIN PO) Take 1 tablet by mouth daily.    Yes Historical Provider, MD  sacubitril-valsartan (ENTRESTO) 24-26 MG  Take 1 tablet by mouth 2 (two) times daily. 12/10/15  Yes Josue Hector, MD  torsemide (DEMADEX) 20 MG tablet Take 10 mg by mouth daily.   Yes Historical Provider, MD    Past Medical History: Past Medical History  Diagnosis Date  . GOITER, MULTINODULAR 05/06/2010    Dr [name]  . HYPERTHYROIDISM 05/23/2010  . HYPERLIPIDEMIA 08/20/2007  . ANEMIA-NOS 08/20/2007  . ANXIETY 03/16/2007  . CARPAL TUNNEL SYNDROME, RIGHT 01/11/2008  . HYPERTENSION 03/16/2007  . VENOUS INSUFFICIENCY 09/05/2007  . DIVERTICULOSIS, COLON 08/20/2007  . CELLULITIS, LEG,  RIGHT 08/20/2007  . OSTEOARTHRITIS 03/16/2007  . OSTEOPOROSIS 08/20/2007  . SCOLIOSIS 01/11/2008  . BREAST CANCER, HX OF 03/16/2007  . SKIN CANCER, HX OF 11/12/2008     R cheek 2010, L Cheek 2011 Dr. Tonia Brooms  . PREMATURE ATRIAL CONTRACTIONS 09/21/2010  . Thyroid nodule   . Cataract     floaters  . Pneumonia 07/2011    took abx for several weeks  . IBS (irritable bowel syndrome)   . Collagenous colitis     Past Surgical History: Past Surgical History  Procedure Laterality Date  . Total knee arthroplasty    . Breast lumpectomy Left 2001  . Abdominal hysterectomy    . Appendectomy  1972  . Mastectomy, partial Left   . Carpal tunnel release    . Total knee arthroplasty Right 2010    Dr Para March  . Cataract extraction    . Skin cancer excision      face-Dr. Bing Plume  . Xrt      chemo, surgery  . Eye surgery      cataract ext  . Total knee arthroplasty  10/30/2011    Procedure: TOTAL KNEE ARTHROPLASTY;  Surgeon: Lorn Junes, MD;  Location: [facility];  Service: Orthopedics;  Laterality: Left;     . Hernia repair      Family History: Family History  Problem Relation Age of Onset  . Dementia Mother   . Heart disease Mother   . Mental retardation Mother   . Colon cancer Neg Hx   . Hypertension Mother   . Alzheimer's disease Mother   . Prostate cancer Brother   . Diabetes Other   . Heart attack Father   . Alcohol abuse Father   . Anesthesia problems Neg Hx     Social History: Social History   Social History  . Marital Status: Single    Spouse Name: N/A  . Number of Children: N/A  . Years of Education: N/A   Social History Main Topics  . Smoking status: Former Smoker    Quit date: 12/20/1980  . Smokeless tobacco: Never Used  . Alcohol Use: Yes     Comment: socially  . Drug Use: No  . Sexual Activity: Not Currently   Other Topics Concern  . Not on file   Social History Narrative    Allergies:  Allergies  Allergen Reactions  . Cefuroxime Diarrhea  . Celecoxib  Nausea Only  . Codeine Nausea Only  . Deltasone [Prednisone]     Oral deltasone is causing swelling (can use Depo-Medrol)  . Ranitidine     bloating  . Tramadol Hcl Nausea Only    Objective:    Vital Signs:   Temp:  [97.4 F (36.3 C)-97.9 F (36.6 C)] 97.4 F (36.3 C) (04/26 0644) Pulse Rate:  [70-98] 94 (04/26 0644) Resp:  [18-20] 20 (04/26 0644) BP: (82-101)/(50-68) 101/51 mmHg (04/26 0644) SpO2:  [90 %-96 %] 96 % (  04/26 0644) Weight:  [151 lb 6.4 oz (68.675 kg)-153 lb 3.5 oz (69.5 kg)] 151 lb 6.4 oz (68.675 kg) (04/26 0644) Last BM Date: 12/14/15  Weight change: Filed Weights   12/14/15 1337 12/15/15 0644  Weight: 153 lb 3.5 oz (69.5 kg) 151 lb 6.4 oz (68.675 kg)    Intake/Output:   Intake/Output Summary (Last 24 hours) at 12/15/15 0935 Last data filed at 12/15/15 0738  Gross per 24 hour  Intake      0 ml  Output    600 ml  Net   -600 ml     Physical Exam: General: Elderly. Lying in bed. No respiratory difficulty HEENT: normal Neck: supple. JVP appears 7-8 cm. Carotids 2+ bilat; no bruits. No thyromegaly or nodule noted. Cor: PMI nondisplaced. Irregular rate & rhythm. No rubs, gallops or murmurs. Lungs: clear dull at left base Abdomen: soft, NT, ND, no HSM. No bruits or masses. +BS  Extremities: no cyanosis, clubbing, rash, 3+ edema into thighs. Left leg wrapped with gauze due to wound Neuro: alert & oriented x 3, cranial nerves grossly intact. moves all 4 extremities w/o difficulty. Affect pleasant.  Telemetry: Afib  Labs: Basic Metabolic Panel:  Recent Labs Lab 12/14/15 1337 12/15/15 0315  NA 141 141  K 4.9 4.1  CL 99* 101  CO2 29 29  GLUCOSE 115* 109*  BUN 80* 73*  CREATININE 2.24* 1.99*  CALCIUM 9.9 9.0  MG 2.3  --   PHOS 4.3  --     Liver Function Tests:  Recent Labs Lab 12/14/15 1337  AST 28  ALT 28  ALKPHOS 60  BILITOT 1.0  PROT 6.4*  ALBUMIN 3.6   No results for input(s): LIPASE, AMYLASE in the last 168 hours. No results  for input(s): AMMONIA in the last 168 hours.  CBC:  Recent Labs Lab 12/14/15 1337 12/15/15 0315  WBC 8.2 6.4  HGB 11.3* 9.8*  HCT 36.5 31.9*  MCV 87.5 87.4  PLT 277 215    Cardiac Enzymes:  Recent Labs Lab 12/14/15 1337  TROPONINI 0.04*    BNP: BNP (last 3 results)  Recent Labs  12/14/15 1337  BNP 1357.2*    ProBNP (last 3 results)  Recent Labs  12/01/15 1052  PROBNP 1090.0*     CBG: No results for input(s): GLUCAP in the last 168 hours.  Coagulation Studies: No results for input(s): LABPROT, INR in the last 72 hours.  Other results: EKG: 12/09/15 NSR with 1st degree block, 63 bpm -> now AF  Imaging: Dg Chest 2 View  12/15/2015  CLINICAL DATA:  Shortness of breath, congestive heart failure EXAM: CHEST  2 VIEW COMPARISON:  10/20/2015 FINDINGS: Borderline cardiomegaly. Large left retrocardiac diaphragmatic/ hiatal hernia again noted. Persistent mild left basilar atelectasis or scarring. There is dextroscoliosis lower thoracic spine. No infiltrate or pulmonary edema. Surgical clips are noted in left axilla. Atherosclerotic calcifications of thoracic aorta. Mild degenerative changes and osteopenia thoracic spine. IMPRESSION: Large left retrocardiac diaphragmatic/ hiatal hernia again noted. Persistent mild left basilar atelectasis or scarring. There is dextroscoliosis lower thoracic spine. No infiltrate or pulmonary edema. Electronically Signed   By: Lahoma Crocker M.D.   On: 12/15/2015 08:20   US Renal  12/14/2015  CLINICAL DATA:  Patient with congestive heart failure. EXAM: RENAL / URINARY TRACT ULTRASOUND COMPLETE COMPARISON:  MRI abdomen 12/13/2006 FINDINGS: Right Kidney: Length: 9.0 cm. Echogenicity within normal limits. No mass or hydronephrosis visualized. Left Kidney: Length: 9.9 cm. Echogenicity within normal limits. No mass or  hydronephrosis visualized. Bladder: Decompressed and poorly evaluated. IMPRESSION: No hydronephrosis. Electronically Signed   By: Lovey Newcomer M.D.   On: 12/14/2015 18:33      Medications:     Current Medications: . aspirin  325 mg Oral Daily  . carvedilol  12.5 mg Oral BID WC  . furosemide  40 mg Intravenous Q12H  . heparin  5,000 Units Subcutaneous Q8H  . mometasone-formoterol  2 puff Inhalation BID  . sodium chloride flush  3 mL Intravenous Q12H  . sodium chloride flush  3 mL Intravenous Q12H     Infusions:      Assessment   1. Severe lower edema 2. Acute on chronic systolic HF with R>>L symptoms 3. Acute on chronic renal failure, stage 4 4. LE wounds 5. H/o COPD 6. H/o breast cancer s/p chemo 2000 7. A fib  Plan    Echo pending. UA negative.   Renal US negative.   Tele shows Afib.  No hx of this. Will get EKG. This patients CHA2DS2-VASc Score and unadjusted Ischemic Stroke Rate (% per year) is equal to AT LEAST 4.8 % stroke rate/year from a score of 4  Creatinine slightly better with diuresis and holding of Entresto. May be able to investigate possibility of underlying ICM via heart cath if continues to improve.    Continue IV lasix 40 mg BID. K stable.   BP much improved.   LE wounds per primary.   Length of Stay: 1  Shirley Friar PA-C 12/15/2015, 9:35 AM  Advanced Heart Failure Team Pager [phone] (M-F; 7a - 4p)  Please contact [facility] for night-coverage after hours (4p -7a ) and weekends on [email or web address]   Patient seen and examined with Oda Kilts, PA-C. We discussed all aspects of the encounter. I agree with the assessment and plan as stated above.   She is diuresing well with IV lasix. Renal function improved. Will continue IV lasix. Renal u/s and UA reviewed personally and these are normal. Suspect main issue may just be HF. She has apparently new onset AF with good rate control. Will start Eliquis and stop ASA. So far appears to be tolerating AF. ECG suggestive of ischemic CM but not sure if she is good cath candidate at this point - will see how course goes  particularly with renal function. Consult PT.   Broly Hatfield,MD 10:14 PM

## 2015-12-15 NOTE — Evaluation (Signed)
Occupational Therapy Evaluation Patient Details Name: Mia Contreras MRN: [id number] DOB: December 13, 1928 Today's Date: 12/15/2015    History of Present Illness pt presents with Acute on Chronic CHF.  pt with hx of CHF, Thyroid Dysfunction, Anemia, HTN, Venous Insufficiency, Scoliosis, Breast CA, Skin CA, Bil TKR, Partial Mastectomy, and Carpal Tunnel Release.     Clinical Impression   Pt is independent in ADL and IADL at her baseline. She uses a walker. She presents with poor activity tolerance and generalized weakness negatively impacting her ability to perform self care. Will follow acutely.    Follow Up Recommendations  Home health OT , Home Health Aide   Equipment Recommendations       Recommendations for Other Services       Precautions / Restrictions Precautions Precautions: Fall Restrictions Weight Bearing Restrictions: No      Mobility Bed Mobility Overal bed mobility: Needs Assistance Bed Mobility: Supine to Sit;Sit to Supine     Supine to sit: Supervision Sit to supine: Min assist   General bed mobility comments: assist for LEs back into bed  Transfers Overall transfer level: Needs assistance Equipment used: Rolling walker (2 wheeled) Transfers: Sit to/from Stand Sit to Stand: Supervision         General transfer comment: stands momentarily prior to ambulating    Balance Overall balance assessment: Needs assistance Sitting-balance support: No upper extremity supported;Feet supported Sitting balance-Leahy Scale: Good     Standing balance support: No upper extremity supported Standing balance-Leahy Scale: Fair                              ADL Overall ADL's : Needs assistance/impaired Eating/Feeding: Independent   Grooming: Wash/dry hands;Supervision/safety;Standing   Upper Body Bathing: Set up;Sitting   Lower Body Bathing: Supervison/ safety;Sit to/from stand   Upper Body Dressing : Set up;Sitting   Lower Body Dressing:  Supervision/safety;Sit to/from stand   Toilet Transfer: Supervision/safety;Ambulation;Comfort height toilet;RW   Toileting- Clothing Manipulation and Hygiene: Minimal assistance       Functional mobility during ADLs: Supervision/safety;Rolling walker General ADL Comments: pt with decreased activity tolerance     Vision     Perception     Praxis      Pertinent Vitals/Pain Pain Assessment: No/denies pain Faces Pain Scale: Hurts a little bit Pain Location: pt indicates her knees are just stiff. Pain Descriptors / Indicators: Tightness Pain Intervention(s): Monitored during session;Repositioned     Hand Dominance Right   Extremity/Trunk Assessment Upper Extremity Assessment Upper Extremity Assessment: Defer to OT evaluation   Lower Extremity Assessment Lower Extremity Assessment: Generalized weakness   Cervical / Trunk Assessment Cervical / Trunk Assessment: Kyphotic   Communication Communication Communication: No difficulties   Cognition Arousal/Alertness: Awake/alert Behavior During Therapy: WFL for tasks assessed/performed Overall Cognitive Status: Within Functional Limits for tasks assessed                     General Comments       Exercises       Shoulder Instructions      Home Living Family/patient expects to be discharged to:: Private residence Living Arrangements: Alone Available Help at Discharge: Friend(s);Available PRN/intermittently Type of Home:  (townhome) Home Access: Stairs to enter [employer] of Steps: 1 Entrance Stairs-Rails: None Home Layout: One level     Bathroom Shower/Tub: Occupational psychologist: Handicapped height     Home Equipment: Environmental consultant - 2 wheels  Prior Functioning/Environment Level of Independence: Independent with assistive device(s)             OT Diagnosis: Generalized weakness   OT Problem List: Decreased strength;Decreased activity tolerance;Impaired balance  (sitting and/or standing)   OT Treatment/Interventions: Self-care/ADL training;Energy conservation;DME and/or AE instruction;Patient/family education;Balance training    OT Goals(Current goals can be found in the care plan section) Acute Rehab OT Goals Patient Stated Goal: Home OT Goal Formulation: With patient Time For Goal Achievement: 12/22/15 Potential to Achieve Goals: Good ADL Goals Pt Will Perform Grooming: with modified independence;standing Pt Will Perform Lower Body Bathing: with modified independence;sit to/from stand Pt Will Perform Lower Body Dressing: with modified independence;sit to/from stand Pt Will Transfer to Toilet: with modified independence;ambulating Pt Will Perform Toileting - Clothing Manipulation and hygiene: with modified independence;sit to/from stand Additional ADL Goal #1: Pt will utilize energy conservation strategies in ADL and mobility with minimal verbal cues.  OT Frequency: Min 2X/week   Barriers to D/C: Decreased caregiver support          Co-evaluation              End of Session Equipment Utilized During Treatment: Rolling walker;Gait belt  Activity Tolerance: Patient limited by fatigue Patient left: in bed;with call bell/phone within reach;with family/visitor present   Time: [id number] OT Time Calculation (min): 19 min Charges:  OT General Charges $OT Visit: 1 Procedure OT Evaluation $OT Eval Low Complexity: 1 Procedure G-Codes:    Malka So 12/15/2015, 3:56 PM  [phone]

## 2015-12-15 NOTE — Progress Notes (Signed)
[facility] PROGRESS NOTE  Mia Contreras [id number] DOB: [birth year] DOA: 12/14/2015   PCP: Walker Kehr, MD     Assessment/Plan: Active Problems:   hx: breast cancer, left, infiltrating ductal   Leg wound, left   Acute on chronic combined systolic and diastolic heart failure (HCC)   Acute kidney injury (HCC)   CKD (chronic kidney disease) stage 2, GFR 60-89 ml/min   Anxiety   IBS (irritable bowel syndrome)   82 y.o. female with medical history significant of thyroid dysfunction, HLD, anemia, HTN, venous insufficiency, chronic left leg wound, scoliosis, breast cancer, PAC, IBS presenting to heart failure clinic for initial intake examination. During visit it became a apparent that over the past 4 weeks patient has developed significant decompensation from a cardiac standpoint as well as some worsening renal function. Of note patient states she's had about 4 week history of progressive lower extremity edema extending from her ankles to her thighs. Patient states that this all started after she was treated for bronchitis with a Z-Pak and steroids. Patient has continued to take her torsemide 10 mg twice a day without any improvement in her urine output. Patient states make urine.  Assessment and plan Acute on chronic combined systolic and diastolic congestive heart failure. Echo from 2016 shows EF of 30% and grade 1 diastolic dysfunction. Patient with four-week history of weight gain. Currently 15 pounds above dry weight of 138 pounds. BNP from labs drawn on 12/01/2015 greater than 1000. Patient has not increased her home torsemide dose of 10 mg twice a day. Seen by Dr. Haroldine Laws in HF clinic for intake exam and admitted directly from clinic. HF team to follow - Tele - continue IV lasix 40 BID - Repeat Echo - strict I/O, Dly wts - CHF order set utilized.  Creatinine slightly better with diuresis and holding of Entresto. May be able to investigate possibility of  underlying ICM via heart cath if continues to improve - Continue coreg  AoCKD: Cr 2.47>1.99 improving with diuresis. Baseline 1.1. Suspect from cardiorenal syndrome and minimally from chronic diuretic use. FENA would not be beneficial given active diuresis. Serum protein nml and multiple urine protein tests nml in past.  - Renal US negative - BMET in am    LLE wound: skin tear w/ [place] injury. Present for 7 weeks. Excellent outpt f/u at wound clinic. Last bandage change 1 day ago.  - Do not change dressing unless becomes symptomatic - Treatment of CHF as above will help w/ leg wound healing - prealbumin and nutrition consult - Pt/Ot (Numerous falls and LE wounds in past from running into household items. May need higher level of care at home/facility)  Anxiety: - continue xanax  Asthma/COPD: - continue advair  IBS: - continue Lomotil  L Breast Ca: s/p chemo 2008. No recurrence    DVT prophylaxsis heparin  Code Status:  Full code    Family Communication: Discussed in detail with the patient, all imaging results, lab results explained to the patient   Disposition Plan:  Anticipate discharge as per cardiology recommendations    Consultants:  None  Procedures:  None  Antibiotics: Anti-infectives    None         HPI/Subjective: Says her breathing and swelling feel a little bit better, making lots of urine   Objective: Filed Vitals:   12/14/15 1337 12/14/15 2051 12/14/15 2152 12/15/15 0644  BP: 95/68 82/50  101/51  Pulse: 98 77 70 94  Temp: 97.9  F (36.6 C) 97.6 F (36.4 C)  97.4 F (36.3 C)  TempSrc: Oral Oral  Oral  Resp:  18 18 20   Height: 5\' 3"  (1.6 m)     Weight: 69.5 kg (153 lb 3.5 oz)   68.675 kg (151 lb 6.4 oz)  SpO2: 94% 92% 90% 96%    Intake/Output Summary (Last 24 hours) at 12/15/15 1253 Last data filed at 12/15/15 0738  Gross per 24 hour  Intake      0 ml  Output    600 ml  Net   -600 ml    Exam:  Examination:  General exam:  Appears calm and comfortable  Respiratory system: Clear to auscultation. Respiratory effort normal. Cardiovascular system: S1 & S2 heard, RRR. No JVD, murmurs, rubs, gallops or clicks. No pedal edema. Gastrointestinal system: Abdomen is nondistended, soft and nontender. No organomegaly or masses felt. Normal bowel sounds heard. Central nervous system: Alert and oriented. No focal neurological deficits. Extremities: no cyanosis, clubbing, rash, 3-4+ edema into thighs. Left leg wrapped with gauze due to wound Skin: No rashes, lesions or ulcers Psychiatry: Judgement and insight appear normal. Mood & affect appropriate.     Data Reviewed: I have personally reviewed following labs and imaging studies  Micro Results No results found for this or any previous visit (from the past 240 hour(s)).  Radiology Reports Dg Chest 2 View  12/15/2015  CLINICAL DATA:  Shortness of breath, congestive heart failure EXAM: CHEST  2 VIEW COMPARISON:  10/20/2015 FINDINGS: Borderline cardiomegaly. Large left retrocardiac diaphragmatic/ hiatal hernia again noted. Persistent mild left basilar atelectasis or scarring. There is dextroscoliosis lower thoracic spine. No infiltrate or pulmonary edema. Surgical clips are noted in left axilla. Atherosclerotic calcifications of thoracic aorta. Mild degenerative changes and osteopenia thoracic spine. IMPRESSION: Large left retrocardiac diaphragmatic/ hiatal hernia again noted. Persistent mild left basilar atelectasis or scarring. There is dextroscoliosis lower thoracic spine. No infiltrate or pulmonary edema. Electronically Signed   By: Lahoma Crocker M.D.   On: 12/15/2015 08:20   US Renal  12/14/2015  CLINICAL DATA:  Patient with congestive heart failure. EXAM: RENAL / URINARY TRACT ULTRASOUND COMPLETE COMPARISON:  MRI abdomen 12/13/2006 FINDINGS: Right Kidney: Length: 9.0 cm. Echogenicity within normal limits. No mass or hydronephrosis visualized. Left Kidney: Length: 9.9 cm.  Echogenicity within normal limits. No mass or hydronephrosis visualized. Bladder: Decompressed and poorly evaluated. IMPRESSION: No hydronephrosis. Electronically Signed   By: Lovey Newcomer M.D.   On: 12/14/2015 18:33     CBC  Recent Labs Lab 12/14/15 1337 12/15/15 0315  WBC 8.2 6.4  HGB 11.3* 9.8*  HCT 36.5 31.9*  PLT 277 215  MCV 87.5 87.4  MCH 27.1 26.8  MCHC 31.0 30.7  RDW 15.2 15.3    Chemistries   Recent Labs Lab 12/14/15 1337 12/15/15 0315  NA 141 141  K 4.9 4.1  CL 99* 101  CO2 29 29  GLUCOSE 115* 109*  BUN 80* 73*  CREATININE 2.24* 1.99*  CALCIUM 9.9 9.0  MG 2.3  --   AST 28  --   ALT 28  --   ALKPHOS 60  --   BILITOT 1.0  --    ------------------------------------------------------------------------------------------------------------------ estimated creatinine clearance is 19.6 mL/min (by C-G formula based on Cr of 1.99). ------------------------------------------------------------------------------------------------------------------ No results for input(s): HGBA1C in the last 72 hours. ------------------------------------------------------------------------------------------------------------------ No results for input(s): CHOL, HDL, LDLCALC, TRIG, CHOLHDL, LDLDIRECT in the last 72 hours. ------------------------------------------------------------------------------------------------------------------  Recent Labs  12/14/15 1337  TSH 2.773   ------------------------------------------------------------------------------------------------------------------  No results for input(s): VITAMINB12, FOLATE, FERRITIN, TIBC, IRON, RETICCTPCT in the last 72 hours.  Coagulation profile No results for input(s): INR, PROTIME in the last 168 hours.  No results for input(s): DDIMER in the last 72 hours.  Cardiac Enzymes  Recent Labs Lab 12/14/15 1337  TROPONINI 0.04*    ------------------------------------------------------------------------------------------------------------------ Invalid input(s): POCBNP   CBG: No results for input(s): GLUCAP in the last 168 hours.     Studies: Dg Chest 2 View  12/15/2015  CLINICAL DATA:  Shortness of breath, congestive heart failure EXAM: CHEST  2 VIEW COMPARISON:  10/20/2015 FINDINGS: Borderline cardiomegaly. Large left retrocardiac diaphragmatic/ hiatal hernia again noted. Persistent mild left basilar atelectasis or scarring. There is dextroscoliosis lower thoracic spine. No infiltrate or pulmonary edema. Surgical clips are noted in left axilla. Atherosclerotic calcifications of thoracic aorta. Mild degenerative changes and osteopenia thoracic spine. IMPRESSION: Large left retrocardiac diaphragmatic/ hiatal hernia again noted. Persistent mild left basilar atelectasis or scarring. There is dextroscoliosis lower thoracic spine. No infiltrate or pulmonary edema. Electronically Signed   By: Lahoma Crocker M.D.   On: 12/15/2015 08:20   US Renal  12/14/2015  CLINICAL DATA:  Patient with congestive heart failure. EXAM: RENAL / URINARY TRACT ULTRASOUND COMPLETE COMPARISON:  MRI abdomen 12/13/2006 FINDINGS: Right Kidney: Length: 9.0 cm. Echogenicity within normal limits. No mass or hydronephrosis visualized. Left Kidney: Length: 9.9 cm. Echogenicity within normal limits. No mass or hydronephrosis visualized. Bladder: Decompressed and poorly evaluated. IMPRESSION: No hydronephrosis. Electronically Signed   By: Lovey Newcomer M.D.   On: 12/14/2015 18:33      Lab Results  Component Value Date   HGBA1C 5.8 02/06/2014   HGBA1C 5.6 09/08/2009   Lab Results  Component Value Date   MICROALBUR 0.6 03/23/2011   LDLCALC 60 11/04/2015   CREATININE 1.99* 12/15/2015       Scheduled Meds: . aspirin  325 mg Oral Daily  . carvedilol  12.5 mg Oral BID WC  . feeding supplement (ENSURE ENLIVE)  237 mL Oral BID BM  . furosemide  40 mg  Intravenous Q12H  . heparin  5,000 Units Subcutaneous Q8H  . mometasone-formoterol  2 puff Inhalation BID  . sodium chloride flush  3 mL Intravenous Q12H  . sodium chloride flush  3 mL Intravenous Q12H   Continuous Infusions:    LOS: 1 day    Time spent: >30 MINS    [facility]  [facility] Pager [phone]. If 7PM-7AM, please contact night-coverage at [email or web address], password [email or web address] 12/15/2015, 12:53 PM  LOS: 1 day

## 2015-12-15 NOTE — Telephone Encounter (Signed)
Just an FYI:  Mia Contreras put a referral in for nephrology. I have put your name on the referral form I'm sending to [facility]. The wanted an MD name on it.

## 2015-12-15 NOTE — Progress Notes (Signed)
  Echocardiogram 2D Echocardiogram has been performed.  Mia Contreras 12/15/2015, 6:12 PM

## 2015-12-15 NOTE — Consult Note (Signed)
WOC wound consult note Reason for Consult: LE wounds Patient is follow by the [facility] wound care center weekly.  I have discussed her POC with the wound care center today.  She just have her compression wrap placed Monday 12/13/15 and is due and scheduled with Dr. Dellia Nims next Monday. They are currently using calcium alginate, silver, ABD pads and Profore Light (3 layer) on her LLE. She use Juxta light compression wrap with a compression sock on the RLE. She dons her compression garment on the RLE in the am and removes at bedtime.  It is noted in the admitting MD H& P as well to leave dressing on the LLE unless symptomatic.  Wound type: did not assess today Dressing procedure/placement/frequency: LLE compression and topical care not due again until Monday May 1st.  Nursing staff to assist patient daily with donning and removal of her compression sock and wrap to the RLE.  Patient agreeable with this plan.   [place] team will remain available to you, will follow up if patient still inpatient on Monday for LLE compression wrap change.  [facility] RN,CWOCN [phone]

## 2015-12-16 DIAGNOSIS — N184 Chronic kidney disease, stage 4 (severe): Secondary | ICD-10-CM

## 2015-12-16 DIAGNOSIS — I5031 Acute diastolic (congestive) heart failure: Secondary | ICD-10-CM

## 2015-12-16 LAB — COMPREHENSIVE METABOLIC PANEL
ALBUMIN: 3 g/dL — AB (ref 3.5–5.0)
ALK PHOS: 48 U/L (ref 38–126)
ALT: 24 U/L (ref 14–54)
AST: 26 U/L (ref 15–41)
Anion gap: 12 (ref 5–15)
BUN: 64 mg/dL — ABNORMAL HIGH (ref 6–20)
CALCIUM: 8.7 mg/dL — AB (ref 8.9–10.3)
CO2: 28 mmol/L (ref 22–32)
CREATININE: 1.81 mg/dL — AB (ref 0.44–1.00)
Chloride: 101 mmol/L (ref 101–111)
GFR calc Af Amer: 28 mL/min — ABNORMAL LOW (ref >60)
GFR calc non Af Amer: 25 mL/min — ABNORMAL LOW (ref >60)
GLUCOSE: 106 mg/dL — AB (ref 65–99)
Potassium: 4 mmol/L (ref 3.5–5.1)
SODIUM: 141 mmol/L (ref 135–145)
Total Bilirubin: 0.5 mg/dL (ref 0.3–1.2)
Total Protein: 5.2 g/dL — ABNORMAL LOW (ref 6.5–8.1)

## 2015-12-16 LAB — CBC
HEMATOCRIT: 32.5 % — AB (ref 36.0–46.0)
HEMOGLOBIN: 10 g/dL — AB (ref 12.0–15.0)
MCH: 27.1 pg (ref 26.0–34.0)
MCHC: 30.8 g/dL (ref 30.0–36.0)
MCV: 88.1 fL (ref 78.0–100.0)
Platelets: 213 10*3/uL (ref 150–400)
RBC: 3.69 MIL/uL — ABNORMAL LOW (ref 3.87–5.11)
RDW: 15.4 % (ref 11.5–15.5)
WBC: 6.6 10*3/uL (ref 4.0–10.5)

## 2015-12-16 MED ORDER — METOLAZONE 2.5 MG PO TABS: 2.5000 mg | ORAL_TABLET | Freq: Two times a day (BID) | ORAL | Status: DC

## 2015-12-16 MED ORDER — METOLAZONE 2.5 MG PO TABS
2.5000 mg | ORAL_TABLET | Freq: Every day | ORAL | Status: DC
  Administered 2015-12-16 – 2015-12-17 (×2): 2.5 mg via ORAL
  Filled 2015-12-16 (×2): qty 1

## 2015-12-16 MED ORDER — FUROSEMIDE 10 MG/ML IJ SOLN
80.0000 mg | Freq: Two times a day (BID) | INTRAMUSCULAR | Status: DC
  Administered 2015-12-16 – 2015-12-17 (×4): 80 mg via INTRAVENOUS
  Filled 2015-12-16 (×4): qty 8

## 2015-12-16 MED ORDER — METOLAZONE 2.5 MG PO TABS: 2.5000 mg | ORAL_TABLET | Freq: Every day | ORAL | Status: DC

## 2015-12-16 MED ORDER — POTASSIUM CHLORIDE ER 10 MEQ PO TBCR
20.0000 meq | EXTENDED_RELEASE_TABLET | Freq: Two times a day (BID) | ORAL | Status: DC
  Administered 2015-12-16 – 2015-12-19 (×7): 20 meq via ORAL
  Filled 2015-12-16 (×15): qty 2

## 2015-12-16 NOTE — Telephone Encounter (Signed)
Addressed at office visit 4/21

## 2015-12-16 NOTE — Progress Notes (Signed)
Advanced Heart Failure Rounding Note    HPI:    Mia Contreras is a 82 y.o. female with a history of nonischemic cardiomyopathy, systolic CHF, CKD 3-4, L Breast cancer in 2000 admitted with CHF  Remains on IV lasix. I/Os inaccurate. Weight down a pound. Breathing ok. Remains in AF 80-90. Creatinine better.   Objective:    Vital Signs:   Temp:  [97.7 F (36.5 C)] 97.7 F (36.5 C) (04/27 0646) Pulse Rate:  [85-89] 86 (04/27 0732) Resp:  [16-20] 18 (04/27 0732) BP: (81-106)/(50-62) 106/62 mmHg (04/27 0646) SpO2:  [91 %-93 %] 92 % (04/27 0732) Weight:  [68.266 kg (150 lb 8 oz)] 68.266 kg (150 lb 8 oz) (04/27 0646) Last BM Date: 12/15/15  Weight change: Filed Weights   12/14/15 1337 12/15/15 0644 12/16/15 0646  Weight: 69.5 kg (153 lb 3.5 oz) 68.675 kg (151 lb 6.4 oz) 68.266 kg (150 lb 8 oz)    Intake/Output:   Intake/Output Summary (Last 24 hours) at 12/16/15 0818 Last data filed at 12/15/15 1350  Gross per 24 hour  Intake    240 ml  Output      0 ml  Net    240 ml     Physical Exam: General: Elderly. Lying in bed. No respiratory difficulty HEENT: normal Neck: supple. JVP to jaw. Carotids 2+ bilat; no bruits. No thyromegaly or nodule noted. Cor: PMI nondisplaced. Irregular rate & rhythm. No rubs, gallops or murmurs. Lungs: clear dull at left base Abdomen: soft, NT, ND, no HSM. No bruits or masses. +BS  Extremities: no cyanosis, clubbing, rash, 2-3+ edema into thighs. Left leg wrapped with gauze due to wound Neuro: alert & oriented x 3, cranial nerves grossly intact. moves all 4 extremities w/o difficulty. Affect pleasant.  Telemetry: Afib 80-90s (reviewed personally)  Labs: Basic Metabolic Panel:  Recent Labs Lab 12/14/15 1337 12/15/15 0315 12/16/15 0246  NA 141 141 141  K 4.9 4.1 4.0  CL 99* 101 101  CO2 29 29 28   GLUCOSE 115* 109* 106*  BUN 80* 73* 64*  CREATININE 2.24* 1.99* 1.81*  CALCIUM 9.9 9.0 8.7*  MG 2.3  --   --   PHOS 4.3  --   --       Liver Function Tests:  Recent Labs Lab 12/14/15 1337 12/16/15 0246  AST 28 26  ALT 28 24  ALKPHOS 60 48  BILITOT 1.0 0.5  PROT 6.4* 5.2*  ALBUMIN 3.6 3.0*   No results for input(s): LIPASE, AMYLASE in the last 168 hours. No results for input(s): AMMONIA in the last 168 hours.  CBC:  Recent Labs Lab 12/14/15 1337 12/15/15 0315 12/16/15 0246  WBC 8.2 6.4 6.6  HGB 11.3* 9.8* 10.0*  HCT 36.5 31.9* 32.5*  MCV 87.5 87.4 88.1  PLT 277 215 213    Cardiac Enzymes:  Recent Labs Lab 12/14/15 1337  TROPONINI 0.04*    BNP: BNP (last 3 results)  Recent Labs  12/14/15 1337  BNP 1357.2*    ProBNP (last 3 results)  Recent Labs  12/01/15 1052  PROBNP 1090.0*     CBG: No results for input(s): GLUCAP in the last 168 hours.  Coagulation Studies: No results for input(s): LABPROT, INR in the last 72 hours.  Other results: EKG: 12/09/15 NSR with 1st degree block, 63 bpm -> now AF  Imaging: Dg Chest 2 View  12/15/2015  CLINICAL DATA:  Shortness of breath, congestive heart failure EXAM: CHEST  2 VIEW COMPARISON:  10/20/2015 FINDINGS: Borderline cardiomegaly. Large left retrocardiac diaphragmatic/ hiatal hernia again noted. Persistent mild left basilar atelectasis or scarring. There is dextroscoliosis lower thoracic spine. No infiltrate or pulmonary edema. Surgical clips are noted in left axilla. Atherosclerotic calcifications of thoracic aorta. Mild degenerative changes and osteopenia thoracic spine. IMPRESSION: Large left retrocardiac diaphragmatic/ hiatal hernia again noted. Persistent mild left basilar atelectasis or scarring. There is dextroscoliosis lower thoracic spine. No infiltrate or pulmonary edema. Electronically Signed   By: Lahoma Crocker M.D.   On: 12/15/2015 08:20   US Renal  12/14/2015  CLINICAL DATA:  Patient with congestive heart failure. EXAM: RENAL / URINARY TRACT ULTRASOUND COMPLETE COMPARISON:  MRI abdomen 12/13/2006 FINDINGS: Right Kidney: Length:  9.0 cm. Echogenicity within normal limits. No mass or hydronephrosis visualized. Left Kidney: Length: 9.9 cm. Echogenicity within normal limits. No mass or hydronephrosis visualized. Bladder: Decompressed and poorly evaluated. IMPRESSION: No hydronephrosis. Electronically Signed   By: Lovey Newcomer M.D.   On: 12/14/2015 18:33     Medications:     Current Medications: . apixaban  2.5 mg Oral BID  . carvedilol  12.5 mg Oral BID WC  . feeding supplement (ENSURE ENLIVE)  237 mL Oral BID BM  . furosemide  80 mg Intravenous Q12H  . mometasone-formoterol  2 puff Inhalation BID  . sodium chloride flush  3 mL Intravenous Q12H  . sodium chloride flush  3 mL Intravenous Q12H  . spironolactone  12.5 mg Oral Daily    Infusions:     Assessment   1. Acute on chronic systolic HF with R>>L symptoms 2. Acute on chronic renal failure, stage 4 3. LE wounds 4. H/o COPD 5. H/o breast cancer s/p chemo 2000 6. A fib, new onset    -CHADSVASC 5  Plan    Sluggish diuresis with IV lasix. Renal function improved. Will continue IV lasix. Add metolazone. Renal u/s and UA reviewed personally and these are normal. Having trouble getting out of bed to go to bathroom. Will place Foley for 48 hours.   She has new onset AF with good rate control. Eliquis started 4/26. So far appears to be tolerating AF. ECG suggestive of ischemic CM but not sure if she is good cath candidate at this point - will see how course goes particularly with renal function. PT following recommend HHPT  LEs wrapped for wounds  Bensimhon, Daniel,MD 8:18 AM Advanced Heart Failure Team Pager [phone] (M-F; 7a - 4p)  Please contact [facility] for night-coverage after hours (4p -7a ) and weekends on [email or web address]

## 2015-12-16 NOTE — Telephone Encounter (Signed)
Mia Contreras from [facility] called stating her priority is a 1-2 and Dr. Clover Mealy wants her to stop taking Cozaar. I do not see this on her med list and it does appear she is currently hospitalized.  They will make her appointment as soon as they can get her in.

## 2015-12-16 NOTE — Telephone Encounter (Signed)
Thx AP

## 2015-12-16 NOTE — Progress Notes (Signed)
[facility] PROGRESS NOTE  Mia Contreras [id number] DOB: [birth year] DOA: 12/14/2015   PCP: Walker Kehr, MD     Assessment/Plan: Active Problems:   hx: breast cancer, left, infiltrating ductal   Leg wound, left   Acute on chronic combined systolic and diastolic heart failure (HCC)   Acute kidney injury (HCC)   CKD (chronic kidney disease) stage 2, GFR 60-89 ml/min   Anxiety   IBS (irritable bowel syndrome)   Acute CHF ([place])   82 y.o. female with medical history significant of thyroid dysfunction, HLD, anemia, HTN, venous insufficiency, chronic left leg wound, scoliosis, breast cancer, PAC, IBS presenting to heart failure clinic for initial intake examination. During visit it became a apparent that over the past 4 weeks patient has developed significant decompensation from a cardiac standpoint as well as some worsening renal function. Of note patient states she's had about 4 week history of progressive lower extremity edema extending from her ankles to her thighs. Patient states that this all started after she was treated for bronchitis with a Z-Pak and steroids. Patient has continued to take her torsemide 10 mg twice a day without any improvement in her urine output. Patient states make urine.  Assessment and plan Acute on chronic combined systolic and diastolic congestive heart failure. Echo from 2016 shows EF of 30% and grade 1 diastolic dysfunction. Patient with four-week history of weight gain. Currently 15 pounds above dry weight of 138 pounds. BNP from labs drawn on 12/01/2015 greater than 1000. Patient has not increased her home torsemide dose of 10 mg twice a day. Seen by Dr. Haroldine Laws in HF clinic for intake exam and admitted directly from clinic. HF team to follow - Tele  Shows atrial fibrillation - continue IV lasix 40 BID , cardiology added  Zaroxolyn - Repeat Echo  pending - strict I/O, D[place] wts - CHF order set utilized.  Creatinine slightly better with  diuresis and holding of Entresto. May be able to investigate possibility of underlying ICM via heart cath if continues to improve - Continue coreg   atrial fibrillation- new onset, started on Eliquis  4/26 , CHADSVASC 5  AoCKD: Cr 2.47>1.99> 1.81  improving with diuresis. Baseline 1.1. Suspect from cardiorenal syndrome and minimally from chronic diuretic use .  - Renal US negative - BMET in am    LLE wound: skin tear w/ Cassia injury. Present for 7 weeks. Excellent outpt f/u at wound clinic. Last bandage change 1 day ago.  - Do not change dressing unless becomes symptomatic - Treatment of CHF as above will help w/ leg wound healing - prealbumin and nutrition consult - Pt/Ot (Numerous falls and LE wounds in past from running into household items. May need higher level of care at home/facility)  Anxiety: - continue xanax  Asthma/COPD: - continue advair  IBS: - continue Lomotil  L Breast Ca: s/p chemo 2008. No recurrence    DVT prophylaxsis heparin  Code Status:  Full code    Family Communication: Discussed in detail with the patient, all imaging results, lab results explained to the patient   Disposition Plan:  Anticipate discharge as per cardiology recommendations    Consultants:  None  Procedures:  None  Antibiotics: Anti-infectives    None         HPI/Subjective: Denies any chest pain or shortness of breath, complaining of feeling cold, chills, no fever  Objective: Filed Vitals:   12/15/15 1937 12/15/15 2201 12/16/15 [id number] 12/16/15 0732  BP: 96/52  106/62   Pulse: 89  85 86  Temp: 97.7 F (36.5 C)  97.7 F (36.5 C)   TempSrc: Oral  Oral   Resp: 18  16 18   Height:      Weight:   68.266 kg (150 lb 8 oz)   SpO2: 92% 91% 93% 92%    Intake/Output Summary (Last 24 hours) at 12/16/15 [id number] Last data filed at 12/15/15 1350  Gross per 24 hour  Intake    240 ml  Output      0 ml  Net    240 ml    Exam:  Examination:  General exam: Appears calm  and comfortable  Respiratory system: Clear to auscultation. Respiratory effort normal. Cardiovascular system: S1 & S2 heard, RRR. No JVD, murmurs, rubs, gallops or clicks. No pedal edema. Gastrointestinal system: Abdomen is nondistended, soft and nontender. No organomegaly or masses felt. Normal bowel sounds heard. Central nervous system: Alert and oriented. No focal neurological deficits. Extremities: no cyanosis, clubbing, rash, 3-4+ edema into thighs. Left leg wrapped with gauze due to wound Skin: No rashes, lesions or ulcers Psychiatry: Judgement and insight appear normal. Mood & affect appropriate.     Data Reviewed: I have personally reviewed following labs and imaging studies  Micro Results No results found for this or any previous visit (from the past 240 hour(s)).  Radiology Reports Dg Chest 2 View  12/15/2015  CLINICAL DATA:  Shortness of breath, congestive heart failure EXAM: CHEST  2 VIEW COMPARISON:  10/20/2015 FINDINGS: Borderline cardiomegaly. Large left retrocardiac diaphragmatic/ hiatal hernia again noted. Persistent mild left basilar atelectasis or scarring. There is dextroscoliosis lower thoracic spine. No infiltrate or pulmonary edema. Surgical clips are noted in left axilla. Atherosclerotic calcifications of thoracic aorta. Mild degenerative changes and osteopenia thoracic spine. IMPRESSION: Large left retrocardiac diaphragmatic/ hiatal hernia again noted. Persistent mild left basilar atelectasis or scarring. There is dextroscoliosis lower thoracic spine. No infiltrate or pulmonary edema. Electronically Signed   By: Lahoma Crocker M.D.   On: 12/15/2015 08:20   US Renal  12/14/2015  CLINICAL DATA:  Patient with congestive heart failure. EXAM: RENAL / URINARY TRACT ULTRASOUND COMPLETE COMPARISON:  MRI abdomen 12/13/2006 FINDINGS: Right Kidney: Length: 9.0 cm. Echogenicity within normal limits. No mass or hydronephrosis visualized. Left Kidney: Length: 9.9 cm. Echogenicity within  normal limits. No mass or hydronephrosis visualized. Bladder: Decompressed and poorly evaluated. IMPRESSION: No hydronephrosis. Electronically Signed   By: Lovey Newcomer M.D.   On: 12/14/2015 18:33     CBC  Recent Labs Lab 12/14/15 1337 12/15/15 0315 12/16/15 0246  WBC 8.2 6.4 6.6  HGB 11.3* 9.8* 10.0*  HCT 36.5 31.9* 32.5*  PLT 277 215 213  MCV 87.5 87.4 88.1  MCH 27.1 26.8 27.1  MCHC 31.0 30.7 30.8  RDW 15.2 15.3 15.4    Chemistries   Recent Labs Lab 12/14/15 1337 12/15/15 0315 12/16/15 0246  NA 141 141 141  K 4.9 4.1 4.0  CL 99* 101 101  CO2 29 29 28   GLUCOSE 115* 109* 106*  BUN 80* 73* 64*  CREATININE 2.24* 1.99* 1.81*  CALCIUM 9.9 9.0 8.7*  MG 2.3  --   --   AST 28  --  26  ALT 28  --  24  ALKPHOS 60  --  48  BILITOT 1.0  --  0.5   ------------------------------------------------------------------------------------------------------------------ estimated creatinine clearance is 21.5 mL/min (by C-G formula based on Cr of 1.81). ------------------------------------------------------------------------------------------------------------------ No results for input(s): HGBA1C  in the last 72 hours. ------------------------------------------------------------------------------------------------------------------ No results for input(s): CHOL, HDL, LDLCALC, TRIG, CHOLHDL, LDLDIRECT in the last 72 hours. ------------------------------------------------------------------------------------------------------------------  Recent Labs  12/14/15 1337  TSH 2.773   ------------------------------------------------------------------------------------------------------------------ No results for input(s): VITAMINB12, FOLATE, FERRITIN, TIBC, IRON, RETICCTPCT in the last 72 hours.  Coagulation profile No results for input(s): INR, PROTIME in the last 168 hours.  No results for input(s): DDIMER in the last 72 hours.  Cardiac Enzymes  Recent Labs Lab 12/14/15 1337   TROPONINI 0.04*   ------------------------------------------------------------------------------------------------------------------ Invalid input(s): POCBNP   CBG: No results for input(s): GLUCAP in the last 168 hours.     Studies: Dg Chest 2 View  12/15/2015  CLINICAL DATA:  Shortness of breath, congestive heart failure EXAM: CHEST  2 VIEW COMPARISON:  10/20/2015 FINDINGS: Borderline cardiomegaly. Large left retrocardiac diaphragmatic/ hiatal hernia again noted. Persistent mild left basilar atelectasis or scarring. There is dextroscoliosis lower thoracic spine. No infiltrate or pulmonary edema. Surgical clips are noted in left axilla. Atherosclerotic calcifications of thoracic aorta. Mild degenerative changes and osteopenia thoracic spine. IMPRESSION: Large left retrocardiac diaphragmatic/ hiatal hernia again noted. Persistent mild left basilar atelectasis or scarring. There is dextroscoliosis lower thoracic spine. No infiltrate or pulmonary edema. Electronically Signed   By: Lahoma Crocker M.D.   On: 12/15/2015 08:20   US Renal  12/14/2015  CLINICAL DATA:  Patient with congestive heart failure. EXAM: RENAL / URINARY TRACT ULTRASOUND COMPLETE COMPARISON:  MRI abdomen 12/13/2006 FINDINGS: Right Kidney: Length: 9.0 cm. Echogenicity within normal limits. No mass or hydronephrosis visualized. Left Kidney: Length: 9.9 cm. Echogenicity within normal limits. No mass or hydronephrosis visualized. Bladder: Decompressed and poorly evaluated. IMPRESSION: No hydronephrosis. Electronically Signed   By: Lovey Newcomer M.D.   On: 12/14/2015 18:33      Lab Results  Component Value Date   HGBA1C 5.8 02/06/2014   HGBA1C 5.6 09/08/2009   Lab Results  Component Value Date   MICROALBUR 0.6 03/23/2011   LDLCALC 60 11/04/2015   CREATININE 1.81* 12/16/2015       Scheduled Meds: . apixaban  2.5 mg Oral BID  . carvedilol  12.5 mg Oral BID WC  . feeding supplement (ENSURE ENLIVE)  237 mL Oral BID BM  .  furosemide  80 mg Intravenous Q12H  . mometasone-formoterol  2 puff Inhalation BID  . sodium chloride flush  3 mL Intravenous Q12H  . sodium chloride flush  3 mL Intravenous Q12H  . spironolactone  12.5 mg Oral Daily   Continuous Infusions:    LOS: 2 days    Time spent: >30 MINS    [facility]  [facility] Pager [phone]. If 7PM-7AM, please contact night-coverage at [email or web address], password [email or web address] 12/16/2015, 8:33 AM  LOS: 2 days

## 2015-12-17 ENCOUNTER — Other Ambulatory Visit: Payer: Self-pay | Admitting: *Deleted

## 2015-12-17 DIAGNOSIS — F419 Anxiety disorder, unspecified: Secondary | ICD-10-CM

## 2015-12-17 DIAGNOSIS — I5041 Acute combined systolic (congestive) and diastolic (congestive) heart failure: Secondary | ICD-10-CM

## 2015-12-17 LAB — BASIC METABOLIC PANEL
Anion gap: 11 (ref 5–15)
BUN: 49 mg/dL — ABNORMAL HIGH (ref 6–20)
CALCIUM: 8.9 mg/dL (ref 8.9–10.3)
CHLORIDE: 97 mmol/L — AB (ref 101–111)
CO2: 34 mmol/L — AB (ref 22–32)
CREATININE: 1.63 mg/dL — AB (ref 0.44–1.00)
GFR calc Af Amer: 32 mL/min — ABNORMAL LOW (ref >60)
GFR calc non Af Amer: 28 mL/min — ABNORMAL LOW (ref >60)
GLUCOSE: 104 mg/dL — AB (ref 65–99)
Potassium: 3.6 mmol/L (ref 3.5–5.1)
Sodium: 142 mmol/L (ref 135–145)

## 2015-12-17 LAB — ECHOCARDIOGRAM COMPLETE
Height: 63 in
Weight: 2422.4 oz

## 2015-12-17 MED ORDER — GUAIFENESIN-DM 100-10 MG/5ML PO SYRP
10.0000 mL | ORAL_SOLUTION | Freq: Four times a day (QID) | ORAL | Status: DC | PRN
  Administered 2015-12-17 – 2015-12-18 (×3): 10 mL via ORAL
  Filled 2015-12-17 (×3): qty 10

## 2015-12-17 MED ORDER — POTASSIUM CHLORIDE CRYS ER 20 MEQ PO TBCR
20.0000 meq | EXTENDED_RELEASE_TABLET | Freq: Once | ORAL | Status: AC
  Administered 2015-12-17: 20 meq via ORAL
  Filled 2015-12-17: qty 1

## 2015-12-17 MED ORDER — ALPRAZOLAM 0.5 MG PO TABS
0.5000 mg | ORAL_TABLET | Freq: Once | ORAL | Status: AC
  Administered 2015-12-17: 0.5 mg via ORAL
  Filled 2015-12-17: qty 1

## 2015-12-17 NOTE — Progress Notes (Signed)
Utilization review completed.  

## 2015-12-17 NOTE — Care Management Note (Signed)
Case Management Note Marvetta Gibbons RN, BSN Unit 2W-Case Manager [phone]  Patient Details  Name: Mia Contreras MRN: [id number] Date of Birth: April 29, 1929  Subjective/Objective:   Pt admitted with HF                 Action/Plan: PTA pt lived at home was going to wound clinic for leg wound, referral to [facility], pt interested in possible SNF rehab- CSW to see pt- vs home with [place]- CM to continue to follow for d/c needs  Expected Discharge Date:                  Expected Discharge Plan:  [place]  In-House Referral:  Clinical Social Work  Discharge planning Services  CM Consult  Post Acute Care Choice:    Choice offered to:     DME Arranged:    DME Agency:     HH Arranged:    [place] Agency:     Status of Service:  In process, will continue to follow  Medicare Important Message Given:  Yes Date Medicare IM Given:    Medicare IM give by:    Date Additional Medicare IM Given:    Additional Medicare Important Message give by:     If discussed at [place] of Stay Meetings, dates discussed:    Additional Comments:  Dawayne Patricia, RN 12/17/2015, 3:22 PM

## 2015-12-17 NOTE — Progress Notes (Signed)
Advanced Heart Failure Rounding Note    HPI:    Mia Contreras is a 82 y.o. female with a history of nonischemic cardiomyopathy, systolic CHF, CKD 3-4, L Breast cancer in 2000 admitted with CHF  Brisk diuresis with lasix and metolazone. Weight down 10 pounds overnight Feels better. Creatinine improved.  K 3.6  Objective:    Vital Signs:   Temp:  [97.6 F (36.4 C)-98.8 F (37.1 C)] 98.8 F (37.1 C) (04/28 0320) Pulse Rate:  [84-91] 89 (04/28 0758) Resp:  [18] 18 (04/28 0758) BP: (91-102)/(55-65) 101/55 mmHg (04/28 0320) SpO2:  [91 %-100 %] 94 % (04/28 0758) Weight:  [63.73 kg (140 lb 8 oz)] 63.73 kg (140 lb 8 oz) (04/28 0700) Last BM Date: 12/16/15  Weight change: Filed Weights   12/15/15 0644 12/16/15 0646 12/17/15 0700  Weight: 68.675 kg (151 lb 6.4 oz) 68.266 kg (150 lb 8 oz) 63.73 kg (140 lb 8 oz)    Intake/Output:   Intake/Output Summary (Last 24 hours) at 12/17/15 0806 Last data filed at 12/17/15 0329  Gross per 24 hour  Intake    500 ml  Output   5627 ml  Net  -5127 ml     Physical Exam: General: Elderly. Lying in bed. No respiratory difficulty HEENT: normal Neck: supple. JVP to jaw. Carotids 2+ bilat; no bruits. No thyromegaly or nodule noted. Cor: PMI nondisplaced. Irregular rate & rhythm. No rubs, gallops or murmurs. Lungs: clear dull at left base Abdomen: soft, NT, ND, no HSM. No bruits or masses. +BS  Extremities: no cyanosis, clubbing, rash, 1+ edema into thighs. Left leg wrapped with gauze due to wound Neuro: alert & oriented x 3, cranial nerves grossly intact. moves all 4 extremities w/o difficulty. Affect pleasant.  Telemetry: Afib 80-90s (reviewed personally)  Labs: Basic Metabolic Panel:  Recent Labs Lab 12/14/15 1337 12/15/15 0315 12/16/15 0246 12/17/15 0416  NA 141 141 141 142  K 4.9 4.1 4.0 3.6  CL 99* 101 101 97*  CO2 29 29 28  34*  GLUCOSE 115* 109* 106* 104*  BUN 80* 73* 64* 49*  CREATININE 2.24* 1.99* 1.81* 1.63*    CALCIUM 9.9 9.0 8.7* 8.9  MG 2.3  --   --   --   PHOS 4.3  --   --   --     Liver Function Tests:  Recent Labs Lab 12/14/15 1337 12/16/15 0246  AST 28 26  ALT 28 24  ALKPHOS 60 48  BILITOT 1.0 0.5  PROT 6.4* 5.2*  ALBUMIN 3.6 3.0*   No results for input(s): LIPASE, AMYLASE in the last 168 hours. No results for input(s): AMMONIA in the last 168 hours.  CBC:  Recent Labs Lab 12/14/15 1337 12/15/15 0315 12/16/15 0246  WBC 8.2 6.4 6.6  HGB 11.3* 9.8* 10.0*  HCT 36.5 31.9* 32.5*  MCV 87.5 87.4 88.1  PLT 277 215 213    Cardiac Enzymes:  Recent Labs Lab 12/14/15 1337  TROPONINI 0.04*    BNP: BNP (last 3 results)  Recent Labs  12/14/15 1337  BNP 1357.2*    ProBNP (last 3 results)  Recent Labs  12/01/15 1052  PROBNP 1090.0*     CBG: No results for input(s): GLUCAP in the last 168 hours.  Coagulation Studies: No results for input(s): LABPROT, INR in the last 72 hours.  Other results: EKG: 12/09/15 NSR with 1st degree block, 63 bpm -> now AF  Imaging: No results found.   Medications:  Current Medications: . apixaban  2.5 mg Oral BID  . carvedilol  12.5 mg Oral BID WC  . feeding supplement (ENSURE ENLIVE)  237 mL Oral BID BM  . furosemide  80 mg Intravenous BID  . metolazone  2.5 mg Oral Daily  . mometasone-formoterol  2 puff Inhalation BID  . potassium chloride  20 mEq Oral BID  . sodium chloride flush  3 mL Intravenous Q12H  . sodium chloride flush  3 mL Intravenous Q12H  . spironolactone  12.5 mg Oral Daily    Infusions:     Assessment   1. Acute on chronic systolic HF with R>>L symptoms 2. Acute on chronic renal failure, stage 4 3. LE wounds 4. H/o COPD 5. H/o breast cancer s/p chemo 2000 6. A fib, new onset    -CHADSVASC 5  Plan    Excellent diuresis with IV lasix and metolazone. Renal function improved. Will continue IV lasix one more day. Given one more dose metolazone. Supp K+/ Renal u/s and UA reviewed  personally and these are normal. Having trouble getting out of bed to go to bathroom. Will try to pull Foley tomorrow.   She has new onset AF with good rate control. Eliquis started 4/26. So far appears to be tolerating AF. ECG suggestive of ischemic CM but not sure if she is good cath candidate at this point - given age, comorbidities and lack of angina I favor medica therapy. PT following recommend HHPT  LEs wrapped for wounds  Bensimhon, Daniel,MD 8:06 AM Advanced Heart Failure Team Pager [phone] (M-F; 7a - 4p)  Please contact [facility] for night-coverage after hours (4p -7a ) and weekends on [email or web address]

## 2015-12-17 NOTE — Progress Notes (Signed)
Occupational Therapy Treatment Patient Details Name: Mia Contreras MRN: [id number] DOB: 1929-05-13 Today's Date: 12/17/2015    History of present illness pt presents with Acute on Chronic CHF.  pt with hx of CHF, Thyroid Dysfunction, Anemia, HTN, Venous Insufficiency, Scoliosis, Breast CA, Skin CA, Bil TKR, Partial Mastectomy, and Carpal Tunnel Release.     OT comments  Patient progressing well towards OT goals. Still with decreased activity tolerance. OT will continue to follow.  Follow Up Recommendations  Home health OT    Equipment Recommendations  None recommended by OT    Recommendations for Other Services      Precautions / Restrictions Precautions Precautions: Fall Restrictions Weight Bearing Restrictions: No       Mobility Bed Mobility               General bed mobility comments: sitting EOB upon arrival  Transfers Overall transfer level: Needs assistance Equipment used: Rolling walker (2 wheeled) Transfers: Sit to/from Stand Sit to Stand: Supervision              Balance                                   ADL Overall ADL's : Needs assistance/impaired Eating/Feeding: Independent Eating/Feeding Details (indicate cue type and reason): sitting EOB Grooming: Wash/dry hands;Oral care;Supervision/safety;Standing               Lower Body Dressing: Minimal assistance;Sit to/from stand Lower Body Dressing Details (indicate cue type and reason): RLE wrap/stocking Toilet Transfer: Supervision/safety;Ambulation;Comfort height toilet;RW   Toileting- Clothing Manipulation and Hygiene: Supervision/safety;Sit to/from stand       Functional mobility during ADLs: Supervision/safety;Rolling walker        Vision                     Perception     Praxis      Cognition   Behavior During Therapy: WFL for tasks assessed/performed Overall Cognitive Status: Within Functional Limits for tasks assessed                        Extremity/Trunk Assessment               Exercises     Shoulder Instructions       General Comments      Pertinent Vitals/ Pain       Pain Assessment: No/denies pain  Home Living                                          Prior Functioning/Environment              Frequency Min 2X/week     Progress Toward Goals  OT Goals(current goals can now be found in the care plan section)  Progress towards OT goals: Progressing toward goals     Plan Discharge plan remains appropriate    Co-evaluation                 End of Session Equipment Utilized During Treatment: Rolling walker;Gait belt   Activity Tolerance Patient tolerated treatment well   Patient Left in chair;with call bell/phone within reach   Nurse Communication Mobility status        Time: [id number] OT Time Calculation (min): 19 min  Charges: OT  General Charges $OT Visit: 1 Procedure OT Treatments $Self Care/Home Management : 8-22 mins  Mia Contreras A 12/17/2015, 10:11 AM

## 2015-12-17 NOTE — Care Management Important Message (Signed)
Important Message  Patient Details  Name: KAYLEI WOMELSDORF MRN: [id number] Date of Birth: 19-Dec-1928   Medicare Important Message Given:  Yes    Dawayne Patricia, RN 12/17/2015, 1:18 PM

## 2015-12-17 NOTE — Consult Note (Addendum)
   [facility] Inpatient Consult   12/17/2015  Mia Contreras [birth year] [id number]   Patient evaluated for long-term disease management services with [place] Management program. Martin Majestic to bedside to discuss and offer [place] Management services. Patient is agreeable and written consent signed. Explained to patient that he/she will receive post hospital transition of care calls and will be evaluated for monthly home visits. Confirmed Primary Care MD as Dr. Alain Marion.  Confirmed best contact number as home [phone] or cell [phone]. Explained that [facility] will not interfere or replace services provided by home health.  Left [facility] Care Management packet, contact information, and [facility] Care Management calendar/notebook to keep track of her daily weights, blood pressure and the like. Mia Contreras expresses appreciation of additional follow up. States that her medications are being changed around and she would like for someone to go over them with her. She endorses she lives alone. Denies having issues with affording medications. She states her friends sometimes assist with taking her to her MD appointments. Mia Contreras also states she goes to the wound clinic every Monday for her leg. Will make inpatient RNCM aware that patient will be followed by [place] Management post hospital discharge.  Will request for patient to be assigned to [place] for CHF disease and symptom management.    Marthenia Rolling, MSN-Ed, RN,BSN [facility] Liaison [phone]

## 2015-12-17 NOTE — Progress Notes (Signed)
[facility] PROGRESS NOTE  Mia Contreras [id number] DOB: [birth year] DOA: 12/14/2015   PCP: Walker Kehr, MD     Assessment/Plan: Active Problems:   hx: breast cancer, left, infiltrating ductal   Leg wound, left   Acute on chronic combined systolic and diastolic heart failure (HCC)   Acute kidney injury (HCC)   CKD (chronic kidney disease) stage 2, GFR 60-89 ml/min   Anxiety   IBS (irritable bowel syndrome)   Acute CHF ([place])   82 y.o. female with medical history significant of thyroid dysfunction, HLD, anemia, HTN, venous insufficiency, chronic left leg wound, scoliosis, breast cancer, PAC, IBS presenting to heart failure clinic for initial intake examination. During visit it became a apparent that over the past 4 weeks patient has developed significant decompensation from a cardiac standpoint as well as some worsening renal function. Of note patient states she's had about 4 week history of progressive lower extremity edema extending from her ankles to her thighs. Patient states that this all started after she was treated for bronchitis with a Z-Pak and steroids. Patient has continued to take her torsemide 10 mg twice a day without any improvement in her urine output. Patient states make urine.  Assessment and plan Acute on chronic combined systolic and diastolic congestive heart failure. Echo from 2016 shows EF of 30% and grade 1 diastolic dysfunction. Patient with four-week history of weight gain.  Dry weight 138 pounds. Since admission weight has changed from 153 pounds to 140 pounds  Seen by Dr. Haroldine Laws in HF clinic for intake exam and admitted directly from clinic. HF team to follow - Tele  Shows atrial fibrillation - continue IV lasix 40 BID , cardiology added  Zaroxolyn-5625 mL of urine output - Repeat Echo  pending - strict I/O, Dly wts - CHF order set utilized.  Creatinine slightly better with diuresis and holding of Entresto. May be able to investigate  possibility of underlying ICM via heart cath if continues to improve - Continue coreg Continue IV Lasix for 1 more days   atrial fibrillation- new onset, started on Eliquis  4/26 , CHADSVASC 5  AoCKD: Cr 2.47>1.99> 1.81>1.63  improving with diuresis. Baseline 1.1. Suspect from cardiorenal syndrome and minimally from chronic diuretic use .  - Renal US negative - BMET in am    LLE wound: skin tear w/ [place] injury. Present for 7 weeks. Excellent outpt f/u at wound clinic. Last bandage change 1 day ago.  Patient being followed by wound care - Treatment of CHF as above will help w/ leg wound healing - prealbumin and nutrition consult - Pt/Ot (Numerous falls and LE wounds in past from running into household items. May need higher level of care at home/facility) PT recommended home health  Anxiety: - continue xanax  Asthma/COPD: - continue advair  IBS: - continue Lomotil  L Breast Ca: s/p chemo 2008. No recurrence    DVT prophylaxsis heparin  Code Status:  Full code    Family Communication: Discussed in detail with the patient, all imaging results, lab results explained to the patient   Disposition Plan:  Anticipate discharge as per cardiology recommendations, home with home health, wants SNF    Consultants:  None  Procedures:  None  Antibiotics: Anti-infectives    None         HPI/Subjective: Denies any chest pain or shortness of breath,   Objective: Filed Vitals:   12/16/15 2029 12/17/15 0320 12/17/15 0700 12/17/15 0758  BP: 102/65  101/55    Pulse: 84 91  89  Temp: 97.6 F (36.4 C) 98.8 F (37.1 C)    TempSrc: Oral Oral    Resp: 18 18  18   Height:      Weight:   63.73 kg (140 lb 8 oz)   SpO2: 91% 95%  94%    Intake/Output Summary (Last 24 hours) at 12/17/15 0912 Last data filed at 12/17/15 0730  Gross per 24 hour  Intake    740 ml  Output   6477 ml  Net  -5737 ml    Exam:  Examination:  General exam: Appears calm and comfortable   Respiratory system: Clear to auscultation. Respiratory effort normal. Cardiovascular system: S1 & S2 heard, RRR. No JVD, murmurs, rubs, gallops or clicks. No pedal edema. Gastrointestinal system: Abdomen is nondistended, soft and nontender. No organomegaly or masses felt. Normal bowel sounds heard. Central nervous system: Alert and oriented. No focal neurological deficits. Extremities: no cyanosis, clubbing, rash, 3-4+ edema into thighs. Left leg wrapped with gauze due to wound Skin: No rashes, lesions or ulcers Psychiatry: Judgement and insight appear normal. Mood & affect appropriate.     Data Reviewed: I have personally reviewed following labs and imaging studies  Micro Results No results found for this or any previous visit (from the past 240 hour(s)).  Radiology Reports Dg Chest 2 View  12/15/2015  CLINICAL DATA:  Shortness of breath, congestive heart failure EXAM: CHEST  2 VIEW COMPARISON:  10/20/2015 FINDINGS: Borderline cardiomegaly. Large left retrocardiac diaphragmatic/ hiatal hernia again noted. Persistent mild left basilar atelectasis or scarring. There is dextroscoliosis lower thoracic spine. No infiltrate or pulmonary edema. Surgical clips are noted in left axilla. Atherosclerotic calcifications of thoracic aorta. Mild degenerative changes and osteopenia thoracic spine. IMPRESSION: Large left retrocardiac diaphragmatic/ hiatal hernia again noted. Persistent mild left basilar atelectasis or scarring. There is dextroscoliosis lower thoracic spine. No infiltrate or pulmonary edema. Electronically Signed   By: Lahoma Crocker M.D.   On: 12/15/2015 08:20   US Renal  12/14/2015  CLINICAL DATA:  Patient with congestive heart failure. EXAM: RENAL / URINARY TRACT ULTRASOUND COMPLETE COMPARISON:  MRI abdomen 12/13/2006 FINDINGS: Right Kidney: Length: 9.0 cm. Echogenicity within normal limits. No mass or hydronephrosis visualized. Left Kidney: Length: 9.9 cm. Echogenicity within normal limits. No  mass or hydronephrosis visualized. Bladder: Decompressed and poorly evaluated. IMPRESSION: No hydronephrosis. Electronically Signed   By: Lovey Newcomer M.D.   On: 12/14/2015 18:33     CBC  Recent Labs Lab 12/14/15 1337 12/15/15 0315 12/16/15 0246  WBC 8.2 6.4 6.6  HGB 11.3* 9.8* 10.0*  HCT 36.5 31.9* 32.5*  PLT 277 215 213  MCV 87.5 87.4 88.1  MCH 27.1 26.8 27.1  MCHC 31.0 30.7 30.8  RDW 15.2 15.3 15.4    Chemistries   Recent Labs Lab 12/14/15 1337 12/15/15 0315 12/16/15 0246 12/17/15 0416  NA 141 141 141 142  K 4.9 4.1 4.0 3.6  CL 99* 101 101 97*  CO2 29 29 28  34*  GLUCOSE 115* 109* 106* 104*  BUN 80* 73* 64* 49*  CREATININE 2.24* 1.99* 1.81* 1.63*  CALCIUM 9.9 9.0 8.7* 8.9  MG 2.3  --   --   --   AST 28  --  26  --   ALT 28  --  24  --   ALKPHOS 60  --  48  --   BILITOT 1.0  --  0.5  --    ------------------------------------------------------------------------------------------------------------------ estimated creatinine  clearance is 23.1 mL/min (by C-G formula based on Cr of 1.63). ------------------------------------------------------------------------------------------------------------------ No results for input(s): HGBA1C in the last 72 hours. ------------------------------------------------------------------------------------------------------------------ No results for input(s): CHOL, HDL, LDLCALC, TRIG, CHOLHDL, LDLDIRECT in the last 72 hours. ------------------------------------------------------------------------------------------------------------------  Recent Labs  12/14/15 1337  TSH 2.773   ------------------------------------------------------------------------------------------------------------------ No results for input(s): VITAMINB12, FOLATE, FERRITIN, TIBC, IRON, RETICCTPCT in the last 72 hours.  Coagulation profile No results for input(s): INR, PROTIME in the last 168 hours.  No results for input(s): DDIMER in the last 72  hours.  Cardiac Enzymes  Recent Labs Lab 12/14/15 1337  TROPONINI 0.04*   ------------------------------------------------------------------------------------------------------------------ Invalid input(s): POCBNP   CBG: No results for input(s): GLUCAP in the last 168 hours.     Studies: No results found.    Lab Results  Component Value Date   HGBA1C 5.8 02/06/2014   HGBA1C 5.6 09/08/2009   Lab Results  Component Value Date   MICROALBUR 0.6 03/23/2011   LDLCALC 60 11/04/2015   CREATININE 1.63* 12/17/2015       Scheduled Meds: . apixaban  2.5 mg Oral BID  . carvedilol  12.5 mg Oral BID WC  . feeding supplement (ENSURE ENLIVE)  237 mL Oral BID BM  . furosemide  80 mg Intravenous BID  . metolazone  2.5 mg Oral Daily  . mometasone-formoterol  2 puff Inhalation BID  . potassium chloride  20 mEq Oral BID  . sodium chloride flush  3 mL Intravenous Q12H  . sodium chloride flush  3 mL Intravenous Q12H  . spironolactone  12.5 mg Oral Daily   Continuous Infusions:    LOS: 3 days    Time spent: >30 MINS    [facility]  [facility] Pager [phone]. If 7PM-7AM, please contact night-coverage at [email or web address], password [facility] 12/17/2015, 9:12 AM  LOS: 3 days

## 2015-12-18 ENCOUNTER — Encounter (HOSPITAL_COMMUNITY): Payer: Self-pay

## 2015-12-18 DIAGNOSIS — I272 Other secondary pulmonary hypertension: Secondary | ICD-10-CM

## 2015-12-18 LAB — CBC
HCT: 34 % — ABNORMAL LOW (ref 36.0–46.0)
Hemoglobin: 10.8 g/dL — ABNORMAL LOW (ref 12.0–15.0)
MCH: 28.3 pg (ref 26.0–34.0)
MCHC: 31.8 g/dL (ref 30.0–36.0)
MCV: 89 fL (ref 78.0–100.0)
PLATELETS: 202 10*3/uL (ref 150–400)
RBC: 3.82 MIL/uL — ABNORMAL LOW (ref 3.87–5.11)
RDW: 15.7 % — AB (ref 11.5–15.5)
WBC: 8.1 10*3/uL (ref 4.0–10.5)

## 2015-12-18 LAB — COMPREHENSIVE METABOLIC PANEL
ALBUMIN: 3.1 g/dL — AB (ref 3.5–5.0)
ALK PHOS: 52 U/L (ref 38–126)
ALT: 39 U/L (ref 14–54)
AST: 39 U/L (ref 15–41)
Anion gap: 14 (ref 5–15)
BILIRUBIN TOTAL: 1.3 mg/dL — AB (ref 0.3–1.2)
BUN: 40 mg/dL — AB (ref 6–20)
CO2: 33 mmol/L — ABNORMAL HIGH (ref 22–32)
CREATININE: 1.6 mg/dL — AB (ref 0.44–1.00)
Calcium: 9.4 mg/dL (ref 8.9–10.3)
Chloride: 94 mmol/L — ABNORMAL LOW (ref 101–111)
GFR calc Af Amer: 33 mL/min — ABNORMAL LOW (ref >60)
GFR calc non Af Amer: 28 mL/min — ABNORMAL LOW (ref >60)
Glucose, Bld: 98 mg/dL (ref 65–99)
POTASSIUM: 3.7 mmol/L (ref 3.5–5.1)
SODIUM: 141 mmol/L (ref 135–145)
TOTAL PROTEIN: 5.6 g/dL — AB (ref 6.5–8.1)

## 2015-12-18 MED ORDER — FUROSEMIDE 10 MG/ML IJ SOLN
40.0000 mg | Freq: Two times a day (BID) | INTRAMUSCULAR | Status: DC
  Administered 2015-12-18 (×2): 40 mg via INTRAVENOUS
  Filled 2015-12-18 (×2): qty 4

## 2015-12-18 MED ORDER — AMIODARONE HCL 200 MG PO TABS
200.0000 mg | ORAL_TABLET | Freq: Two times a day (BID) | ORAL | Status: DC
  Administered 2015-12-18 – 2015-12-19 (×3): 200 mg via ORAL
  Filled 2015-12-18 (×3): qty 1

## 2015-12-18 NOTE — Progress Notes (Signed)
Advanced Heart Failure Rounding Note    HPI:    Mia Contreras is a 82 y.o. female with a history of nonischemic cardiomyopathy, systolic CHF, CKD 3-4, L Breast cancer in 2000 admitted with CHF  Brisk diuresis again with IV with lasix and metolazone. Feels better though didn't sleep well last night. Labs pending  Objective:    Vital Signs:   Temp:  [97.7 F (36.5 C)-97.8 F (36.6 C)] 97.8 F (36.6 C) (04/28 2140) Pulse Rate:  [85-100] 85 (04/28 2140) Resp:  [18] 18 (04/28 2140) BP: (79-100)/(50-57) 100/57 mmHg (04/28 2140) SpO2:  [94 %-95 %] 95 % (04/28 2140) Weight:  [63.73 kg (140 lb 8 oz)] 63.73 kg (140 lb 8 oz) (04/28 0700) Last BM Date: 12/17/15  Weight change: Filed Weights   12/15/15 0644 12/16/15 0646 12/17/15 0700  Weight: 68.675 kg (151 lb 6.4 oz) 68.266 kg (150 lb 8 oz) 63.73 kg (140 lb 8 oz)    Intake/Output:   Intake/Output Summary (Last 24 hours) at 12/18/15 0357 Last data filed at 12/17/15 2100  Gross per 24 hour  Intake    600 ml  Output   3350 ml  Net  -2750 ml     Physical Exam: General: Elderly. Lying in bed. No respiratory difficulty HEENT: normal Neck: supple. JVP 8. Carotids 2+ bilat; no bruits. No thyromegaly or nodule noted. Cor: PMI nondisplaced. Irregular rate & rhythm. No rubs, gallops or murmurs. Lungs: clear dull at left base Abdomen: soft, NT, ND, no HSM. No bruits or masses. +BS  Extremities: no cyanosis, clubbing, rash, 1+ edema into thighs. Left leg wrapped with gauze due to wound Neuro: alert & oriented x 3, cranial nerves grossly intact. moves all 4 extremities w/o difficulty. Affect pleasant.  Telemetry: Afib 90-100 (reviewed personally)  Labs: Basic Metabolic Panel:  Recent Labs Lab 12/14/15 1337 12/15/15 0315 12/16/15 0246 12/17/15 0416  NA 141 141 141 142  K 4.9 4.1 4.0 3.6  CL 99* 101 101 97*  CO2 29 29 28  34*  GLUCOSE 115* 109* 106* 104*  BUN 80* 73* 64* 49*  CREATININE 2.24* 1.99* 1.81* 1.63*    CALCIUM 9.9 9.0 8.7* 8.9  MG 2.3  --   --   --   PHOS 4.3  --   --   --     Liver Function Tests:  Recent Labs Lab 12/14/15 1337 12/16/15 0246  AST 28 26  ALT 28 24  ALKPHOS 60 48  BILITOT 1.0 0.5  PROT 6.4* 5.2*  ALBUMIN 3.6 3.0*   No results for input(s): LIPASE, AMYLASE in the last 168 hours. No results for input(s): AMMONIA in the last 168 hours.  CBC:  Recent Labs Lab 12/14/15 1337 12/15/15 0315 12/16/15 0246  WBC 8.2 6.4 6.6  HGB 11.3* 9.8* 10.0*  HCT 36.5 31.9* 32.5*  MCV 87.5 87.4 88.1  PLT 277 215 213    Cardiac Enzymes:  Recent Labs Lab 12/14/15 1337  TROPONINI 0.04*    BNP: BNP (last 3 results)  Recent Labs  12/14/15 1337  BNP 1357.2*    ProBNP (last 3 results)  Recent Labs  12/01/15 1052  PROBNP 1090.0*     CBG: No results for input(s): GLUCAP in the last 168 hours.  Coagulation Studies: No results for input(s): LABPROT, INR in the last 72 hours.  Other results: EKG: 12/09/15 NSR with 1st degree block, 63 bpm -> now AF  Imaging: No results found.   Medications:  Current Medications: . apixaban  2.5 mg Oral BID  . carvedilol  12.5 mg Oral BID WC  . feeding supplement (ENSURE ENLIVE)  237 mL Oral BID BM  . furosemide  80 mg Intravenous BID  . metolazone  2.5 mg Oral Daily  . mometasone-formoterol  2 puff Inhalation BID  . potassium chloride  20 mEq Oral BID  . sodium chloride flush  3 mL Intravenous Q12H  . sodium chloride flush  3 mL Intravenous Q12H  . spironolactone  12.5 mg Oral Daily    Infusions:     Assessment   1. Acute on chronic diastolic/systolic HF with R>>L symptoms. EF 45-50% 2. Acute on chronic renal failure, stage 4 3. LE wounds 4. H/o COPD 5. H/o breast cancer s/p chemo 2000 6. A fib, new onset    -CHADSVASC 5 7. [place]  Plan    Excellent diuresis with IV lasix and metolazone. Renal function improved yesterday. Will give IV lasix today (no metolazone) and then switch back to po  torsemide. Await labs for today. (stop lasix if renal function worse)  Echo reviewed personally today and she has EF 45-50% with severe PH with PAPs ~90 (prior to diuresis). There is massive biatrial enlargement suggestive of restrictive CM. ECG suggestive of ischemic CM but not sure if she is good cath candidate at this point - given age, comorbidities and lack of angina I favor medica therapy. PT following recommend HHPT  She has new onset AF. Rate now increasing. Eliquis started 4/26. So far appears to be tolerating AF. Will start po amio for rate control as BP too low for   LEs wrapped for wounds  [name],MD 3:57 AM Advanced Heart Failure Team Pager [phone] (M-F; 7a - 4p)  Please contact [facility] for night-coverage after hours (4p -7a ) and weekends on [email or web address]

## 2015-12-18 NOTE — Progress Notes (Signed)
[facility] PROGRESS NOTE  Mia Contreras [id number] DOB: 12-Mar-1929 DOA: 12/14/2015   PCP: Walker Kehr, MD  Narrative: 82 y.o. female with medical history significant of thyroid dysfunction, HLD, anemia, HTN, venous insufficiency, chronic left leg wound, scoliosis, breast cancer, PAC, IBS presenting to heart failure clinic for initial intake examination. During visit it became a apparent that over the past 4 weeks patient has developed significant decompensation from a cardiac standpoint as well as some worsening renal function. Of note patient states she's had about 4 week history of progressive lower extremity edema extending from her ankles to her thighs. Patient states that this all started after she was treated for bronchitis with a Z-Pak and steroids. Patient has continued to take her torsemide 10 mg twice a day without any improvement in her urine output.  Started on IV lasix, diuresing well, improving, found to have Afib, started Eliquis  Assessment and plan:  Acute on chronic combined systolic and diastolic congestive heart failure. Echo from 2016 shows EF of 30% and grade 1 diastolic dysfunction. Patient with four-week history of weight gain.  Dry weight 138 pounds, weight down to 138lbs now -per CHF team , iV lasix this am and then possibly starting PO, also got metolazone, also on aldactone -9.8L negative -Repeat Echo EF 45-50% diffuse hypokinesis -monitor I/O, Dly wts - CHF order set utilized.  -creatinine stable  P. atrial fibrillation-  -starting Po amiodarone per Cards -new onset, started on Eliquis  4/26 , CHADSVASC 5 -on Coreg  AoCKD: Cr 2.47>1.99> 1.81>1.63  improving with diuresis. Baseline 1.1. Suspect from cardiorenal syndrome and minimally from chronic diuretic use .  - Renal US negative -improving  LLE wound: skin tear w/ [place] injury. Present for 7 weeks. Excellent outpt f/u at wound clinic. Last bandage change 1 day ago.  Patient being  followed by wound care - Treatment of CHF as above will help w/ leg wound healing - prealbumin and nutrition consult - Pt/Ot (Numerous falls and LE wounds in past from running into household items. May need higher level of care at home/facility) PT recommended home health -Wound care FU on Monday at [place]: - continue xanax  Asthma/COPD: - continue advair  IBS: - continue Lomotil  L Breast Ca: s/p chemo 2008. No recurrence  DVT prophylaxsis : on apixaban  Code Status:  Full code  Family Communication: None at bedside Disposition Plan:  Home tomorrow once cleared by cards   Consultants:  None  Procedures:  None  Antibiotics: Anti-infectives    None         HPI/Subjective: Feels ok  Objective: Filed Vitals:   12/17/15 1342 12/17/15 2140 12/18/15 0500 12/18/15 0747  BP: 96/57 100/57 106/55   Pulse:  85 92 90  Temp:  97.8 F (36.6 C) 97.9 F (36.6 C)   TempSrc:  Oral Oral   Resp:  18 18 18   Height:      Weight:   62.823 kg (138 lb 8 oz)   SpO2:  95% 92% 92%    Intake/Output Summary (Last 24 hours) at 12/18/15 1413 Last data filed at 12/18/15 1258  Gross per 24 hour  Intake    360 ml  Output   5975 ml  Net  -5615 ml    Exam:  Examination:  General exam: Appears calm and comfortable, AAOx3, no distress Respiratory system: Clear to auscultation. Respiratory effort normal. Cardiovascular system: S1 & S2 heard, RRR. No JVD, murmurs,  rubs, gallops or clicks. Gastrointestinal system: Abdomen is nondistended, soft and nontender. No organomegaly or masses felt. Normal bowel sounds heard. Central nervous system: Alert and oriented. No focal neurological deficits. Extremities: no cyanosis, clubbing, rash, 1plus edema . Left leg wrapped with gauze due to wound Skin: No rashes, lesions or ulcers Psychiatry: Judgement and insight appear normal. Mood & affect appropriate.     Data Reviewed: I have personally reviewed following labs and  imaging studies  Micro Results No results found for this or any previous visit (from the past 240 hour(s)).  Radiology Reports Dg Chest 2 View  12/15/2015  CLINICAL DATA:  Shortness of breath, congestive heart failure EXAM: CHEST  2 VIEW COMPARISON:  10/20/2015 FINDINGS: Borderline cardiomegaly. Large left retrocardiac diaphragmatic/ hiatal hernia again noted. Persistent mild left basilar atelectasis or scarring. There is dextroscoliosis lower thoracic spine. No infiltrate or pulmonary edema. Surgical clips are noted in left axilla. Atherosclerotic calcifications of thoracic aorta. Mild degenerative changes and osteopenia thoracic spine. IMPRESSION: Large left retrocardiac diaphragmatic/ hiatal hernia again noted. Persistent mild left basilar atelectasis or scarring. There is dextroscoliosis lower thoracic spine. No infiltrate or pulmonary edema. Electronically Signed   By: Lahoma Crocker M.D.   On: 12/15/2015 08:20   US Renal  12/14/2015  CLINICAL DATA:  Patient with congestive heart failure. EXAM: RENAL / URINARY TRACT ULTRASOUND COMPLETE COMPARISON:  MRI abdomen 12/13/2006 FINDINGS: Right Kidney: Length: 9.0 cm. Echogenicity within normal limits. No mass or hydronephrosis visualized. Left Kidney: Length: 9.9 cm. Echogenicity within normal limits. No mass or hydronephrosis visualized. Bladder: Decompressed and poorly evaluated. IMPRESSION: No hydronephrosis. Electronically Signed   By: Lovey Newcomer M.D.   On: 12/14/2015 18:33     CBC  Recent Labs Lab 12/14/15 1337 12/15/15 0315 12/16/15 0246 12/18/15 0509  WBC 8.2 6.4 6.6 8.1  HGB 11.3* 9.8* 10.0* 10.8*  HCT 36.5 31.9* 32.5* 34.0*  PLT 277 215 213 202  MCV 87.5 87.4 88.1 89.0  MCH 27.1 26.8 27.1 28.3  MCHC 31.0 30.7 30.8 31.8  RDW 15.2 15.3 15.4 15.7*    Chemistries   Recent Labs Lab 12/14/15 1337 12/15/15 0315 12/16/15 0246 12/17/15 0416 12/18/15 0509  NA 141 141 141 142 141  K 4.9 4.1 4.0 3.6 3.7  CL 99* 101 101 97* 94*  CO2  29 29 28  34* 33*  GLUCOSE 115* 109* 106* 104* 98  BUN 80* 73* 64* 49* 40*  CREATININE 2.24* 1.99* 1.81* 1.63* 1.60*  CALCIUM 9.9 9.0 8.7* 8.9 9.4  MG 2.3  --   --   --   --   AST 28  --  26  --  39  ALT 28  --  24  --  39  ALKPHOS 60  --  48  --  52  BILITOT 1.0  --  0.5  --  1.3*   ------------------------------------------------------------------------------------------------------------------ estimated creatinine clearance is 21.7 mL/min (by C-G formula based on Cr of 1.6). ------------------------------------------------------------------------------------------------------------------ No results for input(s): HGBA1C in the last 72 hours. ------------------------------------------------------------------------------------------------------------------ No results for input(s): CHOL, HDL, LDLCALC, TRIG, CHOLHDL, LDLDIRECT in the last 72 hours. ------------------------------------------------------------------------------------------------------------------ No results for input(s): TSH, T4TOTAL, T3FREE, THYROIDAB in the last 72 hours.  Invalid input(s): FREET3 ------------------------------------------------------------------------------------------------------------------ No results for input(s): VITAMINB12, FOLATE, FERRITIN, TIBC, IRON, RETICCTPCT in the last 72 hours.  Coagulation profile No results for input(s): INR, PROTIME in the last 168 hours.  No results for input(s): DDIMER in the last 72 hours.  Cardiac Enzymes  Recent Labs Lab  12/14/15 1337  TROPONINI 0.04*   ------------------------------------------------------------------------------------------------------------------ Invalid input(s): POCBNP   CBG: No results for input(s): GLUCAP in the last 168 hours.     Studies: No results found.    Lab Results  Component Value Date   HGBA1C 5.8 02/06/2014   HGBA1C 5.6 09/08/2009   Lab Results  Component Value Date   MICROALBUR 0.6 03/23/2011   LDLCALC 60  11/04/2015   CREATININE 1.60* 12/18/2015       Scheduled Meds: . amiodarone  200 mg Oral BID  . apixaban  2.5 mg Oral BID  . carvedilol  12.5 mg Oral BID WC  . feeding supplement (ENSURE ENLIVE)  237 mL Oral BID BM  . furosemide  40 mg Intravenous BID  . mometasone-formoterol  2 puff Inhalation BID  . potassium chloride  20 mEq Oral BID  . sodium chloride flush  3 mL Intravenous Q12H  . sodium chloride flush  3 mL Intravenous Q12H  . spironolactone  12.5 mg Oral Daily   Continuous Infusions:    LOS: 4 days    Time spent: >30 MINS    [facility] Pager [phone] If 7PM-7AM, please contact night-coverage at [email or web address], password [facility] 12/18/2015, 2:13 PM  LOS: 4 days

## 2015-12-19 DIAGNOSIS — I480 Paroxysmal atrial fibrillation: Secondary | ICD-10-CM | POA: Insufficient documentation

## 2015-12-19 LAB — BASIC METABOLIC PANEL
Anion gap: 13 (ref 5–15)
BUN: 40 mg/dL — ABNORMAL HIGH (ref 6–20)
CHLORIDE: 94 mmol/L — AB (ref 101–111)
CO2: 33 mmol/L — AB (ref 22–32)
CREATININE: 1.67 mg/dL — AB (ref 0.44–1.00)
Calcium: 9.6 mg/dL (ref 8.9–10.3)
GFR calc non Af Amer: 27 mL/min — ABNORMAL LOW (ref >60)
GFR, EST AFRICAN AMERICAN: 31 mL/min — AB (ref >60)
Glucose, Bld: 94 mg/dL (ref 65–99)
POTASSIUM: 4 mmol/L (ref 3.5–5.1)
Sodium: 140 mmol/L (ref 135–145)

## 2015-12-19 LAB — CBC WITH DIFFERENTIAL/PLATELET
Basophils Absolute: 0 10*3/uL (ref 0.0–0.1)
Basophils Relative: 0 %
EOS ABS: 0.1 10*3/uL (ref 0.0–0.7)
Eosinophils Relative: 1 %
HCT: 33.9 % — ABNORMAL LOW (ref 36.0–46.0)
HEMOGLOBIN: 10.6 g/dL — AB (ref 12.0–15.0)
LYMPHS ABS: 1.5 10*3/uL (ref 0.7–4.0)
LYMPHS PCT: 20 %
MCH: 27.7 pg (ref 26.0–34.0)
MCHC: 31.3 g/dL (ref 30.0–36.0)
MCV: 88.7 fL (ref 78.0–100.0)
MONOS PCT: 10 %
Monocytes Absolute: 0.8 10*3/uL (ref 0.1–1.0)
NEUTROS PCT: 69 %
Neutro Abs: 5.1 10*3/uL (ref 1.7–7.7)
Platelets: 196 10*3/uL (ref 150–400)
RBC: 3.82 MIL/uL — ABNORMAL LOW (ref 3.87–5.11)
RDW: 15.7 % — ABNORMAL HIGH (ref 11.5–15.5)
WBC: 7.4 10*3/uL (ref 4.0–10.5)

## 2015-12-19 MED ORDER — TORSEMIDE 20 MG PO TABS: 20.0000 mg | ORAL_TABLET | Freq: Every day | ORAL | Status: DC

## 2015-12-19 MED ORDER — TORSEMIDE 20 MG PO TABS
20.0000 mg | ORAL_TABLET | Freq: Every day | ORAL | Status: DC
  Administered 2015-12-19: 20 mg via ORAL
  Filled 2015-12-19: qty 1

## 2015-12-19 MED ORDER — APIXABAN 2.5 MG PO TABS: 2.5000 mg | ORAL_TABLET | Freq: Two times a day (BID) | ORAL | Status: DC

## 2015-12-19 MED ORDER — TORSEMIDE 20 MG PO TABS: 20.0000 mg | ORAL_TABLET | Freq: Two times a day (BID) | ORAL | Status: DC

## 2015-12-19 MED ORDER — POTASSIUM CHLORIDE ER 20 MEQ PO TBCR: 20.0000 meq | EXTENDED_RELEASE_TABLET | Freq: Two times a day (BID) | ORAL | Status: DC

## 2015-12-19 MED ORDER — AMIODARONE HCL 200 MG PO TABS: 200.0000 mg | ORAL_TABLET | Freq: Two times a day (BID) | ORAL | Status: DC

## 2015-12-19 MED ORDER — SPIRONOLACTONE 25 MG PO TABS: 12.5000 mg | ORAL_TABLET | Freq: Every day | ORAL | Status: DC

## 2015-12-19 NOTE — Discharge Summary (Addendum)
Physician Discharge Summary  Mia Contreras MRN: [id number] DOB/AGE: 80-Mar-1930 80 y.o.  PCP: Walker Kehr, MD   Admit date: 12/14/2015 Discharge date: 12/19/2015  Discharge Diagnoses:     Active Problems:   hx: breast cancer, left, infiltrating ductal   Leg wound, left   Acute on chronic combined systolic and diastolic heart failure (HCC)   Acute kidney injury (HCC)   CKD (chronic kidney disease) stage 2, GFR 60-89 ml/min   Anxiety   IBS (irritable bowel syndrome)   Acute CHF ([place])    Follow-up recommendations Follow-up with PCP in 3-5 days , including all  additional recommended appointments as below Follow-up CBC, CMP in 3-5 days      Current Discharge Medication List    START taking these medications   Details  amiodarone (PACERONE) 200 MG tablet Take 1 tablet (200 mg total) by mouth 2 (two) times daily. Qty: 60 tablet, Refills: 0    apixaban (ELIQUIS) 2.5 MG TABS tablet Take 1 tablet (2.5 mg total) by mouth 2 (two) times daily. Qty: 60 tablet, Refills: 5    potassium chloride 20 MEQ TBCR Take 20 mEq by mouth 2 (two) times daily. Qty: 30 tablet, Refills: 0    spironolactone (ALDACTONE) 25 MG tablet Take 0.5 tablets (12.5 mg total) by mouth daily. Qty: 30 tablet, Refills: 0      CONTINUE these medications which have CHANGED   Details  torsemide (DEMADEX) 20 MG tablet Take 1 tablet (20 mg total) by mouth 2 (two) times daily. Qty: 60 tablet, Refills: 0      CONTINUE these medications which have NOT CHANGED   Details  ALPRAZolam (XANAX) 0.5 MG tablet Take 1 tablet (0.5 mg total) by mouth 2 (two) times daily as needed for anxiety or sleep. Qty: 60 tablet, Refills: 3    Calcium Carbonate-Vitamin D (CALCIUM 600+D) 600-400 MG-UNIT per tablet Take 1 tablet by mouth daily.    carvedilol (COREG) 12.5 MG tablet Take 1 tablet (12.5 mg total) by mouth 2 (two) times daily with a meal. Qty: 180 tablet, Refills: 3    Fluticasone-Salmeterol (ADVAIR DISKUS) 250-50  MCG/DOSE AEPB Inhale 1 puff into the lungs 2 (two) times daily. Qty: 60 each, Refills: 3    Multiple Vitamins-Minerals (MULTIVITAMIN PO) Take 1 tablet by mouth daily.       STOP taking these medications     aspirin 325 MG tablet      sacubitril-valsartan (ENTRESTO) 24-26 MG      diphenoxylate-atropine (LOMOTIL) 2.5-0.025 MG per tablet            Discharge Condition: * Stable    Discharge Instructions Get Medicines reviewed and adjusted: Please take all your medications with you for your next visit with your Primary MD  Please request your Primary MD to go over all hospital tests and procedure/radiological results at the follow up, please ask your Primary MD to get all Hospital records sent to his/her office.  If you experience worsening of your admission symptoms, develop shortness of breath, life threatening emergency, suicidal or homicidal thoughts you must seek medical attention immediately by calling 911 or calling your MD immediately if symptoms less severe.  You must read complete instructions/literature along with all the possible adverse reactions/side effects for all the Medicines you take and that have been prescribed to you. Take any new Medicines after you have completely understood and accpet all the possible adverse reactions/side effects.   Do not drive when taking Pain medications.   Do not  take more than prescribed Pain, Sleep and Anxiety Medications  Special Instructions: If you have smoked or chewed Tobacco in the last 2 yrs please stop smoking, stop any regular Alcohol and or any Recreational drug use.  Wear Seat belts while driving.  Please note  You were cared for by a hospitalist during your hospital stay. Once you are discharged, your primary care physician will handle any further medical issues. Please note that NO REFILLS for any discharge medications will be authorized once you are discharged, as it is imperative that you return to your  primary care physician (or establish a relationship with a primary care physician if you do not have one) for your aftercare needs so that they can reassess your need for medications and monitor your lab values.  Discharge Instructions    AMB Referral to [facility]    Complete by:  As directed   Please assign to [facility] for CHF disease and symptom management. Please see [facility] liaison notes for additional details. Written consent signed. Please call with questions. Marthenia Rolling, [place], [facility] [phone]  Reason for consult:  Please assign to [facility] RNCM  Diagnoses of:  Heart Failure  Expected date of contact:  1-3 days (reserved for hospital discharges)     Diet - low sodium heart healthy    Complete by:  As directed      Increase activity slowly    Complete by:  As directed             Allergies  Allergen Reactions  . Cefuroxime Diarrhea  . Celecoxib Nausea Only  . Codeine Nausea Only  . Deltasone [Prednisone]     Oral deltasone is causing swelling (can use Depo-Medrol)  . Ranitidine     bloating  . Tramadol Hcl Nausea Only      Disposition: 01-Home or Self Care   Consults:  HF team      Significant Diagnostic Studies:  Dg Chest 2 View  12/15/2015  CLINICAL DATA:  Shortness of breath, congestive heart failure EXAM: CHEST  2 VIEW COMPARISON:  10/20/2015 FINDINGS: Borderline cardiomegaly. Large left retrocardiac diaphragmatic/ hiatal hernia again noted. Persistent mild left basilar atelectasis or scarring. There is dextroscoliosis lower thoracic spine. No infiltrate or pulmonary edema. Surgical clips are noted in left axilla. Atherosclerotic calcifications of thoracic aorta. Mild degenerative changes and osteopenia thoracic spine. IMPRESSION: Large left retrocardiac diaphragmatic/ hiatal hernia again noted. Persistent mild left basilar atelectasis or scarring. There is dextroscoliosis lower thoracic spine. No infiltrate or pulmonary  edema. Electronically Signed   By: Lahoma Crocker M.D.   On: 12/15/2015 08:20   US Renal  12/14/2015  CLINICAL DATA:  Patient with congestive heart failure. EXAM: RENAL / URINARY TRACT ULTRASOUND COMPLETE COMPARISON:  MRI abdomen 12/13/2006 FINDINGS: Right Kidney: Length: 9.0 cm. Echogenicity within normal limits. No mass or hydronephrosis visualized. Left Kidney: Length: 9.9 cm. Echogenicity within normal limits. No mass or hydronephrosis visualized. Bladder: Decompressed and poorly evaluated. IMPRESSION: No hydronephrosis. Electronically Signed   By: Lovey Newcomer M.D.   On: 12/14/2015 18:33      Echo      ------------------------------------------------------------------- LV EF: 45% - 50%  ------------------------------------------------------------------- Indications: CHF - 428.0.  ------------------------------------------------------------------- History: PMH: Atrial fibrillation. Congestive heart failure. Risk factors: History of breast cancer. Chronic kidney disease. Non Ischemic Cardiomyopathy.  ------------------------------------------------------------------- Study Conclusions  - Left ventricle: The cavity size was normal. Wall thickness was  normal. Systolic function was mildly reduced. The estimated  ejection  fraction was in the range of 45% to 50%. Diffuse  hypokinesis. - Ventricular septum: The contour showed diastolic flattening and  systolic flattening. These changes are consistent with RV volume  and pressure overload. - Mitral valve: Calcified annulus. Mildly thickened leaflets .  There was mild regurgitation. - Left atrium: The atrium was moderately dilated. - Right ventricle: The cavity size was severely dilated. Systolic  function was moderately to severely reduced. - Right atrium: The atrium was severely dilated. - Tricuspid valve: There was malcoaptation of the valve leaflets.  There was severe regurgitation directed centrally. -  Pulmonary arteries: PA peak pressure: 77 mm Hg (S).  Impressions:  - Compared to last year&'s study, there is worsening pulmonary  hypertension and drastic worsening of tricuspid insufficiency and  right ventricular dysfuntion.  Filed Weights   12/17/15 0700 12/18/15 0500 12/19/15 0424  Weight: 63.73 kg (140 lb 8 oz) 62.823 kg (138 lb 8 oz) 60.827 kg (134 lb 1.6 oz)     Microbiology: No results found for this or any previous visit (from the past 240 hour(s)).     Blood Culture    Component Value Date/Time   SDES URINE, RANDOM 11/05/2012 1015   SPECREQUEST NONE 11/05/2012 1015   CULT KLEBSIELLA PNEUMONIAE 11/05/2012 1015   REPTSTATUS 11/06/2012 FINAL 11/05/2012 1015      Labs: Results for orders placed or performed during the hospital encounter of 12/14/15 (from the past 48 hour(s))  Comprehensive metabolic panel     Status: Abnormal   Collection Time: 12/18/15  5:09 AM  Result Value Ref Range   Sodium 141 135 - 145 mmol/L   Potassium 3.7 3.5 - 5.1 mmol/L   Chloride 94 (L) 101 - 111 mmol/L   CO2 33 (H) 22 - 32 mmol/L   Glucose, Bld 98 65 - 99 mg/dL   BUN 40 (H) 6 - 20 mg/dL   Creatinine, Ser 1.60 (H) 0.44 - 1.00 mg/dL   Calcium 9.4 8.9 - 10.3 mg/dL   Total Protein 5.6 (L) 6.5 - 8.1 g/dL   Albumin 3.1 (L) 3.5 - 5.0 g/dL   AST 39 15 - 41 U/L   ALT 39 14 - 54 U/L   Alkaline Phosphatase 52 38 - 126 U/L   Total Bilirubin 1.3 (H) 0.3 - 1.2 mg/dL   GFR calc non Af Amer 28 (L) >60 mL/min   GFR calc Af Amer 33 (L) >60 mL/min    Comment: (NOTE) The eGFR has been calculated using the CKD EPI equation. This calculation has not been validated in all clinical situations. eGFR's persistently <60 mL/min signify possible Chronic Kidney Disease.    Anion gap 14 5 - 15  CBC     Status: Abnormal   Collection Time: 12/18/15  5:09 AM  Result Value Ref Range   WBC 8.1 4.0 - 10.5 K/uL   RBC 3.82 (L) 3.87 - 5.11 MIL/uL   Hemoglobin 10.8 (L) 12.0 - 15.0 g/dL   HCT 34.0 (L)  36.0 - 46.0 %   MCV 89.0 78.0 - 100.0 fL   MCH 28.3 26.0 - 34.0 pg   MCHC 31.8 30.0 - 36.0 g/dL   RDW 15.7 (H) 11.5 - 15.5 %   Platelets 202 150 - 400 K/uL  Basic metabolic panel     Status: Abnormal   Collection Time: 12/19/15  1:58 AM  Result Value Ref Range   Sodium 140 135 - 145 mmol/L   Potassium 4.0 3.5 - 5.1 mmol/L   Chloride 94 (  L) 101 - 111 mmol/L   CO2 33 (H) 22 - 32 mmol/L   Glucose, Bld 94 65 - 99 mg/dL   BUN 40 (H) 6 - 20 mg/dL   Creatinine, Ser 0.68 (H) 0.44 - 1.00 mg/dL   Calcium 9.6 8.9 - 40.3 mg/dL   GFR calc non Af Amer 27 (L) >60 mL/min   GFR calc Af Amer 31 (L) >60 mL/min    Comment: (NOTE) The eGFR has been calculated using the CKD EPI equation. This calculation has not been validated in all clinical situations. eGFR's persistently <60 mL/min signify possible Chronic Kidney Disease.    Anion gap 13 5 - 15  CBC with Differential/Platelet     Status: Abnormal   Collection Time: 12/19/15  1:58 AM  Result Value Ref Range   WBC 7.4 4.0 - 10.5 K/uL   RBC 3.82 (L) 3.87 - 5.11 MIL/uL   Hemoglobin 10.6 (L) 12.0 - 15.0 g/dL   HCT 35.3 (L) 31.7 - 40.9 %   MCV 88.7 78.0 - 100.0 fL   MCH 27.7 26.0 - 34.0 pg   MCHC 31.3 30.0 - 36.0 g/dL   RDW 92.7 (H) 80.0 - 44.7 %   Platelets 196 150 - 400 K/uL   Neutrophils Relative % 69 %   Neutro Abs 5.1 1.7 - 7.7 K/uL   Lymphocytes Relative 20 %   Lymphs Abs 1.5 0.7 - 4.0 K/uL   Monocytes Relative 10 %   Monocytes Absolute 0.8 0.1 - 1.0 K/uL   Eosinophils Relative 1 %   Eosinophils Absolute 0.1 0.0 - 0.7 K/uL   Basophils Relative 0 %   Basophils Absolute 0.0 0.0 - 0.1 K/uL     Lipid Panel     Component Value Date/Time   CHOL 130 11/04/2015 1016   TRIG 77.0 11/04/2015 1016   TRIG 72 08/01/2006 0841   HDL 54.50 11/04/2015 1016   CHOLHDL 2 11/04/2015 1016   CHOLHDL 3.7 CALC 08/01/2006 0841   VLDL 15.4 11/04/2015 1016   LDLCALC 60 11/04/2015 1016     Lab Results  Component Value Date   HGBA1C 5.8 02/06/2014    HGBA1C 5.6 09/08/2009     Lab Results  Component Value Date   MICROALBUR 0.6 03/23/2011   LDLCALC 60 11/04/2015   CREATININE 1.67* 12/19/2015    82 y.o. female with medical history significant of thyroid dysfunction, HLD, anemia, HTN, venous insufficiency, chronic left leg wound, scoliosis, breast cancer, PAC, IBS presenting to heart failure clinic for initial intake examination. During visit it became a apparent that over the past 4 weeks patient has developed significant decompensation from a cardiac standpoint as well as some worsening renal function. Of note patient states she's had about 4 week history of progressive lower extremity edema extending from her ankles to her thighs. Patient states that this all started after she was treated for bronchitis with a Z-Pak and steroids. Patient has continued to take her torsemide 10 mg twice a day without any improvement in her urine output.  Started on IV lasix, diuresing well, improving, found to have Afib, started Eliquis  Assessment and plan: Acute on chronic combined systolic and diastolic congestive heart failure. Echo from 2016 shows EF of 30% and grade 1 diastolic dysfunction. Patient with four-week history of weight gain.  Dry weight 138 pounds, weight down to 138lbs now -per CHF team , IV lasix changed to PO Demadex 4/30, Increase dose to 20 mg BID ,  also got metolazone, also on  aldactone -9.8L negative -Repeat Echo EF 45-50% diffuse hypokinesis -monitor I/O, Dly wts - CHF order set utilized.  -creatinine stable  EF 45-50% with severe PH with PAPs ~90 (prior to diuresis). There is massive biatrial enlargement suggestive of restrictive CM. ECG suggestive of ischemic CM but not sure if she is good cath candidate at this point - given age, comorbidities and lack of angina cardiology favors Medical therapy. PT following recommend HHPT   Meds on d/c per Bensimhon, Daniel,MD Torsemide 20 bid (increased dose) Amio 200 bid  (new) Eliquis 2.5 bid (new) Spironolactone 12.5 daily (new) Carvedilol 12.5 bid Advair   Paroxysmal atrial fibrillation -starting Po amiodarone per Cards -new onset, started on Eliquis 4/26 , CHADSVASC 5 -on Coreg  AoCKD: Cr 2.47>1.67 improved with diuresis. Baseline 1.1. Suspect from cardiorenal syndrome and minimally from chronic diuretic use .  - Renal US negative   LLE wound: skin tear w/ [place] injury. Present for 7 weeks. Excellent outpt f/u at wound clinic. Last bandage change 1 day ago.  Patient being followed by wound care - Treatment of CHF as above will help w/ leg wound healing - prealbumin and nutrition consult - Pt/Ot (Numerous falls and LE wounds in past from running into household items. May need higher level of care at home/facility) PT recommended home health -Wound care FU on Monday at [place]: - continue xanax  Asthma/COPD: - continue advair  IBS: - continue Lomotil  L Breast Ca: s/p chemo 2008. No recurrence    Discharge Exam:    Blood pressure 101/60, pulse 86, temperature 98.7 F (37.1 C), temperature source Oral, resp. rate 16, height '5\' 3"'$  (1.6 m), weight 60.827 kg (134 lb 1.6 oz), SpO2 95 %.  General: Elderly. Lying in bed. No respiratory difficulty HEENT: normal Neck: supple. JVP 8. Carotids 2+ bilat; no bruits. No thyromegaly or nodule noted. Cor: PMI nondisplaced. Irregular rate & rhythm. No rubs, gallops or murmurs. Lungs: clear dull at left base Abdomen: soft, NT, ND, no HSM. No bruits or masses. +BS  Extremities: no cyanosis, clubbing, rash, 1+ edema into thighs. Left leg wrapped with gauze due to wound Neuro: alert & oriented x 3, cranial nerves grossly intact. moves all 4 extremities w/o difficulty. Affect pleasant     Follow-up Information    Follow up with Walker Kehr, MD. Schedule an appointment as soon as possible for a visit in 3 days.   Specialty:  Internal Medicine   Why:  hospital follow-up   Contact  information:   [place] [id number] [phone]       Follow up with Glori Bickers, MD. Schedule an appointment as soon as possible for a visit in 1 week.   Specialty:  Cardiology   Contact information:   [place] [address] [phone]       Signed: Reyne Contreras 12/19/2015, 1:15 PM        Time spent >45 mins

## 2015-12-19 NOTE — Progress Notes (Signed)
[facility] PROGRESS NOTE  Mia Contreras [id number] DOB: 04-Jul-1929 DOA: 12/14/2015   PCP: Walker Kehr, MD  Narrative: 82 y.o. female with medical history significant of thyroid dysfunction, HLD, anemia, HTN, venous insufficiency, chronic left leg wound, scoliosis, breast cancer, PAC, IBS presenting to heart failure clinic for initial intake examination. During visit it became a apparent that over the past 4 weeks patient has developed significant decompensation from a cardiac standpoint as well as some worsening renal function. Of note patient states she's had about 4 week history of progressive lower extremity edema extending from her ankles to her thighs. Patient states that this all started after she was treated for bronchitis with a Z-Pak and steroids. Patient has continued to take her torsemide 10 mg twice a day without any improvement in her urine output.  Started on IV lasix, diuresing well, improving, found to have Afib, started Eliquis  Assessment and plan:  Acute on chronic combined systolic and diastolic congestive heart failure. Echo from 2016 shows EF of 30% and grade 1 diastolic dysfunction. Patient with four-week history of weight gain.  Dry weight 138 pounds, weight down to 138lbs now -per CHF team , IV lasix changed to PO Demadex 4/30, Increase dose to 20 mg a day, on  10 mg a day at home, also got metolazone, also on aldactone -9.8L negative -Repeat Echo EF 45-50% diffuse hypokinesis -monitor I/O, Dly wts - CHF order set utilized.  -creatinine stable  EF 45-50% with severe PH with PAPs ~90 (prior to diuresis). There is massive biatrial enlargement suggestive of restrictive CM. ECG suggestive of ischemic CM but not sure if she is good cath candidate at this point - given age, comorbidities and lack of angina cardiology favors  Medical  therapy. PT following recommend HHPT Blood pressure soft , may need monitoring overnight for further titration of  medications   Paroxysmal atrial fibrillation -starting Po amiodarone per Cards -new onset, started on Eliquis  4/26 , CHADSVASC 5 -on Coreg  AoCKD: Cr 2.47>1.67  improved with diuresis. Baseline 1.1. Suspect from cardiorenal syndrome and minimally from chronic diuretic use .  - Renal US negative    LLE wound: skin tear w/ [place] injury. Present for 7 weeks. Excellent outpt f/u at wound clinic. Last bandage change 1 day ago.  Patient being followed by wound care - Treatment of CHF as above will help w/ leg wound healing - prealbumin and nutrition consult - Pt/Ot (Numerous falls and LE wounds in past from running into household items. May need higher level of care at home/facility) PT recommended home health -Wound care FU on Monday at [facility]: - continue xanax  Asthma/COPD: - continue advair  IBS: - continue Lomotil  L Breast Ca: s/p chemo 2008. No recurrence  DVT prophylaxsis : on apixaban  Code Status:  Full code  Family Communication: None at bedside Disposition Plan:  Home tomorrow once cleared by cards   Consultants:  None  Procedures:  None  Antibiotics: Anti-infectives    None         HPI/Subjective: Patient is extremely tearful and anxious today, Would like to go home  Objective: Filed Vitals:   12/18/15 1943 12/18/15 2147 12/19/15 0424 12/19/15 0748  BP:  98/47 95/56   Pulse:  82 87 86  Temp:  98 F (36.7 C) 98.7 F (37.1 C)   TempSrc:  Oral Oral   Resp:  16 16 16   Height:  Weight:   60.827 kg (134 lb 1.6 oz)   SpO2: 92% 90% 97% 97%    Intake/Output Summary (Last 24 hours) at 12/19/15 [id number] Last data filed at 12/19/15 0900  Gross per 24 hour  Intake    360 ml  Output   2100 ml  Net  -1740 ml    Exam:  Examination:  General: Elderly. Lying in bed. No respiratory difficulty HEENT: normal Neck: supple. JVP 8. Carotids 2+ bilat; no bruits. No thyromegaly or nodule noted. Cor: PMI nondisplaced. Irregular rate &  rhythm. No rubs, gallops or murmurs. Lungs: clear dull at left base Abdomen: soft, NT, ND, no HSM. No bruits or masses. +BS  Extremities: no cyanosis, clubbing, rash, 1+ edema into thighs. Left leg wrapped with gauze due to wound Neuro: alert & oriented x 3, cranial nerves grossly intact. moves all 4 extremities w/o difficulty. Affect pleasant   Data Reviewed: I have personally reviewed following labs and imaging studies  Micro Results No results found for this or any previous visit (from the past 240 hour(s)).  Radiology Reports Dg Chest 2 View  12/15/2015  CLINICAL DATA:  Shortness of breath, congestive heart failure EXAM: CHEST  2 VIEW COMPARISON:  10/20/2015 FINDINGS: Borderline cardiomegaly. Large left retrocardiac diaphragmatic/ hiatal hernia again noted. Persistent mild left basilar atelectasis or scarring. There is dextroscoliosis lower thoracic spine. No infiltrate or pulmonary edema. Surgical clips are noted in left axilla. Atherosclerotic calcifications of thoracic aorta. Mild degenerative changes and osteopenia thoracic spine. IMPRESSION: Large left retrocardiac diaphragmatic/ hiatal hernia again noted. Persistent mild left basilar atelectasis or scarring. There is dextroscoliosis lower thoracic spine. No infiltrate or pulmonary edema. Electronically Signed   By: Lahoma Crocker M.D.   On: 12/15/2015 08:20   US Renal  12/14/2015  CLINICAL DATA:  Patient with congestive heart failure. EXAM: RENAL / URINARY TRACT ULTRASOUND COMPLETE COMPARISON:  MRI abdomen 12/13/2006 FINDINGS: Right Kidney: Length: 9.0 cm. Echogenicity within normal limits. No mass or hydronephrosis visualized. Left Kidney: Length: 9.9 cm. Echogenicity within normal limits. No mass or hydronephrosis visualized. Bladder: Decompressed and poorly evaluated. IMPRESSION: No hydronephrosis. Electronically Signed   By: Lovey Newcomer M.D.   On: 12/14/2015 18:33     CBC  Recent Labs Lab 12/14/15 1337 12/15/15 0315 12/16/15 0246  12/18/15 0509 12/19/15 0158  WBC 8.2 6.4 6.6 8.1 7.4  HGB 11.3* 9.8* 10.0* 10.8* 10.6*  HCT 36.5 31.9* 32.5* 34.0* 33.9*  PLT 277 215 213 202 196  MCV 87.5 87.4 88.1 89.0 88.7  MCH 27.1 26.8 27.1 28.3 27.7  MCHC 31.0 30.7 30.8 31.8 31.3  RDW 15.2 15.3 15.4 15.7* 15.7*  LYMPHSABS  --   --   --   --  1.5  MONOABS  --   --   --   --  0.8  EOSABS  --   --   --   --  0.1  BASOSABS  --   --   --   --  0.0    Chemistries   Recent Labs Lab 12/14/15 1337 12/15/15 0315 12/16/15 0246 12/17/15 0416 12/18/15 0509 12/19/15 0158  NA 141 141 141 142 141 140  K 4.9 4.1 4.0 3.6 3.7 4.0  CL 99* 101 101 97* 94* 94*  CO2 29 29 28  34* 33* 33*  GLUCOSE 115* 109* 106* 104* 98 94  BUN 80* 73* 64* 49* 40* 40*  CREATININE 2.24* 1.99* 1.81* 1.63* 1.60* 1.67*  CALCIUM 9.9 9.0 8.7* 8.9 9.4 9.6  MG  2.3  --   --   --   --   --   AST 28  --  26  --  39  --   ALT 28  --  24  --  39  --   ALKPHOS 60  --  48  --  52  --   BILITOT 1.0  --  0.5  --  1.3*  --    ------------------------------------------------------------------------------------------------------------------ estimated creatinine clearance is 20.7 mL/min (by C-G formula based on Cr of 1.67). ------------------------------------------------------------------------------------------------------------------ No results for input(s): HGBA1C in the last 72 hours. ------------------------------------------------------------------------------------------------------------------ No results for input(s): CHOL, HDL, LDLCALC, TRIG, CHOLHDL, LDLDIRECT in the last 72 hours. ------------------------------------------------------------------------------------------------------------------ No results for input(s): TSH, T4TOTAL, T3FREE, THYROIDAB in the last 72 hours.  Invalid input(s): FREET3 ------------------------------------------------------------------------------------------------------------------ No results for input(s): VITAMINB12, FOLATE,  FERRITIN, TIBC, IRON, RETICCTPCT in the last 72 hours.  Coagulation profile No results for input(s): INR, PROTIME in the last 168 hours.  No results for input(s): DDIMER in the last 72 hours.  Cardiac Enzymes  Recent Labs Lab 12/14/15 1337  TROPONINI 0.04*   ------------------------------------------------------------------------------------------------------------------ Invalid input(s): POCBNP   CBG: No results for input(s): GLUCAP in the last 168 hours.     Studies: No results found.    Lab Results  Component Value Date   HGBA1C 5.8 02/06/2014   HGBA1C 5.6 09/08/2009   Lab Results  Component Value Date   MICROALBUR 0.6 03/23/2011   LDLCALC 60 11/04/2015   CREATININE 1.67* 12/19/2015       Scheduled Meds: . amiodarone  200 mg Oral BID  . apixaban  2.5 mg Oral BID  . carvedilol  12.5 mg Oral BID WC  . feeding supplement (ENSURE ENLIVE)  237 mL Oral BID BM  . mometasone-formoterol  2 puff Inhalation BID  . potassium chloride  20 mEq Oral BID  . sodium chloride flush  3 mL Intravenous Q12H  . sodium chloride flush  3 mL Intravenous Q12H  . spironolactone  12.5 mg Oral Daily  . torsemide  20 mg Oral Daily   Continuous Infusions:    LOS: 5 days    Time spent: >30 MINS    [facility]  [facility] Pager [phone] If 7PM-7AM, please contact night-coverage at [email or web address], password [facility] 12/19/2015, 9:38 AM  LOS: 5 days

## 2015-12-19 NOTE — Care Management Note (Addendum)
Case Management Note  Patient Details  Name: Mia Contreras MRN: [id number] Date of Birth: September 06, 1928  Subjective/Objective:                  Acute on chronic combined systolic and diastolic congestive heart failure.  Action/Plan: CM spoke with patient and niece regarding discharge plan and offered patient choice for [facility] services. Patient chose [facility] PT/OT/RN/Aide and her daughter will be there for the first 24 hours to assist. Cm spoke about following days and patient states that she plans to have intermittent assistance and does not need any further DME at this time (patient has RW, 3N1 and does not feel any additional DME is needed). CM called Tiffany with [facility] to advise of referral and referral accepted. No further CM Needs communicated at this time.   Expected Discharge Date:  12/19/15               Expected Discharge Plan:  [place]  In-House Referral:  Clinical Social Work  Discharge planning Services  CM Consult  Post Acute Care Choice:  Home Health Choice offered to:  Patient, Adult Children  DME Arranged:    DME Agency:  [place] Arranged:  RN, PT, OT, Nurse's Aide [place] Agency:  [place]  Status of Service:  Completed, signed off  Medicare Important Message Given:  Yes Date Medicare IM Given:    Medicare IM give by:    Date Additional Medicare IM Given:    Additional Medicare Important Message give by:     If discussed at [place] of Stay Meetings, dates discussed:    Additional Comments:  Guido Sander, RN 12/19/2015, 5:14 PM

## 2015-12-19 NOTE — Progress Notes (Signed)
Spoke with PA Eulas Post to discuss evaluation of patient today.  Patient is anticipating discharge and niece lives in [place] and wants to know what the plan is and if she can come pick her up. PA will advise. Pt resting with call bell within reach.  Will continue to monitor. Payton Emerald, RN

## 2015-12-19 NOTE — Progress Notes (Signed)
Patient states she is not new to atrial fibrillation.  Patient states she went into afib in 2000 after breast cancer treatment. Payton Emerald, RN

## 2015-12-19 NOTE — Clinical Social Work Note (Signed)
Clinical Social Work Assessment  Patient Details  Name: Mia Contreras MRN: [id number] Date of Birth: [birth year]  Date of referral:  12/19/15               Reason for consult:  Care Management Concerns                Permission sought to share information with:  Family Supports Permission granted to share information::  Yes, Verbal Permission Granted  Name::     Diplomatic Services operational officer::     Relationship::  niece  Contact Information:     Housing/Transportation Living arrangements for the past 2 months:  Single Family Home Source of Information:  Patient Patient Interpreter Needed:  None Criminal Activity/Legal Involvement Pertinent to Current Situation/Hospitalization:  No - Comment as needed Significant Relationships:  Other Family Members Lives with:  Self Do you feel safe going back to the place where you live?  Yes Need for family participation in patient care:  Yes (Comment)  Care giving concerns:  PT recommends HH at DC.   Social Worker assessment / plan:  CSW met with pt and niece at bedside to answer questions about community resources. Pt lives home alone and plans to DC back home. Niece and other family members live out of town and assist as needed. CSW provided information for [employer] [place] and SCAT application. Pt open to accepting more help from the community so that she can stay in her home.  CSW alerted RN that SCAT application was completed and no barriers to DC from [place] stand point.   Employment status:  Retired Health visitor PT Recommendations:  Home with [place] / Referral to community resources:  Adult Daycare, Other (Comment Required) (SCAT, Tax adviser )  Patient/Family's Response to care:  Pt alert and oriented and engaged in assessment.  Patient/Family's Understanding of and Emotional Response to Diagnosis, Current Treatment, and Prognosis:  Pt is determined to stay in her home. Pt is open to  receiving assistance as long as she can maintain her independence.  Emotional Assessment Appearance:  Appears stated age Attitude/Demeanor/Rapport:  Other (Pleasant) Affect (typically observed):  Appropriate Orientation:  Oriented to Self, Oriented to Place, Oriented to  Time, Oriented to Situation Alcohol / Substance use:  Not Applicable Psych involvement (Current and /or in the community):  No (Comment)  Discharge Needs  Concerns to be addressed:  No discharge needs identified Readmission within the last 30 days:  No Current discharge risk:  None Barriers to Discharge:  No Barriers Identified   Boone Master, [place] 12/19/2015, 2:55 PM Weekend Coverage

## 2015-12-19 NOTE — Progress Notes (Signed)
Pt to be discharged. Awaiting case management to set up home health PT, OT, and aide

## 2015-12-19 NOTE — Progress Notes (Signed)
Patient ambulated in the hallway without oxygen approximately 150 feet without any issues.  Oxygen saturations were 92% on room air at rest and 90-94% during ambulation. Pt resting with call bell within reach.  Will continue to monitor. Payton Emerald, RN

## 2015-12-19 NOTE — Progress Notes (Signed)
Advanced Heart Failure Rounding Note    HPI:    Mia Contreras is a 82 y.o. female with a history of nonischemic cardiomyopathy, systolic CHF, CKD 3-4, L Breast cancer in 2000 admitted with CHF  Eager to go home. IV lasix stopped yesterday. Weight down another 4 pounds.   Objective:    Vital Signs:   Temp:  [98 F (36.7 C)-98.7 F (37.1 C)] 98.7 F (37.1 C) (04/30 0424) Pulse Rate:  [82-97] 86 (04/30 0954) Resp:  [16] 16 (04/30 0748) BP: (95-101)/(47-68) 101/60 mmHg (04/30 0954) SpO2:  [90 %-97 %] 95 % (04/30 0954) Weight:  [60.827 kg (134 lb 1.6 oz)] 60.827 kg (134 lb 1.6 oz) (04/30 0424) Last BM Date: 12/18/15  Weight change: Filed Weights   12/17/15 0700 12/18/15 0500 12/19/15 0424  Weight: 63.73 kg (140 lb 8 oz) 62.823 kg (138 lb 8 oz) 60.827 kg (134 lb 1.6 oz)    Intake/Output:   Intake/Output Summary (Last 24 hours) at 12/19/15 1303 Last data filed at 12/19/15 0900  Gross per 24 hour  Intake    120 ml  Output   1500 ml  Net  -1380 ml     Physical Exam: General: Elderly. Sitting up in chair. No respiratory difficulty HEENT: normal Neck: supple. JVP 6. Carotids 2+ bilat; no bruits. No thyromegaly or nodule noted. Cor: PMI nondisplaced. Irregular rate & rhythm. No rubs, gallops or murmurs. Lungs: clear dull at left base Abdomen: soft, NT, ND, no HSM. No bruits or masses. +BS  Extremities: no cyanosis, clubbing, rash, 1+ edema into thighs. Left leg wrapped with gauze due to wound Neuro: alert & oriented x 3, cranial nerves grossly intact. moves all 4 extremities w/o difficulty. Affect pleasant.  Telemetry: Afib 80-90(reviewed personally)  Labs: Basic Metabolic Panel:  Recent Labs Lab 12/14/15 1337 12/15/15 0315 12/16/15 0246 12/17/15 0416 12/18/15 0509 12/19/15 0158  NA 141 141 141 142 141 140  K 4.9 4.1 4.0 3.6 3.7 4.0  CL 99* 101 101 97* 94* 94*  CO2 29 29 28  34* 33* 33*  GLUCOSE 115* 109* 106* 104* 98 94  BUN 80* 73* 64* 49* 40* 40*    CREATININE 2.24* 1.99* 1.81* 1.63* 1.60* 1.67*  CALCIUM 9.9 9.0 8.7* 8.9 9.4 9.6  MG 2.3  --   --   --   --   --   PHOS 4.3  --   --   --   --   --     Liver Function Tests:  Recent Labs Lab 12/14/15 1337 12/16/15 0246 12/18/15 0509  AST 28 26 39  ALT 28 24 39  ALKPHOS 60 48 52  BILITOT 1.0 0.5 1.3*  PROT 6.4* 5.2* 5.6*  ALBUMIN 3.6 3.0* 3.1*   No results for input(s): LIPASE, AMYLASE in the last 168 hours. No results for input(s): AMMONIA in the last 168 hours.  CBC:  Recent Labs Lab 12/14/15 1337 12/15/15 0315 12/16/15 0246 12/18/15 0509 12/19/15 0158  WBC 8.2 6.4 6.6 8.1 7.4  NEUTROABS  --   --   --   --  5.1  HGB 11.3* 9.8* 10.0* 10.8* 10.6*  HCT 36.5 31.9* 32.5* 34.0* 33.9*  MCV 87.5 87.4 88.1 89.0 88.7  PLT 277 215 213 202 196    Cardiac Enzymes:  Recent Labs Lab 12/14/15 1337  TROPONINI 0.04*    BNP: BNP (last 3 results)  Recent Labs  12/14/15 1337  BNP 1357.2*    ProBNP (last 3 results)  Recent Labs  12/01/15 1052  PROBNP 1090.0*     CBG: No results for input(s): GLUCAP in the last 168 hours.  Coagulation Studies: No results for input(s): LABPROT, INR in the last 72 hours.  Other results: EKG: 12/09/15 NSR with 1st degree block, 63 bpm -> now AF  Imaging: No results found.   Medications:     Current Medications: . amiodarone  200 mg Oral BID  . apixaban  2.5 mg Oral BID  . carvedilol  12.5 mg Oral BID WC  . feeding supplement (ENSURE ENLIVE)  237 mL Oral BID BM  . mometasone-formoterol  2 puff Inhalation BID  . potassium chloride  20 mEq Oral BID  . sodium chloride flush  3 mL Intravenous Q12H  . sodium chloride flush  3 mL Intravenous Q12H  . spironolactone  12.5 mg Oral Daily  . torsemide  20 mg Oral Daily    Infusions:     Assessment   1. Acute on chronic diastolic/systolic HF with R>>L symptoms. EF 45-50% 2. Acute on chronic renal failure, stage 4 3. LE wounds 4. H/o COPD 5. H/o breast cancer s/p  chemo 2000 6. A fib, new onset    -CHADSVASC 5 7. [place]  Plan    She has diuresed almost 20 pounds. Weight now below baseline. Renal function stable. Remains in AF. Now on amio for rate control.   Echo reviewed and EF 45-50% with severe PH with PAPs ~90 (prior to diuresis). There is massive biatrial enlargement suggestive of restrictive CM. ECG suggestive of ischemic CM but not sure if she is good cath candidate at this point - given age, comorbidities and lack of angina I favor medica therapy. PT following recommend HHPT  She has new onset AF. Eliquis started 4/26. So far appears to be tolerating AF. Rate controlled on po amio.  LEs wrapped for wounds  She is adamant on going home today and her niece is here to help so I think it is ok to go today with close f/u  Meds on d/c  Torsemide 20 bid (increased dose) Amio 200 bid  (new) Eliquis 2.5 bid (new) Spironolactone 12.5 daily (new) Carvedilol 12.5 bid Advair   STOP: Entresto and ASA  She will need: 1) HHPT and RN follow-up 2) Follow up in [facility] tomorrow 3) HF appointment in one week with echo  4) Possible paramedicine follow up   Mia Viramontes,MD 1:03 PM Advanced Heart Failure Team Pager [phone] (M-F; 7a - 4p)  Please contact [facility] for night-coverage after hours (4p -7a ) and weekends on [email or web address]

## 2015-12-20 ENCOUNTER — Other Ambulatory Visit: Payer: Self-pay

## 2015-12-20 ENCOUNTER — Telehealth: Payer: Self-pay | Admitting: *Deleted

## 2015-12-20 ENCOUNTER — Telehealth (HOSPITAL_COMMUNITY): Payer: Self-pay | Admitting: Vascular Surgery

## 2015-12-20 ENCOUNTER — Encounter (HOSPITAL_BASED_OUTPATIENT_CLINIC_OR_DEPARTMENT_OTHER): Payer: Medicare Other | Attending: Internal Medicine

## 2015-12-20 DIAGNOSIS — L97821 Non-pressure chronic ulcer of other part of left lower leg limited to breakdown of skin: Secondary | ICD-10-CM | POA: Insufficient documentation

## 2015-12-20 DIAGNOSIS — J449 Chronic obstructive pulmonary disease, unspecified: Secondary | ICD-10-CM | POA: Insufficient documentation

## 2015-12-20 DIAGNOSIS — I872 Venous insufficiency (chronic) (peripheral): Secondary | ICD-10-CM | POA: Insufficient documentation

## 2015-12-20 DIAGNOSIS — Z923 Personal history of irradiation: Secondary | ICD-10-CM | POA: Insufficient documentation

## 2015-12-20 DIAGNOSIS — I509 Heart failure, unspecified: Secondary | ICD-10-CM

## 2015-12-20 DIAGNOSIS — Z9221 Personal history of antineoplastic chemotherapy: Secondary | ICD-10-CM | POA: Insufficient documentation

## 2015-12-20 DIAGNOSIS — M1990 Unspecified osteoarthritis, unspecified site: Secondary | ICD-10-CM | POA: Insufficient documentation

## 2015-12-20 DIAGNOSIS — I130 Hypertensive heart and chronic kidney disease with heart failure and stage 1 through stage 4 chronic kidney disease, or unspecified chronic kidney disease: Secondary | ICD-10-CM | POA: Insufficient documentation

## 2015-12-20 DIAGNOSIS — L97811 Non-pressure chronic ulcer of other part of right lower leg limited to breakdown of skin: Secondary | ICD-10-CM | POA: Insufficient documentation

## 2015-12-20 DIAGNOSIS — N183 Chronic kidney disease, stage 3 (moderate): Secondary | ICD-10-CM | POA: Insufficient documentation

## 2015-12-20 NOTE — Telephone Encounter (Signed)
Transition Care Management Follow-up Telephone Call   Date discharged? 12/19/15   How have you been since you were released from the hospital? Spoke with niece [name]) she states that aunt is doing ok   Do you understand why you were in the hospital? YES   Do you understand the discharge instructions? YES   Where were you discharged to? Home   Items Reviewed:  Medications reviewed: YES  Allergies reviewed: YES  Dietary changes reviewed: NO  Referrals reviewed: No referral needed   Functional Questionnaire:   Activities of Daily Living (ADLs):   She states she are independent in the following: bathing and hygiene, feeding, continence, grooming, toileting and dressing States she require assistance with the following: ambulation   Any transportation issues/concerns?: NO, she stated she will be at work but her son will be bringing her to the appt   Any patient concerns? NO   Confirmed importance and date/time of follow-up visits scheduled YES, appt 12/24/15  Provider Appointment booked with Dr. Alain Marion   Confirmed with patient if condition begins to worsen call PCP or go to the ER.  Patient was given the office number and encouraged to call back with question or concerns.  : YES

## 2015-12-20 NOTE — Telephone Encounter (Signed)
Returned pt nieces call to make hosp f/u appt

## 2015-12-20 NOTE — Patient Outreach (Signed)
[facility]) Care Management  12/20/2015  Apurva Garofalo [place]arc[place] [birth year] [id number]  82 year old with recent admission 4/25-4/30 with heart failure. Member reports she is without shortness of breath, denies swelling. Member reports she has her follow up appointments scheduled. Main concern is cost of her Eliquis. Her nephew and friends are taking her to her follow up appointments. Member reports she is weighing herself daily. RNCM reviewed parameters for when to call the doctor.  RNCM provided contact number RNCM provided 24hour nurse advice line contact number.  Plan: home visit with the next 1-2 weeks.  Thea Silversmith, RN, MSN, [place] Coordinator Cell: [phone]

## 2015-12-21 ENCOUNTER — Ambulatory Visit (HOSPITAL_COMMUNITY)
Admission: RE | Admit: 2015-12-21 | Discharge: 2015-12-21 | Disposition: A | Discharge status: Home / Self Care | Payer: Medicare Other | Source: Ambulatory Visit | Attending: Vascular Surgery | Admitting: Vascular Surgery

## 2015-12-21 ENCOUNTER — Other Ambulatory Visit: Payer: Self-pay | Admitting: Internal Medicine

## 2015-12-21 DIAGNOSIS — I10 Essential (primary) hypertension: Secondary | ICD-10-CM | POA: Diagnosis not present

## 2015-12-21 DIAGNOSIS — L97929 Non-pressure chronic ulcer of unspecified part of left lower leg with unspecified severity: Secondary | ICD-10-CM | POA: Insufficient documentation

## 2015-12-21 DIAGNOSIS — R938 Abnormal findings on diagnostic imaging of other specified body structures: Secondary | ICD-10-CM | POA: Diagnosis not present

## 2015-12-21 DIAGNOSIS — E785 Hyperlipidemia, unspecified: Secondary | ICD-10-CM | POA: Insufficient documentation

## 2015-12-23 DIAGNOSIS — N182 Chronic kidney disease, stage 2 (mild): Secondary | ICD-10-CM | POA: Diagnosis not present

## 2015-12-23 DIAGNOSIS — I480 Paroxysmal atrial fibrillation: Secondary | ICD-10-CM | POA: Diagnosis not present

## 2015-12-23 DIAGNOSIS — I5043 Acute on chronic combined systolic (congestive) and diastolic (congestive) heart failure: Secondary | ICD-10-CM | POA: Diagnosis not present

## 2015-12-23 DIAGNOSIS — M419 Scoliosis, unspecified: Secondary | ICD-10-CM | POA: Diagnosis not present

## 2015-12-23 DIAGNOSIS — Z853 Personal history of malignant neoplasm of breast: Secondary | ICD-10-CM | POA: Diagnosis not present

## 2015-12-23 DIAGNOSIS — I872 Venous insufficiency (chronic) (peripheral): Secondary | ICD-10-CM | POA: Diagnosis not present

## 2015-12-23 DIAGNOSIS — I130 Hypertensive heart and chronic kidney disease with heart failure and stage 1 through stage 4 chronic kidney disease, or unspecified chronic kidney disease: Secondary | ICD-10-CM | POA: Diagnosis not present

## 2015-12-23 DIAGNOSIS — F419 Anxiety disorder, unspecified: Secondary | ICD-10-CM | POA: Diagnosis not present

## 2015-12-23 DIAGNOSIS — J449 Chronic obstructive pulmonary disease, unspecified: Secondary | ICD-10-CM | POA: Diagnosis not present

## 2015-12-23 DIAGNOSIS — E785 Hyperlipidemia, unspecified: Secondary | ICD-10-CM | POA: Diagnosis not present

## 2015-12-24 ENCOUNTER — Ambulatory Visit (INDEPENDENT_AMBULATORY_CARE_PROVIDER_SITE_OTHER): Payer: Medicare Other | Admitting: Internal Medicine

## 2015-12-24 ENCOUNTER — Encounter: Payer: Self-pay | Admitting: Internal Medicine

## 2015-12-24 VITALS — BP 108/60 | HR 99 | Wt 131.0 lb

## 2015-12-24 DIAGNOSIS — I509 Heart failure, unspecified: Secondary | ICD-10-CM | POA: Diagnosis not present

## 2015-12-24 DIAGNOSIS — L97811 Non-pressure chronic ulcer of other part of right lower leg limited to breakdown of skin: Secondary | ICD-10-CM | POA: Diagnosis not present

## 2015-12-24 DIAGNOSIS — Z923 Personal history of irradiation: Secondary | ICD-10-CM | POA: Diagnosis not present

## 2015-12-24 DIAGNOSIS — S81802A Unspecified open wound, left lower leg, initial encounter: Secondary | ICD-10-CM | POA: Diagnosis not present

## 2015-12-24 DIAGNOSIS — L97821 Non-pressure chronic ulcer of other part of left lower leg limited to breakdown of skin: Secondary | ICD-10-CM | POA: Diagnosis not present

## 2015-12-24 DIAGNOSIS — I5043 Acute on chronic combined systolic (congestive) and diastolic (congestive) heart failure: Secondary | ICD-10-CM | POA: Diagnosis not present

## 2015-12-24 DIAGNOSIS — N183 Chronic kidney disease, stage 3 (moderate): Secondary | ICD-10-CM | POA: Diagnosis not present

## 2015-12-24 DIAGNOSIS — F419 Anxiety disorder, unspecified: Secondary | ICD-10-CM

## 2015-12-24 DIAGNOSIS — I480 Paroxysmal atrial fibrillation: Secondary | ICD-10-CM | POA: Diagnosis not present

## 2015-12-24 DIAGNOSIS — Z9221 Personal history of antineoplastic chemotherapy: Secondary | ICD-10-CM | POA: Diagnosis not present

## 2015-12-24 DIAGNOSIS — I872 Venous insufficiency (chronic) (peripheral): Secondary | ICD-10-CM | POA: Diagnosis not present

## 2015-12-24 DIAGNOSIS — M1990 Unspecified osteoarthritis, unspecified site: Secondary | ICD-10-CM | POA: Diagnosis not present

## 2015-12-24 DIAGNOSIS — J449 Chronic obstructive pulmonary disease, unspecified: Secondary | ICD-10-CM | POA: Diagnosis not present

## 2015-12-24 DIAGNOSIS — I130 Hypertensive heart and chronic kidney disease with heart failure and stage 1 through stage 4 chronic kidney disease, or unspecified chronic kidney disease: Secondary | ICD-10-CM | POA: Diagnosis not present

## 2015-12-24 MED ORDER — APIXABAN 2.5 MG PO TABS: 2.5000 mg | ORAL_TABLET | Freq: Two times a day (BID) | ORAL | Status: DC

## 2015-12-24 MED ORDER — PROMETHAZINE-CODEINE 6.25-10 MG/5ML PO SYRP: 2.5000 mL | ORAL_SOLUTION | ORAL | Status: DC | PRN

## 2015-12-24 NOTE — Assessment & Plan Note (Signed)
Better ECHO reviewed Torsemide, Spironolactone

## 2015-12-24 NOTE — Assessment & Plan Note (Signed)
Xanax prn  Potential benefits of a long term benzodiazepines  use as well as potential risks  and complications were explained to the patient and were aknowledged. 

## 2015-12-24 NOTE — Progress Notes (Signed)
Pre visit review using our clinic review tool, if applicable. No additional management support is needed unless otherwise documented below in the visit note. 

## 2015-12-24 NOTE — Assessment & Plan Note (Signed)
5/17 Eliquis is $400 [facility]mad[facility]

## 2015-12-24 NOTE — Progress Notes (Signed)
Subjective:  Patient ID: Mia Contreras, female    DOB: 10-Oct-1928  Age: 82 y.o. MRN: [id number]  CC: No chief complaint on file.   HPI Mia Contreras presents for post-hosp f/u - CHF, edema, leg wounds and HTN d/c 12/19/15. C/o cough  82 y.o. female with medical history significant of thyroid dysfunction, HLD, anemia, HTN, venous insufficiency, chronic left leg wound, scoliosis, breast cancer, PAC, IBS presenting to heart failure clinic for initial intake examination. During visit it became a apparent that over the past 4 weeks patient has developed significant decompensation from a cardiac standpoint as well as some worsening renal function. Of note patient states she's had about 4 week history of progressive lower extremity edema extending from her ankles to her thighs. Patient states that this all started after she was treated for bronchitis with a Z-Pak and steroids. Patient has continued to take her torsemide 10 mg twice a day without any improvement in her urine output. Better after her hospital stay...  C/o Eliquis is $400  Outpatient Prescriptions Prior to Visit  Medication Sig Dispense Refill  . ALPRAZolam (XANAX) 0.5 MG tablet Take 1 tablet (0.5 mg total) by mouth 2 (two) times daily as needed for anxiety or sleep. 60 tablet 3  . amiodarone (PACERONE) 200 MG tablet Take 1 tablet (200 mg total) by mouth 2 (two) times daily. 60 tablet 0  . apixaban (ELIQUIS) 2.5 MG TABS tablet Take 1 tablet (2.5 mg total) by mouth 2 (two) times daily. 60 tablet 5  . Calcium Carbonate-Vitamin D (CALCIUM 600+D) 600-400 MG-UNIT per tablet Take 1 tablet by mouth daily.    . carvedilol (COREG) 12.5 MG tablet Take 1 tablet (12.5 mg total) by mouth 2 (two) times daily with a meal. 180 tablet 3  . Fluticasone-Salmeterol (ADVAIR DISKUS) 250-50 MCG/DOSE AEPB Inhale 1 puff into the lungs 2 (two) times daily. 60 each 3  . Multiple Vitamins-Minerals (MULTIVITAMIN PO) Take 1 tablet by mouth daily.     .  potassium chloride 20 MEQ TBCR Take 20 mEq by mouth 2 (two) times daily. 30 tablet 0  . spironolactone (ALDACTONE) 25 MG tablet Take 0.5 tablets (12.5 mg total) by mouth daily. 30 tablet 0  . torsemide (DEMADEX) 20 MG tablet Take 1 tablet (20 mg total) by mouth 2 (two) times daily. 60 tablet 0   No facility-administered medications prior to visit.    ROS Review of Systems  Constitutional: Negative for chills, activity change, appetite change, fatigue and unexpected weight change.  HENT: Negative for congestion, mouth sores and sinus pressure.   Eyes: Negative for visual disturbance.  Respiratory: Negative for cough and chest tightness.   Cardiovascular: Positive for leg swelling.  Gastrointestinal: Negative for nausea and abdominal pain.  Genitourinary: Negative for frequency, difficulty urinating and vaginal pain.  Musculoskeletal: Positive for gait problem. Negative for back pain.  Skin: Negative for pallor and rash.  Neurological: Negative for dizziness, tremors, weakness, numbness and headaches.  Psychiatric/Behavioral: Negative for suicidal ideas, confusion and sleep disturbance.    Objective:  BP 108/60 mmHg  Pulse 99  Wt 131 lb (59.421 kg)  SpO2 91%  BP Readings from Last 3 Encounters:  12/24/15 108/60  12/19/15 95/60  12/14/15 86/58    Wt Readings from Last 3 Encounters:  12/24/15 131 lb (59.421 kg)  12/19/15 134 lb 1.6 oz (60.827 kg)  12/14/15 153 lb (69.4 kg)    Physical Exam  Constitutional: She appears well-developed. No distress.  HENT:  Head:  Normocephalic.  Right Ear: External ear normal.  Left Ear: External ear normal.  Nose: Nose normal.  Mouth/Throat: Oropharynx is clear and moist.  Eyes: Conjunctivae are normal. Pupils are equal, round, and reactive to light. Right eye exhibits no discharge. Left eye exhibits no discharge.  Neck: Normal range of motion. Neck supple. No JVD present. No tracheal deviation present. No thyromegaly present.    Cardiovascular: Normal rate and regular rhythm.   Murmur heard. Pulmonary/Chest: No stridor. No respiratory distress. She has no wheezes.  Abdominal: Soft. Bowel sounds are normal. She exhibits no distension and no mass. There is no tenderness. There is no rebound and no guarding.  Musculoskeletal: She exhibits edema and tenderness.  Lymphadenopathy:    She has no cervical adenopathy.  Neurological: She displays normal reflexes. No cranial nerve deficit. She exhibits normal muscle tone. Coordination normal.  Skin: No rash noted. No erythema.  Psychiatric: She has a normal mood and affect. Her behavior is normal. Judgment and thought content normal.  Trace edema B Wounds are dressed Walker  ACE on LLE Lab Results  Component Value Date   WBC 7.4 12/19/2015   HGB 10.6* 12/19/2015   HCT 33.9* 12/19/2015   PLT 196 12/19/2015   GLUCOSE 94 12/19/2015   CHOL 130 11/04/2015   TRIG 77.0 11/04/2015   HDL 54.50 11/04/2015   LDLCALC 60 11/04/2015   ALT 39 12/18/2015   AST 39 12/18/2015   NA 140 12/19/2015   K 4.0 12/19/2015   CL 94* 12/19/2015   CREATININE 1.67* 12/19/2015   BUN 40* 12/19/2015   CO2 33* 12/19/2015   TSH 2.773 12/14/2015   INR 0.95 10/25/2011   HGBA1C 5.8 02/06/2014   MICROALBUR 0.6 03/23/2011    No results found.  Assessment & Plan:   There are no diagnoses linked to this encounter. I am having Mia Contreras maintain her Calcium Carbonate-Vitamin D, Multiple Vitamins-Minerals (MULTIVITAMIN PO), Fluticasone-Salmeterol, ALPRAZolam, carvedilol, amiodarone, apixaban, Potassium Chloride ER, spironolactone, and torsemide.  No orders of the defined types were placed in this encounter.     Follow-up: No Follow-up on file.  Walker Kehr, MD

## 2015-12-27 DIAGNOSIS — I480 Paroxysmal atrial fibrillation: Secondary | ICD-10-CM | POA: Diagnosis not present

## 2015-12-27 DIAGNOSIS — I5043 Acute on chronic combined systolic (congestive) and diastolic (congestive) heart failure: Secondary | ICD-10-CM | POA: Diagnosis not present

## 2015-12-27 DIAGNOSIS — I130 Hypertensive heart and chronic kidney disease with heart failure and stage 1 through stage 4 chronic kidney disease, or unspecified chronic kidney disease: Secondary | ICD-10-CM | POA: Diagnosis not present

## 2015-12-27 DIAGNOSIS — J449 Chronic obstructive pulmonary disease, unspecified: Secondary | ICD-10-CM | POA: Diagnosis not present

## 2015-12-27 DIAGNOSIS — I872 Venous insufficiency (chronic) (peripheral): Secondary | ICD-10-CM | POA: Diagnosis not present

## 2015-12-27 DIAGNOSIS — N182 Chronic kidney disease, stage 2 (mild): Secondary | ICD-10-CM | POA: Diagnosis not present

## 2015-12-28 ENCOUNTER — Ambulatory Visit (HOSPITAL_COMMUNITY)
Admission: RE | Admit: 2015-12-28 | Discharge: 2015-12-28 | Disposition: A | Discharge status: Home / Self Care | Payer: Medicare Other | Source: Ambulatory Visit | Attending: Cardiology | Admitting: Cardiology

## 2015-12-28 ENCOUNTER — Telehealth (HOSPITAL_COMMUNITY): Payer: Self-pay | Admitting: Cardiology

## 2015-12-28 VITALS — BP 96/62 | HR 98 | Wt 130.6 lb

## 2015-12-28 DIAGNOSIS — Z9221 Personal history of antineoplastic chemotherapy: Secondary | ICD-10-CM | POA: Insufficient documentation

## 2015-12-28 DIAGNOSIS — Z853 Personal history of malignant neoplasm of breast: Secondary | ICD-10-CM | POA: Insufficient documentation

## 2015-12-28 DIAGNOSIS — Z7901 Long term (current) use of anticoagulants: Secondary | ICD-10-CM | POA: Diagnosis not present

## 2015-12-28 DIAGNOSIS — Z85828 Personal history of other malignant neoplasm of skin: Secondary | ICD-10-CM | POA: Diagnosis not present

## 2015-12-28 DIAGNOSIS — E785 Hyperlipidemia, unspecified: Secondary | ICD-10-CM | POA: Insufficient documentation

## 2015-12-28 DIAGNOSIS — I5031 Acute diastolic (congestive) heart failure: Secondary | ICD-10-CM

## 2015-12-28 DIAGNOSIS — I130 Hypertensive heart and chronic kidney disease with heart failure and stage 1 through stage 4 chronic kidney disease, or unspecified chronic kidney disease: Secondary | ICD-10-CM | POA: Diagnosis not present

## 2015-12-28 DIAGNOSIS — Z8249 Family history of ischemic heart disease and other diseases of the circulatory system: Secondary | ICD-10-CM | POA: Insufficient documentation

## 2015-12-28 DIAGNOSIS — I272 Other secondary pulmonary hypertension: Secondary | ICD-10-CM | POA: Insufficient documentation

## 2015-12-28 DIAGNOSIS — J449 Chronic obstructive pulmonary disease, unspecified: Secondary | ICD-10-CM | POA: Diagnosis not present

## 2015-12-28 DIAGNOSIS — Z888 Allergy status to other drugs, medicaments and biological substances status: Secondary | ICD-10-CM | POA: Insufficient documentation

## 2015-12-28 DIAGNOSIS — N179 Acute kidney failure, unspecified: Secondary | ICD-10-CM

## 2015-12-28 DIAGNOSIS — Z79899 Other long term (current) drug therapy: Secondary | ICD-10-CM | POA: Diagnosis not present

## 2015-12-28 DIAGNOSIS — Z881 Allergy status to other antibiotic agents status: Secondary | ICD-10-CM | POA: Insufficient documentation

## 2015-12-28 DIAGNOSIS — I5022 Chronic systolic (congestive) heart failure: Secondary | ICD-10-CM | POA: Diagnosis not present

## 2015-12-28 DIAGNOSIS — I509 Heart failure, unspecified: Secondary | ICD-10-CM

## 2015-12-28 DIAGNOSIS — I872 Venous insufficiency (chronic) (peripheral): Secondary | ICD-10-CM | POA: Insufficient documentation

## 2015-12-28 DIAGNOSIS — N183 Chronic kidney disease, stage 3 (moderate): Secondary | ICD-10-CM | POA: Insufficient documentation

## 2015-12-28 DIAGNOSIS — I428 Other cardiomyopathies: Secondary | ICD-10-CM | POA: Insufficient documentation

## 2015-12-28 DIAGNOSIS — Z87891 Personal history of nicotine dependence: Secondary | ICD-10-CM | POA: Diagnosis not present

## 2015-12-28 DIAGNOSIS — S81802D Unspecified open wound, left lower leg, subsequent encounter: Secondary | ICD-10-CM

## 2015-12-28 DIAGNOSIS — K589 Irritable bowel syndrome without diarrhea: Secondary | ICD-10-CM | POA: Insufficient documentation

## 2015-12-28 LAB — BASIC METABOLIC PANEL
ANION GAP: 11 (ref 5–15)
BUN: 51 mg/dL — ABNORMAL HIGH (ref 6–20)
CHLORIDE: 99 mmol/L — AB (ref 101–111)
CO2: 27 mmol/L (ref 22–32)
Calcium: 9.6 mg/dL (ref 8.9–10.3)
Creatinine, Ser: 2.26 mg/dL — ABNORMAL HIGH (ref 0.44–1.00)
GFR calc non Af Amer: 19 mL/min — ABNORMAL LOW (ref >60)
GFR, EST AFRICAN AMERICAN: 22 mL/min — AB (ref >60)
GLUCOSE: 100 mg/dL — AB (ref 65–99)
POTASSIUM: 5.4 mmol/L — AB (ref 3.5–5.1)
Sodium: 137 mmol/L (ref 135–145)

## 2015-12-28 MED ORDER — TORSEMIDE 20 MG PO TABS: 20.0000 mg | ORAL_TABLET | Freq: Every day | ORAL | Status: DC

## 2015-12-28 MED ORDER — APIXABAN 2.5 MG PO TABS: 2.5000 mg | ORAL_TABLET | Freq: Two times a day (BID) | ORAL | Status: DC

## 2015-12-28 NOTE — Progress Notes (Signed)
Patient ID: Mia Contreras, female   DOB: [birth year], 82 y.o.   MRN: [id number]    Advanced Heart Failure Clinic Note   Referring Physician: Dr Johnsie Cancel Primary Care: Arlana Lindau, MD Primary Cardiologist: Dr Johnsie Cancel Primary HF: Dr. Haroldine Laws  HPI:   Mia Contreras is a 82 y.o. female with a history of nonischemic cardiomyopathy, systolic CHF, PACs, L Breast cancer in 2000 with surgery and chemo.  Echo 12/16/14  Normal LV size with EF 30-35%, diffuse hypokinesis. Normal RVsize with mildly decreased systolic function. Moderate to severeLAE. Mild MR. Moderate pulmonary hypertension.  She has never had a cath. Had 4 rounds of chemo for breast CA. Doesn't remember which agents she got. Does not recall a red medicine.   Admitted 4/25 -12/19/15 with A/C combined CHF and AKI. Creatinine improved.  She diuresed 12 L and down 19 lbs from highest weight that admission. Discharge weight 134 lbs.   She presents today for post hospital follow up. Down 3 lbs from admission. Started PT yesterday and her legs are sore, both otherwise doing well. Is at home.  Really wants to start driving again. Has been watching salt and fluid. Lightheadedness and dizziness has improved. No CP or orthopnea.  DOE much improved.  Weight at home 128 (down from previous perceived baseline of 138 lbs).  Recent labs:  12/01/15: Sodium 140 K 5.0 creatinine 2.5 (was 1.3 in 3/17). Albumin 3.8 BNP 1090 12/19/15: K 4.0, Creatinine 1.67  FH: Brother also has CAD and afib, Mother had CABG but did well, Father passed away from MI at age 78 thought to be 2/2 to heavy ETOH use.  SH:  Pt has a distant history of smoking. Pt has 50-60 pack years up to 2ppd. Quit in 1982.   Past Medical History  Diagnosis Date  . GOITER, MULTINODULAR 05/06/2010    Dr [name]  . HYPERTHYROIDISM 05/23/2010  . HYPERLIPIDEMIA 08/20/2007  . ANEMIA-NOS 08/20/2007  . ANXIETY 03/16/2007  . CARPAL TUNNEL SYNDROME, RIGHT 01/11/2008  . HYPERTENSION 03/16/2007    . VENOUS INSUFFICIENCY 09/05/2007  . DIVERTICULOSIS, COLON 08/20/2007  . CELLULITIS, LEG, RIGHT 08/20/2007  . OSTEOARTHRITIS 03/16/2007  . OSTEOPOROSIS 08/20/2007  . SCOLIOSIS 01/11/2008  . BREAST CANCER, HX OF 03/16/2007  . SKIN CANCER, HX OF 11/12/2008     R cheek 2010, L Cheek 2011 Dr. Tonia Brooms  . PREMATURE ATRIAL CONTRACTIONS 09/21/2010  . Thyroid nodule   . Cataract     floaters  . Pneumonia 07/2011    took abx for several weeks  . IBS (irritable bowel syndrome)   . Collagenous colitis     Current Outpatient Prescriptions  Medication Sig Dispense Refill  . ALPRAZolam (XANAX) 0.5 MG tablet Take 1 tablet (0.5 mg total) by mouth 2 (two) times daily as needed for anxiety or sleep. 60 tablet 3  . amiodarone (PACERONE) 200 MG tablet Take 1 tablet (200 mg total) by mouth 2 (two) times daily. 60 tablet 0  . apixaban (ELIQUIS) 2.5 MG TABS tablet Take 1 tablet (2.5 mg total) by mouth 2 (two) times daily. 60 tablet 5  . Calcium Carbonate-Vitamin D (CALCIUM 600+D) 600-400 MG-UNIT per tablet Take 1 tablet by mouth daily.    . carvedilol (COREG) 12.5 MG tablet Take 1 tablet (12.5 mg total) by mouth 2 (two) times daily with a meal. 180 tablet 3  . Fluticasone-Salmeterol (ADVAIR DISKUS) 250-50 MCG/DOSE AEPB Inhale 1 puff into the lungs 2 (two) times daily. 60 each 3  . Multiple Vitamins-Minerals (MULTIVITAMIN  PO) Take 1 tablet by mouth daily.     . potassium chloride 20 MEQ TBCR Take 20 mEq by mouth 2 (two) times daily. 30 tablet 0  . spironolactone (ALDACTONE) 25 MG tablet Take 0.5 tablets (12.5 mg total) by mouth daily. 30 tablet 0  . torsemide (DEMADEX) 20 MG tablet Take 1 tablet (20 mg total) by mouth 2 (two) times daily. 60 tablet 0   No current facility-administered medications for this encounter.    Allergies  Allergen Reactions  . Cefuroxime Diarrhea  . Celecoxib Nausea Only  . Deltasone [Prednisone]     Oral deltasone is causing swelling (can use Depo-Medrol)  . Ranitidine      bloating  . Tramadol Hcl Nausea Only      Social History   Social History  . Marital Status: Single    Spouse Name: N/A  . Number of Children: N/A  . Years of Education: N/A   Occupational History  . Not on file.   Social History Main Topics  . Smoking status: Former Smoker    Quit date: 12/20/1980  . Smokeless tobacco: Never Used  . Alcohol Use: Yes     Comment: socially  . Drug Use: No  . Sexual Activity: Not Currently   Other Topics Concern  . Not on file   Social History Narrative      Family History  Problem Relation Age of Onset  . Dementia Mother   . Heart disease Mother   . Mental retardation Mother   . Colon cancer Neg Hx   . Hypertension Mother   . Alzheimer's disease Mother   . Prostate cancer Brother   . Diabetes Other   . Heart attack Father   . Alcohol abuse Father   . Anesthesia problems Neg Hx     Filed Vitals:   12/28/15 1028  BP: 96/62  Pulse: 98  Weight: 130 lb 9.6 oz (59.24 kg)  SpO2: 91%   Wt Readings from Last 3 Encounters:  12/28/15 130 lb 9.6 oz (59.24 kg)  12/24/15 131 lb (59.421 kg)  12/19/15 134 lb 1.6 oz (60.827 kg)    PHYSICAL EXAM: General:  Elderly. Sitting in [place]. NAD HEENT: normal Neck: supple. JVP flat. Carotids 2+ bilat; no bruits. No thyromegaly or nodule noted.  Cor: PMI nondisplaced. Irregular rate & rhythm. No rubs, gallops or murmurs. Lungs: Clear Abdomen: soft, NT, ND, no HSM. No bruits or masses. +BS  Extremities: no cyanosis, clubbing, rash. Left leg wrapped with gauze due to wound, trace edema at top of wrap.  Neuro: alert & oriented x 3, cranial nerves grossly intact. moves all 4 extremities w/o difficulty. Affect pleasant.  ECG (4/16): NSR with inferior and anterior q waves  ASSESSMENT & PLAN: 1. Chronic systolic HF with R>>L symptoms 2. Acute on chronic renal failure, stage 3 3. LE wounds 4.  H/o COPD 5. H/o breast cancer s/p chemo 2000  Her volume looks good, possibly towards dryer side with  continued weight loss.  Continue PT.   Leg wounds per PCP.   BMET today.   Follow up 4 weeks.   No med changes for now with soft BP.  Shirley Friar, PA-C 12/28/2015

## 2015-12-28 NOTE — Addendum Note (Signed)
Encounter addended by: Kerry Dory, CMA on: 12/28/2015  4:00 PM<BR>     Documentation filed: Orders

## 2015-12-28 NOTE — Telephone Encounter (Signed)
-----   Message from Shirley Friar, PA-C sent at 12/28/2015 11:59 AM EDT ----- K and Creatinine up.   Stop spiro and K.    Hold torsemide tonight, and tomorrow.  Resume torsemide at 20 mg daily on Thursday morning.  Needs recheck BMET Friday, will address K and Arlyce Harman based on results.   Please move follow up to see me next week. (Wednesday)  Legrand Como "[employer], PA-C 12/28/2015 11:56 AM

## 2015-12-28 NOTE — Patient Instructions (Signed)
Labs today  Your physician recommends that you schedule a follow-up appointment in: 4 weeks In the [facility]   Be sure to take and additional 20 mg of Torsemide as needed for 3 lb weight gain over night or 5 lbs in one week  Do the following things EVERYDAY: 1) Weigh yourself in the morning before breakfast. Write it down and keep it in a log. 2) Take your medicines as prescribed 3) Eat low salt foods-Limit salt (sodium) to 2000 mg per day.  4) Stay as active as you can everyday 5) Limit all fluids for the day to less than 2 liters 6)

## 2015-12-29 ENCOUNTER — Other Ambulatory Visit: Payer: Self-pay

## 2015-12-29 DIAGNOSIS — I130 Hypertensive heart and chronic kidney disease with heart failure and stage 1 through stage 4 chronic kidney disease, or unspecified chronic kidney disease: Secondary | ICD-10-CM | POA: Diagnosis not present

## 2015-12-29 DIAGNOSIS — I5043 Acute on chronic combined systolic (congestive) and diastolic (congestive) heart failure: Secondary | ICD-10-CM | POA: Diagnosis not present

## 2015-12-29 DIAGNOSIS — N182 Chronic kidney disease, stage 2 (mild): Secondary | ICD-10-CM | POA: Diagnosis not present

## 2015-12-29 DIAGNOSIS — J449 Chronic obstructive pulmonary disease, unspecified: Secondary | ICD-10-CM | POA: Diagnosis not present

## 2015-12-29 DIAGNOSIS — I480 Paroxysmal atrial fibrillation: Secondary | ICD-10-CM | POA: Diagnosis not present

## 2015-12-29 DIAGNOSIS — I872 Venous insufficiency (chronic) (peripheral): Secondary | ICD-10-CM | POA: Diagnosis not present

## 2015-12-29 NOTE — Patient Outreach (Signed)
[facility]) Care Management  12/29/2015  Frannie Avery Gersten 08-21-1929 [id number]  RNCM returned call to member who stated she had an appointment for labwork on tomorrow and request to change home visit time. When RNCM called, member stated she had her dates/time mixed up and that RNCM can complete home visit as scheduled. Member reports advanced home health nurse is making a visit at this time.   Plan: continue weekly engagements.  Thea Silversmith, RN, MSN, [place] Coordinator Cell: [phone]

## 2015-12-30 ENCOUNTER — Other Ambulatory Visit: Payer: Self-pay

## 2015-12-30 NOTE — Patient Outreach (Signed)
[place] [facility]) Care Management  [place]  12/30/2015   Brenli Rawdon Bauserman [birth year] [id number]  Subjective: member reports has been to her follow up appointment with cardiologist and at the heart failure clinic. Member reports she has been weighing and writing weights down.  Objective: BP 98/60 mmHg  Pulse 95  Resp 20  Ht 1.6 m (5\' 3" )  Wt 128 lb 6.4 oz (58.242 kg)  BMI 22.75 kg/m2  SpO2 92%, lungs clear, heart rate irregular.   Encounter Medications:  Outpatient Encounter Prescriptions as of 12/30/2015  Medication Sig  . ALPRAZolam (XANAX) 0.5 MG tablet Take 1 tablet (0.5 mg total) by mouth 2 (two) times daily as needed for anxiety or sleep.  Marland Kitchen amiodarone (PACERONE) 200 MG tablet Take 1 tablet (200 mg total) by mouth 2 (two) times daily.  Marland Kitchen apixaban (ELIQUIS) 2.5 MG TABS tablet Take 1 tablet (2.5 mg total) by mouth 2 (two) times daily.  . Calcium Carbonate-Vitamin D (CALCIUM 600+D) 600-400 MG-UNIT per tablet Take 1 tablet by mouth daily.  . carvedilol (COREG) 12.5 MG tablet Take 1 tablet (12.5 mg total) by mouth 2 (two) times daily with a meal.  . Fluticasone-Salmeterol (ADVAIR DISKUS) 250-50 MCG/DOSE AEPB Inhale 1 puff into the lungs 2 (two) times daily.  . Multiple Vitamins-Minerals (MULTIVITAMIN PO) Take 1 tablet by mouth daily.   Marland Kitchen torsemide (DEMADEX) 20 MG tablet Take 1 tablet (20 mg total) by mouth daily.   No facility-administered encounter medications on file as of 12/30/2015.    Functional Status:  In your present state of health, do you have any difficulty performing the following activities: 12/30/2015 12/18/2015  Hearing? N N  Vision? N N  Difficulty concentrating or making decisions? N N  Walking or climbing stairs? Y Y  Dressing or bathing? N N  Doing errands, shopping? N N  Preparing Food and eating ? N -  Using the Toilet? N -  In the past six months, have you accidently leaked urine? N -  Do you have problems with loss of bowel control?  N -  Managing your Medications? N -  Managing your Finances? N -  Housekeeping or managing your Housekeeping? N -    Fall/Depression Screening: PHQ 2/9 Scores 12/30/2015 12/24/2015 11/11/2014 11/11/2014  PHQ - 2 Score 0 0 0 0    Assessment: 82 year old with recent admission for heart failure. Member lives alone and has supportive friends, church member. She does not have family in the area. Member reports she is feeling better. She is weighing herself and taking her medications as prescribed. Denies any shortness of breath or increased edema.  Medications-member reports concern over the high cost of medication eliquis. Pharmacist consulted.  Plan: RNCM will follow up with [facility] pharmacist. Continue weekly engagement for transition of care. THN CM Care Plan Problem One        Most Recent Value   Care Plan Problem One  at risk for readmission   Role Documenting the Problem One  Care Management [place] for Problem One  Active   THN Long Term Goal (31-90 days)  member will not be readmitted within the next 31 days.   THN Long Term Goal Start Date  12/20/15   Interventions for Problem One Long Term Goal  home visit, reviewed medications, discussed heart failure zone tool   THN CM Short Term Goal #1 (0-30 days)  member will attend follow up appointments as scheduled within the next 30  days.   THN CM Short Term Goal #1 Start Date  12/20/15   Interventions for Short Term Goal #1  discussed upcoming appointments.   THN CM Short Term Goal #2 (0-30 days)  member will verbalize taking medications as scheduled within the next 30 days.   THN CM Short Term Goal #2 Start Date  12/20/15   Interventions for Short Term Goal #2  medications reviewed.      Thea Silversmith, RN, MSN, [place] Coordinator Cell: [phone]

## 2015-12-31 ENCOUNTER — Ambulatory Visit (HOSPITAL_COMMUNITY)
Admission: RE | Admit: 2015-12-31 | Discharge: 2015-12-31 | Disposition: A | Discharge status: Home / Self Care | Payer: Medicare Other | Source: Ambulatory Visit | Attending: Internal Medicine | Admitting: Internal Medicine

## 2015-12-31 DIAGNOSIS — I509 Heart failure, unspecified: Secondary | ICD-10-CM | POA: Diagnosis not present

## 2015-12-31 DIAGNOSIS — L97821 Non-pressure chronic ulcer of other part of left lower leg limited to breakdown of skin: Secondary | ICD-10-CM | POA: Diagnosis not present

## 2015-12-31 DIAGNOSIS — I87332 Chronic venous hypertension (idiopathic) with ulcer and inflammation of left lower extremity: Secondary | ICD-10-CM | POA: Diagnosis not present

## 2015-12-31 DIAGNOSIS — I5022 Chronic systolic (congestive) heart failure: Secondary | ICD-10-CM | POA: Insufficient documentation

## 2015-12-31 DIAGNOSIS — L97811 Non-pressure chronic ulcer of other part of right lower leg limited to breakdown of skin: Secondary | ICD-10-CM | POA: Diagnosis not present

## 2015-12-31 DIAGNOSIS — J449 Chronic obstructive pulmonary disease, unspecified: Secondary | ICD-10-CM | POA: Diagnosis not present

## 2015-12-31 DIAGNOSIS — I872 Venous insufficiency (chronic) (peripheral): Secondary | ICD-10-CM | POA: Diagnosis not present

## 2015-12-31 DIAGNOSIS — M1990 Unspecified osteoarthritis, unspecified site: Secondary | ICD-10-CM | POA: Diagnosis not present

## 2015-12-31 LAB — BASIC METABOLIC PANEL
Anion gap: 13 (ref 5–15)
BUN: 55 mg/dL — AB (ref 6–20)
CHLORIDE: 99 mmol/L — AB (ref 101–111)
CO2: 26 mmol/L (ref 22–32)
Calcium: 9.8 mg/dL (ref 8.9–10.3)
Creatinine, Ser: 2.49 mg/dL — ABNORMAL HIGH (ref 0.44–1.00)
GFR calc non Af Amer: 17 mL/min — ABNORMAL LOW (ref >60)
GFR, EST AFRICAN AMERICAN: 19 mL/min — AB (ref >60)
Glucose, Bld: 139 mg/dL — ABNORMAL HIGH (ref 65–99)
POTASSIUM: 4.8 mmol/L (ref 3.5–5.1)
SODIUM: 138 mmol/L (ref 135–145)

## 2016-01-03 ENCOUNTER — Other Ambulatory Visit: Payer: Medicare Other | Admitting: *Deleted

## 2016-01-03 ENCOUNTER — Ambulatory Visit (INDEPENDENT_AMBULATORY_CARE_PROVIDER_SITE_OTHER): Payer: Medicare Other | Admitting: Pharmacist

## 2016-01-03 NOTE — Addendum Note (Signed)
Addended by: Eulis Foster on: 09/15/2015 01:26 PM   Modules accepted: Orders

## 2016-01-04 ENCOUNTER — Other Ambulatory Visit: Payer: Self-pay | Admitting: Pharmacist

## 2016-01-04 DIAGNOSIS — I480 Paroxysmal atrial fibrillation: Secondary | ICD-10-CM | POA: Diagnosis not present

## 2016-01-04 DIAGNOSIS — I130 Hypertensive heart and chronic kidney disease with heart failure and stage 1 through stage 4 chronic kidney disease, or unspecified chronic kidney disease: Secondary | ICD-10-CM | POA: Diagnosis not present

## 2016-01-04 DIAGNOSIS — J449 Chronic obstructive pulmonary disease, unspecified: Secondary | ICD-10-CM | POA: Diagnosis not present

## 2016-01-04 DIAGNOSIS — I5043 Acute on chronic combined systolic (congestive) and diastolic (congestive) heart failure: Secondary | ICD-10-CM | POA: Diagnosis not present

## 2016-01-04 DIAGNOSIS — N182 Chronic kidney disease, stage 2 (mild): Secondary | ICD-10-CM | POA: Diagnosis not present

## 2016-01-04 DIAGNOSIS — I872 Venous insufficiency (chronic) (peripheral): Secondary | ICD-10-CM | POA: Diagnosis not present

## 2016-01-04 NOTE — Progress Notes (Signed)
Patient ID: Mia Contreras, female   DOB: 1929/08/16, 82 y.o.   MRN: [id number]    Advanced Heart Failure Clinic Note   Referring Physician: Dr Johnsie Cancel Primary Care: Arlana Lindau, MD Primary Cardiologist: Dr Johnsie Cancel Primary HF: Dr. Haroldine Laws  HPI:   Mia Contreras is a 82 y.o. female with a history of nonischemic cardiomyopathy, systolic CHF, PACs, L Breast cancer in 2000 with surgery and chemo.  Echo 12/16/14  Normal LV size with EF 30-35%, diffuse hypokinesis. Normal RVsize with mildly decreased systolic function. Moderate to severeLAE. Mild MR. Moderate pulmonary hypertension.  She has never had a cath. Had 4 rounds of chemo for breast CA. Doesn't remember which agents she got. Does not recall a red medicine.   Admitted 4/25 -12/19/15 with A/C combined CHF and AKI. Creatinine improved.  She diuresed 12 L and down 19 lbs from highest weight that admission. Discharge weight 134 lbs.   She presents today for add on with recent decreases in torsemide due to creatinine up trend. Had her hold over weekend then resume at every other day dosing. Weight at home 128.4 yesterday.  Weight in clinic up 3 lbs from last visit. Feels ok overall. Energy level "not there yet" but very slowly improving. Working with PT.   Has noticed R foot swelling. Tries keeping propped up, hasn't used compression stockings.  Denies lightheadedness or dizziness. Drove this morning and did fine.  No CP or orthopnea.   Recent labs:  12/01/15: Sodium 140 K 5.0 creatinine 2.5 (was 1.3 in 3/17). Albumin 3.8 BNP 1090 12/19/15: K 4.0, Creatinine 1.67 12/28/15: K 5.4, Creatinine 2.26 12/31/15 K 4.8, creatinine 2.49  FH: Brother also has CAD and afib, Mother had CABG but did well, Father passed away from MI at age 65 thought to be 2/2 to heavy ETOH use.  SH:  Pt has a distant history of smoking. Pt has 50-60 pack years up to 2ppd. Quit in 1982.   Past Medical History  Diagnosis Date  . GOITER, MULTINODULAR 05/06/2010   Dr [name]  . HYPERTHYROIDISM 05/23/2010  . HYPERLIPIDEMIA 08/20/2007  . ANEMIA-NOS 08/20/2007  . ANXIETY 03/16/2007  . CARPAL TUNNEL SYNDROME, RIGHT 01/11/2008  . HYPERTENSION 03/16/2007  . VENOUS INSUFFICIENCY 09/05/2007  . DIVERTICULOSIS, COLON 08/20/2007  . CELLULITIS, LEG, RIGHT 08/20/2007  . OSTEOARTHRITIS 03/16/2007  . OSTEOPOROSIS 08/20/2007  . SCOLIOSIS 01/11/2008  . BREAST CANCER, HX OF 03/16/2007  . SKIN CANCER, HX OF 11/12/2008     R cheek 2010, L Cheek 2011 Dr. Tonia Brooms  . PREMATURE ATRIAL CONTRACTIONS 09/21/2010  . Thyroid nodule   . Cataract     floaters  . Pneumonia 07/2011    took abx for several weeks  . IBS (irritable bowel syndrome)   . Collagenous colitis     Current Outpatient Prescriptions  Medication Sig Dispense Refill  . ALPRAZolam (XANAX) 0.5 MG tablet Take 1 tablet (0.5 mg total) by mouth 2 (two) times daily as needed for anxiety or sleep. 60 tablet 3  . amiodarone (PACERONE) 200 MG tablet Take 1 tablet (200 mg total) by mouth 2 (two) times daily. 60 tablet 0  . apixaban (ELIQUIS) 2.5 MG TABS tablet Take 1 tablet (2.5 mg total) by mouth 2 (two) times daily. 180 tablet 3  . Calcium Carbonate-Vitamin D (CALCIUM 600+D) 600-400 MG-UNIT per tablet Take 1 tablet by mouth daily.    . carvedilol (COREG) 12.5 MG tablet Take 1 tablet (12.5 mg total) by mouth 2 (two) times daily with a  meal. 180 tablet 3  . Fluticasone-Salmeterol (ADVAIR DISKUS) 250-50 MCG/DOSE AEPB Inhale 1 puff into the lungs 2 (two) times daily. 60 each 3  . Multiple Vitamins-Minerals (MULTIVITAMIN PO) Take 1 tablet by mouth daily.     Marland Kitchen torsemide (DEMADEX) 20 MG tablet Take 20 mg by mouth every other day.     No current facility-administered medications for this encounter.    Allergies  Allergen Reactions  . Cefuroxime Diarrhea  . Celecoxib Nausea Only  . Deltasone [Prednisone]     Oral deltasone is causing swelling (can use Depo-Medrol)  . Ranitidine     bloating  . Tramadol Hcl Nausea Only        Social History   Social History  . Marital Status: Single    Spouse Name: N/A  . Number of Children: N/A  . Years of Education: N/A   Occupational History  . Not on file.   Social History Main Topics  . Smoking status: Former Smoker    Quit date: 12/20/1980  . Smokeless tobacco: Never Used  . Alcohol Use: Yes     Comment: socially  . Drug Use: No  . Sexual Activity: Not Currently   Other Topics Concern  . Not on file   Social History Narrative      Family History  Problem Relation Age of Onset  . Dementia Mother   . Heart disease Mother   . Mental retardation Mother   . Colon cancer Neg Hx   . Hypertension Mother   . Alzheimer's disease Mother   . Prostate cancer Brother   . Diabetes Other   . Heart attack Father   . Alcohol abuse Father   . Anesthesia problems Neg Hx     Filed Vitals:   01/05/16 1207  BP: 96/72  Pulse: 88  Weight: 131 lb 3.2 oz (59.512 kg)  SpO2: 90%   Wt Readings from Last 3 Encounters:  01/05/16 131 lb 3.2 oz (59.512 kg)  12/30/15 128 lb 6.4 oz (58.242 kg)  12/28/15 130 lb 9.6 oz (59.24 kg)    PHYSICAL EXAM: General:  Elderly. Sitting in [place]. NAD HEENT: normal Neck: supple. JVP 6-7. Carotids 2+ bilat; no bruits. No thyromegaly or nodule noted.  Cor: PMI nondisplaced. Irregular rate & rhythm. No rubs, gallops or murmurs. Lungs: CTAB, normal effort.  Abdomen: soft, NT, ND, no HSM. No bruits or masses. +BS  Extremities: no cyanosis, clubbing, rash. Left leg wrapped with gauze due to wound, trace edema at top of wrap. R ankle 1+ edema.  Neuro: alert & oriented x 3, cranial nerves grossly intact. moves all 4 extremities w/o difficulty. Affect pleasant.  ASSESSMENT & PLAN: 1. Chronic systolic HF with R>>L symptoms 2. Acute on chronic renal failure, stage 3 3. LE wounds 4.  H/o COPD 5. H/o breast cancer s/p chemo 2000  Her volume stats looks stable, although she is having some R ankle swelling.  Will provide script for  compression hose.    BMET today. Pt initially waited for results but lab could not find tube, so BMET had to be re-sent. Continue torsemide every other day with uptrend in creatinine.  Will need to follow up closely. Will see in 2 weeks, with repeat BMET next week per [facility].   Shirley Friar, PA-C 01/05/2016

## 2016-01-04 NOTE — Patient Outreach (Signed)
[facility]) Care Management  01/04/2016  Mia Contreras [birth year] [id number]  Tullos received referral for patient from [place], [place] Coordinator regarding patient's cost of Eliquis.    Per review of patient's chart, there was a note from 01/04/16, from [place], PharmD in coumadin clinic that PA was completed for Eliquis and cost was now $45/month supply, note indicated patient was counseled on this already as well.   [facility] Pharmacist placed call to patient who verified her name and date of birth.  Explained purpose of call, and verified with patient that she no longer has questions with Eliquis affordability at this time as [name], PharmD in coumadin clinic took care of it.   Patient denies other pharmacy needs or medication questions at this time.    Plan:  Will close pharmacy case at this time and update Denton Brick, [facility] RN [facility] of this.  Patient has [facility] Pharmacist phone number should she need anything further.    Karrie Meres, PharmD, [place] [phone]

## 2016-01-04 NOTE — Progress Notes (Signed)
Pt was started on Eliquis for Afib on December 15, 2015.  She was referred to Anticoagulation Clinic by Dr. Alain Marion due to cost of Eliquis.   Reviewed patients medication list.  Pt is not currently on any combined P-gp and strong CYP3A4 inhibitors/inducers (ketoconazole, traconazole, ritonavir, carbamazepine, phenytoin, rifampin, St. John's wort).  Reviewed labs.  SCr 2.49, Weight 59 kg, CrCl- 15 mL/min.  Note latest SCr higher than her baseline.  Her diuretics have been adjusted to help improve this.  If this is her new baseline, may need to consider switching to Coumadin given CrCl < 25 mL/min, which is lowest renal function studied in outcomes trials.  Dose appropriate based on criteria.   Hgb and HCT Within Normal Limits  A full discussion of the nature of anticoagulants has been carried out.  A benefit/risk analysis has been presented to the patient, so that they understand the justification for choosing anticoagulation with Eliquis at this time.  The need for compliance is stressed.  Pt is aware to take the medication twice daily.  Side effects of potential bleeding are discussed, including unusual colored urine or stools, coughing up blood or coffee ground emesis, nose bleeds or serious fall or head trauma.  Discussed signs and symptoms of stroke. The patient should avoid any OTC items containing aspirin or ibuprofen.  Avoid alcohol consumption.   Call if any signs of abnormal bleeding.    Pt states the cost of Eliquis was $400.  The heart failure clinic had started patient assistance paperwork.  Reviewed her Medicare Part D formulary.  Eliquis was on formulary and had a $45 per month copay.  Called [facility].  Completed PA (reference number [id number]).  Spoke with [facility].  Confirmed copay is only $45 per month.  Pt is aware and will pick up Rx once she is out of samples.  She has a follow up with HF clinic on 5/17 to recheck labs.

## 2016-01-05 ENCOUNTER — Telehealth (HOSPITAL_COMMUNITY): Payer: Self-pay | Admitting: *Deleted

## 2016-01-05 ENCOUNTER — Ambulatory Visit (HOSPITAL_COMMUNITY)
Admission: RE | Admit: 2016-01-05 | Discharge: 2016-01-05 | Disposition: A | Discharge status: Home / Self Care | Payer: Medicare Other | Source: Ambulatory Visit | Attending: Internal Medicine | Admitting: Internal Medicine

## 2016-01-05 ENCOUNTER — Other Ambulatory Visit (HOSPITAL_COMMUNITY): Payer: Self-pay | Admitting: Cardiology

## 2016-01-05 VITALS — BP 96/72 | HR 88 | Wt 131.2 lb

## 2016-01-05 DIAGNOSIS — Z8249 Family history of ischemic heart disease and other diseases of the circulatory system: Secondary | ICD-10-CM | POA: Diagnosis not present

## 2016-01-05 DIAGNOSIS — Z853 Personal history of malignant neoplasm of breast: Secondary | ICD-10-CM | POA: Diagnosis not present

## 2016-01-05 DIAGNOSIS — E785 Hyperlipidemia, unspecified: Secondary | ICD-10-CM | POA: Insufficient documentation

## 2016-01-05 DIAGNOSIS — Z87891 Personal history of nicotine dependence: Secondary | ICD-10-CM | POA: Insufficient documentation

## 2016-01-05 DIAGNOSIS — Z888 Allergy status to other drugs, medicaments and biological substances status: Secondary | ICD-10-CM | POA: Diagnosis not present

## 2016-01-05 DIAGNOSIS — I110 Hypertensive heart disease with heart failure: Secondary | ICD-10-CM | POA: Insufficient documentation

## 2016-01-05 DIAGNOSIS — F419 Anxiety disorder, unspecified: Secondary | ICD-10-CM | POA: Diagnosis not present

## 2016-01-05 DIAGNOSIS — I5022 Chronic systolic (congestive) heart failure: Secondary | ICD-10-CM | POA: Insufficient documentation

## 2016-01-05 DIAGNOSIS — J449 Chronic obstructive pulmonary disease, unspecified: Secondary | ICD-10-CM | POA: Insufficient documentation

## 2016-01-05 DIAGNOSIS — Z881 Allergy status to other antibiotic agents status: Secondary | ICD-10-CM | POA: Diagnosis not present

## 2016-01-05 DIAGNOSIS — Z7901 Long term (current) use of anticoagulants: Secondary | ICD-10-CM | POA: Insufficient documentation

## 2016-01-05 DIAGNOSIS — N179 Acute kidney failure, unspecified: Secondary | ICD-10-CM | POA: Insufficient documentation

## 2016-01-05 DIAGNOSIS — K589 Irritable bowel syndrome without diarrhea: Secondary | ICD-10-CM | POA: Insufficient documentation

## 2016-01-05 DIAGNOSIS — N183 Chronic kidney disease, stage 3 (moderate): Secondary | ICD-10-CM | POA: Insufficient documentation

## 2016-01-05 DIAGNOSIS — Z9221 Personal history of antineoplastic chemotherapy: Secondary | ICD-10-CM | POA: Diagnosis not present

## 2016-01-05 DIAGNOSIS — I5023 Acute on chronic systolic (congestive) heart failure: Secondary | ICD-10-CM | POA: Diagnosis not present

## 2016-01-05 DIAGNOSIS — Z79899 Other long term (current) drug therapy: Secondary | ICD-10-CM | POA: Diagnosis not present

## 2016-01-05 DIAGNOSIS — Z85828 Personal history of other malignant neoplasm of skin: Secondary | ICD-10-CM | POA: Insufficient documentation

## 2016-01-05 DIAGNOSIS — I872 Venous insufficiency (chronic) (peripheral): Secondary | ICD-10-CM | POA: Diagnosis not present

## 2016-01-05 DIAGNOSIS — R7989 Other specified abnormal findings of blood chemistry: Secondary | ICD-10-CM

## 2016-01-05 DIAGNOSIS — S81802D Unspecified open wound, left lower leg, subsequent encounter: Secondary | ICD-10-CM

## 2016-01-05 LAB — BASIC METABOLIC PANEL
ANION GAP: 12 (ref 5–15)
BUN: 66 mg/dL — ABNORMAL HIGH (ref 6–20)
CHLORIDE: 94 mmol/L — AB (ref 101–111)
CO2: 30 mmol/L (ref 22–32)
Calcium: 9.5 mg/dL (ref 8.9–10.3)
Creatinine, Ser: 2.92 mg/dL — ABNORMAL HIGH (ref 0.44–1.00)
GFR calc non Af Amer: 14 mL/min — ABNORMAL LOW (ref >60)
GFR, EST AFRICAN AMERICAN: 16 mL/min — AB (ref >60)
Glucose, Bld: 104 mg/dL — ABNORMAL HIGH (ref 65–99)
POTASSIUM: 5.2 mmol/L — AB (ref 3.5–5.1)
SODIUM: 136 mmol/L (ref 135–145)

## 2016-01-05 NOTE — Progress Notes (Signed)
Advanced Heart Failure Medication Review by a Pharmacist  Does the patient  feel that his/her medications are working for him/her?  yes  Has the patient been experiencing any side effects to the medications prescribed?  no  Does the patient measure his/her own blood pressure or blood glucose at home?  yes   Does the patient have any problems obtaining medications due to transportation or finances?   no  Understanding of regimen: fair Understanding of indications: fair Potential of compliance: good Patient understands to avoid NSAIDs. Patient understands to avoid decongestants.  Issues to address at subsequent visits: None   Pharmacist comments:  Ms. Flory is a pleasant 82 yo F presenting with a current medication list. She reports good compliance with her medications and states that she has not taken spironolactone or KCl since she was told to hold them on 5/9. And restarted torsemide yesterday at new dose of 20 mg QOD. She did not have any specific medication-related questions or concerns for me at this time.   Ruta Hinds. Velva Harman, PharmD, BCPS, CPP Clinical Pharmacist Pager: [phone] Phone: [phone] 01/05/2016 11:48 AM      Time with patient: 10 minutes Preparation and documentation time: 2 minutes Total time: 12 minutes

## 2016-01-05 NOTE — Telephone Encounter (Signed)
Pt called to let us know her pcp had referred her to kidney doctors and she is sch to see Dr Justin Mend on 5/31, she wanted Jonni Sanger to be aware.  Jonni Sanger aware and in agreement with plan

## 2016-01-05 NOTE — Patient Instructions (Signed)
LABS TODAY AGAIN IN ONE WEEK WITH [facility]  NO MEDICATION CHANGES  Your physician recommends that you schedule a follow-up appointment in: AS SCHEDULED

## 2016-01-06 ENCOUNTER — Other Ambulatory Visit: Payer: Self-pay

## 2016-01-06 DIAGNOSIS — I480 Paroxysmal atrial fibrillation: Secondary | ICD-10-CM | POA: Diagnosis not present

## 2016-01-06 DIAGNOSIS — I130 Hypertensive heart and chronic kidney disease with heart failure and stage 1 through stage 4 chronic kidney disease, or unspecified chronic kidney disease: Secondary | ICD-10-CM | POA: Diagnosis not present

## 2016-01-06 DIAGNOSIS — I872 Venous insufficiency (chronic) (peripheral): Secondary | ICD-10-CM | POA: Diagnosis not present

## 2016-01-06 DIAGNOSIS — N182 Chronic kidney disease, stage 2 (mild): Secondary | ICD-10-CM | POA: Diagnosis not present

## 2016-01-06 DIAGNOSIS — I5043 Acute on chronic combined systolic (congestive) and diastolic (congestive) heart failure: Secondary | ICD-10-CM | POA: Diagnosis not present

## 2016-01-06 DIAGNOSIS — J449 Chronic obstructive pulmonary disease, unspecified: Secondary | ICD-10-CM | POA: Diagnosis not present

## 2016-01-06 NOTE — Patient Outreach (Signed)
[facility]) Care Management  01/06/2016  Glayds Rohweder Niederer [birth year] [id number]  RNCM called advanced heart failure clinic regarding discrepancy. Member states she was called yesterday and instructed to stop taking Torsemide. However, per member's chart, she is to take Torsemide every other day. Message left.  Plan: continue to follow.   Thea Silversmith, RN, MSN, [place] Coordinator Cell: [phone]

## 2016-01-06 NOTE — Patient Outreach (Addendum)
[facility] [facility]) Care Management  01/06/2016  Mia Contreras 1929/04/20 [id number]   Assessment: 82 year old with recent admission for heart failure. Member reports she has an appointment with a nephrologist next week that was arranged by her primary care physician. She has been seen a the advanced heart failure clinic. And will has an appointment with the wound clinic with Dr. Quentin Cornwall tomorrow. She reports her wound is beginning to heal. She is still active with home health nurse. Member states she continues to weigh self daily  Medications reviewed. Member reports she was called on yesterday and instructed to discontinued the Torsemide. However there is no notation of this in her chart. RNCM iinformed member there was not notes to say to stop Torsemide, but to take every other day. Member seemed clear that someone called her and instructed her to "discontinue" the Torsemide.  Plan: RNCM will call advanced heart failure clinic to inform, transition of care call next week.  Thea Silversmith, RN, MSN, [place] Coordinator Cell: [phone]

## 2016-01-07 ENCOUNTER — Ambulatory Visit (HOSPITAL_COMMUNITY)
Admission: RE | Admit: 2016-01-07 | Discharge: 2016-01-07 | Disposition: A | Discharge status: Home / Self Care | Payer: Medicare Other | Source: Ambulatory Visit | Attending: Internal Medicine | Admitting: Internal Medicine

## 2016-01-07 DIAGNOSIS — I5023 Acute on chronic systolic (congestive) heart failure: Secondary | ICD-10-CM | POA: Insufficient documentation

## 2016-01-07 DIAGNOSIS — M1990 Unspecified osteoarthritis, unspecified site: Secondary | ICD-10-CM | POA: Diagnosis not present

## 2016-01-07 DIAGNOSIS — I872 Venous insufficiency (chronic) (peripheral): Secondary | ICD-10-CM | POA: Diagnosis not present

## 2016-01-07 DIAGNOSIS — I87332 Chronic venous hypertension (idiopathic) with ulcer and inflammation of left lower extremity: Secondary | ICD-10-CM | POA: Diagnosis not present

## 2016-01-07 DIAGNOSIS — I509 Heart failure, unspecified: Secondary | ICD-10-CM | POA: Diagnosis not present

## 2016-01-07 DIAGNOSIS — L97821 Non-pressure chronic ulcer of other part of left lower leg limited to breakdown of skin: Secondary | ICD-10-CM | POA: Diagnosis not present

## 2016-01-07 DIAGNOSIS — J449 Chronic obstructive pulmonary disease, unspecified: Secondary | ICD-10-CM | POA: Diagnosis not present

## 2016-01-07 DIAGNOSIS — L97811 Non-pressure chronic ulcer of other part of right lower leg limited to breakdown of skin: Secondary | ICD-10-CM | POA: Diagnosis not present

## 2016-01-07 LAB — BASIC METABOLIC PANEL
ANION GAP: 13 (ref 5–15)
BUN: 65 mg/dL — ABNORMAL HIGH (ref 6–20)
CO2: 27 mmol/L (ref 22–32)
Calcium: 9.6 mg/dL (ref 8.9–10.3)
Chloride: 97 mmol/L — ABNORMAL LOW (ref 101–111)
Creatinine, Ser: 2.93 mg/dL — ABNORMAL HIGH (ref 0.44–1.00)
GFR, EST AFRICAN AMERICAN: 16 mL/min — AB (ref >60)
GFR, EST NON AFRICAN AMERICAN: 14 mL/min — AB (ref >60)
GLUCOSE: 98 mg/dL (ref 65–99)
POTASSIUM: 4.9 mmol/L (ref 3.5–5.1)
SODIUM: 137 mmol/L (ref 135–145)

## 2016-01-10 DIAGNOSIS — J449 Chronic obstructive pulmonary disease, unspecified: Secondary | ICD-10-CM | POA: Diagnosis not present

## 2016-01-10 DIAGNOSIS — L97821 Non-pressure chronic ulcer of other part of left lower leg limited to breakdown of skin: Secondary | ICD-10-CM | POA: Diagnosis not present

## 2016-01-10 DIAGNOSIS — S81801A Unspecified open wound, right lower leg, initial encounter: Secondary | ICD-10-CM | POA: Diagnosis not present

## 2016-01-10 DIAGNOSIS — I87332 Chronic venous hypertension (idiopathic) with ulcer and inflammation of left lower extremity: Secondary | ICD-10-CM | POA: Diagnosis not present

## 2016-01-10 DIAGNOSIS — I509 Heart failure, unspecified: Secondary | ICD-10-CM | POA: Diagnosis not present

## 2016-01-10 DIAGNOSIS — I872 Venous insufficiency (chronic) (peripheral): Secondary | ICD-10-CM | POA: Diagnosis not present

## 2016-01-10 DIAGNOSIS — L97811 Non-pressure chronic ulcer of other part of right lower leg limited to breakdown of skin: Secondary | ICD-10-CM | POA: Diagnosis not present

## 2016-01-10 DIAGNOSIS — M1990 Unspecified osteoarthritis, unspecified site: Secondary | ICD-10-CM | POA: Diagnosis not present

## 2016-01-11 ENCOUNTER — Encounter: Payer: Self-pay | Admitting: Licensed Clinical Social Worker

## 2016-01-11 ENCOUNTER — Ambulatory Visit (HOSPITAL_COMMUNITY)
Admission: RE | Admit: 2016-01-11 | Discharge: 2016-01-11 | Disposition: A | Discharge status: Home / Self Care | Payer: Medicare Other | Source: Ambulatory Visit | Attending: Cardiology | Admitting: Cardiology

## 2016-01-11 VITALS — BP 88/64 | HR 96 | Wt 130.8 lb

## 2016-01-11 DIAGNOSIS — I5023 Acute on chronic systolic (congestive) heart failure: Secondary | ICD-10-CM | POA: Insufficient documentation

## 2016-01-11 DIAGNOSIS — Z7901 Long term (current) use of anticoagulants: Secondary | ICD-10-CM | POA: Insufficient documentation

## 2016-01-11 DIAGNOSIS — J449 Chronic obstructive pulmonary disease, unspecified: Secondary | ICD-10-CM | POA: Diagnosis not present

## 2016-01-11 DIAGNOSIS — Z85828 Personal history of other malignant neoplasm of skin: Secondary | ICD-10-CM | POA: Diagnosis not present

## 2016-01-11 DIAGNOSIS — I491 Atrial premature depolarization: Secondary | ICD-10-CM | POA: Insufficient documentation

## 2016-01-11 DIAGNOSIS — I872 Venous insufficiency (chronic) (peripheral): Secondary | ICD-10-CM | POA: Insufficient documentation

## 2016-01-11 DIAGNOSIS — D649 Anemia, unspecified: Secondary | ICD-10-CM | POA: Diagnosis not present

## 2016-01-11 DIAGNOSIS — I482 Chronic atrial fibrillation, unspecified: Secondary | ICD-10-CM

## 2016-01-11 DIAGNOSIS — I5022 Chronic systolic (congestive) heart failure: Secondary | ICD-10-CM

## 2016-01-11 DIAGNOSIS — E0590 Thyrotoxicosis, unspecified without thyrotoxic crisis or storm: Secondary | ICD-10-CM | POA: Insufficient documentation

## 2016-01-11 DIAGNOSIS — Z888 Allergy status to other drugs, medicaments and biological substances status: Secondary | ICD-10-CM | POA: Insufficient documentation

## 2016-01-11 DIAGNOSIS — Z87891 Personal history of nicotine dependence: Secondary | ICD-10-CM | POA: Insufficient documentation

## 2016-01-11 DIAGNOSIS — E785 Hyperlipidemia, unspecified: Secondary | ICD-10-CM | POA: Insufficient documentation

## 2016-01-11 DIAGNOSIS — I129 Hypertensive chronic kidney disease with stage 1 through stage 4 chronic kidney disease, or unspecified chronic kidney disease: Secondary | ICD-10-CM | POA: Diagnosis not present

## 2016-01-11 DIAGNOSIS — N184 Chronic kidney disease, stage 4 (severe): Secondary | ICD-10-CM | POA: Diagnosis not present

## 2016-01-11 DIAGNOSIS — I5043 Acute on chronic combined systolic (congestive) and diastolic (congestive) heart failure: Secondary | ICD-10-CM | POA: Diagnosis not present

## 2016-01-11 DIAGNOSIS — Z79899 Other long term (current) drug therapy: Secondary | ICD-10-CM | POA: Diagnosis not present

## 2016-01-11 DIAGNOSIS — I429 Cardiomyopathy, unspecified: Secondary | ICD-10-CM | POA: Insufficient documentation

## 2016-01-11 DIAGNOSIS — I130 Hypertensive heart and chronic kidney disease with heart failure and stage 1 through stage 4 chronic kidney disease, or unspecified chronic kidney disease: Secondary | ICD-10-CM | POA: Diagnosis not present

## 2016-01-11 DIAGNOSIS — Z9221 Personal history of antineoplastic chemotherapy: Secondary | ICD-10-CM | POA: Diagnosis not present

## 2016-01-11 DIAGNOSIS — K589 Irritable bowel syndrome without diarrhea: Secondary | ICD-10-CM | POA: Insufficient documentation

## 2016-01-11 DIAGNOSIS — I4891 Unspecified atrial fibrillation: Secondary | ICD-10-CM | POA: Insufficient documentation

## 2016-01-11 DIAGNOSIS — N179 Acute kidney failure, unspecified: Secondary | ICD-10-CM | POA: Diagnosis not present

## 2016-01-11 DIAGNOSIS — N182 Chronic kidney disease, stage 2 (mild): Secondary | ICD-10-CM | POA: Diagnosis not present

## 2016-01-11 DIAGNOSIS — I428 Other cardiomyopathies: Secondary | ICD-10-CM

## 2016-01-11 DIAGNOSIS — N2581 Secondary hyperparathyroidism of renal origin: Secondary | ICD-10-CM | POA: Diagnosis not present

## 2016-01-11 DIAGNOSIS — I480 Paroxysmal atrial fibrillation: Secondary | ICD-10-CM | POA: Diagnosis not present

## 2016-01-11 DIAGNOSIS — Z853 Personal history of malignant neoplasm of breast: Secondary | ICD-10-CM | POA: Diagnosis not present

## 2016-01-11 DIAGNOSIS — D631 Anemia in chronic kidney disease: Secondary | ICD-10-CM | POA: Diagnosis not present

## 2016-01-11 MED ORDER — TORSEMIDE 20 MG PO TABS: 40.0000 mg | ORAL_TABLET | Freq: Every day | ORAL | Status: DC

## 2016-01-11 NOTE — Progress Notes (Signed)
Advanced Heart Failure Medication Review by a Pharmacist  Does the patient  feel that his/her medications are working for him/her?  yes  Has the patient been experiencing any side effects to the medications prescribed?  no  Does the patient measure his/her own blood pressure or blood glucose at home?  yes   Does the patient have any problems obtaining medications due to transportation or finances?   no  Understanding of regimen: good Understanding of indications: good Potential of compliance: good Patient understands to avoid NSAIDs. Patient understands to avoid decongestants.  Issues to address at subsequent visits: None   Pharmacist comments:  Ms. Bohlinger is a pleasant 82 yo F presenting with her daughter but without a medication list. She reports good compliance with her regimen and she manages her own medications. She does state that her legs have been more swollen and are seeping more than usual over the past week or so. She had been taking her torsemide QOD but was told yesterday to start taking it daily. She did not have any other medication-related questions or concerns for me at this time.   Ruta Hinds. Velva Harman, PharmD, BCPS, CPP Clinical Pharmacist Pager: [phone] Phone: [phone] 01/11/2016 10:21 AM      Time with patient: 10 minutes Preparation and documentation time: 2 minutes Total time: 12 minutes

## 2016-01-11 NOTE — Progress Notes (Signed)
CSW met with patient and niece in the clinic to assist with care needs at home. Patient is an 82yo female who resides alone. Her only family is a niece who lives in [place] and a nephew in [place]. Patient's brother resides in [place] and handles finances although overwhelmed with his own health issues at this time. 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 Her niece has encouraged her to get a medic alert system and she reluctantly agreed last week to have "[employer]" come to assist with housekeeping and errands 1x monthly. CSW discussed at length concerns about patient alone in the home and need for added services to assist with independence. Patient adamant about remaining in her home and avoiding NHP as "I have seen others in those places and would rather not".  Patient agreed to have current home services x2 weekly to assist with housekeeping, meal preparation and errands. Patient will also have her niece call today to [place] to obtain medic alert system. CSW discussed the value of the [employer] and both patient and niece agreeable to srat program. CSW introduced patient and niece to [place] and hopeful for paramedic visit end of this week. Patient appears agreeable to additional services in the home and willing to work with paramedic and CSW on going. Niece appears grateful for support and intervention as she will return to [facility] today and states she worries about her Aunt and feels relived with interventions provided by HF clinic. CSW will continue to follow through clinic and paramedicine program. Raquel Sarna, LCSW [phone]

## 2016-01-11 NOTE — Patient Instructions (Signed)
INCREASE Torsemide to 40 mg (2 tabs) daily  Your physician recommends that you schedule a follow-up appointment in: 2 weeks  Do the following things EVERYDAY: 1) Weigh yourself in the morning before breakfast. Write it down and keep it in a log. 2) Take your medicines as prescribed 3) Eat low salt foods-Limit salt (sodium) to 2000 mg per day.  4) Stay as active as you can everyday 5) Limit all fluids for the day to less than 2 liters 6)

## 2016-01-11 NOTE — Progress Notes (Signed)
Patient ID: [name], female   DOB: 06-Sep-1928, 82 y.o.   MRN: [id number]    Advanced Heart Failure Clinic Note   Referring Physician: Dr Mia Contreras Primary Care: Mia Lindau, MD Primary Cardiologist: Dr Mia Contreras Primary HF: Dr. Haroldine Contreras HPI: Mia Contreras is a 82 y.o. female with a history of nonischemic cardiomyopathy, systolic CHF, PACs, L Breast cancer in 2000 with surgery and chemo.  Echo 12/16/14  Normal LV size with EF 30-35%, diffuse hypokinesis. Normal RVsize with mildly decreased systolic function. Moderate to severeLAE. Mild MR. Moderate pulmonary hypertension.  She has never had a cath. Had 4 rounds of chemo for breast CA. Doesn't remember which agents she got. Does not recall a red medicine.   Admitted 4/25 -12/19/15 with A/C combined CHF and AKI. Creatinine improved.  She diuresed 12 L and down 19 lbs from highest weight that admission. Discharge weight 134 lbs.   She presents today for increased edema. Last visit she had worsening renal function. She was set up for renal ultrasound. Saw wound care and she was instructed to take contact HF and schedule appointment. Yesterday torsemide was increased to 20 mg daily. She has not been taking advil, aleve, or motrin. SOB with exertion. Denies PND/orthopnea. Ambulates with a walker. Lives alone. Having difficulty driving.   [id number] ECHO EF 45-50%   Recent labs: 12/01/15: Sodium 140 K 5.0 creatinine 2.5 (was 1.3 in 3/17). Albumin 3.8 BNP 1090 12/19/15: K 4.0, Creatinine 1.67 12/28/15: K 5.4, Creatinine 2.26 12/31/15 K 4.8, creatinine 2.49 01/07/2016 K 4.9 Creatinine 2.93   FH: Brother also has CAD and afib, Mother had CABG but did well, Father passed away from MI at age 73 thought to be 2/2 to heavy ETOH use.  SH:  Pt has a distant history of smoking. Pt has 50-60 pack years up to 2ppd. Quit in 1982.   Past Medical History  Diagnosis Date  . GOITER, MULTINODULAR 05/06/2010    Dr Mia Contreras  . HYPERTHYROIDISM 05/23/2010  .  HYPERLIPIDEMIA 08/20/2007  . ANEMIA-NOS 08/20/2007  . ANXIETY 03/16/2007  . CARPAL TUNNEL SYNDROME, RIGHT 01/11/2008  . HYPERTENSION 03/16/2007  . VENOUS INSUFFICIENCY 09/05/2007  . DIVERTICULOSIS, COLON 08/20/2007  . CELLULITIS, LEG, RIGHT 08/20/2007  . OSTEOARTHRITIS 03/16/2007  . OSTEOPOROSIS 08/20/2007  . SCOLIOSIS 01/11/2008  . BREAST CANCER, HX OF 03/16/2007  . SKIN CANCER, HX OF 11/12/2008     R cheek 2010, L Cheek 2011 Dr. Tonia Contreras  . PREMATURE ATRIAL CONTRACTIONS 09/21/2010  . Thyroid nodule   . Cataract     floaters  . Pneumonia 07/2011    took abx for several weeks  . IBS (irritable bowel syndrome)   . Collagenous colitis     Current Outpatient Prescriptions  Medication Sig Dispense Refill  . ALPRAZolam (XANAX) 0.5 MG tablet Take 1 tablet (0.5 mg total) by mouth 2 (two) times daily as needed for anxiety or sleep. 60 tablet 3  . amiodarone (PACERONE) 200 MG tablet Take 1 tablet (200 mg total) by mouth 2 (two) times daily. 60 tablet 0  . apixaban (ELIQUIS) 2.5 MG TABS tablet Take 1 tablet (2.5 mg total) by mouth 2 (two) times daily. 180 tablet 3  . Calcium Carbonate-Vitamin D (CALCIUM 600+D) 600-400 MG-UNIT per tablet Take 1 tablet by mouth daily.    . carvedilol (COREG) 12.5 MG tablet Take 1 tablet (12.5 mg total) by mouth 2 (two) times daily with a meal. 180 tablet 3  . Fluticasone-Salmeterol (ADVAIR DISKUS) 250-50 MCG/DOSE AEPB Inhale 1  puff into the lungs 2 (two) times daily. 60 each 3  . Multiple Vitamins-Minerals (MULTIVITAMIN PO) Take 1 tablet by mouth daily.     Marland Kitchen torsemide (DEMADEX) 20 MG tablet Take 20 mg by mouth every other day. Reported on 01/06/2016     No current facility-administered medications for this encounter.    Allergies  Allergen Reactions  . Cefuroxime Diarrhea  . Celecoxib Nausea Only  . Deltasone [Prednisone]     Oral deltasone is causing swelling (can use Depo-Medrol)  . Ranitidine     bloating  . Tramadol Hcl Nausea Only      Social History     Social History  . Marital Status: Single    Spouse Name: N/A  . Number of Children: N/A  . Years of Education: N/A   Occupational History  . Not on file.   Social History Main Topics  . Smoking status: Former Smoker    Quit date: 12/20/1980  . Smokeless tobacco: Never Used  . Alcohol Use: Yes     Comment: socially  . Drug Use: No  . Sexual Activity: Not Currently   Other Topics Concern  . Not on file   Social History Narrative      Family History  Problem Relation Age of Onset  . Dementia Mother   . Heart disease Mother   . Mental retardation Mother   . Colon cancer Neg Hx   . Hypertension Mother   . Alzheimer's disease Mother   . Prostate cancer Brother   . Diabetes Other   . Heart attack Father   . Alcohol abuse Father   . Anesthesia problems Neg Hx     Filed Vitals:   01/11/16 1008  BP: 88/64  Pulse: 96  Weight: 130 lb 12.8 oz (59.33 kg)  SpO2: 90%   Wt Readings from Last 3 Encounters:  01/11/16 130 lb 12.8 oz (59.33 kg)  01/05/16 131 lb 3.2 oz (59.512 kg)  12/30/15 128 lb 6.4 oz (58.242 kg)    PHYSICAL EXAM: General:  Elderly. Sitting in [place]. NAD. Nice present.  HEENT: normal Neck: supple. JVP ~11-12. Carotids 2+ bilat; no bruits. No thyromegaly or nodule noted.  Cor: PMI nondisplaced. Irregular rate & rhythm. No rubs, gallops or murmurs. Lungs: CTAB, normal effort.  Abdomen: soft, NT, ND, no HSM. No bruits or masses. +BS  Extremities: no cyanosis, clubbing, rash. R and LLE wrapped with coban. Above wrap she has 2+ edema.  Neuro: alert & oriented x 3, cranial nerves grossly intact. moves all 4 extremities w/o difficulty. Affect pleasant.  ASSESSMENT & PLAN: 1. Acute/Chronic systolic HF with R>>L symptoms. ECHO 11/2015 EF 45-50%.  NYHA III. Volume status elevated. Increase torsemide to 40 mg daily.  Considered IV lasix today but she would like to see nephrology today.  2. AKI/CKD- Creatinine trending up. Increasing diuretics as above.   3. LE  wounds- Followed at the wound center.  4.  H/o COPD 5. H/o breast cancer s/p chemo 2000 6. Chronic A fib- Rate controlled. On Eliquis twice a day.   Offered hospital admit however she declined. Difficult to manage given poor insight and limited resources at home. She is at high risk for readmit.  Referred to HFSW for assistance with possible transition to assisted living. Also need to determine goals of care. HF SW will follow at next visit.  Referred to paramedicine for assistance with medications.   Follow up 2 weeks.   Darrick Grinder, NP-C  01/11/2016

## 2016-01-12 ENCOUNTER — Telehealth (HOSPITAL_COMMUNITY): Payer: Self-pay | Admitting: Surgery

## 2016-01-12 DIAGNOSIS — J449 Chronic obstructive pulmonary disease, unspecified: Secondary | ICD-10-CM | POA: Diagnosis not present

## 2016-01-12 DIAGNOSIS — I130 Hypertensive heart and chronic kidney disease with heart failure and stage 1 through stage 4 chronic kidney disease, or unspecified chronic kidney disease: Secondary | ICD-10-CM | POA: Diagnosis not present

## 2016-01-12 DIAGNOSIS — I480 Paroxysmal atrial fibrillation: Secondary | ICD-10-CM | POA: Diagnosis not present

## 2016-01-12 DIAGNOSIS — N182 Chronic kidney disease, stage 2 (mild): Secondary | ICD-10-CM | POA: Diagnosis not present

## 2016-01-12 DIAGNOSIS — I5043 Acute on chronic combined systolic (congestive) and diastolic (congestive) heart failure: Secondary | ICD-10-CM | POA: Diagnosis not present

## 2016-01-12 DIAGNOSIS — I872 Venous insufficiency (chronic) (peripheral): Secondary | ICD-10-CM | POA: Diagnosis not present

## 2016-01-12 MED ORDER — TORSEMIDE 20 MG PO TABS: 40.0000 mg | ORAL_TABLET | Freq: Every day | ORAL | Status: DC

## 2016-01-12 NOTE — Telephone Encounter (Signed)
Danielle called and said that patient had lab work done yesterday at Dr [employer] office.  They are to fax lab results to [facility] in order to avoid patient to be stuck again today for labs ordered via [facility] by Oda Kilts.

## 2016-01-12 NOTE — Telephone Encounter (Signed)
Mia Contreras with [facility] called to make [name] aware that Dr Justin Mend had made changes to patient's Torsemide dose --40 mg until Sunday then back to 20 mg daily.  He also stopped Coreg.  Med list updated.

## 2016-01-13 ENCOUNTER — Other Ambulatory Visit: Payer: Self-pay

## 2016-01-13 NOTE — Patient Outreach (Signed)
[facility]) Care Management  01/13/2016  Mia Contreras 1929-08-12 [id number]  RNCM called to complete transition of care call. No answer.   Plan: continue to follow. Telephonic transition of care call next week.  Thea Silversmith, RN, MSN, [place] Coordinator Cell: [phone]

## 2016-01-14 ENCOUNTER — Other Ambulatory Visit: Payer: Self-pay

## 2016-01-14 DIAGNOSIS — I872 Venous insufficiency (chronic) (peripheral): Secondary | ICD-10-CM | POA: Diagnosis not present

## 2016-01-14 DIAGNOSIS — S81802A Unspecified open wound, left lower leg, initial encounter: Secondary | ICD-10-CM | POA: Diagnosis not present

## 2016-01-14 DIAGNOSIS — I87333 Chronic venous hypertension (idiopathic) with ulcer and inflammation of bilateral lower extremity: Secondary | ICD-10-CM | POA: Diagnosis not present

## 2016-01-14 DIAGNOSIS — L97821 Non-pressure chronic ulcer of other part of left lower leg limited to breakdown of skin: Secondary | ICD-10-CM | POA: Diagnosis not present

## 2016-01-14 DIAGNOSIS — L97811 Non-pressure chronic ulcer of other part of right lower leg limited to breakdown of skin: Secondary | ICD-10-CM | POA: Diagnosis not present

## 2016-01-14 DIAGNOSIS — I5043 Acute on chronic combined systolic (congestive) and diastolic (congestive) heart failure: Secondary | ICD-10-CM | POA: Diagnosis not present

## 2016-01-14 DIAGNOSIS — N182 Chronic kidney disease, stage 2 (mild): Secondary | ICD-10-CM | POA: Diagnosis not present

## 2016-01-14 DIAGNOSIS — I130 Hypertensive heart and chronic kidney disease with heart failure and stage 1 through stage 4 chronic kidney disease, or unspecified chronic kidney disease: Secondary | ICD-10-CM | POA: Diagnosis not present

## 2016-01-14 DIAGNOSIS — M1990 Unspecified osteoarthritis, unspecified site: Secondary | ICD-10-CM | POA: Diagnosis not present

## 2016-01-14 DIAGNOSIS — I480 Paroxysmal atrial fibrillation: Secondary | ICD-10-CM | POA: Diagnosis not present

## 2016-01-14 DIAGNOSIS — J449 Chronic obstructive pulmonary disease, unspecified: Secondary | ICD-10-CM | POA: Diagnosis not present

## 2016-01-14 DIAGNOSIS — I509 Heart failure, unspecified: Secondary | ICD-10-CM | POA: Diagnosis not present

## 2016-01-14 MED ORDER — AMIODARONE HCL 200 MG PO TABS: 200.0000 mg | ORAL_TABLET | Freq: Two times a day (BID) | ORAL | Status: DC

## 2016-01-18 DIAGNOSIS — I480 Paroxysmal atrial fibrillation: Secondary | ICD-10-CM | POA: Diagnosis not present

## 2016-01-18 DIAGNOSIS — I130 Hypertensive heart and chronic kidney disease with heart failure and stage 1 through stage 4 chronic kidney disease, or unspecified chronic kidney disease: Secondary | ICD-10-CM | POA: Diagnosis not present

## 2016-01-18 DIAGNOSIS — I5043 Acute on chronic combined systolic (congestive) and diastolic (congestive) heart failure: Secondary | ICD-10-CM | POA: Diagnosis not present

## 2016-01-18 DIAGNOSIS — J449 Chronic obstructive pulmonary disease, unspecified: Secondary | ICD-10-CM | POA: Diagnosis not present

## 2016-01-18 DIAGNOSIS — I872 Venous insufficiency (chronic) (peripheral): Secondary | ICD-10-CM | POA: Diagnosis not present

## 2016-01-18 DIAGNOSIS — N182 Chronic kidney disease, stage 2 (mild): Secondary | ICD-10-CM | POA: Diagnosis not present

## 2016-01-19 ENCOUNTER — Ambulatory Visit: Payer: Self-pay

## 2016-01-19 ENCOUNTER — Ambulatory Visit: Payer: Medicare Other | Admitting: Cardiovascular Disease

## 2016-01-20 ENCOUNTER — Other Ambulatory Visit: Payer: Self-pay

## 2016-01-20 ENCOUNTER — Encounter (HOSPITAL_COMMUNITY): Payer: Medicare Other

## 2016-01-20 DIAGNOSIS — N179 Acute kidney failure, unspecified: Secondary | ICD-10-CM

## 2016-01-20 HISTORY — DX: Acute kidney failure, unspecified: N17.9

## 2016-01-20 NOTE — Patient Outreach (Signed)
[facility]) Care Management  01/20/2016  Mia Contreras 10/20/1928 [id number]  RNCM called for transition of care. Member reports she is doing well, states she has been weighing and recording weights. Weight range between 127.6-129 pounds. Member has been following up with providers(primary care; advanced heart failure clinic; cardiology; Dr. Justin Mend, nephrologist. Member is also being seen at the wound clinic weekly. Ms. Pricer reports her leg wound is healing slowly.  RNCM reviewed heart failure zone tool, discussed ways to manage heart failure  RNCM confirmed member has RNCM's contact number and encouraged to call as needed.  Plan: continue in the heart failure program, home visit.  Thea Silversmith, RN, MSN, [place] Coordinator Cell: [phone]

## 2016-01-21 ENCOUNTER — Ambulatory Visit (HOSPITAL_COMMUNITY): Payer: Medicare Other

## 2016-01-21 NOTE — Progress Notes (Signed)
Patient ID: Mia Contreras, female   DOB: 1929/01/27, 82 y.o.   MRN: [id number]   Mia Contreras is a 82 y.o.  female with a history of nonischemic cardiomyopathy, systolic CHF, PACs, breast cancer. I saw her in f/u 08/22/13. She continued to noted DOE. She admitted to being out of her Advair for ~ 2 mos. I refilled this. Her BNP remained elevated on Lasix and aldactone added  Lexiscan Myoview (04/2011): No ischemia, EF 32%.   Has been to wound center before for slowly healing leg wounds.    Echo 12/16/14 reviewed: Impressions:  - Normal LV size with EF 30-35%, diffuse hypokinesis. Normal RV size with mildly decreased systolic function. Moderate to severe LAE. Mild MR. Moderate pulmonary hypertension.  Admitted 4/25 -12/19/15 with A/C combined CHF and AKI. Creatinine improved. She diuresed 12 L and down 19 lbs from highest weight that admission. Discharge weight 134 lbs. Aldactone stopped due to azotemia.  Recent  No cath recommended due to comorbidities, age and renal failure  Noted afib in hosptial.  Started on eliquis 2.5 bid (due to age and renal function)  and amiodarone   She continues to have issues with weeping legs.  Needs more diuretic for legs to heal  Lab Results  Component Value Date   CREATININE 2.93* 01/07/2016   BUN 65* 01/07/2016   NA 137 01/07/2016   K 4.9 01/07/2016   CL 97* 01/07/2016   CO2 27 01/07/2016     ROS: Denies fever, malais, weight loss, blurry vision, decreased visual acuity, cough, sputum, SOB, hemoptysis, pleuritic pain, palpitaitons, heartburn, abdominal pain, melena, lower extremity edema, claudication, or rash.  All other systems reviewed and negative  General: Affect appropriate Chronically ill white female  HEENT: normal Neck supple with no adenopathy JVP normal no bruits no thyromegaly Lungs clear with no wheezing and good diaphragmatic motion Heart:  S1/S2 SEM  murmur, no rub, gallop or click PMI normal Abdomen: benighn, BS positve,  no tenderness, no AAA no bruit.  No HSM or HJR Distal pulses intact with no bruits Chronic venous disease bilaterally with plus 3 LLE edema  LLE wrapped with bilateral bullae  Neuro non-focal Skin warm and dry No muscular weakness   Current Outpatient Prescriptions  Medication Sig Dispense Refill  . ALPRAZolam (XANAX) 0.5 MG tablet Take 1 tablet (0.5 mg total) by mouth 2 (two) times daily as needed for anxiety or sleep. 60 tablet 3  . amiodarone (PACERONE) 200 MG tablet Take 1 tablet (200 mg total) by mouth 2 (two) times daily. 60 tablet 0  . apixaban (ELIQUIS) 2.5 MG TABS tablet Take 1 tablet (2.5 mg total) by mouth 2 (two) times daily. 180 tablet 3  . Calcium Carbonate-Vitamin D (CALCIUM 600+D) 600-400 MG-UNIT per tablet Take 1 tablet by mouth daily.    . Fluticasone-Salmeterol (ADVAIR DISKUS) 250-50 MCG/DOSE AEPB Inhale 1 puff into the lungs 2 (two) times daily. 60 each 3  . Multiple Vitamins-Minerals (MULTIVITAMIN PO) Take 1 tablet by mouth daily.     Marland Kitchen torsemide (DEMADEX) 20 MG tablet Take 20 mg by mouth daily. On Monday, Wednesday and Friday take twice daily     No current facility-administered medications for this visit.    Allergies  Cefuroxime; Celecoxib; Deltasone; Ranitidine; and Tramadol hcl  Electrocardiogram:   06/10/14  SR rate 39 LAD old IMI  Long PR  PAC   12/09/14  SR rate 63  LAD poor R wave progression   Assessment and Plan CHF  F/u  CHF clinic low BP with entresto, azotemia precludes aldactone demedex adjusted down by renal told her to take demedex bid M/W/F No cath due to age comorbidities   Had long talk with son about advanced directives and possible poor outcomes in near future  Also advised her to go to [facility] for 2 weeks And then possible assisted living. Son [place] out [place] and has health issues has a niece in [place] as well as a part time caretaker who  Was with her today   Venous Disease:  Legs wrapped continue diuretic .  Has f/u wound center next  week COPD:  Continue advair no active wheezing claritin and flonase for allergies contributes to dyspnea  HTN:  Low at this point due to DCM and diarea    Jenkins Rouge

## 2016-01-24 ENCOUNTER — Telehealth: Payer: Self-pay | Admitting: Cardiovascular Disease

## 2016-01-24 ENCOUNTER — Telehealth: Payer: Self-pay

## 2016-01-24 ENCOUNTER — Ambulatory Visit (INDEPENDENT_AMBULATORY_CARE_PROVIDER_SITE_OTHER): Payer: Medicare Other | Admitting: Cardiovascular Disease

## 2016-01-24 ENCOUNTER — Encounter (HOSPITAL_BASED_OUTPATIENT_CLINIC_OR_DEPARTMENT_OTHER): Payer: No Typology Code available for payment source | Attending: Internal Medicine

## 2016-01-24 ENCOUNTER — Encounter: Payer: Self-pay | Admitting: Cardiovascular Disease

## 2016-01-24 VITALS — BP 90/54 | HR 109 | Ht 63.0 in | Wt 129.8 lb

## 2016-01-24 DIAGNOSIS — I890 Lymphedema, not elsewhere classified: Secondary | ICD-10-CM | POA: Insufficient documentation

## 2016-01-24 DIAGNOSIS — N183 Chronic kidney disease, stage 3 (moderate): Secondary | ICD-10-CM | POA: Insufficient documentation

## 2016-01-24 DIAGNOSIS — J449 Chronic obstructive pulmonary disease, unspecified: Secondary | ICD-10-CM | POA: Insufficient documentation

## 2016-01-24 DIAGNOSIS — L97821 Non-pressure chronic ulcer of other part of left lower leg limited to breakdown of skin: Secondary | ICD-10-CM | POA: Insufficient documentation

## 2016-01-24 DIAGNOSIS — I130 Hypertensive heart and chronic kidney disease with heart failure and stage 1 through stage 4 chronic kidney disease, or unspecified chronic kidney disease: Secondary | ICD-10-CM | POA: Insufficient documentation

## 2016-01-24 DIAGNOSIS — I509 Heart failure, unspecified: Secondary | ICD-10-CM | POA: Diagnosis not present

## 2016-01-24 DIAGNOSIS — S81802A Unspecified open wound, left lower leg, initial encounter: Secondary | ICD-10-CM | POA: Diagnosis not present

## 2016-01-24 DIAGNOSIS — L97811 Non-pressure chronic ulcer of other part of right lower leg limited to breakdown of skin: Secondary | ICD-10-CM | POA: Insufficient documentation

## 2016-01-24 DIAGNOSIS — Z923 Personal history of irradiation: Secondary | ICD-10-CM | POA: Insufficient documentation

## 2016-01-24 DIAGNOSIS — I87333 Chronic venous hypertension (idiopathic) with ulcer and inflammation of bilateral lower extremity: Secondary | ICD-10-CM | POA: Diagnosis not present

## 2016-01-24 DIAGNOSIS — M1990 Unspecified osteoarthritis, unspecified site: Secondary | ICD-10-CM | POA: Insufficient documentation

## 2016-01-24 DIAGNOSIS — Z9221 Personal history of antineoplastic chemotherapy: Secondary | ICD-10-CM | POA: Insufficient documentation

## 2016-01-24 MED ORDER — TORSEMIDE 20 MG PO TABS: ORAL_TABLET | ORAL | Status: DC

## 2016-01-24 NOTE — Patient Instructions (Addendum)
Medication Instructions:  Your physician recommends that you continue on your current medications as directed. Please refer to the Current Medication list given to you today.  Labwork: NONE  Testing/Procedures: NONE  Follow-Up: Your physician wants you to follow-up in: 3 months with Dr. [name].   If you need a refill on your cardiac medications before your next appointment, please call your pharmacy.    

## 2016-01-24 NOTE — Telephone Encounter (Signed)
Family is concerned about patient's safety at home, and patient being clueless about her prognosis. Family states that patient's health has been declining, and she recently fell at home. They think patient needs round the clock care, and should not be left at home alone. Family stated patient was told not to drive and drove anyway. Patient's family feels that patient needs to be told the medical facts about her situation, and they think the patient will listen to Dr. Johnsie Cancel. Will let Dr. Johnsie Cancel know family's request.

## 2016-01-24 NOTE — Telephone Encounter (Signed)
Patient is going to a home skill place. The family needs a referral. Can you please call the daughter about this. They are trying to a place lined up.

## 2016-01-24 NOTE — Telephone Encounter (Signed)
Pt has appt today-pt having issues and neice wants to talk with nurse before appt today at 330pm   [phone]

## 2016-01-24 NOTE — Telephone Encounter (Signed)
Patient was seen by Dr. Johnsie Cancel today- see office visit note.

## 2016-01-25 DIAGNOSIS — I872 Venous insufficiency (chronic) (peripheral): Secondary | ICD-10-CM | POA: Diagnosis not present

## 2016-01-25 DIAGNOSIS — J449 Chronic obstructive pulmonary disease, unspecified: Secondary | ICD-10-CM | POA: Diagnosis not present

## 2016-01-25 DIAGNOSIS — I130 Hypertensive heart and chronic kidney disease with heart failure and stage 1 through stage 4 chronic kidney disease, or unspecified chronic kidney disease: Secondary | ICD-10-CM | POA: Diagnosis not present

## 2016-01-25 DIAGNOSIS — I480 Paroxysmal atrial fibrillation: Secondary | ICD-10-CM | POA: Diagnosis not present

## 2016-01-25 DIAGNOSIS — I5043 Acute on chronic combined systolic (congestive) and diastolic (congestive) heart failure: Secondary | ICD-10-CM | POA: Diagnosis not present

## 2016-01-25 DIAGNOSIS — N182 Chronic kidney disease, stage 2 (mild): Secondary | ICD-10-CM | POA: Diagnosis not present

## 2016-01-25 NOTE — Telephone Encounter (Signed)
Left detailed mess informing pt's daughter, [name] to check with facility and see what is needed and to call me back with specifics.

## 2016-01-26 ENCOUNTER — Ambulatory Visit (HOSPITAL_BASED_OUTPATIENT_CLINIC_OR_DEPARTMENT_OTHER)
Admission: RE | Admit: 2016-01-26 | Discharge: 2016-01-26 | Disposition: A | Discharge status: Home / Self Care | Payer: Medicare Other | Source: Ambulatory Visit | Attending: Cardiology | Admitting: Cardiology

## 2016-01-26 ENCOUNTER — Encounter: Payer: Self-pay | Admitting: Licensed Clinical Social Worker

## 2016-01-26 ENCOUNTER — Inpatient Hospital Stay (HOSPITAL_COMMUNITY)
Admission: AD | Admit: 2016-01-26 | Discharge: 2016-02-07 | DRG: 291 | Disposition: A | Discharge status: Skilled Nursing Facility | Payer: Medicare Other | Source: Ambulatory Visit | Attending: Internal Medicine | Admitting: Internal Medicine

## 2016-01-26 VITALS — BP 96/64 | HR 111 | Wt 129.6 lb

## 2016-01-26 DIAGNOSIS — Z7901 Long term (current) use of anticoagulants: Secondary | ICD-10-CM

## 2016-01-26 DIAGNOSIS — I428 Other cardiomyopathies: Secondary | ICD-10-CM

## 2016-01-26 DIAGNOSIS — I429 Cardiomyopathy, unspecified: Secondary | ICD-10-CM

## 2016-01-26 DIAGNOSIS — Z79899 Other long term (current) drug therapy: Secondary | ICD-10-CM

## 2016-01-26 DIAGNOSIS — R296 Repeated falls: Secondary | ICD-10-CM | POA: Insufficient documentation

## 2016-01-26 DIAGNOSIS — I959 Hypotension, unspecified: Secondary | ICD-10-CM | POA: Diagnosis not present

## 2016-01-26 DIAGNOSIS — N183 Chronic kidney disease, stage 3 (moderate): Secondary | ICD-10-CM | POA: Diagnosis not present

## 2016-01-26 DIAGNOSIS — Z8249 Family history of ischemic heart disease and other diseases of the circulatory system: Secondary | ICD-10-CM

## 2016-01-26 DIAGNOSIS — J440 Chronic obstructive pulmonary disease with acute lower respiratory infection: Secondary | ICD-10-CM | POA: Diagnosis present

## 2016-01-26 DIAGNOSIS — N179 Acute kidney failure, unspecified: Secondary | ICD-10-CM | POA: Diagnosis not present

## 2016-01-26 DIAGNOSIS — N182 Chronic kidney disease, stage 2 (mild): Secondary | ICD-10-CM

## 2016-01-26 DIAGNOSIS — Y95 Nosocomial condition: Secondary | ICD-10-CM | POA: Diagnosis present

## 2016-01-26 DIAGNOSIS — I482 Chronic atrial fibrillation: Secondary | ICD-10-CM | POA: Diagnosis present

## 2016-01-26 DIAGNOSIS — Z881 Allergy status to other antibiotic agents status: Secondary | ICD-10-CM

## 2016-01-26 DIAGNOSIS — W19XXXA Unspecified fall, initial encounter: Secondary | ICD-10-CM

## 2016-01-26 DIAGNOSIS — E440 Moderate protein-calorie malnutrition: Secondary | ICD-10-CM | POA: Diagnosis present

## 2016-01-26 DIAGNOSIS — I272 Other secondary pulmonary hypertension: Secondary | ICD-10-CM | POA: Diagnosis present

## 2016-01-26 DIAGNOSIS — I5043 Acute on chronic combined systolic (congestive) and diastolic (congestive) heart failure: Secondary | ICD-10-CM | POA: Diagnosis present

## 2016-01-26 DIAGNOSIS — I130 Hypertensive heart and chronic kidney disease with heart failure and stage 1 through stage 4 chronic kidney disease, or unspecified chronic kidney disease: Secondary | ICD-10-CM | POA: Diagnosis not present

## 2016-01-26 DIAGNOSIS — I5033 Acute on chronic diastolic (congestive) heart failure: Secondary | ICD-10-CM | POA: Diagnosis not present

## 2016-01-26 DIAGNOSIS — Z7951 Long term (current) use of inhaled steroids: Secondary | ICD-10-CM | POA: Diagnosis not present

## 2016-01-26 DIAGNOSIS — R0902 Hypoxemia: Secondary | ICD-10-CM | POA: Insufficient documentation

## 2016-01-26 DIAGNOSIS — J9601 Acute respiratory failure with hypoxia: Secondary | ICD-10-CM | POA: Diagnosis not present

## 2016-01-26 DIAGNOSIS — J189 Pneumonia, unspecified organism: Secondary | ICD-10-CM | POA: Diagnosis not present

## 2016-01-26 DIAGNOSIS — R41841 Cognitive communication deficit: Secondary | ICD-10-CM | POA: Diagnosis not present

## 2016-01-26 DIAGNOSIS — E875 Hyperkalemia: Secondary | ICD-10-CM | POA: Diagnosis not present

## 2016-01-26 DIAGNOSIS — I480 Paroxysmal atrial fibrillation: Secondary | ICD-10-CM | POA: Diagnosis not present

## 2016-01-26 DIAGNOSIS — F419 Anxiety disorder, unspecified: Secondary | ICD-10-CM | POA: Diagnosis not present

## 2016-01-26 DIAGNOSIS — R059 Cough, unspecified: Secondary | ICD-10-CM

## 2016-01-26 DIAGNOSIS — Z888 Allergy status to other drugs, medicaments and biological substances status: Secondary | ICD-10-CM

## 2016-01-26 DIAGNOSIS — R05 Cough: Secondary | ICD-10-CM

## 2016-01-26 DIAGNOSIS — R0600 Dyspnea, unspecified: Secondary | ICD-10-CM

## 2016-01-26 DIAGNOSIS — Z87891 Personal history of nicotine dependence: Secondary | ICD-10-CM | POA: Diagnosis not present

## 2016-01-26 DIAGNOSIS — N189 Chronic kidney disease, unspecified: Secondary | ICD-10-CM | POA: Diagnosis present

## 2016-01-26 DIAGNOSIS — Z6822 Body mass index (BMI) 22.0-22.9, adult: Secondary | ICD-10-CM

## 2016-01-26 DIAGNOSIS — J9621 Acute and chronic respiratory failure with hypoxia: Secondary | ICD-10-CM | POA: Diagnosis not present

## 2016-01-26 DIAGNOSIS — I481 Persistent atrial fibrillation: Secondary | ICD-10-CM | POA: Diagnosis not present

## 2016-01-26 DIAGNOSIS — Z853 Personal history of malignant neoplasm of breast: Secondary | ICD-10-CM

## 2016-01-26 DIAGNOSIS — J449 Chronic obstructive pulmonary disease, unspecified: Secondary | ICD-10-CM | POA: Diagnosis present

## 2016-01-26 DIAGNOSIS — Z9181 History of falling: Secondary | ICD-10-CM | POA: Diagnosis not present

## 2016-01-26 DIAGNOSIS — Z886 Allergy status to analgesic agent status: Secondary | ICD-10-CM

## 2016-01-26 DIAGNOSIS — R278 Other lack of coordination: Secondary | ICD-10-CM | POA: Diagnosis not present

## 2016-01-26 DIAGNOSIS — I509 Heart failure, unspecified: Secondary | ICD-10-CM | POA: Diagnosis present

## 2016-01-26 DIAGNOSIS — R2689 Other abnormalities of gait and mobility: Secondary | ICD-10-CM | POA: Diagnosis not present

## 2016-01-26 DIAGNOSIS — Z9221 Personal history of antineoplastic chemotherapy: Secondary | ICD-10-CM | POA: Diagnosis not present

## 2016-01-26 DIAGNOSIS — I5023 Acute on chronic systolic (congestive) heart failure: Secondary | ICD-10-CM | POA: Diagnosis not present

## 2016-01-26 DIAGNOSIS — R0602 Shortness of breath: Secondary | ICD-10-CM | POA: Diagnosis not present

## 2016-01-26 DIAGNOSIS — Z5181 Encounter for therapeutic drug level monitoring: Secondary | ICD-10-CM | POA: Diagnosis not present

## 2016-01-26 HISTORY — DX: Chronic obstructive pulmonary disease, unspecified: J44.9

## 2016-01-26 HISTORY — DX: Acute kidney failure, unspecified: N17.9

## 2016-01-26 LAB — TSH: TSH: 0.592 u[IU]/mL (ref 0.350–4.500)

## 2016-01-26 LAB — COMPREHENSIVE METABOLIC PANEL
ALBUMIN: 3.1 g/dL — AB (ref 3.5–5.0)
ALT: 29 U/L (ref 14–54)
ANION GAP: 10 (ref 5–15)
AST: 33 U/L (ref 15–41)
Alkaline Phosphatase: 71 U/L (ref 38–126)
BILIRUBIN TOTAL: 1.7 mg/dL — AB (ref 0.3–1.2)
BUN: 36 mg/dL — ABNORMAL HIGH (ref 6–20)
CO2: 33 mmol/L — ABNORMAL HIGH (ref 22–32)
Calcium: 9.3 mg/dL (ref 8.9–10.3)
Chloride: 94 mmol/L — ABNORMAL LOW (ref 101–111)
Creatinine, Ser: 2.08 mg/dL — ABNORMAL HIGH (ref 0.44–1.00)
GFR calc non Af Amer: 21 mL/min — ABNORMAL LOW (ref >60)
GFR, EST AFRICAN AMERICAN: 24 mL/min — AB (ref >60)
GLUCOSE: 99 mg/dL (ref 65–99)
POTASSIUM: 3.4 mmol/L — AB (ref 3.5–5.1)
Sodium: 137 mmol/L (ref 135–145)
TOTAL PROTEIN: 5.9 g/dL — AB (ref 6.5–8.1)

## 2016-01-26 LAB — CBC WITH DIFFERENTIAL/PLATELET
BASOS PCT: 0 %
Basophils Absolute: 0 10*3/uL (ref 0.0–0.1)
EOS ABS: 0 10*3/uL (ref 0.0–0.7)
Eosinophils Relative: 1 %
HEMATOCRIT: 34.2 % — AB (ref 36.0–46.0)
Hemoglobin: 10.1 g/dL — ABNORMAL LOW (ref 12.0–15.0)
Lymphocytes Relative: 17 %
Lymphs Abs: 1.1 10*3/uL (ref 0.7–4.0)
MCH: 25.3 pg — ABNORMAL LOW (ref 26.0–34.0)
MCHC: 29.5 g/dL — AB (ref 30.0–36.0)
MCV: 85.7 fL (ref 78.0–100.0)
MONO ABS: 0.7 10*3/uL (ref 0.1–1.0)
Monocytes Relative: 12 %
Neutro Abs: 4.3 10*3/uL (ref 1.7–7.7)
Neutrophils Relative %: 70 %
Platelets: 318 10*3/uL (ref 150–400)
RBC: 3.99 MIL/uL (ref 3.87–5.11)
RDW: 19.9 % — AB (ref 11.5–15.5)
WBC: 6.1 10*3/uL (ref 4.0–10.5)

## 2016-01-26 LAB — BRAIN NATRIURETIC PEPTIDE: B NATRIURETIC PEPTIDE 5: 973.7 pg/mL — AB (ref 0.0–100.0)

## 2016-01-26 LAB — MAGNESIUM: MAGNESIUM: 2.2 mg/dL (ref 1.7–2.4)

## 2016-01-26 MED ORDER — ACETAMINOPHEN 325 MG PO TABS
650.0000 mg | ORAL_TABLET | ORAL | Status: DC | PRN
  Administered 2016-01-29: 650 mg via ORAL
  Filled 2016-01-26: qty 2

## 2016-01-26 MED ORDER — ALPRAZOLAM 0.5 MG PO TABS
0.5000 mg | ORAL_TABLET | Freq: Two times a day (BID) | ORAL | Status: DC | PRN
  Administered 2016-01-26 – 2016-02-06 (×15): 0.5 mg via ORAL
  Filled 2016-01-26 (×15): qty 1

## 2016-01-26 MED ORDER — SODIUM CHLORIDE 0.9 % IV SOLN: 250.0000 mL | INTRAVENOUS | Status: DC | PRN

## 2016-01-26 MED ORDER — ONDANSETRON HCL 4 MG/2ML IJ SOLN: 4.0000 mg | Freq: Four times a day (QID) | INTRAMUSCULAR | Status: DC | PRN

## 2016-01-26 MED ORDER — SODIUM CHLORIDE 0.9% FLUSH
3.0000 mL | Freq: Two times a day (BID) | INTRAVENOUS | Status: DC
  Administered 2016-01-26 – 2016-02-07 (×24): 3 mL via INTRAVENOUS

## 2016-01-26 MED ORDER — APIXABAN 2.5 MG PO TABS
2.5000 mg | ORAL_TABLET | Freq: Two times a day (BID) | ORAL | Status: DC
Start: 2016-01-26 — End: 2016-02-07
  Administered 2016-01-26 – 2016-02-07 (×24): 2.5 mg via ORAL
  Filled 2016-01-26 (×24): qty 1

## 2016-01-26 MED ORDER — SODIUM CHLORIDE 0.9% FLUSH: 3.0000 mL | INTRAVENOUS | Status: DC | PRN

## 2016-01-26 MED ORDER — FUROSEMIDE 10 MG/ML IJ SOLN
80.0000 mg | Freq: Two times a day (BID) | INTRAMUSCULAR | Status: DC
  Administered 2016-01-26 – 2016-01-28 (×4): 80 mg via INTRAVENOUS
  Filled 2016-01-26 (×4): qty 8

## 2016-01-26 MED ORDER — AMIODARONE HCL 200 MG PO TABS
200.0000 mg | ORAL_TABLET | Freq: Two times a day (BID) | ORAL | Status: DC
  Administered 2016-01-26 – 2016-01-31 (×10): 200 mg via ORAL
  Filled 2016-01-26 (×10): qty 1
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
## 2016-01-26 NOTE — H&P (Signed)
Advanced Heart Failure Clinic Note   Referring Physician: Dr Johnsie Cancel Primary Care: Arlana Lindau, MD Primary Cardiologist: Dr Johnsie Cancel Primary HF: Dr. Haroldine Laws HPI: Mia Contreras is a 82 y.o. female with a history of nonischemic cardiomyopathy, systolic CHF, PACs, L Breast cancer in 2000 with surgery and chemo.  She has never had a cath. Had 4 rounds of chemo for breast CA. Doesn't remember which agents she got. Does not recall a red medicine.   Admitted 4/25 -12/19/15 with A/C combined CHF and AKI. Creatinine improved. She diuresed 12 L and down 19 lbs from highest weight that admission. Discharge weight 134 lbs.   She presents today for follow up. Over the last few days she has had 2 falls. Very weak. C/o SOB with exertion. + edema. Has LE wound and legs just wrapped at [place]. Denies PND/Orthopnea. Weight at home 127 pounds.[facility] following. She is followed at the [facility] for R and LLE . Taking meds. Has a caregiver to take her to appointments. Lives alone. Refused home health.   11/2015 ECHO EF 45-50%  12/16/14 ECHO Normal LV size with EF 30-35%, diffuse hypokinesis. Normal RVsize with mildly decreased systolic function. Moderate to severeLAE. Mild MR. Moderate pulmonary hypertension.  Recent labs: 12/01/15: Sodium 140 K 5.0 creatinine 2.5 (was 1.3 in 3/17). Albumin 3.8 BNP 1090 12/19/15: K 4.0, Creatinine 1.67 12/28/15: K 5.4, Creatinine 2.26 12/31/15 K 4.8, creatinine 2.49 01/07/2016 K 4.9 Creatinine 2.93   FH: Brother also has CAD and afib, Mother had CABG but did well, Father passed away from MI at age 65 thought to be 2/2 to heavy ETOH use.  SH: Pt has a distant history of smoking. Pt has 50-60 pack years up to 2ppd. Quit in 1982.   Past Medical History  Diagnosis Date  . GOITER, MULTINODULAR 05/06/2010    Dr [name]  . HYPERTHYROIDISM 05/23/2010  . HYPERLIPIDEMIA 08/20/2007  . ANEMIA-NOS 08/20/2007  . ANXIETY 03/16/2007  . CARPAL TUNNEL  SYNDROME, RIGHT 01/11/2008  . HYPERTENSION 03/16/2007  . VENOUS INSUFFICIENCY 09/05/2007  . DIVERTICULOSIS, COLON 08/20/2007  . CELLULITIS, LEG, RIGHT 08/20/2007  . OSTEOARTHRITIS 03/16/2007  . OSTEOPOROSIS 08/20/2007  . SCOLIOSIS 01/11/2008  . BREAST CANCER, HX OF 03/16/2007  . SKIN CANCER, HX OF 11/12/2008    R cheek 2010, L Cheek 2011 Dr. Tonia Brooms  . PREMATURE ATRIAL CONTRACTIONS 09/21/2010  . Thyroid nodule   . Cataract     floaters  . Pneumonia 07/2011    took abx for several weeks  . IBS (irritable bowel syndrome)   . Collagenous colitis     Current Outpatient Prescriptions  Medication Sig Dispense Refill  . ALPRAZolam (XANAX) 0.5 MG tablet Take 1 tablet (0.5 mg total) by mouth 2 (two) times daily as needed for anxiety or sleep. 60 tablet 3  . amiodarone (PACERONE) 200 MG tablet Take 1 tablet (200 mg total) by mouth 2 (two) times daily. 60 tablet 0  . apixaban (ELIQUIS) 2.5 MG TABS tablet Take 1 tablet (2.5 mg total) by mouth 2 (two) times daily. 180 tablet 3  . Calcium Carbonate-Vitamin D (CALCIUM 600+D) 600-400 MG-UNIT per tablet Take 1 tablet by mouth daily.    . Fluticasone-Salmeterol (ADVAIR DISKUS) 250-50 MCG/DOSE AEPB Inhale 1 puff into the lungs 2 (two) times daily. 60 each 3  . Multiple Vitamins-Minerals (MULTIVITAMIN PO) Take 1 tablet by mouth daily.     Marland Kitchen torsemide (DEMADEX) 20 MG tablet Take 20-40 mg by mouth daily. Take 2 tablets (40mg ) every other  day, alternating with 1 tablet (20mg ) every other day     No current facility-administered medications for this encounter.    Allergies  Allergen Reactions  . Cefuroxime Diarrhea  . Celecoxib Nausea Only  . Deltasone [Prednisone]     Oral deltasone is causing swelling (can use Depo-Medrol)  . Ranitidine     bloating  . Tramadol Hcl Nausea Only     Social History   Social History  .  Marital Status: Single    Spouse Name: N/A  . Number of Children: N/A  . Years of Education: N/A   Occupational History  . Not on file.   Social History Main Topics  . Smoking status: Former Smoker    Quit date: 12/20/1980  . Smokeless tobacco: Never Used  . Alcohol Use: Yes     Comment: socially  . Drug Use: No  . Sexual Activity: Not Currently   Other Topics Concern  . Not on file   Social History Narrative     Family History  Problem Relation Age of Onset  . Dementia Mother   . Heart disease Mother   . Mental retardation Mother   . Colon cancer Neg Hx   . Hypertension Mother   . Alzheimer's disease Mother   . Prostate cancer Brother   . Diabetes Other   . Heart attack Father   . Alcohol abuse Father   . Anesthesia problems Neg Hx     Filed Vitals:   01/26/16 1155  BP: 96/64  Pulse: 111  Weight: 129 lb 9.6 oz (58.786 kg)  SpO2: 81%   Wt Readings from Last 3 Encounters:  01/26/16 129 lb 9.6 oz (58.786 kg)  01/24/16 129 lb 12.8 oz (58.877 kg)  01/11/16 130 lb 12.8 oz (59.33 kg)   Results for orders placed or performed during the hospital encounter of 01/26/16 (from the past 24 hour(s))  CBC WITH DIFFERENTIAL     Status: Abnormal   Collection Time: 01/26/16  1:57 PM  Result Value Ref Range   WBC 6.1 4.0 - 10.5 K/uL   RBC 3.99 3.87 - 5.11 MIL/uL   Hemoglobin 10.1 (L) 12.0 - 15.0 g/dL   HCT 34.2 (L) 36.0 - 46.0 %   MCV 85.7 78.0 - 100.0 fL   MCH 25.3 (L) 26.0 - 34.0 pg   MCHC 29.5 (L) 30.0 - 36.0 g/dL   RDW 19.9 (H) 11.5 - 15.5 %   Platelets 318 150 - 400 K/uL   Neutrophils Relative % 70 %   Neutro Abs 4.3 1.7 - 7.7 K/uL   Lymphocytes Relative 17 %   Lymphs Abs 1.1 0.7 - 4.0 K/uL   Monocytes Relative 12 %   Monocytes Absolute 0.7 0.1 - 1.0 K/uL   Eosinophils Relative 1 %   Eosinophils Absolute 0.0 0.0 - 0.7 K/uL   Basophils  Relative 0 %   Basophils Absolute 0.0 0.0 - 0.1 K/uL  Comprehensive metabolic panel     Status: Abnormal   Collection Time: 01/26/16  1:57 PM  Result Value Ref Range   Sodium 137 135 - 145 mmol/L   Potassium 3.4 (L) 3.5 - 5.1 mmol/L   Chloride 94 (L) 101 - 111 mmol/L   CO2 33 (H) 22 - 32 mmol/L   Glucose, Bld 99 65 - 99 mg/dL   BUN 36 (H) 6 - 20 mg/dL   Creatinine, Ser 2.08 (H) 0.44 - 1.00 mg/dL   Calcium 9.3 8.9 - 10.3 mg/dL   Total Protein 5.9 (  L) 6.5 - 8.1 g/dL   Albumin 3.1 (L) 3.5 - 5.0 g/dL   AST 33 15 - 41 U/L   ALT 29 14 - 54 U/L   Alkaline Phosphatase 71 38 - 126 U/L   Total Bilirubin 1.7 (H) 0.3 - 1.2 mg/dL   GFR calc non Af Amer 21 (L) >60 mL/min   GFR calc Af Amer 24 (L) >60 mL/min   Anion gap 10 5 - 15  Magnesium     Status: None   Collection Time: 01/26/16  1:57 PM  Result Value Ref Range   Magnesium 2.2 1.7 - 2.4 mg/dL  Brain natriuretic peptide     Status: Abnormal   Collection Time: 01/26/16  1:57 PM  Result Value Ref Range   B Natriuretic Peptide 973.7 (H) 0.0 - 100.0 pg/mL  TSH     Status: None   Collection Time: 01/26/16  1:57 PM  Result Value Ref Range   TSH 0.592 0.350 - 4.500 uIU/mL     PHYSICAL EXAM: General: Elderly. Sitting in [place]. NAD. Sitter present. Marland Kitchen  HEENT: normal Neck: supple. JVP To jaw Carotids 2+ bilat; no bruits. No thyromegaly or nodule noted.  Cor: PMI nondisplaced. Irregular rate & rhythm. No rubs, gallops or murmurs. Lungs: CTAB, normal effort.  Abdomen: soft, NT, ND, no HSM. No bruits or masses. +BS  Extremities: no cyanosis, clubbing, rash. R and L thighs with 3+ lower extremity edema. R and LLE wrapped with coban. Above wrap she has 2-3+ edema intothighs  Neuro: alert & oriented x 3, cranial nerves grossly intact. moves all 4 extremities w/o difficulty. Affect pleasant.  ASSESSMENT & PLAN: 1. Acute/Chronic systolic HF with R>>L symptoms. ECHO 11/2015 EF 45-50%.  NYHA III. Volume status elevated. Admit for IV diuresis.   2. AKI/CKD- Creatinine trending up. Suspect will improve with IV diuresis 3. LE wounds- Followed at the wound center. Ask woundcare to see in the hospital.  4. H/o COPD 5. H/o breast cancer s/p chemo 2000 6. Chronic A fib- Rate controlled. On Eliquis twice a day.  This patients CHA2DS2-VASc Score and unadjusted Ischemic Stroke Rate (% per year) is equal to 7.2 % stroke rate/year from a score of 5  Above score calculated as 1 point each if present [CHF, HTN, DM, Vascular=MI/PAD/Aortic Plaque, Age if 65-74, or Female] Above score calculated as 2 points each if present [Age > 75, or Stroke/TIA/TE]   7. Falls- 2 falls this week. Live alone. Not safe to return home. Consult PT. 8. Acute respiratory failure with Hypoxia- O2 sat on arrival to clininc was 81 %. Placed on 2 liters with O2 sat up to 90%.   Dr Haroldine Laws evaluated with me and agree with hospital admit. Will also need SW for SNF placement.   Darrick Grinder, NP-C  01/26/2016   Patient seen and examined with Darrick Grinder, NP. We discussed all aspects of the encounter. I agree with the assessment and plan as stated above.   She is markedly volume overloaded with recurrent LE wounds.Renal function slightly worse. Hopefully will improve with diuresis. Also very debilitated and unsteady at home with 2 recent falls. Will admit for IV diuresis. Will likely need long-term placement post d/c as she is unable to live alone. Consult wound care. Continue Eliquis for PAF.   Bensimhon, Daniel,MD 7:25 PM

## 2016-01-26 NOTE — Progress Notes (Signed)
Advanced Heart Failure Medication Review by a Pharmacist  Does the patient  feel that his/her medications are working for him/her?  yes  Has the patient been experiencing any side effects to the medications prescribed?  no  Does the patient measure his/her own blood pressure or blood glucose at home?  no   Does the patient have any problems obtaining medications due to transportation or finances?   no  Understanding of regimen: fair Understanding of indications: fair Potential of compliance: fair Patient understands to avoid NSAIDs. Patient understands to avoid decongestants.   Pharmacist comments: Ms. Hogenmiller  Is a pleasant 79 yof presenting to clinic for a follow-up appointment. She states that she is not currently experiencing any medication-related side effects and did not have any medication questions at this visit. She brought her list of medications with her and states that she almost never misses a dose.    Time with patient: 10 min  Preparation and documentation time: 2 min  Total time: *12 min   Gregery Walberg C. Lennox Grumbles, PharmD Pharmacy Resident  Pager: [phone] 01/26/2016 12:19 PM

## 2016-01-26 NOTE — Progress Notes (Signed)
Patient ID: Mia Contreras, female   DOB: 1929/02/02, 82 y.o.   MRN: [id number]    Advanced Heart Failure Clinic Note   Referring Physician: Dr Johnsie Cancel Primary Care: Arlana Lindau, MD Primary Cardiologist: Dr Johnsie Cancel Primary HF: Dr. Haroldine Laws HPI: Mia Contreras is a 82 y.o. female with a history of nonischemic cardiomyopathy, systolic CHF, PACs, L Breast cancer in 2000 with surgery and chemo.  She has never had a cath. Had 4 rounds of chemo for breast CA. Doesn't remember which agents she got. Does not recall a red medicine.   Admitted 4/25 -12/19/15 with A/C combined CHF and AKI. Creatinine improved.  She diuresed 12 L and down 19 lbs from highest weight that admission. Discharge weight 134 lbs.   She presents today for follow up. Over the last few days she has had 2 falls SOB with exertion. Denies SOB/PND/Orthopnea. Weight at home 127 pounds.[facility] following. She is followed at the [facility] for  R and LLE . Taking meds. Has a caregiver to take her to appointments. Lives alone. Refused home health.   11/2015 ECHO EF 45-50%  12/16/14  ECHO Normal LV size with EF 30-35%, diffuse hypokinesis. Normal RVsize with mildly decreased systolic function. Moderate to severeLAE. Mild MR. Moderate pulmonary hypertension.  Recent labs: 12/01/15: Sodium 140 K 5.0 creatinine 2.5 (was 1.3 in 3/17). Albumin 3.8 BNP 1090 12/19/15: K 4.0, Creatinine 1.67 12/28/15: K 5.4, Creatinine 2.26 12/31/15 K 4.8, creatinine 2.49 01/07/2016 K 4.9 Creatinine 2.93   FH: Brother also has CAD and afib, Mother had CABG but did well, Father passed away from MI at age 68 thought to be 2/2 to heavy ETOH use.  SH:  Pt has a distant history of smoking. Pt has 50-60 pack years up to 2ppd. Quit in 1982.   Past Medical History  Diagnosis Date  . GOITER, MULTINODULAR 05/06/2010    Dr [name]  . HYPERTHYROIDISM 05/23/2010  . HYPERLIPIDEMIA 08/20/2007  . ANEMIA-NOS 08/20/2007  . ANXIETY 03/16/2007  . CARPAL TUNNEL SYNDROME, RIGHT  01/11/2008  . HYPERTENSION 03/16/2007  . VENOUS INSUFFICIENCY 09/05/2007  . DIVERTICULOSIS, COLON 08/20/2007  . CELLULITIS, LEG, RIGHT 08/20/2007  . OSTEOARTHRITIS 03/16/2007  . OSTEOPOROSIS 08/20/2007  . SCOLIOSIS 01/11/2008  . BREAST CANCER, HX OF 03/16/2007  . SKIN CANCER, HX OF 11/12/2008     R cheek 2010, L Cheek 2011 Dr. Tonia Brooms  . PREMATURE ATRIAL CONTRACTIONS 09/21/2010  . Thyroid nodule   . Cataract     floaters  . Pneumonia 07/2011    took abx for several weeks  . IBS (irritable bowel syndrome)   . Collagenous colitis     Current Outpatient Prescriptions  Medication Sig Dispense Refill  . ALPRAZolam (XANAX) 0.5 MG tablet Take 1 tablet (0.5 mg total) by mouth 2 (two) times daily as needed for anxiety or sleep. 60 tablet 3  . amiodarone (PACERONE) 200 MG tablet Take 1 tablet (200 mg total) by mouth 2 (two) times daily. 60 tablet 0  . apixaban (ELIQUIS) 2.5 MG TABS tablet Take 1 tablet (2.5 mg total) by mouth 2 (two) times daily. 180 tablet 3  . Calcium Carbonate-Vitamin D (CALCIUM 600+D) 600-400 MG-UNIT per tablet Take 1 tablet by mouth daily.    . Fluticasone-Salmeterol (ADVAIR DISKUS) 250-50 MCG/DOSE AEPB Inhale 1 puff into the lungs 2 (two) times daily. 60 each 3  . Multiple Vitamins-Minerals (MULTIVITAMIN PO) Take 1 tablet by mouth daily.     Marland Kitchen torsemide (DEMADEX) 20 MG tablet Take 20-40 mg  by mouth daily. Take 2 tablets (40mg ) every other day, alternating with 1 tablet (20mg ) every other day     No current facility-administered medications for this encounter.    Allergies  Allergen Reactions  . Cefuroxime Diarrhea  . Celecoxib Nausea Only  . Deltasone [Prednisone]     Oral deltasone is causing swelling (can use Depo-Medrol)  . Ranitidine     bloating  . Tramadol Hcl Nausea Only      Social History   Social History  . Marital Status: Single    Spouse Name: N/A  . Number of Children: N/A  . Years of Education: N/A   Occupational History  . Not on file.    Social History Main Topics  . Smoking status: Former Smoker    Quit date: 12/20/1980  . Smokeless tobacco: Never Used  . Alcohol Use: Yes     Comment: socially  . Drug Use: No  . Sexual Activity: Not Currently   Other Topics Concern  . Not on file   Social History Narrative      Family History  Problem Relation Age of Onset  . Dementia Mother   . Heart disease Mother   . Mental retardation Mother   . Colon cancer Neg Hx   . Hypertension Mother   . Alzheimer's disease Mother   . Prostate cancer Brother   . Diabetes Other   . Heart attack Father   . Alcohol abuse Father   . Anesthesia problems Neg Hx     Filed Vitals:   01/26/16 1155  BP: 96/64  Pulse: 111  Weight: 129 lb 9.6 oz (58.786 kg)  SpO2: 81%   Wt Readings from Last 3 Encounters:  01/26/16 129 lb 9.6 oz (58.786 kg)  01/24/16 129 lb 12.8 oz (58.877 kg)  01/11/16 130 lb 12.8 oz (59.33 kg)    PHYSICAL EXAM: General:  Elderly. Sitting in [place]. NAD. Sitter present. Marland Kitchen  HEENT: normal Neck: supple. JVP  To jaw  Carotids 2+ bilat; no bruits. No thyromegaly or nodule noted.  Cor: PMI nondisplaced. Irregular rate & rhythm. No rubs, gallops or murmurs. Lungs: CTAB, normal effort.  Abdomen: soft, NT, ND, no HSM. No bruits or masses. +BS  Extremities: no cyanosis, clubbing, rash. R and L thighs with 3+ lower extremity edema.  R and LLE wrapped with coban. Above wrap she has 2+ edema.  Neuro: alert & oriented x 3, cranial nerves grossly intact. moves all 4 extremities w/o difficulty. Affect pleasant.  ASSESSMENT & PLAN: 1. Acute/Chronic systolic HF with R>>L symptoms. ECHO 11/2015 EF 45-50%.  NYHA III. Volume status elevated. Admit for IV diuresis.  2. AKI/CKD- Follow renal functionreatinine trending up. Increasing diuretics as above.   3. LE wounds- Followed at the wound center. Ask wound to see in the hospital.  4.  H/o COPD 5. H/o breast cancer s/p chemo 2000 6. Chronic A fib- Rate controlled. On Eliquis twice  a day.  7. Falls- 2 falls this week. Live alone. Not safe to return home. Consult PT. 8. Hypoxia- O2 sat on arrival to [facility]inc was 81 %. Placed on 2 liters with O2 sat up to 90%.    Dr Haroldine Laws evaluated with me and agree with hospital admit. Will also need SW for SNF placement.   Darrick Grinder, NP-C  01/26/2016

## 2016-01-26 NOTE — Clinical Social Work Note (Signed)
CSW acknowledges consult for SNF. Will wait for PT recommendations.  Dayton Scrape, [place]

## 2016-01-26 NOTE — Consult Note (Signed)
WOC wound consult note Reason for Consult: bilateral LE venous dermatitis Patient reports she has been followed in the wound care center and had compression wraps in place, she has Juxta Light compression wraps at home but she "does not recommend them". Palpable pulses bilaterally, 2+ edema. Wound type: venous ulcerations LLE pretibial region and medial malleolar Measurement: mostly scabbed on the pretibial region of the LLE, one open area left medial 1.0cm x 2.5cm x 0.2cm  Wound bed: clean, pink, moist Drainage (amount, consistency, odor) minimal Periwound: venous dermatitis  Dressing procedure/placement/frequency: Topical care only with silicone non adherent per the bedside nurse. Orthopedic tech to place Unna's boots bilaterally.  Unna's boots to be changed Q Wednesday, will need FU appointment in Wound care center for compression wrap changes.   Discussed POC with patient and bedside nurse.  Re consult if needed, will not follow at this time. Thanks  Jadalee Westcott [place], [place] [phone])

## 2016-01-27 ENCOUNTER — Encounter (HOSPITAL_COMMUNITY): Payer: Self-pay | Admitting: General Practice

## 2016-01-27 ENCOUNTER — Inpatient Hospital Stay (HOSPITAL_COMMUNITY): Payer: Medicare Other

## 2016-01-27 ENCOUNTER — Telehealth: Payer: Self-pay

## 2016-01-27 DIAGNOSIS — I482 Chronic atrial fibrillation: Secondary | ICD-10-CM

## 2016-01-27 DIAGNOSIS — E440 Moderate protein-calorie malnutrition: Secondary | ICD-10-CM | POA: Diagnosis present

## 2016-01-27 LAB — BASIC METABOLIC PANEL
ANION GAP: 7 (ref 5–15)
BUN: 32 mg/dL — AB (ref 6–20)
CHLORIDE: 94 mmol/L — AB (ref 101–111)
CO2: 37 mmol/L — AB (ref 22–32)
Calcium: 8.6 mg/dL — ABNORMAL LOW (ref 8.9–10.3)
Creatinine, Ser: 1.85 mg/dL — ABNORMAL HIGH (ref 0.44–1.00)
GFR calc Af Amer: 28 mL/min — ABNORMAL LOW (ref >60)
GFR, EST NON AFRICAN AMERICAN: 24 mL/min — AB (ref >60)
GLUCOSE: 87 mg/dL (ref 65–99)
POTASSIUM: 2.8 mmol/L — AB (ref 3.5–5.1)
Sodium: 138 mmol/L (ref 135–145)

## 2016-01-27 MED ORDER — ADULT MULTIVITAMIN W/MINERALS CH
1.0000 | ORAL_TABLET | Freq: Every day | ORAL | Status: DC
  Administered 2016-01-27 – 2016-02-07 (×12): 1 via ORAL
  Filled 2016-01-27 (×12): qty 1

## 2016-01-27 MED ORDER — ENSURE ENLIVE PO LIQD
237.0000 mL | Freq: Three times a day (TID) | ORAL | Status: DC
  Administered 2016-01-27 – 2016-02-01 (×11): 237 mL via ORAL

## 2016-01-27 MED ORDER — POTASSIUM CHLORIDE CRYS ER 20 MEQ PO TBCR
40.0000 meq | EXTENDED_RELEASE_TABLET | Freq: Two times a day (BID) | ORAL | Status: DC
  Administered 2016-01-27 – 2016-01-29 (×6): 40 meq via ORAL
  Filled 2016-01-27 (×6): qty 2

## 2016-01-27 MED ORDER — POTASSIUM CHLORIDE CRYS ER 20 MEQ PO TBCR
40.0000 meq | EXTENDED_RELEASE_TABLET | Freq: Once | ORAL | Status: AC
  Administered 2016-01-27: 40 meq via ORAL

## 2016-01-27 NOTE — Progress Notes (Addendum)
Advanced Heart Failure Rounding Note   Subjective:     Diuresing well. Breathing is better. Denies orthopnea.    Objective:   Weight Range:  Vital Signs:   Temp:  [98 F (36.7 C)-98.2 F (36.8 C)] 98.1 F (36.7 C) (06/08 0005) Pulse Rate:  [78-111] 106 (06/08 0759) Resp:  [14-18] 14 (06/07 1946) BP: (81-122)/(50-76) 110/69 mmHg (06/08 0759) SpO2:  [81 %-100 %] 96 % (06/08 0759) Weight:  [58.242 kg (128 lb 6.4 oz)-58.786 kg (129 lb 9.6 oz)] 58.242 kg (128 lb 6.4 oz) (06/07 1337) Last BM Date: 01/25/16  Weight change: Filed Weights   01/26/16 1337  Weight: 58.242 kg (128 lb 6.4 oz)    Intake/Output:   Intake/Output Summary (Last 24 hours) at 01/27/16 1000 Last data filed at 01/27/16 0921  Gross per 24 hour  Intake    760 ml  Output   2500 ml  Net  -1740 ml     PHYSICAL EXAM: General: Elderly. Sitting on side of bed. NAD.  HEENT: normal Neck: supple. JVP9  Carotids 2+ bilat; no bruits. No thyromegaly or nodule noted.  Cor: PMI nondisplaced. Irregular rate & rhythm. No rubs, gallops or murmurs. Lungs: CTAB, normal effort.  Abdomen: soft, NT, ND, no HSM. No bruits or masses. +BS  Extremities: no cyanosis, clubbing, rash. R and L thighs with 1-2+ lower extremity edema. R and LLE wrapped with coban. Above wrap she has 1-2+ edema into thighs  Neuro: alert & oriented x 3, cranial nerves grossly intact. moves all 4 extremities w/o difficulty. Affect pleasant.  TELE: Chronic AF 100-110  Labs: Basic Metabolic Panel:  Recent Labs Lab 01/26/16 1357 01/27/16 0430  NA 137 138  K 3.4* 2.8*  CL 94* 94*  CO2 33* 37*  GLUCOSE 99 87  BUN 36* 32*  CREATININE 2.08* 1.85*  CALCIUM 9.3 8.6*  MG 2.2  --     Liver Function Tests:  Recent Labs Lab 01/26/16 1357  AST 33  ALT 29  ALKPHOS 71  BILITOT 1.7*  PROT 5.9*  ALBUMIN 3.1*   No results for input(s): LIPASE, AMYLASE in the last 168 hours. No results for input(s): AMMONIA in the last 168  hours.  CBC:  Recent Labs Lab 01/26/16 1357  WBC 6.1  NEUTROABS 4.3  HGB 10.1*  HCT 34.2*  MCV 85.7  PLT 318    Cardiac Enzymes: No results for input(s): CKTOTAL, CKMB, CKMBINDEX, TROPONINI in the last 168 hours.  BNP: BNP (last 3 results)  Recent Labs  12/14/15 1337 01/26/16 1357  BNP 1357.2* 973.7*    ProBNP (last 3 results)  Recent Labs  12/01/15 1052  PROBNP 1090.0*      Other results:  Imaging: Dg Chest 2 View  01/27/2016  CLINICAL DATA:  Shortness of breath, weakness EXAM: CHEST  2 VIEW COMPARISON:  12/15/2015 FINDINGS: Cardiomediastinal silhouette is stable. There is dextroscoliosis lower thoracic spine. Significant levoscoliosis lumbar spine. Mild hyperinflation. Mild emphysematous changes and chronic interstitial prominence. Persistent left basilar atelectasis. Surgical clips are noted in left axilla. Again noted large left retrocardiac diaphragmatic/ hiatal hernia. No segmental infiltrate or pulmonary edema. IMPRESSION: Mild hyperinflation. Mild emphysematous changes and chronic interstitial prominence. Persistent left basilar atelectasis. Surgical clips are noted in left axilla. Again noted large left retrocardiac diaphragmatic/ hiatal hernia. No segmental infiltrate or pulmonary edema. Electronically Signed   By: Lahoma Crocker M.D.   On: 01/27/2016 08:30      Medications:     Scheduled Medications: .  amiodarone  200 mg Oral BID  . apixaban  2.5 mg Oral BID  . furosemide  80 mg Intravenous BID  . potassium chloride  40 mEq Oral BID  . potassium chloride  40 mEq Oral Once  . sodium chloride flush  3 mL Intravenous Q12H     Infusions:     PRN Medications:  sodium chloride, acetaminophen, ALPRAZolam, ondansetron (ZOFRAN) IV, sodium chloride flush   Assessment:   1. Acute/Chronic systolic HF with R>>L symptoms. ECHO 11/2015 EF 45-50%.  NYHA III. Volume status elevated. Admit for IV diuresis.  2. AKI/CKD- Creatinine trending up. Suspect will  improve with IV diuresis 3. LE wounds- Followed at the wound center. Ask woundcare to see in the hospital.  4. H/o COPD 5. H/o breast cancer s/p chemo 2000 6. Chronic A fib- Rate controlled. On Eliquis twice a day.  This patients CHA2DS2-VASc Score and unadjusted Ischemic Stroke Rate (% per year) is equal to 7.2 % stroke rate/year from a score of 5 Above score calculated as 1 point each if present [CHF, HTN, DM, Vascular=MI/PAD/Aortic Plaque, Age if 65-74, or Female] Above score calculated as 2 points each if present [Age > 75, or Stroke/TIA/TE] 7. Falls- 2 falls this week. Live alone. Not safe to return home. Consult PT. 8. Acute respiratory failure with Hypoxia- O2 sat on arrival to clininc was 81 %. Placed on 2 liters with O2 sat up to 90%.   Plan/Discussion:    Much improved with diuresis. Suspect 1-2 more days of IV diuresis. Once euvolemic will need placement at [facility]. Possibly Saturday? I suspect she may have component of mild dementia.   Continue anticoagulation.   Jagdeep Ancheta,MD 10:02 AM Length of Stay: 1  Advanced Heart Failure Team Pager [phone] (M-F; 7a - 4p)  Please contact [facility] for night-coverage after hours (4p -7a ) and weekends on [email or web address]

## 2016-01-27 NOTE — Consult Note (Addendum)
   [facility] Inpatient Consult   01/27/2016  Mia Contreras 07/20/1929 [id number]  Patient is currently active with [facility] for chronic disease management services.  Patient has been engaged by a Geologist, engineering and [facility] Pharmacist.  Our community based plan of care has focused on disease management and community resource support.  Patient will receive a post discharge transition of care call and will be evaluated for monthly home visits for assessments and disease process education. Patient was admitted with HF exacerbation.  Met with patient who desires ongoing follow up with [facility] services.  Patient endorses Dr. Tyrone Apple Plotnikov as her primary care provider.  She states that she have notice being short of breath more even though her weigh had not gone over 3 lbs but she would drive herself to the store and would have to rest before getting inside.  She states she drives to [facility] to get her medications at the drive thru.  Active consent on file.  Patient states she is in agreement to going for some rehab at [facility].  Explained [facility] could continue to follow for plans for her return to the community.  Will update her [facility].  Awaiting PT for disposition recommendations.  Chart review reveals patient is falling at home.  Had services with "[facility]".   Made Inpatient Case Manager aware that [facility] following at progress meeting today.. Of note, [facility] Care Management services does not replace or interfere with any services that are needed or arranged by inpatient case management or social work.  For additional questions or referrals please contact:  Natividad Brood, RN BSN [facility] Liaison  [phone] business mobile phone Toll free office [phone]  Correction

## 2016-01-27 NOTE — Progress Notes (Signed)
Pt's potassium is 2.8. Spoke with Erin Sons, PA and ordered potassium replacement.

## 2016-01-27 NOTE — Progress Notes (Signed)
Pt has been followed by the wound center, will need a wound consult for her BLE legs, thanks Arvella Nigh RN

## 2016-01-27 NOTE — Progress Notes (Signed)
Initial Nutrition Assessment  DOCUMENTATION CODES:   Non-severe (moderate) malnutrition in context of chronic illness  INTERVENTION:  Provide Ensure Enlive po TID, each supplement provides 350 kcal and 20 grams of protein Provide Multivitamin with minerals  Recommend providing 500 mg of Vitamin C daily for 2 weeks   NUTRITION DIAGNOSIS:   Inadequate oral intake related to poor appetite as evidenced by energy intake < or equal to 75% for > or equal to 1 month, moderate depletions of muscle mass, moderate depletion of body fat, percent weight loss.   GOAL:   Patient will meet greater than or equal to 90% of their needs   MONITOR:   PO intake, Supplement acceptance, Labs, Skin, I & O's, Weight trends  REASON FOR ASSESSMENT:   Malnutrition Screening Tool    ASSESSMENT:   82 y.o. female with a history of nonischemic cardiomyopathy, systolic CHF, PACs, L Breast cancer in 2000 with surgery and chemo. Over the last few days she has had 2 falls. Very weak. C/o SOB with exertion. + edema. Has LE wound and legs just wrapped at [place].  Pt states that since being discharged at the end of April, she has had a poor appetite and has been eating 50% less than usual she reports eating half a piece of cheese toast, fruits, half a chicken thigh, and a serving of rice daily. She started drinking Boost supplements twice daily at home and has been tolerating well. She has non healing wounds on her lower extremities. She has felt very weak recently. RD emphasized the importance of adequate nutrition intake for wound healing and to keep strength up. Pt agreeable to receiving Ensure during admission.  Labs: low potassium, low chloride, low calcium, low hemoglobin   Diet Order:  Diet 2 gram sodium Room service appropriate?: Yes; Fluid consistency:: Thin  Skin:  Reviewed, no issues  Last BM:  6/6  Height:   Ht Readings from Last 1 Encounters:  01/26/16 5\' 3"  (1.6 m)    Weight:   Wt  Readings from Last 1 Encounters:  01/26/16 128 lb 6.4 oz (58.242 kg)    Ideal Body Weight:  52.27 kg  BMI:  Body mass index is 22.75 kg/(m^2).  Estimated Nutritional Needs:   Kcal:  1400-1600  Protein:  75-90 grams  Fluid:  1.4-1.6 L/day  EDUCATION NEEDS:   No education needs identified at this time  [place], LDN Inpatient Clinical Dietitian Pager: [phone] After Hours Pager: [phone]

## 2016-01-27 NOTE — Evaluation (Signed)
Physical Therapy Evaluation Patient Details Name: Mia Contreras MRN: [id number] DOB: 1929/01/17 Today's Date: 01/27/2016   History of Present Illness  82 y.o. female admitted to [facility] on [facility]/26/16 for acute on chronic CHF.  Pt with significant PMhx of recurrent falls with resultant bil LE wounds, anemia, HTN, venous insufficiency, h/o skin and breast CA, premature atrial contractions, COPD, Bil TKA, and L partial mastecotmy.  Clinical Impression  Pt was able to get up and ambulate with me today with use of RW.  Her endurance is poor and her balance on her feet puts her at risk for falls (she has had 3 recently at home).  She would benefit from SNF for rehab before returning home where she lives alone.  Pt/family may want to also consider transition from SNF for rehab to ALF level of care as it doesn't sound like the pt is managing well by herself.  She may not be open to this idea and SNF will help address this at the end of therapy.  PT to follow acutely for deficits listed below.       Follow Up Recommendations SNF    Equipment Recommendations  None recommended by PT    Recommendations for Other Services   NA    Precautions / Restrictions Precautions Precautions: Fall Precaution Comments: pt with recent h/o recurrent falls at home      Mobility  Bed Mobility               General bed mobility comments: pt OOB in recliner chair  Transfers Overall transfer level: Needs assistance Equipment used: Rolling walker (2 wheeled) Transfers: Sit to/from Stand Sit to Stand: Min assist         General transfer comment: Min assist to steady pt for balance during transfers and to help control descent to sit.  Verbal cues for safe hand placement and RW use (pt likes to park the RW away from the chair and then back up to sit vs taking it with her).   Ambulation/Gait Ambulation/Gait assistance: Min assist Ambulation Distance (Feet): 60 Feet Assistive device: Rolling walker (2  wheeled) Gait Pattern/deviations:  (R circumducting gait pattern)     General Gait Details: Per pt report the circumducting gait pattern runs in her family and is her baseline, however the abnormal pattern puts her at increased risk of falls as well as bil LEs wrapped decreasing her ability to feel what is going on on the ground.  Min assist to support trunk for balance during gait.          Balance Overall balance assessment: Needs assistance Sitting-balance support: Feet supported;No upper extremity supported Sitting balance-Leahy Scale: Fair     Standing balance support: Bilateral upper extremity supported Standing balance-Leahy Scale: Poor                               Pertinent Vitals/Pain Pain Assessment: No/denies pain    Home Living Family/patient expects to be discharged to:: Private residence Living Arrangements: Alone Available Help at Discharge: Friend(s);Available PRN/intermittently Type of Home: House (townhome) Home Access: Stairs to enter Entrance Stairs-Rails: None Entrance Stairs-Number of Steps: 1 Home Layout: One level Home Equipment: Environmental consultant - 2 wheels;Walker - 4 wheels      Prior Function Level of Independence: Independent with assistive device(s)         Comments: Pt reports mod I with RW at home, drives short distances, neice checks on her  regularly by phone and stops by when she is driving throguh town.  Friends from church have been bringing food over.      Hand Dominance   Dominant Hand: Right    Extremity/Trunk Assessment   Upper Extremity Assessment: Generalized weakness           Lower Extremity Assessment: Generalized weakness (bil LEs wrapped due to wounds)      Cervical / Trunk Assessment: Kyphotic  Communication   Communication: No difficulties  Cognition Arousal/Alertness: Awake/alert Behavior During Therapy: WFL for tasks assessed/performed Overall Cognitive Status: Within Functional Limits for tasks  assessed (maybe some mild memory deficits)                               Assessment/Plan    PT Assessment Patient needs continued PT services  PT Diagnosis Difficulty walking;Abnormality of gait;Generalized weakness;Acute pain   PT Problem List Decreased strength;Decreased activity tolerance;Decreased balance;Decreased mobility;Decreased knowledge of use of DME;Pain  PT Treatment Interventions DME instruction;Gait training;Stair training;Functional mobility training;Therapeutic activities;Balance training;Therapeutic exercise;Neuromuscular re-education   PT Goals (Current goals can be found in the Care Plan section) Acute Rehab PT Goals Patient Stated Goal: to go to rehab, preferably at [facility] and then go back home PT Goal Formulation: With patient Time For Goal Achievement: 02/10/16 Potential to Achieve Goals: Good    Frequency Min 2X/week (would benefit from more)   Barriers to discharge Decreased caregiver support pt lives alone and has had multiple recent falls resulting in LE wounds       End of Session   Activity Tolerance: Patient limited by fatigue;Patient limited by pain Patient left: in chair;with call bell/phone within reach;with chair alarm set Nurse Communication: Mobility status         Time: [id number] PT Time Calculation (min) (ACUTE ONLY): 34 min   Charges:   PT Evaluation $PT Eval Moderate Complexity: 1 Procedure PT Treatments $Gait Training: 8-22 mins        Berta Denson B. [place], [place], DPT [phone]   01/27/2016, 5:03 PM

## 2016-01-27 NOTE — Telephone Encounter (Signed)
-----   Message from [name] sent at 01/24/2016  3:56 PM EDT ----- Regarding: 3 month f/u with [name].  Contact: [phone] Pt needs appt for 3 month f/u with Dr Johnsie Cancel.   Mia Contreras

## 2016-01-27 NOTE — Telephone Encounter (Signed)
Tried to call patient on phone numbers provided. No answer. Will schedule when patient calls back.

## 2016-01-28 DIAGNOSIS — I481 Persistent atrial fibrillation: Secondary | ICD-10-CM

## 2016-01-28 DIAGNOSIS — I5023 Acute on chronic systolic (congestive) heart failure: Secondary | ICD-10-CM

## 2016-01-28 DIAGNOSIS — N183 Chronic kidney disease, stage 3 (moderate): Secondary | ICD-10-CM

## 2016-01-28 LAB — BASIC METABOLIC PANEL
ANION GAP: 9 (ref 5–15)
BUN: 24 mg/dL — ABNORMAL HIGH (ref 6–20)
CALCIUM: 8.7 mg/dL — AB (ref 8.9–10.3)
CO2: 34 mmol/L — AB (ref 22–32)
Chloride: 96 mmol/L — ABNORMAL LOW (ref 101–111)
Creatinine, Ser: 1.63 mg/dL — ABNORMAL HIGH (ref 0.44–1.00)
GFR calc non Af Amer: 28 mL/min — ABNORMAL LOW (ref >60)
GFR, EST AFRICAN AMERICAN: 32 mL/min — AB (ref >60)
Glucose, Bld: 86 mg/dL (ref 65–99)
Potassium: 4 mmol/L (ref 3.5–5.1)
SODIUM: 139 mmol/L (ref 135–145)

## 2016-01-28 MED ORDER — FUROSEMIDE 10 MG/ML IJ SOLN
60.0000 mg | Freq: Two times a day (BID) | INTRAMUSCULAR | Status: DC
  Administered 2016-01-28 – 2016-01-29 (×3): 60 mg via INTRAVENOUS
  Filled 2016-01-28 (×3): qty 6

## 2016-01-28 NOTE — Clinical Documentation Improvement (Signed)
Cardiology  WOC consult note documenting the following diagnosis, if appropriate for this admission please document them  in notes.  Thank you     Dermatitis   Venous Ulcerations of LLE   Other  Clinically Undetermined   Supporting : Recurrent LE wounds ( H + P)   Treatment: Unna boots bilaterally   Please exercise your independent, professional judgment when responding. A specific answer is not anticipated or expected.   Thank You, [place] [phone]

## 2016-01-28 NOTE — Care Management Important Message (Signed)
Important Message  Patient Details  Name: Mia Contreras MRN: [id number] Date of Birth: Nov 10, 1928   Medicare Important Message Given:  Yes    [place] Ab[place] 01/28/2016, 10:59 AM

## 2016-01-28 NOTE — Clinical Social Work Note (Signed)
Clinical Social Work Assessment  Patient Details  Name: Mia Contreras MRN: [id number] Date of Birth: 1929-03-28  Date of referral:  01/28/16               Reason for consult:  Facility Placement, Discharge Planning                Permission sought to share information with:  Facility Sport and exercise psychologist, Family Supports, Other Permission granted to share information::  Yes, Verbal Permission Granted  Name::     Mia Contreras, Mia Contreras  Agency::  SNF's  Relationship::  Mia Contreras: Mia Contreras: Congregational Nurse  Contact Information:  A: [phone], J: [phone]  Housing/Transportation Living arrangements for the past 2 months:  [place] of Information:  Patient, Medical Team, Friend/Neighbor Patient Interpreter Needed:  None Criminal Activity/Legal Involvement Pertinent to Current Situation/Hospitalization:  No - Comment as needed Significant Relationships:  Friend, Church, [employer], Other Family Members, Siblings Lives with:  Self Do you feel safe going back to the place where you live?  Yes Need for family participation in patient care:  Yes (Comment)  Care giving concerns:  PT recommending SNF once medically stable for discharge.   Social Worker assessment / plan:  CSW met with patient. Congregational nurse at bedside. CSW introduced role and asked for verbal permission to discuss discharge planning in front of congregational nurse. Consent provided. Discussed SNF recommendation. Patient agreeable and wants [facility] but is concerned about getting her bills paid while she is away from home. CSW will look into solutions. Patient also concerned to see wound MD, Dr. Adalberto Contreras for an outpatient appointment prior to SNF admittance. CSW called Dr. Asa Contreras and he said that they would follow treatment while patient is inpatient and if she needs to make an appointment after discharge, she can do so. No further concerns. CSW encouraged patient to  contact CSW as needed. CSW will continue to follow patient and facilitate discharge to SNF once medically stable.  Employment status:   Retired Forensic scientist:  Medicare PT Recommendations:  [place] / Referral to community resources:  [place]  Patient/Family's Response to care:  Patient agreeable to SNF placement. Patient's niece and congregational nurse supportive and involved in patient's care. Patient and nurse polite and appreciated social work intervention.  Patient/Family's Understanding of and Emotional Response to Diagnosis, Current Treatment, and Prognosis:  Patient, niece, and congregational nurse knowledgeable of medical interventions and aware of discharge to SNF once medically stable.  Emotional Assessment Appearance:  Appears stated age Attitude/Demeanor/Rapport:   (Pleasant) Affect (typically observed):  Accepting, Calm, Pleasant, Appropriate Orientation:  Oriented to Self, Oriented to Place, Oriented to  Time, Oriented to Situation Alcohol / Substance use:  Tobacco Use (Quit) Psych involvement (Current and /or in the community):  No (Comment)  Discharge Needs  Concerns to be addressed:  Care Coordination Readmission within the last 30 days:  No Current discharge risk:  Dependent with Mobility, Lives alone Barriers to Discharge:  No Barriers Identified   Candie Chroman, LCSW 01/28/2016, 12:04 PM

## 2016-01-28 NOTE — Clinical Social Work Note (Signed)
CSW presented bed offers. Patient likely to choose [facility] due to proximity to home and several supports that have been there in the past.  [place] Scrape, [place]

## 2016-01-28 NOTE — Clinical Social Work Placement (Signed)
   CLINICAL SOCIAL WORK PLACEMENT  NOTE  Date:  01/28/2016  Patient Details  Name: Mia Contreras MRN: [id number] Date of Birth: [birth year]  Clinical Social Work is seeking post-discharge placement for this patient at the [place] level of care (*CSW will initial, date and re-position this form in  chart as items are completed):  Yes   Patient/family provided with [place] Work Department's list of facilities offering this level of care within the geographic area requested by the patient (or if unable, by the patient's family).  Yes   Patient/family informed of their freedom to choose among providers that offer the needed level of care, that participate in Medicare, Medicaid or managed care program needed by the patient, have an available bed and are willing to accept the patient.  Yes   Patient/family informed of [facility]'s ownership interest in [facility] and [facility], as well as of the fact that they are under no obligation to receive care at these facilities.  PASRR submitted to EDS on       PASRR number received on       Existing PASRR number confirmed on 01/28/16     FL2 transmitted to all facilities in geographic area requested by pt/family on 01/28/16     FL2 transmitted to all facilities within larger geographic area on       Patient informed that his/her managed care company has contracts with or will negotiate with certain facilities, including the following:            Patient/family informed of bed offers received.  Patient chooses bed at       Physician recommends and patient chooses bed at      Patient to be transferred to   on  .  Patient to be transferred to facility by       Patient family notified on   of transfer.  Name of family member notified:        PHYSICIAN       Additional Comment:    _______________________________________________ Candie Chroman, LCSW 01/28/2016, 12:09 PM

## 2016-01-28 NOTE — Progress Notes (Signed)
SUBJECTIVE:  Feels better.  Still has swelling.  Wants her leg dressings replaced.  OBJECTIVE:   Vitals:   Filed Vitals:   01/27/16 2045 01/28/16 0103 01/28/16 0345 01/28/16 0700  BP: 112/70 101/63 103/66   Pulse: 111 101 105   Temp: 98 F (36.7 C) 98.6 F (37 C)    TempSrc: Oral Oral    Resp: 20 18    Height:      Weight:    126 lb 4.8 oz (57.289 kg)  SpO2: 96% 95%     I&O's:   Intake/Output Summary (Last 24 hours) at 01/28/16 1122 Last data filed at 01/28/16 0953  Gross per 24 hour  Intake    840 ml  Output   4025 ml  Net  -3185 ml   TELEMETRY: Reviewed telemetry pt in AFib:     PHYSICAL EXAM General: Well developed, well nourished, in no acute distress, frail Head:   Normal cephalic and atramatic  Lungs:  Clear bilaterally to auscultation. Heart:  Ireregularly irregular S1 S2  No JVD.   Abdomen: abdomen soft and non-tender Msk:  Back normal,  Normal strength and tone for age. Extremities:  Bilateral leg edema.   Neuro: Alert and oriented. Psych:  Normal affect, responds appropriately Skin: No rash   LABS: Basic Metabolic Panel:  Recent Labs  01/26/16 1357 01/27/16 0430 01/28/16 0349  NA 137 138 139  K 3.4* 2.8* 4.0  CL 94* 94* 96*  CO2 33* 37* 34*  GLUCOSE 99 87 86  BUN 36* 32* 24*  CREATININE 2.08* 1.85* 1.63*  CALCIUM 9.3 8.6* 8.7*  MG 2.2  --   --    Liver Function Tests:  Recent Labs  01/26/16 1357  AST 33  ALT 29  ALKPHOS 71  BILITOT 1.7*  PROT 5.9*  ALBUMIN 3.1*   No results for input(s): LIPASE, AMYLASE in the last 72 hours. CBC:  Recent Labs  01/26/16 1357  WBC 6.1  NEUTROABS 4.3  HGB 10.1*  HCT 34.2*  MCV 85.7  PLT 318   Cardiac Enzymes: No results for input(s): CKTOTAL, CKMB, CKMBINDEX, TROPONINI in the last 72 hours. BNP: Invalid input(s): POCBNP D-Dimer: No results for input(s): DDIMER in the last 72 hours. Hemoglobin A1C: No results for input(s): HGBA1C in the last 72 hours. Fasting Lipid Panel: No  results for input(s): CHOL, HDL, LDLCALC, TRIG, CHOLHDL, LDLDIRECT in the last 72 hours. Thyroid Function Tests:  Recent Labs  01/26/16 1357  TSH 0.592   Anemia Panel: No results for input(s): VITAMINB12, FOLATE, FERRITIN, TIBC, IRON, RETICCTPCT in the last 72 hours. Coag Panel:   Lab Results  Component Value Date   INR 0.95 10/25/2011   INR 1.8* 05/20/2009   INR 1.7* 05/19/2009    RADIOLOGY: Dg Chest 2 View  01/27/2016  CLINICAL DATA:  Shortness of breath, weakness EXAM: CHEST  2 VIEW COMPARISON:  12/15/2015 FINDINGS: Cardiomediastinal silhouette is stable. There is dextroscoliosis lower thoracic spine. Significant levoscoliosis lumbar spine. Mild hyperinflation. Mild emphysematous changes and chronic interstitial prominence. Persistent left basilar atelectasis. Surgical clips are noted in left axilla. Again noted large left retrocardiac diaphragmatic/ hiatal hernia. No segmental infiltrate or pulmonary edema. IMPRESSION: Mild hyperinflation. Mild emphysematous changes and chronic interstitial prominence. Persistent left basilar atelectasis. Surgical clips are noted in left axilla. Again noted large left retrocardiac diaphragmatic/ hiatal hernia. No segmental infiltrate or pulmonary edema. Electronically Signed   By: Lahoma Crocker M.D.   On: 01/27/2016 08:30  ASSESSMENT: Kathyrn Lass:    1) Acute on chronic systolic heart failure: Continue IV Lasix today.  COnsider switch to PO tomorrow.   2) CKD: improving.   3) Eliquis for stroke prevention with AFib.  4) Leg wounds to be dressed. Consult order placed.  Jettie Booze, MD  01/28/2016  11:22 AM

## 2016-01-28 NOTE — Progress Notes (Signed)
   01/28/16 1400  Clinical Encounter Type  Visited With Patient  Visit Type Spiritual support;Social support  Referral From Chaplain  Consult/Referral To Chaplain  Spiritual Encounters  Spiritual Needs Emotional;Prayer  Stress Factors  Patient Stress Factors Health changes  CHP visited patient per request of friend via [place].  Patient was concerned about where she would be going to rehab. Patient had met with social worker, who was working on placement, but patient  exhibited anxiety,which included being burden to others and not having family to help. CPH reflected back her concerns and encouraged focusing on the next step.  CP[facility] spent 40 mins. Facilitating story telling. [place] prayed with patient. CHP listened to concerns about wound dressing and passed on info to Nurse. Roe Coombs 01/28/2016

## 2016-01-28 NOTE — NC FL2 (Signed)
[place] MEDICAID FL2 LEVEL OF CARE SCREENING TOOL     IDENTIFICATION  Patient Name: Mia Contreras Birthdate: May 08, 1929 Sex: female Admission Date (Current Location): 01/26/2016  [facility] and [place] Number:  Herbalist and Address:  [facility] [place]. [facility], [place] [address] [place], [place] [address]      Provider Number: [id number]  Attending Physician Name and Address:  Jolaine Artist, MD  Relative Name and Phone Number:       Current Level of Care: Hospital Recommended Level of Care: [place] Prior Approval Number:    Date Approved/Denied:   PASRR Number: [id number]  Discharge Plan: SNF    Current Diagnoses: Patient Active Problem List   Diagnosis Date Noted  . Malnutrition of moderate degree 01/27/2016  . Falls 01/26/2016  . Hypoxia 01/26/2016  . Acute on chronic diastolic congestive heart failure, NYHA class 4 ([place]) 01/26/2016  . Atrial fibrillation ([place]) 01/11/2016  . PAF (paroxysmal atrial fibrillation) ([place])   . Acute CHF ([place]) 12/15/2015  . AKI (acute kidney injury) ([place]) 12/14/2015  . Acute on chronic systolic (congestive) heart failure ([place]) 12/14/2015  . Acute on chronic combined systolic and diastolic heart failure ([place]) 12/14/2015  . Acute kidney injury ([place]) 12/14/2015  . CKD (chronic kidney disease) stage 2, GFR 60-89 ml/min 12/14/2015  . Anxiety 12/14/2015  . IBS (irritable bowel syndrome) 12/14/2015  . Leg wound, left 11/08/2015  . Hiatal hernia 05/12/2015  . Subconjunctival hemorrhage 08/07/2014  . Chronic renal failure 08/07/2014  . Nausea without vomiting 07/15/2014  . Noninfected skin tear of right leg 07/03/2014  . Hypoglycemia 12/12/2013  . Chronic systolic heart failure ([place]) 08/22/2013  . NICM (nonischemic cardiomyopathy) ([place]) 08/22/2013  . Diarrhea 10/01/2012  . Dyspnea on exertion 06/27/2012  . Asthmatic bronchitis 06/27/2012  . UTI (urinary tract infection) 11/02/2011  .  Postoperative anemia due to acute blood loss 11/01/2011  . COPD (chronic obstructive pulmonary disease) ([place]) 08/16/2011  . Cough 08/08/2011  . Preop exam for internal medicine 08/02/2011  . Cerumen impaction 08/02/2011  . Bruit 04/19/2011  . Arrhythmia 03/29/2011  . Neoplasm of uncertain behavior of skin 09/21/2010  . PREMATURE ATRIAL CONTRACTIONS 09/21/2010  . CYSTITIS 09/21/2010  . Thyrotoxicosis 05/23/2010  . GOITER, MULTINODULAR 05/06/2010  . TOBACCO USE, QUIT 06/14/2009  . ELECTROCARDIOGRAM, ABNORMAL 04/02/2009  . SKIN CANCER, HX OF 11/12/2008  . CARPAL TUNNEL SYNDROME, RIGHT 01/11/2008  . SCOLIOSIS 01/11/2008  . VENOUS INSUFFICIENCY 09/05/2007  . Edema 09/05/2007  . ANEMIA-NOS 08/20/2007  . DIVERTICULOSIS, COLON 08/20/2007  . CELLULITIS, LEG, RIGHT 08/20/2007  . OSTEOPOROSIS 08/20/2007  . Open wound of knee, leg, and ankle 07/03/2007  . Anxiety state 03/16/2007  . Essential hypertension 03/16/2007  . Osteoarthritis 03/16/2007  . hx: breast cancer, left, infiltrating ductal 03/16/2007    Orientation RESPIRATION BLADDER Height & Weight     Self, Time, Situation, Place  O2 (Nasal Canula 3 L) Continent Weight: 126 lb 4.8 oz (57.289 kg) (b scale) Height:  5\' 3"  (160 cm)  BEHAVIORAL SYMPTOMS/MOOD NEUROLOGICAL BOWEL NUTRITION STATUS   (None)  (None) Continent Diet (2 gram sodium)  AMBULATORY STATUS COMMUNICATION OF NEEDS Skin   Limited Assist Verbally Other (Comment) (Bilateral LE Venous Dermatitis pretibial region and medial malleolar. Has compresion wraps.) Has una boots changed on Wednesdays.                       Personal Care Assistance Level of Assistance  Bathing, Feeding,  Dressing Bathing Assistance: Independent Feeding assistance: Independent Dressing Assistance: Independent     Functional Limitations Info  Sight, Hearing, Speech Sight Info: Adequate Hearing Info: Adequate Speech Info: Adequate    SPECIAL CARE FACTORS FREQUENCY  PT (By licensed  PT), Blood pressure     PT Frequency: 5 x week              Contractures Contractures Info: Not present    Additional Factors Info  Code Status, Allergies Code Status Info: Full Allergies Info: Cefuroxime, Celecoxib, Deltasone, Ranitidine, Tramadol Hcl           Current Medications (01/28/2016):  This is the current hospital active medication list Current Facility-Administered Medications  Medication Dose Route Frequency Provider Last Rate Last Dose  . 0.9 %  sodium chloride infusion  250 mL Intravenous PRN Amy D Clegg, NP      . acetaminophen (TYLENOL) tablet 650 mg  650 mg Oral Q4H PRN Amy D Clegg, NP      . ALPRAZolam Duanne Moron) tablet 0.5 mg  0.5 mg Oral BID PRN Conrad , NP   0.5 mg at 01/28/16 0351  . amiodarone (PACERONE) tablet 200 mg  200 mg Oral BID Amy D Clegg, NP   200 mg at 01/28/16 1052  . apixaban (ELIQUIS) tablet 2.5 mg  2.5 mg Oral BID Amy D Clegg, NP   2.5 mg at 01/28/16 1052  . feeding supplement (ENSURE ENLIVE) (ENSURE ENLIVE) liquid 237 mL  237 mL Oral TID BM Jolaine Artist, MD   237 mL at 01/28/16 1054  . furosemide (LASIX) injection 60 mg  60 mg Intravenous BID Jettie Booze, MD      . multivitamin with minerals tablet 1 tablet  1 tablet Oral Daily Jolaine Artist, MD   1 tablet at 01/28/16 1052  . ondansetron (ZOFRAN) injection 4 mg  4 mg Intravenous Q6H PRN Amy D Clegg, NP      . potassium chloride SA (K-DUR,KLOR-CON) CR tablet 40 mEq  40 mEq Oral BID Satira Mccallum Tillery, PA-C   40 mEq at 01/28/16 1052  . sodium chloride flush (NS) 0.9 % injection 3 mL  3 mL Intravenous Q12H Amy D Clegg, NP   3 mL at 01/28/16 1053  . sodium chloride flush (NS) 0.9 % injection 3 mL  3 mL Intravenous PRN Amy Estrella Deeds, NP         Discharge Medications: Please see discharge summary for a list of discharge medications.  Relevant Imaging Results:  Relevant Lab Results:   Additional Information SS#: [id number]  Candie Chroman, LCSW    Please see  hospital d/c summary for updated medication list.  Juanice Warburton,MD 3:23 PM

## 2016-01-29 ENCOUNTER — Inpatient Hospital Stay (HOSPITAL_COMMUNITY): Payer: Medicare Other

## 2016-01-29 DIAGNOSIS — I5043 Acute on chronic combined systolic (congestive) and diastolic (congestive) heart failure: Secondary | ICD-10-CM

## 2016-01-29 DIAGNOSIS — R05 Cough: Secondary | ICD-10-CM

## 2016-01-29 LAB — URINALYSIS, ROUTINE W REFLEX MICROSCOPIC
BILIRUBIN URINE: NEGATIVE
GLUCOSE, UA: NEGATIVE mg/dL
HGB URINE DIPSTICK: NEGATIVE
KETONES UR: NEGATIVE mg/dL
Leukocytes, UA: NEGATIVE
Nitrite: NEGATIVE
PROTEIN: NEGATIVE mg/dL
Specific Gravity, Urine: 1.011 (ref 1.005–1.030)
pH: 7.5 (ref 5.0–8.0)

## 2016-01-29 LAB — BASIC METABOLIC PANEL
ANION GAP: 6 (ref 5–15)
BUN: 23 mg/dL — AB (ref 6–20)
CALCIUM: 8.7 mg/dL — AB (ref 8.9–10.3)
CO2: 35 mmol/L — ABNORMAL HIGH (ref 22–32)
CREATININE: 1.5 mg/dL — AB (ref 0.44–1.00)
Chloride: 98 mmol/L — ABNORMAL LOW (ref 101–111)
GFR calc Af Amer: 36 mL/min — ABNORMAL LOW (ref >60)
GFR, EST NON AFRICAN AMERICAN: 31 mL/min — AB (ref >60)
GLUCOSE: 95 mg/dL (ref 65–99)
Potassium: 4.1 mmol/L (ref 3.5–5.1)
Sodium: 139 mmol/L (ref 135–145)

## 2016-01-29 MED ORDER — BISOPROLOL FUMARATE 5 MG PO TABS
2.5000 mg | ORAL_TABLET | Freq: Every day | ORAL | Status: DC
  Administered 2016-01-29 – 2016-02-07 (×10): 2.5 mg via ORAL
  Filled 2016-01-29 (×10): qty 1

## 2016-01-29 MED ORDER — GUAIFENESIN-DM 100-10 MG/5ML PO SYRP
5.0000 mL | ORAL_SOLUTION | ORAL | Status: DC | PRN
  Administered 2016-01-29 – 2016-02-03 (×11): 5 mL via ORAL
  Filled 2016-01-29 (×11): qty 5

## 2016-01-29 NOTE — Progress Notes (Signed)
Patient was febrile (T-102.4) and tachycardic (HR- 133) at 2045.  Evening medications were given to include tylenol and her scheduled amiodarone.  Her oxygen saturations dropped to 86% and she was started on oxygen at 2 lpm per cannula. On reassessment at 2230 her temperature was 101.3, Heart rate was 103, her oxygen saturation was 88 on 4 lpm cannula.  MD was paged at this time.

## 2016-01-29 NOTE — Progress Notes (Signed)
Primary cardiologist: Dr. Johnsie Cancel and Dr. Haroldine Laws  Seen for followup: Acute on chronic combined heart failure  Subjective:    Complains of cough and also shivering. Breathing overall is better. No chest pain.  Objective:   Temp:  [97.2 F (36.2 C)-98.8 F (37.1 C)] 97.2 F (36.2 C) (06/10 1155) Pulse Rate:  [88-125] 125 (06/10 1155) Resp:  [18] 18 (06/10 1155) BP: (103-123)/(54-82) 121/82 mmHg (06/10 1155) SpO2:  [92 %-97 %] 92 % (06/10 1155) Weight:  [126 lb 12.8 oz (57.516 kg)] 126 lb 12.8 oz (57.516 kg) (06/10 0638) Last BM Date: 01/29/16  Filed Weights   01/26/16 1337 01/28/16 0700 01/29/16 [id number]  Weight: 128 lb 6.4 oz (58.242 kg) 126 lb 4.8 oz (57.289 kg) 126 lb 12.8 oz (57.516 kg)    Intake/Output Summary (Last 24 hours) at 01/29/16 1258 Last data filed at 01/29/16 1201  Gross per 24 hour  Intake   1070 ml  Output   1890 ml  Net   -820 ml    Telemetry: Atrial fibrillation with RVR.  Exam:  General: Elderly woman, no distress.  Lungs: Scattered rhonchi.  Cardiac: Irregularly irregular, rapid.  Abdomen: NABS.  Extremities: Improving edema.  Lab Results:  Basic Metabolic Panel:  Recent Labs Lab 01/26/16 1357 01/27/16 0430 01/28/16 0349 01/29/16 0347  NA 137 138 139 139  K 3.4* 2.8* 4.0 4.1  CL 94* 94* 96* 98*  CO2 33* 37* 34* 35*  GLUCOSE 99 87 86 95  BUN 36* 32* 24* 23*  CREATININE 2.08* 1.85* 1.63* 1.50*  CALCIUM 9.3 8.6* 8.7* 8.7*  MG 2.2  --   --   --     CBC:  Recent Labs Lab 01/26/16 1357  WBC 6.1  HGB 10.1*  HCT 34.2*  MCV 85.7  PLT 318    Echocardiogram 12/15/2015: Study Conclusions  - Left ventricle: The cavity size was normal. Wall thickness was  normal. Systolic function was mildly reduced. The estimated  ejection fraction was in the range of 45% to 50%. Diffuse  hypokinesis. - Ventricular septum: The contour showed diastolic flattening and  systolic flattening. These changes are consistent with RV  volume  and pressure overload. - Mitral valve: Calcified annulus. Mildly thickened leaflets .  There was mild regurgitation. - Left atrium: The atrium was moderately dilated. - Right ventricle: The cavity size was severely dilated. Systolic  function was moderately to severely reduced. - Right atrium: The atrium was severely dilated. - Tricuspid valve: There was malcoaptation of the valve leaflets.  There was severe regurgitation directed centrally. - Pulmonary arteries: PA peak pressure: 77 mm Hg (S).  Impressions:  - Compared to last year's study, there is worsening pulmonary  hypertension and drastic worsening of tricuspid insufficiency and  right ventricular dysfuntion.   Medications:   Scheduled Medications: . amiodarone  200 mg Oral BID  . apixaban  2.5 mg Oral BID  . feeding supplement (ENSURE ENLIVE)  237 mL Oral TID BM  . furosemide  60 mg Intravenous BID  . multivitamin with minerals  1 tablet Oral Daily  . potassium chloride  40 mEq Oral BID  . sodium chloride flush  3 mL Intravenous Q12H     PRN Medications:  sodium chloride, acetaminophen, ALPRAZolam, ondansetron (ZOFRAN) IV, sodium chloride flush   Assessment:   1. Patient complains of cough over the last 48 hours, also shivering. She is not febrile however. She is requesting cough suppressant.  2. Chronic atrial fibrillation. She is on  amiodarone and Eliquis as an outpatient. Heart rate is not well controlled.  3. Acute on chronic combined heart failure with right heart failure symptoms. She is improving on IV diuresis.  4. Chronic kidney disease, stage III with recent acute exacerbation. Pending down to 1.5.  5. History of falls, deconditioning. Plan is for her to go to [facility] once medically stable.   Plan/Discussion:    Continue IV diuresis today with switch to oral tomorrow. Follow-up PA and lateral chest x-ray. Robitussin when necessary. Check CBC and BMET in a.m. Will try adding  low-dose bisoprolol.   Satira Sark, M.D., F.A.C.C.

## 2016-01-29 NOTE — Clinical Social Work Note (Signed)
CSW spoke with patient and she confirmed she would like to go to [facility] once she is medically ready for discharge.  CSW spoke to [facility] who can take patient on Sunday or early next week whenever she is ready for discharge.  CSW to continue to follow patient's progress, throughout discharge planning.  Jones Broom. [place], MSW, [place] 01/29/2016 12:33 PM

## 2016-01-29 NOTE — Progress Notes (Signed)
1400 was called that pt  afi with AVR . Seen by MD with orders.

## 2016-01-29 NOTE — Progress Notes (Signed)
1422 zebeta was given . Pt has chils and pt feeling anxious . T 100.1. Xanax was given . Cold compress applied .emotional support provided Place a call to [place] with orders. Carried out EKG done. 1512 PT less anxious .now . Resting comfortably on cardiac chair .

## 2016-01-30 DIAGNOSIS — J189 Pneumonia, unspecified organism: Secondary | ICD-10-CM | POA: Diagnosis not present

## 2016-01-30 DIAGNOSIS — Z7901 Long term (current) use of anticoagulants: Secondary | ICD-10-CM

## 2016-01-30 DIAGNOSIS — I959 Hypotension, unspecified: Secondary | ICD-10-CM | POA: Diagnosis not present

## 2016-01-30 LAB — CBC
HCT: 33.5 % — ABNORMAL LOW (ref 36.0–46.0)
Hemoglobin: 9.8 g/dL — ABNORMAL LOW (ref 12.0–15.0)
MCH: 25.5 pg — ABNORMAL LOW (ref 26.0–34.0)
MCHC: 29.3 g/dL — ABNORMAL LOW (ref 30.0–36.0)
MCV: 87.2 fL (ref 78.0–100.0)
Platelets: 362 K/uL (ref 150–400)
RBC: 3.84 MIL/uL — ABNORMAL LOW (ref 3.87–5.11)
RDW: 20.2 % — ABNORMAL HIGH (ref 11.5–15.5)
WBC: 17.8 K/uL — ABNORMAL HIGH (ref 4.0–10.5)

## 2016-01-30 MED ORDER — LEVOFLOXACIN IN D5W 750 MG/150ML IV SOLN
750.0000 mg | Freq: Once | INTRAVENOUS | Status: AC
  Administered 2016-01-30: 750 mg via INTRAVENOUS
  Filled 2016-01-30: qty 150

## 2016-01-30 MED ORDER — LEVOFLOXACIN IN D5W 500 MG/100ML IV SOLN
500.0000 mg | INTRAVENOUS | Status: DC
  Administered 2016-02-01: 500 mg via INTRAVENOUS
  Filled 2016-01-30: qty 100

## 2016-01-30 MED ORDER — LEVOFLOXACIN IN D5W 750 MG/150ML IV SOLN: 750.0000 mg | INTRAVENOUS | Status: DC

## 2016-01-30 MED ORDER — SODIUM CHLORIDE 0.9 % IV BOLUS (SEPSIS)
250.0000 mL | Freq: Once | INTRAVENOUS | Status: AC
  Administered 2016-01-30: 250 mL via INTRAVENOUS

## 2016-01-30 MED ORDER — SODIUM CHLORIDE 0.9 % IV SOLN
Freq: Once | INTRAVENOUS | Status: AC
  Administered 2016-01-30: 250 mL via INTRAVENOUS

## 2016-01-30 NOTE — Progress Notes (Signed)
Pt will need a wound consult for her bil legs, thanks Arvella Nigh RN

## 2016-01-30 NOTE — Progress Notes (Addendum)
Patient has a manual blood pressure of 82/42 this morning. She rested well last night and is asymptomatic at this time. We obtained a bed weigh this morning as a safety precaution due to the low blood pressure. Attending notified, new orders given.

## 2016-01-30 NOTE — Progress Notes (Signed)
Subjective:  Called to see pt secondary to hypotension, fever, and elevated WBC. Pt has c/o cough last 48 hr. This am she was noted to have fever 102 and low B/P. Fortunately she is asymptomatic- sitting up in chair, ate full breakfast.   Objective:  Vital Signs in the last 24 hours: Temp:  [97.2 F (36.2 C)-102.4 F (39.1 C)] 98.5 F (36.9 C) (06/11 0601) Pulse Rate:  [92-143] 92 (06/11 0820) Resp:  [18-20] 18 (06/11 0601) BP: (66-123)/(40-82) 76/50 mmHg (06/11 0820) SpO2:  [88 %-93 %] 93 % (06/11 0601) Weight:  [124 lb (56.246 kg)] 124 lb (56.246 kg) (06/11 0610)  Intake/Output from previous day:  Intake/Output Summary (Last 24 hours) at 01/30/16 0918 Last data filed at 01/30/16 0902  Gross per 24 hour  Intake    970 ml  Output   1300 ml  Net   -330 ml    Physical Exam: General appearance: alert, cooperative, cachectic and no distress Lungs: decreased breath sounds Heart: irregularly irregular rhythm Extremities: 1-2 edema, wrapped Skin: pale, warm, dry Neurologic: Grossly normal   Rate: 100  Rhythm: atrial fibrillation  Lab Results:  Recent Labs  01/30/16 0312  WBC 17.8*  HGB 9.8*  PLT 362    Recent Labs  01/28/16 0349 01/29/16 0347  NA 139 139  K 4.0 4.1  CL 96* 98*  CO2 34* 35*  GLUCOSE 86 95  BUN 24* 23*  CREATININE 1.63* 1.50*   No results for input(s): TROPONINI in the last 72 hours.  Invalid input(s): CK, MB No results for input(s): INR in the last 72 hours.  Scheduled Meds: . sodium chloride   Intravenous Once  . amiodarone  200 mg Oral BID  . apixaban  2.5 mg Oral BID  . bisoprolol  2.5 mg Oral Daily  . feeding supplement (ENSURE ENLIVE)  237 mL Oral TID BM  . [START ON 02/01/2016] levofloxacin (LEVAQUIN) IV  500 mg Intravenous Q48H  . levofloxacin (LEVAQUIN) IV  750 mg Intravenous Once  . multivitamin with minerals  1 tablet Oral Daily  . sodium chloride flush  3 mL Intravenous Q12H   Continuous Infusions:  PRN Meds:.sodium  chloride, acetaminophen, ALPRAZolam, guaiFENesin-dextromethorphan, ondansetron (ZOFRAN) IV, sodium chloride flush   Imaging: Dg Chest Port 1 View  01/29/2016  CLINICAL DATA:  Pt reports shakiness and hypertension this AM and dry cough x 24 hours; EXAM: PORTABLE CHEST 1 VIEW COMPARISON:  01/27/2016 FINDINGS: Cardiac enlargement stable. Right lung clear. Increased retrocardiac opacity when compared to prior study. IMPRESSION: Increased retrocardiac opacity when compared to prior study suggesting consolidation and possibly effusion. Electronically Signed   By: Skipper Cliche M.D.   On: 01/29/2016 18:34    Cardiac Studies: echo pending  Assessment/Plan:  82 y.o. female with a history of NICM- 11/2015 ECHO EF 45-50%, CHF, PAF, L Breast cancer in 2000 treated with surgery and chemo. She has never had a cath, Myoview low risk 04/2011. Had 4 rounds of chemo for breast CA.   She was admitted 4/25 -12/19/15 with A/C combined CHF and AKI. Creatinine improved. She diuresed 12 L and down 19 lbs from highest weight that admission. Discharge weight 134 lbs. She was noted to be in AF then and Eliquis and Amiodarone started. She was admitted to [facility]/26[facility]16 with weakness, edema, wgt 127.  BNP was 1090, SCr 2.5. She was felt to be volume overloaded and was admitted for diuresis. She has diuresed 4.5L-4 lbs.  Pt now hypotensive with suspected  HCAP.    Principal Problem:   Acute on chronic diastolic congestive heart failure, NYHA class 4 (HCC) Active Problems:   HCAP (healthcare-associated pneumonia)   Hypotension-currently asymptomatic   COPD (chronic obstructive pulmonary disease) (HCC)   NICM (nonischemic cardiomyopathy) (HCC)   Acute kidney injury (HCC)   CKD (chronic kidney disease) stage 2, GFR 60-89 ml/min   PAF- currently in AF on Amiodarone 200 mg BID   Chronic anticoagulation-Eliquis   Malnutrition of moderate degree   PLAN: B/P is 70- I checked myself. She has received one 250 cc bolus, I ordered  another. I stopped Lasix and K+. Antibiotics started. She is tolerating this well and at this time I don't think she needs to be moved but will need to watch closely.   Kerin Ransom PA-C 01/30/2016, 9:18 AM [phone]  Patient examined chart reviewed. Not toxic appearing. LE;s chronic edema and wrapped Basilar rales. CXR only AP ? Retrocardiac infiltrate but 6/8 clearly had CHF Continue Antibiotics agree with holding diuretics given azotemia, low BP and ? Early sepsis Check urine as well.  Will monitor on telemtry for now  [employer]

## 2016-01-30 NOTE — Progress Notes (Signed)
Informed Kathleen Argue, PA.  Instructed will call nurse back.  Karie Kirks, RN.

## 2016-01-30 NOTE — Discharge Instructions (Signed)
[facility] Stay Proper nutrition can help your body recover from illness and injury.   Foods and beverages high in protein, vitamins, and minerals help rebuild muscle loss, promote healing, & reduce fall risk.   In addition to eating healthy foods, a nutrition shake is an easy, delicious way to get the nutrition you need during and after your hospital stay  It is recommended that you continue to drink 2 bottles per day of:       Ensure Enlive for at least 1 month (30 days) after your hospital stay or until appetite improves Recommend taking a daily multivitamin with minerals Recommend taking 500 mg if Vitamin C daily for 2 weeks  Tips for adding a nutrition shake into your routine: As allowed, drink one with vitamins or medications instead of water or juice Enjoy one as a tasty mid-morning or afternoon snack Drink cold or make a milkshake out of it Drink one instead of milk with cereal or snacks Use as a coffee creamer   Suggested Substitutions Ensure Plus = Boost Plus = Carnation Breakfast Essentials = Boost Compact    Information on my medicine - ELIQUIS (apixaban)  This medication education was reviewed with me or my healthcare representative as part of my discharge preparation.  The pharmacist that spoke with me during my hospital stay was:  Darl Pikes, [facility]  Why was Eliquis prescribed for you? Eliquis was prescribed for you to reduce the risk of a blood clot forming that can cause a stroke if you have a medical condition called atrial fibrillation (a type of irregular heartbeat).  What do You need to know about Eliquis ? Take your Eliquis TWICE DAILY - one tablet in the morning and one tablet in the evening with or without food. If you have difficulty swallowing the tablet whole please discuss with your pharmacist how to take the medication safely.  Take Eliquis exactly as prescribed by your doctor and DO NOT stop taking Eliquis without talking to the  doctor who prescribed the medication.  Stopping may increase your risk of developing a stroke.  Refill your prescription before you run out.  After discharge, you should have regular check-up appointments with your healthcare provider that is prescribing your Eliquis.  In the future your dose may need to be changed if your kidney function or weight changes by a significant amount or as you get older.  What do you do if you miss a dose? If you miss a dose, take it as soon as you remember on the same day and resume taking twice daily.  Do not take more than one dose of ELIQUIS at the same time to make up a missed dose.  Important Safety Information A possible side effect of Eliquis is bleeding. You should call your healthcare provider right away if you experience any of the following: ? Bleeding from an injury or your nose that does not stop. ? Unusual colored urine (red or dark brown) or unusual colored stools (red or black). ? Unusual bruising for unknown reasons. ? A serious fall or if you hit your head (even if there is no bleeding).  Some medicines may interact with Eliquis and might increase your risk of bleeding or clotting while on Eliquis. To help avoid this, consult your healthcare provider or pharmacist prior to using any new prescription or non-prescription medications, including herbals, vitamins, non-steroidal anti-inflammatory drugs (NSAIDs) and supplements.  This website has more information on Eliquis (apixaban): http://www.eliquis.com/eliquis/home -------------------------------------------------------------------------------------------------------------------------------------

## 2016-01-30 NOTE — Progress Notes (Signed)
At 1000 BP72/50 manually. After second bolus NS 250ML IV.  Pt remain asymptomatic.  Oswaldo Done PA made aware.  No new  Orders.  Will continue to monitor.  Karie Kirks, Therapist, sports.

## 2016-01-30 NOTE — Progress Notes (Signed)
BP @  0814, 66/40, P. 95 with machine, and manually 76/50, P 92 @ 0820Post NS 283ml bolus .  Pt asymptomatic.   Will continue to monitor.  Karie Kirks, Therapist, sports.

## 2016-01-31 ENCOUNTER — Inpatient Hospital Stay (HOSPITAL_COMMUNITY): Payer: Medicare Other

## 2016-01-31 DIAGNOSIS — I480 Paroxysmal atrial fibrillation: Secondary | ICD-10-CM

## 2016-01-31 DIAGNOSIS — N182 Chronic kidney disease, stage 2 (mild): Secondary | ICD-10-CM

## 2016-01-31 DIAGNOSIS — I429 Cardiomyopathy, unspecified: Secondary | ICD-10-CM

## 2016-01-31 DIAGNOSIS — E440 Moderate protein-calorie malnutrition: Secondary | ICD-10-CM

## 2016-01-31 DIAGNOSIS — Z7901 Long term (current) use of anticoagulants: Secondary | ICD-10-CM

## 2016-01-31 LAB — BASIC METABOLIC PANEL
Anion gap: 8 (ref 5–15)
BUN: 49 mg/dL — ABNORMAL HIGH (ref 6–20)
CO2: 31 mmol/L (ref 22–32)
Calcium: 8.7 mg/dL — ABNORMAL LOW (ref 8.9–10.3)
Chloride: 95 mmol/L — ABNORMAL LOW (ref 101–111)
Creatinine, Ser: 2.93 mg/dL — ABNORMAL HIGH (ref 0.44–1.00)
GFR calc Af Amer: 16 mL/min — ABNORMAL LOW (ref >60)
GFR calc non Af Amer: 14 mL/min — ABNORMAL LOW (ref >60)
Glucose, Bld: 103 mg/dL — ABNORMAL HIGH (ref 65–99)
Potassium: 5.6 mmol/L — ABNORMAL HIGH (ref 3.5–5.1)
Sodium: 134 mmol/L — ABNORMAL LOW (ref 135–145)

## 2016-01-31 LAB — CBC
HCT: 32.3 % — ABNORMAL LOW (ref 36.0–46.0)
Hemoglobin: 9.4 g/dL — ABNORMAL LOW (ref 12.0–15.0)
MCH: 25.3 pg — ABNORMAL LOW (ref 26.0–34.0)
MCHC: 29.1 g/dL — ABNORMAL LOW (ref 30.0–36.0)
MCV: 86.8 fL (ref 78.0–100.0)
Platelets: 364 10*3/uL (ref 150–400)
RBC: 3.72 MIL/uL — ABNORMAL LOW (ref 3.87–5.11)
RDW: 19.8 % — ABNORMAL HIGH (ref 11.5–15.5)
WBC: 12.5 10*3/uL — ABNORMAL HIGH (ref 4.0–10.5)

## 2016-01-31 LAB — BRAIN NATRIURETIC PEPTIDE: B Natriuretic Peptide: 1297.3 pg/mL — ABNORMAL HIGH (ref 0.0–100.0)

## 2016-01-31 MED ORDER — SODIUM CHLORIDE 0.45 % IV SOLN
INTRAVENOUS | Status: DC
  Administered 2016-01-31: 1000 mL via INTRAVENOUS
  Administered 2016-02-01: 09:00:00 via INTRAVENOUS

## 2016-01-31 MED ORDER — AMIODARONE HCL 100 MG PO TABS
100.0000 mg | ORAL_TABLET | Freq: Two times a day (BID) | ORAL | Status: DC
  Administered 2016-01-31 – 2016-02-01 (×2): 100 mg via ORAL
  Filled 2016-01-31 (×2): qty 1

## 2016-01-31 NOTE — Consult Note (Signed)
WOC consult requested for bilat leg wraps.  Please refer to previous consult on 6/7.  Una boots have been applied and are due to be changed on Wed.  Orders have been provided for staff nurses to contact ortho tech on that day. Please re-consult if further assistance is needed.  Thank-you,  Julien Girt MSN, [place], [place], [place], [place]

## 2016-01-31 NOTE — Care Management Important Message (Signed)
Important Message  Patient Details  Name: Mia Contreras MRN: [id number] Date of Birth: 01/02/1929   Medicare Important Message Given:  Yes    [name] 01/31/2016, 1:48 PM

## 2016-01-31 NOTE — Progress Notes (Signed)
Patient Name: Mia Contreras Date of Encounter: 01/31/2016  Principal Problem:   Acute on chronic diastolic congestive heart failure, NYHA class 4 (HCC) Active Problems:   COPD (chronic obstructive pulmonary disease) (HCC)   NICM (nonischemic cardiomyopathy) (HCC)   Acute kidney injury (HCC)   CKD (chronic kidney disease) stage 2, GFR 60-89 ml/min   PAF- currently in AF on Amiodarone 200 mg BID   Malnutrition of moderate degree   HCAP (healthcare-associated pneumonia)   Chronic anticoagulation-Eliquis   Hypotension-currently asymptomatic   Primary Cardiologist: Dr Johnsie Cancel  Patient Profile: 82 yo female w/ hx NICM, S-CHF, PACs, breast CA w/ surg and chemo, hyperthyroid, goiter. EF 45-50% 11/2015 echo. noted to be in AF then and Eliquis and Amiodarone started. Admitted 06/07 w/ edema, Cr 2.5, wt 127, BNP 1090, now w/ suspected HCAP.  SUBJECTIVE: Breathing OK today, still coughing, non-productive. Leg wraps changed every Monday by Dr Dellia Nims with debridement.  OBJECTIVE Filed Vitals:   01/30/16 1718 01/30/16 2111 01/31/16 0517 01/31/16 1000  BP: 86/58 83/57 93/54  97/63  Pulse: 97 108 96 95  Temp:  98.9 F (37.2 C) 98.6 F (37 C)   TempSrc:  Oral Oral   Resp: 18 18 18    Height:      Weight:   131 lb 4.8 oz (59.557 kg)   SpO2:  92% 94%     Intake/Output Summary (Last 24 hours) at 01/31/16 1106 Last data filed at 01/31/16 1000  Gross per 24 hour  Intake   1294 ml  Output    650 ml  Net    644 ml   Filed Weights   01/29/16 0638 01/30/16 0610 01/31/16 0517  Weight: 126 lb 12.8 oz (57.516 kg) 124 lb (56.246 kg) 131 lb 4.8 oz (59.557 kg)    PHYSICAL EXAM General: Well developed, well nourished, female in no acute distress. Head: Normocephalic, atraumatic.  Neck: Supple without bruits, JVD not elevated. Lungs:  Resp regular and unlabored, rales L>R base. Heart: Irreg R&R, S1, S2, no S3, S4, soft murmur; no rub. Abdomen: Soft, non-tender, non-distended, BS + x 4.    Extremities: No clubbing, cyanosis, 1+ edema. Wraps not disturbed Neuro: Alert and oriented X 3. Moves all extremities spontaneously. Psych: Normal affect.  LABS: CBC: Recent Labs  01/30/16 0312 01/31/16 0240  WBC 17.8* 12.5*  HGB 9.8* 9.4*  HCT 33.5* 32.3*  MCV 87.2 86.8  PLT 362 123456   Basic Metabolic Panel: Recent Labs  01/29/16 0347 01/31/16 0240  NA 139 134*  K 4.1 5.6*  CL 98* 95*  CO2 35* 31  GLUCOSE 95 103*  BUN 23* 49*  CREATININE 1.50* 2.93*  CALCIUM 8.7* 8.7*   BNP:  B NATRIURETIC PEPTIDE  Date/Time Value Ref Range Status  01/26/2016 01:57 PM 973.7* 0.0 - 100.0 pg/mL Final  12/14/2015 01:37 PM 1357.2* 0.0 - 100.0 pg/mL Final   Lab Results  Component Value Date   TSH 0.592 01/26/2016   TELE:   Atrial fib, rate generally ok     Radiology/Studies: Dg Chest 2 View 01/31/2016  CLINICAL DATA:  Shortness of Breath.  Cough for 3 days EXAM: CHEST  2 VIEW COMPARISON:  January 29, 2016 chest radiograph; chest CT December 01, 2006 FINDINGS: There is patchy atelectasis and consolidation in the posterior left base region, not felt to be significantly changed. Lungs elsewhere clear. Heart is mildly enlarged with pulmonary vascularity within normal limits. There is atherosclerotic calcification aorta. There are surgical clips in left  axillary region. There is rightward deviation of the upper thoracic trachea. IMPRESSION: Stable atelectasis and infiltrate posterior left base. Lungs elsewhere clear. Stable cardiac prominence. Extensive atherosclerotic calcification aorta. Rightward deviation of the upper thoracic trachea. Finding consistent with focal thyroid enlargement in this area. Electronically Signed   By: Lowella Grip III M.D.   On: 01/31/2016 07:50   Dg Chest Port 1 View 01/29/2016  CLINICAL DATA:  Pt reports shakiness and hypertension this AM and dry cough x 24 hours; EXAM: PORTABLE CHEST 1 VIEW COMPARISON:  01/27/2016 FINDINGS: Cardiac enlargement stable. Right lung  clear. Increased retrocardiac opacity when compared to prior study. IMPRESSION: Increased retrocardiac opacity when compared to prior study suggesting consolidation and possibly effusion. Electronically Signed   By: Skipper Cliche M.D.   On: 01/29/2016 18:34     Current Medications:  . amiodarone  200 mg Oral BID  . apixaban  2.5 mg Oral BID  . bisoprolol  2.5 mg Oral Daily  . feeding supplement (ENSURE ENLIVE)  237 mL Oral TID BM  . [START ON 02/01/2016] levofloxacin (LEVAQUIN) IV  500 mg Intravenous Q48H  . multivitamin with minerals  1 tablet Oral Daily  . sodium chloride flush  3 mL Intravenous Q12H      ASSESSMENT AND PLAN: Principal Problem:   Acute on chronic diastolic congestive heart failure, NYHA class 4 (HCC) - pt volume status: may be a little dry now, gentle hydration - SBP 70s yesterday, improved with fluid - wt at d/c 134 on 04/30, trended down since then, was 128 lbs on admit, diuresed to 124 lbs, now 131 lbs  Active Problems:   COPD (chronic obstructive pulmonary disease) (HCC) - no wheezing    NICM (nonischemic cardiomyopathy) (HCC) - on BB, no ACE/ARB with renal insufficiency    Acute kidney injury ([place]) - BUN/Cr 66/2.92 on admit - improved w/ diuresis to 23/1.50 on 06/10.  - Now 49/2.93  - rx with IV fluid    CKD (chronic kidney disease) stage 2, GFR 60-89 ml/min - see above    PAF- currently in AF on Amiodarone 200 mg BID - currently in afib, rate ok - CHADS2VASC=5 (age x 2, female, HTN, CHF) - continue Eliquis    Malnutrition of moderate degree - add Ensure    HCAP (healthcare-associated pneumonia) - on Levaquin - afebrile, WBCs trending down    Chronic anticoagulation-Eliquis - continue    Hypotension-currently asymptomatic - ck orthostatics, hydrate gently    Deconditioning - per chart notes, family considering placement - PT to see    Hyperkalemia - supplement d/c'd, follow, hydration should help  Signed, Rosaria Ferries ,  PA-C 11:06 AM 01/31/2016  Agree with note by Rosaria Ferries PA-C  Pt probably dry. Scr has increased. She os on ATBX for PNA. Eliquis and amio (redure dose). WBC decreasing. Will re check BNP. CXR looks OK. Pt is deconditioned and most likely will need short term SNF ([facility]). Diuretics on hold. Follow renal fxn.   Lorretta Harp, M.D., [place], [facility], [place]ver[place] [address] [place],   [address]  [phone] 01/31/2016 1:30 PM

## 2016-01-31 NOTE — Progress Notes (Signed)
Physical Therapy Treatment Patient Details Name: Mia Contreras MRN: [id number] DOB: [birth year] Today's Date: 01/31/2016    History of Present Illness 82 y.o. female admitted to [facility] on 01/26/16 for acute on chronic CHF.  Pt with significant PMhx of recurrent falls with resultant bil LE wounds, anemia, HTN, venous insufficiency, h/o skin and breast CA, premature atrial contractions, COPD, Bil TKA, and L partial mastecotmy.    PT Comments    Ms. Nusz is making modest progress but continues to require min assist w/ transfers and ambulation due to instability. SpO2 dropping to 84% on RA, 86% on 1L, and requires 2L for SpO2 to reach 90% while ambulating.  Encouraged pt to continue practicing pursed lip breathing technique throughout day.     Follow Up Recommendations  SNF     Equipment Recommendations  None recommended by PT    Recommendations for Other Services       Precautions / Restrictions Precautions Precautions: Fall Precaution Comments: pt with recent h/o recurrent falls at home Restrictions Weight Bearing Restrictions: No    Mobility  Bed Mobility Overal bed mobility: Modified Independent             General bed mobility comments: Increased time and effort  Transfers Overall transfer level: Needs assistance Equipment used: Rolling walker (2 wheeled) Transfers: Sit to/from Stand Sit to Stand: Min assist         General transfer comment: Min assist to steady pt for balance during transfers and cues for RW use.  Ambulation/Gait Ambulation/Gait assistance: Min assist Ambulation Distance (Feet): 80 Feet Assistive device: Rolling walker (2 wheeled) Gait Pattern/deviations:  (R circumducting gait pattern)   Gait velocity interpretation: at or above normal speed for age/gender General Gait Details: Min assist to support trunk for balance during gait. 4 standing rest breaks due to SOB w/ SpO2 dropping to 84% on RA, 86% on 1L, and requires 2L for SpO2 to reach  90%.   Stairs            Wheelchair Mobility    Modified Rankin (Stroke Patients Only)       Balance Overall balance assessment: Needs assistance Sitting-balance support: No upper extremity supported;Feet supported Sitting balance-Leahy Scale: Fair     Standing balance support: Bilateral upper extremity supported;During functional activity Standing balance-Leahy Scale: Poor Standing balance comment: Relies on UE support                    Cognition Arousal/Alertness: Awake/alert Behavior During Therapy: WFL for tasks assessed/performed Overall Cognitive Status: Within Functional Limits for tasks assessed (maybe some mild memory deficits)                      Exercises Other Exercises Other Exercises: Encouraged pt to practice pursed lip breathing throughout day    General Comments        Pertinent Vitals/Pain Pain Assessment: No/denies pain    Home Living                      Prior Function            PT Goals (current goals can now be found in the care plan section) Acute Rehab PT Goals Patient Stated Goal: to go to rehab, preferably at [facility] and then go back home PT Goal Formulation: With patient Time For Goal Achievement: 02/10/16 Potential to Achieve Goals: Good Progress towards PT goals: Progressing toward goals    Frequency  Min  2X/week (would benefit from more)    PT Plan Current plan remains appropriate    Co-evaluation             End of Session Equipment Utilized During Treatment: Gait belt;Oxygen Activity Tolerance: Patient limited by fatigue;Treatment limited secondary to medical complications (Comment) (hypoxia) Patient left: in chair;with call bell/phone within reach;with chair alarm set;with family/visitor present     Time: 1126-1150 PT Time Calculation (min) (ACUTE ONLY): 24 min  Charges:  $Gait Training: 23-37 mins                    G Codes:       Collie Siad PT, DPT  Pager:  [phone] Phone: [phone] 01/31/2016, 12:58 PM

## 2016-02-01 ENCOUNTER — Ambulatory Visit: Payer: Medicare Other | Admitting: Internal Medicine

## 2016-02-01 ENCOUNTER — Ambulatory Visit (HOSPITAL_COMMUNITY): Payer: Medicare Other

## 2016-02-01 LAB — BASIC METABOLIC PANEL
Anion gap: 8 (ref 5–15)
BUN: 59 mg/dL — AB (ref 6–20)
CHLORIDE: 94 mmol/L — AB (ref 101–111)
CO2: 29 mmol/L (ref 22–32)
Calcium: 8.8 mg/dL — ABNORMAL LOW (ref 8.9–10.3)
Creatinine, Ser: 2.66 mg/dL — ABNORMAL HIGH (ref 0.44–1.00)
GFR calc Af Amer: 18 mL/min — ABNORMAL LOW (ref >60)
GFR calc non Af Amer: 15 mL/min — ABNORMAL LOW (ref >60)
Glucose, Bld: 99 mg/dL (ref 65–99)
POTASSIUM: 5.2 mmol/L — AB (ref 3.5–5.1)
SODIUM: 131 mmol/L — AB (ref 135–145)

## 2016-02-01 MED ORDER — ENSURE ENLIVE PO LIQD
237.0000 mL | Freq: Two times a day (BID) | ORAL | Status: DC
  Administered 2016-02-02 – 2016-02-07 (×10): 237 mL via ORAL

## 2016-02-01 MED ORDER — AMIODARONE HCL 100 MG PO TABS
100.0000 mg | ORAL_TABLET | Freq: Every day | ORAL | Status: DC
  Administered 2016-02-02 – 2016-02-07 (×6): 100 mg via ORAL
  Filled 2016-02-01 (×6): qty 1

## 2016-02-01 NOTE — Progress Notes (Signed)
Nutrition Follow-up  DOCUMENTATION CODES:   Non-severe (moderate) malnutrition in context of chronic illness  INTERVENTION:  Continue Ensure Enlive po BID, each supplement provides 350 kcal and 20 grams of protein Encourage PO intake Multivitamin with minerals daily   NUTRITION DIAGNOSIS:   Inadequate oral intake related to poor appetite as evidenced by energy intake < or equal to 75% for > or equal to 1 month, moderate depletions of muscle mass, moderate depletion of body fat, percent weight loss.  Ongoing  GOAL:   Patient will meet greater than or equal to 90% of their needs  Unmet  MONITOR:   PO intake, Supplement acceptance, Labs, Skin, I & O's, Weight trends  REASON FOR ASSESSMENT:   Malnutrition Screening Tool    ASSESSMENT:   82 y.o. female with a history of nonischemic cardiomyopathy, systolic CHF, PACs, L Breast cancer in 2000 with surgery and chemo. Over the last few days she has had 2 falls. Very weak. C/o SOB with exertion. + edema. Has LE wound and legs just wrapped at [place].  Pt reports ongoing poor appetite due to pneumonia. States she has been eating 50% of most meals and drinking Ensure once daily. Per nursing notes, meal completion has varied from 50 to 100%. Pt's weight dropped down to 124 lbs 2 days ago, but is up to 135 lbs today; ? Accuracy. RD encouraged pt to drink Ensure twice daily to ensure adequate protein and nutrient intake.  Labs: low sodium, elevated potassium, low chloride, low calcium, elevated BUN, low hemoglobin  Diet Order:  Diet 2 gram sodium Room service appropriate?: Yes; Fluid consistency:: Thin  Skin:  Wound (see comment) (bilateral LE venous dermatitis per WOC note)  Last BM:  6/13  Height:   Ht Readings from Last 1 Encounters:  01/26/16 5\' 3"  (1.6 m)    Weight:   Wt Readings from Last 1 Encounters:  02/01/16 135 lb 11.2 oz (61.553 kg)    Ideal Body Weight:  52.27 kg  BMI:  Body mass index is 24.04  kg/(m^2).  Estimated Nutritional Needs:   Kcal:  1400-1600  Protein:  75-90 grams  Fluid:  1.4-1.6 L/day  EDUCATION NEEDS:   No education needs identified at this time  [place], LDN Inpatient Clinical Dietitian Pager: [phone] After Hours Pager: [phone]

## 2016-02-01 NOTE — Clinical Social Work Note (Signed)
CSW continues to follow for discharge needs.  Jaeden [place], CSW [phone]  

## 2016-02-01 NOTE — Progress Notes (Signed)
   Case d/w Dr. Gwenlyn Found. Chart reviewed.  The HF team will see in the am on 6/14.   Debi Cousin,MD 8:46 PM

## 2016-02-01 NOTE — Progress Notes (Signed)
Patient Name: Mia Contreras Date of Encounter: 02/01/2016  Principal Problem:   Acute on chronic diastolic congestive heart failure, NYHA class 4 (HCC) Active Problems:   COPD (chronic obstructive pulmonary disease) (HCC)   NICM (nonischemic cardiomyopathy) (HCC)   Acute kidney injury (HCC)   CKD (chronic kidney disease) stage 2, GFR 60-89 ml/min   PAF- currently in AF on Amiodarone 200 mg BID   Malnutrition of moderate degree   HCAP (healthcare-associated pneumonia)   Chronic anticoagulation-Eliquis   Hypotension-currently asymptomatic   Primary Cardiologist: Dr Johnsie Cancel  Patient Profile: 82 yo female w/ hx NICM, S-CHF, PACs, breast CA w/ surg and chemo, hyperthyroid, goiter. EF 45-50% 11/2015 echo. noted to be in AF then and Eliquis and Amiodarone started. Admitted 06/07 w/ edema, Cr 2.5, wt 127, BNP 1090, now w/ HCAP.  SUBJECTIVE: Breathing ok, but still on O2, has seen Dr Justin Mend in the past.   OBJECTIVE Filed Vitals:   01/31/16 1952 02/01/16 0518 02/01/16 0908 02/01/16 1219  BP: 96/63 104/70 95/62 92/60   Pulse: 88 85 79 79  Temp: 97.6 F (36.4 C) 98 F (36.7 C) 98.1 F (36.7 C) 97.6 F (36.4 C)  TempSrc: Oral Oral Oral Oral  Resp: 18 18 18 18   Height:      Weight:  135 lb 11.2 oz (61.553 kg)    SpO2: 96% 92% 92% 95%    Intake/Output Summary (Last 24 hours) at 02/01/16 1244 Last data filed at 02/01/16 0854  Gross per 24 hour  Intake 1434.16 ml  Output   1200 ml  Net 234.16 ml   Filed Weights   01/30/16 0610 01/31/16 0517 02/01/16 0518  Weight: 124 lb (56.246 kg) 131 lb 4.8 oz (59.557 kg) 135 lb 11.2 oz (61.553 kg)    PHYSICAL EXAM General: Well developed, well nourished, female in no acute distress. Head: Normocephalic, atraumatic.  Neck: Supple without bruits, JVD 9 cm Lungs:  Resp regular and unlabored, rales bases. Heart: Irreg R&R, S1, S2, no S3, S4, soft murmur; no rub. Abdomen: Soft, non-tender, non-distended, BS + x 4.  Extremities: No  clubbing, cyanosis, 1+ edema. Legs wrapped and not disturbed Neuro: Alert and oriented X 3. Moves all extremities spontaneously. Psych: Normal affect.  LABS: CBC: Recent Labs  01/30/16 0312 01/31/16 0240  WBC 17.8* 12.5*  HGB 9.8* 9.4*  HCT 33.5* 32.3*  MCV 87.2 86.8  PLT 362 123456   Basic Metabolic Panel: Recent Labs  01/31/16 0240 02/01/16 0303  NA 134* 131*  K 5.6* 5.2*  CL 95* 94*  CO2 31 29  GLUCOSE 103* 99  BUN 49* 59*  CREATININE 2.93* 2.66*  CALCIUM 8.7* 8.8*   BNP:  B NATRIURETIC PEPTIDE  Date/Time Value Ref Range Status  01/31/2016 02:40 AM 1297.3* 0.0 - 100.0 pg/mL Final  01/26/2016 01:57 PM 973.7* 0.0 - 100.0 pg/mL Final   Lab Results  Component Value Date   TSH 0.592 01/26/2016    TELE:   Atrial fib, rate generally ok     Radiology/Studies: Dg Chest 2 View 01/31/2016  CLINICAL DATA:  Shortness of Breath.  Cough for 3 days EXAM: CHEST  2 VIEW COMPARISON:  January 29, 2016 chest radiograph; chest CT December 01, 2006 FINDINGS: There is patchy atelectasis and consolidation in the posterior left base region, not felt to be significantly changed. Lungs elsewhere clear. Heart is mildly enlarged with pulmonary vascularity within normal limits. There is atherosclerotic calcification aorta. There are surgical clips in left axillary region.  There is rightward deviation of the upper thoracic trachea. IMPRESSION: Stable atelectasis and infiltrate posterior left base. Lungs elsewhere clear. Stable cardiac prominence. Extensive atherosclerotic calcification aorta. Rightward deviation of the upper thoracic trachea. Finding consistent with focal thyroid enlargement in this area. Electronically Signed   By: Lowella Grip III M.D.   On: 01/31/2016 07:50     Current Medications:  . amiodarone  100 mg Oral BID  . apixaban  2.5 mg Oral BID  . bisoprolol  2.5 mg Oral Daily  . feeding supplement (ENSURE ENLIVE)  237 mL Oral TID BM  . levofloxacin (LEVAQUIN) IV  500 mg  Intravenous Q48H  . multivitamin with minerals  1 tablet Oral Daily  . sodium chloride flush  3 mL Intravenous Q12H   . sodium chloride 50 mL/hr at 02/01/16 0834    ASSESSMENT AND PLAN: Principal Problem:  Acute on chronic diastolic congestive heart failure, NYHA class 4 (HCC) - pt volume status: she was a little dry, getting gentle hydration, with NS - SBP 70s 06/11, improved with fluid - wt at d/c 134 on 04/30, trended down since then, was 128 lbs on admit, diuresed to 124 lbs, now 135 lbs, continue to follow. - I/O net negative 2.9 L since admit  Active Problems:  COPD (chronic obstructive pulmonary disease) (HCC) - no wheezing   NICM (nonischemic cardiomyopathy) (HCC) - on BB, no ACE/ARB with renal insufficiency   Acute kidney injury ([place]) - BUN/Cr 66/2.92 on admit - improved w/ diuresis to 23/1.50 on 06/10.  - Now 59/2.66  - rx with IV fluid - may need Nephrology to see, BUN/Cr have been up and down since 10/2015   CKD (chronic kidney disease) stage 2, GFR 60-89 ml/min - see above   PAF- currently in AF on Amiodarone 200 mg BID - currently in afib, rate ok - CHADS2VASC=5 (age x 2, female, HTN, CHF) - continue Eliquis - amio decreased to 100 mg daily 06/12   Malnutrition of moderate degree - add Ensure   HCAP (healthcare-associated pneumonia) - on Levaquin - afebrile, WBCs trending down   Chronic anticoagulation-Eliquis - continue   Hypotension-currently asymptomatic - ck orthostatics, hydrate gently   Deconditioning - per chart notes, family considering placement - PT to see   Hyperkalemia - supplement d/c'd, follow, hydration helping    LE wounds, dressed - dressings changed last Weds, WOC will change again this Weds   Jonetta Speak , PA-C 12:44 PM 02/01/2016  Agree with note by Rosaria Ferries PA-C  Pt admitted with vol overload. She has low nl LV fxn and severe pulm HTN with chronic BLE edema. Her renal fxn has been  fluctuating making determination of approp diuretic dosing difficult. Currently her lasix is on hold and she is getting gentle hydration. Will get Renal service and CHF service to assist in her care.  Lorretta Harp, M.D., [place], [facility], [place] [address] [place], [place]  [address]  [phone] 02/01/2016 1:36 PM

## 2016-02-02 LAB — BASIC METABOLIC PANEL
ANION GAP: 7 (ref 5–15)
BUN: 49 mg/dL — AB (ref 6–20)
CALCIUM: 8.9 mg/dL (ref 8.9–10.3)
CO2: 30 mmol/L (ref 22–32)
CREATININE: 1.96 mg/dL — AB (ref 0.44–1.00)
Chloride: 96 mmol/L — ABNORMAL LOW (ref 101–111)
GFR calc Af Amer: 26 mL/min — ABNORMAL LOW (ref >60)
GFR, EST NON AFRICAN AMERICAN: 22 mL/min — AB (ref >60)
GLUCOSE: 106 mg/dL — AB (ref 65–99)
Potassium: 4.4 mmol/L (ref 3.5–5.1)
Sodium: 133 mmol/L — ABNORMAL LOW (ref 135–145)

## 2016-02-02 MED ORDER — FUROSEMIDE 10 MG/ML IJ SOLN
40.0000 mg | Freq: Two times a day (BID) | INTRAMUSCULAR | Status: AC
  Administered 2016-02-02 (×2): 40 mg via INTRAVENOUS
  Filled 2016-02-02 (×2): qty 4

## 2016-02-02 MED ORDER — LEVOFLOXACIN 500 MG PO TABS
500.0000 mg | ORAL_TABLET | ORAL | Status: DC
  Administered 2016-02-03 – 2016-02-07 (×3): 500 mg via ORAL
  Filled 2016-02-02 (×5): qty 1

## 2016-02-02 NOTE — Progress Notes (Signed)
Orthopedic Tech Progress Note Patient Details:  Mia Contreras 07-14-1929 [id number]  Ortho Devices Type of Ortho Device: Louretta Parma boot Ortho Device/Splint Location: (B) LE Ortho Device/Splint Interventions: Ordered, Application   Braulio Bosch 02/02/2016, 4:36 PM

## 2016-02-02 NOTE — Progress Notes (Signed)
Paged ortho tech to change [employer]. Paged Dawn, wound care RN for instructions on changing mepilexes under unna boots. Dawn, instructed RN to remove meplix dressings and clean with gauze and NS. Wounds cleaned and new mepilexes applied. L medial wound 4 cm x 0.5 cm. L lateral tibia 4 cm x 0.5 cm. L Tibia wound 5cm x 0.5 cm. R tibia wound 1.0 cm x1.0 cm. R lateral shin 2.0 cm x 3.0 cm. Ortho tech paged to change unna boots. Will continue to monitor.

## 2016-02-02 NOTE — Progress Notes (Addendum)
Advanced Heart Failure Rounding Note   Subjective:    Admitted 01/26/16 with volume overload. Diuresed well up until 01/29/16 and developed cough that worsened with chills and fever up to 102.  Pt was also hypotensive.  Cr on admit 2.92 improved to 1.5 with diuresis.  Cr elevated to 2.93 again on 01/31/16, likely due to hypotension.  Diuretics held and given gentle fluid rehydration.     Creatinine improve this am to 1.9, but weight now higher than on admit.  Diuresis complicated by fluctuating renal function.   Objective:   Weight Range:  Vital Signs:   Temp:  [97.6 F (36.4 C)-98.1 F (36.7 C)] 97.7 F (36.5 C) (06/14 0550) Pulse Rate:  [77-89] 89 (06/14 0550) Resp:  [18] 18 (06/14 0550) BP: (92-118)/(60-66) 108/63 mmHg (06/14 0550) SpO2:  [92 %-98 %] 92 % (06/14 0550) Weight:  [132 lb 6.4 oz (60.056 kg)] 132 lb 6.4 oz (60.056 kg) (06/14 0550) Last BM Date: 02/01/16  Weight change: Filed Weights   01/31/16 0517 02/01/16 0518 02/02/16 0550  Weight: 131 lb 4.8 oz (59.557 kg) 135 lb 11.2 oz (61.553 kg) 132 lb 6.4 oz (60.056 kg)    Intake/Output:   Intake/Output Summary (Last 24 hours) at 02/02/16 0803 Last data filed at 02/02/16 0724  Gross per 24 hour  Intake    600 ml  Output   1950 ml  Net  -1350 ml     PHYSICAL EXAM: General: Elderly. Sitting on side of bed. NAD.  HEENT: normal Neck: supple. JVP9-10 Carotids 2+ bilat; no bruits. No thyromegaly or nodule noted.  Cor: PMI nondisplaced. Irregular rate & rhythm. No M/G/R. Lungs: Diminished basilar sounds Abdomen: soft, non-tender, non-distended, no HSM. No bruits or masses. +BS  Extremities: no cyanosis, clubbing, rash. R and LLE wrapped with coban. Above wrap she has 1+ edema into thighs  Neuro: alert & oriented x 3, cranial nerves grossly intact. moves all 4 extremities w/o difficulty. Affect pleasant.  TELE: Chronic AF 80-90  Labs: Basic Metabolic Panel:  Recent Labs Lab 01/26/16 1357   01/28/16 0349 01/29/16 0347 01/31/16 0240 02/01/16 0303 02/02/16 0422  NA 137  < > 139 139 134* 131* 133*  K 3.4*  < > 4.0 4.1 5.6* 5.2* 4.4  CL 94*  < > 96* 98* 95* 94* 96*  CO2 33*  < > 34* 35* 31 29 30   GLUCOSE 99  < > 86 95 103* 99 106*  BUN 36*  < > 24* 23* 49* 59* 49*  CREATININE 2.08*  < > 1.63* 1.50* 2.93* 2.66* 1.96*  CALCIUM 9.3  < > 8.7* 8.7* 8.7* 8.8* 8.9  MG 2.2  --   --   --   --   --   --   < > = values in this interval not displayed.  Liver Function Tests:  Recent Labs Lab 01/26/16 1357  AST 33  ALT 29  ALKPHOS 71  BILITOT 1.7*  PROT 5.9*  ALBUMIN 3.1*   No results for input(s): LIPASE, AMYLASE in the last 168 hours. No results for input(s): AMMONIA in the last 168 hours.  CBC:  Recent Labs Lab 01/26/16 1357 01/30/16 0312 01/31/16 0240  WBC 6.1 17.8* 12.5*  NEUTROABS 4.3  --   --   HGB 10.1* 9.8* 9.4*  HCT 34.2* 33.5* 32.3*  MCV 85.7 87.2 86.8  PLT 318 362 364    Cardiac Enzymes: No results for input(s): CKTOTAL, CKMB, CKMBINDEX, TROPONINI in the last 168  hours.  BNP: BNP (last 3 results)  Recent Labs  12/14/15 1337 01/26/16 1357 01/31/16 0240  BNP 1357.2* 973.7* 1297.3*    ProBNP (last 3 results)  Recent Labs  12/01/15 1052  PROBNP 1090.0*      Other results:  Imaging: No results found.   Medications:     Scheduled Medications: . amiodarone  100 mg Oral Daily  . apixaban  2.5 mg Oral BID  . bisoprolol  2.5 mg Oral Daily  . feeding supplement (ENSURE ENLIVE)  237 mL Oral BID BM  . levofloxacin (LEVAQUIN) IV  500 mg Intravenous Q48H  . multivitamin with minerals  1 tablet Oral Daily  . sodium chloride flush  3 mL Intravenous Q12H    Infusions:    PRN Medications: sodium chloride, acetaminophen, ALPRAZolam, guaiFENesin-dextromethorphan, ondansetron (ZOFRAN) IV, sodium chloride flush   Assessment:   1. Acute/Chronic systolic HF with R>>L symptoms. ECHO 11/2015 EF 45-50%.  NYHA III. Volume status elevated.  Admit for IV diuresis.  2. AKI/CKD- Creatinine trending up. Suspect will improve with IV diuresis 3. LE wounds- Followed at the wound center. Ask woundcare to see in the hospital.  4. H/o COPD 5. H/o breast cancer s/p chemo 2000 6. Chronic A fib- Rate controlled. On Eliquis twice a day.  This patients CHA2DS2-VASc Score and unadjusted Ischemic Stroke Rate (% per year) is equal to 7.2 % stroke rate/year from a score of 5 Above score calculated as 1 point each if present [CHF, HTN, DM, Vascular=MI/PAD/Aortic Plaque, Age if 65-74, or Female] Above score calculated as 2 points each if present [Age > 75, or Stroke/TIA/TE] 7. Falls- 2 falls this week. Live alone. Not safe to return home. Consult PT. 8. Acute respiratory failure with Hypoxia- O2 sat on arrival to clininc was 81 %. Placed on 2 liters with O2 sat up to 90%.  9. HCAP  Plan/Discussion:    Now on levaquin for HCAP. No further fever.  Volume status back up with diuretics held and gently IVF for AKI. Will diurese gently with IV lasix 40 mg BID. Watch pressures closely.   Continue anticoagulation.   Likely to SNF on Friday.  Renal to see per [facility] note yesterday. (Pt follows with renal as outpatient)  Shirley Friar, PA-C 8:03 AM   Length of Stay: 7  Advanced Heart Failure Team Pager [phone] (M-F; 7a - 4p)  Please contact [facility] for night-coverage after hours (4p -7a ) and weekends on [email or web address]  Patient seen and examined with Oda Kilts, PA-C. We discussed all aspects of the encounter. I agree with the assessment and plan as stated above.   Renal function improved and now responding to IV diuretics. Will assess response overnight. Unclear to me if AKI due to ATN from hypotension or cardiorenal syndrome. If unable to tolerate IV diuresis may need to consider RHC to better define hemodynamics. Needs better diuresis before d/c. May not be ready by Friday.   Bensimhon, Daniel,MD 8:13 PM

## 2016-02-03 ENCOUNTER — Inpatient Hospital Stay (HOSPITAL_COMMUNITY): Payer: Medicare Other

## 2016-02-03 ENCOUNTER — Encounter: Payer: Medicare Other | Admitting: Internal Medicine

## 2016-02-03 LAB — CULTURE, BLOOD (ROUTINE X 2)
CULTURE: NO GROWTH
CULTURE: NO GROWTH

## 2016-02-03 LAB — BASIC METABOLIC PANEL
ANION GAP: 7 (ref 5–15)
BUN: 40 mg/dL — ABNORMAL HIGH (ref 6–20)
CALCIUM: 8.7 mg/dL — AB (ref 8.9–10.3)
CO2: 33 mmol/L — ABNORMAL HIGH (ref 22–32)
Chloride: 97 mmol/L — ABNORMAL LOW (ref 101–111)
Creatinine, Ser: 1.67 mg/dL — ABNORMAL HIGH (ref 0.44–1.00)
GFR, EST AFRICAN AMERICAN: 31 mL/min — AB (ref >60)
GFR, EST NON AFRICAN AMERICAN: 27 mL/min — AB (ref >60)
Glucose, Bld: 80 mg/dL (ref 65–99)
Potassium: 3.5 mmol/L (ref 3.5–5.1)
Sodium: 137 mmol/L (ref 135–145)

## 2016-02-03 MED ORDER — METOLAZONE 2.5 MG PO TABS
2.5000 mg | ORAL_TABLET | Freq: Once | ORAL | Status: AC
  Administered 2016-02-03: 2.5 mg via ORAL
  Filled 2016-02-03: qty 1

## 2016-02-03 MED ORDER — DM-GUAIFENESIN ER 30-600 MG PO TB12
1.0000 | ORAL_TABLET | Freq: Two times a day (BID) | ORAL | Status: AC
  Administered 2016-02-03 – 2016-02-04 (×4): 1 via ORAL
  Filled 2016-02-03 (×4): qty 1

## 2016-02-03 MED ORDER — FUROSEMIDE 10 MG/ML IJ SOLN
40.0000 mg | Freq: Two times a day (BID) | INTRAMUSCULAR | Status: DC
Start: 2016-02-03 — End: 2016-02-04
  Administered 2016-02-03 (×2): 40 mg via INTRAVENOUS
  Filled 2016-02-03 (×2): qty 4

## 2016-02-03 MED ORDER — POTASSIUM CHLORIDE CRYS ER 20 MEQ PO TBCR
40.0000 meq | EXTENDED_RELEASE_TABLET | Freq: Two times a day (BID) | ORAL | Status: AC
  Administered 2016-02-03 (×2): 40 meq via ORAL
  Filled 2016-02-03 (×2): qty 2

## 2016-02-03 NOTE — Care Management Important Message (Signed)
Important Message  Patient Details  Name: Mia Contreras MRN: [id number] Date of Birth: 01/10/1929   Medicare Important Message Given:  Yes    [name] 02/03/2016, 9:43 AM

## 2016-02-03 NOTE — Progress Notes (Signed)
Introduced self to pt as Soil scientist 3p-7p.  Call bell at reach.  Instructed to call for assistance as needed.  Verbalized understanding.  Karie Kirks, Therapist, sports.

## 2016-02-03 NOTE — Progress Notes (Signed)
Physical Therapy Treatment Patient Details Name: Mia Contreras MRN: [id number] DOB: 1929/02/18 Today's Date: 02/03/2016    History of Present Illness 82 y.o. female admitted to [facility] on 01/26/16 for acute on chronic CHF.  Pt with significant PMhx of recurrent falls with resultant bil LE wounds, anemia, HTN, venous insufficiency, h/o skin and breast CA, premature atrial contractions, COPD, Bil TKA, and L partial mastecotmy.    PT Comments    Improving slowly with endurance.  Still needs portable O2 at ~2L to maintain adequate SpO2 levels.  Follow Up Recommendations  SNF     Equipment Recommendations  None recommended by PT    Recommendations for Other Services       Precautions / Restrictions Precautions Precautions: Fall Precaution Comments: pt with recent h/o recurrent falls at home    Mobility  Bed Mobility               General bed mobility comments: OOB already  Transfers Overall transfer level: Needs assistance Equipment used: Rolling walker (2 wheeled) Transfers: Sit to/from Stand Sit to Stand: Min guard         General transfer comment: cues for hand placement for safety  Ambulation/Gait Ambulation/Gait assistance: Min assist Ambulation Distance (Feet): 240 Feet (with 4 standing rest breaks) Assistive device: Rolling walker (2 wheeled) Gait Pattern/deviations: Trendelenburg (R hip drop with some circumduction)   Gait velocity interpretation: Below normal speed for age/gender General Gait Details: stability assist   Stairs            Wheelchair Mobility    Modified Rankin (Stroke Patients Only)       Balance Overall balance assessment: Needs assistance Sitting-balance support: No upper extremity supported Sitting balance-Leahy Scale: Fair     Standing balance support: Bilateral upper extremity supported;Single extremity supported Standing balance-Leahy Scale: Poor                      Cognition Arousal/Alertness:  Awake/alert Behavior During Therapy: WFL for tasks assessed/performed Overall Cognitive Status: Within Functional Limits for tasks assessed                      Exercises Other Exercises Other Exercises: mini squats x 12 reps    General Comments General comments (skin integrity, edema, etc.): Initially on RA for over an hour, sats at 87%, with 2L sats rose to 91% at 105 bpm.  After 80', sats 93% at 113 bpm, but quickly dropped to 88% at 118 bpm.  With efficient breating got sats to 92% at 99 bpm.  On return at 150', sats dropped to 89% at 110-120 bpm.      Pertinent Vitals/Pain Pain Assessment: No/denies pain    Home Living                      Prior Function            PT Goals (current goals can now be found in the care plan section) Acute Rehab PT Goals PT Goal Formulation: With patient Time For Goal Achievement: 02/10/16 Potential to Achieve Goals: Good Progress towards PT goals: Progressing toward goals    Frequency  Min 2X/week    PT Plan Current plan remains appropriate    Co-evaluation             End of Session Equipment Utilized During Treatment: Gait belt;Oxygen Activity Tolerance: Patient tolerated treatment well Patient left: in chair;with call bell/phone within reach;with chair alarm  set     Time: [id number] PT Time Calculation (min) (ACUTE ONLY): 32 min  Charges:  $Gait Training: 8-22 mins $Therapeutic Activity: 8-22 mins                    G Codes:      Rakiyah Esch, Tessie Fass 02/03/2016, 2:45 PM 02/03/2016  Donnella Sham, PT [phone]  (pager)

## 2016-02-03 NOTE — Progress Notes (Signed)
Advanced Heart Failure Rounding Note   Subjective:    Admitted 01/26/16 with volume overload. Diuresed well up until 01/29/16 and developed cough that worsened with chills and fever up to 102.  Pt was also hypotensive.  Cr on admit 2.92 improved to 1.5 with diuresis.  Cr elevated to 2.93 again on 01/31/16, likely due to hypotension.  Diuretics held and given gentle fluid rehydration.    Feeling OK this morning. Fluid coming down but still swollen.  Coughing more with PNA.  States she had PNA several years ago and this feels different (felt much sicker then).   Creatinine trending down. 1.6 this am.   Out 1.5 L yesterday and down 2 lbs.   Objective:   Weight Range:  Vital Signs:   Temp:  [97.5 F (36.4 C)-97.8 F (36.6 C)] 97.8 F (36.6 C) (06/15 0640) Pulse Rate:  [92-93] 93 (06/15 0640) Resp:  [18] 18 (06/15 0640) BP: (84-109)/(53-67) 94/53 mmHg (06/15 0640) SpO2:  [93 %-96 %] 93 % (06/15 0640) Weight:  [130 lb 3.2 oz (59.058 kg)] 130 lb 3.2 oz (59.058 kg) (06/15 0640) Last BM Date: 02/02/16  Weight change: Filed Weights   02/01/16 0518 02/02/16 0550 02/03/16 0640  Weight: 135 lb 11.2 oz (61.553 kg) 132 lb 6.4 oz (60.056 kg) 130 lb 3.2 oz (59.058 kg)    Intake/Output:   Intake/Output Summary (Last 24 hours) at 02/03/16 0930 Last data filed at 02/03/16 0645  Gross per 24 hour  Intake    720 ml  Output   1251 ml  Net   -531 ml     PHYSICAL EXAM: General: Elderly. Sitting on side of bed. NAD.  HEENT: normal Neck: supple. JVP8-9 Carotids 2+ bilat; no bruits. No thyromegaly or nodule noted.  Cor: PMI nondisplaced. Irregular rate & rhythm. No M/G/R. Lungs: Diminished basilar sounds Abdomen: soft, NT, ND, no HSM. No bruits or masses. +BS  Extremities: no cyanosis, clubbing, rash. R and LLE wrapped with coban. Above wrap she has 1+ edema into thighs  Neuro: alert & oriented x 3, cranial nerves grossly intact. moves all 4 extremities w/o difficulty. Affect  pleasant.  TELE: Chronic AF 80-90  Labs: Basic Metabolic Panel:  Recent Labs Lab 01/29/16 0347 01/31/16 0240 02/01/16 0303 02/02/16 0422 02/03/16 0417  NA 139 134* 131* 133* 137  K 4.1 5.6* 5.2* 4.4 3.5  CL 98* 95* 94* 96* 97*  CO2 35* 31 29 30  33*  GLUCOSE 95 103* 99 106* 80  BUN 23* 49* 59* 49* 40*  CREATININE 1.50* 2.93* 2.66* 1.96* 1.67*  CALCIUM 8.7* 8.7* 8.8* 8.9 8.7*    Liver Function Tests: No results for input(s): AST, ALT, ALKPHOS, BILITOT, PROT, ALBUMIN in the last 168 hours. No results for input(s): LIPASE, AMYLASE in the last 168 hours. No results for input(s): AMMONIA in the last 168 hours.  CBC:  Recent Labs Lab 01/30/16 0312 01/31/16 0240  WBC 17.8* 12.5*  HGB 9.8* 9.4*  HCT 33.5* 32.3*  MCV 87.2 86.8  PLT 362 364    Cardiac Enzymes: No results for input(s): CKTOTAL, CKMB, CKMBINDEX, TROPONINI in the last 168 hours.  BNP: BNP (last 3 results)  Recent Labs  12/14/15 1337 01/26/16 1357 01/31/16 0240  BNP 1357.2* 973.7* 1297.3*    ProBNP (last 3 results)  Recent Labs  12/01/15 1052  PROBNP 1090.0*      Other results:  Imaging: No results found.   Medications:     Scheduled Medications: . amiodarone  100 mg Oral Daily  . apixaban  2.5 mg Oral BID  . bisoprolol  2.5 mg Oral Daily  . dextromethorphan-guaiFENesin  1 tablet Oral BID  . feeding supplement (ENSURE ENLIVE)  237 mL Oral BID BM  . furosemide  40 mg Intravenous BID  . levofloxacin  500 mg Oral Q48H  . metolazone  2.5 mg Oral Once  . multivitamin with minerals  1 tablet Oral Daily  . sodium chloride flush  3 mL Intravenous Q12H    Infusions:    PRN Medications: sodium chloride, acetaminophen, ALPRAZolam, guaiFENesin-dextromethorphan, ondansetron (ZOFRAN) IV, sodium chloride flush   Assessment:   1. Acute/Chronic systolic HF with R>>L symptoms. ECHO 11/2015 EF 45-50%.  NYHA III. Volume status elevated. Admit for IV diuresis.  2. AKI/CKD- Creatinine  trending up. Suspect will improve with IV diuresis 3. LE wounds- Followed at the wound center. Ask woundcare to see in the hospital.  4. H/o COPD 5. H/o breast cancer s/p chemo 2000 6. Chronic A fib- Rate controlled. On Eliquis twice a day.  This patients CHA2DS2-VASc Score and unadjusted Ischemic Stroke Rate (% per year) is equal to 7.2 % stroke rate/year from a score of 5 Above score calculated as 1 point each if present [CHF, HTN, DM, Vascular=MI/PAD/Aortic Plaque, Age if 65-74, or Female] Above score calculated as 2 points each if present [Age > 75, or Stroke/TIA/TE] 7. Falls- 2 falls this week. Live alone. Not safe to return home. Consult PT. 8. Acute respiratory failure with Hypoxia- O2 sat on arrival to clininc was 81 %. Placed on 2 liters with O2 sat up to 90%.  9. HCAP  Plan/Discussion:    Now on levaquin for HCAP. No further fever.  Volume status remains elevated but creatinine improving back on diuretics.  Will repeat IV lasix 40 mg BID and give 2.5 mg metolazone this am.  Supp K+.  Continue anticoagulation.   She will likely be here over weekend.    Shirley Friar, PA-C 9:30 AM   Length of Stay: 8  Advanced Heart Failure Team Pager [phone] (M-F; 7a - 4p)  Please contact [facility] for night-coverage after hours (4p -7a ) and weekends on [email or web address]  Patient seen and examined with Oda Kilts, PA-C. We discussed all aspects of the encounter. I agree with the assessment and plan as stated above.   Volume status and renal function continue to improve. Continue diuresis. Supp K+ Continue levaquin for HCAP. Repeat CXR in am.   Bensimhon, Daniel,MD 4:56 PM

## 2016-02-03 NOTE — Clinical Social Work Note (Signed)
CSW assisted patient with admissions paperwork for [facility].  Dayton Scrape, [place]

## 2016-02-04 LAB — BASIC METABOLIC PANEL
Anion gap: 8 (ref 5–15)
BUN: 35 mg/dL — AB (ref 6–20)
CHLORIDE: 96 mmol/L — AB (ref 101–111)
CO2: 34 mmol/L — AB (ref 22–32)
Calcium: 8.8 mg/dL — ABNORMAL LOW (ref 8.9–10.3)
Creatinine, Ser: 1.62 mg/dL — ABNORMAL HIGH (ref 0.44–1.00)
GFR calc Af Amer: 33 mL/min — ABNORMAL LOW (ref >60)
GFR, EST NON AFRICAN AMERICAN: 28 mL/min — AB (ref >60)
GLUCOSE: 80 mg/dL (ref 65–99)
POTASSIUM: 3.2 mmol/L — AB (ref 3.5–5.1)
SODIUM: 138 mmol/L (ref 135–145)

## 2016-02-04 MED ORDER — POTASSIUM CHLORIDE CRYS ER 20 MEQ PO TBCR
40.0000 meq | EXTENDED_RELEASE_TABLET | Freq: Two times a day (BID) | ORAL | Status: AC
  Administered 2016-02-04 (×2): 40 meq via ORAL
  Filled 2016-02-04 (×2): qty 2

## 2016-02-04 MED ORDER — FUROSEMIDE 10 MG/ML IJ SOLN
60.0000 mg | Freq: Two times a day (BID) | INTRAMUSCULAR | Status: DC
  Administered 2016-02-04 – 2016-02-07 (×7): 60 mg via INTRAVENOUS
  Filled 2016-02-04 (×7): qty 6

## 2016-02-04 NOTE — Progress Notes (Signed)
Advanced Heart Failure Rounding Note   Subjective:    Admitted 01/26/16 with volume overload. Diuresed well up until 01/29/16 and developed cough that worsened with chills and fever up to 102.  Pt was also hypotensive.  Cr on admit 2.92 improved to 1.5 with diuresis.  Cr elevated to 2.93 again on 01/31/16, likely due to hypotension.  Diuretics held and given gentle fluid rehydration.    Feeling OK this morning. Worried about blisters on her legs (WOC has seen once this admission). Would like wound nurse to look at again.  Denies SOB, but still coughing with clear to white sputum  Creatinine stable to improved. Out 600 cc and down 1 lb.    Objective:   Weight Range:  Vital Signs:   Temp:  [97.8 F (36.6 C)-98.3 F (36.8 C)] 98.3 F (36.8 C) (06/16 0535) Pulse Rate:  [70-94] 94 (06/16 0535) Resp:  [16-18] 18 (06/16 0535) BP: (94-108)/(49-60) 94/49 mmHg (06/16 0535) SpO2:  [93 %-95 %] 94 % (06/16 0535) Weight:  [129 lb 10.1 oz (58.8 kg)] 129 lb 10.1 oz (58.8 kg) (06/16 0535) Last BM Date: 02/03/16  Weight change: Filed Weights   02/02/16 0550 02/03/16 0640 02/04/16 0535  Weight: 132 lb 6.4 oz (60.056 kg) 130 lb 3.2 oz (59.058 kg) 129 lb 10.1 oz (58.8 kg)    Intake/Output:   Intake/Output Summary (Last 24 hours) at 02/04/16 0810 Last data filed at 02/04/16 0801  Gross per 24 hour  Intake    924 ml  Output   2252 ml  Net  -1328 ml     PHYSICAL EXAM: General: Elderly. Ling in bed. NAD.  HEENT: normal Neck: supple. JVP8-9 Carotids 2+ bilat; no bruits. No thyromegaly or nodule noted.  Cor: PMI nondisplaced. Irregular rate & rhythm. No M/G/R. Lungs: Diminished basilar sounds Abdomen: soft, non-tender, no HSM. No bruits or masses. +BS  Extremities: no cyanosis, clubbing, rash. R and LLE wrapped with coban. Above wrap she has 1+ edema into thighs  Neuro: alert & oriented x 3, cranial nerves grossly intact. moves all 4 extremities w/o difficulty. Affect  pleasant.  TELE: Reviewed personally, Chronic AF 80-90  Labs: Basic Metabolic Panel:  Recent Labs Lab 01/31/16 0240 02/01/16 0303 02/02/16 0422 02/03/16 0417 02/04/16 0519  NA 134* 131* 133* 137 138  K 5.6* 5.2* 4.4 3.5 3.2*  CL 95* 94* 96* 97* 96*  CO2 31 29 30  33* 34*  GLUCOSE 103* 99 106* 80 80  BUN 49* 59* 49* 40* 35*  CREATININE 2.93* 2.66* 1.96* 1.67* 1.62*  CALCIUM 8.7* 8.8* 8.9 8.7* 8.8*    Liver Function Tests: No results for input(s): AST, ALT, ALKPHOS, BILITOT, PROT, ALBUMIN in the last 168 hours. No results for input(s): LIPASE, AMYLASE in the last 168 hours. No results for input(s): AMMONIA in the last 168 hours.  CBC:  Recent Labs Lab 01/30/16 0312 01/31/16 0240  WBC 17.8* 12.5*  HGB 9.8* 9.4*  HCT 33.5* 32.3*  MCV 87.2 86.8  PLT 362 364    Cardiac Enzymes: No results for input(s): CKTOTAL, CKMB, CKMBINDEX, TROPONINI in the last 168 hours.  BNP: BNP (last 3 results)  Recent Labs  12/14/15 1337 01/26/16 1357 01/31/16 0240  BNP 1357.2* 973.7* 1297.3*    ProBNP (last 3 results)  Recent Labs  12/01/15 1052  PROBNP 1090.0*      Other results:  Imaging: Dg Chest 2 View  02/03/2016  CLINICAL DATA:  Cough for 1 week EXAM: CHEST  2  VIEW COMPARISON:  01/31/2016 FINDINGS: Cardiac shadow is mildly enlarged. The right lung is clear. Left lung is well aerated with lower lobe infiltrates. No sizable effusion is seen. No bony abnormality is noted. IMPRESSION: Left basilar infiltrate. Electronically Signed   By: Inez Catalina M.D.   On: 02/03/2016 19:00     Medications:     Scheduled Medications: . amiodarone  100 mg Oral Daily  . apixaban  2.5 mg Oral BID  . bisoprolol  2.5 mg Oral Daily  . dextromethorphan-guaiFENesin  1 tablet Oral BID  . feeding supplement (ENSURE ENLIVE)  237 mL Oral BID BM  . furosemide  60 mg Intravenous BID  . levofloxacin  500 mg Oral Q48H  . multivitamin with minerals  1 tablet Oral Daily  . potassium  chloride  40 mEq Oral BID  . sodium chloride flush  3 mL Intravenous Q12H    Infusions:    PRN Medications: sodium chloride, acetaminophen, ALPRAZolam, guaiFENesin-dextromethorphan, ondansetron (ZOFRAN) IV, sodium chloride flush   Assessment:   1. Acute/Chronic systolic HF with R>>L symptoms. ECHO 11/2015 EF 45-50%.  NYHA III. Volume status elevated. Admit for IV diuresis.  2. AKI/CKD- Creatinine trending up. Suspect will improve with IV diuresis 3. LE wounds- Followed at the wound center. Ask woundcare to see in the hospital.  4. H/o COPD 5. H/o breast cancer s/p chemo 2000 6. Chronic A fib- Rate controlled. On Eliquis twice a day.  This patients CHA2DS2-VASc Score and unadjusted Ischemic Stroke Rate (% per year) is equal to 7.2 % stroke rate/year from a score of 5 Above score calculated as 1 point each if present [CHF, HTN, DM, Vascular=MI/PAD/Aortic Plaque, Age if 65-74, or Female] Above score calculated as 2 points each if present [Age > 75, or Stroke/TIA/TE] 7. Falls- 2 falls this week. Live alone. Not safe to return home. Consult PT. 8. Acute respiratory failure with Hypoxia- O2 sat on arrival to clininc was 81 %. Placed on 2 liters with O2 sat up to 90%.  9. HCAP  Plan/Discussion:    Now on levaquin for HCAP. No further fever. CXR 02/03/16 with left basilar infiltrates, otherwise clear  Volume status remains slightly elevated. Will give at least one more day of IV lasix, will increase to 60 mg BID and supp K.   Continue anticoagulation.    Shirley Friar, PA-C 8:10 AM   Length of Stay: 9  Advanced Heart Failure Team Pager [phone] (M-F; 7a - 4p)  Please contact [facility] for night-coverage after hours (4p -7a ) and weekends on [email or web address]  Patient seen and examined with Oda Kilts, PA-C. We discussed all aspects of the encounter. I agree with the assessment and plan as stated above.   She is diuresing steadily. Renal function stable. Still volume  overloaded. Continue IV lasix. Supp K+. PT recommending SNF. Hopefully ready by Monday.   Maricsa Sammons,MD 8:55 AM

## 2016-02-04 NOTE — Care Management Important Message (Signed)
Important Message  Patient Details  Name: Mia Contreras MRN: [id number] Date of Birth: 1929-08-05   Medicare Important Message Given:  Yes    [name] 02/04/2016, 9:23 AM

## 2016-02-04 NOTE — Consult Note (Signed)
WOC re-consulted per patient request. She is concerned about her compression wraps and blisters on her legs.  Her Unna's boots are intact with no strike through. They were replaced on Wednesday as scheduled.  I have met with the patient and reassured her that the wraps will be ok until her next scheduled follow up appointment with Dr. Dellia Nims at the wound care center.  If by chance she has not been DC by then I will check on the legs next Wednesday with the dressing/compression wraps are due to be changed.  She assures me that she has already made herself an appointment with Dr. Dellia Nims at the wound care center for next Thursday June 22nd.  This will be fine to have her dressing/compression wraps changed at that time.  Discussed POC with patient and bedside nurse.  Re consult if needed, will not follow at this time. Thanks  Roe Wilner Kellogg, [place] [phone])

## 2016-02-05 LAB — BASIC METABOLIC PANEL
ANION GAP: 9 (ref 5–15)
BUN: 30 mg/dL — ABNORMAL HIGH (ref 6–20)
CHLORIDE: 90 mmol/L — AB (ref 101–111)
CO2: 40 mmol/L — ABNORMAL HIGH (ref 22–32)
Calcium: 9 mg/dL (ref 8.9–10.3)
Creatinine, Ser: 1.44 mg/dL — ABNORMAL HIGH (ref 0.44–1.00)
GFR calc non Af Amer: 32 mL/min — ABNORMAL LOW (ref >60)
GFR, EST AFRICAN AMERICAN: 37 mL/min — AB (ref >60)
Glucose, Bld: 87 mg/dL (ref 65–99)
POTASSIUM: 3 mmol/L — AB (ref 3.5–5.1)
SODIUM: 139 mmol/L (ref 135–145)

## 2016-02-05 NOTE — Progress Notes (Signed)
   02/05/16 0738  Clinical Encounter Type  Visited With Other (Comment) (Spoke with Health care provoder)  Visit Type Other (Comment) (Phone conversation)  Stress Factors  Patient Stress Factors Not reviewed  Family Stress Factors Not reviewed  Conferred with nurse on 02/04/16 at 6:22 pm .  Nurse was informed that Advanced Directives were available Mon-Fri before 4.

## 2016-02-05 NOTE — Progress Notes (Signed)
MD note today states patient remains on IV lasix and continues to diurese. Possible d/c on Monday to [name] if medically stable per MD.  SW services will continue to monitor for date of stability.  Lorie Phenix. Pauline Good, [place] (weekend coverage)

## 2016-02-05 NOTE — Progress Notes (Addendum)
Advanced Heart Failure Rounding Note   Subjective:    Admitted 01/26/16 with volume overload. Diuresed well up until 01/29/16 and developed cough that worsened with chills and fever up to 102.  Pt was also hypotensive.  Cr on admit 2.92 improved to 1.5 with diuresis.  Cr elevated to 2.93 again on 01/31/16, likely due to hypotension.  Diuretics held and given gentle fluid rehydration.    Continues to diurese. Weight down another 3 pounds. Weight almost back to admit weight (which was overloaded) Feeling OK. Still worried about blisters on her legs (WOC has seen twice this admission and reassured her).   Creatinine continues to improve.   Objective:   Weight Range:  Vital Signs:   Temp:  [97.8 F (36.6 C)-97.9 F (36.6 C)] 97.9 F (36.6 C) (06/17 0629) Pulse Rate:  [61-103] 103 (06/17 0629) Resp:  [16-18] 18 (06/17 0629) BP: (96-114)/(60-66) 101/61 mmHg (06/17 0629) SpO2:  [93 %-99 %] 93 % (06/17 0629) Weight:  [57.335 kg (126 lb 6.4 oz)] 57.335 kg (126 lb 6.4 oz) (06/17 0629) Last BM Date: 02/03/16  Weight change: Filed Weights   02/03/16 0640 02/04/16 0535 02/05/16 0629  Weight: 59.058 kg (130 lb 3.2 oz) 58.8 kg (129 lb 10.1 oz) 57.335 kg (126 lb 6.4 oz)    Intake/Output:   Intake/Output Summary (Last 24 hours) at 02/05/16 0911 Last data filed at 02/05/16 [id number]  Gross per 24 hour  Intake    350 ml  Output   3375 ml  Net  -3025 ml     PHYSICAL EXAM: General: Elderly. Sitting in chair NAD.  HEENT: normal Neck: supple. JVP8-9 Carotids 2+ bilat; no bruits. No thyromegaly or nodule noted.  Cor: PMI nondisplaced. Irregular rate & rhythm. No M/G/R. Lungs: Diminished basilar sounds Abdomen: soft, non-tender, no HSM. No bruits or masses. +BS  Extremities: no cyanosis, clubbing, rash. R and LLE wrapped with coban. Above wrap she has 1+ edema into thighs  Neuro: alert & oriented x 3, cranial nerves grossly intact. moves all 4 extremities w/o difficulty. Affect  pleasant.  TELE: Reviewed personally, Chronic AF 80-90  Labs: Basic Metabolic Panel:  Recent Labs Lab 02/01/16 0303 02/02/16 0422 02/03/16 0417 02/04/16 0519 02/05/16 0340  NA 131* 133* 137 138 139  K 5.2* 4.4 3.5 3.2* 3.0*  CL 94* 96* 97* 96* 90*  CO2 29 30 33* 34* 40*  GLUCOSE 99 106* 80 80 87  BUN 59* 49* 40* 35* 30*  CREATININE 2.66* 1.96* 1.67* 1.62* 1.44*  CALCIUM 8.8* 8.9 8.7* 8.8* 9.0    Liver Function Tests: No results for input(s): AST, ALT, ALKPHOS, BILITOT, PROT, ALBUMIN in the last 168 hours. No results for input(s): LIPASE, AMYLASE in the last 168 hours. No results for input(s): AMMONIA in the last 168 hours.  CBC:  Recent Labs Lab 01/30/16 0312 01/31/16 0240  WBC 17.8* 12.5*  HGB 9.8* 9.4*  HCT 33.5* 32.3*  MCV 87.2 86.8  PLT 362 364    Cardiac Enzymes: No results for input(s): CKTOTAL, CKMB, CKMBINDEX, TROPONINI in the last 168 hours.  BNP: BNP (last 3 results)  Recent Labs  12/14/15 1337 01/26/16 1357 01/31/16 0240  BNP 1357.2* 973.7* 1297.3*    ProBNP (last 3 results)  Recent Labs  12/01/15 1052  PROBNP 1090.0*      Other results:  Imaging: Dg Chest 2 View  02/03/2016  CLINICAL DATA:  Cough for 1 week EXAM: CHEST  2 VIEW COMPARISON:  01/31/2016 FINDINGS: Cardiac shadow  is mildly enlarged. The right lung is clear. Left lung is well aerated with lower lobe infiltrates. No sizable effusion is seen. No bony abnormality is noted. IMPRESSION: Left basilar infiltrate. Electronically Signed   By: Inez Catalina M.D.   On: 02/03/2016 19:00     Medications:     Scheduled Medications: . amiodarone  100 mg Oral Daily  . apixaban  2.5 mg Oral BID  . bisoprolol  2.5 mg Oral Daily  . feeding supplement (ENSURE ENLIVE)  237 mL Oral BID BM  . furosemide  60 mg Intravenous BID  . levofloxacin  500 mg Oral Q48H  . multivitamin with minerals  1 tablet Oral Daily  . sodium chloride flush  3 mL Intravenous Q12H    Infusions:    PRN  Medications: sodium chloride, acetaminophen, ALPRAZolam, guaiFENesin-dextromethorphan, ondansetron (ZOFRAN) IV, sodium chloride flush   Assessment:   1. Acute/Chronic systolic HF with R>>L symptoms. ECHO 11/2015 EF 45-50%.  NYHA III. Volume status elevated. Admit for IV diuresis.  2. AKI/CKD- Creatinine trending up. Suspect will improve with IV diuresis 3. LE wounds- Followed at the wound center. Ask woundcare to see in the hospital.  4. H/o COPD 5. H/o breast cancer s/p chemo 2000 6. Chronic A fib- Rate controlled. On Eliquis twice a day.  This patients CHA2DS2-VASc Score and unadjusted Ischemic Stroke Rate (% per year) is equal to 7.2 % stroke rate/year from a score of 5 Above score calculated as 1 point each if present [CHF, HTN, DM, Vascular=MI/PAD/Aortic Plaque, Age if 65-74, or Female] Above score calculated as 2 points each if present [Age > 75, or Stroke/TIA/TE] 7. Falls- 2 falls this week. Live alone. Not safe to return home. Consult PT. 8. Acute respiratory failure with Hypoxia- O2 sat on arrival to clininc was 81 %. Placed on 2 liters with O2 sat up to 90%.  9. HCAP  Plan/Discussion:    Now on levaquin for HCAP. No further fever. CXR 02/03/16 with left basilar infiltrates, otherwise clear  She is diuresing steadily. Renal function stable. Still volume overloaded. Continue IV lasix. Supp K+. PT recommending SNF. Hopefully ready by Monday.   Continue anticoagulation.    Glori Bickers, MD   9:11 AM   Length of Stay: 10  Advanced Heart Failure Team Pager [phone] (M-F; 7a - 4p)  Please contact [facility] for night-coverage after hours (4p -7a ) and weekends on [email or web address]

## 2016-02-06 LAB — BASIC METABOLIC PANEL
Anion gap: 8 (ref 5–15)
BUN: 26 mg/dL — AB (ref 6–20)
CO2: 44 mmol/L — ABNORMAL HIGH (ref 22–32)
CREATININE: 1.34 mg/dL — AB (ref 0.44–1.00)
Calcium: 8.9 mg/dL (ref 8.9–10.3)
Chloride: 87 mmol/L — ABNORMAL LOW (ref 101–111)
GFR calc Af Amer: 41 mL/min — ABNORMAL LOW (ref >60)
GFR, EST NON AFRICAN AMERICAN: 35 mL/min — AB (ref >60)
GLUCOSE: 79 mg/dL (ref 65–99)
Potassium: 2.4 mmol/L — CL (ref 3.5–5.1)
SODIUM: 139 mmol/L (ref 135–145)

## 2016-02-06 MED ORDER — POTASSIUM CHLORIDE 10 MEQ/100ML IV SOLN
10.0000 meq | INTRAVENOUS | Status: AC
  Administered 2016-02-06 (×2): 10 meq via INTRAVENOUS
  Filled 2016-02-06 (×2): qty 100

## 2016-02-06 MED ORDER — SPIRONOLACTONE 25 MG PO TABS
12.5000 mg | ORAL_TABLET | Freq: Every day | ORAL | Status: DC
  Administered 2016-02-06: 12.5 mg via ORAL
  Filled 2016-02-06: qty 1

## 2016-02-06 MED ORDER — POTASSIUM CHLORIDE CRYS ER 20 MEQ PO TBCR
40.0000 meq | EXTENDED_RELEASE_TABLET | Freq: Once | ORAL | Status: AC
  Administered 2016-02-06: 40 meq via ORAL
  Filled 2016-02-06: qty 2

## 2016-02-06 MED ORDER — POTASSIUM CHLORIDE 10 MEQ/100ML IV SOLN
10.0000 meq | INTRAVENOUS | Status: AC
  Administered 2016-02-06 (×4): 10 meq via INTRAVENOUS
  Filled 2016-02-06 (×4): qty 100

## 2016-02-06 MED ORDER — POTASSIUM CHLORIDE CRYS ER 20 MEQ PO TBCR
20.0000 meq | EXTENDED_RELEASE_TABLET | Freq: Two times a day (BID) | ORAL | Status: DC
Start: 2016-02-06 — End: 2016-02-07
  Administered 2016-02-06 – 2016-02-07 (×2): 20 meq via ORAL
  Filled 2016-02-06 (×2): qty 1

## 2016-02-06 NOTE — Progress Notes (Signed)
Advanced Heart Failure Rounding Note   Subjective:    Admitted 01/26/16 with volume overload. Diuresed well up until 01/29/16 and developed cough that worsened with chills and fever up to 102.  Pt was also hypotensive.  Cr on admit 2.92 improved to 1.5 with diuresis.  Cr elevated to 2.93 again on 01/31/16, likely due to hypotension.  Diuretics held and given gentle fluid rehydration.    Continues to diurese. Weight down another 3 pounds. Weight now below admit weight. Feeling OK. Walked with nurse yesterday.   Creatinine continues to improve. K 2.4  Objective:   Weight Range:  Vital Signs:   Temp:  [97.6 F (36.4 C)-98.1 F (36.7 C)] 98.1 F (36.7 C) (06/18 0548) Pulse Rate:  [54-98] 89 (06/18 0548) Resp:  [17-20] 17 (06/18 0548) BP: (97-109)/(56-66) 105/56 mmHg (06/18 0548) SpO2:  [94 %-99 %] 94 % (06/18 0548) Weight:  [56.065 kg (123 lb 9.6 oz)] 56.065 kg (123 lb 9.6 oz) (06/18 0548) Last BM Date: 02/05/16  Weight change: Filed Weights   02/04/16 0535 02/05/16 0629 02/06/16 0548  Weight: 58.8 kg (129 lb 10.1 oz) 57.335 kg (126 lb 6.4 oz) 56.065 kg (123 lb 9.6 oz)    Intake/Output:   Intake/Output Summary (Last 24 hours) at 02/06/16 0915 Last data filed at 02/06/16 0550  Gross per 24 hour  Intake    963 ml  Output   2301 ml  Net  -1338 ml     PHYSICAL EXAM: General: Elderly. Sitting on side of bed NAD.  HEENT: normal Neck: supple. JVP7-8  Carotids 2+ bilat; no bruits. No thyromegaly or nodule noted.  Cor: PMI nondisplaced. Irregular rate & rhythm. No M/G/R. Lungs: Diminished basilar sounds Abdomen: soft, non-tender, no HSM. No bruits or masses. +BS  Extremities: no cyanosis, clubbing, rash. R and LLE wrapped with coban. Mild edema under wraps Neuro: alert & oriented x 3, cranial nerves grossly intact. moves all 4 extremities w/o difficulty. Affect pleasant.  TELE: Reviewed personally, Chronic AF 80-90  Labs: Basic Metabolic Panel:  Recent Labs Lab  02/02/16 0422 02/03/16 0417 02/04/16 0519 02/05/16 0340 02/06/16 0239  NA 133* 137 138 139 139  K 4.4 3.5 3.2* 3.0* 2.4*  CL 96* 97* 96* 90* 87*  CO2 30 33* 34* 40* 44*  GLUCOSE 106* 80 80 87 79  BUN 49* 40* 35* 30* 26*  CREATININE 1.96* 1.67* 1.62* 1.44* 1.34*  CALCIUM 8.9 8.7* 8.8* 9.0 8.9    Liver Function Tests: No results for input(s): AST, ALT, ALKPHOS, BILITOT, PROT, ALBUMIN in the last 168 hours. No results for input(s): LIPASE, AMYLASE in the last 168 hours. No results for input(s): AMMONIA in the last 168 hours.  CBC:  Recent Labs Lab 01/31/16 0240  WBC 12.5*  HGB 9.4*  HCT 32.3*  MCV 86.8  PLT 364    Cardiac Enzymes: No results for input(s): CKTOTAL, CKMB, CKMBINDEX, TROPONINI in the last 168 hours.  BNP: BNP (last 3 results)  Recent Labs  12/14/15 1337 01/26/16 1357 01/31/16 0240  BNP 1357.2* 973.7* 1297.3*    ProBNP (last 3 results)  Recent Labs  12/01/15 1052  PROBNP 1090.0*      Other results:  Imaging: No results found.   Medications:     Scheduled Medications: . amiodarone  100 mg Oral Daily  . apixaban  2.5 mg Oral BID  . bisoprolol  2.5 mg Oral Daily  . feeding supplement (ENSURE ENLIVE)  237 mL Oral BID BM  .  furosemide  60 mg Intravenous BID  . levofloxacin  500 mg Oral Q48H  . multivitamin with minerals  1 tablet Oral Daily  . potassium chloride  10 mEq Intravenous Q1 Hr x 6  . sodium chloride flush  3 mL Intravenous Q12H    Infusions:    PRN Medications: sodium chloride, acetaminophen, ALPRAZolam, guaiFENesin-dextromethorphan, ondansetron (ZOFRAN) IV, sodium chloride flush   Assessment:   1. Acute/Chronic systolic HF with R>>L symptoms. ECHO 11/2015 EF 45-50%.  NYHA III. Volume status elevated. Admit for IV diuresis.  2. AKI/CKD- Creatinine trending up. Suspect will improve with IV diuresis 3. LE wounds- Followed at the wound center. Ask woundcare to see in the hospital.  4. H/o COPD 5. H/o breast  cancer s/p chemo 2000 6. Chronic A fib- Rate controlled. On Eliquis twice a day.  This patients CHA2DS2-VASc Score and unadjusted Ischemic Stroke Rate (% per year) is equal to 7.2 % stroke rate/year from a score of 5 Above score calculated as 1 point each if present [CHF, HTN, DM, Vascular=MI/PAD/Aortic Plaque, Age if 65-74, or Female] Above score calculated as 2 points each if present [Age > 75, or Stroke/TIA/TE] 7. Falls- 2 falls this week. Live alone. Not safe to return home. Consult PT. 8. Acute respiratory failure with Hypoxia- O2 sat on arrival to clininc was 81 %. Placed on 2 liters with O2 sat up to 90%.  9. HCAP  Plan/Discussion:    Now on levaquin for HCAP. No further fever. CXR 02/03/16 with left basilar infiltrates, otherwise clear  She is diuresing steadily. Renal function improving.  Still mildly volume overloaded. Continue IV lasix one mor day then switch to po. Supp K+ aggressively .   Pending d/c to [facility] when ready. Likely tomorrow or Tuesday  Continue anticoagulation.    Glori Bickers, MD   9:15 AM   Length of Stay: 11  Advanced Heart Failure Team Pager [phone] (M-F; 7a - 4p)  Please contact [facility] for night-coverage after hours (4p -7a ) and weekends on [email or web address]

## 2016-02-07 ENCOUNTER — Other Ambulatory Visit: Payer: Self-pay | Admitting: *Deleted

## 2016-02-07 DIAGNOSIS — N2581 Secondary hyperparathyroidism of renal origin: Secondary | ICD-10-CM | POA: Diagnosis not present

## 2016-02-07 DIAGNOSIS — W228XXA Striking against or struck by other objects, initial encounter: Secondary | ICD-10-CM | POA: Diagnosis not present

## 2016-02-07 DIAGNOSIS — Z87891 Personal history of nicotine dependence: Secondary | ICD-10-CM | POA: Diagnosis not present

## 2016-02-07 DIAGNOSIS — N183 Chronic kidney disease, stage 3 (moderate): Secondary | ICD-10-CM | POA: Diagnosis not present

## 2016-02-07 DIAGNOSIS — I5023 Acute on chronic systolic (congestive) heart failure: Secondary | ICD-10-CM | POA: Diagnosis not present

## 2016-02-07 DIAGNOSIS — S81802A Unspecified open wound, left lower leg, initial encounter: Secondary | ICD-10-CM | POA: Diagnosis not present

## 2016-02-07 DIAGNOSIS — E785 Hyperlipidemia, unspecified: Secondary | ICD-10-CM | POA: Diagnosis not present

## 2016-02-07 DIAGNOSIS — L97811 Non-pressure chronic ulcer of other part of right lower leg limited to breakdown of skin: Secondary | ICD-10-CM | POA: Diagnosis not present

## 2016-02-07 DIAGNOSIS — I272 Other secondary pulmonary hypertension: Secondary | ICD-10-CM | POA: Diagnosis not present

## 2016-02-07 DIAGNOSIS — I482 Chronic atrial fibrillation: Secondary | ICD-10-CM | POA: Diagnosis not present

## 2016-02-07 DIAGNOSIS — I509 Heart failure, unspecified: Secondary | ICD-10-CM | POA: Diagnosis not present

## 2016-02-07 DIAGNOSIS — R2689 Other abnormalities of gait and mobility: Secondary | ICD-10-CM | POA: Diagnosis not present

## 2016-02-07 DIAGNOSIS — E0590 Thyrotoxicosis, unspecified without thyrotoxic crisis or storm: Secondary | ICD-10-CM | POA: Diagnosis not present

## 2016-02-07 DIAGNOSIS — E0789 Other specified disorders of thyroid: Secondary | ICD-10-CM | POA: Diagnosis not present

## 2016-02-07 DIAGNOSIS — F419 Anxiety disorder, unspecified: Secondary | ICD-10-CM | POA: Diagnosis not present

## 2016-02-07 DIAGNOSIS — L97221 Non-pressure chronic ulcer of left calf limited to breakdown of skin: Secondary | ICD-10-CM | POA: Diagnosis not present

## 2016-02-07 DIAGNOSIS — Z85828 Personal history of other malignant neoplasm of skin: Secondary | ICD-10-CM | POA: Diagnosis not present

## 2016-02-07 DIAGNOSIS — Z9221 Personal history of antineoplastic chemotherapy: Secondary | ICD-10-CM | POA: Diagnosis not present

## 2016-02-07 DIAGNOSIS — D631 Anemia in chronic kidney disease: Secondary | ICD-10-CM | POA: Diagnosis not present

## 2016-02-07 DIAGNOSIS — M1990 Unspecified osteoarthritis, unspecified site: Secondary | ICD-10-CM | POA: Diagnosis not present

## 2016-02-07 DIAGNOSIS — K589 Irritable bowel syndrome without diarrhea: Secondary | ICD-10-CM | POA: Diagnosis not present

## 2016-02-07 DIAGNOSIS — R278 Other lack of coordination: Secondary | ICD-10-CM | POA: Diagnosis not present

## 2016-02-07 DIAGNOSIS — Z7901 Long term (current) use of anticoagulants: Secondary | ICD-10-CM | POA: Diagnosis not present

## 2016-02-07 DIAGNOSIS — N184 Chronic kidney disease, stage 4 (severe): Secondary | ICD-10-CM | POA: Diagnosis not present

## 2016-02-07 DIAGNOSIS — J189 Pneumonia, unspecified organism: Secondary | ICD-10-CM | POA: Diagnosis not present

## 2016-02-07 DIAGNOSIS — J9601 Acute respiratory failure with hypoxia: Secondary | ICD-10-CM | POA: Diagnosis not present

## 2016-02-07 DIAGNOSIS — I5033 Acute on chronic diastolic (congestive) heart failure: Secondary | ICD-10-CM | POA: Diagnosis not present

## 2016-02-07 DIAGNOSIS — S60512A Abrasion of left hand, initial encounter: Secondary | ICD-10-CM | POA: Diagnosis not present

## 2016-02-07 DIAGNOSIS — I491 Atrial premature depolarization: Secondary | ICD-10-CM | POA: Diagnosis not present

## 2016-02-07 DIAGNOSIS — S81812A Laceration without foreign body, left lower leg, initial encounter: Secondary | ICD-10-CM | POA: Diagnosis not present

## 2016-02-07 DIAGNOSIS — L97211 Non-pressure chronic ulcer of right calf limited to breakdown of skin: Secondary | ICD-10-CM | POA: Diagnosis not present

## 2016-02-07 DIAGNOSIS — N189 Chronic kidney disease, unspecified: Secondary | ICD-10-CM | POA: Diagnosis not present

## 2016-02-07 DIAGNOSIS — N179 Acute kidney failure, unspecified: Secondary | ICD-10-CM | POA: Diagnosis not present

## 2016-02-07 DIAGNOSIS — R41841 Cognitive communication deficit: Secondary | ICD-10-CM | POA: Diagnosis not present

## 2016-02-07 DIAGNOSIS — I429 Cardiomyopathy, unspecified: Secondary | ICD-10-CM | POA: Diagnosis not present

## 2016-02-07 DIAGNOSIS — R296 Repeated falls: Secondary | ICD-10-CM | POA: Diagnosis not present

## 2016-02-07 DIAGNOSIS — I110 Hypertensive heart disease with heart failure: Secondary | ICD-10-CM | POA: Diagnosis not present

## 2016-02-07 DIAGNOSIS — I890 Lymphedema, not elsewhere classified: Secondary | ICD-10-CM | POA: Diagnosis not present

## 2016-02-07 DIAGNOSIS — Z923 Personal history of irradiation: Secondary | ICD-10-CM | POA: Diagnosis not present

## 2016-02-07 DIAGNOSIS — D649 Anemia, unspecified: Secondary | ICD-10-CM | POA: Diagnosis not present

## 2016-02-07 DIAGNOSIS — Z853 Personal history of malignant neoplasm of breast: Secondary | ICD-10-CM | POA: Diagnosis not present

## 2016-02-07 DIAGNOSIS — I87311 Chronic venous hypertension (idiopathic) with ulcer of right lower extremity: Secondary | ICD-10-CM | POA: Diagnosis not present

## 2016-02-07 DIAGNOSIS — L97821 Non-pressure chronic ulcer of other part of left lower leg limited to breakdown of skin: Secondary | ICD-10-CM | POA: Diagnosis not present

## 2016-02-07 DIAGNOSIS — I87331 Chronic venous hypertension (idiopathic) with ulcer and inflammation of right lower extremity: Secondary | ICD-10-CM | POA: Diagnosis not present

## 2016-02-07 DIAGNOSIS — S81812D Laceration without foreign body, left lower leg, subsequent encounter: Secondary | ICD-10-CM | POA: Diagnosis not present

## 2016-02-07 DIAGNOSIS — I5022 Chronic systolic (congestive) heart failure: Secondary | ICD-10-CM | POA: Diagnosis not present

## 2016-02-07 DIAGNOSIS — Z5181 Encounter for therapeutic drug level monitoring: Secondary | ICD-10-CM | POA: Diagnosis not present

## 2016-02-07 DIAGNOSIS — I130 Hypertensive heart and chronic kidney disease with heart failure and stage 1 through stage 4 chronic kidney disease, or unspecified chronic kidney disease: Secondary | ICD-10-CM | POA: Diagnosis not present

## 2016-02-07 DIAGNOSIS — J449 Chronic obstructive pulmonary disease, unspecified: Secondary | ICD-10-CM | POA: Diagnosis not present

## 2016-02-07 DIAGNOSIS — G5600 Carpal tunnel syndrome, unspecified upper limb: Secondary | ICD-10-CM | POA: Diagnosis not present

## 2016-02-07 LAB — BASIC METABOLIC PANEL
Anion gap: 6 (ref 5–15)
BUN: 25 mg/dL — AB (ref 6–20)
CALCIUM: 8.9 mg/dL (ref 8.9–10.3)
CHLORIDE: 91 mmol/L — AB (ref 101–111)
CO2: 41 mmol/L — AB (ref 22–32)
Creatinine, Ser: 1.26 mg/dL — ABNORMAL HIGH (ref 0.44–1.00)
GFR, EST AFRICAN AMERICAN: 44 mL/min — AB (ref >60)
GFR, EST NON AFRICAN AMERICAN: 38 mL/min — AB (ref >60)
GLUCOSE: 95 mg/dL (ref 65–99)
Potassium: 3.5 mmol/L (ref 3.5–5.1)
Sodium: 138 mmol/L (ref 135–145)

## 2016-02-07 MED ORDER — SPIRONOLACTONE 25 MG PO TABS
25.0000 mg | ORAL_TABLET | Freq: Every day | ORAL | Status: DC
  Administered 2016-02-07: 25 mg via ORAL
  Filled 2016-02-07: qty 1

## 2016-02-07 MED ORDER — TORSEMIDE 20 MG PO TABS: 40.0000 mg | ORAL_TABLET | Freq: Two times a day (BID) | ORAL | Status: DC

## 2016-02-07 MED ORDER — POTASSIUM CHLORIDE CRYS ER 20 MEQ PO TBCR: 20.0000 meq | EXTENDED_RELEASE_TABLET | Freq: Two times a day (BID) | ORAL | Status: DC

## 2016-02-07 MED ORDER — ALPRAZOLAM 0.5 MG PO TABS: 0.5000 mg | ORAL_TABLET | Freq: Two times a day (BID) | ORAL | Status: DC | PRN

## 2016-02-07 MED ORDER — BISOPROLOL FUMARATE 5 MG PO TABS: 2.5000 mg | ORAL_TABLET | Freq: Every day | ORAL | Status: DC

## 2016-02-07 MED ORDER — SPIRONOLACTONE 25 MG PO TABS: 25.0000 mg | ORAL_TABLET | Freq: Every day | ORAL | Status: DC

## 2016-02-07 MED ORDER — TORSEMIDE 20 MG PO TABS
40.0000 mg | ORAL_TABLET | Freq: Two times a day (BID) | ORAL | Status: DC
Start: 2016-02-07 — End: 2016-02-07

## 2016-02-07 NOTE — Discharge Summary (Signed)
Advanced Heart Failure Team  Discharge Summary   Patient ID: CABRIELLE BILICKI MRN: [id number], DOB/AGE: 1928/12/02 82 y.o. Admit date: 01/26/2016 D/C date:     02/07/2016   Primary Discharge Diagnoses:  1. Acute/Chronic systolic HF with R>>L symptoms. ECHO 11/2015 EF 45-50%.  NYHA III. Volume status elevated. Admit for IV diuresis.  2. AKI/CKD- Creatinine trending up. Suspect will improve with IV diuresis 3. LE wounds- Followed at the wound center. Ask woundcare to see in the hospital.  4. H/o COPD 5. H/o breast cancer s/p chemo 2000 6. Chronic A fib- Rate controlled. On Eliquis twice a day.  This patients CHA2DS2-VASc Score and unadjusted Ischemic Stroke Rate (% per year) is equal to 7.2 % stroke rate/year from a score of 5 Above score calculated as 1 point each if present [CHF, HTN, DM, Vascular=MI/PAD/Aortic Plaque, Age if 65-74, or Female] Above score calculated as 2 points each if present [Age > 75, or Stroke/TIA/TE] 7. Falls- 2 falls this week. Live alone. Not safe to return home. Consult PT. 8. Acute respiratory failure with Hypoxia- O2 sat on arrival to clininc was 81 %. Placed on 2 liters with O2 sat up to 90%.  9. HCAP  Hospital Course:  ANALENA TAPANI is a 82 y.o. female with a history of nonischemic cardiomyopathy, systolic CHF, PACs, L Breast cancer in 2000 with surgery and chemo. She has never had a cath. Had 4 rounds of chemo for breast CA. Doesn't remember which agents she got.  Admitted with marked volume overload, falls, and fatigue. Also hypoxic on admit from hf clinic with oxygen saturation 81%. Oxygenation improved with diuresis. Diuresed with IV lasix and transitioned to torsemide 40 mg twice a day + 25 mg spironolactone daily. Overall she diuresed 10 pounds. On the day of discharge oxygen saturation was 95% on room air.   CXR was completed and showed left basilar infiltrates so she completed 8 days of levaquin. She is in chronic A fib with controlled rate.  Continue low dose eliquis for anticoagulation.   Due to frequent falls at home, PT consulted with recommendations for SNF. SW consulted to SNF placement. She will be discharged to [facility] in stable condition. Plan to continue PT. She will need BMET on June 26th with results faxed to HF clinic at [phone]. She will need daily weights, low salt diet, and limit fluid intake to < 2 liters per day.   Discharge Weight: 125 pounds.  Discharge Vitals: Blood pressure 98/58, pulse 72, temperature 97.4 F (36.3 C), temperature source Oral, resp. rate 18, height 5\' 3"  (1.6 m), weight 125 lb 9.6 oz (56.972 kg), SpO2 95 %.  Labs: Lab Results  Component Value Date   WBC 12.5* 01/31/2016   HGB 9.4* 01/31/2016   HCT 32.3* 01/31/2016   MCV 86.8 01/31/2016   PLT 364 01/31/2016     Recent Labs Lab 02/07/16 0404  NA 138  K 3.5  CL 91*  CO2 41*  BUN 25*  CREATININE 1.26*  CALCIUM 8.9  GLUCOSE 95   Lab Results  Component Value Date   CHOL 130 11/04/2015   HDL 54.50 11/04/2015   LDLCALC 60 11/04/2015   TRIG 77.0 11/04/2015   BNP (last 3 results)  Recent Labs  12/14/15 1337 01/26/16 1357 01/31/16 0240  BNP 1357.2* 973.7* 1297.3*    ProBNP (last 3 results)  Recent Labs  12/01/15 1052  PROBNP 1090.0*     Diagnostic Studies/Procedures   No results found.  Discharge Medications  Medication List    TAKE these medications        ALPRAZolam 0.5 MG tablet  Commonly known as:  XANAX  Take 1 tablet (0.5 mg total) by mouth 2 (two) times daily as needed for anxiety or sleep.     amiodarone 200 MG tablet  Commonly known as:  PACERONE  Take 1 tablet (200 mg total) by mouth 2 (two) times daily.     apixaban 2.5 MG Tabs tablet  Commonly known as:  ELIQUIS  Take 1 tablet (2.5 mg total) by mouth 2 (two) times daily.     bisoprolol 5 MG tablet  Commonly known as:  ZEBETA  Take 0.5 tablets (2.5 mg total) by mouth daily.     CALCIUM 600+D 600-400 MG-UNIT tablet   Generic drug:  Calcium Carbonate-Vitamin D  Take 1 tablet by mouth daily.     Fluticasone-Salmeterol 250-50 MCG/DOSE Aepb  Commonly known as:  ADVAIR DISKUS  Inhale 1 puff into the lungs 2 (two) times daily.     MULTIVITAMIN PO  Take 1 tablet by mouth daily.     spironolactone 25 MG tablet  Commonly known as:  ALDACTONE  Take 1 tablet (25 mg total) by mouth daily.     torsemide 20 MG tablet  Commonly known as:  DEMADEX  Take 2 tablets (40 mg total) by mouth 2 (two) times daily. Take 2 tablets (40mg ) every other day, alternating with 1 tablet (20mg ) every other day        Disposition   The patient will be discharged in stable condition to SNF Discharge Instructions    AMB Referral to [facility]    Complete by:  As directed   Reason for consult:  Post hospital follow up, active patient going to short term rehab facility,  Diagnoses of:  Heart Failure  Expected date of contact:  1-3 days (reserved for hospital discharges)  Please assign patient to social worker for post hospital follow up at skilled facility.  Patient already active with community nurse and pharmacist.  Questions please call:   Natividad Brood, RN BSN [facility] Liaison  [phone] business mobile phone Toll free office [phone]     Diet - low sodium heart healthy    Complete by:  As directed      Heart Failure patients record your daily weight using the same scale at the same time of day    Complete by:  As directed      Increase activity slowly    Complete by:  As directed           Follow-up Information    Follow up with [facility] .   Specialty:  [place] information:   [place] [phone]      Follow up with Glori Bickers, MD On 02/24/2016.   Specialty:  Cardiology   Why:  at 11:00 [place] information:   [place] [address] [phone]         Duration of Discharge Encounter: Greater than 35 minutes   Signed, Darrick Grinder NP-C  02/07/2016, 2:35 PM  Patient seen and examined with Darrick Grinder, NP. We discussed all aspects of the encounter. I agree with the assessment and plan as stated above.   Volume status much improved. Renal function and electrolytes stable. Can got to SNF today. Change furosemide to torsemide. Will f/u  next week in Clinic.  Bensimhon, Daniel,MD 3:23 PM

## 2016-02-07 NOTE — Progress Notes (Signed)
1600 transferred to [facility] via [place] [facility]  1621 Report given to [place] from [facility]

## 2016-02-07 NOTE — Clinical Social Work Note (Signed)
CSW continues to follow for discharge needs. Per NP, patient will likely discharge to [facility] in 24-48 ours. Facility notified.  Dayton Scrape, [place]

## 2016-02-07 NOTE — Clinical Social Work Note (Signed)
CSW facilitated patient discharge including contacting patient family and facility to confirm patient discharge plans. Clinical information faxed to facility and family agreeable with plan. CSW arranged ambulance transport via [facility] to [facility]. RN to call report prior to discharge [phone] Room [phone]).  CSW will sign off for now as social work intervention is no longer needed. Please consult [name] again if new needs arise.  Dayton Scrape, [place]

## 2016-02-07 NOTE — Care Management Important Message (Signed)
Important Message  Patient Details  Name: Mia Contreras MRN: [id number] Date of Birth: 10-27-1928   Medicare Important Message Given:  Yes    [name] 02/07/2016, 10:13 AM

## 2016-02-07 NOTE — Clinical Social Work Placement (Signed)
   CLINICAL SOCIAL WORK PLACEMENT  NOTE  Date:  02/07/2016  Patient Details  Name: Mia Contreras MRN: [id number] Date of Birth: 1929/08/19  Clinical Social Work is seeking post-discharge placement for this patient at the [place] level of care (*CSW will initial, date and re-position this form in  chart as items are completed):  Yes   Patient/family provided with [place] Work Department's list of facilities offering this level of care within the geographic area requested by the patient (or if unable, by the patient's family).  Yes   Patient/family informed of their freedom to choose among providers that offer the needed level of care, that participate in Medicare, Medicaid or managed care program needed by the patient, have an available bed and are willing to accept the patient.  Yes   Patient/family informed of [facility]'s ownership interest in [facility] and [facility], as well as of the fact that they are under no obligation to receive care at these facilities.  PASRR submitted to EDS on       PASRR number received on       Existing PASRR number confirmed on 01/28/16     FL2 transmitted to all facilities in geographic area requested by pt/family on 01/28/16     FL2 transmitted to all facilities within larger geographic area on       Patient informed that his/her managed care company has contracts with or will negotiate with certain facilities, including the following:        Yes   Patient/family informed of bed offers received.  Patient chooses bed at [facility]     Physician recommends and patient chooses bed at      Patient to be transferred to [facility] on 02/07/16.  Patient to be transferred to facility by [facility]     Patient family notified on 02/07/16 of transfer.  Name of family member notified:  Patient has been in frequent contact with her niece, [name].     PHYSICIAN        Additional Comment:    _______________________________________________ Candie Chroman, LCSW 02/07/2016, 3:26 PM

## 2016-02-07 NOTE — Progress Notes (Signed)
Advanced Heart Failure Rounding Note   Subjective:    Admitted 01/26/16 with volume overload. Diuresed well up until 01/29/16 and developed cough that worsened with chills and fever up to 102.  Pt was also hypotensive.  Cr on admit 2.92 improved to 1.5 with diuresis.  Cr elevated to 2.93 again on 01/31/16, likely due to hypotension.  Diuretics held and given gentle fluid rehydration.    Yesterday she continued to diurese with IV lasix. Negative 1 liter. Creatinine trending down.   Denies SOB.    Objective:   Weight Range:  Vital Signs:   Temp:  [98.5 F (36.9 C)-98.7 F (37.1 C)] 98.5 F (36.9 C) (06/18 2100) Pulse Rate:  [84-101] 101 (06/18 2100) Resp:  [20] 20 (06/18 2100) BP: (104-110)/(63-79) 110/79 mmHg (06/18 2100) SpO2:  [95 %-98 %] 98 % (06/18 2100) Weight:  [125 lb 9.6 oz (56.972 kg)] 125 lb 9.6 oz (56.972 kg) (06/19 0425) Last BM Date: 02/05/16  Weight change: Filed Weights   02/05/16 0629 02/06/16 0548 02/07/16 0425  Weight: 126 lb 6.4 oz (57.335 kg) 123 lb 9.6 oz (56.065 kg) 125 lb 9.6 oz (56.972 kg)    Intake/Output:   Intake/Output Summary (Last 24 hours) at 02/07/16 0924 Last data filed at 02/07/16 0837  Gross per 24 hour  Intake    843 ml  Output   2101 ml  Net  -1258 ml     PHYSICAL EXAM: General: Elderly. In the chair. NAD.  HEENT: normal Neck: supple. JVP ~7-8  Carotids 2+ bilat; no bruits. No thyromegaly or nodule noted.  Cor: PMI nondisplaced. Irregular rate & rhythm. No M/G/R. Lungs: Diminished basilar sounds Abdomen: soft, non-tender, no HSM. No bruits or masses. +BS  Extremities: no cyanosis, clubbing, rash. R and LLE wrapped with coban. Mild edema under wraps Neuro: alert & oriented x 3, cranial nerves grossly intact. moves all 4 extremities w/o difficulty. Affect pleasant.  TELE: Chronic AF 80-90  Labs: Basic Metabolic Panel:  Recent Labs Lab 02/03/16 0417 02/04/16 0519 02/05/16 0340 02/06/16 0239 02/07/16 0404  NA 137  138 139 139 138  K 3.5 3.2* 3.0* 2.4* 3.5  CL 97* 96* 90* 87* 91*  CO2 33* 34* 40* 44* 41*  GLUCOSE 80 80 87 79 95  BUN 40* 35* 30* 26* 25*  CREATININE 1.67* 1.62* 1.44* 1.34* 1.26*  CALCIUM 8.7* 8.8* 9.0 8.9 8.9    Liver Function Tests: No results for input(s): AST, ALT, ALKPHOS, BILITOT, PROT, ALBUMIN in the last 168 hours. No results for input(s): LIPASE, AMYLASE in the last 168 hours. No results for input(s): AMMONIA in the last 168 hours.  CBC: No results for input(s): WBC, NEUTROABS, HGB, HCT, MCV, PLT in the last 168 hours.  Cardiac Enzymes: No results for input(s): CKTOTAL, CKMB, CKMBINDEX, TROPONINI in the last 168 hours.  BNP: BNP (last 3 results)  Recent Labs  12/14/15 1337 01/26/16 1357 01/31/16 0240  BNP 1357.2* 973.7* 1297.3*    ProBNP (last 3 results)  Recent Labs  12/01/15 1052  PROBNP 1090.0*      Other results:  Imaging: No results found.   Medications:     Scheduled Medications: . amiodarone  100 mg Oral Daily  . apixaban  2.5 mg Oral BID  . bisoprolol  2.5 mg Oral Daily  . feeding supplement (ENSURE ENLIVE)  237 mL Oral BID BM  . furosemide  60 mg Intravenous BID  . levofloxacin  500 mg Oral Q48H  . multivitamin with  minerals  1 tablet Oral Daily  . potassium chloride  20 mEq Oral BID  . sodium chloride flush  3 mL Intravenous Q12H  . spironolactone  12.5 mg Oral Daily    Infusions:    PRN Medications: sodium chloride, acetaminophen, ALPRAZolam, guaiFENesin-dextromethorphan, ondansetron (ZOFRAN) IV, sodium chloride flush   Assessment:   1. Acute/Chronic systolic HF with R>>L symptoms. ECHO 11/2015 EF 45-50%.  NYHA III. Volume status elevated. Admit for IV diuresis.  2. AKI/CKD- Creatinine trending up. Suspect will improve with IV diuresis 3. LE wounds- Followed at the wound center. Ask woundcare to see in the hospital.  4. H/o COPD 5. H/o breast cancer s/p chemo 2000 6. Chronic A fib- Rate controlled. On Eliquis  twice a day.  This patients CHA2DS2-VASc Score and unadjusted Ischemic Stroke Rate (% per year) is equal to 7.2 % stroke rate/year from a score of 5 Above score calculated as 1 point each if present [CHF, HTN, DM, Vascular=MI/PAD/Aortic Plaque, Age if 65-74, or Female] Above score calculated as 2 points each if present [Age > 75, or Stroke/TIA/TE] 7. Falls- 2 falls this week. Live alone. Not safe to return home. Consult PT. 8. Acute respiratory failure with Hypoxia- O2 sat on arrival to [facility] was 81 %. Placed on 2 liters with O2 sat up to 90%.  9. HCAP  Plan/Discussion:    Now on levaquin for HCAP. No further fever. CXR 02/03/16 with left basilar infiltrates, otherwise clear  Volume status improving. Stop IV lasix. Start torsemide 40 mg twice a day. Increase spiro to 25 mg daily.   Continue anticoagulation. On eliquis 2.5 mg twice a daily.   PT following. Recommending SNF>   Disposition- To SNF 24-48 hours    Amy Clegg, NP-C    9:24 AM   Length of Stay: 12  Advanced Heart Failure Team Pager [phone] (M-F; 7a - 4p)  Please contact [facility] for night-coverage after hours (4p -7a ) and weekends on [email or web address]   Patient seen and examined with Darrick Grinder, NP. We discussed all aspects of the encounter. I agree with the assessment and plan as stated above.   She is improved. Ok for d/c to SNF today. See DC summary for further details.    Bensimhon, Daniel,MD 3:49 PM

## 2016-02-07 NOTE — Patient Outreach (Signed)
CSW made an initial attempt to try and contact patient on 02/04/16. CSW also left message for Malena Peer, SW at [facility] where patient is residing.  CSW will await callback and/or have CSW covering next week reach out again.  Eduard Clos, MSW, [place] Worker  [place] [phone]

## 2016-02-07 NOTE — Progress Notes (Signed)
Physical Therapy Treatment Patient Details Name: Mia Contreras MRN: [id number] DOB: August 06, 1929 Today's Date: 02/07/2016    History of Present Illness 82 y.o. female admitted to [facility] on 01/26/16 for acute on chronic CHF.  Pt with significant PMhx of recurrent falls with resultant bil LE wounds, anemia, HTN, venous insufficiency, h/o skin and breast CA, premature atrial contractions, COPD, Bil TKA, and L partial mastecotmy.    PT Comments    Progressing well, but still limited by falling sats and proximal hip weakness causing gait instability   Follow Up Recommendations  SNF     Equipment Recommendations  None recommended by PT    Recommendations for Other Services       Precautions / Restrictions Precautions Precautions: Fall Precaution Comments: pt with recent h/o recurrent falls at home Restrictions Weight Bearing Restrictions: No    Mobility  Bed Mobility                  Transfers Overall transfer level: Needs assistance Equipment used: Rolling walker (2 wheeled) Transfers: Sit to/from Stand Sit to Stand: Min guard         General transfer comment: cues for hand placement for safety  Ambulation/Gait Ambulation/Gait assistance: Min guard Ambulation Distance (Feet): 180 Feet Assistive device: Rolling walker (2 wheeled) Gait Pattern/deviations: Step-through pattern (R hip drop with increasing circumduction with fatigue) Gait velocity: moderate speed Gait velocity interpretation: at or above normal speed for age/gender General Gait Details: stability assist at times when fatigued   Stairs            Wheelchair Mobility    Modified Rankin (Stroke Patients Only)       Balance   Sitting-balance support: No upper extremity supported Sitting balance-Leahy Scale: Good     Standing balance support: Bilateral upper extremity supported;Single extremity supported Standing balance-Leahy Scale: Poor Standing balance comment: Relies on UE  support                    Cognition Arousal/Alertness: Awake/alert Behavior During Therapy: WFL for tasks assessed/performed Overall Cognitive Status: Within Functional Limits for tasks assessed                      Exercises General Exercises - Lower Extremity Ankle Circles/Pumps: AROM;20 reps;Seated Heel Slides: AROM;Strengthening;Right;Left;15 reps;Seated (resisted) Hip Flexion/Marching: AROM;Strengthening;20 reps;Seated Toe Raises: AROM;Strengthening;Both;15 reps;Seated ( ) Heel Raises: AROM;Strengthening;15 reps;Seated (resisted) Other Exercises Other Exercises:  NON -isolated resisted elbow flexion/extension x10    General Comments General comments (skin integrity, edema, etc.): consistently pt's sats drop to 87% on 2L at 110's bpm during gait, but she recovers to ~92% within a minute.      Pertinent Vitals/Pain Pain Assessment: Faces Faces Pain Scale: Hurts little more Pain Location: joints Pain Descriptors / Indicators: Aching Pain Intervention(s): Monitored during session    Home Living                      Prior Function            PT Goals (current goals can now be found in the care plan section) Acute Rehab PT Goals Patient Stated Goal: to go to rehab, preferably at [facility] and then go back home PT Goal Formulation: With patient Time For Goal Achievement: 02/10/16 Potential to Achieve Goals: Good Progress towards PT goals: Progressing toward goals    Frequency  Min 2X/week    PT Plan Current plan remains appropriate    Co-evaluation  End of Session Equipment Utilized During Treatment: Oxygen Activity Tolerance: Patient tolerated treatment well Patient left: in chair;with call bell/phone within reach;with chair alarm set     Time: [id number] PT Time Calculation (min) (ACUTE ONLY): 23 min  Charges:  $Gait Training: 8-22 mins $Therapeutic Exercise: 8-22 mins                    G Codes:       Davi Rotan, Tessie Fass 02/07/2016, 2:13 PM 02/07/2016  Donnella Sham, PT [phone]  (pager)

## 2016-02-08 ENCOUNTER — Telehealth (HOSPITAL_COMMUNITY): Payer: Self-pay | Admitting: *Deleted

## 2016-02-08 ENCOUNTER — Ambulatory Visit: Payer: Medicare Other

## 2016-02-08 DIAGNOSIS — I5023 Acute on chronic systolic (congestive) heart failure: Secondary | ICD-10-CM | POA: Diagnosis not present

## 2016-02-08 DIAGNOSIS — R296 Repeated falls: Secondary | ICD-10-CM | POA: Diagnosis not present

## 2016-02-08 DIAGNOSIS — N189 Chronic kidney disease, unspecified: Secondary | ICD-10-CM | POA: Diagnosis not present

## 2016-02-08 DIAGNOSIS — I482 Chronic atrial fibrillation: Secondary | ICD-10-CM | POA: Diagnosis not present

## 2016-02-08 DIAGNOSIS — J9601 Acute respiratory failure with hypoxia: Secondary | ICD-10-CM | POA: Diagnosis not present

## 2016-02-08 DIAGNOSIS — D649 Anemia, unspecified: Secondary | ICD-10-CM | POA: Diagnosis not present

## 2016-02-08 DIAGNOSIS — E0789 Other specified disorders of thyroid: Secondary | ICD-10-CM | POA: Diagnosis not present

## 2016-02-08 MED ORDER — TORSEMIDE 20 MG PO TABS: 40.0000 mg | ORAL_TABLET | Freq: Two times a day (BID) | ORAL | Status: DC

## 2016-02-08 NOTE — Telephone Encounter (Signed)
[name] called to confirm appt date and time for pt. Appt verified 02/24/16 11:20 am

## 2016-02-08 NOTE — Telephone Encounter (Signed)
Received message from [facility] needed clarification on pt's Torsemide, both the order they received and our chart state 2 different directions: 40 mg BID and 40 mg QOD alternating with 20 mg QOD.  Discussed w/Amy Ninfa Meeker, NP she advises pt should be on Torsemide 40 mg (2 tabs) BID, new order faxed to [facility], med list updated

## 2016-02-09 ENCOUNTER — Telehealth (HOSPITAL_COMMUNITY): Payer: Self-pay

## 2016-02-09 ENCOUNTER — Encounter: Payer: Self-pay | Admitting: *Deleted

## 2016-02-09 NOTE — Telephone Encounter (Signed)
Mia Contreras with [name] called CHF clinic to clarify Torsemide dose. Telephone note from [place] clarifying this with Amy Clegg NP-C from yesterday printed and faxed with clarified order for torsemide 40 mg twice daily per Amy Clegg NP-C to provided # [phone]  Renee Pain

## 2016-02-10 ENCOUNTER — Encounter: Payer: Self-pay | Admitting: *Deleted

## 2016-02-10 ENCOUNTER — Other Ambulatory Visit: Payer: Self-pay | Admitting: *Deleted

## 2016-02-10 DIAGNOSIS — M1990 Unspecified osteoarthritis, unspecified site: Secondary | ICD-10-CM | POA: Diagnosis not present

## 2016-02-10 DIAGNOSIS — L97821 Non-pressure chronic ulcer of other part of left lower leg limited to breakdown of skin: Secondary | ICD-10-CM | POA: Diagnosis not present

## 2016-02-10 DIAGNOSIS — I87311 Chronic venous hypertension (idiopathic) with ulcer of right lower extremity: Secondary | ICD-10-CM | POA: Diagnosis not present

## 2016-02-10 DIAGNOSIS — I509 Heart failure, unspecified: Secondary | ICD-10-CM | POA: Diagnosis not present

## 2016-02-10 DIAGNOSIS — I890 Lymphedema, not elsewhere classified: Secondary | ICD-10-CM | POA: Diagnosis not present

## 2016-02-10 DIAGNOSIS — L97811 Non-pressure chronic ulcer of other part of right lower leg limited to breakdown of skin: Secondary | ICD-10-CM | POA: Diagnosis not present

## 2016-02-10 DIAGNOSIS — J449 Chronic obstructive pulmonary disease, unspecified: Secondary | ICD-10-CM | POA: Diagnosis not present

## 2016-02-10 NOTE — Patient Outreach (Signed)
[facility]) Care Management  02/10/2016  Mia Contreras 04-24-1929 [id number]   CSW was able to make contact with patient today to perform the initial assessment, as well as assess and assist with social work needs and services, when Gem met with patient at [facility] [place] where patient currently resides to receive short-term rehabilitative services.  CSW introduced self, explained role and types of services provided through [facility] ([facility]).  CSW further explained to patient that CSW works with patient's RNCM, also with [facility], Thea Silversmith. CSW then explained the reason for the call, indicating that Mrs. Mia Contreras thought that patient would benefit from social work services and resources to assist with possible discharge planning needs and services from the skilled nursing facility.  CSW obtained two HIPAA compliant identifiers from patient, which included patient's name and date of birth. Patient admits that she is progressing well with therapies, both physical and occupational, but that she is somewhat limited due to weakness in her hip and shortness of breath.  Patient reports feeling weak, tired and just rundown, probably a result of recent hospitalization and symptoms associated with her diagnosis of Congestive Heart Failure.  Patient endorses that she plans to return home to live alone at time of discharge from the skilled facility.  Patient is agreeable to receiving home health services, as well as durable medical equipment, if recommended and deemed beneficial.  CSW agreed to continue to follow patient while at the skilled facility to assess and assist with discharge planning needs and services.  CSW attempted to contact Malena Peer, Licensed Clinical Social Worker/Admissions Coordinator at [facility], to discuss discharge plans for patient; however, she was  unavailable.  A HIPAA compliant message was left on voicemail.  CSW is currently awaiting a return call. Nat Christen, BSW, MSW, LCSW  Licensed Education officer, environmental Health System  Mailing [place] [address], [place], [place] [address] Physical Address-[address], [place], [place] [address] Toll Free Main # [phone] Fax # [phone] Cell # [phone]  Fax # [phone]  [email or web address]

## 2016-02-17 DIAGNOSIS — L97811 Non-pressure chronic ulcer of other part of right lower leg limited to breakdown of skin: Secondary | ICD-10-CM | POA: Diagnosis not present

## 2016-02-17 DIAGNOSIS — L97821 Non-pressure chronic ulcer of other part of left lower leg limited to breakdown of skin: Secondary | ICD-10-CM | POA: Diagnosis not present

## 2016-02-17 DIAGNOSIS — I87331 Chronic venous hypertension (idiopathic) with ulcer and inflammation of right lower extremity: Secondary | ICD-10-CM | POA: Diagnosis not present

## 2016-02-17 DIAGNOSIS — S81802A Unspecified open wound, left lower leg, initial encounter: Secondary | ICD-10-CM | POA: Diagnosis not present

## 2016-02-17 DIAGNOSIS — J449 Chronic obstructive pulmonary disease, unspecified: Secondary | ICD-10-CM | POA: Diagnosis not present

## 2016-02-17 DIAGNOSIS — I509 Heart failure, unspecified: Secondary | ICD-10-CM | POA: Diagnosis not present

## 2016-02-17 DIAGNOSIS — I890 Lymphedema, not elsewhere classified: Secondary | ICD-10-CM | POA: Diagnosis not present

## 2016-02-17 DIAGNOSIS — M1990 Unspecified osteoarthritis, unspecified site: Secondary | ICD-10-CM | POA: Diagnosis not present

## 2016-02-18 DIAGNOSIS — N2581 Secondary hyperparathyroidism of renal origin: Secondary | ICD-10-CM | POA: Diagnosis not present

## 2016-02-18 DIAGNOSIS — D631 Anemia in chronic kidney disease: Secondary | ICD-10-CM | POA: Diagnosis not present

## 2016-02-18 DIAGNOSIS — N184 Chronic kidney disease, stage 4 (severe): Secondary | ICD-10-CM | POA: Diagnosis not present

## 2016-02-24 ENCOUNTER — Encounter (HOSPITAL_BASED_OUTPATIENT_CLINIC_OR_DEPARTMENT_OTHER): Payer: Medicare Other | Attending: Internal Medicine

## 2016-02-24 ENCOUNTER — Encounter (HOSPITAL_COMMUNITY): Payer: Self-pay | Admitting: Internal Medicine

## 2016-02-24 ENCOUNTER — Other Ambulatory Visit: Payer: Self-pay | Admitting: *Deleted

## 2016-02-24 ENCOUNTER — Ambulatory Visit (HOSPITAL_COMMUNITY)
Admit: 2016-02-24 | Discharge: 2016-02-24 | Disposition: A | Discharge status: Home / Self Care | Payer: No Typology Code available for payment source | Attending: Internal Medicine | Admitting: Internal Medicine

## 2016-02-24 VITALS — BP 114/60 | HR 62 | Wt 120.5 lb

## 2016-02-24 DIAGNOSIS — L97221 Non-pressure chronic ulcer of left calf limited to breakdown of skin: Secondary | ICD-10-CM | POA: Insufficient documentation

## 2016-02-24 DIAGNOSIS — I87331 Chronic venous hypertension (idiopathic) with ulcer and inflammation of right lower extremity: Secondary | ICD-10-CM | POA: Diagnosis not present

## 2016-02-24 DIAGNOSIS — I272 Other secondary pulmonary hypertension: Secondary | ICD-10-CM | POA: Insufficient documentation

## 2016-02-24 DIAGNOSIS — I130 Hypertensive heart and chronic kidney disease with heart failure and stage 1 through stage 4 chronic kidney disease, or unspecified chronic kidney disease: Secondary | ICD-10-CM | POA: Insufficient documentation

## 2016-02-24 DIAGNOSIS — J449 Chronic obstructive pulmonary disease, unspecified: Secondary | ICD-10-CM | POA: Diagnosis not present

## 2016-02-24 DIAGNOSIS — W228XXA Striking against or struck by other objects, initial encounter: Secondary | ICD-10-CM | POA: Insufficient documentation

## 2016-02-24 DIAGNOSIS — D649 Anemia, unspecified: Secondary | ICD-10-CM | POA: Insufficient documentation

## 2016-02-24 DIAGNOSIS — E785 Hyperlipidemia, unspecified: Secondary | ICD-10-CM | POA: Insufficient documentation

## 2016-02-24 DIAGNOSIS — Z853 Personal history of malignant neoplasm of breast: Secondary | ICD-10-CM | POA: Insufficient documentation

## 2016-02-24 DIAGNOSIS — I482 Chronic atrial fibrillation, unspecified: Secondary | ICD-10-CM

## 2016-02-24 DIAGNOSIS — Z85828 Personal history of other malignant neoplasm of skin: Secondary | ICD-10-CM | POA: Insufficient documentation

## 2016-02-24 DIAGNOSIS — Z87891 Personal history of nicotine dependence: Secondary | ICD-10-CM | POA: Insufficient documentation

## 2016-02-24 DIAGNOSIS — I110 Hypertensive heart disease with heart failure: Secondary | ICD-10-CM | POA: Insufficient documentation

## 2016-02-24 DIAGNOSIS — S60512A Abrasion of left hand, initial encounter: Secondary | ICD-10-CM | POA: Diagnosis not present

## 2016-02-24 DIAGNOSIS — I5033 Acute on chronic diastolic (congestive) heart failure: Secondary | ICD-10-CM | POA: Diagnosis not present

## 2016-02-24 DIAGNOSIS — L97211 Non-pressure chronic ulcer of right calf limited to breakdown of skin: Secondary | ICD-10-CM | POA: Diagnosis not present

## 2016-02-24 DIAGNOSIS — Z7901 Long term (current) use of anticoagulants: Secondary | ICD-10-CM | POA: Insufficient documentation

## 2016-02-24 DIAGNOSIS — I5022 Chronic systolic (congestive) heart failure: Secondary | ICD-10-CM | POA: Insufficient documentation

## 2016-02-24 DIAGNOSIS — I509 Heart failure, unspecified: Secondary | ICD-10-CM | POA: Insufficient documentation

## 2016-02-24 DIAGNOSIS — M1990 Unspecified osteoarthritis, unspecified site: Secondary | ICD-10-CM | POA: Insufficient documentation

## 2016-02-24 DIAGNOSIS — Z9221 Personal history of antineoplastic chemotherapy: Secondary | ICD-10-CM | POA: Insufficient documentation

## 2016-02-24 DIAGNOSIS — G5600 Carpal tunnel syndrome, unspecified upper limb: Secondary | ICD-10-CM | POA: Insufficient documentation

## 2016-02-24 DIAGNOSIS — N179 Acute kidney failure, unspecified: Secondary | ICD-10-CM | POA: Diagnosis not present

## 2016-02-24 DIAGNOSIS — K589 Irritable bowel syndrome without diarrhea: Secondary | ICD-10-CM | POA: Insufficient documentation

## 2016-02-24 DIAGNOSIS — I429 Cardiomyopathy, unspecified: Secondary | ICD-10-CM | POA: Insufficient documentation

## 2016-02-24 DIAGNOSIS — L97811 Non-pressure chronic ulcer of other part of right lower leg limited to breakdown of skin: Secondary | ICD-10-CM | POA: Diagnosis not present

## 2016-02-24 DIAGNOSIS — S81802A Unspecified open wound, left lower leg, initial encounter: Secondary | ICD-10-CM | POA: Diagnosis not present

## 2016-02-24 DIAGNOSIS — F419 Anxiety disorder, unspecified: Secondary | ICD-10-CM | POA: Insufficient documentation

## 2016-02-24 DIAGNOSIS — I87311 Chronic venous hypertension (idiopathic) with ulcer of right lower extremity: Secondary | ICD-10-CM | POA: Diagnosis not present

## 2016-02-24 DIAGNOSIS — E0590 Thyrotoxicosis, unspecified without thyrotoxic crisis or storm: Secondary | ICD-10-CM | POA: Insufficient documentation

## 2016-02-24 DIAGNOSIS — I491 Atrial premature depolarization: Secondary | ICD-10-CM | POA: Insufficient documentation

## 2016-02-24 DIAGNOSIS — Z923 Personal history of irradiation: Secondary | ICD-10-CM | POA: Insufficient documentation

## 2016-02-24 LAB — BASIC METABOLIC PANEL
Anion gap: 8 (ref 5–15)
BUN: 39 mg/dL — AB (ref 6–20)
CHLORIDE: 102 mmol/L (ref 101–111)
CO2: 29 mmol/L (ref 22–32)
Calcium: 9.4 mg/dL (ref 8.9–10.3)
Creatinine, Ser: 2.58 mg/dL — ABNORMAL HIGH (ref 0.44–1.00)
GFR, EST AFRICAN AMERICAN: 19 mL/min — AB (ref >60)
GFR, EST NON AFRICAN AMERICAN: 16 mL/min — AB (ref >60)
Glucose, Bld: 80 mg/dL (ref 65–99)
POTASSIUM: 4.2 mmol/L (ref 3.5–5.1)
SODIUM: 139 mmol/L (ref 135–145)

## 2016-02-24 NOTE — Patient Instructions (Signed)
Routine lab work today. Will notify you of abnormal results  Ok to use oxygen for exertion and sleep. Can also use PRN. (goal pulse ox >90%)  Follow up in 6-8 weeks

## 2016-02-24 NOTE — Progress Notes (Signed)
Patient ID: Mia Contreras, female   DOB: 09-19-1928, 82 y.o.   MRN: [id number]    Advanced Heart Failure Clinic Note   Referring Physician: Dr Johnsie Cancel Primary Care: Arlana Lindau, MD Primary Cardiologist: Dr Johnsie Cancel Primary HF: Dr. Haroldine Laws  HPI: Mia Contreras is a 82 y.o. female with a history of nonischemic cardiomyopathy, systolic CHF, PACs, L Breast cancer in 2000 with surgery and chemo.  She has never had a cath. Had 4 rounds of chemo for breast CA. Doesn't remember which agents she got. Does not recall a red medicine.   Admitted 4/25 -12/19/15 with A/C combined CHF and AKI. Creatinine improved.  She diuresed 12 L and down 19 lbs from highest weight that admission. Discharge weight 134 lbs.   Admitted 6/7 -> 02/07/16 with volume overload. Diuresed 10 lbs on IV lasix and transitioned to torsemide for home. Completed course of levaquin for left basilar infiltrates on CXR.  Was discharged to [facility] due to frequent falls at home. Discharge weight 125 lbs.   She presents today for post hospital follow up. Weight down 5 lbs from discharge with switch to torsemide.  She remains at [facility]. No further overt falls, although she did slip out of a chair last week.  Continues to work with PT and wears oxygen during that time.  O2 in 96-98% range. Denies SOB/PND/Orthopnea. Facility is managing dressing changes and she sees wound doctor today.   11/2015 ECHO EF 45-50%  12/16/14  ECHO Normal LV size with EF 30-35%, diffuse hypokinesis. Normal RVsize with mildly decreased systolic function. Moderate to severeLAE. Mild MR. Moderate pulmonary hypertension.  Recent labs: 12/01/15: Sodium 140 K 5.0 creatinine 2.5 (was 1.3 in 3/17). Albumin 3.8 BNP 1090 12/19/15: K 4.0, Creatinine 1.67 12/28/15: K 5.4, Creatinine 2.26 12/31/15 K 4.8, creatinine 2.49 01/07/2016 K 4.9 Creatinine 2.93  02/07/16 K 3.5, Creatinine 1.26  FH: Brother also has CAD and afib, Mother had CABG but did well, Father passed  away from MI at age 64 thought to be 2/2 to heavy ETOH use.  SH:  Pt has a distant history of smoking. Pt has 50-60 pack years up to 2ppd. Quit in 1982.   Past Medical History  Diagnosis Date  . GOITER, MULTINODULAR 05/06/2010    Dr [name]  . HYPERTHYROIDISM 05/23/2010  . HYPERLIPIDEMIA 08/20/2007  . ANEMIA-NOS 08/20/2007  . ANXIETY 03/16/2007  . CARPAL TUNNEL SYNDROME, RIGHT 01/11/2008  . HYPERTENSION 03/16/2007  . VENOUS INSUFFICIENCY 09/05/2007  . DIVERTICULOSIS, COLON 08/20/2007  . CELLULITIS, LEG, RIGHT 08/20/2007  . OSTEOARTHRITIS 03/16/2007  . OSTEOPOROSIS 08/20/2007  . SCOLIOSIS 01/11/2008  . BREAST CANCER, HX OF 03/16/2007  . SKIN CANCER, HX OF 11/12/2008     R cheek 2010, L Cheek 2011 Dr. Tonia Brooms  . PREMATURE ATRIAL CONTRACTIONS 09/21/2010  . Thyroid nodule   . Cataract     floaters  . Pneumonia 07/2011    took abx for several weeks  . IBS (irritable bowel syndrome)   . Collagenous colitis   . COPD (chronic obstructive pulmonary disease) ([place])   . AKI (acute kidney injury) ([place]) 01/2016    Current Outpatient Prescriptions  Medication Sig Dispense Refill  . ALPRAZolam (XANAX) 0.5 MG tablet Take 1 tablet (0.5 mg total) by mouth 2 (two) times daily as needed for anxiety or sleep. 60 tablet 0  . amiodarone (PACERONE) 200 MG tablet Take 1 tablet (200 mg total) by mouth 2 (two) times daily. 60 tablet 0  . apixaban (ELIQUIS) 2.5 MG TABS  tablet Take 1 tablet (2.5 mg total) by mouth 2 (two) times daily. 180 tablet 3  . bisoprolol (ZEBETA) 5 MG tablet Take 0.5 tablets (2.5 mg total) by mouth daily.    . Calcium Carbonate-Vitamin D (CALCIUM 600+D) 600-400 MG-UNIT per tablet Take 1 tablet by mouth daily.    . Fluticasone-Salmeterol (ADVAIR DISKUS) 250-50 MCG/DOSE AEPB Inhale 1 puff into the lungs 2 (two) times daily. 60 each 3  . Multiple Vitamins-Minerals (MULTIVITAMIN PO) Take 1 tablet by mouth daily.     . potassium chloride SA (K-DUR,KLOR-CON) 20 MEQ tablet Take 1 tablet (20 mEq  total) by mouth 2 (two) times daily.    Marland Kitchen spironolactone (ALDACTONE) 25 MG tablet Take 1 tablet (25 mg total) by mouth daily. 30 tablet 6  . torsemide (DEMADEX) 20 MG tablet Take 2 tablets (40 mg total) by mouth 2 (two) times daily. 120 tablet 6   No current facility-administered medications for this encounter.    Allergies  Allergen Reactions  . Cefuroxime Diarrhea  . Celecoxib Nausea Only  . Deltasone [Prednisone]     Oral deltasone is causing swelling (can use Depo-Medrol)  . Ranitidine     bloating  . Tramadol Hcl Nausea Only      Social History   Social History  . Marital Status: Single    Spouse Name: N/A  . Number of Children: N/A  . Years of Education: N/A   Occupational History  . Not on file.   Social History Main Topics  . Smoking status: Former Smoker    Quit date: 12/20/1980  . Smokeless tobacco: Never Used  . Alcohol Use: Yes     Comment: socially  . Drug Use: No  . Sexual Activity: Not Currently   Other Topics Concern  . Not on file   Social History Narrative      Family History  Problem Relation Age of Onset  . Dementia Mother   . Heart disease Mother   . Mental retardation Mother   . Colon cancer Neg Hx   . Hypertension Mother   . Alzheimer's disease Mother   . Prostate cancer Brother   . Diabetes Other   . Heart attack Father   . Alcohol abuse Father   . Anesthesia problems Neg Hx     Filed Vitals:   02/24/16 1149  BP: 114/60  Pulse: 62  Weight: 120 lb 8 oz (54.658 kg)  SpO2: 96%   Wt Readings from Last 3 Encounters:  02/24/16 120 lb 8 oz (54.658 kg)  02/07/16 125 lb 9.6 oz (56.972 kg)  01/26/16 129 lb 9.6 oz (58.786 kg)    PHYSICAL EXAM: General:  Elderly. Sitting in [place]. NAD. Sitter present. Marland Kitchen  HEENT: normal Neck: supple. JVP 6-7  Carotids 2+ bilat; no bruits. No thyromegaly or nodule noted.  Cor: PMI nondisplaced. Irregular rate & rhythm. No M/G/R Lungs: Clear, normal effort Abdomen: soft, NT, ND, no HSM. No bruits  or masses. +BS  Extremities: no cyanosis, clubbing, rash. Legs wrapped in Coban, Trace-1+ edema intro knees/thighs above leg wraps.  Neuro: alert & oriented x 3, cranial nerves grossly intact. moves all 4 extremities w/o difficulty. Affect pleasant.  ASSESSMENT & PLAN: 1. Chronic systolic HF with R>>L symptoms. ECHO 11/2015 EF 45-50%.  NYHA III. 2. AKI on CKD stage III 3. LE wounds- Followed at the wound center.  4.  H/o COPD 5. H/o breast cancer s/p chemo 2000 6. Chronic A fib- Rate controlled. On Eliquis twice a day.  -  This patients CHA2DS2-VASc Score is 6. 7. Falls- Remains in SNF - [name]   Renal function had improved by end of recent hospitalization. Recheck BMET today.   Shirley Friar, PA-C  02/24/2016    Patient seen and examined with Oda Kilts, PA-C. We discussed all aspects of the encounter. I agree with the assessment and plan as stated above.   Volume status is much improved on torsemide. Will continue current regimen. AF is rate controlled. Continue Eliquis. Check labs today. Continue wound care.   Marbin Olshefski,MD 12:23 AM

## 2016-02-24 NOTE — Patient Outreach (Signed)
[facility]) Care Management  02/24/2016  Twana Kubek Mehan [birth year] [id number]   CSW was able to meet with patient today at [facility] [place] where patient currently resides to receive short-term rehabilitative services.  Patient did not have any visitors present at the time of CSW's arrival.  Patient had just returned from her post-hospital follow-up appointment with Barrington Ellison, PA-C at the [facility] and was trying to relax prior to receiving her lunch tray. Patient reported that the appointment went well, but that she is exhausted from all the activity.  Patient is optimistic that she will be able to return home soon.  Patient indicated that home health arrangements have already been made for her through an agency of choice, but she is still awaiting recommendations from the physical therapist as to whether or not she will need durable medical equipment for home use.  CSW agreed to follow-up with patient in less than two weeks to assess and assist with possible discharge planning needs and services. Nat Christen, BSW, MSW, LCSW  Licensed Education officer, environmental Health System  Mailing [place] [address] [place], [place] [address] Physical Address-[address], [place], [place] [address] Toll Free Main # [phone] Fax # [phone] Cell # [phone]  Fax # [phone]  [email or web address]

## 2016-02-25 DIAGNOSIS — S81812D Laceration without foreign body, left lower leg, subsequent encounter: Secondary | ICD-10-CM | POA: Diagnosis not present

## 2016-02-25 DIAGNOSIS — R2689 Other abnormalities of gait and mobility: Secondary | ICD-10-CM | POA: Diagnosis not present

## 2016-02-25 DIAGNOSIS — J449 Chronic obstructive pulmonary disease, unspecified: Secondary | ICD-10-CM | POA: Diagnosis not present

## 2016-02-25 DIAGNOSIS — N189 Chronic kidney disease, unspecified: Secondary | ICD-10-CM | POA: Diagnosis not present

## 2016-02-25 DIAGNOSIS — J9601 Acute respiratory failure with hypoxia: Secondary | ICD-10-CM | POA: Diagnosis not present

## 2016-02-25 DIAGNOSIS — I5023 Acute on chronic systolic (congestive) heart failure: Secondary | ICD-10-CM | POA: Diagnosis not present

## 2016-02-25 DIAGNOSIS — I482 Chronic atrial fibrillation: Secondary | ICD-10-CM | POA: Diagnosis not present

## 2016-03-02 DIAGNOSIS — J449 Chronic obstructive pulmonary disease, unspecified: Secondary | ICD-10-CM | POA: Diagnosis not present

## 2016-03-02 DIAGNOSIS — S81812A Laceration without foreign body, left lower leg, initial encounter: Secondary | ICD-10-CM | POA: Diagnosis not present

## 2016-03-02 DIAGNOSIS — I110 Hypertensive heart disease with heart failure: Secondary | ICD-10-CM | POA: Diagnosis not present

## 2016-03-02 DIAGNOSIS — S60512A Abrasion of left hand, initial encounter: Secondary | ICD-10-CM | POA: Diagnosis not present

## 2016-03-02 DIAGNOSIS — L97221 Non-pressure chronic ulcer of left calf limited to breakdown of skin: Secondary | ICD-10-CM | POA: Diagnosis not present

## 2016-03-02 DIAGNOSIS — I87331 Chronic venous hypertension (idiopathic) with ulcer and inflammation of right lower extremity: Secondary | ICD-10-CM | POA: Diagnosis not present

## 2016-03-02 DIAGNOSIS — L97211 Non-pressure chronic ulcer of right calf limited to breakdown of skin: Secondary | ICD-10-CM | POA: Diagnosis not present

## 2016-03-04 DIAGNOSIS — K5790 Diverticulosis of intestine, part unspecified, without perforation or abscess without bleeding: Secondary | ICD-10-CM | POA: Diagnosis not present

## 2016-03-04 DIAGNOSIS — I5020 Unspecified systolic (congestive) heart failure: Secondary | ICD-10-CM | POA: Diagnosis not present

## 2016-03-04 DIAGNOSIS — I482 Chronic atrial fibrillation: Secondary | ICD-10-CM | POA: Diagnosis not present

## 2016-03-04 DIAGNOSIS — I130 Hypertensive heart and chronic kidney disease with heart failure and stage 1 through stage 4 chronic kidney disease, or unspecified chronic kidney disease: Secondary | ICD-10-CM | POA: Diagnosis not present

## 2016-03-04 DIAGNOSIS — Z8701 Personal history of pneumonia (recurrent): Secondary | ICD-10-CM | POA: Diagnosis not present

## 2016-03-04 DIAGNOSIS — I872 Venous insufficiency (chronic) (peripheral): Secondary | ICD-10-CM | POA: Diagnosis not present

## 2016-03-04 DIAGNOSIS — Z853 Personal history of malignant neoplasm of breast: Secondary | ICD-10-CM | POA: Diagnosis not present

## 2016-03-04 DIAGNOSIS — M150 Primary generalized (osteo)arthritis: Secondary | ICD-10-CM | POA: Diagnosis not present

## 2016-03-04 DIAGNOSIS — L03115 Cellulitis of right lower limb: Secondary | ICD-10-CM | POA: Diagnosis not present

## 2016-03-04 DIAGNOSIS — I429 Cardiomyopathy, unspecified: Secondary | ICD-10-CM | POA: Diagnosis not present

## 2016-03-04 DIAGNOSIS — N183 Chronic kidney disease, stage 3 (moderate): Secondary | ICD-10-CM | POA: Diagnosis not present

## 2016-03-04 DIAGNOSIS — J449 Chronic obstructive pulmonary disease, unspecified: Secondary | ICD-10-CM | POA: Diagnosis not present

## 2016-03-04 DIAGNOSIS — M810 Age-related osteoporosis without current pathological fracture: Secondary | ICD-10-CM | POA: Diagnosis not present

## 2016-03-06 DIAGNOSIS — I429 Cardiomyopathy, unspecified: Secondary | ICD-10-CM | POA: Diagnosis not present

## 2016-03-06 DIAGNOSIS — I482 Chronic atrial fibrillation: Secondary | ICD-10-CM | POA: Diagnosis not present

## 2016-03-06 DIAGNOSIS — I130 Hypertensive heart and chronic kidney disease with heart failure and stage 1 through stage 4 chronic kidney disease, or unspecified chronic kidney disease: Secondary | ICD-10-CM | POA: Diagnosis not present

## 2016-03-06 DIAGNOSIS — I5020 Unspecified systolic (congestive) heart failure: Secondary | ICD-10-CM | POA: Diagnosis not present

## 2016-03-06 DIAGNOSIS — L03115 Cellulitis of right lower limb: Secondary | ICD-10-CM | POA: Diagnosis not present

## 2016-03-06 DIAGNOSIS — N183 Chronic kidney disease, stage 3 (moderate): Secondary | ICD-10-CM | POA: Diagnosis not present

## 2016-03-07 ENCOUNTER — Other Ambulatory Visit: Payer: Self-pay | Admitting: *Deleted

## 2016-03-07 DIAGNOSIS — I482 Chronic atrial fibrillation: Secondary | ICD-10-CM | POA: Diagnosis not present

## 2016-03-07 DIAGNOSIS — I429 Cardiomyopathy, unspecified: Secondary | ICD-10-CM | POA: Diagnosis not present

## 2016-03-07 DIAGNOSIS — I5020 Unspecified systolic (congestive) heart failure: Secondary | ICD-10-CM | POA: Diagnosis not present

## 2016-03-07 DIAGNOSIS — L03115 Cellulitis of right lower limb: Secondary | ICD-10-CM | POA: Diagnosis not present

## 2016-03-07 DIAGNOSIS — I130 Hypertensive heart and chronic kidney disease with heart failure and stage 1 through stage 4 chronic kidney disease, or unspecified chronic kidney disease: Secondary | ICD-10-CM | POA: Diagnosis not present

## 2016-03-07 DIAGNOSIS — N183 Chronic kidney disease, stage 3 (moderate): Secondary | ICD-10-CM | POA: Diagnosis not present

## 2016-03-07 NOTE — Patient Outreach (Signed)
[facility]) Care Management  03/07/2016  Mia Contreras 1929/03/19 [id number]   CSW drove out to [facility] where patient was residing to receive short-term rehabilitative services, only to find that patient was no longer residing there.  CSW was informed by the receptionist at the facility that patient had already been discharged.  CSW was then able to make contact with patient by phone at her home.  CSW confirmed with patient that all home health services and durable medical equipment had already been arranged and that no additional social work needs have been identified.   CSW will perform a case closure on patient, as all goals of treatment have been met from social work standpoint and no additional social work needs have been identified at this time.  CSW will notify patient's RNCM with [facility], Thea Silversmith of CSW's plans to close patient's case.  CSW will fax an update to patient's Primary Care Physician, Dr.  Tyrone Apple Plotnikov to ensure that they are aware of CSW's involvement with patient's plan of care.  CSW will submit a case closure request to Verlon Setting, Care Management Assistant with [facility], in the form of an [employer].  CSW will ensure that Mia Contreras is aware of Collier Flowers, RNCM with [facility], continued involvement with patient's care. Nat Christen, BSW, MSW, LCSW  Licensed Education officer, environmental Health System  Mailing [place] [address] [place], [place] [address] Physical Address-[address], [place], [place] [address] Toll Free Main # [phone] Fax # [phone] Cell # [phone]  Fax # [phone]  [email or web address]

## 2016-03-08 DIAGNOSIS — I482 Chronic atrial fibrillation: Secondary | ICD-10-CM | POA: Diagnosis not present

## 2016-03-08 DIAGNOSIS — I5020 Unspecified systolic (congestive) heart failure: Secondary | ICD-10-CM | POA: Diagnosis not present

## 2016-03-08 DIAGNOSIS — I130 Hypertensive heart and chronic kidney disease with heart failure and stage 1 through stage 4 chronic kidney disease, or unspecified chronic kidney disease: Secondary | ICD-10-CM | POA: Diagnosis not present

## 2016-03-08 DIAGNOSIS — N183 Chronic kidney disease, stage 3 (moderate): Secondary | ICD-10-CM | POA: Diagnosis not present

## 2016-03-08 DIAGNOSIS — L03115 Cellulitis of right lower limb: Secondary | ICD-10-CM | POA: Diagnosis not present

## 2016-03-08 DIAGNOSIS — I429 Cardiomyopathy, unspecified: Secondary | ICD-10-CM | POA: Diagnosis not present

## 2016-03-09 ENCOUNTER — Other Ambulatory Visit: Payer: Self-pay

## 2016-03-09 DIAGNOSIS — S81801A Unspecified open wound, right lower leg, initial encounter: Secondary | ICD-10-CM | POA: Diagnosis not present

## 2016-03-09 DIAGNOSIS — L89891 Pressure ulcer of other site, stage 1: Secondary | ICD-10-CM | POA: Diagnosis not present

## 2016-03-09 DIAGNOSIS — I87331 Chronic venous hypertension (idiopathic) with ulcer and inflammation of right lower extremity: Secondary | ICD-10-CM | POA: Diagnosis not present

## 2016-03-09 DIAGNOSIS — I110 Hypertensive heart disease with heart failure: Secondary | ICD-10-CM | POA: Diagnosis not present

## 2016-03-09 DIAGNOSIS — L97221 Non-pressure chronic ulcer of left calf limited to breakdown of skin: Secondary | ICD-10-CM | POA: Diagnosis not present

## 2016-03-09 DIAGNOSIS — S61412A Laceration without foreign body of left hand, initial encounter: Secondary | ICD-10-CM | POA: Diagnosis not present

## 2016-03-09 DIAGNOSIS — J449 Chronic obstructive pulmonary disease, unspecified: Secondary | ICD-10-CM | POA: Diagnosis not present

## 2016-03-09 DIAGNOSIS — S60512A Abrasion of left hand, initial encounter: Secondary | ICD-10-CM | POA: Diagnosis not present

## 2016-03-09 DIAGNOSIS — L97211 Non-pressure chronic ulcer of right calf limited to breakdown of skin: Secondary | ICD-10-CM | POA: Diagnosis not present

## 2016-03-09 NOTE — Telephone Encounter (Signed)
Patient has follow-up appointment in September.

## 2016-03-09 NOTE — Patient Outreach (Signed)
[facility]) Care Management  03/09/2016  Mia Contreras 11/16/1928 [id number]   Assessment: 81 year old with recent admission to hospital due to heart failure followed by stay at [facility]. Member reports she is doing well. Member states she went to Wound center today and was released her doctor. Upcoming appointments discussed. Member is active with home health and verbalzed knowledge of how to contact if needed. Member has home health physical therapy also and states occupational therapy has released her.  Medications reviewed. Member reports she has all the medications prescribed.   RNCM discussed home visit. Member declines at this time, reporting that she is overwhelmed with provider visits and home health visits, but is agreeable to follow up telephonically.  Plan: follow up telephonically next week.  Thea Silversmith, RN, MSN, [place] Coordinator Cell: [phone]

## 2016-03-11 ENCOUNTER — Other Ambulatory Visit: Payer: Self-pay | Admitting: Cardiovascular Disease

## 2016-03-13 ENCOUNTER — Telehealth: Payer: Self-pay

## 2016-03-13 DIAGNOSIS — I5020 Unspecified systolic (congestive) heart failure: Secondary | ICD-10-CM | POA: Diagnosis not present

## 2016-03-13 DIAGNOSIS — I482 Chronic atrial fibrillation: Secondary | ICD-10-CM | POA: Diagnosis not present

## 2016-03-13 DIAGNOSIS — L03115 Cellulitis of right lower limb: Secondary | ICD-10-CM | POA: Diagnosis not present

## 2016-03-13 DIAGNOSIS — I429 Cardiomyopathy, unspecified: Secondary | ICD-10-CM | POA: Diagnosis not present

## 2016-03-13 DIAGNOSIS — N183 Chronic kidney disease, stage 3 (moderate): Secondary | ICD-10-CM | POA: Diagnosis not present

## 2016-03-13 DIAGNOSIS — I130 Hypertensive heart and chronic kidney disease with heart failure and stage 1 through stage 4 chronic kidney disease, or unspecified chronic kidney disease: Secondary | ICD-10-CM | POA: Diagnosis not present

## 2016-03-13 NOTE — Telephone Encounter (Signed)
Home health PT called and states that the patients HR is running between 44 to 46. Her O2 is 98 and bp is good 138/60.  Also patient had a fall and hit her knee and hurt her finger. She was not using her walker when she fell. The nurse was there when it happen. And states patient seems to be ok. They are icing the knee. She just wanted you to be aware because she has an app tomorrow to see you.  [id number] Penni Bombard

## 2016-03-14 ENCOUNTER — Ambulatory Visit (INDEPENDENT_AMBULATORY_CARE_PROVIDER_SITE_OTHER): Payer: Medicare Other | Admitting: Internal Medicine

## 2016-03-14 ENCOUNTER — Encounter: Payer: Self-pay | Admitting: Internal Medicine

## 2016-03-14 DIAGNOSIS — I5022 Chronic systolic (congestive) heart failure: Secondary | ICD-10-CM

## 2016-03-14 DIAGNOSIS — J449 Chronic obstructive pulmonary disease, unspecified: Secondary | ICD-10-CM | POA: Diagnosis not present

## 2016-03-14 DIAGNOSIS — I482 Chronic atrial fibrillation, unspecified: Secondary | ICD-10-CM

## 2016-03-14 DIAGNOSIS — S81009D Unspecified open wound, unspecified knee, subsequent encounter: Secondary | ICD-10-CM | POA: Diagnosis not present

## 2016-03-14 DIAGNOSIS — S91009D Unspecified open wound, unspecified ankle, subsequent encounter: Secondary | ICD-10-CM

## 2016-03-14 DIAGNOSIS — R600 Localized edema: Secondary | ICD-10-CM

## 2016-03-14 DIAGNOSIS — N184 Chronic kidney disease, stage 4 (severe): Secondary | ICD-10-CM

## 2016-03-14 DIAGNOSIS — E440 Moderate protein-calorie malnutrition: Secondary | ICD-10-CM

## 2016-03-14 DIAGNOSIS — S81809D Unspecified open wound, unspecified lower leg, subsequent encounter: Secondary | ICD-10-CM

## 2016-03-14 DIAGNOSIS — I10 Essential (primary) hypertension: Secondary | ICD-10-CM

## 2016-03-14 MED ORDER — VITAMIN C 500 MG PO TABS: 500.0000 mg | ORAL_TABLET | Freq: Every day | ORAL | 3 refills | Status: DC

## 2016-03-14 NOTE — Assessment & Plan Note (Addendum)
Creat is worse. F/u w/Dr Justin Mend May need to cut back on Demadex Epo for anemia

## 2016-03-14 NOTE — Assessment & Plan Note (Addendum)
Better  O2 [place] at home

## 2016-03-14 NOTE — Assessment & Plan Note (Signed)
Torsemide, spironolactone Wound clinic - released last Thur Use compression socks Keep up w/good Nutrition

## 2016-03-14 NOTE — Progress Notes (Signed)
Subjective:  Patient ID: Mia Contreras, female    DOB: 1929-05-12  Age: 82 y.o. MRN: [id number]  CC: No chief complaint on file.   HPI Mia Contreras presents for CHF, falls, anxiety, cardiomyopathy f/u. Using compression socks. D/c'd from [facility]. Ordering her food out a lot  Outpatient Medications Prior to Visit  Medication Sig Dispense Refill  . ALPRAZolam (XANAX) 0.5 MG tablet Take 1 tablet (0.5 mg total) by mouth 2 (two) times daily as needed for anxiety or sleep. 60 tablet 0  . amiodarone (PACERONE) 200 MG tablet TAKE 1 TABLET (200 MG TOTAL) BY MOUTH 2 (TWO) TIMES DAILY. 60 tablet 2  . apixaban (ELIQUIS) 2.5 MG TABS tablet Take 1 tablet (2.5 mg total) by mouth 2 (two) times daily. 180 tablet 3  . bisoprolol (ZEBETA) 5 MG tablet Take 0.5 tablets (2.5 mg total) by mouth daily.    . Calcium Carbonate-Vitamin D (CALCIUM 600+D) 600-400 MG-UNIT per tablet Take 1 tablet by mouth daily.    . Fluticasone-Salmeterol (ADVAIR DISKUS) 250-50 MCG/DOSE AEPB Inhale 1 puff into the lungs 2 (two) times daily. 60 each 3  . Multiple Vitamins-Minerals (MULTIVITAMIN PO) Take 1 tablet by mouth daily.     . potassium chloride SA (K-DUR,KLOR-CON) 20 MEQ tablet Take 1 tablet (20 mEq total) by mouth 2 (two) times daily.    Marland Kitchen spironolactone (ALDACTONE) 25 MG tablet Take 1 tablet (25 mg total) by mouth daily. 30 tablet 6  . torsemide (DEMADEX) 20 MG tablet Take 2 tablets (40 mg total) by mouth 2 (two) times daily. 120 tablet 6   No facility-administered medications prior to visit.     ROS Review of Systems  Constitutional: Negative for activity change, appetite change, chills, fatigue and unexpected weight change.  HENT: Negative for congestion, mouth sores and sinus pressure.   Eyes: Negative for visual disturbance.  Respiratory: Negative for cough and chest tightness.   Cardiovascular: Positive for leg swelling.  Gastrointestinal: Negative for abdominal pain and nausea.  Genitourinary:  Negative for difficulty urinating, frequency and vaginal pain.  Musculoskeletal: Negative for back pain and gait problem.  Skin: Negative for pallor and rash.  Neurological: Positive for weakness. Negative for dizziness, tremors, numbness and headaches.  Hematological: Bruises/bleeds easily.  Psychiatric/Behavioral: Negative for confusion, decreased concentration, sleep disturbance and suicidal ideas.    Objective:  BP (!) 128/58   Pulse (!) 47   Wt 115 lb (52.2 kg)   SpO2 95%   BMI 20.37 kg/m   BP Readings from Last 3 Encounters:  03/14/16 (!) 128/58  02/24/16 114/60  02/07/16 (!) 98/58    Wt Readings from Last 3 Encounters:  03/14/16 115 lb (52.2 kg)  02/24/16 120 lb 8 oz (54.7 kg)  02/07/16 125 lb 9.6 oz (57 kg)    Physical Exam  Constitutional: She appears well-developed. No distress.  HENT:  Head: Normocephalic.  Right Ear: External ear normal.  Left Ear: External ear normal.  Nose: Nose normal.  Mouth/Throat: Oropharynx is clear and moist.  Eyes: Conjunctivae are normal. Pupils are equal, round, and reactive to light. Right eye exhibits no discharge. Left eye exhibits no discharge.  Neck: Normal range of motion. Neck supple. No JVD present. No tracheal deviation present. No thyromegaly present.  Cardiovascular: Normal rate and regular rhythm.   Murmur heard. Pulmonary/Chest: No stridor. No respiratory distress. She has no wheezes.  Abdominal: Soft. Bowel sounds are normal. She exhibits no distension and no mass. There is no tenderness. There is no  rebound and no guarding.  Musculoskeletal: She exhibits tenderness. She exhibits no edema.  Lymphadenopathy:    She has no cervical adenopathy.  Neurological: She displays normal reflexes. No cranial nerve deficit. She exhibits normal muscle tone. Coordination abnormal.  Skin: No rash noted. No erythema.  Psychiatric: She has a normal mood and affect. Her behavior is normal. Judgment and thought content normal.    bradycardic Bruises Using a walker  Lab Results  Component Value Date   WBC 12.5 (H) 01/31/2016   HGB 9.4 (L) 01/31/2016   HCT 32.3 (L) 01/31/2016   PLT 364 01/31/2016   GLUCOSE 80 02/24/2016   CHOL 130 11/04/2015   TRIG 77.0 11/04/2015   HDL 54.50 11/04/2015   LDLCALC 60 11/04/2015   ALT 29 01/26/2016   AST 33 01/26/2016   NA 139 02/24/2016   K 4.2 02/24/2016   CL 102 02/24/2016   CREATININE 2.58 (H) 02/24/2016   BUN 39 (H) 02/24/2016   CO2 29 02/24/2016   TSH 0.592 01/26/2016   INR 0.95 10/25/2011   HGBA1C 5.8 02/06/2014   MICROALBUR 0.6 03/23/2011    No results found.  Assessment & Plan:   There are no diagnoses linked to this encounter. I am having Ms. Komatsu maintain her Calcium Carbonate-Vitamin D, Multiple Vitamins-Minerals (MULTIVITAMIN PO), Fluticasone-Salmeterol, apixaban, ALPRAZolam, bisoprolol, spironolactone, potassium chloride SA, torsemide, and amiodarone.  No orders of the defined types were placed in this encounter.    Follow-up: No Follow-up on file.  Walker Kehr, MD

## 2016-03-14 NOTE — Assessment & Plan Note (Signed)
  Keep up w/good Nutrition

## 2016-03-14 NOTE — Assessment & Plan Note (Addendum)
On Eliquis, Zebeta O2 [place] Start Vit C for bruising

## 2016-03-14 NOTE — Progress Notes (Signed)
Pre visit review using our clinic review tool, if applicable. No additional management support is needed unless otherwise documented below in the visit note. 

## 2016-03-14 NOTE — Assessment & Plan Note (Signed)
Wound clinic - released last Thur Use compression socks Keep up w/good Nutrition

## 2016-03-14 NOTE — Assessment & Plan Note (Addendum)
On Eliquis, Zebeta, Torsemide, amiodarone On O2

## 2016-03-14 NOTE — Patient Instructions (Signed)
Low-Sodium Eating Plan °Sodium raises blood pressure and causes water to be held in the body. Getting less sodium from food will help lower your blood pressure, reduce any swelling, and protect your heart, liver, and kidneys. We get sodium by adding salt (sodium chloride) to food. Most of our sodium comes from canned, boxed, and frozen foods. Restaurant foods, fast foods, and pizza are also very high in sodium. Even if you take medicine to lower your blood pressure or to reduce fluid in your body, getting less sodium from your food is important. °WHAT IS MY PLAN? °Most people should limit their sodium intake to 2,300 mg a day. Your health care provider recommends that you limit your sodium intake to __________ a day.  °WHAT DO I NEED TO KNOW ABOUT THIS EATING PLAN? °For the low-sodium eating plan, you will follow these general guidelines: °· Choose foods with a % Daily Value for sodium of less than 5% (as listed on the food label).   °· Use salt-free seasonings or herbs instead of table salt or sea salt.   °· Check with your health care provider or pharmacist before using salt substitutes.   °· Eat fresh foods. °· Eat more vegetables and fruits. °· Limit canned vegetables. If you do use them, rinse them well to decrease the sodium.   °· Limit cheese to 1 oz (28 g) per day.    °· Eat lower-sodium products, often labeled as "lower sodium" or "no salt added." °· Avoid foods that contain monosodium glutamate (MSG). MSG is sometimes added to Chinese food and some canned foods.   °· Check food labels (Nutrition Facts labels) on foods to learn how much sodium is in one serving. °· Eat more home-cooked food and less restaurant, buffet, and fast food.  °· When eating at a restaurant, ask that your food be prepared with less salt, or no salt if possible.   °HOW DO I READ FOOD LABELS FOR SODIUM INFORMATION? °The Nutrition Facts label lists the amount of sodium in one serving of the food. If you eat more than one serving, you  must multiply the listed amount of sodium by the number of servings. °Food labels may also identify foods as: °· Sodium free--Less than 5 mg in a serving. °· Very low sodium--35 mg or less in a serving. °· Low sodium--140 mg or less in a serving. °· Light in sodium--50% less sodium in a serving. For example, if a food that usually has 300 mg of sodium is changed to become light in sodium, it will have 150 mg of sodium. °· Reduced sodium--25% less sodium in a serving. For example, if a food that usually has 400 mg of sodium is changed to reduced sodium, it will have 300 mg of sodium. °WHAT FOODS CAN I EAT? °Grains  °Low-sodium cereals, including oats, puffed wheat and rice, and shredded wheat cereals. Low-sodium crackers. Unsalted rice and pasta. Lower-sodium bread.  °Vegetables  °Frozen or fresh vegetables. Low-sodium or reduced-sodium canned vegetables. Low-sodium or reduced-sodium tomato sauce and paste. Low-sodium or reduced-sodium tomato and vegetable juices.  °Fruits  °Fresh, frozen, and canned fruit. Fruit juice.  °Meat and Other Protein Products  °Low-sodium canned tuna and salmon. Fresh or frozen meat, poultry, seafood, and fish. Lamb. Unsalted nuts. Dried beans, peas, and lentils without added salt. Unsalted canned beans. Homemade soups without salt. Eggs.  °Dairy  °Milk. Soy milk. Ricotta cheese. Low-sodium or reduced-sodium cheeses. Yogurt.  °Condiments  °Fresh and dried herbs and spices. Salt-free seasonings. Onion and garlic powders. Low-sodium varieties of mustard and ketchup. Fresh or refrigerated horseradish. Lemon   juice.  °Fats and Oils   °Reduced-sodium salad dressings. Unsalted butter.   °Other  °Unsalted popcorn and pretzels.  °The items listed above may not be a complete list of recommended foods or beverages. Contact your dietitian for more options. °WHAT FOODS ARE NOT RECOMMENDED? °Grains  °Instant hot cereals. Bread stuffing, pancake, and biscuit mixes. Croutons. Seasoned rice or pasta mixes.  Noodle soup cups. Boxed or frozen macaroni and cheese. Self-rising flour. Regular salted crackers. °Vegetables  °Regular canned vegetables. Regular canned tomato sauce and paste. Regular tomato and vegetable juices. Frozen vegetables in sauces. Salted French fries. Olives. Pickles. Relishes. Sauerkraut. Salsa. °Meat and Other Protein Products  °Salted, canned, smoked, spiced, or pickled meats, seafood, or fish. Bacon, ham, sausage, hot dogs, corned beef, chipped beef, and packaged luncheon meats. Salt pork. Jerky. Pickled herring. Anchovies, regular canned tuna, and sardines. Salted nuts. °Dairy  °Processed cheese and cheese spreads. Cheese curds. Blue cheese and cottage cheese. Buttermilk.  °Condiments  °Onion and garlic salt, seasoned salt, table salt, and sea salt. Canned and packaged gravies. Worcestershire sauce. Tartar sauce. Barbecue sauce. Teriyaki sauce. Soy sauce, including reduced sodium. Steak sauce. Fish sauce. Oyster sauce. Cocktail sauce. Horseradish that you find on the shelf. Regular ketchup and mustard. Meat flavorings and tenderizers. Bouillon cubes. Hot sauce. Tabasco sauce. Marinades. Taco seasonings. Relishes. °Fats and Oils   °Regular salad dressings. Salted butter. Margarine. Ghee. Bacon fat.  °Other  °Potato and tortilla chips. Corn chips and puffs. Salted popcorn and pretzels. Canned or dried soups. Pizza. Frozen entrees and pot pies.   °The items listed above may not be a complete list of foods and beverages to avoid. Contact your dietitian for more information. °  °This information is not intended to replace advice given to you by your health care provider. Make sure you discuss any questions you have with your health care provider. °  °Document Released: 01/27/2002 Document Revised: 08/28/2014 Document Reviewed: 06/11/2013 °Elsevier Interactive Patient Education ©2016 Elsevier Inc. ° °

## 2016-03-15 DIAGNOSIS — I482 Chronic atrial fibrillation: Secondary | ICD-10-CM | POA: Diagnosis not present

## 2016-03-15 DIAGNOSIS — I130 Hypertensive heart and chronic kidney disease with heart failure and stage 1 through stage 4 chronic kidney disease, or unspecified chronic kidney disease: Secondary | ICD-10-CM | POA: Diagnosis not present

## 2016-03-15 DIAGNOSIS — L03115 Cellulitis of right lower limb: Secondary | ICD-10-CM | POA: Diagnosis not present

## 2016-03-15 DIAGNOSIS — I429 Cardiomyopathy, unspecified: Secondary | ICD-10-CM | POA: Diagnosis not present

## 2016-03-15 DIAGNOSIS — N183 Chronic kidney disease, stage 3 (moderate): Secondary | ICD-10-CM | POA: Diagnosis not present

## 2016-03-15 DIAGNOSIS — I5020 Unspecified systolic (congestive) heart failure: Secondary | ICD-10-CM | POA: Diagnosis not present

## 2016-03-16 ENCOUNTER — Encounter (HOSPITAL_COMMUNITY): Payer: Medicare Other

## 2016-03-16 ENCOUNTER — Ambulatory Visit (HOSPITAL_COMMUNITY)
Admission: RE | Admit: 2016-03-16 | Discharge: 2016-03-16 | Disposition: A | Discharge status: Home / Self Care | Payer: Medicare Other | Source: Ambulatory Visit | Attending: Nephrology | Admitting: Nephrology

## 2016-03-16 ENCOUNTER — Other Ambulatory Visit: Payer: Self-pay

## 2016-03-16 ENCOUNTER — Telehealth: Payer: Self-pay | Admitting: Internal Medicine

## 2016-03-16 DIAGNOSIS — N184 Chronic kidney disease, stage 4 (severe): Secondary | ICD-10-CM | POA: Diagnosis not present

## 2016-03-16 DIAGNOSIS — I130 Hypertensive heart and chronic kidney disease with heart failure and stage 1 through stage 4 chronic kidney disease, or unspecified chronic kidney disease: Secondary | ICD-10-CM | POA: Diagnosis not present

## 2016-03-16 DIAGNOSIS — I429 Cardiomyopathy, unspecified: Secondary | ICD-10-CM | POA: Diagnosis not present

## 2016-03-16 DIAGNOSIS — D631 Anemia in chronic kidney disease: Secondary | ICD-10-CM | POA: Insufficient documentation

## 2016-03-16 DIAGNOSIS — N183 Chronic kidney disease, stage 3 (moderate): Secondary | ICD-10-CM | POA: Diagnosis not present

## 2016-03-16 DIAGNOSIS — Z79899 Other long term (current) drug therapy: Secondary | ICD-10-CM | POA: Diagnosis not present

## 2016-03-16 DIAGNOSIS — Z5181 Encounter for therapeutic drug level monitoring: Secondary | ICD-10-CM | POA: Insufficient documentation

## 2016-03-16 DIAGNOSIS — I5020 Unspecified systolic (congestive) heart failure: Secondary | ICD-10-CM | POA: Diagnosis not present

## 2016-03-16 DIAGNOSIS — L03115 Cellulitis of right lower limb: Secondary | ICD-10-CM | POA: Diagnosis not present

## 2016-03-16 DIAGNOSIS — I482 Chronic atrial fibrillation: Secondary | ICD-10-CM | POA: Diagnosis not present

## 2016-03-16 LAB — FERRITIN: Ferritin: 29 ng/mL (ref 11–307)

## 2016-03-16 LAB — IRON AND TIBC
Iron: 60 ug/dL (ref 28–170)
SATURATION RATIOS: 12 % (ref 10.4–31.8)
TIBC: 517 ug/dL — AB (ref 250–450)
UIBC: 457 ug/dL

## 2016-03-16 LAB — POCT HEMOGLOBIN-HEMACUE: HEMOGLOBIN: 10 g/dL — AB (ref 12.0–15.0)

## 2016-03-16 MED ORDER — EPOETIN ALFA 10000 UNIT/ML IJ SOLN
INTRAMUSCULAR | Status: AC
  Filled 2016-03-16: qty 1

## 2016-03-16 MED ORDER — EPOETIN ALFA 10000 UNIT/ML IJ SOLN
5000.0000 [IU] | INTRAMUSCULAR | Status: DC
  Administered 2016-03-16: 5000 [IU] via SUBCUTANEOUS

## 2016-03-16 NOTE — Telephone Encounter (Signed)
FYI:  Patient states she will be seeing Dr. Justin Mend in December.  Patient states she has started injections today at [facility] short stay.  The name of this injection is epoetin alfa.  Found out she has to have this weekly indefinite.  States she has to give blood fist and then get injection.  This is at request of Dr. Justin Mend.

## 2016-03-16 NOTE — Patient Outreach (Signed)
[facility]) Care Management  03/16/2016  Mia Contreras 14-Feb-1929 [id number]   RNCM called for transition of care. Member reports she is now taking EPO injections weekly. And had a shot today. Member states otherwise she is doing well. She continues to weigh daily. Her weight today is 114 pounds. Member reports she is eating better. Denies any issues or concerns. Still active with home health agency and reports nurse was pulling up in her yard now.  Member continues to prefer telephonic follow up.  Plan telephonic call next week.  Thea Silversmith, RN, MSN, [place] Coordinator Cell: [phone]

## 2016-03-16 NOTE — Discharge Instructions (Signed)
Epoetin Alfa injection What is this medicine? EPOETIN ALFA (e POE e tin AL fa) helps your body make more red blood cells. This medicine is used to treat anemia caused by chronic kidney failure, cancer chemotherapy, or HIV-therapy. It may also be used before surgery if you have anemia. This medicine may be used for other purposes; ask your health care provider or pharmacist if you have questions. What should I tell my health care provider before I take this medicine? They need to know if you have any of these conditions: -blood clotting disorders -cancer patient not on chemotherapy -cystic fibrosis -heart disease, such as angina or heart failure -hemoglobin level of 12 g/dL or greater -high blood pressure -low levels of folate, iron, or vitamin B12 -seizures -an unusual or allergic reaction to erythropoietin, albumin, benzyl alcohol, hamster proteins, other medicines, foods, dyes, or preservatives -pregnant or trying to get pregnant -breast-feeding How should I use this medicine? This medicine is for injection into a vein or under the skin. It is usually given by a health care professional in a hospital or clinic setting. If you get this medicine at home, you will be taught how to prepare and give this medicine. Use exactly as directed. Take your medicine at regular intervals. Do not take your medicine more often than directed. It is important that you put your used needles and syringes in a special sharps container. Do not put them in a trash can. If you do not have a sharps container, call your pharmacist or healthcare provider to get one. Talk to your pediatrician regarding the use of this medicine in children. While this drug may be prescribed for selected conditions, precautions do apply. Overdosage: If you think you have taken too much of this medicine contact a poison control center or emergency room at once. NOTE: This medicine is only for you. Do not share this medicine with  others. What if I miss a dose? If you miss a dose, take it as soon as you can. If it is almost time for your next dose, take only that dose. Do not take double or extra doses. What may interact with this medicine? Do not take this medicine with any of the following medications: -darbepoetin alfa This list may not describe all possible interactions. Give your health care provider a list of all the medicines, herbs, non-prescription drugs, or dietary supplements you use. Also tell them if you smoke, drink alcohol, or use illegal drugs. Some items may interact with your medicine. What should I watch for while using this medicine? Visit your prescriber or health care professional for regular checks on your progress and for the needed blood tests and blood pressure measurements. It is especially important for the doctor to make sure your hemoglobin level is in the desired range, to limit the risk of potential side effects and to give you the best benefit. Keep all appointments for any recommended tests. Check your blood pressure as directed. Ask your doctor what your blood pressure should be and when you should contact him or her. As your body makes more red blood cells, you may need to take iron, folic acid, or vitamin B supplements. Ask your doctor or health care provider which products are right for you. If you have kidney disease continue dietary restrictions, even though this medication can make you feel better. Talk with your doctor or health care professional about the foods you eat and the vitamins that you take. What side effects may I notice   from receiving this medicine? Side effects that you should report to your doctor or health care professional as soon as possible: -allergic reactions like skin rash, itching or hives, swelling of the face, lips, or tongue -breathing problems -changes in vision -chest pain -confusion, trouble speaking or understanding -feeling faint or lightheaded,  falls -high blood pressure -muscle aches or pains -pain, swelling, warmth in the leg -rapid weight gain -severe headaches -sudden numbness or weakness of the face, arm or leg -trouble walking, dizziness, loss of balance or coordination -seizures (convulsions) -swelling of the ankles, feet, hands -unusually weak or tired Side effects that usually do not require medical attention (report to your doctor or health care professional if they continue or are bothersome): -diarrhea -fever, chills (flu-like symptoms) -headaches -nausea, vomiting -redness, stinging, or swelling at site where injected This list may not describe all possible side effects. Call your doctor for medical advice about side effects. You may report side effects to FDA at 1-800-FDA-1088. Where should I keep my medicine? Keep out of the reach of children. Store in a refrigerator between 2 and 8 degrees C (36 and 46 degrees F). Do not freeze or shake. Throw away any unused portion if using a single-dose vial. Multi-dose vials can be kept in the refrigerator for up to 21 days after the initial dose. Throw away unused medicine. NOTE: This sheet is a summary. It may not cover all possible information. If you have questions about this medicine, talk to your doctor, pharmacist, or health care provider.    2016, Elsevier/Gold Standard. (2008-07-21 10:25:44)  

## 2016-03-18 DIAGNOSIS — N183 Chronic kidney disease, stage 3 (moderate): Secondary | ICD-10-CM | POA: Diagnosis not present

## 2016-03-18 DIAGNOSIS — L03115 Cellulitis of right lower limb: Secondary | ICD-10-CM | POA: Diagnosis not present

## 2016-03-18 DIAGNOSIS — I5020 Unspecified systolic (congestive) heart failure: Secondary | ICD-10-CM | POA: Diagnosis not present

## 2016-03-18 DIAGNOSIS — I482 Chronic atrial fibrillation: Secondary | ICD-10-CM | POA: Diagnosis not present

## 2016-03-18 DIAGNOSIS — I429 Cardiomyopathy, unspecified: Secondary | ICD-10-CM | POA: Diagnosis not present

## 2016-03-18 DIAGNOSIS — I130 Hypertensive heart and chronic kidney disease with heart failure and stage 1 through stage 4 chronic kidney disease, or unspecified chronic kidney disease: Secondary | ICD-10-CM | POA: Diagnosis not present

## 2016-03-19 ENCOUNTER — Other Ambulatory Visit: Payer: Self-pay | Admitting: Internal Medicine

## 2016-03-20 ENCOUNTER — Other Ambulatory Visit (HOSPITAL_COMMUNITY): Payer: Self-pay | Admitting: *Deleted

## 2016-03-20 ENCOUNTER — Other Ambulatory Visit: Payer: Self-pay | Admitting: *Deleted

## 2016-03-20 MED ORDER — SPIRONOLACTONE 25 MG PO TABS: 25.0000 mg | ORAL_TABLET | Freq: Every day | ORAL | 5 refills | Status: DC

## 2016-03-20 MED ORDER — BISOPROLOL FUMARATE 5 MG PO TABS: 2.5000 mg | ORAL_TABLET | Freq: Every day | ORAL | 5 refills | Status: DC

## 2016-03-20 NOTE — Telephone Encounter (Signed)
Rec'd call pt states she fail to ask for refills on her Bisoprolol & Spironolactone when she saw MD last week. Verified pharmacy inform [facility].../lmb

## 2016-03-21 NOTE — Telephone Encounter (Signed)
Noted. Thx.

## 2016-03-22 ENCOUNTER — Telehealth: Payer: Self-pay

## 2016-03-22 NOTE — Telephone Encounter (Signed)
Home Health Cert/Plan of Care received (12/23/2015 - 02/20/2016) signed. Faxed,  Copy sent to scan.

## 2016-03-24 ENCOUNTER — Other Ambulatory Visit: Payer: Self-pay | Admitting: *Deleted

## 2016-03-24 ENCOUNTER — Ambulatory Visit: Payer: Self-pay

## 2016-03-24 ENCOUNTER — Encounter (HOSPITAL_COMMUNITY)
Admission: RE | Admit: 2016-03-24 | Discharge: 2016-03-24 | Disposition: A | Discharge status: Home / Self Care | Payer: Medicare Other | Source: Ambulatory Visit | Attending: Nephrology | Admitting: Nephrology

## 2016-03-24 DIAGNOSIS — Z79899 Other long term (current) drug therapy: Secondary | ICD-10-CM | POA: Diagnosis not present

## 2016-03-24 DIAGNOSIS — N184 Chronic kidney disease, stage 4 (severe): Secondary | ICD-10-CM

## 2016-03-24 DIAGNOSIS — Z5181 Encounter for therapeutic drug level monitoring: Secondary | ICD-10-CM | POA: Diagnosis not present

## 2016-03-24 DIAGNOSIS — D631 Anemia in chronic kidney disease: Secondary | ICD-10-CM | POA: Diagnosis not present

## 2016-03-24 LAB — POCT HEMOGLOBIN-HEMACUE: Hemoglobin: 10 g/dL — ABNORMAL LOW (ref 12.0–15.0)

## 2016-03-24 MED ORDER — EPOETIN ALFA 10000 UNIT/ML IJ SOLN
INTRAMUSCULAR | Status: AC
  Administered 2016-03-24: 5000 [IU] via SUBCUTANEOUS
  Filled 2016-03-24: qty 1

## 2016-03-24 MED ORDER — EPOETIN ALFA 10000 UNIT/ML IJ SOLN
5000.0000 [IU] | INTRAMUSCULAR | Status: DC
  Administered 2016-03-24: 5000 [IU] via SUBCUTANEOUS

## 2016-03-24 NOTE — Patient Outreach (Addendum)
[facility]) Care Management  03/24/2016  Danesa Nasir Hulsey 03/01/1929 [id number]   Covering for Erskin Burnet:  Transition of care call placed to member, no answer, unable to leave a message.  Will provide update to assigned care manager upon return.  Valente David, [place], MSN [place] [phone]    Update:  Call received back from member.  She state that she is doing fine, no complaints or concerns.  She reports taking all medications without problems, denies any change in her weight.  She state that she was supposed to have a visit from the home health nurse yesterday but they didn't show.  She state that she is to be complete with home health this week.    Will provide assigned care manager with update upon return.

## 2016-03-30 ENCOUNTER — Other Ambulatory Visit: Payer: Self-pay

## 2016-03-30 ENCOUNTER — Other Ambulatory Visit (HOSPITAL_COMMUNITY): Payer: Self-pay | Admitting: *Deleted

## 2016-03-30 DIAGNOSIS — N183 Chronic kidney disease, stage 3 (moderate): Secondary | ICD-10-CM | POA: Diagnosis not present

## 2016-03-30 DIAGNOSIS — I5020 Unspecified systolic (congestive) heart failure: Secondary | ICD-10-CM | POA: Diagnosis not present

## 2016-03-30 DIAGNOSIS — I482 Chronic atrial fibrillation: Secondary | ICD-10-CM | POA: Diagnosis not present

## 2016-03-30 DIAGNOSIS — L03115 Cellulitis of right lower limb: Secondary | ICD-10-CM | POA: Diagnosis not present

## 2016-03-30 DIAGNOSIS — I130 Hypertensive heart and chronic kidney disease with heart failure and stage 1 through stage 4 chronic kidney disease, or unspecified chronic kidney disease: Secondary | ICD-10-CM | POA: Diagnosis not present

## 2016-03-30 DIAGNOSIS — I429 Cardiomyopathy, unspecified: Secondary | ICD-10-CM | POA: Diagnosis not present

## 2016-03-30 NOTE — Patient Outreach (Signed)
[facility]) Care Management  03/30/2016  Mia Contreras 26-Apr-1929 [id number]  Transition of care call. Member reports she continues to manage heart failure. Reports her weight today is 117 pounds. She continues to follow up with providers as scheduled. Member continues to get weekly epoetin injections. Member reports speaking with nephrologist office and being informed iron was low. RNCM reviewed foods high in iron.   Plan: follow up next week for transition of care call.  Thea Silversmith, RN, MSN, [place] Coordinator Cell: [phone]

## 2016-03-31 ENCOUNTER — Encounter (HOSPITAL_COMMUNITY)
Admission: RE | Admit: 2016-03-31 | Discharge: 2016-03-31 | Disposition: A | Discharge status: Home / Self Care | Payer: Medicare Other | Source: Ambulatory Visit | Attending: Nephrology | Admitting: Nephrology

## 2016-03-31 DIAGNOSIS — D509 Iron deficiency anemia, unspecified: Secondary | ICD-10-CM | POA: Diagnosis not present

## 2016-03-31 DIAGNOSIS — N184 Chronic kidney disease, stage 4 (severe): Secondary | ICD-10-CM | POA: Diagnosis present

## 2016-03-31 LAB — POCT HEMOGLOBIN-HEMACUE: HEMOGLOBIN: 9.6 g/dL — AB (ref 12.0–15.0)

## 2016-03-31 MED ORDER — EPOETIN ALFA 10000 UNIT/ML IJ SOLN
INTRAMUSCULAR | Status: AC
Start: 2016-03-31 — End: 2016-03-31
  Administered 2016-03-31: 5000 [IU] via SUBCUTANEOUS
  Filled 2016-03-31: qty 1

## 2016-03-31 MED ORDER — EPOETIN ALFA 10000 UNIT/ML IJ SOLN
5000.0000 [IU] | INTRAMUSCULAR | Status: DC
  Administered 2016-03-31: 5000 [IU] via SUBCUTANEOUS

## 2016-03-31 MED ORDER — SODIUM CHLORIDE 0.9 % IV SOLN
510.0000 mg | INTRAVENOUS | Status: DC
  Administered 2016-03-31: 510 mg via INTRAVENOUS
  Filled 2016-03-31: qty 17

## 2016-04-04 ENCOUNTER — Other Ambulatory Visit: Payer: Self-pay | Admitting: *Deleted

## 2016-04-04 DIAGNOSIS — I5043 Acute on chronic combined systolic (congestive) and diastolic (congestive) heart failure: Secondary | ICD-10-CM

## 2016-04-04 DIAGNOSIS — N182 Chronic kidney disease, stage 2 (mild): Secondary | ICD-10-CM

## 2016-04-04 DIAGNOSIS — I872 Venous insufficiency (chronic) (peripheral): Secondary | ICD-10-CM

## 2016-04-04 DIAGNOSIS — I130 Hypertensive heart and chronic kidney disease with heart failure and stage 1 through stage 4 chronic kidney disease, or unspecified chronic kidney disease: Secondary | ICD-10-CM

## 2016-04-04 MED ORDER — POTASSIUM CHLORIDE CRYS ER 20 MEQ PO TBCR: 20.0000 meq | EXTENDED_RELEASE_TABLET | Freq: Two times a day (BID) | ORAL | 5 refills | Status: DC

## 2016-04-04 NOTE — Telephone Encounter (Signed)
Rec'd call pt requesting refill on her potassium [facility]. Sent electronically...[name]

## 2016-04-06 ENCOUNTER — Other Ambulatory Visit: Payer: Self-pay

## 2016-04-06 ENCOUNTER — Encounter: Payer: Self-pay | Admitting: Cardiovascular Disease

## 2016-04-06 ENCOUNTER — Ambulatory Visit: Payer: Medicare Other

## 2016-04-06 NOTE — Patient Outreach (Signed)
[facility]) Care Management  04/06/2016  Mia Contreras 1928/10/21 [id number]  82 year old with recent discharge from the hospital due to heart failure followed by a rehabilitation stay. RNCM has been following telephonically (per member request) for transition of care.  RNCM called both home and cell number. No answer. Unable to leave message.  Plan: continue to try to reach member.Thea Silversmith, RN, MSN, [place] Coordinator Cell: [phone]

## 2016-04-07 ENCOUNTER — Encounter (HOSPITAL_COMMUNITY)
Admission: RE | Admit: 2016-04-07 | Discharge: 2016-04-07 | Disposition: A | Discharge status: Home / Self Care | Payer: Medicare Other | Source: Ambulatory Visit | Attending: Nephrology | Admitting: Nephrology

## 2016-04-07 DIAGNOSIS — Z79899 Other long term (current) drug therapy: Secondary | ICD-10-CM | POA: Insufficient documentation

## 2016-04-07 DIAGNOSIS — Z5181 Encounter for therapeutic drug level monitoring: Secondary | ICD-10-CM | POA: Diagnosis not present

## 2016-04-07 DIAGNOSIS — D631 Anemia in chronic kidney disease: Secondary | ICD-10-CM | POA: Diagnosis not present

## 2016-04-07 DIAGNOSIS — D509 Iron deficiency anemia, unspecified: Secondary | ICD-10-CM | POA: Diagnosis not present

## 2016-04-07 DIAGNOSIS — N184 Chronic kidney disease, stage 4 (severe): Secondary | ICD-10-CM | POA: Diagnosis not present

## 2016-04-07 LAB — POCT HEMOGLOBIN-HEMACUE: HEMOGLOBIN: 10.9 g/dL — AB (ref 12.0–15.0)

## 2016-04-07 MED ORDER — EPOETIN ALFA 10000 UNIT/ML IJ SOLN
INTRAMUSCULAR | Status: AC
  Administered 2016-04-07: 5000 [IU] via SUBCUTANEOUS
  Filled 2016-04-07: qty 1

## 2016-04-07 MED ORDER — SODIUM CHLORIDE 0.9 % IV SOLN
510.0000 mg | INTRAVENOUS | Status: AC
  Administered 2016-04-07: 510 mg via INTRAVENOUS
  Filled 2016-04-07: qty 17

## 2016-04-07 MED ORDER — EPOETIN ALFA 10000 UNIT/ML IJ SOLN
5000.0000 [IU] | INTRAMUSCULAR | Status: DC
  Administered 2016-04-07: 5000 [IU] via SUBCUTANEOUS

## 2016-04-07 NOTE — Progress Notes (Signed)
Patient ID: Mia Contreras, female   DOB: 1929-06-30, 82 y.o.   MRN: [id number]    Advanced Heart Failure Clinic Note   Referring Physician: Dr Johnsie Cancel Primary Care: Arlana Lindau, MD Primary Cardiologist: Dr Johnsie Cancel Primary HF: Dr. Haroldine Laws  HPI: Mia Contreras is a 82 y.o. female with a history of nonischemic cardiomyopathy, systolic CHF, PACs, L Breast cancer in 2000 with surgery and chemo.  She has never had a cath. Had 4 rounds of chemo for breast CA. Doesn't remember which agents she got. Does not recall a red medicine.   Admitted 4/25 -12/19/15 with A/C combined CHF and AKI. Creatinine improved.  She diuresed 12 L and down 19 lbs from highest weight that admission. Discharge weight 134 lbs.   Admitted 6/7 -> 02/07/16 with volume overload. Diuresed 10 lbs on IV lasix and transitioned to torsemide for home. Completed course of levaquin for left basilar infiltrates on CXR.  Was discharged to [facility] due to frequent falls at home. Discharge weight 125 lbs.   Doing much better Legs no edema.  Wearing support hose. Complains that eliquis causing easy  Bruising   Seeing day hospital for FeSO4 injections and erythropoietin   11/2015 ECHO EF 45-50%    Recent labs: 12/01/15: Sodium 140 K 5.0 creatinine 2.5 (was 1.3 in 3/17). Albumin 3.8 BNP 1090 12/19/15: K 4.0, Creatinine 1.67 12/28/15: K 5.4, Creatinine 2.26 12/31/15 K 4.8, creatinine 2.49 01/07/2016 K 4.9 Creatinine 2.93  02/07/16 K 3.5, Creatinine 1.26  FH: Brother also has CAD and afib, Mother had CABG but did well, Father passed away from MI at age 27 thought to be 2/2 to heavy ETOH use.  SH:  Pt has a distant history of smoking. Pt has 50-60 pack years up to 2ppd. Quit in 1982.   Past Medical History:  Diagnosis Date  . AKI (acute kidney injury) ([place]) 01/2016  . ANEMIA-NOS 08/20/2007  . ANXIETY 03/16/2007  . BREAST CANCER, HX OF 03/16/2007  . CARPAL TUNNEL SYNDROME, RIGHT 01/11/2008  . Cataract    floaters  . CELLULITIS,  LEG, RIGHT 08/20/2007  . Collagenous colitis   . COPD (chronic obstructive pulmonary disease) ([place])   . DIVERTICULOSIS, COLON 08/20/2007  . GOITER, MULTINODULAR 05/06/2010   Dr [name]  . HYPERLIPIDEMIA 08/20/2007  . HYPERTENSION 03/16/2007  . HYPERTHYROIDISM 05/23/2010  . IBS (irritable bowel syndrome)   . OSTEOARTHRITIS 03/16/2007  . OSTEOPOROSIS 08/20/2007  . Pneumonia 07/2011   took abx for several weeks  . PREMATURE ATRIAL CONTRACTIONS 09/21/2010  . SCOLIOSIS 01/11/2008  . SKIN CANCER, HX OF 11/12/2008    R cheek 2010, L Cheek 2011 Dr. Tonia Brooms  . Thyroid nodule   . VENOUS INSUFFICIENCY 09/05/2007    Current Outpatient Prescriptions  Medication Sig Dispense Refill  . ALPRAZolam (XANAX) 0.5 MG tablet Take 1 tablet (0.5 mg total) by mouth 2 (two) times daily as needed for anxiety or sleep. 60 tablet 0  . amiodarone (PACERONE) 200 MG tablet TAKE 1 TABLET (200 MG TOTAL) BY MOUTH 2 (TWO) TIMES DAILY. 60 tablet 2  . apixaban (ELIQUIS) 2.5 MG TABS tablet Take 1 tablet (2.5 mg total) by mouth 2 (two) times daily. 180 tablet 3  . bisoprolol (ZEBETA) 5 MG tablet Take 0.5 tablets (2.5 mg total) by mouth daily. 30 tablet 5  . Calcium Carbonate-Vitamin D (CALCIUM 600+D) 600-400 MG-UNIT per tablet Take 1 tablet by mouth daily.    . Fluticasone-Salmeterol (ADVAIR DISKUS) 250-50 MCG/DOSE AEPB Inhale 1 puff into the lungs 2 (  two) times daily. 60 each 3  . Multiple Vitamins-Minerals (MULTIVITAMIN PO) Take 1 tablet by mouth daily.     . potassium chloride SA (K-DUR,KLOR-CON) 20 MEQ tablet Take 1 tablet (20 mEq total) by mouth 2 (two) times daily. 60 tablet 5  . spironolactone (ALDACTONE) 25 MG tablet Take 1 tablet (25 mg total) by mouth daily. 30 tablet 5  . torsemide (DEMADEX) 20 MG tablet Take 2 tablets (40 mg total) by mouth 2 (two) times daily. 120 tablet 6  . vitamin C (ASCORBIC ACID) 500 MG tablet Take 1 tablet (500 mg total) by mouth daily. 100 tablet 3   No current facility-administered  medications for this visit.    Facility-Administered Medications Ordered in Other Visits  Medication Dose Route Frequency Provider Last Rate Last Dose  . epoetin alfa (EPOGEN,PROCRIT) injection 5,000 Units  5,000 Units Subcutaneous Weekly Edrick Oh, MD   5,000 Units at 04/07/16 1300    Allergies  Allergen Reactions  . Cefuroxime Diarrhea  . Celecoxib Nausea Only  . Deltasone [Prednisone]     Oral deltasone is causing swelling (can use Depo-Medrol)  . Ranitidine     bloating  . Tramadol Hcl Nausea Only      Social History   Social History  . Marital status: Single    Spouse name: N/A  . Number of children: N/A  . Years of education: N/A   Occupational History  . Not on file.   Social History Main Topics  . Smoking status: Former Smoker    Quit date: 12/20/1980  . Smokeless tobacco: Never Used  . Alcohol use Yes     Comment: socially  . Drug use: No  . Sexual activity: Not Currently   Other Topics Concern  . Not on file   Social History Narrative  . No narrative on file      Family History  Problem Relation Age of Onset  . Dementia Mother   . Heart disease Mother   . Mental retardation Mother   . Colon cancer Neg Hx   . Hypertension Mother   . Alzheimer's disease Mother   . Prostate cancer Brother   . Diabetes Other   . Heart attack Father   . Alcohol abuse Father   . Anesthesia problems Neg Hx     There were no vitals filed for this visit. Wt Readings from Last 3 Encounters:  03/31/16 115 lb 4 oz (52.3 kg)  03/14/16 115 lb (52.2 kg)  02/24/16 120 lb 8 oz (54.7 kg)    PHYSICAL EXAM: General:  Elderly. Sitting in [place]. NAD. Sitter present. Marland Kitchen  HEENT: normal Neck: supple. JVP 6-7  Carotids 2+ bilat; no bruits. No thyromegaly or nodule noted.  Cor: PMI nondisplaced. Irregular rate & rhythm. No M/G/R Lungs: Clear, normal effort Abdomen: soft, NT, ND, no HSM. No bruits or masses. +BS  Extremities: no edema wounds healed  Neuro: alert & oriented x 3,  cranial nerves grossly intact. moves all 4 extremities w/o difficulty. Affect pleasant.  ASSESSMENT & PLAN: 1. Chronic systolic HF with R>>L symptoms. ECHO 11/2015 EF 45-50%.  NYHA III. 2. AKI on CKD stage III 3. LE wounds- resolved   4.  H/o COPD 5. H/o breast cancer s/p chemo 2000 6. Chronic A fib- Rate controlled. On Eliquis twice a day.  Will try xarelto 15 mg at patient request But suspect bruising will be same  -This patients CHA2DS2-VASc Score is 6. 7. Falls- Remains in SNF - [name] 8. Anemia check  labs continue f/u Dr Justin Mend getting erythropoietin   Samples of xarelto given  Hct/Hb and BMET drawn suspect she will need to have demedex And K cut back a bit.    Jenkins Rouge

## 2016-04-11 ENCOUNTER — Encounter: Payer: Self-pay | Admitting: Cardiovascular Disease

## 2016-04-11 ENCOUNTER — Ambulatory Visit (INDEPENDENT_AMBULATORY_CARE_PROVIDER_SITE_OTHER): Payer: Medicare Other | Admitting: Cardiovascular Disease

## 2016-04-11 ENCOUNTER — Other Ambulatory Visit: Payer: Self-pay

## 2016-04-11 VITALS — BP 108/80 | HR 46 | Ht 63.0 in | Wt 116.8 lb

## 2016-04-11 DIAGNOSIS — Z79899 Other long term (current) drug therapy: Secondary | ICD-10-CM

## 2016-04-11 LAB — HEMATOCRIT: HCT: 35.1 % (ref 35.0–45.0)

## 2016-04-11 LAB — HEMOGLOBIN: Hemoglobin: 11.2 g/dL — ABNORMAL LOW (ref 11.7–15.5)

## 2016-04-11 MED ORDER — RIVAROXABAN 15 MG PO TABS: 15.0000 mg | ORAL_TABLET | Freq: Every day | ORAL | 3 refills | Status: DC

## 2016-04-11 NOTE — Patient Outreach (Signed)
[facility]) Care Management  04/11/2016  Jakiya Swoveland Balch 11-27-1928 [id number]  RNCM called for transition of care. Member reports she is doing well. Member continues to weigh self daily and reports her weight today is 114 pounds.  Upcoming appointment discussed. Member reports she is getting ready for an appointment with cardiologist today and is waiting for her ride. Member also has an appointment with advanced heart failure clinic on tomorrow and an appointment with her primary care in September. RNCM discussed actions she is taking to help manage heart failure, member reports staying as active as possible helps in management of health. Member reports she bruises easily-RNCM encouraged member to discuss with her doctors. Member reports she continues to have injections for low iron levels.  Plan: continue weekly transition of care calls.  Thea Silversmith, RN, MSN, [place] Coordinator Cell: [phone]

## 2016-04-11 NOTE — Patient Instructions (Addendum)
Medication Instructions:  Your physician has recommended you make the following change in your medication:  1-STOP eliquis 2-START xarelto 15 mg by mouth daily Labwork: Your physician recommends that you have lab work today- Hemoglobin and Hematocrit  Testing/Procedures: NONE  Follow-Up: Your physician wants you to follow-up in: 3 months with Dr. Johnsie Cancel.    If you need a refill on your cardiac medications before your next appointment, please call your pharmacy.

## 2016-04-12 ENCOUNTER — Inpatient Hospital Stay (HOSPITAL_COMMUNITY): Admission: RE | Admit: 2016-04-12 | Payer: Medicare Other | Source: Ambulatory Visit | Admitting: Internal Medicine

## 2016-04-13 ENCOUNTER — Ambulatory Visit (INDEPENDENT_AMBULATORY_CARE_PROVIDER_SITE_OTHER): Payer: Medicare Other | Admitting: Family

## 2016-04-13 ENCOUNTER — Encounter: Payer: Self-pay | Admitting: Family

## 2016-04-13 VITALS — BP 104/50 | HR 44 | Temp 97.6°F | Resp 18 | Ht 63.0 in | Wt 118.0 lb

## 2016-04-13 DIAGNOSIS — S81801D Unspecified open wound, right lower leg, subsequent encounter: Secondary | ICD-10-CM | POA: Diagnosis not present

## 2016-04-13 DIAGNOSIS — S51812A Laceration without foreign body of left forearm, initial encounter: Secondary | ICD-10-CM

## 2016-04-13 DIAGNOSIS — S51802A Unspecified open wound of left forearm, initial encounter: Secondary | ICD-10-CM

## 2016-04-13 DIAGNOSIS — S81811D Laceration without foreign body, right lower leg, subsequent encounter: Secondary | ICD-10-CM

## 2016-04-13 NOTE — Assessment & Plan Note (Addendum)
New skin tear to the left forearm with continued oozing appears stable. Cleansed with soap and water and dressed with a compression wrap. Instructed to ice and elevate. Concern for wound healing complicated by nutritional intake and Xarelto. Encouraged to increase protein intake and change dressing as needed. Will require close monitoring as she does have another wound that is slow to heal. Tetanus is up to date.

## 2016-04-13 NOTE — Assessment & Plan Note (Signed)
Wound appears to be slowly healing with no evidence of infection. Limit antibiotic cream. Continue basic wound care with soap and water. Continue to monitor.

## 2016-04-13 NOTE — Patient Instructions (Signed)
Thank you for choosing [facility].  SUMMARY AND INSTRUCTIONS:  Please continue basic wound care including soap and water.   Compression and elevation of your left arm. Ice x 20 minutes.  Non-adeherent gauze with dressing change as needed.  Use polysporin minimally.  Follow up:  If your symptoms worsen or fail to improve, please contact our office for further instruction, or in case of emergency go directly to the emergency room at the closest medical facility.    Skin Tear Care A skin tear is a wound in which the top layer of skin has peeled off. This is a common problem with aging because the skin becomes thinner and more fragile as a person gets older. In addition, some medicines, such as oral corticosteroids, can lead to skin thinning if taken for long periods of time.  A skin tear is often repaired with tape or skin adhesive strips. This keeps the skin that has been peeled off in contact with the healthier skin beneath. Depending on the location of the wound, a bandage (dressing) may be applied over the tape or skin adhesive strips. Sometimes, during the healing process, the skin turns black and dies. Even when this happens, the torn skin acts as a good dressing until the skin underneath gets healthier and repairs itself. HOME CARE INSTRUCTIONS   Change dressings once per day or as directed by your caregiver.  Gently clean the skin tear and the area around the tear using saline solution or mild soap and water.  Do not rub the injured skin dry. Let the area air dry.  Apply petroleum jelly or an antibiotic cream or ointment to keep the tear moist. This will help the wound heal. Do not allow a scab to form.  If the dressing sticks before the next dressing change, moisten it with warm soapy water and gently remove it.  Protect the injured skin until it has healed.  Only take over-the-counter or prescription medicines as directed by your caregiver.  Take showers or baths  using warm soapy water. Apply a new dressing after the shower or bath.  Keep all follow-up appointments as directed by your caregiver.  SEEK IMMEDIATE MEDICAL CARE IF:   You have redness, swelling, or increasing pain in the skin tear.  You havepus coming from the skin tear.  You have chills.  You have a red streak that goes away from the skin tear.  You have a bad smell coming from the tear or dressing.  You have a fever or persistent symptoms for more than 2-3 days.  You have a fever and your symptoms suddenly get worse. MAKE SURE YOU:  Understand these instructions.  Will watch this condition.  Will get help right away if your child is not doing well or gets worse.   This information is not intended to replace advice given to you by your health care provider. Make sure you discuss any questions you have with your health care provider.   Document Released: 05/02/2001 Document Revised: 05/01/2012 Document Reviewed: 02/19/2012 Elsevier Interactive Patient Education [employer].

## 2016-04-13 NOTE — Progress Notes (Addendum)
Subjective:    Patient ID: Mia Contreras, female    DOB: Jun 18, 1929, 82 y.o.   MRN: [id number]  Chief Complaint  Patient presents with  . Open Wound    HPI:  Mia Contreras is a 82 y.o. female who  has a past medical history of AKI (acute kidney injury) ([place]) (01/2016); ANEMIA-NOS (08/20/2007); ANXIETY (03/16/2007); BREAST CANCER, HX OF (03/16/2007); CARPAL TUNNEL SYNDROME, RIGHT (01/11/2008); Cataract; CELLULITIS, LEG, RIGHT (08/20/2007); Collagenous colitis; COPD (chronic obstructive pulmonary disease) ([place]); DIVERTICULOSIS, COLON (08/20/2007); GOITER, MULTINODULAR (05/06/2010); HYPERLIPIDEMIA (08/20/2007); HYPERTENSION (03/16/2007); HYPERTHYROIDISM (05/23/2010); IBS (irritable bowel syndrome); OSTEOARTHRITIS (03/16/2007); OSTEOPOROSIS (08/20/2007); Pneumonia (07/2011); PREMATURE ATRIAL CONTRACTIONS (09/21/2010); SCOLIOSIS (01/11/2008); SKIN CANCER, HX OF (11/12/2008); Thyroid nodule; and VENOUS INSUFFICIENCY (09/05/2007). and presents today for an acute office visit.  This is a new problem. Associated symptom of a wound located on her left arm has been going on since this morning since she tripped and fell into her counter. Modifying factors include cleaning it with soap and water and placing polysporin on it. She currently takes Xarelto. Also has a previous wound located on her right lower extremity which she indicates is slow to heal.   Allergies  Allergen Reactions  . Cefuroxime Diarrhea  . Celecoxib Nausea Only  . Deltasone [Prednisone]     Oral deltasone is causing swelling (can use Depo-Medrol)  . Ranitidine     bloating  . Tramadol Hcl Nausea Only      Outpatient Medications Prior to Visit  Medication Sig Dispense Refill  . ALPRAZolam (XANAX) 0.5 MG tablet Take 1 tablet (0.5 mg total) by mouth 2 (two) times daily as needed for anxiety or sleep. 60 tablet 0  . amiodarone (PACERONE) 200 MG tablet TAKE 1 TABLET (200 MG TOTAL) BY MOUTH 2 (TWO) TIMES DAILY. 60 tablet 2  . bisoprolol  (ZEBETA) 5 MG tablet Take 0.5 tablets (2.5 mg total) by mouth daily. 30 tablet 5  . Calcium Carbonate-Vitamin D (CALCIUM 600+D) 600-400 MG-UNIT per tablet Take 1 tablet by mouth daily.    . Fluticasone-Salmeterol (ADVAIR DISKUS) 250-50 MCG/DOSE AEPB Inhale 1 puff into the lungs 2 (two) times daily. 60 each 3  . Multiple Vitamins-Minerals (MULTIVITAMIN PO) Take 1 tablet by mouth daily.     . potassium chloride SA (K-DUR,KLOR-CON) 20 MEQ tablet Take 1 tablet (20 mEq total) by mouth 2 (two) times daily. 60 tablet 5  . Rivaroxaban (XARELTO) 15 MG TABS tablet Take 1 tablet (15 mg total) by mouth daily with supper. 90 tablet 3  . spironolactone (ALDACTONE) 25 MG tablet Take 1 tablet (25 mg total) by mouth daily. 30 tablet 5  . torsemide (DEMADEX) 20 MG tablet Take 2 tablets (40 mg total) by mouth 2 (two) times daily. 120 tablet 6  . vitamin C (ASCORBIC ACID) 500 MG tablet Take 1 tablet (500 mg total) by mouth daily. 100 tablet 3   No facility-administered medications prior to visit.       Past Surgical History:  Procedure Laterality Date  . ABDOMINAL HYSTERECTOMY    . APPENDECTOMY  1972  . BREAST LUMPECTOMY Left 2001  . CARPAL TUNNEL RELEASE    . CATARACT EXTRACTION    . EYE SURGERY     cataract ext  . HERNIA REPAIR    . MASTECTOMY, PARTIAL Left   . SKIN CANCER EXCISION     face-Dr. Bing Plume  . TOTAL KNEE ARTHROPLASTY    . TOTAL KNEE ARTHROPLASTY Right 2010   Dr Para March  . TOTAL KNEE  ARTHROPLASTY  10/30/2011   Procedure: TOTAL KNEE ARTHROPLASTY;  Surgeon: Lorn Junes, MD;  Location: [facility];  Service: Orthopedics;  Laterality: Left;     . XRT     chemo, surgery      Past Medical History:  Diagnosis Date  . AKI (acute kidney injury) ([place]) 01/2016  . ANEMIA-NOS 08/20/2007  . ANXIETY 03/16/2007  . BREAST CANCER, HX OF 03/16/2007  . CARPAL TUNNEL SYNDROME, RIGHT 01/11/2008  . Cataract    floaters  . CELLULITIS, LEG, RIGHT 08/20/2007  . Collagenous colitis   . COPD (chronic  obstructive pulmonary disease) ([place])   . DIVERTICULOSIS, COLON 08/20/2007  . GOITER, MULTINODULAR 05/06/2010   Dr [name]  . HYPERLIPIDEMIA 08/20/2007  . HYPERTENSION 03/16/2007  . HYPERTHYROIDISM 05/23/2010  . IBS (irritable bowel syndrome)   . OSTEOARTHRITIS 03/16/2007  . OSTEOPOROSIS 08/20/2007  . Pneumonia 07/2011   took abx for several weeks  . PREMATURE ATRIAL CONTRACTIONS 09/21/2010  . SCOLIOSIS 01/11/2008  . SKIN CANCER, HX OF 11/12/2008    R cheek 2010, L Cheek 2011 Dr. Tonia Brooms  . Thyroid nodule   . VENOUS INSUFFICIENCY 09/05/2007     Review of Systems  Constitutional: Negative for chills and fever.  Skin: Positive for wound.      Objective:    BP (!) 104/50 (BP Location: Left Arm, Patient Position: Sitting, Cuff Size: Normal)   Pulse (!) 44   Temp 97.6 F (36.4 C) (Oral)   Resp 18   Ht 5\' 3"  (1.6 m)   Wt 118 lb (53.5 kg)   SpO2 (!) 87%   BMI 20.90 kg/m  Nursing note and vital signs reviewed.  Physical Exam  Constitutional: She is oriented to person, place, and time. She appears well-developed and well-nourished. No distress.  Cardiovascular: Normal rate, regular rhythm, normal heart sounds and intact distal pulses.   Pulmonary/Chest: Effort normal and breath sounds normal.  Neurological: She is alert and oriented to person, place, and time.  Skin: Skin is warm and dry.  19 cm skin tear located on the posterior forearm from the elbow distal to the wrist. Small areas of bleeding noted. No evidence of infection.   Psychiatric: She has a normal mood and affect. Her behavior is normal. Judgment and thought content normal.       Assessment & Plan:   Problem List Items Addressed This Visit      Musculoskeletal and Integument   Noninfected skin tear of right leg - Primary    Wound appears to be slowly healing with no evidence of infection. Limit antibiotic cream. Continue basic wound care with soap and water. Continue to monitor.       Skin tear of left forearm  without complication    New skin tear to the left forearm with continued oozing appears stable. Cleansed with soap and water and dressed with a compression wrap. Instructed to ice and elevate. Concern for wound healing complicated by nutritional intake and Xarelto. Encouraged to increase protein intake and change dressing as needed. Will require close monitoring as she does have another wound that is slow to heal. Tetanus is up to date.        Other Visit Diagnoses   None.      I am having Ms. Difranco maintain her Calcium Carbonate-Vitamin D, Multiple Vitamins-Minerals (MULTIVITAMIN PO), Fluticasone-Salmeterol, ALPRAZolam, torsemide, amiodarone, vitamin C, bisoprolol, spironolactone, potassium chloride SA, and Rivaroxaban.   Follow-up: Return in about 2 weeks (around 04/27/2016), or if symptoms worsen or fail  to improve.  Mauricio Po, FNP

## 2016-04-14 ENCOUNTER — Encounter (HOSPITAL_COMMUNITY)
Admission: RE | Admit: 2016-04-14 | Discharge: 2016-04-14 | Disposition: A | Discharge status: Home / Self Care | Payer: Medicare Other | Source: Ambulatory Visit | Attending: Nephrology | Admitting: Nephrology

## 2016-04-14 ENCOUNTER — Encounter: Payer: Self-pay | Admitting: Internal Medicine

## 2016-04-14 ENCOUNTER — Ambulatory Visit (INDEPENDENT_AMBULATORY_CARE_PROVIDER_SITE_OTHER): Payer: Medicare Other | Admitting: Internal Medicine

## 2016-04-14 DIAGNOSIS — N184 Chronic kidney disease, stage 4 (severe): Secondary | ICD-10-CM | POA: Diagnosis not present

## 2016-04-14 DIAGNOSIS — S51802D Unspecified open wound of left forearm, subsequent encounter: Secondary | ICD-10-CM | POA: Diagnosis not present

## 2016-04-14 DIAGNOSIS — Z79899 Other long term (current) drug therapy: Secondary | ICD-10-CM | POA: Diagnosis not present

## 2016-04-14 DIAGNOSIS — S51812D Laceration without foreign body of left forearm, subsequent encounter: Secondary | ICD-10-CM

## 2016-04-14 DIAGNOSIS — Z5181 Encounter for therapeutic drug level monitoring: Secondary | ICD-10-CM | POA: Diagnosis not present

## 2016-04-14 DIAGNOSIS — D631 Anemia in chronic kidney disease: Secondary | ICD-10-CM | POA: Diagnosis not present

## 2016-04-14 LAB — IRON AND TIBC
IRON: 104 ug/dL (ref 28–170)
Saturation Ratios: 24 % (ref 10.4–31.8)
TIBC: 442 ug/dL (ref 250–450)
UIBC: 338 ug/dL

## 2016-04-14 LAB — POCT HEMOGLOBIN-HEMACUE: Hemoglobin: 10.8 g/dL — ABNORMAL LOW (ref 12.0–15.0)

## 2016-04-14 LAB — FERRITIN: FERRITIN: 623 ng/mL — AB (ref 11–307)

## 2016-04-14 MED ORDER — EPOETIN ALFA 10000 UNIT/ML IJ SOLN
INTRAMUSCULAR | Status: AC
  Administered 2016-04-14: 5000 [IU] via SUBCUTANEOUS
  Filled 2016-04-14: qty 1

## 2016-04-14 MED ORDER — EPOETIN ALFA 10000 UNIT/ML IJ SOLN: 5000.0000 [IU] | INTRAMUSCULAR | Status: DC

## 2016-04-14 NOTE — Patient Instructions (Signed)
We have wrapped the arm up again and leave this on until you see Dr. Alain Marion.   It is not bleeding today.  Fall Prevention in the Home  Falls can cause injuries and can affect people from all age groups. There are many simple things that you can do to make your home safe and to help prevent falls. WHAT CAN I DO ON THE OUTSIDE OF MY HOME?  Regularly repair the edges of walkways and driveways and fix any cracks.  Remove high doorway thresholds.  Trim any shrubbery on the main path into your home.  Use bright outdoor lighting.  Clear walkways of debris and clutter, including tools and rocks.  Regularly check that handrails are securely fastened and in good repair. Both sides of any steps should have handrails.  Install guardrails along the edges of any raised decks or porches.  Have leaves, snow, and ice cleared regularly.  Use sand or salt on walkways during winter months.  In the garage, clean up any spills right away, including grease or oil spills. WHAT CAN I DO IN THE BATHROOM?  Use night lights.  Install grab bars by the toilet and in the tub and shower. Do not use towel bars as grab bars.  Use non-skid mats or decals on the floor of the tub or shower.  If you need to sit down while you are in the shower, use a plastic, non-slip stool.Marland Kitchen  Keep the floor dry. Immediately clean up any water that spills on the floor.  Remove soap buildup in the tub or shower on a regular basis.  Attach bath mats securely with double-sided non-slip rug tape.  Remove throw rugs and other tripping hazards from the floor. WHAT CAN I DO IN THE BEDROOM?  Use night lights.  Make sure that a bedside light is easy to reach.  Do not use oversized bedding that drapes onto the floor.  Have a firm chair that has side arms to use for getting dressed.  Remove throw rugs and other tripping hazards from the floor. WHAT CAN I DO IN THE KITCHEN?   Clean up any spills right away.  Avoid  walking on wet floors.  Place frequently used items in easy-to-reach places.  If you need to reach for something above you, use a sturdy step stool that has a grab bar.  Keep electrical cables out of the way.  Do not use floor polish or wax that makes floors slippery. If you have to use wax, make sure that it is non-skid floor wax.  Remove throw rugs and other tripping hazards from the floor. WHAT CAN I DO IN THE STAIRWAYS?  Do not leave any items on the stairs.  Make sure that there are handrails on both sides of the stairs. Fix handrails that are broken or loose. Make sure that handrails are as long as the stairways.  Check any carpeting to make sure that it is firmly attached to the stairs. Fix any carpet that is loose or worn.  Avoid having throw rugs at the top or bottom of stairways, or secure the rugs with carpet tape to prevent them from moving.  Make sure that you have a light switch at the top of the stairs and the bottom of the stairs. If you do not have them, have them installed. WHAT ARE SOME OTHER FALL PREVENTION TIPS?  Wear closed-toe shoes that fit well and support your feet. Wear shoes that have rubber soles or low heels.  When you  use a stepladder, make sure that it is completely opened and that the sides are firmly locked. Have someone hold the ladder while you are using it. Do not climb a closed stepladder.  Add color or contrast paint or tape to grab bars and handrails in your home. Place contrasting color strips on the first and last steps.  Use mobility aids as needed, such as canes, walkers, scooters, and crutches.  Turn on lights if it is dark. Replace any light bulbs that burn out.  Set up furniture so that there are clear paths. Keep the furniture in the same spot.  Fix any uneven floor surfaces.  Choose a carpet design that does not hide the edge of steps of a stairway.  Be aware of any and all pets.  Review your medicines with your healthcare  provider. Some medicines can cause dizziness or changes in blood pressure, which increase your risk of falling. Talk with your health care provider about other ways that you can decrease your risk of falls. This may include working with a physical therapist or trainer to improve your strength, balance, and endurance.   This information is not intended to replace advice given to you by your health care provider. Make sure you discuss any questions you have with your health care provider.   Document Released: 07/28/2002 Document Revised: 12/22/2014 Document Reviewed: 09/11/2014 Elsevier Interactive Patient Education [employer].

## 2016-04-14 NOTE — Assessment & Plan Note (Signed)
She is on xarelto but it is not actively bleeding. It was wrapped today during the visit and she was advised to not unwrap until her visit with PCP on Sept 6th. No signs of infection. She can resume xarelto.

## 2016-04-14 NOTE — Progress Notes (Signed)
   Subjective:    Patient ID: Mia Contreras, female    DOB: Jun 19, 1929, 82 y.o.   MRN: [id number]  HPI The patient is an 82 YO female coming in for skin tear on her arm as well as a skin tear on her leg. She was seen yesterday after a fall and bandaged. She does insist on having it looked at again today due to some leaking of blood through the bandage. She also notes that the NP yesterday did not put any antibiotic ointment on it and this worries her. She takes anticoagulant xarelto which she is still taking. She tripped on a throw rug in the kitchen. Since she has removed this.   Review of Systems  Constitutional: Negative.   Respiratory: Negative.   Cardiovascular: Negative.   Gastrointestinal: Negative.   Musculoskeletal: Positive for gait problem and myalgias. Negative for arthralgias and back pain.  Skin: Positive for wound.      Objective:   Physical Exam  Constitutional: She appears well-developed and well-nourished. No distress.  HENT:  Head: Normocephalic and atraumatic.  Eyes: EOM are normal.  Cardiovascular: Normal rate.   Pulmonary/Chest: Effort normal and breath sounds normal.  Abdominal: Soft. She exhibits no distension. There is no tenderness.  Skin: Skin is warm and dry.  At her insistence we unwrapped the left forearm which revealed a 6 cm skin tear without sign of infection which was not actively bleeding. It was wrapped with some antibiotic ointment.    Vitals:   04/14/16 1601  BP: 140/60  Pulse: (!) 48  Resp: 18  Temp: 97.9 F (36.6 C)  TempSrc: Oral  SpO2: 90%  Weight: 118 lb 1.9 oz (53.6 kg)  Height: 5\' 3"  (1.6 m)      Assessment & Plan:

## 2016-04-14 NOTE — Progress Notes (Signed)
Pre visit review using our clinic review tool, if applicable. No additional management support is needed unless otherwise documented below in the visit note. 

## 2016-04-17 ENCOUNTER — Telehealth: Payer: Self-pay | Admitting: Internal Medicine

## 2016-04-17 ENCOUNTER — Ambulatory Visit (INDEPENDENT_AMBULATORY_CARE_PROVIDER_SITE_OTHER): Payer: Medicare Other | Admitting: Nurse Practitioner

## 2016-04-17 ENCOUNTER — Encounter: Payer: Self-pay | Admitting: Nurse Practitioner

## 2016-04-17 VITALS — BP 110/62 | HR 57 | Temp 97.8°F

## 2016-04-17 DIAGNOSIS — S81812A Laceration without foreign body, left lower leg, initial encounter: Secondary | ICD-10-CM | POA: Diagnosis not present

## 2016-04-17 DIAGNOSIS — S51802D Unspecified open wound of left forearm, subsequent encounter: Secondary | ICD-10-CM | POA: Diagnosis not present

## 2016-04-17 DIAGNOSIS — S51812D Laceration without foreign body of left forearm, subsequent encounter: Secondary | ICD-10-CM

## 2016-04-17 NOTE — Progress Notes (Signed)
Subjective:  Patient ID: Mia Contreras, female    DOB: 1928-11-03  Age: 82 y.o. MRN: [id number]  CC: Wound Check (multiple skin tear due to frail skin.)   Mia Contreras presents with new right lower leg skin tear and re eval of left fore arm skin tear. New skin tear was as results of hitting leg on base of walker last night. Denies any fall last night. Denies any pain, no fever, stopped bleeding with gauze. She lives alone and unable to change dressing on forearm.  Outpatient Medications Prior to Visit  Medication Sig Dispense Refill  . ALPRAZolam (XANAX) 0.5 MG tablet Take 1 tablet (0.5 mg total) by mouth 2 (two) times daily as needed for anxiety or sleep. 60 tablet 0  . amiodarone (PACERONE) 200 MG tablet TAKE 1 TABLET (200 MG TOTAL) BY MOUTH 2 (TWO) TIMES DAILY. 60 tablet 2  . bisoprolol (ZEBETA) 5 MG tablet Take 0.5 tablets (2.5 mg total) by mouth daily. 30 tablet 5  . Calcium Carbonate-Vitamin D (CALCIUM 600+D) 600-400 MG-UNIT per tablet Take 1 tablet by mouth daily.    . Fluticasone-Salmeterol (ADVAIR DISKUS) 250-50 MCG/DOSE AEPB Inhale 1 puff into the lungs 2 (two) times daily. 60 each 3  . Multiple Vitamins-Minerals (MULTIVITAMIN PO) Take 1 tablet by mouth daily.     . potassium chloride SA (K-DUR,KLOR-CON) 20 MEQ tablet Take 1 tablet (20 mEq total) by mouth 2 (two) times daily. 60 tablet 5  . Rivaroxaban (XARELTO) 15 MG TABS tablet Take 1 tablet (15 mg total) by mouth daily with supper. 90 tablet 3  . spironolactone (ALDACTONE) 25 MG tablet Take 1 tablet (25 mg total) by mouth daily. 30 tablet 5  . torsemide (DEMADEX) 20 MG tablet Take 2 tablets (40 mg total) by mouth 2 (two) times daily. 120 tablet 6  . vitamin C (ASCORBIC ACID) 500 MG tablet Take 1 tablet (500 mg total) by mouth daily. 100 tablet 3   No facility-administered medications prior to visit.     ROS Review of Systems  Constitutional: Negative for chills and fever.  Cardiovascular: Negative for chest pain.       Chronic LE edema (no change per patient)  Musculoskeletal: Negative for arthralgias.    Objective:  BP 110/62   Pulse (!) 57   Temp 97.8 F (36.6 C)   SpO2 90%   BP Readings from Last 3 Encounters:  04/17/16 110/62  04/14/16 (!) 101/37  04/14/16 140/60    Wt Readings from Last 3 Encounters:  04/14/16 118 lb 1.9 oz (53.6 kg)  04/13/16 118 lb (53.5 kg)  04/11/16 116 lb 12 oz (53 kg)    Physical Exam  Constitutional: She is oriented to person, place, and time.  Musculoskeletal: She exhibits edema. She exhibits no tenderness.  Neurological: She is alert and oriented to person, place, and time.  Skin: Skin is warm and dry.     Left Fore arm: wound appears clean, no sign of infection, no bleeding.  Wound Care provided and dressing changed: cleaned wound with distilled water, patted dry, covered with dry non adherent gauze and kerlex.  Right Lower leg: approx. 5cm long, no bleeding. Left calf: <2cm long, no bleeding Wound Care: cleaned wound with distilled water, patted dry, applied neosporin, edges approximated with steristrip, covered with non adherent gauze and kerlex.  Vitals reviewed.   Lab Results  Component Value Date   WBC 12.5 (H) 01/31/2016   HGB 10.8 (L) 04/14/2016   HCT 35.1 04/11/2016  PLT 364 01/31/2016   GLUCOSE 80 02/24/2016   CHOL 130 11/04/2015   TRIG 77.0 11/04/2015   HDL 54.50 11/04/2015   LDLCALC 60 11/04/2015   ALT 29 01/26/2016   AST 33 01/26/2016   NA 139 02/24/2016   K 4.2 02/24/2016   CL 102 02/24/2016   CREATININE 2.58 (H) 02/24/2016   BUN 39 (H) 02/24/2016   CO2 29 02/24/2016   TSH 0.592 01/26/2016   INR 0.95 10/25/2011   HGBA1C 5.8 02/06/2014   MICROALBUR 0.6 03/23/2011    No results found.  Assessment & Plan:   Mia Contreras was seen today for wound check.  Diagnoses and all orders for this visit:  Skin tear of lower leg without complication, left, initial encounter  Skin tear of left forearm without complication,  subsequent encounter   I am having Mia Contreras maintain her Calcium Carbonate-Vitamin D, Multiple Vitamins-Minerals (MULTIVITAMIN PO), Fluticasone-Salmeterol, ALPRAZolam, torsemide, amiodarone, vitamin C, bisoprolol, spironolactone, potassium chloride SA, and Rivaroxaban.  No orders of the defined types were placed in this encounter.   Follow-up: Return in about 3 days (around 04/20/2016) for dressing change and wound re eval.  Wilfred Lacy, NP

## 2016-04-17 NOTE — Progress Notes (Signed)
Pre visit review using our clinic review tool, if applicable. No additional management support is needed unless otherwise documented below in the visit note. 

## 2016-04-17 NOTE — Telephone Encounter (Signed)
NOTE: All timestamps contained within this report are represented as [place] Standard Time. CONFIDENTIALTY NOTICE: This fax transmission is intended only for the addressee. It contains information that is legally privileged, confidential or otherwise protected from use or disclosure. If you are not the intended recipient, you are strictly prohibited from reviewing, disclosing, copying using or disseminating any of this information or taking any action in reliance on or regarding this information. If you have received this fax in error, please notify us immediately by telephone so that we can arrange for its return to [name]. Phone: [phone], Toll-Free: [phone], Fax: [phone] Page: 1 of 1 Call Id: [id number] [place] [facility] - Client [place] Patient Name: Mia Contreras DOB: Sep 14, 1928 Initial Comment Caller States she has a wound on her arm and leg and she can't not get them to stop bleeding Nurse Assessment Nurse: Dimas Chyle, RN, Dellis Filbert Date/Time (Eastern Time): 04/17/2016 8:47:00 AM Confirm and document reason for call. If symptomatic, describe symptoms. You must click the next button to save text entered. ---Caller states she has a wound on her arm and leg and she can't not get them to stop bleeding. Has the patient traveled out of the country within the last 30 days? ---No Does the patient have any new or worsening symptoms? ---Yes Will a triage be completed? ---Yes Related visit to physician within the last 2 weeks? ---No Does the PT have any chronic conditions? (i.e. diabetes, asthma, etc.) ---Yes List chronic conditions. ---CHF Is this a behavioral health or substance abuse call? ---No Guidelines Guideline Title Affirmed Question Affirmed Notes Cuts and Lacerations [1] Bleeding AND [2] won't stop after 10 minutes of direct pressure (using correct technique) Final Disposition User Go to ED Now Dimas Chyle, RN,  Dellis Filbert Comments Caller is on blood thinner and dressing from previously dressed skin tear is saturated with blood. Caller refused ED outcome so I spoke with office and they scheduled appointment for caller to be seen in office with Baldo Ash at 10 a.m. Referrals GO TO FACILITY REFUSED Disagree/Comply: Comply

## 2016-04-17 NOTE — Patient Instructions (Signed)
Return to office on Thursday for dressing change and re eval of wounds. Keep dressing clean and dry at all times. Return to office sooner if any signs of infection: worsening swelling, redness, pain, fever, and purulent drainage.

## 2016-04-18 ENCOUNTER — Telehealth: Payer: Self-pay | Admitting: Cardiovascular Disease

## 2016-04-18 NOTE — Telephone Encounter (Signed)
New Message  Pt voiced someone called her but there's no encounter for this pts conversation.  Pt voiced she went to get something to write down and whoever was on the phone hung up.  Pt voiced it was for an ultrasound but I did not see order in for an ultrasound.  Pt voiced it was around 10 this morning and was hoping for someone to call back.  Please follow up with pt if needed. Thanks!

## 2016-04-19 NOTE — Telephone Encounter (Signed)
Informed patient that there is no record of someone calling her. Asked patient if she received her Lab results. Patient stated she did see them on MyChart. Patient stated she was doing good. Encouraged patient to call with any questions or concerns. Patient verbalized understanding.

## 2016-04-20 ENCOUNTER — Encounter: Payer: Self-pay | Admitting: Nurse Practitioner

## 2016-04-20 ENCOUNTER — Ambulatory Visit (INDEPENDENT_AMBULATORY_CARE_PROVIDER_SITE_OTHER): Payer: Medicare Other | Admitting: Nurse Practitioner

## 2016-04-20 DIAGNOSIS — Z23 Encounter for immunization: Secondary | ICD-10-CM | POA: Diagnosis not present

## 2016-04-20 DIAGNOSIS — S51802D Unspecified open wound of left forearm, subsequent encounter: Secondary | ICD-10-CM

## 2016-04-20 DIAGNOSIS — S51812D Laceration without foreign body of left forearm, subsequent encounter: Secondary | ICD-10-CM

## 2016-04-20 DIAGNOSIS — S81812D Laceration without foreign body, left lower leg, subsequent encounter: Secondary | ICD-10-CM

## 2016-04-20 NOTE — Progress Notes (Signed)
I checked all skin tear areas and reinforced loose gauze with new gauze wrap---only small area 5cm stained under wrapped gauze---patient advised to leave bandages wrapped, constantly changing bandages only reopens areas already torn, making them more susceptible to infection and bleeding---patient was reminded to keep appt with dr plotnikiov next week---dr plotnikov may want to check under bandages then and may also want to consider stopping/changing anti-coag med if bleeding is not controlled

## 2016-04-21 ENCOUNTER — Ambulatory Visit: Payer: Self-pay

## 2016-04-21 ENCOUNTER — Encounter (HOSPITAL_COMMUNITY)
Admission: RE | Admit: 2016-04-21 | Discharge: 2016-04-21 | Disposition: A | Discharge status: Home / Self Care | Payer: Medicare Other | Source: Ambulatory Visit | Attending: Nephrology | Admitting: Nephrology

## 2016-04-21 ENCOUNTER — Other Ambulatory Visit: Payer: Self-pay | Admitting: *Deleted

## 2016-04-21 DIAGNOSIS — Z5181 Encounter for therapeutic drug level monitoring: Secondary | ICD-10-CM | POA: Diagnosis not present

## 2016-04-21 DIAGNOSIS — Z79899 Other long term (current) drug therapy: Secondary | ICD-10-CM | POA: Diagnosis not present

## 2016-04-21 DIAGNOSIS — N184 Chronic kidney disease, stage 4 (severe): Secondary | ICD-10-CM | POA: Insufficient documentation

## 2016-04-21 DIAGNOSIS — D631 Anemia in chronic kidney disease: Secondary | ICD-10-CM | POA: Insufficient documentation

## 2016-04-21 LAB — POCT HEMOGLOBIN-HEMACUE: HEMOGLOBIN: 10.4 g/dL — AB (ref 12.0–15.0)

## 2016-04-21 MED ORDER — EPOETIN ALFA 20000 UNIT/ML IJ SOLN
INTRAMUSCULAR | Status: AC
  Filled 2016-04-21: qty 1

## 2016-04-21 MED ORDER — EPOETIN ALFA 10000 UNIT/ML IJ SOLN
5000.0000 [IU] | INTRAMUSCULAR | Status: DC
  Administered 2016-04-21: 5000 [IU] via SUBCUTANEOUS

## 2016-04-21 NOTE — Patient Outreach (Signed)
[place] [facility]) Care Management [place] Telephone Outreach, Transition of Care 04/21/2016  Mia Contreras Jun 29, 1929 [id number]  Successful telephone outreach to Mia Contreras, 82 y/o female followed by [place] after recent inpatient hospital visit for CHF; patient was discharged to SNF and is now back at home.   Today, patient reports that she "is doing great," and "having no problems."  Patient states that she continues taking her medications as they are prescribed, and is attending scheduled provider appointments, reporting that she saw the nurse yesterday to have her skin tears evaluated and re-dressed.  Patient reports that she has continued monitoring and recording her daily weights, and reports a weight this morning of 115.8 lbs, stating that her general weight range "stays between 114-116 lbs."  Patient provides an accurate report of her upcoming provider appointments, and states that "seeing doctors has now become my only social life."  Patient states that she drives herself to her provider appointments.  Mia Contreras denies further concerns, needs, issues, or problems today.  I explained to patient that I was calling her on behalf of her primary [place], Mia Contreras, and let her know that I would make [place] aware of our conversation today.  I asked patient if she would like to have a [place] CM in-home visit with Mia Contreras in the future, as patient has been followed telephonically for transition of care thus far.  Patient said that she would like to "think about it," and asked that [place] call her back in the future to discuss.  Plan:  I will make [place] aware of successful telephone outreach with patient today.   Mia Rack, RN, BSN, [employer] [facility] Care Management  [phone]

## 2016-04-26 ENCOUNTER — Ambulatory Visit (INDEPENDENT_AMBULATORY_CARE_PROVIDER_SITE_OTHER): Payer: Medicare Other | Admitting: Internal Medicine

## 2016-04-26 ENCOUNTER — Encounter: Payer: Self-pay | Admitting: Internal Medicine

## 2016-04-26 DIAGNOSIS — I10 Essential (primary) hypertension: Secondary | ICD-10-CM | POA: Diagnosis not present

## 2016-04-26 DIAGNOSIS — F411 Generalized anxiety disorder: Secondary | ICD-10-CM | POA: Diagnosis not present

## 2016-04-26 DIAGNOSIS — I5042 Chronic combined systolic (congestive) and diastolic (congestive) heart failure: Secondary | ICD-10-CM

## 2016-04-26 DIAGNOSIS — I428 Other cardiomyopathies: Secondary | ICD-10-CM

## 2016-04-26 DIAGNOSIS — I429 Cardiomyopathy, unspecified: Secondary | ICD-10-CM

## 2016-04-26 DIAGNOSIS — S81801A Unspecified open wound, right lower leg, initial encounter: Secondary | ICD-10-CM | POA: Diagnosis not present

## 2016-04-26 DIAGNOSIS — I480 Paroxysmal atrial fibrillation: Secondary | ICD-10-CM

## 2016-04-26 MED ORDER — MUPIROCIN 2 % EX OINT: TOPICAL_OINTMENT | CUTANEOUS | 2 refills | Status: DC

## 2016-04-26 NOTE — Assessment & Plan Note (Signed)
On O2 prn Torsemide, Aldactone, Zebeta, Xarelto Labs

## 2016-04-26 NOTE — Assessment & Plan Note (Signed)
Potential benefits of a long term benzodiazepines  use as well as potential risks  and complications were explained to the patient and were aknowledged. Alprazolam prn  

## 2016-04-26 NOTE — Progress Notes (Signed)
Subjective:  Patient ID: Mia Contreras, female    DOB: 1929/07/29  Age: 82 y.o. MRN: [id number]  CC: No chief complaint on file.   HPI Mia Contreras presents for CHF, edema, anticoagulation. C/o wounds on L forearm and LLE.  Outpatient Medications Prior to Visit  Medication Sig Dispense Refill  . ALPRAZolam (XANAX) 0.5 MG tablet Take 1 tablet (0.5 mg total) by mouth 2 (two) times daily as needed for anxiety or sleep. 60 tablet 0  . amiodarone (PACERONE) 200 MG tablet TAKE 1 TABLET (200 MG TOTAL) BY MOUTH 2 (TWO) TIMES DAILY. 60 tablet 2  . bisoprolol (ZEBETA) 5 MG tablet Take 0.5 tablets (2.5 mg total) by mouth daily. 30 tablet 5  . Calcium Carbonate-Vitamin D (CALCIUM 600+D) 600-400 MG-UNIT per tablet Take 1 tablet by mouth daily.    . Fluticasone-Salmeterol (ADVAIR DISKUS) 250-50 MCG/DOSE AEPB Inhale 1 puff into the lungs 2 (two) times daily. 60 each 3  . Multiple Vitamins-Minerals (MULTIVITAMIN PO) Take 1 tablet by mouth daily.     . potassium chloride SA (K-DUR,KLOR-CON) 20 MEQ tablet Take 1 tablet (20 mEq total) by mouth 2 (two) times daily. 60 tablet 5  . Rivaroxaban (XARELTO) 15 MG TABS tablet Take 1 tablet (15 mg total) by mouth daily with supper. 90 tablet 3  . spironolactone (ALDACTONE) 25 MG tablet Take 1 tablet (25 mg total) by mouth daily. 30 tablet 5  . torsemide (DEMADEX) 20 MG tablet Take 2 tablets (40 mg total) by mouth 2 (two) times daily. 120 tablet 6  . vitamin C (ASCORBIC ACID) 500 MG tablet Take 1 tablet (500 mg total) by mouth daily. 100 tablet 3   No facility-administered medications prior to visit.     ROS Review of Systems  Constitutional: Negative for activity change, appetite change, chills, fatigue and unexpected weight change.  HENT: Negative for congestion, mouth sores and sinus pressure.   Eyes: Negative for visual disturbance.  Respiratory: Negative for cough and chest tightness.   Gastrointestinal: Negative for abdominal pain and nausea.    Genitourinary: Negative for difficulty urinating, frequency and vaginal pain.  Musculoskeletal: Negative for back pain and gait problem.  Skin: Negative for pallor and rash.  Neurological: Positive for weakness. Negative for dizziness, tremors, numbness and headaches.  Hematological: Bruises/bleeds easily.  Psychiatric/Behavioral: Negative for confusion and sleep disturbance.  wounds on L forearm and LLE  Objective:  BP 122/64   Pulse (!) 44   Wt 118 lb (53.5 kg)   SpO2 94%   BMI 20.90 kg/m   BP Readings from Last 3 Encounters:  04/26/16 122/64  04/21/16 (!) 121/46  04/17/16 110/62    Wt Readings from Last 3 Encounters:  04/26/16 118 lb (53.5 kg)  04/14/16 118 lb 1.9 oz (53.6 kg)  04/13/16 118 lb (53.5 kg)    Physical Exam  Constitutional: She appears well-developed. No distress.  HENT:  Head: Normocephalic.  Right Ear: External ear normal.  Left Ear: External ear normal.  Nose: Nose normal.  Mouth/Throat: Oropharynx is clear and moist.  Eyes: Conjunctivae are normal. Pupils are equal, round, and reactive to light. Right eye exhibits no discharge. Left eye exhibits no discharge.  Neck: Normal range of motion. Neck supple. No JVD present. No tracheal deviation present. No thyromegaly present.  Cardiovascular: Normal rate, regular rhythm and normal heart sounds.   Pulmonary/Chest: No stridor. No respiratory distress. She has no wheezes.  Abdominal: Soft. Bowel sounds are normal. She exhibits no distension and no  mass. There is no tenderness. There is no rebound and no guarding.  Musculoskeletal: She exhibits no edema or tenderness.  Lymphadenopathy:    She has no cervical adenopathy.  Neurological: She displays normal reflexes. No cranial nerve deficit. She exhibits normal muscle tone. Coordination abnormal.  Skin: No rash noted. There is erythema.  Psychiatric: She has a normal mood and affect. Her behavior is normal. Judgment and thought content normal.  wounds on L  forearm - 15 cm and LLE - deep 6 cm w/o signs of infection  Wounds dressed   Lab Results  Component Value Date   WBC 12.5 (H) 01/31/2016   HGB 10.4 (L) 04/21/2016   HCT 35.1 04/11/2016   PLT 364 01/31/2016   GLUCOSE 80 02/24/2016   CHOL 130 11/04/2015   TRIG 77.0 11/04/2015   HDL 54.50 11/04/2015   LDLCALC 60 11/04/2015   ALT 29 01/26/2016   AST 33 01/26/2016   NA 139 02/24/2016   K 4.2 02/24/2016   CL 102 02/24/2016   CREATININE 2.58 (H) 02/24/2016   BUN 39 (H) 02/24/2016   CO2 29 02/24/2016   TSH 0.592 01/26/2016   INR 0.95 10/25/2011   HGBA1C 5.8 02/06/2014   MICROALBUR 0.6 03/23/2011    No results found.  Assessment & Plan:   There are no diagnoses linked to this encounter. I am having Mia Contreras maintain her Calcium Carbonate-Vitamin D, Multiple Vitamins-Minerals (MULTIVITAMIN PO), Fluticasone-Salmeterol, ALPRAZolam, torsemide, amiodarone, vitamin C, bisoprolol, spironolactone, potassium chloride SA, and Rivaroxaban.  No orders of the defined types were placed in this encounter.    Follow-up: No Follow-up on file.  Walker Kehr, MD

## 2016-04-26 NOTE — Progress Notes (Signed)
Pre visit review using our clinic review tool, if applicable. No additional management support is needed unless otherwise documented below in the visit note. 

## 2016-04-26 NOTE — Assessment & Plan Note (Signed)
Demadex, Zebeta, Spironolactone 

## 2016-04-26 NOTE — Assessment & Plan Note (Signed)
On Xarelto 

## 2016-04-26 NOTE — Assessment & Plan Note (Signed)
Monitor labs 

## 2016-04-26 NOTE — Assessment & Plan Note (Signed)
RLE Will dress

## 2016-04-28 ENCOUNTER — Other Ambulatory Visit: Payer: Self-pay

## 2016-04-28 NOTE — Patient Outreach (Signed)
[place] [facility]) Care Management  04/28/2016  Mia Contreras December 08, 1928 [id number]  RNCM called to follow up regarding care management needs. Member stated she had just finished showering, but stopped to talk with Care coordinator. Member denies any needs at this time. Reports she follows up with her doctor visits and that she is doing alright. Member continues to weigh self daily weight today 114.6 pounds. Denies any issues or concerns.   RNCM discussed both case closure and health coach. Member decline health coach preferring case closure at this time.   Member is aware that she can call RNCM if needs change. Primary care may also refer if member's needs change.   Plan: close case.  Thea Silversmith, RN, MSN, [place] Coordinator Cell: [phone]

## 2016-05-01 ENCOUNTER — Encounter (HOSPITAL_COMMUNITY)
Admission: RE | Admit: 2016-05-01 | Discharge: 2016-05-01 | Disposition: A | Discharge status: Home / Self Care | Payer: Medicare Other | Source: Ambulatory Visit | Attending: Nephrology | Admitting: Nephrology

## 2016-05-01 ENCOUNTER — Other Ambulatory Visit: Payer: Self-pay

## 2016-05-01 DIAGNOSIS — N184 Chronic kidney disease, stage 4 (severe): Secondary | ICD-10-CM

## 2016-05-01 DIAGNOSIS — D631 Anemia in chronic kidney disease: Secondary | ICD-10-CM | POA: Diagnosis not present

## 2016-05-01 DIAGNOSIS — Z5181 Encounter for therapeutic drug level monitoring: Secondary | ICD-10-CM | POA: Diagnosis not present

## 2016-05-01 DIAGNOSIS — Z79899 Other long term (current) drug therapy: Secondary | ICD-10-CM | POA: Diagnosis not present

## 2016-05-01 LAB — POCT HEMOGLOBIN-HEMACUE: Hemoglobin: 9.8 g/dL — ABNORMAL LOW (ref 12.0–15.0)

## 2016-05-01 MED ORDER — EPOETIN ALFA 10000 UNIT/ML IJ SOLN
INTRAMUSCULAR | Status: AC
  Filled 2016-05-01: qty 1

## 2016-05-01 MED ORDER — EPOETIN ALFA 10000 UNIT/ML IJ SOLN
5000.0000 [IU] | INTRAMUSCULAR | Status: DC
  Administered 2016-05-01: 5000 [IU] via SUBCUTANEOUS

## 2016-05-02 ENCOUNTER — Ambulatory Visit: Payer: Medicare Other | Admitting: Cardiovascular Disease

## 2016-05-04 ENCOUNTER — Encounter (HOSPITAL_BASED_OUTPATIENT_CLINIC_OR_DEPARTMENT_OTHER): Payer: Medicare Other

## 2016-05-08 ENCOUNTER — Encounter (HOSPITAL_COMMUNITY)
Admission: RE | Admit: 2016-05-08 | Discharge: 2016-05-08 | Disposition: A | Discharge status: Home / Self Care | Payer: Medicare Other | Source: Ambulatory Visit | Attending: Nephrology | Admitting: Nephrology

## 2016-05-08 DIAGNOSIS — D631 Anemia in chronic kidney disease: Secondary | ICD-10-CM | POA: Diagnosis not present

## 2016-05-08 DIAGNOSIS — Z5181 Encounter for therapeutic drug level monitoring: Secondary | ICD-10-CM | POA: Diagnosis not present

## 2016-05-08 DIAGNOSIS — N184 Chronic kidney disease, stage 4 (severe): Secondary | ICD-10-CM | POA: Diagnosis not present

## 2016-05-08 DIAGNOSIS — Z79899 Other long term (current) drug therapy: Secondary | ICD-10-CM | POA: Diagnosis not present

## 2016-05-08 MED ORDER — EPOETIN ALFA 10000 UNIT/ML IJ SOLN
5000.0000 [IU] | INTRAMUSCULAR | Status: DC
  Administered 2016-05-08: 5000 [IU] via SUBCUTANEOUS

## 2016-05-08 MED ORDER — EPOETIN ALFA 10000 UNIT/ML IJ SOLN
INTRAMUSCULAR | Status: AC
Start: 2016-05-08 — End: 2016-05-08
  Administered 2016-05-08: 5000 [IU] via SUBCUTANEOUS
  Filled 2016-05-08: qty 1

## 2016-05-09 DIAGNOSIS — Z1231 Encounter for screening mammogram for malignant neoplasm of breast: Secondary | ICD-10-CM | POA: Diagnosis not present

## 2016-05-09 DIAGNOSIS — Z853 Personal history of malignant neoplasm of breast: Secondary | ICD-10-CM | POA: Diagnosis not present

## 2016-05-09 LAB — POCT HEMOGLOBIN-HEMACUE: Hemoglobin: 9.2 g/dL — ABNORMAL LOW (ref 12.0–15.0)

## 2016-05-09 LAB — HM MAMMOGRAPHY

## 2016-05-10 ENCOUNTER — Encounter: Payer: Self-pay | Admitting: Internal Medicine

## 2016-05-12 ENCOUNTER — Encounter: Payer: Self-pay | Admitting: Internal Medicine

## 2016-05-12 ENCOUNTER — Ambulatory Visit (INDEPENDENT_AMBULATORY_CARE_PROVIDER_SITE_OTHER): Payer: Medicare Other | Admitting: Internal Medicine

## 2016-05-12 DIAGNOSIS — I5022 Chronic systolic (congestive) heart failure: Secondary | ICD-10-CM

## 2016-05-12 DIAGNOSIS — I10 Essential (primary) hypertension: Secondary | ICD-10-CM

## 2016-05-12 DIAGNOSIS — S81809D Unspecified open wound, unspecified lower leg, subsequent encounter: Secondary | ICD-10-CM | POA: Diagnosis not present

## 2016-05-12 DIAGNOSIS — S91009D Unspecified open wound, unspecified ankle, subsequent encounter: Secondary | ICD-10-CM

## 2016-05-12 DIAGNOSIS — S81009D Unspecified open wound, unspecified knee, subsequent encounter: Secondary | ICD-10-CM | POA: Diagnosis not present

## 2016-05-12 NOTE — Progress Notes (Signed)
Subjective:  Patient ID: Mia Contreras, female    DOB: July 11, 1929  Age: 82 y.o. MRN: [id number]  CC: No chief complaint on file.   HPI Mia Contreras presents for R LE shin wound x 6 weeks. It started to burn 2 weeks ago. Fu CHF  Outpatient Medications Prior to Visit  Medication Sig Dispense Refill  . ALPRAZolam (XANAX) 0.5 MG tablet Take 1 tablet (0.5 mg total) by mouth 2 (two) times daily as needed for anxiety or sleep. 60 tablet 0  . amiodarone (PACERONE) 200 MG tablet TAKE 1 TABLET (200 MG TOTAL) BY MOUTH 2 (TWO) TIMES DAILY. 60 tablet 2  . bisoprolol (ZEBETA) 5 MG tablet Take 0.5 tablets (2.5 mg total) by mouth daily. 30 tablet 5  . Calcium Carbonate-Vitamin D (CALCIUM 600+D) 600-400 MG-UNIT per tablet Take 1 tablet by mouth daily.    . Fluticasone-Salmeterol (ADVAIR DISKUS) 250-50 MCG/DOSE AEPB Inhale 1 puff into the lungs 2 (two) times daily. 60 each 3  . Multiple Vitamins-Minerals (MULTIVITAMIN PO) Take 1 tablet by mouth daily.     . mupirocin ointment (BACTROBAN) 2 % Use qd 30 g 2  . potassium chloride SA (K-DUR,KLOR-CON) 20 MEQ tablet Take 1 tablet (20 mEq total) by mouth 2 (two) times daily. 60 tablet 5  . Rivaroxaban (XARELTO) 15 MG TABS tablet Take 1 tablet (15 mg total) by mouth daily with supper. 90 tablet 3  . spironolactone (ALDACTONE) 25 MG tablet Take 1 tablet (25 mg total) by mouth daily. 30 tablet 5  . torsemide (DEMADEX) 20 MG tablet Take 2 tablets (40 mg total) by mouth 2 (two) times daily. 120 tablet 6  . vitamin C (ASCORBIC ACID) 500 MG tablet Take 1 tablet (500 mg total) by mouth daily. 100 tablet 3   No facility-administered medications prior to visit.     ROS Review of Systems  Constitutional: Negative for activity change, appetite change, chills, fatigue and unexpected weight change.  HENT: Negative for congestion, mouth sores and sinus pressure.   Eyes: Negative for visual disturbance.  Respiratory: Negative for cough and chest tightness.     Gastrointestinal: Negative for abdominal pain and nausea.  Genitourinary: Negative for difficulty urinating, frequency and vaginal pain.  Musculoskeletal: Negative for back pain and gait problem.  Skin: Positive for wound. Negative for pallor and rash.  Neurological: Negative for dizziness, tremors, weakness, numbness and headaches.  Psychiatric/Behavioral: Negative for confusion and sleep disturbance.    Objective:  BP (!) 118/58   Pulse (!) 45   Wt 118 lb (53.5 kg)   SpO2 (!) 88% Comment: Placed pt on 2L O2, O2Sat increased to 94% in less than 87min  BMI 20.90 kg/m   BP Readings from Last 3 Encounters:  05/12/16 (!) 118/58  05/08/16 (!) 123/32  05/01/16 (!) 119/51    Wt Readings from Last 3 Encounters:  05/12/16 118 lb (53.5 kg)  04/26/16 118 lb (53.5 kg)  04/14/16 118 lb 1.9 oz (53.6 kg)    Physical Exam  Constitutional: She appears well-developed. No distress.  HENT:  Head: Normocephalic.  Right Ear: External ear normal.  Left Ear: External ear normal.  Nose: Nose normal.  Mouth/Throat: Oropharynx is clear and moist.  Eyes: Conjunctivae are normal. Pupils are equal, round, and reactive to light. Right eye exhibits no discharge. Left eye exhibits no discharge.  Neck: Normal range of motion. Neck supple. No JVD present. No tracheal deviation present. No thyromegaly present.  Cardiovascular: Normal rate, regular rhythm and normal  heart sounds.   Pulmonary/Chest: No stridor. No respiratory distress. She has no wheezes.  Abdominal: Soft. Bowel sounds are normal. She exhibits no distension and no mass. There is no tenderness. There is no rebound and no guarding.  Musculoskeletal: She exhibits edema. She exhibits no tenderness.  Lymphadenopathy:    She has no cervical adenopathy.  Neurological: She displays normal reflexes. No cranial nerve deficit. She exhibits normal muscle tone. Coordination normal.  Skin: No rash noted. No erythema.  Psychiatric: She has a normal mood  and affect. Her behavior is normal. Judgment and thought content normal.  R dist shin w/a wound; scabs in the middle Ankles wit 1+ edema  Lab Results  Component Value Date   WBC 12.5 (H) 01/31/2016   HGB 9.2 (L) 05/08/2016   HCT 35.1 04/11/2016   PLT 364 01/31/2016   GLUCOSE 80 02/24/2016   CHOL 130 11/04/2015   TRIG 77.0 11/04/2015   HDL 54.50 11/04/2015   LDLCALC 60 11/04/2015   ALT 29 01/26/2016   AST 33 01/26/2016   NA 139 02/24/2016   K 4.2 02/24/2016   CL 102 02/24/2016   CREATININE 2.58 (H) 02/24/2016   BUN 39 (H) 02/24/2016   CO2 29 02/24/2016   TSH 0.592 01/26/2016   INR 0.95 10/25/2011   HGBA1C 5.8 02/06/2014   MICROALBUR 0.6 03/23/2011    No results found.  Assessment & Plan:   There are no diagnoses linked to this encounter. I am having Ms. Doe maintain her Calcium Carbonate-Vitamin D, Multiple Vitamins-Minerals (MULTIVITAMIN PO), Fluticasone-Salmeterol, ALPRAZolam, torsemide, amiodarone, vitamin C, bisoprolol, spironolactone, potassium chloride SA, Rivaroxaban, and mupirocin ointment.  No orders of the defined types were placed in this encounter.    Follow-up: No Follow-up on file.  Walker Kehr, MD

## 2016-05-12 NOTE — Assessment & Plan Note (Signed)
Demadex, Zebeta, Spironolactone

## 2016-05-12 NOTE — Patient Instructions (Signed)
Reduce salt

## 2016-05-12 NOTE — Assessment & Plan Note (Signed)
On Eliquis, Zebeta, Torsemide, amiodarone, spironolactone Low salt diet discussed

## 2016-05-12 NOTE — Progress Notes (Signed)
Pre visit review using our clinic review tool, if applicable. No additional management support is needed unless otherwise documented below in the visit note. 

## 2016-05-12 NOTE — Assessment & Plan Note (Addendum)
Slow healing No signs of infection Cont w/Bactroban oint and Telfa Wound clinic ref Reduce salt

## 2016-05-15 ENCOUNTER — Telehealth (HOSPITAL_COMMUNITY): Payer: Self-pay | Admitting: Surgery

## 2016-05-15 ENCOUNTER — Encounter (HOSPITAL_COMMUNITY)
Admission: RE | Admit: 2016-05-15 | Discharge: 2016-05-15 | Disposition: A | Discharge status: Home / Self Care | Payer: Medicare Other | Source: Ambulatory Visit | Attending: Nephrology | Admitting: Nephrology

## 2016-05-15 DIAGNOSIS — D631 Anemia in chronic kidney disease: Secondary | ICD-10-CM | POA: Diagnosis not present

## 2016-05-15 DIAGNOSIS — N184 Chronic kidney disease, stage 4 (severe): Secondary | ICD-10-CM

## 2016-05-15 DIAGNOSIS — Z79899 Other long term (current) drug therapy: Secondary | ICD-10-CM | POA: Diagnosis not present

## 2016-05-15 DIAGNOSIS — Z5181 Encounter for therapeutic drug level monitoring: Secondary | ICD-10-CM | POA: Diagnosis not present

## 2016-05-15 LAB — FERRITIN: FERRITIN: 74 ng/mL (ref 11–307)

## 2016-05-15 LAB — IRON AND TIBC
Iron: 69 ug/dL (ref 28–170)
Saturation Ratios: 16 % (ref 10.4–31.8)
TIBC: 437 ug/dL (ref 250–450)
UIBC: 368 ug/dL

## 2016-05-15 LAB — POCT HEMOGLOBIN-HEMACUE: HEMOGLOBIN: 9.7 g/dL — AB (ref 12.0–15.0)

## 2016-05-15 MED ORDER — EPOETIN ALFA 10000 UNIT/ML IJ SOLN
INTRAMUSCULAR | Status: DC
Start: 2016-05-15 — End: 2016-05-16
  Filled 2016-05-15: qty 1

## 2016-05-15 MED ORDER — EPOETIN ALFA 10000 UNIT/ML IJ SOLN
5000.0000 [IU] | INTRAMUSCULAR | Status: DC
  Administered 2016-05-15: 5000 [IU] via SUBCUTANEOUS

## 2016-05-15 NOTE — Telephone Encounter (Signed)
Ms. Horvath refuses the [facility] at this time.

## 2016-05-16 ENCOUNTER — Telehealth (HOSPITAL_COMMUNITY): Payer: Self-pay | Admitting: Pharmacist

## 2016-05-16 ENCOUNTER — Other Ambulatory Visit: Payer: Self-pay | Admitting: Internal Medicine

## 2016-05-16 MED ORDER — APIXABAN 2.5 MG PO TABS: 2.5000 mg | ORAL_TABLET | Freq: Two times a day (BID) | ORAL | 5 refills | Status: DC

## 2016-05-16 NOTE — Addendum Note (Signed)
Addended by: Adora Fridge on: 05/16/2016 01:53 PM   Modules accepted: Orders

## 2016-05-16 NOTE — Telephone Encounter (Addendum)
Mia Contreras left a VM on the pharmacy line stating that she may be switched back to Eliquis and wanted to make sure it would only by $45/mo again. If she still has [employer] insurance, it should be no more than $45/mo as long as she is not in the coverage gap or does not have a deductible to meet. Attempted to call her with this information but she did not answer and no VM to leave her a message on.   ---   Spoke with Mia Contreras who states that she would like to go back on Eliquis instead of the Xarelto since she is still having bruising with the Xarelto. She has been using Xarelto samples and will run out soon. I explained the above regarding coverage which would be the same for Xarelto and will call in the Rx for Eliquis. She verbalized to me that she understands not to take both at the same time.   Mia Contreras, PharmD, BCPS, CPP Clinical Pharmacist Pager: [phone] Phone: [phone] 05/16/2016 10:10 AM

## 2016-05-16 NOTE — Telephone Encounter (Signed)
Ok to go back on eliquis

## 2016-05-18 NOTE — Telephone Encounter (Signed)
Done

## 2016-05-22 ENCOUNTER — Ambulatory Visit (HOSPITAL_COMMUNITY)
Admission: RE | Admit: 2016-05-22 | Discharge: 2016-05-22 | Disposition: A | Discharge status: Home / Self Care | Payer: Medicare Other | Source: Ambulatory Visit | Attending: Nephrology | Admitting: Nephrology

## 2016-05-22 DIAGNOSIS — D638 Anemia in other chronic diseases classified elsewhere: Secondary | ICD-10-CM | POA: Diagnosis not present

## 2016-05-22 DIAGNOSIS — N184 Chronic kidney disease, stage 4 (severe): Secondary | ICD-10-CM | POA: Diagnosis not present

## 2016-05-22 LAB — POCT HEMOGLOBIN-HEMACUE: Hemoglobin: 9.3 g/dL — ABNORMAL LOW (ref 12.0–15.0)

## 2016-05-22 MED ORDER — EPOETIN ALFA 10000 UNIT/ML IJ SOLN
5000.0000 [IU] | INTRAMUSCULAR | Status: DC
  Administered 2016-05-22: 5000 [IU] via SUBCUTANEOUS

## 2016-05-22 MED ORDER — EPOETIN ALFA 10000 UNIT/ML IJ SOLN
INTRAMUSCULAR | Status: DC
Start: 2016-05-22 — End: 2016-05-23
  Filled 2016-05-22: qty 1

## 2016-05-25 ENCOUNTER — Encounter (HOSPITAL_BASED_OUTPATIENT_CLINIC_OR_DEPARTMENT_OTHER): Payer: Medicare Other | Attending: Internal Medicine

## 2016-05-25 DIAGNOSIS — Z9221 Personal history of antineoplastic chemotherapy: Secondary | ICD-10-CM | POA: Insufficient documentation

## 2016-05-25 DIAGNOSIS — Z96653 Presence of artificial knee joint, bilateral: Secondary | ICD-10-CM | POA: Insufficient documentation

## 2016-05-25 DIAGNOSIS — L97215 Non-pressure chronic ulcer of right calf with muscle involvement without evidence of necrosis: Secondary | ICD-10-CM | POA: Diagnosis not present

## 2016-05-25 DIAGNOSIS — I5023 Acute on chronic systolic (congestive) heart failure: Secondary | ICD-10-CM | POA: Insufficient documentation

## 2016-05-25 DIAGNOSIS — L97821 Non-pressure chronic ulcer of other part of left lower leg limited to breakdown of skin: Secondary | ICD-10-CM | POA: Insufficient documentation

## 2016-05-25 DIAGNOSIS — L97811 Non-pressure chronic ulcer of other part of right lower leg limited to breakdown of skin: Secondary | ICD-10-CM | POA: Insufficient documentation

## 2016-05-25 DIAGNOSIS — S81811A Laceration without foreign body, right lower leg, initial encounter: Secondary | ICD-10-CM | POA: Insufficient documentation

## 2016-05-25 DIAGNOSIS — M1990 Unspecified osteoarthritis, unspecified site: Secondary | ICD-10-CM | POA: Insufficient documentation

## 2016-05-25 DIAGNOSIS — I87313 Chronic venous hypertension (idiopathic) with ulcer of bilateral lower extremity: Secondary | ICD-10-CM | POA: Diagnosis not present

## 2016-05-25 DIAGNOSIS — S8012XA Contusion of left lower leg, initial encounter: Secondary | ICD-10-CM | POA: Insufficient documentation

## 2016-05-25 DIAGNOSIS — W19XXXA Unspecified fall, initial encounter: Secondary | ICD-10-CM | POA: Insufficient documentation

## 2016-05-25 DIAGNOSIS — Z923 Personal history of irradiation: Secondary | ICD-10-CM | POA: Diagnosis not present

## 2016-05-25 DIAGNOSIS — Z87891 Personal history of nicotine dependence: Secondary | ICD-10-CM | POA: Diagnosis not present

## 2016-05-25 DIAGNOSIS — I130 Hypertensive heart and chronic kidney disease with heart failure and stage 1 through stage 4 chronic kidney disease, or unspecified chronic kidney disease: Secondary | ICD-10-CM | POA: Diagnosis not present

## 2016-05-25 DIAGNOSIS — L97212 Non-pressure chronic ulcer of right calf with fat layer exposed: Secondary | ICD-10-CM | POA: Diagnosis not present

## 2016-05-25 DIAGNOSIS — J449 Chronic obstructive pulmonary disease, unspecified: Secondary | ICD-10-CM | POA: Diagnosis not present

## 2016-05-25 DIAGNOSIS — Z7901 Long term (current) use of anticoagulants: Secondary | ICD-10-CM | POA: Insufficient documentation

## 2016-05-25 DIAGNOSIS — R609 Edema, unspecified: Secondary | ICD-10-CM | POA: Insufficient documentation

## 2016-05-25 DIAGNOSIS — I87333 Chronic venous hypertension (idiopathic) with ulcer and inflammation of bilateral lower extremity: Secondary | ICD-10-CM | POA: Insufficient documentation

## 2016-05-25 DIAGNOSIS — L97229 Non-pressure chronic ulcer of left calf with unspecified severity: Secondary | ICD-10-CM | POA: Insufficient documentation

## 2016-05-25 DIAGNOSIS — Z86718 Personal history of other venous thrombosis and embolism: Secondary | ICD-10-CM | POA: Insufficient documentation

## 2016-05-25 DIAGNOSIS — L97222 Non-pressure chronic ulcer of left calf with fat layer exposed: Secondary | ICD-10-CM | POA: Diagnosis not present

## 2016-05-25 DIAGNOSIS — N184 Chronic kidney disease, stage 4 (severe): Secondary | ICD-10-CM | POA: Diagnosis not present

## 2016-05-29 ENCOUNTER — Encounter (HOSPITAL_COMMUNITY): Payer: Medicare Other

## 2016-05-30 ENCOUNTER — Inpatient Hospital Stay (HOSPITAL_COMMUNITY): Admission: RE | Admit: 2016-05-30 | Payer: Medicare Other | Source: Ambulatory Visit

## 2016-06-01 DIAGNOSIS — I87333 Chronic venous hypertension (idiopathic) with ulcer and inflammation of bilateral lower extremity: Secondary | ICD-10-CM | POA: Diagnosis not present

## 2016-06-01 DIAGNOSIS — S81811A Laceration without foreign body, right lower leg, initial encounter: Secondary | ICD-10-CM | POA: Diagnosis not present

## 2016-06-01 DIAGNOSIS — L97212 Non-pressure chronic ulcer of right calf with fat layer exposed: Secondary | ICD-10-CM | POA: Diagnosis not present

## 2016-06-01 DIAGNOSIS — L97811 Non-pressure chronic ulcer of other part of right lower leg limited to breakdown of skin: Secondary | ICD-10-CM | POA: Diagnosis not present

## 2016-06-01 DIAGNOSIS — L97821 Non-pressure chronic ulcer of other part of left lower leg limited to breakdown of skin: Secondary | ICD-10-CM | POA: Diagnosis not present

## 2016-06-01 DIAGNOSIS — I87313 Chronic venous hypertension (idiopathic) with ulcer of bilateral lower extremity: Secondary | ICD-10-CM | POA: Diagnosis not present

## 2016-06-01 DIAGNOSIS — L97215 Non-pressure chronic ulcer of right calf with muscle involvement without evidence of necrosis: Secondary | ICD-10-CM | POA: Diagnosis not present

## 2016-06-01 DIAGNOSIS — L97229 Non-pressure chronic ulcer of left calf with unspecified severity: Secondary | ICD-10-CM | POA: Diagnosis not present

## 2016-06-01 DIAGNOSIS — L97222 Non-pressure chronic ulcer of left calf with fat layer exposed: Secondary | ICD-10-CM | POA: Diagnosis not present

## 2016-06-02 ENCOUNTER — Encounter (HOSPITAL_COMMUNITY): Payer: Medicare Other

## 2016-06-05 ENCOUNTER — Telehealth: Payer: Self-pay | Admitting: Emergency Medicine

## 2016-06-05 ENCOUNTER — Encounter (HOSPITAL_COMMUNITY)
Admission: RE | Admit: 2016-06-05 | Discharge: 2016-06-05 | Disposition: A | Discharge status: Home / Self Care | Payer: Medicare Other | Source: Ambulatory Visit | Attending: Nephrology | Admitting: Nephrology

## 2016-06-05 DIAGNOSIS — N183 Chronic kidney disease, stage 3 (moderate): Secondary | ICD-10-CM | POA: Diagnosis not present

## 2016-06-05 DIAGNOSIS — N184 Chronic kidney disease, stage 4 (severe): Secondary | ICD-10-CM

## 2016-06-05 DIAGNOSIS — J449 Chronic obstructive pulmonary disease, unspecified: Secondary | ICD-10-CM | POA: Diagnosis not present

## 2016-06-05 DIAGNOSIS — S81811D Laceration without foreign body, right lower leg, subsequent encounter: Secondary | ICD-10-CM | POA: Diagnosis not present

## 2016-06-05 DIAGNOSIS — L97213 Non-pressure chronic ulcer of right calf with necrosis of muscle: Secondary | ICD-10-CM | POA: Diagnosis not present

## 2016-06-05 DIAGNOSIS — L97221 Non-pressure chronic ulcer of left calf limited to breakdown of skin: Secondary | ICD-10-CM | POA: Diagnosis not present

## 2016-06-05 DIAGNOSIS — D631 Anemia in chronic kidney disease: Secondary | ICD-10-CM | POA: Insufficient documentation

## 2016-06-05 DIAGNOSIS — I509 Heart failure, unspecified: Secondary | ICD-10-CM | POA: Diagnosis not present

## 2016-06-05 DIAGNOSIS — S8012XD Contusion of left lower leg, subsequent encounter: Secondary | ICD-10-CM | POA: Diagnosis not present

## 2016-06-05 DIAGNOSIS — Z79899 Other long term (current) drug therapy: Secondary | ICD-10-CM | POA: Insufficient documentation

## 2016-06-05 DIAGNOSIS — Z5181 Encounter for therapeutic drug level monitoring: Secondary | ICD-10-CM | POA: Insufficient documentation

## 2016-06-05 DIAGNOSIS — I87333 Chronic venous hypertension (idiopathic) with ulcer and inflammation of bilateral lower extremity: Secondary | ICD-10-CM | POA: Diagnosis not present

## 2016-06-05 LAB — POCT HEMOGLOBIN-HEMACUE: HEMOGLOBIN: 9.2 g/dL — AB (ref 12.0–15.0)

## 2016-06-05 MED ORDER — EPOETIN ALFA 10000 UNIT/ML IJ SOLN
INTRAMUSCULAR | Status: AC
  Filled 2016-06-05: qty 1

## 2016-06-05 MED ORDER — EPOETIN ALFA 10000 UNIT/ML IJ SOLN
5000.0000 [IU] | INTRAMUSCULAR | Status: DC
  Administered 2016-06-05: 5000 [IU] via SUBCUTANEOUS

## 2016-06-05 NOTE — Telephone Encounter (Signed)
Home Health called pt has fallen 10 time in 7 days. She is not safe. She needs verbal order for PT. She has several skins tears on her legs and is being seen by the wound center. She continues to get then when she falls. She is concerned with her being on apixaban (ELIQUIS) 2.5 MG TABS tablet because if she falls and hits her head. She also wants to know if she can get a Education officer, museum evaluation? Please advise thanks.

## 2016-06-06 ENCOUNTER — Emergency Department (HOSPITAL_COMMUNITY): Payer: Medicare Other

## 2016-06-06 ENCOUNTER — Encounter (HOSPITAL_COMMUNITY): Payer: Self-pay | Admitting: Emergency Medicine

## 2016-06-06 ENCOUNTER — Inpatient Hospital Stay (HOSPITAL_COMMUNITY)
Admission: EM | Admit: 2016-06-06 | Discharge: 2016-06-12 | DRG: 291 | Disposition: A | Discharge status: Skilled Nursing Facility | Payer: Medicare Other | Attending: Internal Medicine | Admitting: Internal Medicine

## 2016-06-06 DIAGNOSIS — D631 Anemia in chronic kidney disease: Secondary | ICD-10-CM | POA: Diagnosis not present

## 2016-06-06 DIAGNOSIS — N184 Chronic kidney disease, stage 4 (severe): Secondary | ICD-10-CM | POA: Diagnosis present

## 2016-06-06 DIAGNOSIS — I5022 Chronic systolic (congestive) heart failure: Secondary | ICD-10-CM | POA: Diagnosis not present

## 2016-06-06 DIAGNOSIS — F419 Anxiety disorder, unspecified: Secondary | ICD-10-CM | POA: Diagnosis present

## 2016-06-06 DIAGNOSIS — I499 Cardiac arrhythmia, unspecified: Secondary | ICD-10-CM | POA: Diagnosis not present

## 2016-06-06 DIAGNOSIS — R627 Adult failure to thrive: Secondary | ICD-10-CM | POA: Diagnosis present

## 2016-06-06 DIAGNOSIS — M810 Age-related osteoporosis without current pathological fracture: Secondary | ICD-10-CM | POA: Diagnosis present

## 2016-06-06 DIAGNOSIS — N189 Chronic kidney disease, unspecified: Secondary | ICD-10-CM | POA: Diagnosis not present

## 2016-06-06 DIAGNOSIS — Z853 Personal history of malignant neoplasm of breast: Secondary | ICD-10-CM | POA: Diagnosis not present

## 2016-06-06 DIAGNOSIS — E875 Hyperkalemia: Secondary | ICD-10-CM | POA: Diagnosis not present

## 2016-06-06 DIAGNOSIS — R4182 Altered mental status, unspecified: Secondary | ICD-10-CM | POA: Diagnosis not present

## 2016-06-06 DIAGNOSIS — N179 Acute kidney failure, unspecified: Secondary | ICD-10-CM | POA: Diagnosis present

## 2016-06-06 DIAGNOSIS — Z515 Encounter for palliative care: Secondary | ICD-10-CM | POA: Diagnosis present

## 2016-06-06 DIAGNOSIS — R001 Bradycardia, unspecified: Secondary | ICD-10-CM | POA: Diagnosis not present

## 2016-06-06 DIAGNOSIS — M6281 Muscle weakness (generalized): Secondary | ICD-10-CM | POA: Diagnosis not present

## 2016-06-06 DIAGNOSIS — Z85828 Personal history of other malignant neoplasm of skin: Secondary | ICD-10-CM

## 2016-06-06 DIAGNOSIS — E785 Hyperlipidemia, unspecified: Secondary | ICD-10-CM | POA: Diagnosis present

## 2016-06-06 DIAGNOSIS — Z9981 Dependence on supplemental oxygen: Secondary | ICD-10-CM

## 2016-06-06 DIAGNOSIS — Z96653 Presence of artificial knee joint, bilateral: Secondary | ICD-10-CM | POA: Diagnosis present

## 2016-06-06 DIAGNOSIS — E0590 Thyrotoxicosis, unspecified without thyrotoxic crisis or storm: Secondary | ICD-10-CM | POA: Diagnosis present

## 2016-06-06 DIAGNOSIS — M1990 Unspecified osteoarthritis, unspecified site: Secondary | ICD-10-CM | POA: Diagnosis present

## 2016-06-06 DIAGNOSIS — Z87891 Personal history of nicotine dependence: Secondary | ICD-10-CM

## 2016-06-06 DIAGNOSIS — I130 Hypertensive heart and chronic kidney disease with heart failure and stage 1 through stage 4 chronic kidney disease, or unspecified chronic kidney disease: Principal | ICD-10-CM | POA: Diagnosis present

## 2016-06-06 DIAGNOSIS — Z79899 Other long term (current) drug therapy: Secondary | ICD-10-CM

## 2016-06-06 DIAGNOSIS — Z5181 Encounter for therapeutic drug level monitoring: Secondary | ICD-10-CM | POA: Diagnosis not present

## 2016-06-06 DIAGNOSIS — Z7901 Long term (current) use of anticoagulants: Secondary | ICD-10-CM | POA: Diagnosis not present

## 2016-06-06 DIAGNOSIS — Z66 Do not resuscitate: Secondary | ICD-10-CM | POA: Diagnosis present

## 2016-06-06 DIAGNOSIS — R0602 Shortness of breath: Secondary | ICD-10-CM

## 2016-06-06 DIAGNOSIS — I5023 Acute on chronic systolic (congestive) heart failure: Secondary | ICD-10-CM | POA: Diagnosis present

## 2016-06-06 DIAGNOSIS — I5021 Acute systolic (congestive) heart failure: Secondary | ICD-10-CM

## 2016-06-06 DIAGNOSIS — E440 Moderate protein-calorie malnutrition: Secondary | ICD-10-CM | POA: Diagnosis not present

## 2016-06-06 DIAGNOSIS — I129 Hypertensive chronic kidney disease with stage 1 through stage 4 chronic kidney disease, or unspecified chronic kidney disease: Secondary | ICD-10-CM | POA: Diagnosis not present

## 2016-06-06 DIAGNOSIS — J449 Chronic obstructive pulmonary disease, unspecified: Secondary | ICD-10-CM | POA: Diagnosis present

## 2016-06-06 DIAGNOSIS — R278 Other lack of coordination: Secondary | ICD-10-CM | POA: Diagnosis not present

## 2016-06-06 DIAGNOSIS — R404 Transient alteration of awareness: Secondary | ICD-10-CM | POA: Diagnosis not present

## 2016-06-06 DIAGNOSIS — J9811 Atelectasis: Secondary | ICD-10-CM | POA: Diagnosis not present

## 2016-06-06 DIAGNOSIS — E8770 Fluid overload, unspecified: Secondary | ICD-10-CM | POA: Diagnosis not present

## 2016-06-06 DIAGNOSIS — Z9221 Personal history of antineoplastic chemotherapy: Secondary | ICD-10-CM

## 2016-06-06 DIAGNOSIS — I4891 Unspecified atrial fibrillation: Secondary | ICD-10-CM | POA: Diagnosis present

## 2016-06-06 LAB — COMPREHENSIVE METABOLIC PANEL
ALK PHOS: 98 U/L (ref 38–126)
ALT: 75 U/L — AB (ref 14–54)
ANION GAP: 13 (ref 5–15)
AST: 76 U/L — ABNORMAL HIGH (ref 15–41)
Albumin: 3.2 g/dL — ABNORMAL LOW (ref 3.5–5.0)
BILIRUBIN TOTAL: 1.1 mg/dL (ref 0.3–1.2)
BUN: 85 mg/dL — ABNORMAL HIGH (ref 6–20)
CALCIUM: 9.1 mg/dL (ref 8.9–10.3)
CO2: 16 mmol/L — AB (ref 22–32)
CREATININE: 5.28 mg/dL — AB (ref 0.44–1.00)
Chloride: 107 mmol/L (ref 101–111)
GFR calc non Af Amer: 7 mL/min — ABNORMAL LOW (ref >60)
GFR, EST AFRICAN AMERICAN: 8 mL/min — AB (ref >60)
GLUCOSE: 105 mg/dL — AB (ref 65–99)
Potassium: >7.5 mmol/L (ref 3.5–5.1)
SODIUM: 136 mmol/L (ref 135–145)
TOTAL PROTEIN: 5.5 g/dL — AB (ref 6.5–8.1)

## 2016-06-06 LAB — I-STAT TROPONIN, ED: Troponin i, poc: 0.02 ng/mL (ref 0.00–0.08)

## 2016-06-06 LAB — CBC WITH DIFFERENTIAL/PLATELET
BASOS ABS: 0 10*3/uL (ref 0.0–0.1)
BASOS PCT: 0 %
EOS ABS: 0 10*3/uL (ref 0.0–0.7)
Eosinophils Relative: 0 %
HCT: 29.7 % — ABNORMAL LOW (ref 36.0–46.0)
Hemoglobin: 8.9 g/dL — ABNORMAL LOW (ref 12.0–15.0)
Lymphocytes Relative: 6 %
Lymphs Abs: 0.5 10*3/uL — ABNORMAL LOW (ref 0.7–4.0)
MCH: 29.5 pg (ref 26.0–34.0)
MCHC: 30 g/dL (ref 30.0–36.0)
MCV: 98.3 fL (ref 78.0–100.0)
MONO ABS: 0.8 10*3/uL (ref 0.1–1.0)
MONOS PCT: 9 %
NEUTROS ABS: 7.3 10*3/uL (ref 1.7–7.7)
NEUTROS PCT: 85 %
Platelets: 275 10*3/uL (ref 150–400)
RBC: 3.02 MIL/uL — ABNORMAL LOW (ref 3.87–5.11)
RDW: 18.8 % — AB (ref 11.5–15.5)
WBC: 8.6 10*3/uL (ref 4.0–10.5)

## 2016-06-06 LAB — I-STAT CG4 LACTIC ACID, ED: LACTIC ACID, VENOUS: 4.6 mmol/L — AB (ref 0.5–1.9)

## 2016-06-06 LAB — I-STAT CHEM 8, ED
BUN: 93 mg/dL — AB (ref 6–20)
CALCIUM ION: 1.1 mmol/L — AB (ref 1.15–1.40)
Chloride: 109 mmol/L (ref 101–111)
Creatinine, Ser: 5.2 mg/dL — ABNORMAL HIGH (ref 0.44–1.00)
Glucose, Bld: 102 mg/dL — ABNORMAL HIGH (ref 65–99)
HEMATOCRIT: 29 % — AB (ref 36.0–46.0)
HEMOGLOBIN: 9.9 g/dL — AB (ref 12.0–15.0)
Potassium: 8.2 mmol/L (ref 3.5–5.1)
SODIUM: 137 mmol/L (ref 135–145)
TCO2: 21 mmol/L (ref 0–100)

## 2016-06-06 LAB — CBG MONITORING, ED: Glucose-Capillary: 101 mg/dL — ABNORMAL HIGH (ref 65–99)

## 2016-06-06 MED ORDER — ALBUTEROL SULFATE (2.5 MG/3ML) 0.083% IN NEBU
INHALATION_SOLUTION | RESPIRATORY_TRACT | Status: AC
  Administered 2016-06-06: 5 mg via RESPIRATORY_TRACT
  Filled 2016-06-06: qty 3

## 2016-06-06 MED ORDER — ALPRAZOLAM 0.5 MG PO TABS
0.5000 mg | ORAL_TABLET | Freq: Two times a day (BID) | ORAL | Status: DC | PRN
  Administered 2016-06-06 – 2016-06-12 (×6): 0.5 mg via ORAL
  Filled 2016-06-06: qty 2
  Filled 2016-06-06 (×6): qty 1

## 2016-06-06 MED ORDER — BISOPROLOL FUMARATE 5 MG PO TABS: 2.5000 mg | ORAL_TABLET | Freq: Every day | ORAL | Status: DC

## 2016-06-06 MED ORDER — ALBUTEROL (5 MG/ML) CONTINUOUS INHALATION SOLN
10.0000 mg/h | INHALATION_SOLUTION | RESPIRATORY_TRACT | Status: DC
  Administered 2016-06-06: 10 mg/h via RESPIRATORY_TRACT
  Filled 2016-06-06: qty 20

## 2016-06-06 MED ORDER — AMIODARONE HCL 200 MG PO TABS
200.0000 mg | ORAL_TABLET | Freq: Two times a day (BID) | ORAL | Status: DC
  Filled 2016-06-06: qty 1

## 2016-06-06 MED ORDER — SODIUM BICARBONATE 8.4 % IV SOLN
50.0000 meq | Freq: Once | INTRAVENOUS | Status: AC
  Administered 2016-06-06: 50 meq via INTRAVENOUS

## 2016-06-06 MED ORDER — ALBUTEROL SULFATE (2.5 MG/3ML) 0.083% IN NEBU
5.0000 mg | INHALATION_SOLUTION | Freq: Once | RESPIRATORY_TRACT | Status: AC
  Administered 2016-06-06: 5 mg via RESPIRATORY_TRACT

## 2016-06-06 MED ORDER — INSULIN REGULAR BOLUS VIA INFUSION
10.0000 [IU] | Freq: Once | INTRAVENOUS | Status: DC
  Filled 2016-06-06: qty 10

## 2016-06-06 MED ORDER — MOMETASONE FURO-FORMOTEROL FUM 200-5 MCG/ACT IN AERO
2.0000 | INHALATION_SPRAY | Freq: Two times a day (BID) | RESPIRATORY_TRACT | Status: DC
Start: 2016-06-06 — End: 2016-06-12
  Administered 2016-06-07 – 2016-06-12 (×9): 2 via RESPIRATORY_TRACT
  Filled 2016-06-06 (×2): qty 8.8

## 2016-06-06 MED ORDER — INSULIN ASPART 100 UNIT/ML ~~LOC~~ SOLN
10.0000 [IU] | Freq: Once | SUBCUTANEOUS | Status: AC
  Administered 2016-06-06: 10 [IU] via INTRAVENOUS
  Filled 2016-06-06: qty 1

## 2016-06-06 MED ORDER — FUROSEMIDE 10 MG/ML IJ SOLN
80.0000 mg | Freq: Once | INTRAMUSCULAR | Status: AC
  Administered 2016-06-06: 80 mg via INTRAVENOUS
  Filled 2016-06-06: qty 8

## 2016-06-06 MED ORDER — CALCIUM CHLORIDE 10 % IV SOLN
INTRAVENOUS | Status: AC | PRN
  Administered 2016-06-06: 1 g via INTRAVENOUS

## 2016-06-06 MED ORDER — SODIUM CHLORIDE 0.9 % IV BOLUS (SEPSIS)
500.0000 mL | Freq: Once | INTRAVENOUS | Status: AC
  Administered 2016-06-06: 500 mL via INTRAVENOUS

## 2016-06-06 MED ORDER — SODIUM BICARBONATE 8.4 % IV SOLN
INTRAVENOUS | Status: AC | PRN
  Administered 2016-06-06: 100 meq via INTRAVENOUS

## 2016-06-06 MED ORDER — DEXTROSE 50 % IV SOLN
1.0000 | Freq: Once | INTRAVENOUS | Status: AC
  Administered 2016-06-06: 50 mL via INTRAVENOUS
  Filled 2016-06-06: qty 50

## 2016-06-06 MED ORDER — SODIUM BICARBONATE 8.4 % IV SOLN
INTRAVENOUS | Status: DC
  Administered 2016-06-06: 23:00:00 via INTRAVENOUS
  Filled 2016-06-06 (×2): qty 100

## 2016-06-06 MED ORDER — SODIUM CHLORIDE 0.9 % IV SOLN
1.0000 g | INTRAVENOUS | Status: AC
  Administered 2016-06-06: 1 g via INTRAVENOUS
  Filled 2016-06-06: qty 10

## 2016-06-06 MED ORDER — SODIUM CHLORIDE 0.9 % IV SOLN: 1.0000 g | Freq: Once | INTRAVENOUS | Status: DC

## 2016-06-06 MED ORDER — SODIUM CHLORIDE 0.9 % IV SOLN
INTRAVENOUS | Status: DC
  Filled 2016-06-06: qty 2.5

## 2016-06-06 MED ORDER — SODIUM CHLORIDE 0.9 % IV SOLN
INTRAVENOUS | Status: AC | PRN
  Administered 2016-06-06: 500 mL via INTRAVENOUS

## 2016-06-06 MED ORDER — CALCIUM GLUCONATE 10 % IV SOLN
1.0000 g | Freq: Once | INTRAVENOUS | Status: AC
  Administered 2016-06-06: 1 g via INTRAVENOUS

## 2016-06-06 MED ORDER — HEPARIN SODIUM (PORCINE) 5000 UNIT/ML IJ SOLN
5000.0000 [IU] | Freq: Three times a day (TID) | INTRAMUSCULAR | Status: DC
  Administered 2016-06-07 – 2016-06-12 (×16): 5000 [IU] via SUBCUTANEOUS
  Filled 2016-06-06 (×16): qty 1

## 2016-06-06 NOTE — Telephone Encounter (Signed)
Verbal orders given for PT and Social work eval. Will forward message to PCP for medication question/review.   Left detailed mess informing Vaughan Basta with [facility] that PCP is out of the office all week.

## 2016-06-06 NOTE — Consult Note (Signed)
PULMONARY / CRITICAL CARE MEDICINE   Name: Mia Contreras MRN: [id number] DOB: 02/28/1929    ADMISSION DATE:  06/06/2016 CONSULTATION DATE:  06/06/2016  REFERRING MD:  Dr. Dayna Barker EDP  CHIEF COMPLAINT:  SOB  HISTORY OF PRESENT ILLNESS:   82 year old female with PMH as below, which is significant for breast Ca, PAF on eliquis, COPD, HTN, HLD, renal insufficiency, and hyperthyroid. She is followed by cardiology and nephrology for her multiple medical problems. She has been told she is not a candidate for hyemodialysis. She has also be prescribed spironolactone and oral potassium supplementation. Recently she has been having some weakness and has been falling. 10/17 her neighbor found her in "bad shape" and called EMS. She was very bradycardic in ED and was found to be hyperkalemic at 8.2. This was temporized with calcium, albuterol, bicarb, and insulin/dextrose. Nephrology and PCCM asked to evaluate.  PAST MEDICAL HISTORY :  She  has a past medical history of AKI (acute kidney injury) ([place]) (01/2016); ANEMIA-NOS (08/20/2007); ANXIETY (03/16/2007); BREAST CANCER, HX OF (03/16/2007); CARPAL TUNNEL SYNDROME, RIGHT (01/11/2008); Cataract; CELLULITIS, LEG, RIGHT (08/20/2007); Collagenous colitis; COPD (chronic obstructive pulmonary disease) ([place]); DIVERTICULOSIS, COLON (08/20/2007); GOITER, MULTINODULAR (05/06/2010); HYPERLIPIDEMIA (08/20/2007); HYPERTENSION (03/16/2007); HYPERTHYROIDISM (05/23/2010); IBS (irritable bowel syndrome); OSTEOARTHRITIS (03/16/2007); OSTEOPOROSIS (08/20/2007); Pneumonia (07/2011); PREMATURE ATRIAL CONTRACTIONS (09/21/2010); SCOLIOSIS (01/11/2008); SKIN CANCER, HX OF (11/12/2008); Thyroid nodule; and VENOUS INSUFFICIENCY (09/05/2007).  PAST SURGICAL HISTORY: She  has a past surgical history that includes Total knee arthroplasty; Breast lumpectomy (Left, 2001); Abdominal hysterectomy; Appendectomy (1972); Mastectomy, partial (Left); Carpal tunnel release; Total knee arthroplasty (Right,  2010); Cataract extraction; Skin cancer excision; XRT; Eye surgery; Total knee arthroplasty (10/30/2011); and Hernia repair.  Allergies  Allergen Reactions  . Cefuroxime Diarrhea  . Celecoxib Nausea Only  . Deltasone [Prednisone]     Oral deltasone is causing swelling (can use Depo-Medrol)  . Ranitidine     bloating  . Tramadol Hcl Nausea Only    No current facility-administered medications on file prior to encounter.    Current Outpatient Prescriptions on File Prior to Encounter  Medication Sig  . ALPRAZolam (XANAX) 0.5 MG tablet TAKE 1 TABLET BY MOUTH TWICE A DAY AS NEEDED  . amiodarone (PACERONE) 200 MG tablet TAKE 1 TABLET (200 MG TOTAL) BY MOUTH 2 (TWO) TIMES DAILY.  Marland Kitchen apixaban (ELIQUIS) 2.5 MG TABS tablet Take 1 tablet (2.5 mg total) by mouth 2 (two) times daily.  . bisoprolol (ZEBETA) 5 MG tablet Take 0.5 tablets (2.5 mg total) by mouth daily.  . Calcium Carbonate-Vitamin D (CALCIUM 600+D) 600-400 MG-UNIT per tablet Take 1 tablet by mouth daily.  . Fluticasone-Salmeterol (ADVAIR DISKUS) 250-50 MCG/DOSE AEPB Inhale 1 puff into the lungs 2 (two) times daily.  . Multiple Vitamins-Minerals (MULTIVITAMIN PO) Take 1 tablet by mouth daily.   . mupirocin ointment (BACTROBAN) 2 % Use qd  . potassium chloride SA (K-DUR,KLOR-CON) 20 MEQ tablet Take 1 tablet (20 mEq total) by mouth 2 (two) times daily.  Marland Kitchen spironolactone (ALDACTONE) 25 MG tablet Take 1 tablet (25 mg total) by mouth daily.  Marland Kitchen torsemide (DEMADEX) 20 MG tablet Take 2 tablets (40 mg total) by mouth 2 (two) times daily.  . vitamin C (ASCORBIC ACID) 500 MG tablet Take 1 tablet (500 mg total) by mouth daily.    FAMILY HISTORY:  Her indicated that her mother is deceased. She indicated that her father is deceased. She indicated that only one of her two brothers is alive. She indicated that the status of  her neg hx is unknown. She indicated that the status of her other is unknown.    SOCIAL HISTORY: She  reports that she quit  smoking about 35 years ago. She has never used smokeless tobacco. She reports that she drinks alcohol. She reports that she does not use drugs.  REVIEW OF SYSTEMS:   Bolds are positive  Constitutional: weight loss, gain, night sweats, Fevers, chills, fatigue .  HEENT: headaches, Sore throat, sneezing, nasal congestion, post nasal drip, Difficulty swallowing, Tooth/dental problems, visual complaints visual changes, ear ache CV:  chest pain, radiates: ,Orthopnea, PND, swelling in lower extremities, dizziness, palpitations, syncope.  GI  heartburn, indigestion, abdominal pain, nausea, vomiting, diarrhea, change in bowel habits, loss of appetite, bloody stools.  Resp: cough, productive: , hemoptysis, dyspnea, chest pain, pleuritic.  Skin: rash or itching or icterus GU: dysuria, change in color of urine, urgency or frequency. flank pain, hematuria  MS: joint pain or swelling. decreased range of motion  Psych: change in mood or affect. depression or anxiety.  Neuro: difficulty with speech, weakness, numbness, ataxia   SUBJECTIVE:    VITAL SIGNS: BP 98/78 (BP Location: Right Arm)   Pulse (!) 42   Temp (!) 93.1 F (33.9 C) (Rectal)   Resp 19   Ht 5\' 4"  (1.626 m)   Wt 52.2 kg (115 lb)   SpO2 100%   BMI 19.74 kg/m   HEMODYNAMICS:    VENTILATOR SETTINGS:    INTAKE / OUTPUT: No intake/output data recorded.  PHYSICAL EXAMINATION: General:  Frail elderly female in NAD Neuro:  Alert, oriented, non-focal HEENT:  [place]/AT, PERRL, no JVD Cardiovascular:  Bradycardic 40s.  Lungs:  Clear Abdomen:  Soft, non-tender, non-distended Musculoskeletal:  Grossly intact Skin:  Grossly intact  LABS:  BMET  Recent Labs Lab 06/06/16 1926 06/06/16 1930  NA 137 136  K 8.2* >7.5*  CL 109 107  CO2  --  16*  BUN 93* 85*  CREATININE 5.20* 5.28*  GLUCOSE 102* 105*    Electrolytes  Recent Labs Lab 06/06/16 1930  CALCIUM 9.1    CBC  Recent Labs Lab 06/05/16 1449 06/06/16 1926  06/06/16 1930  WBC  --   --  8.6  HGB 9.2* 9.9* 8.9*  HCT  --  29.0* 29.7*  PLT  --   --  275    Coag's No results for input(s): APTT, INR in the last 168 hours.  Sepsis Markers  Recent Labs Lab 06/06/16 1926  LATICACIDVEN 4.60*    ABG No results for input(s): PHART, PCO2ART, PO2ART in the last 168 hours.  Liver Enzymes  Recent Labs Lab 06/06/16 1930  AST 76*  ALT 75*  ALKPHOS 98  BILITOT 1.1  ALBUMIN 3.2*    Cardiac Enzymes No results for input(s): TROPONINI, PROBNP in the last 168 hours.  Glucose  Recent Labs Lab 06/06/16 1931  GLUCAP 101*    Imaging Dg Chest Portable 1 View  Result Date: 06/06/2016 CLINICAL DATA:  Found on floor at home confused crawling around, history COPD, renal failure, hypertension EXAM: PORTABLE CHEST 1 VIEW COMPARISON:  Portable exam 1957 hours compared to 02/03/2016 FINDINGS: Enlargement of cardiac silhouette. Atherosclerotic calcification aorta. Mediastinal contours and pulmonary vascularity normal for slight degree of rotation to the RIGHT. Minimal atelectasis at RIGHT base. Atelectasis versus consolidation RIGHT lower lobe. External pacing leads project over chest. Upper lungs clear. No gross pleural effusion or pneumothorax. Bones demineralized with probable BILATERAL chronic rotator cuff tears. IMPRESSION: Enlargement of cardiac silhouette. Minimal RIGHT  basilar atelectasis with atelectasis versus consolidation LEFT lower lobe. Electronically Signed   By: Lavonia Dana M.D.   On: 06/06/2016 20:33     STUDIES:    CULTURES:   ANTIBIOTICS:   SIGNIFICANT EVENTS:   LINES/TUBES:   DISCUSSION:   ASSESSMENT / PLAN:  Acute Kidney Injury in setting of CKD Hyperkalemia secondary to renal disease, potassium supplementation, and potassium sparing diuretics - Long discussion with patient and healthcare POA Denyse Amass who is great great nephew and is a Geophysicist/field seismologist. He is aware of her critical illness and that she has been  deemed not a candidate for outpatient dialysis. Based on this information he agrees that this is something we should attempt to treat medically. She will likely respond well to this. Should she worsen she will be DNR. Bradycardia secondary to hyperkalemia  Recommendations: - Serial BMP - Hold spironolactone and KDur - Aggressive medical management with continue temporizing measures as needed - Kayexalate - HCO3 infusion - No HD - Nephrology following - DNR - Based on code status discussion she is appropriate for medical admission to telemetry unit under hospitalists.  PCCM will sign off.    Georgann Housekeeper, AGACNP-BC  Pulmonology/Critical Care Pager [phone] or [phone]  06/06/2016 9:43 PM  STAFF NOTE: Linwood Dibbles, MD FACP have personally reviewed patient's available data, including medical history, events of note, physical examination and test results as part of my evaluation. I have discussed with resident/NP and other care providers such as pharmacist, RN and RRT. In addition, I personally evaluated patient and elicited key findings of: awake, alert, oriented x 3 overall, knows that the president is MR D, coarse BS, JVD, brady, wide QRS is improved, she has cardiac dz and progressive renal failure and has NOT been a outpt candidate HD, presents with hyperkalemia and worsening renal failure, lactic Is NOT sepsis and is from hypoperfusion from bradycardia, she has a moderate NONAG acidosis, she is NOT a candidate for HD, appreciate renal input at bedside, and heroics would be futile, attempted to call med poa fank brother unable to get him, did d/w great nephew second med pa who also agrees that we should treat medical management but no heroics, pressors, shock acls etc, we will add bicarb drip, continued calcium consideration for redosing, albuteral, insulin and d50, and needs kayxylate, repeat bmet q4h, no code blue written, can be admitted to tele or floor, her BP is  wnl, no repeat lactic needed, no role abx, will sign off, comfort care if she declines The patient is critically ill with multiple organ systems failure and requires high complexity decision making for assessment and support, frequent evaluation and titration of therapies, application of advanced monitoring technologies and extensive interpretation of multiple databases.   Critical Care Time devoted to patient care services described in this note is 35 Minutes. This time reflects time of care of this signee: Merrie Roof, MD FACP. This critical care time does not reflect procedure time, or teaching time or supervisory time of PA/NP/Med student/Med Resident etc but could involve care discussion time. Rest per NP/medical resident whose note is outlined above and that I agree with   Lavon Paganini. Titus Mould, MD, [place] Pgr: [facility] 06/06/2016 10:17 PM

## 2016-06-06 NOTE — ED Notes (Signed)
Pt given 5mg  SAB per MD verbal order.

## 2016-06-06 NOTE — ED Notes (Signed)
[facility] at bedside

## 2016-06-06 NOTE — ED Triage Notes (Signed)
Pt in EMS, neighbor found pt crawling around on floor acting confused. Pt HR noted to be in high 20's, low 30's. Pt was hypotensive. Given 500 NS en route. Pt was also hypoxic, hx COPD and renal failure, does not do dialysis. Pt also takes beta blocker. Pt now A/OX4.

## 2016-06-06 NOTE — H&P (Signed)
History and Physical    Mia Contreras KLK:[id number] DOB: 21-Aug-1929 DOA: 06/06/2016   PCP: Walker Kehr, MD Chief Complaint:  Chief Complaint  Patient presents with  . Bradycardia    HPI: Mia Contreras is a 82 y.o. female with medical history significant of A.Fib, CKD stage 4, follows with Dr. Justin Mend but due to chronic systolic CHF with EF 97% is not felt to be a candidate for dialysis in future.  She presented to the ED this evening with severe generalized weakness onset over the past couple of days.  Symptoms severe, worsening, and nothing makes better or worse.  In ED: Found to have HR of 20 with very wide complex.  K > 8.  Improved with medical management measures.  Occurs in the context of also being on spironolactone and K supplementation at home chronically.  Nephrology and PCCM consulted.  Patient is DNR, not a candidate for dialysis, understands this, and wouldn't want dialysis (see their notes).  Medicine is asked to admit to try and manage medically.  Review of Systems: As per HPI otherwise 10 point review of systems negative.    Past Medical History:  Diagnosis Date  . AKI (acute kidney injury) ([place]) 01/2016  . ANEMIA-NOS 08/20/2007  . ANXIETY 03/16/2007  . BREAST CANCER, HX OF 03/16/2007  . CARPAL TUNNEL SYNDROME, RIGHT 01/11/2008  . Cataract    floaters  . CELLULITIS, LEG, RIGHT 08/20/2007  . Collagenous colitis   . COPD (chronic obstructive pulmonary disease) ([place])   . DIVERTICULOSIS, COLON 08/20/2007  . GOITER, MULTINODULAR 05/06/2010   Dr [name]  . HYPERLIPIDEMIA 08/20/2007  . HYPERTENSION 03/16/2007  . HYPERTHYROIDISM 05/23/2010  . IBS (irritable bowel syndrome)   . OSTEOARTHRITIS 03/16/2007  . OSTEOPOROSIS 08/20/2007  . Pneumonia 07/2011   took abx for several weeks  . PREMATURE ATRIAL CONTRACTIONS 09/21/2010  . SCOLIOSIS 01/11/2008  . SKIN CANCER, HX OF 11/12/2008    R cheek 2010, L Cheek 2011 Dr. Tonia Brooms  . Thyroid nodule   . VENOUS INSUFFICIENCY  09/05/2007    Past Surgical History:  Procedure Laterality Date  . ABDOMINAL HYSTERECTOMY    . APPENDECTOMY  1972  . BREAST LUMPECTOMY Left 2001  . CARPAL TUNNEL RELEASE    . CATARACT EXTRACTION    . EYE SURGERY     cataract ext  . HERNIA REPAIR    . MASTECTOMY, PARTIAL Left   . SKIN CANCER EXCISION     face-Dr. Bing Plume  . TOTAL KNEE ARTHROPLASTY    . TOTAL KNEE ARTHROPLASTY Right 2010   Dr Para March  . TOTAL KNEE ARTHROPLASTY  10/30/2011   Procedure: TOTAL KNEE ARTHROPLASTY;  Surgeon: Lorn Junes, MD;  Location: [facility];  Service: Orthopedics;  Laterality: Left;     . XRT     chemo, surgery     reports that she quit smoking about 35 years ago. She has never used smokeless tobacco. She reports that she drinks alcohol. She reports that she does not use drugs.  Allergies  Allergen Reactions  . Cefuroxime Diarrhea  . Celecoxib Nausea Only  . Deltasone [Prednisone]     Oral deltasone is causing swelling (can use Depo-Medrol)  . Ranitidine     bloating  . Tramadol Hcl Nausea Only    Family History  Problem Relation Age of Onset  . Dementia Mother   . Heart disease Mother   . Mental retardation Mother   . Hypertension Mother   . Alzheimer's disease Mother   .  Heart attack Father   . Alcohol abuse Father   . Prostate cancer Brother   . Diabetes Other   . Colon cancer Neg Hx   . Anesthesia problems Neg Hx       Prior to Admission medications   Medication Sig Start Date End Date Taking? Authorizing Provider  ALPRAZolam Duanne Moron) 0.5 MG tablet TAKE 1 TABLET BY MOUTH TWICE A DAY AS NEEDED 05/17/16  Yes Evie Lacks Plotnikov, MD  amiodarone (PACERONE) 200 MG tablet TAKE 1 TABLET (200 MG TOTAL) BY MOUTH 2 (TWO) TIMES DAILY. 03/13/16  Yes Josue Hector, MD  apixaban (ELIQUIS) 2.5 MG TABS tablet Take 1 tablet (2.5 mg total) by mouth 2 (two) times daily. 05/16/16  Yes Shaune Pascal Bensimhon, MD  bisoprolol (ZEBETA) 5 MG tablet Take 0.5 tablets (2.5 mg total) by mouth daily. 03/20/16  Yes  Cassandria Anger, MD  Calcium Carbonate-Vitamin D (CALCIUM 600+D) 600-400 MG-UNIT per tablet Take 1 tablet by mouth daily.   Yes Historical Provider, MD  Fluticasone-Salmeterol (ADVAIR DISKUS) 250-50 MCG/DOSE AEPB Inhale 1 puff into the lungs 2 (two) times daily. 05/12/15  Yes Cassandria Anger, MD  Multiple Vitamins-Minerals (MULTIVITAMIN PO) Take 1 tablet by mouth daily.    Yes Historical Provider, MD  mupirocin ointment (BACTROBAN) 2 % Use qd Patient taking differently: Apply 1 application topically daily.  04/26/16  Yes Evie Lacks Plotnikov, MD  torsemide (DEMADEX) 20 MG tablet Take 2 tablets (40 mg total) by mouth 2 (two) times daily. 02/08/16  Yes Amy D Clegg, NP  vitamin C (ASCORBIC ACID) 500 MG tablet Take 1 tablet (500 mg total) by mouth daily. 03/14/16  Yes Cassandria Anger, MD    Physical Exam: Vitals:   06/06/16 2053 06/06/16 2100 06/06/16 2130 06/06/16 2245  BP:  140/72 173/69 (!) 134/111  Pulse:  (!) 40 (!) 51 70  Resp:  15 17   Temp:      TempSrc:      SpO2: 100% 100% 100% 100%  Weight:      Height:          Constitutional: NAD, calm, comfortable Eyes: PERRL, lids and conjunctivae normal ENMT: Mucous membranes are moist. Posterior pharynx clear of any exudate or lesions.Normal dentition.  Neck: normal, supple, no masses, no thyromegaly Respiratory: clear to auscultation bilaterally, no wheezing, no crackles. Normal respiratory effort. No accessory muscle use.  Cardiovascular: Regular rate and rhythm, no murmurs / rubs / gallops. No extremity edema. 2+ pedal pulses. No carotid bruits.  Abdomen: no tenderness, no masses palpated. No hepatosplenomegaly. Bowel sounds positive.  Musculoskeletal: no clubbing / cyanosis. No joint deformity upper and lower extremities. Good ROM, no contractures. Normal muscle tone.  Skin: no rashes, lesions, ulcers. No induration Neurologic: CN 2-12 grossly intact. Sensation intact, DTR normal. Strength 5/5 in all 4.  Psychiatric: Normal  judgment and insight. Alert and oriented x 3. Normal mood.    Labs on Admission: I have personally reviewed following labs and imaging studies  CBC:  Recent Labs Lab 06/05/16 1449 06/06/16 1926 06/06/16 1930  WBC  --   --  8.6  NEUTROABS  --   --  7.3  HGB 9.2* 9.9* 8.9*  HCT  --  29.0* 29.7*  MCV  --   --  98.3  PLT  --   --  607   Basic Metabolic Panel:  Recent Labs Lab 06/06/16 1926 06/06/16 1930  NA 137 136  K 8.2* >7.5*  CL 109 107  CO2  --  16*  GLUCOSE 102* 105*  BUN 93* 85*  CREATININE 5.20* 5.28*  CALCIUM  --  9.1   GFR: Estimated Creatinine Clearance: 6.5 mL/min (by C-G formula based on SCr of 5.28 mg/dL (H)). Liver Function Tests:  Recent Labs Lab 06/06/16 1930  AST 76*  ALT 75*  ALKPHOS 98  BILITOT 1.1  PROT 5.5*  ALBUMIN 3.2*   No results for input(s): LIPASE, AMYLASE in the last 168 hours. No results for input(s): AMMONIA in the last 168 hours. Coagulation Profile: No results for input(s): INR, PROTIME in the last 168 hours. Cardiac Enzymes: No results for input(s): CKTOTAL, CKMB, CKMBINDEX, TROPONINI in the last 168 hours. BNP (last 3 results)  Recent Labs  12/01/15 1052  PROBNP 1,090.0*   HbA1C: No results for input(s): HGBA1C in the last 72 hours. CBG:  Recent Labs Lab 06/06/16 1931  GLUCAP 101*   Lipid Profile: No results for input(s): CHOL, HDL, LDLCALC, TRIG, CHOLHDL, LDLDIRECT in the last 72 hours. Thyroid Function Tests: No results for input(s): TSH, T4TOTAL, FREET4, T3FREE, THYROIDAB in the last 72 hours. Anemia Panel: No results for input(s): VITAMINB12, FOLATE, FERRITIN, TIBC, IRON, RETICCTPCT in the last 72 hours. Urine analysis:    Component Value Date/Time   COLORURINE YELLOW 01/29/2016 [place] 01/29/2016 1615   LABSPEC 1.011 01/29/2016 1615   PHURINE 7.5 01/29/2016 1615   GLUCOSEU NEGATIVE 01/29/2016 1615   GLUCOSEU NEGATIVE 05/06/2015 1008   HGBUR NEGATIVE 01/29/2016 1615    BILIRUBINUR NEGATIVE 01/29/2016 1615   KETONESUR NEGATIVE 01/29/2016 1615   PROTEINUR NEGATIVE 01/29/2016 1615   UROBILINOGEN 0.2 05/06/2015 1008   NITRITE NEGATIVE 01/29/2016 1615   LEUKOCYTESUR NEGATIVE 01/29/2016 1615   Sepsis Labs: @LABRCNTIP (procalcitonin:4,lacticidven:4) )No results found for this or any previous visit (from the past 240 hour(s)).   Radiological Exams on Admission: Dg Chest Portable 1 View  Result Date: 06/06/2016 CLINICAL DATA:  Found on floor at home confused crawling around, history COPD, renal failure, hypertension EXAM: PORTABLE CHEST 1 VIEW COMPARISON:  Portable exam 1957 hours compared to 02/03/2016 FINDINGS: Enlargement of cardiac silhouette. Atherosclerotic calcification aorta. Mediastinal contours and pulmonary vascularity normal for slight degree of rotation to the RIGHT. Minimal atelectasis at RIGHT base. Atelectasis versus consolidation RIGHT lower lobe. External pacing leads project over chest. Upper lungs clear. No gross pleural effusion or pneumothorax. Bones demineralized with probable BILATERAL chronic rotator cuff tears. IMPRESSION: Enlargement of cardiac silhouette. Minimal RIGHT basilar atelectasis with atelectasis versus consolidation LEFT lower lobe. Electronically Signed   By: Lavonia Dana M.D.   On: 06/06/2016 20:33    EKG: Independently reviewed.  Assessment/Plan Principal Problem:   Hyperkalemia Active Problems:   Chronic systolic heart failure (HCC)   Acute kidney injury superimposed on chronic kidney disease (HCC)   CKD (chronic kidney disease) stage 4, GFR 15-29 ml/min (HCC)    1. Hyperkalemia in setting of AKI on CKD stage 4 - critical hyperkalemia that nearly caused cardiac arrest (HR of 20 with near sine wave appearance of initial EKG), now EKG improved significantly. 1. Stop K, stop spironolactone 2. Does have UOP in ED and appears volume overloaded with edema up to her waist.  Spoke with Dr. Clover Mealy over phone again and we  will put patient on: 1. Bicarb gtt at 50 cc/hr 2. Lasix 80mg  IV once given that her BP has improved since initially evaluated. 3. Recheck lactic acid 4. Repeat BMP in AM 5. Tele monitor 6. Strict intake and output 2. Chronic systolic CHF  with EF 30% 3. HTN - continue home meds as able, may need to hold depending on what BP does in response to lasix   DVT prophylaxis: Heparin  Code Status: DNR, and do not dialyze Family Communication: Spoke at length with brother on phone regarding the seriousness of her condition which is likely fatal if medical treatment fails Consults called: PCCM and nephrology have seen patient, see their notes Admission status: Admit to inpatient   Etta Quill DO [facility] Pager [phone] from 7PM-7AM  If 7AM-7PM, please contact the day physician for the patient [email or web address] Password [email or web address]  06/06/2016, 11:11 PM

## 2016-06-06 NOTE — ED Notes (Signed)
Nephrology at bedside

## 2016-06-06 NOTE — Consult Note (Signed)
[facility] Renal Consultation Note  Requesting MD:  Indication for Consultation:   HPI:  Mia Contreras is a 82 y.o. female with PMhx obtained from the chart of Af[facility] it appears from her med list on amiodarone and bisoprolol as well as  Eliquis, EF 30%,  HTN as well as some renal insufficiency although creatinine was 1.26 in June- up to 2.58 in July- has seen Dr. Justin Mend in our office has been determined to not be a candidate for dialysis- creatinine has been in the 3's,  pt aware and agrees "my heart is too weak" .  She also has hyperklipidemia and was on aldactone as well as potassium supplements at home- 40 q day.  Apparently pt was confused, falling so neighbor called 911.  Pt is noted to be chronically ill at baseline- in talking to the neighbor- her healthcare POA is her brother Pilar Plate who apparently is ill as well [phone] but there was talk of transfering the power to Denyse Amass, his grandson [phone].  I spoke to a grand niece who is close to the patient who thinks that Ms. Provencio has advanced Directives that have been filed with Dr. Alain Marion "she would not want any of that"  The pt does tell me that she would not want dialysis or a feeding tube BUT says for vent/shocks/CPR "If you think I am going to live then yes, if not no" .  Here in ER- she was bradycardic with wide complex it improved with calcium and fluids somewhat- her blood pressure is 90-130.  First K was 8.2, repeat greater than 7.5 - creatinine 5.2  Creat  Date/Time Value Ref Range Status  11/29/2015 03:22 PM 2.14 (H) 0.60 - 0.88 mg/dL Final   Creatinine, Ser  Date/Time Value Ref Range Status  06/06/2016 07:26 PM 5.20 (H) 0.44 - 1.00 mg/dL Final  02/24/2016 12:15 PM 2.58 (H) 0.44 - 1.00 mg/dL Final  02/07/2016 04:04 AM 1.26 (H) 0.44 - 1.00 mg/dL Final  02/06/2016 02:39 AM 1.34 (H) 0.44 - 1.00 mg/dL Final  02/05/2016 03:40 AM 1.44 (H) 0.44 - 1.00 mg/dL Final  02/04/2016 05:19 AM 1.62 (H) 0.44 - 1.00 mg/dL  Final  02/03/2016 04:17 AM 1.67 (H) 0.44 - 1.00 mg/dL Final  02/02/2016 04:22 AM 1.96 (H) 0.44 - 1.00 mg/dL Final  02/01/2016 03:03 AM 2.66 (H) 0.44 - 1.00 mg/dL Final  01/31/2016 02:40 AM 2.93 (H) 0.44 - 1.00 mg/dL Final    Comment:    DELTA CHECK NOTED  01/29/2016 03:47 AM 1.50 (H) 0.44 - 1.00 mg/dL Final  01/28/2016 03:49 AM 1.63 (H) 0.44 - 1.00 mg/dL Final  01/27/2016 04:30 AM 1.85 (H) 0.44 - 1.00 mg/dL Final  01/26/2016 01:57 PM 2.08 (H) 0.44 - 1.00 mg/dL Final  01/07/2016 09:45 AM 2.93 (H) 0.44 - 1.00 mg/dL Final  01/05/2016 12:45 PM 2.92 (H) 0.44 - 1.00 mg/dL Final  12/31/2015 10:51 AM 2.49 (H) 0.44 - 1.00 mg/dL Final  12/28/2015 11:10 AM 2.26 (H) 0.44 - 1.00 mg/dL Final  12/19/2015 01:58 AM 1.67 (H) 0.44 - 1.00 mg/dL Final  12/18/2015 05:09 AM 1.60 (H) 0.44 - 1.00 mg/dL Final  12/17/2015 04:16 AM 1.63 (H) 0.44 - 1.00 mg/dL Final  12/16/2015 02:46 AM 1.81 (H) 0.44 - 1.00 mg/dL Final  12/15/2015 03:15 AM 1.99 (H) 0.44 - 1.00 mg/dL Final  12/14/2015 01:37 PM 2.24 (H) 0.44 - 1.00 mg/dL Final  12/01/2015 10:52 AM 2.47 (H) 0.40 - 1.20 mg/dL Final  11/04/2015 10:16 AM 1.34 (H)  0.40 - 1.20 mg/dL Final  05/06/2015 10:08 AM 1.11 0.40 - 1.20 mg/dL Final  11/04/2014 10:00 AM 1.08 0.40 - 1.20 mg/dL Final  07/31/2014 09:53 AM 1.3 (H) 0.4 - 1.2 mg/dL Final  02/06/2014 03:20 PM 1.2 0.4 - 1.2 mg/dL Final  10/31/2013 11:34 AM 1.0 0.4 - 1.2 mg/dL Final  10/17/2013 11:41 AM 1.0 0.4 - 1.2 mg/dL Final  10/08/2013 11:37 AM 1.1 0.4 - 1.2 mg/dL Final  09/26/2013 12:08 PM 1.0 0.4 - 1.2 mg/dL Final  08/22/2013 12:11 PM 1.0 0.4 - 1.2 mg/dL Final  07/24/2013 12:01 PM 1.0 0.4 - 1.2 mg/dL Final  03/05/2013 10:34 AM 1.1 0.4 - 1.2 mg/dL Final  03/05/2013 10:34 AM 1.1 0.4 - 1.2 mg/dL Final  01/21/2013 10:55 AM 0.9 0.4 - 1.2 mg/dL Final  11/05/2012 10:01 AM 1.00 0.50 - 1.10 mg/dL Final  08/07/2012 01:53 PM 1.0 0.4 - 1.2 mg/dL Final  04/10/2012 01:44 PM 0.9 0.4 - 1.2 mg/dL Final  11/04/202013 02:04 PM 0.9  0.4 - 1.2 mg/dL Final  11/02/2011 05:38 AM 1.20 (H) 0.50 - 1.10 mg/dL Final  11/01/2011 06:55 AM 1.18 (H) 0.50 - 1.10 mg/dL Final  10/31/2011 05:55 AM 0.86 0.50 - 1.10 mg/dL Final  10/25/2011 12:00 PM 0.78 0.50 - 1.10 mg/dL Final  10/18/2011 12:01 PM 0.7 0.4 - 1.2 mg/dL Final  08/02/2011 02:26 PM 0.9 0.4 - 1.2 mg/dL Final  03/23/2011 10:00 AM 1.0 0.4 - 1.2 mg/dL Final  09/14/2010 09:25 AM 1.1 0.4 - 1.2 mg/dL Final  03/09/2010 09:49 AM 1.0 0.4 - 1.2 mg/dL Final     PMHx:   Past Medical History:  Diagnosis Date  . AKI (acute kidney injury) ([place]) 01/2016  . ANEMIA-NOS 08/20/2007  . ANXIETY 03/16/2007  . BREAST CANCER, HX OF 03/16/2007  . CARPAL TUNNEL SYNDROME, RIGHT 01/11/2008  . Cataract    floaters  . CELLULITIS, LEG, RIGHT 08/20/2007  . Collagenous colitis   . COPD (chronic obstructive pulmonary disease) ([place])   . DIVERTICULOSIS, COLON 08/20/2007  . GOITER, MULTINODULAR 05/06/2010   Dr [name]  . HYPERLIPIDEMIA 08/20/2007  . HYPERTENSION 03/16/2007  . HYPERTHYROIDISM 05/23/2010  . IBS (irritable bowel syndrome)   . OSTEOARTHRITIS 03/16/2007  . OSTEOPOROSIS 08/20/2007  . Pneumonia 07/2011   took abx for several weeks  . PREMATURE ATRIAL CONTRACTIONS 09/21/2010  . SCOLIOSIS 01/11/2008  . SKIN CANCER, HX OF 11/12/2008    R cheek 2010, L Cheek 2011 Dr. Tonia Brooms  . Thyroid nodule   . VENOUS INSUFFICIENCY 09/05/2007    Past Surgical History:  Procedure Laterality Date  . ABDOMINAL HYSTERECTOMY    . APPENDECTOMY  1972  . BREAST LUMPECTOMY Left 2001  . CARPAL TUNNEL RELEASE    . CATARACT EXTRACTION    . EYE SURGERY     cataract ext  . HERNIA REPAIR    . MASTECTOMY, PARTIAL Left   . SKIN CANCER EXCISION     face-Dr. Bing Plume  . TOTAL KNEE ARTHROPLASTY    . TOTAL KNEE ARTHROPLASTY Right 2010   Dr Para March  . TOTAL KNEE ARTHROPLASTY  10/30/2011   Procedure: TOTAL KNEE ARTHROPLASTY;  Surgeon: Lorn Junes, MD;  Location: [facility];  Service: Orthopedics;  Laterality: Left;     . XRT      chemo, surgery    Family Hx:  Family History  Problem Relation Age of Onset  . Dementia Mother   . Heart disease Mother   . Mental retardation Mother   . Hypertension Mother   .  Alzheimer's disease Mother   . Heart attack Father   . Alcohol abuse Father   . Prostate cancer Brother   . Diabetes Other   . Colon cancer Neg Hx   . Anesthesia problems Neg Hx     Social History:  reports that she quit smoking about 35 years ago. She has never used smokeless tobacco. She reports that she drinks alcohol. She reports that she does not use drugs.  Allergies:  Allergies  Allergen Reactions  . Cefuroxime Diarrhea  . Celecoxib Nausea Only  . Deltasone [Prednisone]     Oral deltasone is causing swelling (can use Depo-Medrol)  . Ranitidine     bloating  . Tramadol Hcl Nausea Only    Medications: Prior to Admission medications   Medication Sig Start Date End Date Taking? Authorizing Provider  ALPRAZolam Duanne Moron) 0.5 MG tablet TAKE 1 TABLET BY MOUTH TWICE A DAY AS NEEDED 05/17/16   Cassandria Anger, MD  amiodarone (PACERONE) 200 MG tablet TAKE 1 TABLET (200 MG TOTAL) BY MOUTH 2 (TWO) TIMES DAILY. 03/13/16   Josue Hector, MD  apixaban (ELIQUIS) 2.5 MG TABS tablet Take 1 tablet (2.5 mg total) by mouth 2 (two) times daily. 05/16/16   Jolaine Artist, MD  bisoprolol (ZEBETA) 5 MG tablet Take 0.5 tablets (2.5 mg total) by mouth daily. 03/20/16   Cassandria Anger, MD  Calcium Carbonate-Vitamin D (CALCIUM 600+D) 600-400 MG-UNIT per tablet Take 1 tablet by mouth daily.    Historical Provider, MD  Fluticasone-Salmeterol (ADVAIR DISKUS) 250-50 MCG/DOSE AEPB Inhale 1 puff into the lungs 2 (two) times daily. 05/12/15   Cassandria Anger, MD  Multiple Vitamins-Minerals (MULTIVITAMIN PO) Take 1 tablet by mouth daily.     Historical Provider, MD  mupirocin ointment (BACTROBAN) 2 % Use qd 04/26/16   Cassandria Anger, MD  potassium chloride SA (K-DUR,KLOR-CON) 20 MEQ tablet Take 1 tablet (20  mEq total) by mouth 2 (two) times daily. 04/04/16   Cassandria Anger, MD  spironolactone (ALDACTONE) 25 MG tablet Take 1 tablet (25 mg total) by mouth daily. 03/20/16   Evie Lacks Plotnikov, MD  torsemide (DEMADEX) 20 MG tablet Take 2 tablets (40 mg total) by mouth 2 (two) times daily. 02/08/16   Amy D Ninfa Meeker, NP  vitamin C (ASCORBIC ACID) 500 MG tablet Take 1 tablet (500 mg total) by mouth daily. 03/14/16   Cassandria Anger, MD    I have reviewed the patient's current medications.  Labs:  Results for orders placed or performed during the hospital encounter of 06/06/16 (from the past 48 hour(s))  I-stat troponin, ED     Status: None   Collection Time: 06/06/16  7:23 PM  Result Value Ref Range   Troponin i, poc 0.02 0.00 - 0.08 ng/mL   Comment 3            Comment: Due to the release kinetics of cTnI, a negative result within the first hours of the onset of symptoms does not rule out myocardial infarction with certainty. If myocardial infarction is still suspected, repeat the test at appropriate intervals.   I-Stat CG4 Lactic Acid, ED     Status: Abnormal   Collection Time: 06/06/16  7:26 PM  Result Value Ref Range   Lactic Acid, Venous 4.60 (HH) 0.5 - 1.9 mmol/L   Comment NOTIFIED PHYSICIAN   I-stat chem 8, ed     Status: Abnormal   Collection Time: 06/06/16  7:26 PM  Result Value Ref Range  Sodium 137 135 - 145 mmol/L   Potassium 8.2 (HH) 3.5 - 5.1 mmol/L   Chloride 109 101 - 111 mmol/L   BUN 93 (H) 6 - 20 mg/dL   Creatinine, Ser 5.20 (H) 0.44 - 1.00 mg/dL   Glucose, Bld 102 (H) 65 - 99 mg/dL   Calcium, Ion 1.10 (L) 1.15 - 1.40 mmol/L   TCO2 21 0 - 100 mmol/L   Hemoglobin 9.9 (L) 12.0 - 15.0 g/dL   HCT 29.0 (L) 36.0 - 46.0 %   Comment NOTIFIED PHYSICIAN   CBC with Differential     Status: Abnormal   Collection Time: 06/06/16  7:30 PM  Result Value Ref Range   WBC 8.6 4.0 - 10.5 K/uL   RBC 3.02 (L) 3.87 - 5.11 MIL/uL   Hemoglobin 8.9 (L) 12.0 - 15.0 g/dL   HCT 29.7 (L)  36.0 - 46.0 %   MCV 98.3 78.0 - 100.0 fL   MCH 29.5 26.0 - 34.0 pg   MCHC 30.0 30.0 - 36.0 g/dL   RDW 18.8 (H) 11.5 - 15.5 %   Platelets 275 150 - 400 K/uL   Neutrophils Relative % 85 %   Neutro Abs 7.3 1.7 - 7.7 K/uL   Lymphocytes Relative 6 %   Lymphs Abs 0.5 (L) 0.7 - 4.0 K/uL   Monocytes Relative 9 %   Monocytes Absolute 0.8 0.1 - 1.0 K/uL   Eosinophils Relative 0 %   Eosinophils Absolute 0.0 0.0 - 0.7 K/uL   Basophils Relative 0 %   Basophils Absolute 0.0 0.0 - 0.1 K/uL  CBG monitoring, ED     Status: Abnormal   Collection Time: 06/06/16  7:31 PM  Result Value Ref Range   Glucose-Capillary 101 (H) 65 - 99 mg/dL   Comment 1 Notify RN    Comment 2 Document in Chart      ROS:  Review of systems not obtained due to patient factors. Pt somewhat confused- denies pain- has significant LE edema and legs are wrapped  Physical Exam: Vitals:   06/06/16 1922 06/06/16 1938  BP: 110/74 124/74  Pulse: (!) 40 63  Resp: 17 17  Temp: (!) 93.1 F (33.9 C)      General: elderly , pleasant, somewhat confused HEENT: PERRLA, EOMI Neck: no JVD Heart: brady Lungs: poor effort, mostly clear Abdomen: soft, non tender Extremities: pitting edema throughout legs- has UNA boots, foul smelling odor Skin: warm and dry Neuro: confused at times but recalls names of MDs and family members  Assessment/Plan: 82 year old WF with CHF- EF 30%, progressive CKD felt not to be a candidate for dialysis presenting with altered MS- AKI on CKD as well as hyperkalemia causing bradycardia 1.Renal- known CKD that is fairly advanced.  Seen by Dr. Justin Mend in the office- determined to not be a candidate for dialysis and pt agrees. 2; Hyperkalemia- Dont know if renal failure preceded the hyperkalemia or vice versa. Somewhat iatragenic with potassium supps and aldactone- will be held but also with the amio and beta blocker has caused bradycardia and hypotension.  So far has responded somewhat to medical therapy. I would  continue with medical therapy without dialysis (pt agrees) but also need a plan regarding more aggressive measures for patient.  At least one family member is of the opinion that "she would not want all of that" I have passed this information onto CCM 3. Hypertension/volume  - massively volume overloaded but BP is soft- diuresis will be difficult 4. Anemia  -  had been getting procrit as OP due to CKD- possible GIB as well given A on CRF   Renal will continue to follow pt for now- Dr. Justin Mend will be working tomorrow    Evelisse Szalkowski A 06/06/2016, 8:21 PM

## 2016-06-06 NOTE — ED Notes (Signed)
Bear hugger applied 

## 2016-06-06 NOTE — ED Notes (Signed)
Pt's CBG result was 101. Informed Tori - RN.

## 2016-06-06 NOTE — ED Provider Notes (Signed)
[facility] DEPT Provider Note   CSN: [id number] Arrival date & time: 06/06/16  1918     History   Chief Complaint Chief Complaint  Patient presents with  . Bradycardia    HPI Mia Contreras is a 82 y.o. female.  Mia Contreras presents to ED via EMS for evaluation of confusion, AMS, found down by neighbor crawling on floor when he went to check up on her. Pt states she fell yesterday or today and has been crawling, no traumatic injuries, did not hit head. Pt has been feeling generalized weakness/fatigue, dyspnea, no chest pain or discomfort. Found to be bradycardic to 20's by EMS with SBP 92 on there initial assessment. As pt has been mentating relatively well, no medications and no pacing en route by Ems. Denies fevers, denies cough, denies headaches.    The history is provided by the patient and the EMS personnel. The history is limited by the condition of the patient (critical condition).    Past Medical History:  Diagnosis Date  . AKI (acute kidney injury) ([place]) 01/2016  . ANEMIA-NOS 08/20/2007  . ANXIETY 03/16/2007  . BREAST CANCER, HX OF 03/16/2007  . CARPAL TUNNEL SYNDROME, RIGHT 01/11/2008  . Cataract    floaters  . CELLULITIS, LEG, RIGHT 08/20/2007  . Collagenous colitis   . COPD (chronic obstructive pulmonary disease) ([place])   . DIVERTICULOSIS, COLON 08/20/2007  . GOITER, MULTINODULAR 05/06/2010   Dr [name]  . HYPERLIPIDEMIA 08/20/2007  . HYPERTENSION 03/16/2007  . HYPERTHYROIDISM 05/23/2010  . IBS (irritable bowel syndrome)   . OSTEOARTHRITIS 03/16/2007  . OSTEOPOROSIS 08/20/2007  . Pneumonia 07/2011   took abx for several weeks  . PREMATURE ATRIAL CONTRACTIONS 09/21/2010  . SCOLIOSIS 01/11/2008  . SKIN CANCER, HX OF 11/12/2008    R cheek 2010, L Cheek 2011 Dr. Tonia Brooms  . Thyroid nodule   . VENOUS INSUFFICIENCY 09/05/2007    Patient Active Problem List   Diagnosis Date Noted  . Hyperkalemia 06/06/2016  . CKD (chronic kidney disease) stage 4, GFR 15-29  ml/min (HCC) 06/06/2016  . Skin tear of left forearm without complication 61/60/7371  . HCAP (healthcare-associated pneumonia) 01/30/2016  . Chronic anticoagulation-Eliquis 01/30/2016  . Hypotension-currently asymptomatic 01/30/2016  . Malnutrition of moderate degree 01/27/2016  . Falls 01/26/2016  . Hypoxia 01/26/2016  . Acute on chronic diastolic congestive heart failure, NYHA class 4 ([place]) 01/26/2016  . Atrial fibrillation ([place]) 01/11/2016  . PAF- currently in AF on Amiodarone 200 mg BID   . Acute CHF ([place]) 12/15/2015  . AKI (acute kidney injury) ([place]) 12/14/2015  . CHF (congestive heart failure) ([place]) 12/14/2015  . Acute on chronic combined systolic and diastolic heart failure ([place]) 12/14/2015  . Acute kidney injury superimposed on chronic kidney disease ([place]) 12/14/2015  . CRF (chronic renal failure) 12/14/2015  . IBS (irritable bowel syndrome) 12/14/2015  . Open leg wound 11/08/2015  . Hiatal hernia 05/12/2015  . Subconjunctival hemorrhage 08/07/2014  . Chronic renal failure 08/07/2014  . Nausea without vomiting 07/15/2014  . Noninfected skin tear of right leg 07/03/2014  . Hypoglycemia 12/12/2013  . Chronic systolic heart failure ([place]) 08/22/2013  . NICM (nonischemic cardiomyopathy) ([place]) 08/22/2013  . Diarrhea 10/01/2012  . Dyspnea on exertion 06/27/2012  . Asthmatic bronchitis 06/27/2012  . UTI (urinary tract infection) 11/02/2011  . Postoperative anemia due to acute blood loss 11/01/2011  . COPD (chronic obstructive pulmonary disease) ([place]) 08/16/2011  . Cough 08/08/2011  . Preop exam for internal medicine 08/02/2011  . Cerumen impaction  08/02/2011  . Bruit 04/19/2011  . Arrhythmia 03/29/2011  . Neoplasm of uncertain behavior of skin 09/21/2010  . PREMATURE ATRIAL CONTRACTIONS 09/21/2010  . CYSTITIS 09/21/2010  . Thyrotoxicosis 05/23/2010  . GOITER, MULTINODULAR 05/06/2010  . TOBACCO USE, QUIT 06/14/2009  . ELECTROCARDIOGRAM, ABNORMAL 04/02/2009  . SKIN CANCER,  HX OF 11/12/2008  . CARPAL TUNNEL SYNDROME, RIGHT 01/11/2008  . SCOLIOSIS 01/11/2008  . VENOUS INSUFFICIENCY 09/05/2007  . Edema 09/05/2007  . ANEMIA-NOS 08/20/2007  . DIVERTICULOSIS, COLON 08/20/2007  . CELLULITIS, LEG, RIGHT 08/20/2007  . OSTEOPOROSIS 08/20/2007  . Open wound of knee, leg, and ankle 07/03/2007  . Anxiety state 03/16/2007  . Essential hypertension 03/16/2007  . Osteoarthritis 03/16/2007  . hx: breast cancer, left, infiltrating ductal 03/16/2007    Past Surgical History:  Procedure Laterality Date  . ABDOMINAL HYSTERECTOMY    . APPENDECTOMY  1972  . BREAST LUMPECTOMY Left 2001  . CARPAL TUNNEL RELEASE    . CATARACT EXTRACTION    . EYE SURGERY     cataract ext  . HERNIA REPAIR    . MASTECTOMY, PARTIAL Left   . SKIN CANCER EXCISION     face-Dr. Bing Plume  . TOTAL KNEE ARTHROPLASTY    . TOTAL KNEE ARTHROPLASTY Right 2010   Dr Para March  . TOTAL KNEE ARTHROPLASTY  10/30/2011   Procedure: TOTAL KNEE ARTHROPLASTY;  Surgeon: Lorn Junes, MD;  Location: [facility];  Service: Orthopedics;  Laterality: Left;     . XRT     chemo, surgery    OB History    No data available       Home Medications    Prior to Admission medications   Medication Sig Start Date End Date Taking? Authorizing Provider  ALPRAZolam Duanne Moron) 0.5 MG tablet TAKE 1 TABLET BY MOUTH TWICE A DAY AS NEEDED 05/17/16  Yes Evie Lacks Plotnikov, MD  amiodarone (PACERONE) 200 MG tablet TAKE 1 TABLET (200 MG TOTAL) BY MOUTH 2 (TWO) TIMES DAILY. 03/13/16  Yes Josue Hector, MD  apixaban (ELIQUIS) 2.5 MG TABS tablet Take 1 tablet (2.5 mg total) by mouth 2 (two) times daily. 05/16/16  Yes Shaune Pascal Bensimhon, MD  bisoprolol (ZEBETA) 5 MG tablet Take 0.5 tablets (2.5 mg total) by mouth daily. 03/20/16  Yes Cassandria Anger, MD  Calcium Carbonate-Vitamin D (CALCIUM 600+D) 600-400 MG-UNIT per tablet Take 1 tablet by mouth daily.   Yes Historical Provider, MD  Fluticasone-Salmeterol (ADVAIR DISKUS) 250-50 MCG/DOSE  AEPB Inhale 1 puff into the lungs 2 (two) times daily. 05/12/15  Yes Cassandria Anger, MD  Multiple Vitamins-Minerals (MULTIVITAMIN PO) Take 1 tablet by mouth daily.    Yes Historical Provider, MD  mupirocin ointment (BACTROBAN) 2 % Use qd Patient taking differently: Apply 1 application topically daily.  04/26/16  Yes Evie Lacks Plotnikov, MD  torsemide (DEMADEX) 20 MG tablet Take 2 tablets (40 mg total) by mouth 2 (two) times daily. 02/08/16  Yes Amy D Clegg, NP  vitamin C (ASCORBIC ACID) 500 MG tablet Take 1 tablet (500 mg total) by mouth daily. 03/14/16  Yes Cassandria Anger, MD    Family History Family History  Problem Relation Age of Onset  . Dementia Mother   . Heart disease Mother   . Mental retardation Mother   . Hypertension Mother   . Alzheimer's disease Mother   . Heart attack Father   . Alcohol abuse Father   . Prostate cancer Brother   . Diabetes Other   . Colon cancer Neg  Hx   . Anesthesia problems Neg Hx     Social History Social History  Substance Use Topics  . Smoking status: Former Smoker    Quit date: 12/20/1980  . Smokeless tobacco: Never Used  . Alcohol use Yes     Comment: socially     Allergies   Cefuroxime; Celecoxib; Deltasone [prednisone]; Ranitidine; and Tramadol hcl   Review of Systems Review of Systems  Constitutional: Negative for fever.  HENT: Negative for trouble swallowing and voice change.   Eyes: Negative for visual disturbance.  Respiratory: Positive for shortness of breath. Negative for cough.   Cardiovascular: Negative for chest pain and leg swelling.  Gastrointestinal: Positive for nausea. Negative for abdominal pain and vomiting.  Genitourinary: Negative for flank pain.  Musculoskeletal: Negative for back pain.  Skin: Negative for rash.  Neurological: Negative for headaches.     Physical Exam Updated Vital Signs BP (!) 95/39   Pulse (!) 57   Temp 97.9 F (36.6 C) (Oral)   Resp 19   Ht 5\' 4"  (1.626 m)   Wt 52.2 kg    SpO2 100%   BMI 19.74 kg/m   Physical Exam  Constitutional: She is oriented to person, place, and time. She appears well-developed and well-nourished.  Pleasant, cooperative, elderly and ill-appearing  HENT:  Head: Normocephalic and atraumatic.  Eyes: Conjunctivae and EOM are normal. Pupils are equal, round, and reactive to light. No scleral icterus.  Neck: Normal range of motion. Neck supple. JVD present.  Cardiovascular: Intact distal pulses.   Murmur heard. Bradycardic to 54-27'C, holosystolic murmur  Pulmonary/Chest: Effort normal. No respiratory distress. She has rales.  Rales b/l bases  Abdominal: Soft. She exhibits no distension. There is no tenderness.  Musculoskeletal: She exhibits no edema or tenderness.  Symmetric size and appearance of b/l Le's, no calf swelling or tenderness.   Neurological: She is alert and oriented to person, place, and time. She exhibits normal muscle tone. Coordination normal.  Skin: Skin is warm and dry. Capillary refill takes 2 to 3 seconds. No rash noted. She is not diaphoretic. There is pallor.  Psychiatric: She has a normal mood and affect.  Nursing note and vitals reviewed.    ED Treatments / Results  Labs (all labs ordered are listed, but only abnormal results are displayed) Labs Reviewed  COMPREHENSIVE METABOLIC PANEL - Abnormal; Notable for the following:       Result Value   Potassium >7.5 (*)    CO2 16 (*)    Glucose, Bld 105 (*)    BUN 85 (*)    Creatinine, Ser 5.28 (*)    Total Protein 5.5 (*)    Albumin 3.2 (*)    AST 76 (*)    ALT 75 (*)    GFR calc non Af Amer 7 (*)    GFR calc Af Amer 8 (*)    All other components within normal limits  CBC WITH DIFFERENTIAL/PLATELET - Abnormal; Notable for the following:    RBC 3.02 (*)    Hemoglobin 8.9 (*)    HCT 29.7 (*)    RDW 18.8 (*)    Lymphs Abs 0.5 (*)    All other components within normal limits  I-STAT CG4 LACTIC ACID, ED - Abnormal; Notable for the following:    Lactic  Acid, Venous 4.60 (*)    All other components within normal limits  I-STAT CHEM 8, ED - Abnormal; Notable for the following:    Potassium 8.2 (*)  BUN 93 (*)    Creatinine, Ser 5.20 (*)    Glucose, Bld 102 (*)    Calcium, Ion 1.10 (*)    Hemoglobin 9.9 (*)    HCT 29.0 (*)    All other components within normal limits  CBG MONITORING, ED - Abnormal; Notable for the following:    Glucose-Capillary 101 (*)    All other components within normal limits  BASIC METABOLIC PANEL  LACTIC ACID, PLASMA  LACTIC ACID, PLASMA  I-STAT TROPOININ, ED    EKG  EKG Interpretation  Date/Time:  Tuesday June 06 2016 19:25:01 EDT Ventricular Rate:  131 PR Interval:    QRS Duration: 269 QT Interval:  358 QTC Calculation: 611 R Axis:   -165 Text Interpretation:  Atrial fibrillation Paired ventricular premature complexes Nonspecific intraventricular conduction delay Repol abnrm, probable ischemia, lateral leads improved rate Confirmed by [facility] MD, Corene Cornea [phone]) on 06/06/2016 8:01:36 PM       Radiology Dg Chest Portable 1 View  Result Date: 06/06/2016 CLINICAL DATA:  Found on floor at home confused crawling around, history COPD, renal failure, hypertension EXAM: PORTABLE CHEST 1 VIEW COMPARISON:  Portable exam 1957 hours compared to 02/03/2016 FINDINGS: Enlargement of cardiac silhouette. Atherosclerotic calcification aorta. Mediastinal contours and pulmonary vascularity normal for slight degree of rotation to the RIGHT. Minimal atelectasis at RIGHT base. Atelectasis versus consolidation RIGHT lower lobe. External pacing leads project over chest. Upper lungs clear. No gross pleural effusion or pneumothorax. Bones demineralized with probable BILATERAL chronic rotator cuff tears. IMPRESSION: Enlargement of cardiac silhouette. Minimal RIGHT basilar atelectasis with atelectasis versus consolidation LEFT lower lobe. Electronically Signed   By: Lavonia Dana M.D.   On: 06/06/2016 20:33     Procedures Procedures (including critical care time)  Medications Ordered in ED Medications  albuterol (PROVENTIL,VENTOLIN) solution continuous neb (0 mg/hr Nebulization Stopped 06/06/16 2150)  sodium bicarbonate 100 mEq in dextrose 5 % 1,000 mL infusion (1,000 mLs Intravenous Rate/Dose Change 06/06/16 2324)  amiodarone (PACERONE) tablet 200 mg (not administered)  ALPRAZolam (XANAX) tablet 0.5 mg (0.5 mg Oral Given 06/06/16 2324)  mometasone-formoterol (DULERA) 200-5 MCG/ACT inhaler 2 puff (2 puffs Inhalation Not Given 06/07/16 0022)  heparin injection 5,000 Units (not administered)  calcium gluconate 1 g in sodium chloride 0.9 % 100 mL IVPB (0 g Intravenous Stopped 06/06/16 2049)  albuterol (PROVENTIL) (2.5 MG/3ML) 0.083% nebulizer solution 5 mg (5 mg Nebulization Given 06/06/16 1938)  sodium bicarbonate injection (100 mEq Intravenous Given 06/06/16 1928)  calcium chloride injection (1 g Intravenous Given 06/06/16 1920)  0.9 %  sodium chloride infusion (500 mLs Intravenous New Bag/Given 06/06/16 1930)  dextrose 50 % solution 50 mL (50 mLs Intravenous Given 06/06/16 1948)  sodium chloride 0.9 % bolus 500 mL (0 mLs Intravenous Stopped 06/06/16 2006)  insulin aspart (novoLOG) injection 10 Units (10 Units Intravenous Given 06/06/16 1948)  sodium bicarbonate injection 50 mEq (50 mEq Intravenous Given 06/06/16 2027)  calcium gluconate inj 10% (1 g) URGENT USE ONLY! (1 g Intravenous Given 06/06/16 2027)  furosemide (LASIX) injection 80 mg (80 mg Intravenous Given 06/06/16 2324)  sodium chloride 0.9 % bolus 500 mL (500 mLs Intravenous New Bag/Given 06/07/16 0009)     Initial Impression / Assessment and Plan / ED Course  I have reviewed the triage vital signs and the nursing notes.  Pertinent labs & imaging results that were available during my care of the patient were reviewed by me and considered in my medical decision making (see chart for details).  Clinical  Course   Mia Contreras is a 82 y.o. female with stage 4 CKD (baseline Cr 1.5-2), COPD (home O2 at night only, none during day), HTN, HLD, atrial fibrillation who presents to ED via EMS from home for confusion, found on floor by neighbor, no evidence of traumatic injury, no headaches, no focal weakness. Pt endorses fatigue and dyspnea x today. Found to be bradycardic to 20's by EMS, found to have profound QRS widening, near sine-wave on EKG. Immediately given CaCl IV for cardiac stabilization, then calcium gluconate, insulin, D50, bicarb, albuterol. QRS narrows with improvement of rate to 50's and improved mentation. Labs confirm hyperkalemia to 8.2 and worsening Cr 5.2, elevated BUN. Suspect AKI on top of pt taking Kcl and spironolactone.   Consulted critical care, nephrology. As no CRRT for pt advised, admitted to medicine services stepdown unit with critical care and nephrology to follow. Pt required redosing of calcium gluconate and other cardiac stabilizing medications while in Ed. Updated brother and pt with plan. Remains in critical condition but mentating well.  Pt condition, course, and admission were discussed with attending physician Dr. Merrily Pew.  Final Clinical Impressions(s) / ED Diagnoses   Final diagnoses:  Hyperkalemia  Symptomatic bradycardia  Shortness of breath    New Prescriptions New Prescriptions   No medications on file     Paralee Cancel, MD 06/07/16 0040    Merrily Pew, MD 06/08/16 1610

## 2016-06-06 NOTE — ED Notes (Signed)
Intensivist at bedside.

## 2016-06-07 DIAGNOSIS — D631 Anemia in chronic kidney disease: Secondary | ICD-10-CM

## 2016-06-07 DIAGNOSIS — I5021 Acute systolic (congestive) heart failure: Secondary | ICD-10-CM

## 2016-06-07 LAB — LACTIC ACID, PLASMA
LACTIC ACID, VENOUS: 1.5 mmol/L (ref 0.5–1.9)
LACTIC ACID, VENOUS: 2.4 mmol/L — AB (ref 0.5–1.9)
Lactic Acid, Venous: 2.9 mmol/L (ref 0.5–1.9)
Lactic Acid, Venous: 1.9 mmol/L (ref 0.5–1.9)

## 2016-06-07 LAB — IRON AND TIBC
IRON: 15 ug/dL — AB (ref 28–170)
SATURATION RATIOS: 4 % — AB (ref 10.4–31.8)
TIBC: 340 ug/dL (ref 250–450)
UIBC: 325 ug/dL

## 2016-06-07 LAB — BASIC METABOLIC PANEL
ANION GAP: 11 (ref 5–15)
BUN: 78 mg/dL — ABNORMAL HIGH (ref 6–20)
CALCIUM: 10 mg/dL (ref 8.9–10.3)
CO2: 24 mmol/L (ref 22–32)
Chloride: 104 mmol/L (ref 101–111)
Creatinine, Ser: 4.66 mg/dL — ABNORMAL HIGH (ref 0.44–1.00)
GFR, EST AFRICAN AMERICAN: 9 mL/min — AB (ref >60)
GFR, EST NON AFRICAN AMERICAN: 8 mL/min — AB (ref >60)
Glucose, Bld: 119 mg/dL — ABNORMAL HIGH (ref 65–99)
Potassium: 5.5 mmol/L — ABNORMAL HIGH (ref 3.5–5.1)
SODIUM: 139 mmol/L (ref 135–145)

## 2016-06-07 LAB — MRSA PCR SCREENING: MRSA BY PCR: NEGATIVE

## 2016-06-07 MED ORDER — ALBUTEROL SULFATE (2.5 MG/3ML) 0.083% IN NEBU: 2.5000 mg | INHALATION_SOLUTION | RESPIRATORY_TRACT | Status: DC | PRN

## 2016-06-07 MED ORDER — SODIUM CHLORIDE 0.9 % IV BOLUS (SEPSIS)
500.0000 mL | Freq: Once | INTRAVENOUS | Status: AC
  Administered 2016-06-07: 500 mL via INTRAVENOUS

## 2016-06-07 MED ORDER — ONDANSETRON HCL 4 MG/2ML IJ SOLN: 4.0000 mg | Freq: Four times a day (QID) | INTRAMUSCULAR | Status: DC | PRN

## 2016-06-07 MED ORDER — ACETAMINOPHEN 325 MG PO TABS
650.0000 mg | ORAL_TABLET | Freq: Four times a day (QID) | ORAL | Status: DC | PRN
  Administered 2016-06-08 – 2016-06-11 (×5): 650 mg via ORAL
  Filled 2016-06-07 (×5): qty 2

## 2016-06-07 MED ORDER — DARBEPOETIN ALFA 100 MCG/0.5ML IJ SOSY
100.0000 ug | PREFILLED_SYRINGE | INTRAMUSCULAR | Status: DC
  Administered 2016-06-07: 100 ug via SUBCUTANEOUS
  Filled 2016-06-07 (×2): qty 0.5

## 2016-06-07 NOTE — ED Notes (Signed)
Alcario Drought MD at bedside, aware of pt BP, give another 546ml bolus NS.

## 2016-06-07 NOTE — Consult Note (Addendum)
[facility] Nurse wound consult note Reason for Consult:Bilateral Lower Extremity Compression Wraps Wound type:Full thickness wound/RLE Pressure Ulcer POA: Yes Measurement:4x1.5x0.2cm Wound [id number] Slough Drainage (amount, consistency, odor) Small Serosangenous Periwound:Intact Dressing procedure/placement/frequency:Applied Silver Alginate to wound bed covered with Profore Lite(3 layer compression wrap)CHange 3 times a week  [facility] Nurse wound consult note Reason for Consult:Bilateral Lower Extremity Compression Wraps Wound type:Full thickness wound/RUE Pressure Ulcer POA: Yes Measurement:4.5x7x0.1cm  Wound JTT:SVXB Drainage (amount, consistency, odor) Small Serosangenous Periwound:Intact Dressing procedure/placement/frequency:Applied Silver Alginate to wound bed covered with Profore Lite(3 layer compression wrap)CHange 3 times a week  [facility] Nurse wound consult note Reason for Consult:Bilateral Lower Extremity Compression Wraps Wound type:Full thickness wound/ Left superior lower leg Pressure Ulcer POA: Yes Measurement:1.5x1x0.1cm  Wound [id number] Slough Drainage (amount, consistency, odor) Small Serosangenous Periwound:Intact Dressing procedure/placement/frequency:Applied Silver Alginate to wound bed covered with Profore Lite(3 layer compression wrap)CHange 3 times a week [facility] Nurse wound consult note Reason for Consult:Bilateral Lower Extremity Compression Wraps Wound typeFull thickness wound/Left distal lower leg Pressure Ulcer POA: Yes Measurement:2x1x0.1cm Wound [id number]:[id number] Slough Drainage (amount, consistency, odor) Small Serosangenous Periwound:Intact Dressing procedure/placement/frequency:Applied Silver Alginate to wound bed covered with Profore Lite(3 layer compression wrap)CHange 3 times a week [facility] Nurse wound consult note Reason for Consult:Bilateral Lower Extremity Compression Wraps Wound type:Full thickness wound/ Lt Posterior lower leg Pressure Ulcer POA:  Yes Measurement:4x3x0.1cm Wound JFH:[id number] Slough Drainage (amount, consistency, odor) Small Serosangenous Periwound:Intact Dressing procedure/placement/frequency:Applied Silver Alginate to wound bed covered with Profore Lite(3 layer compression wrap)CHange 3 times a week WOC will plan to change dressing on Friday if patient is still in the hospital at that time.  Pt can follow-up with the outpatient wound clinic after discharge. Julien Girt MSN, RN, [place], [place], [place]

## 2016-06-07 NOTE — Progress Notes (Signed)
Text page to DR Ghimire to make aware of BP of 80/43 Pt also noted to periods of sleep apnea. On 2 L n/c sats drop as low as 83 during these periods. Pt awakened encouraged to take a deep breath and sats right back up to 95 %.

## 2016-06-07 NOTE — ED Notes (Signed)
Alcario Drought MD aware of drop in pt BP, verbal order to admin another 562ml bolus NS

## 2016-06-07 NOTE — Progress Notes (Signed)
Informed Tylene Fantasia of pt SBP between 70's-80's. Manual BP 82/54. No new orders given.

## 2016-06-07 NOTE — Progress Notes (Signed)
[facility] ROUNDING NOTE   Subjective:   Interval History: appears frail  BP low   Objective:  Vital signs in last 24 hours:  Temp:  [93.1 F (33.9 C)-97.9 F (36.6 C)] 97.3 F (36.3 C) (10/18 0800) Pulse Rate:  [40-70] 59 (10/18 0800) Resp:  [12-27] 20 (10/18 0800) BP: (68-173)/(37-120) 92/46 (10/18 0800) SpO2:  [90 %-100 %] 91 % (10/18 0800) Weight:  [52.2 kg (115 lb)] 52.2 kg (115 lb) (10/17 1926)  Weight change:  Filed Weights   06/06/16 1926  Weight: 52.2 kg (115 lb)    Intake/Output: I/O last 3 completed shifts: In: 1924.8 [P.O.:240; I.V.:1184.8; IV Piggyback:500] Out: 675 [Urine:675]   Intake/Output this shift:  No intake/output data recorded.  General: elderly , pleasant,  HEENT: PERRLA, EOMI Neck: no JVD Heart: brady Lungs: poor effort, mostly clear using oxygen  Abdomen: soft, non tender Extremities: pitting edema throughout legs- has UNA boots, foul smelling odor Skin: warm and dry    Basic Metabolic Panel:  Recent Labs Lab 06/06/16 1926 06/06/16 1930 06/07/16 0301  NA 137 136 139  K 8.2* >7.5* 5.5*  CL 109 107 104  CO2  --  16* 24  GLUCOSE 102* 105* 119*  BUN 93* 85* 78*  CREATININE 5.20* 5.28* 4.66*  CALCIUM  --  9.1 10.0    Liver Function Tests:  Recent Labs Lab 06/06/16 1930  AST 76*  ALT 75*  ALKPHOS 98  BILITOT 1.1  PROT 5.5*  ALBUMIN 3.2*   No results for input(s): LIPASE, AMYLASE in the last 168 hours. No results for input(s): AMMONIA in the last 168 hours.  CBC:  Recent Labs Lab 06/05/16 1449 06/06/16 1926 06/06/16 1930  WBC  --   --  8.6  NEUTROABS  --   --  7.3  HGB 9.2* 9.9* 8.9*  HCT  --  29.0* 29.7*  MCV  --   --  98.3  PLT  --   --  275    Cardiac Enzymes: No results for input(s): CKTOTAL, CKMB, CKMBINDEX, TROPONINI in the last 168 hours.  BNP: Invalid input(s): POCBNP  CBG:  Recent Labs Lab 06/06/16 1931  GLUCAP 101*    Microbiology: Results for orders placed or  performed during the hospital encounter of 06/06/16  MRSA PCR Screening     Status: None   Collection Time: 06/07/16  2:48 AM  Result Value Ref Range Status   MRSA by PCR NEGATIVE NEGATIVE Final    Comment:        The GeneXpert MRSA Assay (FDA approved for NASAL specimens only), is one component of a comprehensive MRSA colonization surveillance program. It is not intended to diagnose MRSA infection nor to guide or monitor treatment for MRSA infections.     Coagulation Studies: No results for input(s): LABPROT, INR in the last 72 hours.  Urinalysis: No results for input(s): COLORURINE, LABSPEC, PHURINE, GLUCOSEU, HGBUR, BILIRUBINUR, KETONESUR, PROTEINUR, UROBILINOGEN, NITRITE, LEUKOCYTESUR in the last 72 hours.  Invalid input(s): APPERANCEUR    Imaging: Dg Chest Portable 1 View  Result Date: 06/06/2016 CLINICAL DATA:  Found on floor at home confused crawling around, history COPD, renal failure, hypertension EXAM: PORTABLE CHEST 1 VIEW COMPARISON:  Portable exam 1957 hours compared to 02/03/2016 FINDINGS: Enlargement of cardiac silhouette. Atherosclerotic calcification aorta. Mediastinal contours and pulmonary vascularity normal for slight degree of rotation to the RIGHT. Minimal atelectasis at RIGHT base. Atelectasis versus consolidation RIGHT lower lobe. External pacing leads project over chest. Upper lungs clear. No  gross pleural effusion or pneumothorax. Bones demineralized with probable BILATERAL chronic rotator cuff tears. IMPRESSION: Enlargement of cardiac silhouette. Minimal RIGHT basilar atelectasis with atelectasis versus consolidation LEFT lower lobe. Electronically Signed   By: Lavonia Dana M.D.   On: 06/06/2016 20:33     Medications:   . albuterol Stopped (06/06/16 2150)  .  sodium bicarbonate  infusion 1000 mL 1,000 mL (06/06/16 2324)   . amiodarone  200 mg Oral BID  . heparin  5,000 Units Subcutaneous Q8H  . mometasone-formoterol  2 puff Inhalation BID    ALPRAZolam  Assessment/ Plan:  Assessment/Plan: 82 year old WF with CHF- EF 30%, progressive CKD felt not to be a candidate for dialysis presenting with altered MS- AKI on CKD as well as hyperkalemia causing bradycardia, now resoloved  1.Renal- known CKD that is fairly advanced.     determined to not be a candidate for dialysis and pt agrees. 2; Hyperkalemia- Resolved  3. Hypertension/volume  - massively volume overloaded but BP is soft- diuresis will be difficult due to hypotension 4. Anemia  - Will evaluate for aranesp check iron levels   Patient well known to me  I am not sure there is much more to add and feel that palliative care is appropriate    LOS: 1 Mia Contreras W @TODAY @12 :17 PM

## 2016-06-07 NOTE — Progress Notes (Addendum)
PROGRESS NOTE        PATIENT DETAILS Name: Mia Contreras Age: 82 y.o. Sex: female Date of Birth: 19-Oct-1928 Admit Date: 06/06/2016 Admitting Physician Etta Quill, DO WNU:UVOZ Plotnikov, MD  Brief Narrative: Patient is a 82 y.o. female systolic heart failure, chronic kidney disease stage IV, atrial fibrillation on anticoagulation presented to the ED with weakness, confusion and shortness of breath. She was found to have severe hyperkalemia, acute on chronic kidney disease and severe bradycardia. She was given IV insulin, IV calcium gluconate and placed on a bicarbonate infusion and admitted to the hospitalist service. She was seen in consult by nephrology and felt not to be a great candidate for long-term dialysis. See below for further details  Subjective: Looks frail-no longer confused. Denies any recent nausea or vomiting. Does not appear short of breath.  Denies chest pain Denies shortness of breath Denies nausea or vomiting  Assessment/Plan: Principal Problem: Hyperkalemia: Multifactorial etiology-secondary to acute on chronic renal failure, and Aldactone. Potassium much better following initiation of bicarbonate infusion and IV insulin. Continue to hold Aldactone, follow electrolytes.  Active Problems: Acute on chronic kidney disease stage IV: ARF thought to be hemodynamically mediated-probably secondary to Aldactone, cardiorenal syndrome. Evaluated by nephrology, felt not to be a candidate for dialysis. Recommendations are to continue with supportive care and have the patient be evaluated by palliative care medicine.  Acute on chronic systolic heart failure: Appears to be volume overloaded, blood blood pressure is very soft and unable to use diuretics at this time. Follow weights, await evaluation by palliative care. Off all antihypertensives and diuretics at this time.  Hypertension: Blood pressure soft, continue to hold all  antihypertensives.  Anemia: Probably secondary to chronic kidney disease, hemoglobin currently stable-no need for PRBC transfusion. No evidence of any overt blood loss.  History of Atrial fibrillation: Rate controlled-currently off the beta blockers/amiodarone and Eliquis (on hold due to ARF)-await evaluation by palliative care-follow renal function and clinical course-if appropriate will contemplate resuming anticoagulantion  History of lower extremity wounds: Chronic issue-present prior to admission-has UNNA boots bilaterally-wound care evaluation pending.  Hx breast cancer s/p chemo 2000  Palliative care/goals of care: Unfortunate elderly female with chronic systolic heart failure and stage IV chronic kidney disease-admitted with severe hyperkalemia and worsening renal function. Evaluated by nephrology, and not a dialysis candidate. She is aware of poor overall prognoses, DO NOT RESUSCITATE in place. Await palliative care evaluation for further recommendations.    DVT Prophylaxis: Prophylactic Heparin   Code Status:  DNR  Family Communication: None at bedside  Disposition Plan: Remain inpatient- SNF vs residential hospice on discharge  Antimicrobial agents: None  Procedures: None  CONSULTS:  pulmonary/intensive care and nephrology  Time spent: 25 minutes-Greater than 50% of this time was spent in counseling, explanation of diagnosis, planning of further management, and coordination of care.  MEDICATIONS: Anti-infectives    None      Scheduled Meds: . amiodarone  200 mg Oral BID  . darbepoetin (ARANESP) injection - NON-DIALYSIS  100 mcg Subcutaneous Q Wed-1800  . heparin  5,000 Units Subcutaneous Q8H  . mometasone-formoterol  2 puff Inhalation BID   Continuous Infusions: . albuterol Stopped (06/06/16 2150)  .  sodium bicarbonate  infusion 1000 mL 1,000 mL (06/06/16 2324)   PRN Meds:.ALPRAZolam   PHYSICAL EXAM: Vital signs: Vitals:   06/07/16 0615 06/07/16  0630 06/07/16 0645 06/07/16 0800  BP: (!) 88/51 (!) 86/50 (!) 89/50 (!) 92/46  Pulse: (!) 50 (!) 50 (!) 51 (!) 59  Resp: 18 15 18 20   Temp:    97.3 F (36.3 C)  TempSrc:    Oral  SpO2: 96% 93% 100% 91%  Weight:      Height:       Filed Weights   06/06/16 1926  Weight: 52.2 kg (115 lb)   Body mass index is 19.74 kg/m.   General appearance :Chronically ill-appearing frail elderly female Awake, alert, not in any distress. Speech Clear-but slow.  Eyes:, pupils equally reactive to light and accomodation,no scleral icterus.Pink conjunctiva HEENT: Atraumatic and Normocephalic Neck: supple, +JVD. No cervical lymphadenopathy. No thyromegaly Resp:Good air entry bilaterally, no added sounds  CVS: S1 S2 irregular, +syst murmur GI: Bowel sounds present, Non tender and not distended with no gaurding, rigidity or rebound.No organomegaly Extremities: B/L Lower Ext wrapped with UNNA boots Neurology:  speech clear,Non focal-but with generalized weakness, sensation is grossly intact. Psychiatric: Normal judgment and insight. Alert and oriented x 3.  Musculoskeletal:No digital cyanosis Skin:No Rash, warm and dry  I have personally reviewed following labs and imaging studies  LABORATORY DATA: CBC:  Recent Labs Lab 06/05/16 1449 06/06/16 1926 06/06/16 1930  WBC  --   --  8.6  NEUTROABS  --   --  7.3  HGB 9.2* 9.9* 8.9*  HCT  --  29.0* 29.7*  MCV  --   --  98.3  PLT  --   --  932    Basic Metabolic Panel:  Recent Labs Lab 06/06/16 1926 06/06/16 1930 06/07/16 0301  NA 137 136 139  K 8.2* >7.5* 5.5*  CL 109 107 104  CO2  --  16* 24  GLUCOSE 102* 105* 119*  BUN 93* 85* 78*  CREATININE 5.20* 5.28* 4.66*  CALCIUM  --  9.1 10.0    GFR: Estimated Creatinine Clearance: 7.4 mL/min (by C-G formula based on SCr of 4.66 mg/dL (H)).  Liver Function Tests:  Recent Labs Lab 06/06/16 1930  AST 76*  ALT 75*  ALKPHOS 98  BILITOT 1.1  PROT 5.5*  ALBUMIN 3.2*   No results for  input(s): LIPASE, AMYLASE in the last 168 hours. No results for input(s): AMMONIA in the last 168 hours.  Coagulation Profile: No results for input(s): INR, PROTIME in the last 168 hours.  Cardiac Enzymes: No results for input(s): CKTOTAL, CKMB, CKMBINDEX, TROPONINI in the last 168 hours.  BNP (last 3 results)  Recent Labs  12/01/15 1052  PROBNP 1,090.0*    HbA1C: No results for input(s): HGBA1C in the last 72 hours.  CBG:  Recent Labs Lab 06/06/16 1931  GLUCAP 101*    Lipid Profile: No results for input(s): CHOL, HDL, LDLCALC, TRIG, CHOLHDL, LDLDIRECT in the last 72 hours.  Thyroid Function Tests: No results for input(s): TSH, T4TOTAL, FREET4, T3FREE, THYROIDAB in the last 72 hours.  Anemia Panel: No results for input(s): VITAMINB12, FOLATE, FERRITIN, TIBC, IRON, RETICCTPCT in the last 72 hours.  Urine analysis:    Component Value Date/Time   COLORURINE YELLOW 01/29/2016 [place] 01/29/2016 1615   LABSPEC 1.011 01/29/2016 1615   PHURINE 7.5 01/29/2016 1615   GLUCOSEU NEGATIVE 01/29/2016 1615   GLUCOSEU NEGATIVE 05/06/2015 1008   HGBUR NEGATIVE 01/29/2016 1615   BILIRUBINUR NEGATIVE 01/29/2016 1615   KETONESUR NEGATIVE 01/29/2016 1615   PROTEINUR NEGATIVE 01/29/2016 1615   UROBILINOGEN 0.2 05/06/2015 1008  NITRITE NEGATIVE 01/29/2016 1615   LEUKOCYTESUR NEGATIVE 01/29/2016 1615    Sepsis Labs: Lactic Acid, Venous    Component Value Date/Time   LATICACIDVEN 1.5 06/07/2016 8350    MICROBIOLOGY: Recent Results (from the past 240 hour(s))  MRSA PCR Screening     Status: None   Collection Time: 06/07/16  2:48 AM  Result Value Ref Range Status   MRSA by PCR NEGATIVE NEGATIVE Final    Comment:        The GeneXpert MRSA Assay (FDA approved for NASAL specimens only), is one component of a comprehensive MRSA colonization surveillance program. It is not intended to diagnose MRSA infection nor to guide or monitor treatment for MRSA  infections.     RADIOLOGY STUDIES/RESULTS: Dg Chest Portable 1 View  Result Date: 06/06/2016 CLINICAL DATA:  Found on floor at home confused crawling around, history COPD, renal failure, hypertension EXAM: PORTABLE CHEST 1 VIEW COMPARISON:  Portable exam 1957 hours compared to 02/03/2016 FINDINGS: Enlargement of cardiac silhouette. Atherosclerotic calcification aorta. Mediastinal contours and pulmonary vascularity normal for slight degree of rotation to the RIGHT. Minimal atelectasis at RIGHT base. Atelectasis versus consolidation RIGHT lower lobe. External pacing leads project over chest. Upper lungs clear. No gross pleural effusion or pneumothorax. Bones demineralized with probable BILATERAL chronic rotator cuff tears. IMPRESSION: Enlargement of cardiac silhouette. Minimal RIGHT basilar atelectasis with atelectasis versus consolidation LEFT lower lobe. Electronically Signed   By: Lavonia Dana M.D.   On: 06/06/2016 20:33     LOS: 1 day   Oren Binet, MD  [facility] Pager:[phone]  If 7PM-7AM, please contact night-coverage [email or web address] Password [facility] 06/07/2016, 12:33 PM

## 2016-06-07 NOTE — ED Notes (Signed)
4E Charge RN aware Alcario Drought MD wants to hold pt until BP stable then pt can be transported upstairs

## 2016-06-07 NOTE — Progress Notes (Signed)
CRITICAL VALUE ALERT  Critical value received:  Lactic acid 2.9  Date of notification:  06/07/16   Time of notification:  9249  Critical value read back:Yes.    Nurse who received alert:  Reap, Jon Gills   MD notified (1st page):  Baltazar Najjar, NP  Time of first page:  0406  MD notified (2nd page):  Time of second page:  Responding MD:    Time MD responded:

## 2016-06-07 NOTE — Significant Event (Signed)
BP drop to 44Q systolic: 1) 190 cc NS bolus 2) holding beta blocker dose for tomorrow

## 2016-06-07 NOTE — ED Notes (Signed)
Repeat EKG obtained and given to MD

## 2016-06-07 NOTE — ED Notes (Signed)
Alcario Drought MD at bedside

## 2016-06-08 DIAGNOSIS — R001 Bradycardia, unspecified: Secondary | ICD-10-CM

## 2016-06-08 LAB — RENAL FUNCTION PANEL
Albumin: 2.7 g/dL — ABNORMAL LOW (ref 3.5–5.0)
Anion gap: 6 (ref 5–15)
BUN: 63 mg/dL — AB (ref 6–20)
CHLORIDE: 105 mmol/L (ref 101–111)
CO2: 30 mmol/L (ref 22–32)
CREATININE: 3.93 mg/dL — AB (ref 0.44–1.00)
Calcium: 8.8 mg/dL — ABNORMAL LOW (ref 8.9–10.3)
GFR calc Af Amer: 11 mL/min — ABNORMAL LOW (ref >60)
GFR calc non Af Amer: 10 mL/min — ABNORMAL LOW (ref >60)
Glucose, Bld: 88 mg/dL (ref 65–99)
Phosphorus: 5.1 mg/dL — ABNORMAL HIGH (ref 2.5–4.6)
Potassium: 5 mmol/L (ref 3.5–5.1)
Sodium: 141 mmol/L (ref 135–145)

## 2016-06-08 MED ORDER — SODIUM CHLORIDE 0.9 % IV SOLN
250.0000 mg | Freq: Every day | INTRAVENOUS | Status: AC
  Administered 2016-06-08 – 2016-06-11 (×4): 250 mg via INTRAVENOUS
  Filled 2016-06-08 (×9): qty 20

## 2016-06-08 NOTE — Progress Notes (Signed)
Palliative Medicine RN Note: Palliative Care order rec'd. Called Grand nephew to set up call with Dr Hilma Favors for tomorrow am at [place]. Left message with PMT contact info in case that time will not work.  Marjie Skiff Gurtaj Ruz, RN, BSN, [facility] 06/08/2016 11:03 AM Cell [phone] 8:00-4:00 Monday-Friday Office [phone]

## 2016-06-08 NOTE — Evaluation (Signed)
Physical Therapy Evaluation Patient Details Name: Mia Contreras MRN: [id number] DOB: 1929-01-29 Today's Date: 06/08/2016   History of Present Illness  Patient is a 82 y.o. female systolic heart failure, chronic kidney disease stage IV, atrial fibrillation on anticoagulation presented to the ED with weakness, confusion and shortness of breath. She was found to have severe hyperkalemia, acute on chronic kidney disease and severe bradycardia. She was given IV insulin, IV calcium gluconate and placed on a bicarbonate infusion and admitted to the hospitalist service. She was seen in consult by nephrology and felt not to be a great candidate for long-term dialysis.  Clinical Impression  Pt admitted with above diagnosis. Pt currently with functional limitations due to the deficits listed below (see PT Problem List). Pt was able to stand and pivot to recliner and to 3N1 with mod assist of 2 with heavy posterior lean.  Will need SNF at d/c.   Pt will benefit from skilled PT to increase their independence and safety with mobility to allow discharge to the venue listed below.     Follow Up Recommendations SNF;Supervision/Assistance - 24 hour    Equipment Recommendations  None recommended by PT    Recommendations for Other Services       Precautions / Restrictions Precautions Precautions: Fall Restrictions Weight Bearing Restrictions: No      Mobility  Bed Mobility Overal bed mobility: Needs Assistance Bed Mobility: Supine to Sit     Supine to sit: Min assist     General bed mobility comments: Needed assist to elevated trunk  Transfers Overall transfer level: Needs assistance Equipment used: 2 person hand held assist Transfers: Sit to/from Stand;Stand Pivot Transfers Sit to Stand: Mod assist;+2 physical assistance Stand pivot transfers: Mod assist;+2 physical assistance       General transfer comment: Needed assist for sit to stand and stand pivot to chair as pt with significant  posterior lean.  Also needed cues to move LEs around for pivot transfer.Pt urinated as she stepped to recliner.  Got 3N1 and transferred pt to 3N1 so she could finish urinating.  Then cleaned pt and she pivoted back to recliner.   Ambulation/Gait                Stairs            Wheelchair Mobility    Modified Rankin (Stroke Patients Only)       Balance Overall balance assessment: Needs assistance Sitting-balance support: No upper extremity supported;Feet supported Sitting balance-Leahy Scale: Fair   Postural control: Posterior lean Standing balance support: Bilateral upper extremity supported;During functional activity Standing balance-Leahy Scale: Poor Standing balance comment: relies on UE support with significant posterior lean.                              Pertinent Vitals/Pain Pain Assessment: Faces Faces Pain Scale: Hurts even more Pain Location: bil LEs Pain Descriptors / Indicators: Aching;Grimacing;Guarding Pain Intervention(s): Limited activity within patient's tolerance;Monitored during session;Repositioned  HR 48-50 bpm, BP 78/54 initially and 74/59 once in chair.  O2 100% on 2LO2.      Home Living Family/patient expects to be discharged to:: Private residence Living Arrangements: Alone Available Help at Discharge: Friend(s);Available PRN/intermittently Type of Home: House (townhome) Home Access: Stairs to enter Entrance Stairs-Rails: None Entrance Stairs-Number of Steps: 1 Home Layout: One level Home Equipment: Environmental consultant - 2 wheels;Walker - 4 wheels;Grab bars - tub/shower;Shower seat;Other (comment) (home O2)  Prior Function Level of Independence: Independent with assistive device(s);Needs assistance   Gait / Transfers Assistance Needed: Pt reports she used RW outdoors and rollator indoors.    ADL's / Homemaking Assistance Needed: Pt Independent        Hand Dominance   Dominant Hand: Right    Extremity/Trunk Assessment    Upper Extremity Assessment: Defer to OT evaluation           Lower Extremity Assessment: Generalized weakness      Cervical / Trunk Assessment: Kyphotic  Communication   Communication: No difficulties  Cognition Arousal/Alertness: Awake/alert Behavior During Therapy: WFL for tasks assessed/performed Overall Cognitive Status: Within Functional Limits for tasks assessed                      General Comments General comments (skin integrity, edema, etc.): Bil LEs with coban wrappings.     Exercises General Exercises - Lower Extremity Ankle Circles/Pumps: AROM;Both;10 reps;Seated Long Arc Quad: AROM;Both;10 reps;Seated   Assessment/Plan    PT Assessment Patient needs continued PT services  PT Problem List Decreased activity tolerance;Decreased balance;Decreased mobility;Decreased knowledge of use of DME;Decreased safety awareness;Decreased knowledge of precautions;Decreased skin integrity;Pain          PT Treatment Interventions DME instruction;Gait training;Functional mobility training;Therapeutic activities;Therapeutic exercise;Balance training;Patient/family education    PT Goals (Current goals can be found in the Care Plan section)  Acute Rehab PT Goals Patient Stated Goal: to get better PT Goal Formulation: With patient Time For Goal Achievement: 06/22/16 Potential to Achieve Goals: Good    Frequency Min 3X/week   Barriers to discharge Decreased caregiver support      Co-evaluation               End of Session Equipment Utilized During Treatment: Gait belt;Oxygen Activity Tolerance: Patient limited by fatigue Patient left: in chair;with call bell/phone within reach;with nursing/sitter in room Nurse Communication: Mobility status;Need for lift equipment         Time: 1222-1250 PT Time Calculation (min) (ACUTE ONLY): 28 min   Charges:   PT Evaluation $PT Eval Moderate Complexity: 1 Procedure PT Treatments $Therapeutic Activity: 8-22  mins   PT G Codes:        Cimberly Stoffel F 07/05/16, 1:40 PM  Henrick Mcgue,PT Acute Rehabilitation [phone] (pager)

## 2016-06-08 NOTE — Progress Notes (Signed)
PROGRESS NOTE        PATIENT DETAILS Name: Mia Contreras Age: 82 y.o. Sex: female Date of Birth: 1929-08-21 Admit Date: 06/06/2016 Admitting Physician Etta Quill, DO VCB:SWHQ Plotnikov, MD  Brief Narrative: Patient is a 82 y.o. female systolic heart failure, chronic kidney disease stage IV, atrial fibrillation on anticoagulation presented to the ED with weakness, confusion and shortness of breath. She was found to have severe hyperkalemia, acute on chronic kidney disease and severe bradycardia. She was given IV insulin, IV calcium gluconate and placed on a bicarbonate infusion and admitted to the hospitalist service. She was seen in consult by nephrology and felt not to be a great candidate for long-term dialysis. See below for further details  Subjective: Awake and alert   Denies chest pain Denies shortness of breath Denies nausea or vomiting  Assessment/Plan: Principal Problem: Hyperkalemia: Multifactorial etiology-secondary to acute on chronic renal failure, and Aldactone. Potassium has now normalized with IV bicarbonate infusion (now discontinued), she also received IV insulin in the emergency room. Aldactone remains on hold. Plans are to follow electrolytes and provided supportive care.   Active Problems: Acute on chronic kidney disease stage IV: ARF thought to be hemodynamically mediated-probably secondary to Aldactone, cardiorenal syndrome. Evaluated by nephrology, felt not to be a candidate for dialysis. Recommendations are to continue with supportive care, given very poor overall prognoses-and potential for further deterioration-nephrology recommends initiation of palliative measures. Awaiting evaluation by palliative care team.  Acute on chronic systolic heart failure: Appears to be volume overloaded, blood pressure is very soft and unable to use diuretics at this time. Patient is not amenable to aggressive care-given a very poor prognoses-we  will defer cardiology evaluation for inotropes/pressors at this time. Long discussion with the patient today at bedside along with nephrologist-Dr. Bryan Lemma will start focusing on supportive care rather than aggressive care at this time. Will await evaluation by the palliative care team for further delineation of close of care.  Hypertension: Blood pressure soft, continue to hold all antihypertensives.See above  Anemia: Probably secondary to chronic kidney disease, hemoglobin currently stable-no need for PRBC transfusion. No evidence of any overt blood loss.  History of Atrial fibrillation: Rate controlled-currently off the beta blockers/amiodarone and Eliquis (on hold due to ARF). Spoke with patient at bedside, given frail clinical status-significant chance of fall risk-she is agreeable to permanently discontinue anticoagulation at this time. Subsequently spoke with her niece over the phone, was agreeable as well. Family is aware of this difficult situation and the risk of cardiac embolic phenomenon including stroke. As noted above, patient does not desire aggressive care, and I suspect she will best benefit from initiation of palliative measures.  History of lower extremity wounds: Chronic issue-present prior to admission-has UNNA boots bilaterally-wound care evaluation appreciated.  Hx breast cancer s/p chemo 2000  Palliative care/goals of care: Unfortunate elderly female with chronic systolic heart failure and stage IV chronic kidney disease-admitted with severe hyperkalemia and worsening renal function. Evaluated by nephrology, and not a dialysis candidate. She is very frail and does not desire aggressive care at this time. She is aware of poor overall prognoses, DO NOT RESUSCITATE in place. She,however would like to go home with home health services-but does realize that she may need to be placed in a SNF. If she deteriorates further she would benefit from residential hospice. Long discussion with  the patient at bedside with nephrology and myself today, subsequently discussed with her niece over the phone. Awaiting palliative care evaluation for further recommendations.    DVT Prophylaxis: Prophylactic Heparin   Code Status:  DNR  Family Communication: Spoke with the niece over the phone  Disposition Plan: Remain inpatient- SNF vs residential hospice on discharge.  Antimicrobial agents: None  Procedures: None  CONSULTS:  pulmonary/intensive care and nephrology  Time spent: 25 minutes-Greater than 50% of this time was spent in counseling, explanation of diagnosis, planning of further management, and coordination of care.  MEDICATIONS: Anti-infectives    None      Scheduled Meds: . darbepoetin (ARANESP) injection - NON-DIALYSIS  100 mcg Subcutaneous Q Wed-1800  . ferric gluconate (FERRLECIT/NULECIT) IV  250 mg Intravenous Daily  . heparin  5,000 Units Subcutaneous Q8H  . mometasone-formoterol  2 puff Inhalation BID   Continuous Infusions: . albuterol Stopped (06/06/16 2150)   PRN Meds:.acetaminophen, albuterol, ALPRAZolam, ondansetron (ZOFRAN) IV   PHYSICAL EXAM: Vital signs: Vitals:   06/08/16 0600 06/08/16 0700 06/08/16 0800 06/08/16 0921  BP: (!) 103/51 129/71 (!) 102/44   Pulse: (!) 57 84 (!) 46   Resp: 14 11 17    Temp:  97.8 F (36.6 C)    TempSrc:  Oral    SpO2: 97% 100% 97% 97%  Weight:      Height:       Filed Weights   06/06/16 1926 06/08/16 0500  Weight: 52.2 kg (115 lb) 55.4 kg (122 lb 2.2 oz)   Body mass index is 20.96 kg/m.   General appearance :Chronically ill-appearing frail elderly female Awake, alert, not in any distress. Speech Clear-but slow.  Eyes:, pupils equally reactive to light and accomodation,no scleral icterus.Pink conjunctiva HEENT: Atraumatic and Normocephalic Neck: supple, +JVD. No cervical lymphadenopathy. No thyromegaly Resp:Good air entry bilaterally, no added sounds  CVS: S1 S2 irregular, +syst murmur GI:  Bowel sounds present, Non tender and not distended with no gaurding, rigidity or rebound.No organomegaly Extremities: B/L Lower Ext wrapped with UNNA boots Neurology:  speech clear,Non focal-but with generalized weakness, sensation is grossly intact. Psychiatric: Normal judgment and insight. Alert and oriented x 3.  Musculoskeletal:No digital cyanosis Skin:No Rash, warm and dry  I have personally reviewed following labs and imaging studies  LABORATORY DATA: CBC:  Recent Labs Lab 06/05/16 1449 06/06/16 1926 06/06/16 1930  WBC  --   --  8.6  NEUTROABS  --   --  7.3  HGB 9.2* 9.9* 8.9*  HCT  --  29.0* 29.7*  MCV  --   --  98.3  PLT  --   --  962    Basic Metabolic Panel:  Recent Labs Lab 06/06/16 1926 06/06/16 1930 06/07/16 0301 06/08/16 0328  NA 137 136 139 141  K 8.2* >7.5* 5.5* 5.0  CL 109 107 104 105  CO2  --  16* 24 30  GLUCOSE 102* 105* 119* 88  BUN 93* 85* 78* 63*  CREATININE 5.20* 5.28* 4.66* 3.93*  CALCIUM  --  9.1 10.0 8.8*  PHOS  --   --   --  5.1*    GFR: Estimated Creatinine Clearance: 9.2 mL/min (by C-G formula based on SCr of 3.93 mg/dL (H)).  Liver Function Tests:  Recent Labs Lab 06/06/16 1930 06/08/16 0328  AST 76*  --   ALT 75*  --   ALKPHOS 98  --   BILITOT 1.1  --   PROT 5.5*  --   ALBUMIN 3.2*  2.7*   No results for input(s): LIPASE, AMYLASE in the last 168 hours. No results for input(s): AMMONIA in the last 168 hours.  Coagulation Profile: No results for input(s): INR, PROTIME in the last 168 hours.  Cardiac Enzymes: No results for input(s): CKTOTAL, CKMB, CKMBINDEX, TROPONINI in the last 168 hours.  BNP (last 3 results)  Recent Labs  12/01/15 1052  PROBNP 1,090.0*    HbA1C: No results for input(s): HGBA1C in the last 72 hours.  CBG:  Recent Labs Lab 06/06/16 1931  GLUCAP 101*    Lipid Profile: No results for input(s): CHOL, HDL, LDLCALC, TRIG, CHOLHDL, LDLDIRECT in the last 72 hours.  Thyroid Function  Tests: No results for input(s): TSH, T4TOTAL, FREET4, T3FREE, THYROIDAB in the last 72 hours.  Anemia Panel:  Recent Labs  06/07/16 1246  TIBC 340  IRON 15*    Urine analysis:    Component Value Date/Time   COLORURINE YELLOW 01/29/2016 [place] 01/29/2016 1615   LABSPEC 1.011 01/29/2016 1615   PHURINE 7.5 01/29/2016 1615   GLUCOSEU NEGATIVE 01/29/2016 1615   GLUCOSEU NEGATIVE 05/06/2015 1008   HGBUR NEGATIVE 01/29/2016 1615   BILIRUBINUR NEGATIVE 01/29/2016 1615   KETONESUR NEGATIVE 01/29/2016 1615   PROTEINUR NEGATIVE 01/29/2016 1615   UROBILINOGEN 0.2 05/06/2015 1008   NITRITE NEGATIVE 01/29/2016 1615   LEUKOCYTESUR NEGATIVE 01/29/2016 1615    Sepsis Labs: Lactic Acid, Venous    Component Value Date/Time   LATICACIDVEN 1.5 06/07/2016 9211    MICROBIOLOGY: Recent Results (from the past 240 hour(s))  MRSA PCR Screening     Status: None   Collection Time: 06/07/16  2:48 AM  Result Value Ref Range Status   MRSA by PCR NEGATIVE NEGATIVE Final    Comment:        The GeneXpert MRSA Assay (FDA approved for NASAL specimens only), is one component of a comprehensive MRSA colonization surveillance program. It is not intended to diagnose MRSA infection nor to guide or monitor treatment for MRSA infections.     RADIOLOGY STUDIES/RESULTS: Dg Chest Portable 1 View  Result Date: 06/06/2016 CLINICAL DATA:  Found on floor at home confused crawling around, history COPD, renal failure, hypertension EXAM: PORTABLE CHEST 1 VIEW COMPARISON:  Portable exam 1957 hours compared to 02/03/2016 FINDINGS: Enlargement of cardiac silhouette. Atherosclerotic calcification aorta. Mediastinal contours and pulmonary vascularity normal for slight degree of rotation to the RIGHT. Minimal atelectasis at RIGHT base. Atelectasis versus consolidation RIGHT lower lobe. External pacing leads project over chest. Upper lungs clear. No gross pleural effusion or pneumothorax. Bones  demineralized with probable BILATERAL chronic rotator cuff tears. IMPRESSION: Enlargement of cardiac silhouette. Minimal RIGHT basilar atelectasis with atelectasis versus consolidation LEFT lower lobe. Electronically Signed   By: Lavonia Dana M.D.   On: 06/06/2016 20:33     LOS: 2 days   Oren Binet, MD  [facility] Pager:[phone]  If 7PM-7AM, please contact night-coverage [email or web address] Password [email or web address] 06/08/2016, 10:02 AM

## 2016-06-08 NOTE — Progress Notes (Signed)
Palliative Medicine RN Note: Rec'd a call back from great nephew. He is NOT primary contact; primary contact is pt's brother Audrie Kuri [phone]. Dr Hilma Favors will have conference call tomorrow at [place] with both Pilar Plate and Anthony.  Marjie Skiff Clayborn Milnes, RN, BSN, [facility] 06/08/2016 11:49 AM Cell [phone] 8:00-4:00 Monday-Friday Office [phone]

## 2016-06-08 NOTE — Progress Notes (Signed)
[facility] ROUNDING NOTE   Subjective:   Interval History: frail elderly lady with unna boots from chronic lower extremity swelling using oxygen Atrial fibrillation and bradycardia and hypotension   Renal function stable   Objective:  Vital signs in last 24 hours:  Temp:  [97.3 F (36.3 C)-98.1 F (36.7 C)] 98.1 F (36.7 C) (10/19 0400) Pulse Rate:  [47-59] 57 (10/19 0600) Resp:  [13-21] 14 (10/19 0600) BP: (72-126)/(39-94) 103/51 (10/19 0600) SpO2:  [83 %-100 %] 97 % (10/19 0600) Weight:  [55.4 kg (122 lb 2.2 oz)] 55.4 kg (122 lb 2.2 oz) (10/19 0500)  Weight change: 3.236 kg (7 lb 2.2 oz) Filed Weights   06/06/16 1926 06/08/16 0500  Weight: 52.2 kg (115 lb) 55.4 kg (122 lb 2.2 oz)    Intake/Output: I/O last 3 completed shifts: In: 2589.8 [P.O.:905; I.V.:1184.8; IV Piggyback:500] Out: 975 [Urine:975]   Intake/Output this shift:  No intake/output data recorded.  General: elderly , pleasant,  HEENT: PERRLA, EOMI Neck: no JVD Heart: brady Lungs: poor effort, mostly clear using oxygen  Abdomen: soft, non tender Extremities: pitting edema throughout legs- has UNA boots, foul smelling odor Skin: warm and dry   Basic Metabolic Panel:  Recent Labs Lab 06/06/16 1926 06/06/16 1930 06/07/16 0301 06/08/16 0328  NA 137 136 139 141  K 8.2* >7.5* 5.5* 5.0  CL 109 107 104 105  CO2  --  16* 24 30  GLUCOSE 102* 105* 119* 88  BUN 93* 85* 78* 63*  CREATININE 5.20* 5.28* 4.66* 3.93*  CALCIUM  --  9.1 10.0 8.8*  PHOS  --   --   --  5.1*    Liver Function Tests:  Recent Labs Lab 06/06/16 1930 06/08/16 0328  AST 76*  --   ALT 75*  --   ALKPHOS 98  --   BILITOT 1.1  --   PROT 5.5*  --   ALBUMIN 3.2* 2.7*   No results for input(s): LIPASE, AMYLASE in the last 168 hours. No results for input(s): AMMONIA in the last 168 hours.  CBC:  Recent Labs Lab 06/05/16 1449 06/06/16 1926 06/06/16 1930  WBC  --   --  8.6  NEUTROABS  --   --  7.3  HGB  9.2* 9.9* 8.9*  HCT  --  29.0* 29.7*  MCV  --   --  98.3  PLT  --   --  275    Cardiac Enzymes: No results for input(s): CKTOTAL, CKMB, CKMBINDEX, TROPONINI in the last 168 hours.  BNP: Invalid input(s): POCBNP  CBG:  Recent Labs Lab 06/06/16 1931  GLUCAP 101*    Microbiology: Results for orders placed or performed during the hospital encounter of 06/06/16  MRSA PCR Screening     Status: None   Collection Time: 06/07/16  2:48 AM  Result Value Ref Range Status   MRSA by PCR NEGATIVE NEGATIVE Final    Comment:        The GeneXpert MRSA Assay (FDA approved for NASAL specimens only), is one component of a comprehensive MRSA colonization surveillance program. It is not intended to diagnose MRSA infection nor to guide or monitor treatment for MRSA infections.     Coagulation Studies: No results for input(s): LABPROT, INR in the last 72 hours.  Urinalysis: No results for input(s): COLORURINE, LABSPEC, PHURINE, GLUCOSEU, HGBUR, BILIRUBINUR, KETONESUR, PROTEINUR, UROBILINOGEN, NITRITE, LEUKOCYTESUR in the last 72 hours.  Invalid input(s): APPERANCEUR    Imaging: Dg Chest Portable 1 View  Result  Date: 06/06/2016 CLINICAL DATA:  Found on floor at home confused crawling around, history COPD, renal failure, hypertension EXAM: PORTABLE CHEST 1 VIEW COMPARISON:  Portable exam 1957 hours compared to 02/03/2016 FINDINGS: Enlargement of cardiac silhouette. Atherosclerotic calcification aorta. Mediastinal contours and pulmonary vascularity normal for slight degree of rotation to the RIGHT. Minimal atelectasis at RIGHT base. Atelectasis versus consolidation RIGHT lower lobe. External pacing leads project over chest. Upper lungs clear. No gross pleural effusion or pneumothorax. Bones demineralized with probable BILATERAL chronic rotator cuff tears. IMPRESSION: Enlargement of cardiac silhouette. Minimal RIGHT basilar atelectasis with atelectasis versus consolidation LEFT lower lobe.  Electronically Signed   By: Lavonia Dana M.D.   On: 06/06/2016 20:33     Medications:   . albuterol Stopped (06/06/16 2150)   . darbepoetin (ARANESP) injection - NON-DIALYSIS  100 mcg Subcutaneous Q Wed-1800  . heparin  5,000 Units Subcutaneous Q8H  . mometasone-formoterol  2 puff Inhalation BID   acetaminophen, albuterol, ALPRAZolam, ondansetron (ZOFRAN) IV  Assessment/ Plan:  Assessment/Plan: 82 year old WF with CHF- EF 30%, progressive CKD felt not to be a candidate for dialysis presenting with altered MS- AKI on CKD as well as hyperkalemia causing bradycardia, now resoloved  1.Renal- known CKD that is fairly advanced.    determined to not be a candidate for dialysis and pt agrees. 2; Hyperkalemia- Resolved  3. Hypertension/volume- massively volume overloaded but BP is soft- diuresis will be difficult due to hypotension 4. Anemia- Will evaluate for aranesp check iron levels   Rounded with Dr Sloan Leiter will sign off on patient today     LOS: 2 Kylee Umana W @TODAY @7 :59 AM

## 2016-06-09 LAB — BASIC METABOLIC PANEL
Anion gap: 6 (ref 5–15)
BUN: 46 mg/dL — AB (ref 6–20)
CHLORIDE: 106 mmol/L (ref 101–111)
CO2: 28 mmol/L (ref 22–32)
Calcium: 8.6 mg/dL — ABNORMAL LOW (ref 8.9–10.3)
Creatinine, Ser: 3.03 mg/dL — ABNORMAL HIGH (ref 0.44–1.00)
GFR calc Af Amer: 15 mL/min — ABNORMAL LOW (ref >60)
GFR calc non Af Amer: 13 mL/min — ABNORMAL LOW (ref >60)
GLUCOSE: 89 mg/dL (ref 65–99)
POTASSIUM: 4.1 mmol/L (ref 3.5–5.1)
SODIUM: 140 mmol/L (ref 135–145)

## 2016-06-09 MED ORDER — ALPRAZOLAM 0.25 MG PO TABS
0.2500 mg | ORAL_TABLET | ORAL | Status: AC
Start: 2016-06-09 — End: 2016-06-09
  Administered 2016-06-09: 0.25 mg via ORAL
  Filled 2016-06-09: qty 1

## 2016-06-09 MED ORDER — ALPRAZOLAM 0.25 MG PO TABS
0.2500 mg | ORAL_TABLET | Freq: Every day | ORAL | Status: DC
  Administered 2016-06-09 – 2016-06-11 (×3): 0.25 mg via ORAL
  Filled 2016-06-09 (×3): qty 1

## 2016-06-09 NOTE — Consult Note (Addendum)
WOC follow-up: Refer to previous progress notes on 10/18.  3 layer Profore compression wraps and Aquacel changed to bilat legs; appearance unchanged from previous assessment.  Plan to change on Tuesday if patient is still in the hospital at that time. Pt can follow-up with the outpatient wound care center after discharge. Julien Girt MSN, RN, [place], [place], [place]

## 2016-06-09 NOTE — Plan of Care (Signed)
Problem: Education: Goal: Knowledge of [facility] Education information/materials will improve Outcome: Progressing Discussed with patient about sedative as needed medications with her low heart rate and soft blood pressures especially during her sleep with some teach back displayed

## 2016-06-09 NOTE — Telephone Encounter (Signed)
Linda with [facility] called... She is concerned with her being on apixaban (ELIQUIS) 2.5 MG TABS tablet because if she falls and hits her head. She also wants to know if she can get a Education officer, museum evaluation? Please advise thanks.

## 2016-06-09 NOTE — Care Management Important Message (Signed)
Important Message  Patient Details  Name: Mia Contreras MRN: [id number] Date of Birth: 07/25/1929   Medicare Important Message Given:  Yes    Nathen May 06/09/2016, 12:37 PM

## 2016-06-09 NOTE — Clinical Social Work Note (Signed)
Clinical Social Work Assessment  Patient Details  Name: Mia Contreras MRN: [id number] Date of Birth: [birth year]  Date of referral:  06/09/16               Reason for consult:  Facility Placement                Permission sought to share information with:  Chartered certified accountant granted to share information::  Yes, Verbal Permission Granted  Name::     Geologist, engineering::  SNF  Relationship::  Designer, fashion/clothing Information:     Housing/Transportation Living arrangements for the past 2 months:  [place] of Information:  Patient Patient Interpreter Needed:  None Criminal Activity/Legal Involvement Pertinent to Current Situation/Hospitalization:  No - Comment as needed Significant Relationships:  Adult Children, Neighbor Lives with:  Self Do you feel safe going back to the place where you live?  No Need for family participation in patient care:  Yes (Comment) (relies on neighbor for assistance at home/errands)  Care giving concerns:  Pt lives at home alone and relies heavily on neighbor for assistance.     Social Worker assessment / plan:  CSW came in to talk with pt concerning PT recommendation for SNF while Palliative team still in the room.  Palliative MD updated CSW that pt might benefit from SNF pending further PT eval to see if pt is safe to participate with current medical issues or if pt might be more appropriate for hospice program.  Employment status:  Retired Insurance underwriter information:  Medicare PT Recommendations:  [place] / Referral to community resources:  [place]  Patient/Family's Response to care:  Pt open to SNF stay at this time- prefers [place], [place], or [facility].  Patient/Family's Understanding of and Emotional Response to Diagnosis, Current Treatment, and Prognosis: Pt was resistant to idea of SNF at first but better understands her prognosis/limitations after discussion with  palliative team and is accepting of her need for additional help.  Emotional Assessment Appearance:  Appears stated age Attitude/Demeanor/Rapport:    Affect (typically observed):  Accepting, Appropriate Orientation:  Oriented to Situation, Oriented to  Time, Oriented to Place, Oriented to Self Alcohol / Substance use:  Not Applicable Psych involvement (Current and /or in the community):  No (Comment)  Discharge Needs  Concerns to be addressed:  Care Coordination Readmission within the last 30 days:  No Current discharge risk:  Physical Impairment Barriers to Discharge:  Continued Medical Work up   Jorge Ny, LCSW 06/09/2016, 2:30 PM

## 2016-06-09 NOTE — Progress Notes (Signed)
PROGRESS NOTE        PATIENT DETAILS Name: Mia Contreras Age: 82 y.o. Sex: female Date of Birth: [birth year] Admit Date: 06/06/2016 Admitting Physician Etta Quill, DO TFT:DDUK Plotnikov, MD  Brief Narrative: Patient is a 82 y.o. female systolic heart failure, chronic kidney disease stage IV, atrial fibrillation on anticoagulation presented to the ED with weakness, confusion and shortness of breath. She was found to have severe hyperkalemia, acute on chronic kidney disease and severe bradycardia. She was given IV insulin, IV calcium gluconate and placed on a bicarbonate infusion and admitted to the hospitalist service. She was seen in consult by nephrology and felt not to be a great candidate for long-term dialysis. See below for further details  Subjective: Awake and alert -looks very frail. BP still soft, still bradycardic  Denies chest pain Denies shortness of breath Denies nausea or vomiting  Assessment/Plan: Principal Problem: Hyperkalemia: Multifactorial etiology-secondary to acute on chronic renal failure, and Aldactone. Potassium has now normalized with IV bicarbonate infusion (now discontinued), she also received IV insulin in the emergency room. Aldactone remains on hold. Plans are to follow electrolytes and continue supportive care.   Active Problems: Acute on chronic kidney disease stage IV: ARF thought to be hemodynamically mediated-probably secondary to Aldactone, cardiorenal syndrome. Renal function improving with just supportive care. Evaluated by nephrology, felt not to be a candidate for dialysis. Recommendations are to continue with supportive care, given very poor overall prognoses-and potential for further deterioration-nephrology recommends initiation of palliative measures. Awaiting further recommendations by palliative care team.  Acute on chronic systolic heart failure: Appears to be volume overloaded, blood pressure is very soft and  unable to use diuretics at this time. Patient is not amenable to aggressive care-given a very poor prognoses-we will defer cardiology evaluation for inotropes/pressors at this time. Await further recommendations at the palliative care team. Suspect best to transition to hospice/comfort measures.  Hypertension: Blood pressure soft, continue to hold all antihypertensives.See above  Anemia: Probably secondary to chronic kidney disease, hemoglobin currently stable-no need for PRBC transfusion. No evidence of any overt blood loss.  History of Atrial fibrillation: Rate controlled-currently off the beta blockers/amiodarone and Eliquis (on hold due to ARF). Spoke with patient at bedside, given frail clinical status-significant chance of fall risk-she is agreeable to permanently discontinue anticoagulation at this time. Subsequently spoke with her niece over the phone, was agreeable as well. Family is aware of this difficult situation and the risk of cardiac embolic phenomenon including stroke. As noted above, patient does not desire aggressive care, and I suspect she will best benefit from initiation of palliative measures.  History of lower extremity wounds: Chronic issue-present prior to admission-has UNNA boots bilaterally-wound care evaluation appreciated.  Hx breast cancer s/p chemo 2000  Palliative care/goals of care: Unfortunate elderly female with chronic systolic heart failure and stage IV chronic kidney disease-admitted with severe hyperkalemia and worsening renal function. Evaluated by nephrology, and not a dialysis candidate. She is very frail and does not desire aggressive care at this time. She is aware of poor overall prognoses, DO NOT RESUSCITATE in place. She,however would like to go home with home health services-but does realize that she may need to be placed in a SNF. If she deteriorates further she would benefit from residential hospice. Long discussion with the patient at bedside with  nephrology and myself today,  subsequently discussed with her niece over the phone. Awaiting further recommendations from palliative care     DVT Prophylaxis: Prophylactic Heparin   Code Status:  DNR  Family Communication: None at bedside  Disposition Plan: Remain inpatient- SNF vs residential hospice on discharge.  Antimicrobial agents: None  Procedures: None  CONSULTS:  pulmonary/intensive care and nephrology   Palliative care  Time spent: 25 minutes-Greater than 50% of this time was spent in counseling, explanation of diagnosis, planning of further management, and coordination of care.  MEDICATIONS: Anti-infectives    None      Scheduled Meds: . darbepoetin (ARANESP) injection - NON-DIALYSIS  100 mcg Subcutaneous Q Wed-1800  . ferric gluconate (FERRLECIT/NULECIT) IV  250 mg Intravenous Daily  . heparin  5,000 Units Subcutaneous Q8H  . mometasone-formoterol  2 puff Inhalation BID   Continuous Infusions: . albuterol Stopped (06/06/16 2150)   PRN Meds:.acetaminophen, albuterol, ALPRAZolam, ondansetron (ZOFRAN) IV   PHYSICAL EXAM: Vital signs: Vitals:   06/09/16 0600 06/09/16 0630 06/09/16 0700 06/09/16 0800  BP: (!) 98/43 (!) 96/47 (!) 89/44 (!) 99/48  Pulse: (!) 41 (!) 41 (!) 41 (!) 44  Resp: (!) 22 13 13 15   Temp:    98.2 F (36.8 C)  TempSrc:    Oral  SpO2: 96% 97% 94% 98%  Weight:      Height:       Filed Weights   06/06/16 1926 06/08/16 0500 06/09/16 0211  Weight: 52.2 kg (115 lb) 55.4 kg (122 lb 2.2 oz) 56.6 kg (124 lb 12.5 oz)   Body mass index is 21.42 kg/m.   General appearance :Chronically ill-appearing frail elderly female Awake, alert, not in any distress. Speech Clear-but slow.  Eyes:, pupils equally reactive to light and accomodation,no scleral icterus.Pink conjunctiva HEENT: Atraumatic and Normocephalic Neck: supple, +JVD. No cervical lymphadenopathy. No thyromegaly Resp:Good air entry bilaterally, no added sounds  CVS: S1 S2  irregular, +syst murmur GI: Bowel sounds present, Non tender and not distended with no gaurding, rigidity or rebound.No organomegaly Extremities: B/L Lower Ext wrapped with UNNA boots Neurology:  speech clear,Non focal-but with generalized weakness, sensation is grossly intact. Psychiatric: Normal judgment and insight. Alert and oriented x 3.  Musculoskeletal:No digital cyanosis Skin:No Rash, warm and dry  I have personally reviewed following labs and imaging studies  LABORATORY DATA: CBC:  Recent Labs Lab 06/05/16 1449 06/06/16 1926 06/06/16 1930  WBC  --   --  8.6  NEUTROABS  --   --  7.3  HGB 9.2* 9.9* 8.9*  HCT  --  29.0* 29.7*  MCV  --   --  98.3  PLT  --   --  315    Basic Metabolic Panel:  Recent Labs Lab 06/06/16 1926 06/06/16 1930 06/07/16 0301 06/08/16 0328 06/09/16 0442  NA 137 136 139 141 140  K 8.2* >7.5* 5.5* 5.0 4.1  CL 109 107 104 105 106  CO2  --  16* 24 30 28   GLUCOSE 102* 105* 119* 88 89  BUN 93* 85* 78* 63* 46*  CREATININE 5.20* 5.28* 4.66* 3.93* 3.03*  CALCIUM  --  9.1 10.0 8.8* 8.6*  PHOS  --   --   --  5.1*  --     GFR: Estimated Creatinine Clearance: 11.9 mL/min (by C-G formula based on SCr of 3.03 mg/dL (H)).  Liver Function Tests:  Recent Labs Lab 06/06/16 1930 06/08/16 0328  AST 76*  --   ALT 75*  --   ALKPHOS 98  --  BILITOT 1.1  --   PROT 5.5*  --   ALBUMIN 3.2* 2.7*   No results for input(s): LIPASE, AMYLASE in the last 168 hours. No results for input(s): AMMONIA in the last 168 hours.  Coagulation Profile: No results for input(s): INR, PROTIME in the last 168 hours.  Cardiac Enzymes: No results for input(s): CKTOTAL, CKMB, CKMBINDEX, TROPONINI in the last 168 hours.  BNP (last 3 results)  Recent Labs  12/01/15 1052  PROBNP 1,090.0*    HbA1C: No results for input(s): HGBA1C in the last 72 hours.  CBG:  Recent Labs Lab 06/06/16 1931  GLUCAP 101*    Lipid Profile: No results for input(s): CHOL,  HDL, LDLCALC, TRIG, CHOLHDL, LDLDIRECT in the last 72 hours.  Thyroid Function Tests: No results for input(s): TSH, T4TOTAL, FREET4, T3FREE, THYROIDAB in the last 72 hours.  Anemia Panel:  Recent Labs  06/07/16 1246  TIBC 340  IRON 15*    Urine analysis:    Component Value Date/Time   COLORURINE YELLOW 01/29/2016 [place] 01/29/2016 1615   LABSPEC 1.011 01/29/2016 1615   PHURINE 7.5 01/29/2016 1615   GLUCOSEU NEGATIVE 01/29/2016 1615   GLUCOSEU NEGATIVE 05/06/2015 1008   HGBUR NEGATIVE 01/29/2016 1615   BILIRUBINUR NEGATIVE 01/29/2016 1615   KETONESUR NEGATIVE 01/29/2016 1615   PROTEINUR NEGATIVE 01/29/2016 1615   UROBILINOGEN 0.2 05/06/2015 1008   NITRITE NEGATIVE 01/29/2016 1615   LEUKOCYTESUR NEGATIVE 01/29/2016 1615    Sepsis Labs: Lactic Acid, Venous    Component Value Date/Time   LATICACIDVEN 1.5 06/07/2016 6144    MICROBIOLOGY: Recent Results (from the past 240 hour(s))  MRSA PCR Screening     Status: None   Collection Time: 06/07/16  2:48 AM  Result Value Ref Range Status   MRSA by PCR NEGATIVE NEGATIVE Final    Comment:        The GeneXpert MRSA Assay (FDA approved for NASAL specimens only), is one component of a comprehensive MRSA colonization surveillance program. It is not intended to diagnose MRSA infection nor to guide or monitor treatment for MRSA infections.     RADIOLOGY STUDIES/RESULTS: Dg Chest Portable 1 View  Result Date: 06/06/2016 CLINICAL DATA:  Found on floor at home confused crawling around, history COPD, renal failure, hypertension EXAM: PORTABLE CHEST 1 VIEW COMPARISON:  Portable exam 1957 hours compared to 02/03/2016 FINDINGS: Enlargement of cardiac silhouette. Atherosclerotic calcification aorta. Mediastinal contours and pulmonary vascularity normal for slight degree of rotation to the RIGHT. Minimal atelectasis at RIGHT base. Atelectasis versus consolidation RIGHT lower lobe. External pacing leads project  over chest. Upper lungs clear. No gross pleural effusion or pneumothorax. Bones demineralized with probable BILATERAL chronic rotator cuff tears. IMPRESSION: Enlargement of cardiac silhouette. Minimal RIGHT basilar atelectasis with atelectasis versus consolidation LEFT lower lobe. Electronically Signed   By: Lavonia Dana M.D.   On: 06/06/2016 20:33     LOS: 3 days   Oren Binet, MD  [facility] Pager:[phone]  If 7PM-7AM, please contact night-coverage [email or web address] Password [email or web address] 06/09/2016, 11:30 AM

## 2016-06-09 NOTE — Plan of Care (Signed)
Spoke with Mrs. Walston's HCPOA, brother Pilar Plate. Goals are FULL COMFORT CARE. We discussed [place] am going to see if Mrs. Feng is able to participate in a conversation with her brother- she is extremely fragile and will not be able to do any kind of rehab. Multi-organ failure- only temporally stabilized but hypotension and bradycardia persist.  Further recommendations to follow- will likely head toward residential hospice.  Lane Hacker, DO Palliative Medicine

## 2016-06-09 NOTE — Progress Notes (Signed)
Patient had soft blood pressures and low heart rate 38-43 throughout the shift; paged on-call md with no new orders given at that time.

## 2016-06-09 NOTE — Progress Notes (Signed)
Detailed discussion with Mrs. Sorell this morning- she is very mentally alert, oriented and asking appropriate questions about her care and condition.  My concern is that she has progressive organ failure- heart failure, renal failure and progressive COPD already home O2 dependent prior to admission- while her mind is strong her body may not allow her to rehab. Her prognosis is overwhelmingly poor and she is fragile- also very high risk for sudden death. I would like for PT to do an evaluation while she is on the monitor and make sure she can tolerate mobility. Mrs. Christner wants to at least try to rehab but she is completely open to hospice care as a back-up safety net if she declines or is unable to participate. She does not have financial resources to pay for in home private duty caregivers- her brother actually provides a lot of financial support to her.  She is by her nature fiercely independent and has been most of her life-she considers her home her castle and the most important thing to her in her life. She never wants to be in a nursing home but has been to SNF rehab in the past for short periods of time. She has been declining at home for the past 10 months. Her brother Pilar Plate who lives in [place] is her HCPOA. She relies on a neighbor for help and support.   Current Palliative Recommendations:  1. Anxiety- she has been on alprazolam for many years twice a day- will schedule a dose QHS (.25mg ) and leave a PRN as well- conversation topic was difficult for her and she says she is "scared"- we talked about maintaining hope and the need for planning.  2. Is rehab really possible given her level of organ failure? Will she become significantly symptomatic or unstable with exertion and bradycardia? Will await PT evaluation -seems she has improved since last evaluation. I suspect in a few more days we will have a better idea of where she will "land" in terms of stabilization or decline- I doubt she will  be able to get back to her pre-hospital condition of independent living which at best was already very difficult.  3. If she does go to Rehab, will need palliative consultation and close follow- she would be high risk for re-admission- I spoke to her about avoiding the hospital and hospice transition if she declined again and she was agreeable to this plan in general.  4. I will arrange a call with her HCPOA, myself and patient to discuss next steps and best plan after we see if rehab is an option-she has been recieving extensive outpatient wound care.Multiple falls at home.   5. Patient is DNR. Avoid re-hospitalization. Treat anxiety is a major goal for her.   Time: 10-11:10AM 70 minutes Greater than 50%  of this time was spent counseling and coordinating care related to the above assessment and plan.]  Lane Hacker, DO Palliative Medicine

## 2016-06-09 NOTE — NC FL2 (Signed)
[place] MEDICAID FL2 LEVEL OF CARE SCREENING TOOL     IDENTIFICATION  Patient Name: Mia Contreras Birthdate: 1928/11/30 Sex: female Admission Date (Current Location): 06/06/2016  [facility] and [place] Number:  Herbalist and Address:  [facility] [place] [facility], [place] [address] [place], [place] [address]      Provider Number: [id number]  Attending Physician Name and Address:  Jonetta Osgood, MD  Relative Name and Phone Number:       Current Level of Care: Hospital Recommended Level of Care: [place] Prior Approval Number:    Date Approved/Denied:   PASRR Number: [id number]  Discharge Plan: SNF    Current Diagnoses: Patient Active Problem List   Diagnosis Date Noted  . Symptomatic bradycardia   . Acute systolic CHF (congestive heart failure) ([place])   . Hyperkalemia 06/06/2016  . CKD (chronic kidney disease) stage 4, GFR 15-29 ml/min (HCC) 06/06/2016  . Skin tear of left forearm without complication 41/63/8453  . HCAP (healthcare-associated pneumonia) 01/30/2016  . Chronic anticoagulation-Eliquis 01/30/2016  . Hypotension-currently asymptomatic 01/30/2016  . Malnutrition of moderate degree 01/27/2016  . Falls 01/26/2016  . Hypoxia 01/26/2016  . Acute on chronic diastolic congestive heart failure, NYHA class 4 ([place]) 01/26/2016  . Atrial fibrillation ([place]) 01/11/2016  . PAF- currently in AF on Amiodarone 200 mg BID   . Acute CHF ([place]) 12/15/2015  . AKI (acute kidney injury) ([place]) 12/14/2015  . CHF (congestive heart failure) ([place]) 12/14/2015  . Acute on chronic combined systolic and diastolic heart failure ([place]) 12/14/2015  . Acute kidney injury superimposed on chronic kidney disease ([place]) 12/14/2015  . CRF (chronic renal failure) 12/14/2015  . IBS (irritable bowel syndrome) 12/14/2015  . Open leg wound 11/08/2015  . Hiatal hernia 05/12/2015  . Subconjunctival hemorrhage 08/07/2014  . Chronic renal failure 08/07/2014  .  Nausea without vomiting 07/15/2014  . Noninfected skin tear of right leg 07/03/2014  . Hypoglycemia 12/12/2013  . Chronic systolic heart failure ([place]) 08/22/2013  . NICM (nonischemic cardiomyopathy) ([place]) 08/22/2013  . Diarrhea 10/01/2012  . Dyspnea on exertion 06/27/2012  . Asthmatic bronchitis 06/27/2012  . UTI (urinary tract infection) 11/02/2011  . Postoperative anemia due to acute blood loss 11/01/2011  . COPD (chronic obstructive pulmonary disease) ([place]) 08/16/2011  . Cough 08/08/2011  . Preop exam for internal medicine 08/02/2011  . Cerumen impaction 08/02/2011  . Bruit 04/19/2011  . Arrhythmia 03/29/2011  . Neoplasm of uncertain behavior of skin 09/21/2010  . PREMATURE ATRIAL CONTRACTIONS 09/21/2010  . CYSTITIS 09/21/2010  . Thyrotoxicosis 05/23/2010  . GOITER, MULTINODULAR 05/06/2010  . TOBACCO USE, QUIT 06/14/2009  . ELECTROCARDIOGRAM, ABNORMAL 04/02/2009  . SKIN CANCER, HX OF 11/12/2008  . CARPAL TUNNEL SYNDROME, RIGHT 01/11/2008  . SCOLIOSIS 01/11/2008  . VENOUS INSUFFICIENCY 09/05/2007  . Edema 09/05/2007  . Anemia in chronic kidney disease 08/20/2007  . DIVERTICULOSIS, COLON 08/20/2007  . CELLULITIS, LEG, RIGHT 08/20/2007  . OSTEOPOROSIS 08/20/2007  . Open wound of knee, leg, and ankle 07/03/2007  . Anxiety state 03/16/2007  . Essential hypertension 03/16/2007  . Osteoarthritis 03/16/2007  . hx: breast cancer, left, infiltrating ductal 03/16/2007    Orientation RESPIRATION BLADDER Height & Weight     Self, Time, Situation, Place  O2 (1L [place]) Continent Weight: 124 lb 12.5 oz (56.6 kg) Height:  5\' 4"  (646.8 cm)  BEHAVIORAL SYMPTOMS/MOOD NEUROLOGICAL BOWEL NUTRITION STATUS      Continent Diet (cardiac)  AMBULATORY STATUS COMMUNICATION OF NEEDS Skin   Extensive  Assist Verbally Surgical wounds, Other (Comment) (bilateral leg wounds)                       Personal Care Assistance Level of Assistance  Bathing, Dressing Bathing Assistance: Maximum  assistance   Dressing Assistance: Maximum assistance     Functional Limitations Info             [place]  PT (By licensed PT), OT (By licensed OT)     PT Frequency: 5/wk OT Frequency: 5/wk            Contractures      Additional Factors Info  Code Status, Allergies Code Status Info: DNR Allergies Info: Cefuroxime, Celecoxib, Deltasone Prednisone, Ranitidine, Tramadol Hcl           Current Medications (06/09/2016):  This is the current hospital active medication list Current Facility-Administered Medications  Medication Dose Route Frequency Provider Last Rate Last Dose  . acetaminophen (TYLENOL) tablet 650 mg  650 mg Oral Q6H PRN Jonetta Osgood, MD   650 mg at 06/08/16 1252  . albuterol (PROVENTIL) (2.5 MG/3ML) 0.083% nebulizer solution 2.5 mg  2.5 mg Nebulization Q2H PRN Jonetta Osgood, MD      . albuterol (PROVENTIL,VENTOLIN) solution continuous neb  10 mg/hr Nebulization Continuous Merrily Pew, MD   Stopped at 06/06/16 2150  . ALPRAZolam Duanne Moron) tablet 0.5 mg  0.5 mg Oral BID PRN Etta Quill, DO   0.5 mg at 06/08/16 1252  . Darbepoetin Alfa (ARANESP) injection 100 mcg  100 mcg Subcutaneous Q Wed-1800 Edrick Oh, MD   100 mcg at 06/07/16 2128  . ferric gluconate (NULECIT) 250 mg in sodium chloride 0.9 % 100 mL IVPB  250 mg Intravenous Daily Edrick Oh, MD   250 mg at 06/09/16 1141  . heparin injection 5,000 Units  5,000 Units Subcutaneous Q8H Etta Quill, DO   5,000 Units at 06/09/16 0546  . mometasone-formoterol (DULERA) 200-5 MCG/ACT inhaler 2 puff  2 puff Inhalation BID Etta Quill, DO   2 puff at 06/08/16 1951  . ondansetron (ZOFRAN) injection 4 mg  4 mg Intravenous Q6H PRN Shanker Kristeen Mans, MD         Discharge Medications: Please see discharge summary for a list of discharge medications.  Relevant Imaging Results:  Relevant Lab Results:   Additional Information SS#: [id number]  Jorge Ny, LCSW

## 2016-06-09 NOTE — Telephone Encounter (Signed)
OK. Thx

## 2016-06-09 NOTE — Progress Notes (Signed)
Physical Therapy Treatment Patient Details Name: PRISMA DECARLO MRN: [id number] DOB: December 03, 1928 Today's Date: 06/09/2016    History of Present Illness Patient is a 82 y.o. female systolic heart failure, chronic kidney disease stage IV, atrial fibrillation on anticoagulation presented to the ED with weakness, confusion and shortness of breath. She was found to have severe hyperkalemia, acute on chronic kidney disease and severe bradycardia. She was given IV insulin, IV calcium gluconate and placed on a bicarbonate infusion and admitted to the hospitalist service. She was seen in consult by nephrology and felt not to be a great candidate for long-term dialysis.    PT Comments    Pt admitted with above diagnosis. Pt currently with functional limitations due to balance and endurance deficits. Pt ambulated with RW with PT however pt with very poor gait pattern.  Pt ataxic as well as scissoring and would have fallen if PT wasn't holding onto her.  Pt states that she has had balance issues for some time and friend Franchot Mimes in room states he began to help her in the home because he saw her struggle with her balance.  Talked at length with pt regarding Rehab at SNF to get stronger and that most likely she will have to use wheelchair at home to be safe. She could get a hospital bed in her living room and rehab can teach her how to function from wheelchair but she still needs to do Rehab and she can tolerate it as her VS were stable today.  Continue to recommend Rehab at SNF with transition to home with Hospice care once she is Independent again.   Pt will benefit from skilled PT to increase their independence and safety with mobility to allow discharge to the venue listed below.    Follow Up Recommendations  SNF;Supervision/Assistance - 24 hour     Equipment Recommendations  None recommended by PT    Recommendations for Other Services       Precautions / Restrictions Precautions Precautions:  Fall Restrictions Weight Bearing Restrictions: No    Mobility  Bed Mobility                  Transfers Overall transfer level: Needs assistance Equipment used: Rolling walker (2 wheeled) Transfers: Sit to/from Stand Sit to Stand: Mod assist;+2 physical assistance;Max assist Stand pivot transfers: Mod assist;+2 physical assistance       General transfer comment: Needed assist for sit to stand as pt was leaning posteriorly.    stand pivot to chair as pt with significant posterior lean.  Also needed cues to move LEs around for pivot transfer.Pt urinated as she stepped to recliner.  Got 3N1 and transferred pt to 3N1 so she could finish urinating.  Then cleaned pt and she pivoted back to recliner.   Ambulation/Gait Ambulation/Gait assistance: Mod assist;+2 safety/equipment;Max assist Ambulation Distance (Feet): 60 Feet (30 feet x 2) Assistive device: Rolling walker (2 wheeled) Gait Pattern/deviations: Step-through pattern;Decreased stride length;Decreased step length - right;Decreased step length - left;Steppage;Ataxic;Scissoring;Leaning posteriorly;Staggering left;Staggering right;Trunk flexed;Wide base of support   Gait velocity interpretation: Below normal speed for age/gender General Gait Details: Pt was able to ambulate with VSS however her gait pattern is very unsteady.  Pt would have fallen if PT was not guarding/assisting pt.  Needed assist to slow down and stay close to RW as well.      Stairs            Wheelchair Mobility    Modified Rankin (Stroke Patients Only)  Balance Overall balance assessment: Needs assistance;History of Falls       Postural control: Posterior lean Standing balance support: Bilateral upper extremity supported;During functional activity Standing balance-Leahy Scale: Poor Standing balance comment: Relies on UE support with significant posterior lean.                     Cognition Arousal/Alertness:  Awake/alert Behavior During Therapy: WFL for tasks assessed/performed Overall Cognitive Status: Within Functional Limits for tasks assessed                      Exercises General Exercises - Lower Extremity Ankle Circles/Pumps: AROM;Both;10 reps;Seated Long Arc Quad: AROM;Both;10 reps;Seated    General Comments General comments (skin integrity, edema, etc.): Bil LEs with coban wraps      Pertinent Vitals/Pain Pain Assessment: Faces Faces Pain Scale: Hurts even more Pain Location: bil LEs Pain Descriptors / Indicators: Aching;Grimacing;Guarding Pain Intervention(s): Limited activity within patient's tolerance;Monitored during session;Repositioned  O2 sats >90% on 2LO2 with ambulation.  Hr 48-62 bpm.  BP 104/47    Home Living                      Prior Function            PT Goals (current goals can now be found in the care plan section) Acute Rehab PT Goals Patient Stated Goal: to get better Progress towards PT goals: Progressing toward goals    Frequency    Min 3X/week      PT Plan Current plan remains appropriate    Co-evaluation             End of Session Equipment Utilized During Treatment: Gait belt;Oxygen Activity Tolerance: Patient limited by fatigue Patient left: in chair;with call bell/phone within reach     Time: 1856-3149 PT Time Calculation (min) (ACUTE ONLY): 49 min  Charges:  $Gait Training: 23-37 mins $Therapeutic Activity: 8-22 mins                    G CodesDenice Paradise June 12, 2016, 5:45 PM [employer] Acute Rehabilitation [phone] (pager)

## 2016-06-10 NOTE — Progress Notes (Signed)
No charge note  Received call from patient's HCPOA brother Pilar Plate from [place]. Pilar Plate states that patient and him are [employer], [place] natives, they have a close bond and if the patient is awake/alert and wishing to attempt SNF rehab with palliative follow up, he does not wish to over ride the patient's wishes. If she is confused or no longer decisional, then he is willing to consider residential hospice. He states that the patient has an intense fear of death and is scared about her condition over all. Offered active listening and supportive care over the phone. Discussed with brother Pilar Plate that hospital staff will be in touch with him with any updates or changes in Mia Contreras's condition.   25 minutes spent  [facility] palliative medicine team [phone]

## 2016-06-10 NOTE — Progress Notes (Signed)
PROGRESS NOTE        PATIENT DETAILS Name: Mia Contreras Age: 82 y.o. Sex: female Date of Birth: [birth year] Admit Date: 06/06/2016 Admitting Physician Etta Quill, DO JHE:RDEY Plotnikov, MD  Brief Narrative: Patient is a 82 y.o. female systolic heart failure, chronic kidney disease stage IV, atrial fibrillation on anticoagulation presented to the ED with weakness, confusion and shortness of breath. She was found to have severe hyperkalemia, acute on chronic kidney disease and severe bradycardia. She was given IV insulin, IV calcium gluconate and placed on a bicarbonate infusion and admitted to the hospitalist service. She was seen in consult by nephrology and felt not to be a great candidate for long-term dialysis. See below for further details  Subjective: Awake and alert -looks very frail. BP still soft, still bradycardic  Denies chest pain Denies shortness of breath Denies nausea or vomiting  Assessment/Plan: Principal Problem: Hyperkalemia: Multifactorial etiology-secondary to acute on chronic renal failure, and Aldactone. Potassium has now normalized with IV bicarbonate infusion (now discontinued), she also received IV insulin in the emergency room. Aldactone remains on hold. Plans are to follow electrolytes and continue supportive care.   Active Problems: Acute on chronic kidney disease stage IV: ARF thought to be hemodynamically mediated-probably secondary to Aldactone, cardiorenal syndrome. Renal function improving with just supportive care. Evaluated by nephrology, felt not to be a candidate for dialysis. Recommendations are to continue with supportive care, given very poor overall prognoses-and potential for further deterioration-nephrology recommends initiation of palliative measures. Appreciate input from palliative care team-awaiting further recommendations   Acute on chronic systolic heart failure: Appears to be volume overloaded, blood  pressure is very soft and unable to use diuretics at this time. Patient is not amenable to aggressive care-given a very poor prognoses-we will defer cardiology evaluation for inotropes/pressors at this time. Await further recommendations at the palliative care team. Suspect best to transition to hospice/comfort measures.  Bradycardia: persists-currently off amiodarone/Bisoprolol-as noted above-patient does not desire aggressive care. Heart rate in 40-50's and she seems to be tolerating it well.   Hypertension: Blood pressure soft, continue to hold all antihypertensives.See above  Anemia: Probably secondary to chronic kidney disease, hemoglobin currently stable-no need for PRBC transfusion. No evidence of any overt blood loss.  History of Atrial fibrillation: Rate controlled-currently off the beta blockers/amiodarone and Eliquis (on hold due to ARF). Spoke with patient at bedside, given frail clinical status-significant chance of fall risk-she is agreeable to permanently discontinue anticoagulation at this time. Subsequently spoke with her niece over the phone, was agreeable as well. Family is aware of this difficult situation and the risk of cardiac embolic phenomenon including stroke. As noted above, patient does not desire aggressive care, and I suspect she will best benefit from initiation of palliative measures.  History of lower extremity wounds: Chronic issue-present prior to admission-has UNNA boots bilaterally-wound care evaluation appreciated.  Hx breast cancer s/p chemo 2000  Palliative care/goals of care: Unfortunate elderly female with chronic systolic heart failure and stage IV chronic kidney disease-admitted with severe hyperkalemia and worsening renal function. Evaluated by nephrology, and not a dialysis candidate. She is very frail and does not desire aggressive care at this time. She is aware of poor overall prognoses, DO NOT RESUSCITATE in place. She,however would like to go home with  home health services-but does realize that she may need to  be placed in a SNF. If she deteriorates further she would benefit from residential hospice.Palliative care team now involved and discussions ongoing with patient and family members. Awaiting further recommendations from palliative care     DVT Prophylaxis: Prophylactic Heparin   Code Status:  DNR  Family Communication: None at bedside  Disposition Plan: Remain inpatient- SNF vs residential hospice on discharge.  Antimicrobial agents: None  Procedures: None  CONSULTS:  pulmonary/intensive care and nephrology   Palliative care  Time spent: 25 minutes-Greater than 50% of this time was spent in counseling, explanation of diagnosis, planning of further management, and coordination of care.  MEDICATIONS: Anti-infectives    None      Scheduled Meds: . ALPRAZolam  0.25 mg Oral QHS  . darbepoetin (ARANESP) injection - NON-DIALYSIS  100 mcg Subcutaneous Q Wed-1800  . ferric gluconate (FERRLECIT/NULECIT) IV  250 mg Intravenous Daily  . heparin  5,000 Units Subcutaneous Q8H  . mometasone-formoterol  2 puff Inhalation BID   Continuous Infusions: . albuterol Stopped (06/06/16 2150)   PRN Meds:.acetaminophen, albuterol, ALPRAZolam, ondansetron (ZOFRAN) IV   PHYSICAL EXAM: Vital signs: Vitals:   06/10/16 0100 06/10/16 0300 06/10/16 0851 06/10/16 0929  BP:   (!) 98/41   Pulse:      Resp:      Temp: 97.6 F (36.4 C) 98.3 F (36.8 C) 97.1 F (36.2 C)   TempSrc: Oral Oral Axillary   SpO2:    92%  Weight: 56.5 kg (124 lb 9 oz)     Height:       Filed Weights   06/08/16 0500 06/09/16 0211 06/10/16 0100  Weight: 55.4 kg (122 lb 2.2 oz) 56.6 kg (124 lb 12.5 oz) 56.5 kg (124 lb 9 oz)   Body mass index is 21.38 kg/m.   General appearance :Chronically ill-appearing frail elderly female Awake, alert, not in any distress. Speech Clear-but slow.  Eyes:, pupils equally reactive to light and accomodation,no scleral  icterus.Pink conjunctiva HEENT: Atraumatic and Normocephalic Neck: supple, +JVD. No cervical lymphadenopathy. No thyromegaly Resp:Good air entry bilaterally, no added sounds  CVS: S1 S2 irregular, +syst murmur GI: Bowel sounds present, Non tender and not distended with no gaurding, rigidity or rebound.No organomegaly Extremities: B/L Lower Ext wrapped with UNNA boots Neurology:  speech clear,Non focal-but with generalized weakness, sensation is grossly intact. Psychiatric: Normal judgment and insight. Alert and oriented x 3.  Musculoskeletal:No digital cyanosis Skin:No Rash, warm and dry  I have personally reviewed following labs and imaging studies  LABORATORY DATA: CBC:  Recent Labs Lab 06/05/16 1449 06/06/16 1926 06/06/16 1930  WBC  --   --  8.6  NEUTROABS  --   --  7.3  HGB 9.2* 9.9* 8.9*  HCT  --  29.0* 29.7*  MCV  --   --  98.3  PLT  --   --  505    Basic Metabolic Panel:  Recent Labs Lab 06/06/16 1926 06/06/16 1930 06/07/16 0301 06/08/16 0328 06/09/16 0442  NA 137 136 139 141 140  K 8.2* >7.5* 5.5* 5.0 4.1  CL 109 107 104 105 106  CO2  --  16* 24 30 28   GLUCOSE 102* 105* 119* 88 89  BUN 93* 85* 78* 63* 46*  CREATININE 5.20* 5.28* 4.66* 3.93* 3.03*  CALCIUM  --  9.1 10.0 8.8* 8.6*  PHOS  --   --   --  5.1*  --     GFR: Estimated Creatinine Clearance: 11.9 mL/min (by C-G formula based on SCr  of 3.03 mg/dL (H)).  Liver Function Tests:  Recent Labs Lab 06/06/16 1930 06/08/16 0328  AST 76*  --   ALT 75*  --   ALKPHOS 98  --   BILITOT 1.1  --   PROT 5.5*  --   ALBUMIN 3.2* 2.7*   No results for input(s): LIPASE, AMYLASE in the last 168 hours. No results for input(s): AMMONIA in the last 168 hours.  Coagulation Profile: No results for input(s): INR, PROTIME in the last 168 hours.  Cardiac Enzymes: No results for input(s): CKTOTAL, CKMB, CKMBINDEX, TROPONINI in the last 168 hours.  BNP (last 3 results)  Recent Labs  12/01/15 1052  PROBNP  1,090.0*    HbA1C: No results for input(s): HGBA1C in the last 72 hours.  CBG:  Recent Labs Lab 06/06/16 1931  GLUCAP 101*    Lipid Profile: No results for input(s): CHOL, HDL, LDLCALC, TRIG, CHOLHDL, LDLDIRECT in the last 72 hours.  Thyroid Function Tests: No results for input(s): TSH, T4TOTAL, FREET4, T3FREE, THYROIDAB in the last 72 hours.  Anemia Panel:  Recent Labs  06/07/16 1246  TIBC 340  IRON 15*    Urine analysis:    Component Value Date/Time   COLORURINE YELLOW 01/29/2016 [place] 01/29/2016 1615   LABSPEC 1.011 01/29/2016 1615   PHURINE 7.5 01/29/2016 1615   GLUCOSEU NEGATIVE 01/29/2016 1615   GLUCOSEU NEGATIVE 05/06/2015 1008   HGBUR NEGATIVE 01/29/2016 1615   BILIRUBINUR NEGATIVE 01/29/2016 1615   KETONESUR NEGATIVE 01/29/2016 1615   PROTEINUR NEGATIVE 01/29/2016 1615   UROBILINOGEN 0.2 05/06/2015 1008   NITRITE NEGATIVE 01/29/2016 1615   LEUKOCYTESUR NEGATIVE 01/29/2016 1615    Sepsis Labs: Lactic Acid, Venous    Component Value Date/Time   LATICACIDVEN 1.5 06/07/2016 8250    MICROBIOLOGY: Recent Results (from the past 240 hour(s))  MRSA PCR Screening     Status: None   Collection Time: 06/07/16  2:48 AM  Result Value Ref Range Status   MRSA by PCR NEGATIVE NEGATIVE Final    Comment:        The GeneXpert MRSA Assay (FDA approved for NASAL specimens only), is one component of a comprehensive MRSA colonization surveillance program. It is not intended to diagnose MRSA infection nor to guide or monitor treatment for MRSA infections.     RADIOLOGY STUDIES/RESULTS: Dg Chest Portable 1 View  Result Date: 06/06/2016 CLINICAL DATA:  Found on floor at home confused crawling around, history COPD, renal failure, hypertension EXAM: PORTABLE CHEST 1 VIEW COMPARISON:  Portable exam 1957 hours compared to 02/03/2016 FINDINGS: Enlargement of cardiac silhouette. Atherosclerotic calcification aorta. Mediastinal contours and  pulmonary vascularity normal for slight degree of rotation to the RIGHT. Minimal atelectasis at RIGHT base. Atelectasis versus consolidation RIGHT lower lobe. External pacing leads project over chest. Upper lungs clear. No gross pleural effusion or pneumothorax. Bones demineralized with probable BILATERAL chronic rotator cuff tears. IMPRESSION: Enlargement of cardiac silhouette. Minimal RIGHT basilar atelectasis with atelectasis versus consolidation LEFT lower lobe. Electronically Signed   By: Lavonia Dana M.D.   On: 06/06/2016 20:33     LOS: 4 days   Oren Binet, MD  [facility] Pager:[phone]  If 7PM-7AM, please contact night-coverage [email or web address] Password [email or web address] 06/10/2016, 10:07 AM

## 2016-06-10 NOTE — Progress Notes (Signed)
CSW met with pt @ bedside to discuss bed offers. Pt had already decided she wants to go back to [facility] where she has hx. CSW will initiate transfer to facility once pt is medicall cleared.  Kebrina Friend B. Owens Shark, LCSWA

## 2016-06-11 LAB — BASIC METABOLIC PANEL
Anion gap: 6 (ref 5–15)
BUN: 35 mg/dL — AB (ref 6–20)
CO2: 29 mmol/L (ref 22–32)
CREATININE: 2.24 mg/dL — AB (ref 0.44–1.00)
Calcium: 8.5 mg/dL — ABNORMAL LOW (ref 8.9–10.3)
Chloride: 104 mmol/L (ref 101–111)
GFR calc Af Amer: 22 mL/min — ABNORMAL LOW (ref >60)
GFR, EST NON AFRICAN AMERICAN: 19 mL/min — AB (ref >60)
Glucose, Bld: 106 mg/dL — ABNORMAL HIGH (ref 65–99)
POTASSIUM: 4.4 mmol/L (ref 3.5–5.1)
SODIUM: 139 mmol/L (ref 135–145)

## 2016-06-11 MED ORDER — TORSEMIDE 20 MG PO TABS
40.0000 mg | ORAL_TABLET | Freq: Every day | ORAL | Status: DC
  Administered 2016-06-11 – 2016-06-12 (×2): 40 mg via ORAL
  Filled 2016-06-11 (×4): qty 2

## 2016-06-11 NOTE — Progress Notes (Signed)
PROGRESS NOTE        PATIENT DETAILS Name: Mia Contreras Age: 82 y.o. Sex: female Date of Birth: [birth year] Admit Date: 06/06/2016 Admitting Physician Etta Quill, DO GNO:IBBC Plotnikov, MD  Brief Narrative: Patient is a 82 y.o. female systolic heart failure, chronic kidney disease stage IV, atrial fibrillation on anticoagulation presented to the ED with weakness, confusion and shortness of breath. She was found to have severe hyperkalemia, acute on chronic kidney disease and severe bradycardia. She was given IV insulin, IV calcium gluconate and placed on a bicarbonate infusion and admitted to the hospitalist service. She was seen in consult by nephrology and felt not to be a great candidate for long-term dialysis. See below for further details  Subjective: Awake and alert -looks very frail. BP still soft, still bradycardic-she is worried about her weight gain. Wants to be restarted on diuretics.  Denies chest pain Denies shortness of breath Denies nausea or vomiting  Assessment/Plan: Principal Problem: Hyperkalemia: Multifactorial etiology-secondary to acute on chronic renal failure, and Aldactone. Potassium has now normalized with IV bicarbonate infusion (now discontinued), she also received IV insulin in the emergency room. Aldactone remains on hold. Plans are to follow electrolytes and continue supportive care.   Active Problems: Acute on chronic kidney disease stage IV: ARF thought to be hemodynamically mediated-probably secondary to Aldactone, cardiorenal syndrome. Renal function improving with just supportive care. Evaluated by nephrology, felt not to be a candidate for dialysis. Recommendations are to continue with supportive care, given very poor overall prognoses-and potential for further deterioration-nephrology recommends initiation of palliative measures. Appreciate input from palliative care team-awaiting further recommendations   Acute on  chronic systolic heart failure: Appears to be volume overloaded with increasing weight-although blood pressure still soft-we will go ahead and start torsemide. All diuretics were held on admission due to worsening renal function and hyperkalemia.,Patient is not amenable to aggressive care-given a very poor prognoses-we will defer cardiology evaluation for inotropes/pressors at this time. Being followed by the palliative care team, although inpatient/residential hospice was initially contemplated, patient is keen on going back to SNF with hospice/palliative care follow-up. We will need to further delineate goals of care with the palliative care team prior to discharge.   Bradycardia: persists-currently off amiodarone/Bisoprolol-as noted above-patient does not desire aggressive care. Heart rate in 40-50's and she seems to be tolerating it well.   Hypertension: Blood pressure soft, continue to hold all antihypertensives.See above  Anemia: Probably secondary to chronic kidney disease, hemoglobin currently stable-no need for PRBC transfusion. No evidence of any overt blood loss.  History of Atrial fibrillation: Rate controlled-bradycardic in the 40s and 50s--currently off the beta blockers/amiodarone and Eliquis (on hold due to ARF). Spoke with patient at bedside, given frail clinical status-significant chance of fall risk-she is agreeable to permanently discontinue anticoagulation at this time. Subsequently spoke with her niece over the phone, was agreeable as well. Family is aware of this difficult situation and the risk of cardiac embolic phenomenon including stroke. As noted above, patient does not desire aggressive care, and I suspect she will best benefit from initiation of palliative measures.  History of lower extremity wounds: Chronic issue-present prior to admission-has UNNA boots bilaterally-wound care evaluation appreciated.  Hx breast cancer s/p chemo 2000  Palliative care/goals of care:  Unfortunate elderly female with chronic systolic heart failure and stage IV chronic kidney disease-admitted  with severe hyperkalemia and worsening renal function. Evaluated by nephrology, and not a dialysis candidate. She is very frail and does not desire aggressive care at this time. She is aware of poor overall prognoses, DO NOT RESUSCITATE in place. She,however would like to go home with home health services-but does realize that she may need to be placed in a SNF. If she deteriorates further she would benefit from residential hospice.Palliative care team now involved and discussions ongoing with patient and family members. Awaiting further recommendations from palliative care     DVT Prophylaxis: Prophylactic Heparin   Code Status:  DNR  Family Communication: None at bedside  Disposition Plan: Remain inpatient- SNF on discharge.  Antimicrobial agents: None  Procedures: None  CONSULTS:  pulmonary/intensive care and nephrology   Palliative care  Time spent: 25 minutes-Greater than 50% of this time was spent in counseling, explanation of diagnosis, planning of further management, and coordination of care.  MEDICATIONS: Anti-infectives    None      Scheduled Meds: . ALPRAZolam  0.25 mg Oral QHS  . darbepoetin (ARANESP) injection - NON-DIALYSIS  100 mcg Subcutaneous Q Wed-1800  . ferric gluconate (FERRLECIT/NULECIT) IV  250 mg Intravenous Daily  . heparin  5,000 Units Subcutaneous Q8H  . mometasone-formoterol  2 puff Inhalation BID  . torsemide  40 mg Oral Daily   Continuous Infusions: . albuterol Stopped (06/06/16 2150)   PRN Meds:.acetaminophen, albuterol, ALPRAZolam, ondansetron (ZOFRAN) IV   PHYSICAL EXAM: Vital signs: Vitals:   06/11/16 0811 06/11/16 0843 06/11/16 1227 06/11/16 1331  BP: (!) 109/54  (!) 147/129 (!) 123/53  Pulse: (!) 46 (!) 46 (!) 45 (!) 47  Resp: 19 17 (!) 22 18  Temp: 98.5 F (36.9 C)  97.6 F (36.4 C) 97.8 F (36.6 C)  TempSrc: Oral   Oral Oral  SpO2: 97% 94% 98% 95%  Weight:      Height:       Filed Weights   06/10/16 0100 06/11/16 0400 06/11/16 0435  Weight: 56.5 kg (124 lb 9 oz) 57.1 kg (125 lb 12.8 oz) 58.6 kg (129 lb 3.2 oz)   Body mass index is 22.18 kg/m.   General appearance :Chronically ill-appearing frail elderly female Awake, alert, not in any distress. Speech Clear-but slow.  Eyes:, pupils equally reactive to light and accomodation,no scleral icterus.Pink conjunctiva HEENT: Atraumatic and Normocephalic Neck: supple, +JVD. No cervical lymphadenopathy. No thyromegaly Resp:Good air entry bilaterally, no added sounds  CVS: S1 S2 irregular, +syst murmur GI: Bowel sounds present, Non tender and not distended with no gaurding, rigidity or rebound.No organomegaly Extremities: B/L Lower Ext wrapped with UNNA boots Neurology:  speech clear,Non focal-but with generalized weakness, sensation is grossly intact. Psychiatric: Normal judgment and insight. Alert and oriented x 3.  Musculoskeletal:No digital cyanosis Skin:No Rash, warm and dry  I have personally reviewed following labs and imaging studies  LABORATORY DATA: CBC:  Recent Labs Lab 06/05/16 1449 06/06/16 1926 06/06/16 1930  WBC  --   --  8.6  NEUTROABS  --   --  7.3  HGB 9.2* 9.9* 8.9*  HCT  --  29.0* 29.7*  MCV  --   --  98.3  PLT  --   --  277    Basic Metabolic Panel:  Recent Labs Lab 06/06/16 1930 06/07/16 0301 06/08/16 0328 06/09/16 0442 06/11/16 0130  NA 136 139 141 140 139  K >7.5* 5.5* 5.0 4.1 4.4  CL 107 104 105 106 104  CO2 16* 24 30  28 29  GLUCOSE 105* 119* 88 89 106*  BUN 85* 78* 63* 46* 35*  CREATININE 5.28* 4.66* 3.93* 3.03* 2.24*  CALCIUM 9.1 10.0 8.8* 8.6* 8.5*  PHOS  --   --  5.1*  --   --     GFR: Estimated Creatinine Clearance: 16.1 mL/min (by C-G formula based on SCr of 2.24 mg/dL (H)).  Liver Function Tests:  Recent Labs Lab 06/06/16 1930 06/08/16 0328  AST 76*  --   ALT 75*  --   ALKPHOS 98  --    BILITOT 1.1  --   PROT 5.5*  --   ALBUMIN 3.2* 2.7*   No results for input(s): LIPASE, AMYLASE in the last 168 hours. No results for input(s): AMMONIA in the last 168 hours.  Coagulation Profile: No results for input(s): INR, PROTIME in the last 168 hours.  Cardiac Enzymes: No results for input(s): CKTOTAL, CKMB, CKMBINDEX, TROPONINI in the last 168 hours.  BNP (last 3 results)  Recent Labs  12/01/15 1052  PROBNP 1,090.0*    HbA1C: No results for input(s): HGBA1C in the last 72 hours.  CBG:  Recent Labs Lab 06/06/16 1931  GLUCAP 101*    Lipid Profile: No results for input(s): CHOL, HDL, LDLCALC, TRIG, CHOLHDL, LDLDIRECT in the last 72 hours.  Thyroid Function Tests: No results for input(s): TSH, T4TOTAL, FREET4, T3FREE, THYROIDAB in the last 72 hours.  Anemia Panel: No results for input(s): VITAMINB12, FOLATE, FERRITIN, TIBC, IRON, RETICCTPCT in the last 72 hours.  Urine analysis:    Component Value Date/Time   COLORURINE YELLOW 01/29/2016 [place] 01/29/2016 1615   LABSPEC 1.011 01/29/2016 1615   PHURINE 7.5 01/29/2016 1615   GLUCOSEU NEGATIVE 01/29/2016 1615   GLUCOSEU NEGATIVE 05/06/2015 1008   HGBUR NEGATIVE 01/29/2016 1615   BILIRUBINUR NEGATIVE 01/29/2016 1615   KETONESUR NEGATIVE 01/29/2016 1615   PROTEINUR NEGATIVE 01/29/2016 1615   UROBILINOGEN 0.2 05/06/2015 1008   NITRITE NEGATIVE 01/29/2016 1615   LEUKOCYTESUR NEGATIVE 01/29/2016 1615    Sepsis Labs: Lactic Acid, Venous    Component Value Date/Time   LATICACIDVEN 1.5 06/07/2016 3329    MICROBIOLOGY: Recent Results (from the past 240 hour(s))  MRSA PCR Screening     Status: None   Collection Time: 06/07/16  2:48 AM  Result Value Ref Range Status   MRSA by PCR NEGATIVE NEGATIVE Final    Comment:        The GeneXpert MRSA Assay (FDA approved for NASAL specimens only), is one component of a comprehensive MRSA colonization surveillance program. It is not intended  to diagnose MRSA infection nor to guide or monitor treatment for MRSA infections.     RADIOLOGY STUDIES/RESULTS: Dg Chest Portable 1 View  Result Date: 06/06/2016 CLINICAL DATA:  Found on floor at home confused crawling around, history COPD, renal failure, hypertension EXAM: PORTABLE CHEST 1 VIEW COMPARISON:  Portable exam 1957 hours compared to 02/03/2016 FINDINGS: Enlargement of cardiac silhouette. Atherosclerotic calcification aorta. Mediastinal contours and pulmonary vascularity normal for slight degree of rotation to the RIGHT. Minimal atelectasis at RIGHT base. Atelectasis versus consolidation RIGHT lower lobe. External pacing leads project over chest. Upper lungs clear. No gross pleural effusion or pneumothorax. Bones demineralized with probable BILATERAL chronic rotator cuff tears. IMPRESSION: Enlargement of cardiac silhouette. Minimal RIGHT basilar atelectasis with atelectasis versus consolidation LEFT lower lobe. Electronically Signed   By: Lavonia Dana M.D.   On: 06/06/2016 20:33     LOS: 5 days   Oren Binet,  MD  [facility] Pager:[phone]  If 7PM-7AM, please contact night-coverage [email or web address] Password [email or web address] 06/11/2016, 2:48 PM

## 2016-06-11 NOTE — Telephone Encounter (Signed)
OK SW eval Check with Cardiology re Eliquis at the next Card f/u appt pls Thx

## 2016-06-11 NOTE — Progress Notes (Signed)
Patient transferred to [phone]. Transported via wheelchair. Alert and oriented x 4. Ambulated with front wheel walker and standby supervision assist. Telemetry applied. Heart rate parameters set for 30s d/t significant bradycardia. Will continue to monitor. Bartholomew Crews, RN

## 2016-06-12 ENCOUNTER — Inpatient Hospital Stay (HOSPITAL_COMMUNITY): Admission: RE | Admit: 2016-06-12 | Payer: Medicare Other | Source: Ambulatory Visit

## 2016-06-12 DIAGNOSIS — L97211 Non-pressure chronic ulcer of right calf limited to breakdown of skin: Secondary | ICD-10-CM | POA: Diagnosis not present

## 2016-06-12 DIAGNOSIS — I5021 Acute systolic (congestive) heart failure: Secondary | ICD-10-CM | POA: Diagnosis not present

## 2016-06-12 DIAGNOSIS — I130 Hypertensive heart and chronic kidney disease with heart failure and stage 1 through stage 4 chronic kidney disease, or unspecified chronic kidney disease: Secondary | ICD-10-CM | POA: Diagnosis not present

## 2016-06-12 DIAGNOSIS — Z86718 Personal history of other venous thrombosis and embolism: Secondary | ICD-10-CM | POA: Diagnosis not present

## 2016-06-12 DIAGNOSIS — R278 Other lack of coordination: Secondary | ICD-10-CM | POA: Diagnosis not present

## 2016-06-12 DIAGNOSIS — E440 Moderate protein-calorie malnutrition: Secondary | ICD-10-CM | POA: Diagnosis not present

## 2016-06-12 DIAGNOSIS — I87333 Chronic venous hypertension (idiopathic) with ulcer and inflammation of bilateral lower extremity: Secondary | ICD-10-CM | POA: Diagnosis not present

## 2016-06-12 DIAGNOSIS — Z7901 Long term (current) use of anticoagulants: Secondary | ICD-10-CM | POA: Diagnosis not present

## 2016-06-12 DIAGNOSIS — C50919 Malignant neoplasm of unspecified site of unspecified female breast: Secondary | ICD-10-CM | POA: Diagnosis not present

## 2016-06-12 DIAGNOSIS — S8012XA Contusion of left lower leg, initial encounter: Secondary | ICD-10-CM | POA: Diagnosis not present

## 2016-06-12 DIAGNOSIS — S81811A Laceration without foreign body, right lower leg, initial encounter: Secondary | ICD-10-CM | POA: Diagnosis not present

## 2016-06-12 DIAGNOSIS — I5032 Chronic diastolic (congestive) heart failure: Secondary | ICD-10-CM | POA: Diagnosis not present

## 2016-06-12 DIAGNOSIS — Z5181 Encounter for therapeutic drug level monitoring: Secondary | ICD-10-CM | POA: Diagnosis not present

## 2016-06-12 DIAGNOSIS — S81801D Unspecified open wound, right lower leg, subsequent encounter: Secondary | ICD-10-CM | POA: Diagnosis not present

## 2016-06-12 DIAGNOSIS — E876 Hypokalemia: Secondary | ICD-10-CM | POA: Diagnosis not present

## 2016-06-12 DIAGNOSIS — N189 Chronic kidney disease, unspecified: Secondary | ICD-10-CM | POA: Diagnosis not present

## 2016-06-12 DIAGNOSIS — L97215 Non-pressure chronic ulcer of right calf with muscle involvement without evidence of necrosis: Secondary | ICD-10-CM | POA: Diagnosis not present

## 2016-06-12 DIAGNOSIS — W19XXXA Unspecified fall, initial encounter: Secondary | ICD-10-CM | POA: Diagnosis not present

## 2016-06-12 DIAGNOSIS — M1990 Unspecified osteoarthritis, unspecified site: Secondary | ICD-10-CM | POA: Diagnosis not present

## 2016-06-12 DIAGNOSIS — E038 Other specified hypothyroidism: Secondary | ICD-10-CM | POA: Diagnosis not present

## 2016-06-12 DIAGNOSIS — I82629 Acute embolism and thrombosis of deep veins of unspecified upper extremity: Secondary | ICD-10-CM | POA: Diagnosis not present

## 2016-06-12 DIAGNOSIS — R4182 Altered mental status, unspecified: Secondary | ICD-10-CM | POA: Diagnosis not present

## 2016-06-12 DIAGNOSIS — L97821 Non-pressure chronic ulcer of other part of left lower leg limited to breakdown of skin: Secondary | ICD-10-CM | POA: Diagnosis not present

## 2016-06-12 DIAGNOSIS — M6281 Muscle weakness (generalized): Secondary | ICD-10-CM | POA: Diagnosis not present

## 2016-06-12 DIAGNOSIS — L97811 Non-pressure chronic ulcer of other part of right lower leg limited to breakdown of skin: Secondary | ICD-10-CM | POA: Diagnosis not present

## 2016-06-12 DIAGNOSIS — Z9181 History of falling: Secondary | ICD-10-CM | POA: Diagnosis not present

## 2016-06-12 DIAGNOSIS — L97229 Non-pressure chronic ulcer of left calf with unspecified severity: Secondary | ICD-10-CM | POA: Diagnosis not present

## 2016-06-12 DIAGNOSIS — J9601 Acute respiratory failure with hypoxia: Secondary | ICD-10-CM | POA: Diagnosis not present

## 2016-06-12 DIAGNOSIS — S81001D Unspecified open wound, right knee, subsequent encounter: Secondary | ICD-10-CM | POA: Diagnosis not present

## 2016-06-12 DIAGNOSIS — I5043 Acute on chronic combined systolic (congestive) and diastolic (congestive) heart failure: Secondary | ICD-10-CM | POA: Diagnosis not present

## 2016-06-12 DIAGNOSIS — I82622 Acute embolism and thrombosis of deep veins of left upper extremity: Secondary | ICD-10-CM | POA: Diagnosis not present

## 2016-06-12 DIAGNOSIS — Z96653 Presence of artificial knee joint, bilateral: Secondary | ICD-10-CM | POA: Diagnosis not present

## 2016-06-12 DIAGNOSIS — F419 Anxiety disorder, unspecified: Secondary | ICD-10-CM | POA: Diagnosis not present

## 2016-06-12 DIAGNOSIS — I480 Paroxysmal atrial fibrillation: Secondary | ICD-10-CM | POA: Diagnosis not present

## 2016-06-12 DIAGNOSIS — J449 Chronic obstructive pulmonary disease, unspecified: Secondary | ICD-10-CM | POA: Diagnosis not present

## 2016-06-12 DIAGNOSIS — I5023 Acute on chronic systolic (congestive) heart failure: Secondary | ICD-10-CM | POA: Diagnosis not present

## 2016-06-12 DIAGNOSIS — R609 Edema, unspecified: Secondary | ICD-10-CM | POA: Diagnosis not present

## 2016-06-12 DIAGNOSIS — N184 Chronic kidney disease, stage 4 (severe): Secondary | ICD-10-CM | POA: Diagnosis not present

## 2016-06-12 DIAGNOSIS — I482 Chronic atrial fibrillation: Secondary | ICD-10-CM | POA: Diagnosis not present

## 2016-06-12 DIAGNOSIS — I5042 Chronic combined systolic (congestive) and diastolic (congestive) heart failure: Secondary | ICD-10-CM | POA: Diagnosis present

## 2016-06-12 DIAGNOSIS — L97213 Non-pressure chronic ulcer of right calf with necrosis of muscle: Secondary | ICD-10-CM | POA: Diagnosis not present

## 2016-06-12 DIAGNOSIS — I10 Essential (primary) hypertension: Secondary | ICD-10-CM | POA: Diagnosis not present

## 2016-06-12 DIAGNOSIS — E875 Hyperkalemia: Secondary | ICD-10-CM | POA: Diagnosis not present

## 2016-06-12 DIAGNOSIS — L97929 Non-pressure chronic ulcer of unspecified part of left lower leg with unspecified severity: Secondary | ICD-10-CM | POA: Diagnosis not present

## 2016-06-12 DIAGNOSIS — I82619 Acute embolism and thrombosis of superficial veins of unspecified upper extremity: Secondary | ICD-10-CM | POA: Diagnosis not present

## 2016-06-12 DIAGNOSIS — N183 Chronic kidney disease, stage 3 (moderate): Secondary | ICD-10-CM | POA: Diagnosis not present

## 2016-06-12 DIAGNOSIS — D6489 Other specified anemias: Secondary | ICD-10-CM | POA: Diagnosis not present

## 2016-06-12 DIAGNOSIS — I87313 Chronic venous hypertension (idiopathic) with ulcer of bilateral lower extremity: Secondary | ICD-10-CM | POA: Diagnosis not present

## 2016-06-12 DIAGNOSIS — Z79899 Other long term (current) drug therapy: Secondary | ICD-10-CM | POA: Diagnosis not present

## 2016-06-12 DIAGNOSIS — I4891 Unspecified atrial fibrillation: Secondary | ICD-10-CM | POA: Diagnosis not present

## 2016-06-12 DIAGNOSIS — L97221 Non-pressure chronic ulcer of left calf limited to breakdown of skin: Secondary | ICD-10-CM | POA: Diagnosis not present

## 2016-06-12 DIAGNOSIS — R001 Bradycardia, unspecified: Secondary | ICD-10-CM | POA: Diagnosis not present

## 2016-06-12 DIAGNOSIS — Z923 Personal history of irradiation: Secondary | ICD-10-CM | POA: Diagnosis not present

## 2016-06-12 DIAGNOSIS — I87331 Chronic venous hypertension (idiopathic) with ulcer and inflammation of right lower extremity: Secondary | ICD-10-CM | POA: Diagnosis not present

## 2016-06-12 DIAGNOSIS — R945 Abnormal results of liver function studies: Secondary | ICD-10-CM | POA: Diagnosis not present

## 2016-06-12 DIAGNOSIS — E43 Unspecified severe protein-calorie malnutrition: Secondary | ICD-10-CM | POA: Diagnosis not present

## 2016-06-12 DIAGNOSIS — Z9221 Personal history of antineoplastic chemotherapy: Secondary | ICD-10-CM | POA: Diagnosis not present

## 2016-06-12 DIAGNOSIS — Z853 Personal history of malignant neoplasm of breast: Secondary | ICD-10-CM | POA: Diagnosis not present

## 2016-06-12 DIAGNOSIS — Z87891 Personal history of nicotine dependence: Secondary | ICD-10-CM | POA: Diagnosis not present

## 2016-06-12 DIAGNOSIS — N179 Acute kidney failure, unspecified: Secondary | ICD-10-CM | POA: Diagnosis not present

## 2016-06-12 MED ORDER — ALPRAZOLAM 0.5 MG PO TABS: 0.5000 mg | ORAL_TABLET | Freq: Two times a day (BID) | ORAL | 0 refills | Status: DC | PRN

## 2016-06-12 MED ORDER — ALPRAZOLAM 0.25 MG PO TABS: 0.2500 mg | ORAL_TABLET | Freq: Every day | ORAL | 0 refills | Status: DC

## 2016-06-12 NOTE — Progress Notes (Signed)
Call twice to given receiving RN report, informed that I would be called back Neta Mends RN 1:50 PM 06-12-2016

## 2016-06-12 NOTE — Clinical Social Work Note (Signed)
CSW facilitated patient discharge including contacting patient family and facility to confirm patient discharge plans. Clinical information faxed to facility and family agreeable with plan. CSW arranged ambulance transport via [facility] to [facility] around 1:30 pm. RN to call report prior to discharge [phone] Room [phone]).  CSW will sign off for now as social work intervention is no longer needed. Please consult [name] again if new needs arise.  Dayton Scrape, [place]

## 2016-06-12 NOTE — Progress Notes (Signed)
Physical Therapy Treatment Patient Details Name: Mia Contreras MRN: [id number] DOB: 1929-04-27 Today's Date: 06/12/2016    History of Present Illness Patient is a 82 y.o. female systolic heart failure, chronic kidney disease stage IV, atrial fibrillation on anticoagulation presented to the ED with weakness, confusion and shortness of breath. She was found to have severe hyperkalemia, acute on chronic kidney disease and severe bradycardia. She was given IV insulin, IV calcium gluconate and placed on a bicarbonate infusion and admitted to the hospitalist service. She was seen in consult by nephrology and felt not to be a great candidate for long-term dialysis.    PT Comments    Continuing work on functional mobility and safety with walking; ataxic gait, unsteady on feet, heartily endorse use of RW for safety with amb; Cues to self-monitor for activity tolerance; Ms. Heffernan is in agreement with SNF stay  Follow Up Recommendations  SNF;Supervision/Assistance - 24 hour     Equipment Recommendations  None recommended by PT    Recommendations for Other Services       Precautions / Restrictions Precautions Precautions: Fall Restrictions Weight Bearing Restrictions: No    Mobility  Bed Mobility Overal bed mobility: Needs Assistance Bed Mobility: Supine to Sit     Supine to sit: Min guard     General bed mobility comments: [place] for safety  Transfers Overall transfer level: Needs assistance Equipment used: Rolling walker (2 wheeled) Transfers: Sit to/from Stand Sit to Stand: Min assist         General transfer comment: Min assist to power up and stabilize at initial stand  Ambulation/Gait Ambulation/Gait assistance: Min assist;Mod assist;+2 safety/equipment Ambulation Distance (Feet): 110 Feet (4 standing rest breaks) Assistive device: Rolling walker (2 wheeled) Gait Pattern/deviations: Step-through pattern;Ataxic     General Gait Details: Pt was able to ambulate  with VSS however her gait pattern is very unsteady.  Pt would have fallen if PT was not guarding/assisting pt.  Needed assist to slow down and stay close to RW as well. Cues to self-monitor for activity tolerance   Stairs            Wheelchair Mobility    Modified Rankin (Stroke Patients Only)       Balance     Sitting balance-Leahy Scale: Fair (approaching Good)       Standing balance-Leahy Scale: Poor                      Cognition Arousal/Alertness: Awake/alert Behavior During Therapy: WFL for tasks assessed/performed Overall Cognitive Status: Within Functional Limits for tasks assessed                      Exercises      General Comments        Pertinent Vitals/Pain Pain Assessment: Faces Faces Pain Scale: Hurts a little bit Pain Location: did not specify; grimace could be related to effort Pain Descriptors / Indicators: Grimacing Pain Intervention(s): Limited activity within patient's tolerance    Home Living                      Prior Function            PT Goals (current goals can now be found in the care plan section) Acute Rehab PT Goals Patient Stated Goal: to get better PT Goal Formulation: With patient Time For Goal Achievement: 06/22/16 Potential to Achieve Goals: Good Progress towards PT goals: Progressing toward goals  Frequency    Min 3X/week      PT Plan Current plan remains appropriate    Co-evaluation             End of Session Equipment Utilized During Treatment: Gait belt;Oxygen Activity Tolerance: Patient limited by fatigue Patient left: in chair;with call bell/phone within reach;with chair alarm set     Time: 1011-1035 PT Time Calculation (min) (ACUTE ONLY): 24 min  Charges:  $Gait Training: 23-37 mins                    G Codes:      Quin Hoop 06/12/2016, 11:31 AM  Roney Marion, [place] Pager [phone] Office [phone]

## 2016-06-12 NOTE — Consult Note (Signed)
   [facility] CM Inpatient Consult   06/12/2016  Mia Contreras 08/18/1929 [id number]     Patient screened for potential [facility] Care Management services. Chart reviewed. Noted current discharge plan is for SNF today.  There are no identifiable [facility] Care Management needs at this time. Confirmed with inpatient RNCM.  Marthenia Rolling, MSN-Ed, RN,BSN [facility] Liaison [phone]

## 2016-06-12 NOTE — Discharge Summary (Signed)
PATIENT DETAILS Name: Mia Contreras Age: 82 y.o. Sex: female Date of Birth: 1929/08/07 MRN: [id number]. Admitting Physician: Etta Quill, DO WEX:HBZJ Plotnikov, MD  Admit Date: 06/06/2016 Discharge date: 06/12/2016  Recommendations for Outpatient Follow-up:  1. Follow up with PCP in 1-2 weeks 2. Please ensure palliative care/hospice follow-up at SNF, if patient deteriorates-consider transitioning to full comfort measures.   Admitted From:  Home  Disposition: SNF   Home Health: No  Equipment/Devices: None  Discharge Condition: Stable  CODE STATUS: DNR  Diet recommendation:  Heart Healthy  Brief Summary: See H&P, Labs, Consult and Test reports for all details in brief, Patient is a 82 y.o. female systolic heart failure, chronic kidney disease stage IV, atrial fibrillation on anticoagulation presented to the ED with weakness, confusion and shortness of breath. She was found to have severe hyperkalemia, acute on chronic kidney disease and severe bradycardia. She was given IV insulin, IV calcium gluconate and placed on a bicarbonate infusion and admitted to the hospitalist service. She was seen in consult by nephrology and felt not to be a great candidate for long-term dialysis. See below for further details  Brief Hospital Course: Hyperkalemia: Multifactorial etiology-secondary to acute on chronic renal failure, and Aldactone. Potassium has now normalized with IV bicarbonate infusion (now discontinued), she also received IV insulin in the emergency room. Aldactone remains on hold. Plans are to follow electrolytes and continue supportive care.   Active Problems: Acute on chronic kidney disease stage IV: ARF thought to be hemodynamically mediated-probably secondary to Aldactone, cardiorenal syndrome. Renal function improving with just supportive care. Evaluated by nephrology, felt not to be a candidate for dialysis. Recommendations are to continue with supportive care,  given very poor overall prognoses and frailty-and potential for further deterioration-nephrology recommends initiation of palliative measures. Subsequently seen by palliative care services, plans are to continue gentle treatment and mostly focus on comfort. Plans are currently to discharge to skilled nursing facility with hospice/palliative care follow-up, and to transition to hospice care if she declined again.  Acute on chronic systolic heart failure: Appears to be volume overloaded with increasing weight-although blood pressure still soft-we have restarted torsemide-mostly for comfort. Initially all diuretics were held on admission due to worsening renal function and hyperkalemia.Patient does not aggressive care-given a very poor prognoses-we will defer cardiology evaluation for inotropes/pressors at this time. Was followed by the palliative care team, although inpatient/residential hospice was initially contemplated-given that the patient has somewhat improved than on initial presentation-plans are to discharge to SNF with hospice/palliative care follow-up, and to transition to inpatient hospice if she declines further. Spoke with patient's primary cardiologist, Dr. Jenkins Rouge over the phone on 10/23 and have updated him of the above plan.  Bradycardia: Initially admitted with wide complex bradycardia/junctional rhythm-from severe hyperkalemia-rate was mostly in the 20s. With correction of hyperkalemia, heart rate has improved and now is mostly in the 40s and 50s. Currently off amiodarone/Bisoprolol-as noted above-patient does not desire aggressive care. Although heart rate in 40-50's,she seems to be tolerating it well.   Hypertension: Blood pressure soft, continue to hold all antihypertensives.See above  Anemia: Probably secondary to chronic kidney disease, hemoglobin currently stable-no need for PRBC transfusion. No evidence of any overt blood loss.  History of Atrial fibrillation: Rate  controlled-bradycardic in the 40s and 50s--currently off the beta blockers/amiodarone and Eliquis  Spoke with patient at bedside, given frail clinical status-significant chance of fall risk-she is agreeable to permanently discontinue anticoagulation at this time. Subsequently spoke with her  niece over the phone, was agreeable as well. Family is aware of this difficult situation and the risk of cardiac embolic phenomenon including stroke. As noted above, patient does not desire aggressive care, and I suspect she will best benefit from initiation of palliative/hospice measures if she were to decline in the future.   History of lower extremity wounds: Chronic issue-present prior to admission-has UNNA boots bilaterally-wound care evaluation appreciated.  Hx breast cancer s/p chemo 2000  Frailty/failure to thrive syndrome: Continue supportive care, she will need continued follow-up with hospice/palliative care team at [facility]  Palliative care/goals of care: Unfortunate elderly female with chronic systolic heart failure and stage IV chronic kidney disease-admitted with severe hyperkalemia and worsening renal function. Evaluated by nephrology, and deemed not a dialysis candidate. She is very frail and does not desire aggressive care at this time. Patient is very frail with failure to thrive syndrome, she has improved somewhat than on initial presentation-but is at significant risk of further decompensation in the new future and at risk for repeat hospitalization. Palliative care team evaluated the patient, although residential hospice was contemplated, at this time patient elects to go to SNF with palliative care/hospice follow-up-and if patient declined/deteriorated in the future, to transition to residential hospice. DO NOT RESUSCITATE in place.  Procedures/Studies: None  Discharge Diagnoses:  Principal Problem:   Hyperkalemia Active Problems:   Chronic systolic heart failure (HCC)   Acute kidney injury  superimposed on chronic kidney disease (HCC)   CKD (chronic kidney disease) stage 4, GFR 15-29 ml/min (HCC)   Acute systolic CHF (congestive heart failure) (HCC)   Symptomatic bradycardia   Discharge Instructions:  Activity:  As tolerated with Full fall precautions use walker/cane & assistance as needed   Discharge Instructions    (HEART FAILURE PATIENTS) Call MD:  Anytime you have any of the following symptoms: 1) 3 pound weight gain in 24 hours or 5 pounds in 1 week 2) shortness of breath, with or without a dry hacking cough 3) swelling in the hands, feet or stomach 4) if you have to sleep on extra pillows at night in order to breathe.    Complete by:  As directed    Call MD for:  extreme fatigue    Complete by:  As directed    Call MD for:  persistant nausea and vomiting    Complete by:  As directed    Diet - low sodium heart healthy    Complete by:  As directed    Increase activity slowly    Complete by:  As directed        Medication List    STOP taking these medications   amiodarone 200 MG tablet Commonly known as:  PACERONE   apixaban 2.5 MG Tabs tablet Commonly known as:  ELIQUIS   bisoprolol 5 MG tablet Commonly known as:  ZEBETA     TAKE these medications   ALPRAZolam 0.5 MG tablet Commonly known as:  XANAX Take 1 tablet (0.5 mg total) by mouth 2 (two) times daily as needed for anxiety. What changed:  See the new instructions.   ALPRAZolam 0.25 MG tablet Commonly known as:  XANAX Take 1 tablet (0.25 mg total) by mouth at bedtime. What changed:  You were already taking a medication with the same name, and this prescription was added. Make sure you understand how and when to take each.   CALCIUM 600+D 600-400 MG-UNIT tablet Generic drug:  Calcium Carbonate-Vitamin D Take 1 tablet by mouth daily.  Fluticasone-Salmeterol 250-50 MCG/DOSE Aepb Commonly known as:  ADVAIR DISKUS Inhale 1 puff into the lungs 2 (two) times daily.   MULTIVITAMIN PO Take 1  tablet by mouth daily.   mupirocin ointment 2 % Commonly known as:  BACTROBAN Use qd What changed:  how much to take  how to take this  when to take this  additional instructions   torsemide 20 MG tablet Commonly known as:  DEMADEX Take 2 tablets (40 mg total) by mouth 2 (two) times daily.   vitamin C 500 MG tablet Commonly known as:  ASCORBIC ACID Take 1 tablet (500 mg total) by mouth daily.      Follow-up Information    Walker Kehr, MD. Schedule an appointment as soon as possible for a visit in 2 week(s).   Specialty:  Internal Medicine Contact information: [place] [address] [phone]        Jenkins Rouge, MD. Schedule an appointment as soon as possible for a visit today.   Specialty:  Cardiology Why:  as needed Contact information: [address] [place] [address] [phone]          Allergies  Allergen Reactions  . Cefuroxime Diarrhea  . Celecoxib Nausea Only  . Deltasone [Prednisone]     Oral deltasone is causing swelling (can use Depo-Medrol)  . Ranitidine     bloating  . Tramadol Hcl Nausea Only    Consultations:   nephrology and Palliative care    Other Procedures/Studies: Dg Chest Portable 1 View  Result Date: 06/06/2016 CLINICAL DATA:  Found on floor at home confused crawling around, history COPD, renal failure, hypertension EXAM: PORTABLE CHEST 1 VIEW COMPARISON:  Portable exam 1957 hours compared to 02/03/2016 FINDINGS: Enlargement of cardiac silhouette. Atherosclerotic calcification aorta. Mediastinal contours and pulmonary vascularity normal for slight degree of rotation to the RIGHT. Minimal atelectasis at RIGHT base. Atelectasis versus consolidation RIGHT lower lobe. External pacing leads project over chest. Upper lungs clear. No gross pleural effusion or pneumothorax. Bones demineralized with probable BILATERAL chronic rotator cuff tears. IMPRESSION: Enlargement of cardiac silhouette.  Minimal RIGHT basilar atelectasis with atelectasis versus consolidation LEFT lower lobe. Electronically Signed   By: Lavonia Dana M.D.   On: 06/06/2016 20:33      TODAY-DAY OF DISCHARGE:  Subjective:   Mia Contreras today has no headache,no chest abdominal pain,no new weakness tingling or numbness, feels much better wants to go home today.   Objective:   Blood pressure (!) 94/46, pulse (!) 49, temperature 98.4 F (36.9 C), temperature source Oral, resp. rate 16, height 5\' 4"  (1.626 m), weight 58.8 kg (129 lb 10.1 oz), SpO2 96 %.  Intake/Output Summary (Last 24 hours) at 06/12/16 1038 Last data filed at 06/12/16 0930  Gross per 24 hour  Intake             1020 ml  Output             1450 ml  Net             -430 ml   Filed Weights   06/11/16 0400 06/11/16 0435 06/11/16 2107  Weight: 57.1 kg (125 lb 12.8 oz) 58.6 kg (129 lb 3.2 oz) 58.8 kg (129 lb 10.1 oz)    Exam: Awake Alert, Oriented *3, No new F.N deficits, Normal affect .AT,PERRAL Supple Neck,No JVD, No cervical lymphadenopathy appriciated.  Symmetrical Chest wall movement, Good air movement bilaterally, CTAB RRR,No Gallops,Rubs or new Murmurs, No Parasternal Heave +ve  B.Sounds, Abd Soft, Non tender, No organomegaly appriciated, No rebound -guarding or rigidity. No Cyanosis, Clubbing or edema, No new Rash or bruise   PERTINENT RADIOLOGIC STUDIES: Dg Chest Portable 1 View  Result Date: 06/06/2016 CLINICAL DATA:  Found on floor at home confused crawling around, history COPD, renal failure, hypertension EXAM: PORTABLE CHEST 1 VIEW COMPARISON:  Portable exam 1957 hours compared to 02/03/2016 FINDINGS: Enlargement of cardiac silhouette. Atherosclerotic calcification aorta. Mediastinal contours and pulmonary vascularity normal for slight degree of rotation to the RIGHT. Minimal atelectasis at RIGHT base. Atelectasis versus consolidation RIGHT lower lobe. External pacing leads project over chest. Upper lungs clear. No gross  pleural effusion or pneumothorax. Bones demineralized with probable BILATERAL chronic rotator cuff tears. IMPRESSION: Enlargement of cardiac silhouette. Minimal RIGHT basilar atelectasis with atelectasis versus consolidation LEFT lower lobe. Electronically Signed   By: Lavonia Dana M.D.   On: 06/06/2016 20:33     PERTINENT LAB RESULTS: CBC: No results for input(s): WBC, HGB, HCT, PLT in the last 72 hours. CMET CMP     Component Value Date/Time   NA 139 06/11/2016 0130   K 4.4 06/11/2016 0130   CL 104 06/11/2016 0130   CO2 29 06/11/2016 0130   GLUCOSE 106 (H) 06/11/2016 0130   GLUCOSE 101 (H) 08/01/2006 0841   BUN 35 (H) 06/11/2016 0130   CREATININE 2.24 (H) 06/11/2016 0130   CREATININE 2.14 (H) 11/29/2015 1522   CALCIUM 8.5 (L) 06/11/2016 0130   PROT 5.5 (L) 06/06/2016 1930   ALBUMIN 2.7 (L) 06/08/2016 0328   AST 76 (H) 06/06/2016 1930   ALT 75 (H) 06/06/2016 1930   ALKPHOS 98 06/06/2016 1930   BILITOT 1.1 06/06/2016 1930   GFRNONAA 19 (L) 06/11/2016 0130   GFRAA 22 (L) 06/11/2016 0130    GFR Estimated Creatinine Clearance: 16.1 mL/min (by C-G formula based on SCr of 2.24 mg/dL (H)). No results for input(s): LIPASE, AMYLASE in the last 72 hours. No results for input(s): CKTOTAL, CKMB, CKMBINDEX, TROPONINI in the last 72 hours. Invalid input(s): POCBNP No results for input(s): DDIMER in the last 72 hours. No results for input(s): HGBA1C in the last 72 hours. No results for input(s): CHOL, HDL, LDLCALC, TRIG, CHOLHDL, LDLDIRECT in the last 72 hours. No results for input(s): TSH, T4TOTAL, T3FREE, THYROIDAB in the last 72 hours.  Invalid input(s): FREET3 No results for input(s): VITAMINB12, FOLATE, FERRITIN, TIBC, IRON, RETICCTPCT in the last 72 hours. Coags: No results for input(s): INR in the last 72 hours.  Invalid input(s): PT Microbiology: Recent Results (from the past 240 hour(s))  MRSA PCR Screening     Status: None   Collection Time: 06/07/16  2:48 AM  Result  Value Ref Range Status   MRSA by PCR NEGATIVE NEGATIVE Final    Comment:        The GeneXpert MRSA Assay (FDA approved for NASAL specimens only), is one component of a comprehensive MRSA colonization surveillance program. It is not intended to diagnose MRSA infection nor to guide or monitor treatment for MRSA infections.     FURTHER DISCHARGE INSTRUCTIONS:  Get Medicines reviewed and adjusted: Please take all your medications with you for your next visit with your Primary MD  Laboratory/radiological data: Please request your Primary MD to go over all hospital tests and procedure/radiological results at the follow up, please ask your Primary MD to get all Hospital records sent to his/her office.  In some cases, they will be blood work, cultures and biopsy results pending at  the time of your discharge. Please request that your primary care M.D. goes through all the records of your hospital data and follows up on these results.  Also Note the following: If you experience worsening of your admission symptoms, develop shortness of breath, life threatening emergency, suicidal or homicidal thoughts you must seek medical attention immediately by calling 911 or calling your MD immediately  if symptoms less severe.  You must read complete instructions/literature along with all the possible adverse reactions/side effects for all the Medicines you take and that have been prescribed to you. Take any new Medicines after you have completely understood and accpet all the possible adverse reactions/side effects.   Do not drive when taking Pain medications or sleeping medications (Benzodaizepines)  Do not take more than prescribed Pain, Sleep and Anxiety Medications. It is not advisable to combine anxiety,sleep and pain medications without talking with your primary care practitioner  Special Instructions: If you have smoked or chewed Tobacco  in the last 2 yrs please stop smoking, stop any regular  Alcohol  and or any Recreational drug use.  Wear Seat belts while driving.  Please note: You were cared for by a hospitalist during your hospital stay. Once you are discharged, your primary care physician will handle any further medical issues. Please note that NO REFILLS for any discharge medications will be authorized once you are discharged, as it is imperative that you return to your primary care physician (or establish a relationship with a primary care physician if you do not have one) for your post hospital discharge needs so that they can reassess your need for medications and monitor your lab values.  Total Time spent coordinating discharge including counseling, education and face to face time equals  45 minutes.  SignedOren Binet 06/12/2016 10:38 AM

## 2016-06-13 NOTE — Telephone Encounter (Signed)
I called Mia Contreras with [facility]. She states pt is now at [facility].

## 2016-06-14 ENCOUNTER — Telehealth: Payer: Self-pay | Admitting: *Deleted

## 2016-06-14 DIAGNOSIS — N189 Chronic kidney disease, unspecified: Secondary | ICD-10-CM | POA: Diagnosis not present

## 2016-06-14 DIAGNOSIS — J449 Chronic obstructive pulmonary disease, unspecified: Secondary | ICD-10-CM | POA: Diagnosis not present

## 2016-06-14 DIAGNOSIS — I4891 Unspecified atrial fibrillation: Secondary | ICD-10-CM | POA: Diagnosis not present

## 2016-06-14 DIAGNOSIS — I5023 Acute on chronic systolic (congestive) heart failure: Secondary | ICD-10-CM | POA: Diagnosis not present

## 2016-06-14 DIAGNOSIS — N179 Acute kidney failure, unspecified: Secondary | ICD-10-CM | POA: Diagnosis not present

## 2016-06-14 NOTE — Telephone Encounter (Signed)
Pt was on TCM list admitted 10/17 and discharge 10/23, and sent to SNF...Mia Contreras

## 2016-06-15 ENCOUNTER — Encounter (HOSPITAL_COMMUNITY): Payer: Self-pay | Admitting: Internal Medicine

## 2016-06-15 ENCOUNTER — Encounter (HOSPITAL_COMMUNITY): Payer: Medicare Other | Admitting: Internal Medicine

## 2016-06-15 ENCOUNTER — Ambulatory Visit (HOSPITAL_COMMUNITY)
Admission: RE | Admit: 2016-06-15 | Discharge: 2016-06-15 | Disposition: A | Discharge status: Home / Self Care | Payer: No Typology Code available for payment source | Source: Ambulatory Visit | Attending: Internal Medicine | Admitting: Internal Medicine

## 2016-06-15 VITALS — BP 104/56 | HR 64 | Wt 121.5 lb

## 2016-06-15 DIAGNOSIS — S81811A Laceration without foreign body, right lower leg, initial encounter: Secondary | ICD-10-CM | POA: Diagnosis not present

## 2016-06-15 DIAGNOSIS — I82622 Acute embolism and thrombosis of deep veins of left upper extremity: Secondary | ICD-10-CM | POA: Diagnosis not present

## 2016-06-15 DIAGNOSIS — I87333 Chronic venous hypertension (idiopathic) with ulcer and inflammation of bilateral lower extremity: Secondary | ICD-10-CM | POA: Diagnosis not present

## 2016-06-15 DIAGNOSIS — J449 Chronic obstructive pulmonary disease, unspecified: Secondary | ICD-10-CM | POA: Insufficient documentation

## 2016-06-15 DIAGNOSIS — L97811 Non-pressure chronic ulcer of other part of right lower leg limited to breakdown of skin: Secondary | ICD-10-CM | POA: Diagnosis not present

## 2016-06-15 DIAGNOSIS — I10 Essential (primary) hypertension: Secondary | ICD-10-CM | POA: Diagnosis not present

## 2016-06-15 DIAGNOSIS — Z9221 Personal history of antineoplastic chemotherapy: Secondary | ICD-10-CM | POA: Insufficient documentation

## 2016-06-15 DIAGNOSIS — D6489 Other specified anemias: Secondary | ICD-10-CM | POA: Diagnosis not present

## 2016-06-15 DIAGNOSIS — Z9181 History of falling: Secondary | ICD-10-CM | POA: Diagnosis not present

## 2016-06-15 DIAGNOSIS — Z853 Personal history of malignant neoplasm of breast: Secondary | ICD-10-CM | POA: Insufficient documentation

## 2016-06-15 DIAGNOSIS — L97221 Non-pressure chronic ulcer of left calf limited to breakdown of skin: Secondary | ICD-10-CM | POA: Diagnosis not present

## 2016-06-15 DIAGNOSIS — I480 Paroxysmal atrial fibrillation: Secondary | ICD-10-CM | POA: Diagnosis not present

## 2016-06-15 DIAGNOSIS — I5032 Chronic diastolic (congestive) heart failure: Secondary | ICD-10-CM | POA: Diagnosis not present

## 2016-06-15 DIAGNOSIS — I482 Chronic atrial fibrillation, unspecified: Secondary | ICD-10-CM

## 2016-06-15 DIAGNOSIS — Z79899 Other long term (current) drug therapy: Secondary | ICD-10-CM | POA: Insufficient documentation

## 2016-06-15 DIAGNOSIS — E43 Unspecified severe protein-calorie malnutrition: Secondary | ICD-10-CM | POA: Diagnosis not present

## 2016-06-15 DIAGNOSIS — R945 Abnormal results of liver function studies: Secondary | ICD-10-CM | POA: Diagnosis not present

## 2016-06-15 DIAGNOSIS — I130 Hypertensive heart and chronic kidney disease with heart failure and stage 1 through stage 4 chronic kidney disease, or unspecified chronic kidney disease: Secondary | ICD-10-CM | POA: Diagnosis not present

## 2016-06-15 DIAGNOSIS — I5042 Chronic combined systolic (congestive) and diastolic (congestive) heart failure: Secondary | ICD-10-CM | POA: Insufficient documentation

## 2016-06-15 DIAGNOSIS — Z87891 Personal history of nicotine dependence: Secondary | ICD-10-CM | POA: Insufficient documentation

## 2016-06-15 DIAGNOSIS — L97229 Non-pressure chronic ulcer of left calf with unspecified severity: Secondary | ICD-10-CM | POA: Diagnosis not present

## 2016-06-15 DIAGNOSIS — I82619 Acute embolism and thrombosis of superficial veins of unspecified upper extremity: Secondary | ICD-10-CM | POA: Diagnosis not present

## 2016-06-15 DIAGNOSIS — N183 Chronic kidney disease, stage 3 (moderate): Secondary | ICD-10-CM | POA: Insufficient documentation

## 2016-06-15 DIAGNOSIS — N184 Chronic kidney disease, stage 4 (severe): Secondary | ICD-10-CM | POA: Diagnosis not present

## 2016-06-15 DIAGNOSIS — L97215 Non-pressure chronic ulcer of right calf with muscle involvement without evidence of necrosis: Secondary | ICD-10-CM | POA: Diagnosis not present

## 2016-06-15 DIAGNOSIS — N179 Acute kidney failure, unspecified: Secondary | ICD-10-CM | POA: Insufficient documentation

## 2016-06-15 DIAGNOSIS — I5023 Acute on chronic systolic (congestive) heart failure: Secondary | ICD-10-CM | POA: Diagnosis not present

## 2016-06-15 DIAGNOSIS — I4891 Unspecified atrial fibrillation: Secondary | ICD-10-CM | POA: Diagnosis not present

## 2016-06-15 DIAGNOSIS — L97213 Non-pressure chronic ulcer of right calf with necrosis of muscle: Secondary | ICD-10-CM | POA: Diagnosis not present

## 2016-06-15 DIAGNOSIS — E038 Other specified hypothyroidism: Secondary | ICD-10-CM | POA: Diagnosis not present

## 2016-06-15 DIAGNOSIS — L97821 Non-pressure chronic ulcer of other part of left lower leg limited to breakdown of skin: Secondary | ICD-10-CM | POA: Diagnosis not present

## 2016-06-15 NOTE — Progress Notes (Signed)
Patient ID: Mia Contreras, female   DOB: 1929-02-11, 82 y.o.   MRN: [id number]    Advanced Heart Failure Clinic Note   Referring Physician: Dr Johnsie Cancel Primary Care: Arlana Lindau, MD Primary Cardiologist: Dr Johnsie Cancel Primary HF: Dr. Haroldine Laws  HPI: Mia Contreras is a 82 y.o. female with a history of nonischemic cardiomyopathy, systolic CHF EF 08-65% in 2016 (echo 45-50% in 11/2015), PACs, L Breast cancer in 2000 with surgery and chemo.  She has never had a cath. Had 4 rounds of chemo for breast CA. Doesn't remember which agents she got. Does not recall a red medicine.   Admitted 4/25 -12/19/15 with A/C combined CHF and AKI. Creatinine improved.  She diuresed 12 L and down 19 lbs from highest weight that admission. Discharge weight 134 lbs.   Admitted 6/7 -> 02/07/16 with volume overload. Diuresed 10 lbs on IV lasix and transitioned to torsemide for home. Completed course of levaquin for left basilar infiltrates on CXR.  Was discharged to [facility] due to frequent falls at home. Discharge weight 125 lbs.   Was admitted last week (10/17) with severe symptomatic hyperkalemia (K > 7.5) and ARF (Creatinine 5.3). Was initially bradycardic with HRs in 20s. Spiro and potassium stopped. Treated with fluids and bicarb and recovered. On d/c 10/22 K 4.4 Cr 2.2 Discussed Palliative Care dicsussed but not pursued. B-blocker stopped.    She presents today for post hospital follow up.  She remains at [facility]. Tells me that she was diagnosed with LUE clot at site of IV and Eliquis started. Weight stable. Has restarted PT and now doing exercise bike. Denies SOB/PND/Orthopnea. Has wounds on BLE going to [place] later today.     11/2015 ECHO EF 45-50%  12/16/14  ECHO Normal LV size with EF 30-35%, diffuse hypokinesis. Normal RVsize with mildly decreased systolic function. Moderate to severeLAE. Mild MR. Moderate pulmonary hypertension.  Recent labs: 12/01/15: Sodium 140 K 5.0 creatinine 2.5 (was  1.3 in 3/17). Albumin 3.8 BNP 1090 12/19/15: K 4.0, Creatinine 1.67 12/28/15: K 5.4, Creatinine 2.26 12/31/15 K 4.8, creatinine 2.49 01/07/2016 K 4.9 Creatinine 2.93  02/07/16 K 3.5, Creatinine 1.26  FH: Brother also has CAD and afib, Mother had CABG but did well, Father passed away from MI at age 34 thought to be 2/2 to heavy ETOH use.  SH:  Pt has a distant history of smoking. Pt has 50-60 pack years up to 2ppd. Quit in 1982.   Past Medical History:  Diagnosis Date  . AKI (acute kidney injury) ([place]) 01/2016  . ANEMIA-NOS 08/20/2007  . ANXIETY 03/16/2007  . BREAST CANCER, HX OF 03/16/2007  . CARPAL TUNNEL SYNDROME, RIGHT 01/11/2008  . Cataract    floaters  . CELLULITIS, LEG, RIGHT 08/20/2007  . Collagenous colitis   . COPD (chronic obstructive pulmonary disease) ([place])   . DIVERTICULOSIS, COLON 08/20/2007  . GOITER, MULTINODULAR 05/06/2010   Dr [name]  . HYPERLIPIDEMIA 08/20/2007  . HYPERTENSION 03/16/2007  . HYPERTHYROIDISM 05/23/2010  . IBS (irritable bowel syndrome)   . OSTEOARTHRITIS 03/16/2007  . OSTEOPOROSIS 08/20/2007  . Pneumonia 07/2011   took abx for several weeks  . PREMATURE ATRIAL CONTRACTIONS 09/21/2010  . SCOLIOSIS 01/11/2008  . SKIN CANCER, HX OF 11/12/2008    R cheek 2010, L Cheek 2011 Dr. Tonia Brooms  . Thyroid nodule   . VENOUS INSUFFICIENCY 09/05/2007    Current Outpatient Prescriptions  Medication Sig Dispense Refill  . ALPRAZolam (XANAX) 0.25 MG tablet Take 1 tablet (0.25 mg total) by  mouth at bedtime. 15 tablet 0  . ALPRAZolam (XANAX) 0.5 MG tablet Take 1 tablet (0.5 mg total) by mouth 2 (two) times daily as needed for anxiety. 20 tablet 0  . Calcium Carbonate-Vitamin D (CALCIUM 600+D) 600-400 MG-UNIT per tablet Take 1 tablet by mouth daily.    . Fluticasone-Salmeterol (ADVAIR DISKUS) 250-50 MCG/DOSE AEPB Inhale 1 puff into the lungs 2 (two) times daily. 60 each 3  . Multiple Vitamins-Minerals (MULTIVITAMIN PO) Take 1 tablet by mouth daily.     . mupirocin ointment  (BACTROBAN) 2 % Use qd (Patient taking differently: Apply 1 application topically daily. ) 30 g 2  . torsemide (DEMADEX) 20 MG tablet Take 2 tablets (40 mg total) by mouth 2 (two) times daily. 120 tablet 6  . vitamin C (ASCORBIC ACID) 500 MG tablet Take 1 tablet (500 mg total) by mouth daily. 100 tablet 3   No current facility-administered medications for this encounter.     Allergies  Allergen Reactions  . Cefuroxime Diarrhea  . Celecoxib Nausea Only  . Deltasone [Prednisone]     Oral deltasone is causing swelling (can use Depo-Medrol)  . Ranitidine     bloating  . Tramadol Hcl Nausea Only      Social History   Social History  . Marital status: Single    Spouse name: N/A  . Number of children: N/A  . Years of education: N/A   Occupational History  . Not on file.   Social History Main Topics  . Smoking status: Former Smoker    Quit date: 12/20/1980  . Smokeless tobacco: Never Used  . Alcohol use Yes     Comment: socially  . Drug use: No  . Sexual activity: Not Currently   Other Topics Concern  . Not on file   Social History Narrative  . No narrative on file      Family History  Problem Relation Age of Onset  . Dementia Mother   . Heart disease Mother   . Mental retardation Mother   . Hypertension Mother   . Alzheimer's disease Mother   . Heart attack Father   . Alcohol abuse Father   . Prostate cancer Brother   . Diabetes Other   . Colon cancer Neg Hx   . Anesthesia problems Neg Hx     Vitals:   06/15/16 1133  BP: (!) 104/56  Pulse: 64  SpO2: 97%  Weight: 121 lb 8 oz (55.1 kg)   Wt Readings from Last 3 Encounters:  06/15/16 121 lb 8 oz (55.1 kg)  06/11/16 129 lb 10.1 oz (58.8 kg)  05/12/16 118 lb (53.5 kg)    PHYSICAL EXAM: General:  Elderly. Sitting in [place]. NAD.  HEENT: normal Neck: supple. JVP 7-8 Carotids 2+ bilat; no bruits. No thyromegaly or nodule noted.  Cor: PMI nondisplaced. Irregular rate & rhythm. No M/G/R Lungs: Clear, normal  effort Abdomen: soft, NT, ND, no HSM. No bruits or masses. +BS  Extremities: no cyanosis, clubbing, rash. Legs wrapped in [place], 1+ edema intro knees/thighs above leg wraps. LUE with swelling Neuro: alert & oriented x 3, cranial nerves grossly intact. moves all 4 extremities w/o difficulty. Affect pleasant.  ASSESSMENT & PLAN: 1. Chronic diastolic HF with R>>L symptoms. ECHO 11/2015 EF 45-50%.  NYHA III. 2. AKI on CKD stage III 3. LE wounds- Followed at the wound center.  4.  H/o COPD 5. H/o breast cancer s/p chemo 2000 6. Chronic A fib- Rate controlled. On Eliquis twice a day.  -  This patients CHA2DS2-VASc Score is 6. 7. Falls- Remains in SNF - [name] 8. LUE DVT  - Back on Eliquis. Keep elevated. Treatment per primary team.  Mildly volume overloaded but otherwise stable from cardiac perspective. Can give extra torsemide if weight going up. Off b-blocker and ACE with hyperkalemia and low BP.   We discussed Code Status and we have decided on DNR/DNI. We have filled out Yellow form.   Bensimhon, Daniel,MD 12:06 PM

## 2016-06-15 NOTE — Patient Instructions (Signed)
Continue current regimen.   Follow up with Dr.Bensimhon in 6 months.

## 2016-06-16 DIAGNOSIS — I4891 Unspecified atrial fibrillation: Secondary | ICD-10-CM | POA: Diagnosis not present

## 2016-06-16 DIAGNOSIS — I5023 Acute on chronic systolic (congestive) heart failure: Secondary | ICD-10-CM | POA: Diagnosis not present

## 2016-06-16 DIAGNOSIS — L97929 Non-pressure chronic ulcer of unspecified part of left lower leg with unspecified severity: Secondary | ICD-10-CM | POA: Diagnosis not present

## 2016-06-16 DIAGNOSIS — I82619 Acute embolism and thrombosis of superficial veins of unspecified upper extremity: Secondary | ICD-10-CM | POA: Diagnosis not present

## 2016-06-19 DIAGNOSIS — L97929 Non-pressure chronic ulcer of unspecified part of left lower leg with unspecified severity: Secondary | ICD-10-CM | POA: Diagnosis not present

## 2016-06-19 DIAGNOSIS — I4891 Unspecified atrial fibrillation: Secondary | ICD-10-CM | POA: Diagnosis not present

## 2016-06-19 DIAGNOSIS — I82619 Acute embolism and thrombosis of superficial veins of unspecified upper extremity: Secondary | ICD-10-CM | POA: Diagnosis not present

## 2016-06-19 DIAGNOSIS — I5023 Acute on chronic systolic (congestive) heart failure: Secondary | ICD-10-CM | POA: Diagnosis not present

## 2016-06-27 DIAGNOSIS — J449 Chronic obstructive pulmonary disease, unspecified: Secondary | ICD-10-CM | POA: Diagnosis not present

## 2016-06-27 DIAGNOSIS — I4891 Unspecified atrial fibrillation: Secondary | ICD-10-CM | POA: Diagnosis not present

## 2016-06-27 DIAGNOSIS — L97929 Non-pressure chronic ulcer of unspecified part of left lower leg with unspecified severity: Secondary | ICD-10-CM | POA: Diagnosis not present

## 2016-06-27 DIAGNOSIS — I5023 Acute on chronic systolic (congestive) heart failure: Secondary | ICD-10-CM | POA: Diagnosis not present

## 2016-06-29 ENCOUNTER — Encounter (HOSPITAL_BASED_OUTPATIENT_CLINIC_OR_DEPARTMENT_OTHER): Payer: No Typology Code available for payment source | Attending: Internal Medicine

## 2016-06-29 DIAGNOSIS — L97211 Non-pressure chronic ulcer of right calf limited to breakdown of skin: Secondary | ICD-10-CM | POA: Diagnosis not present

## 2016-06-29 DIAGNOSIS — Z9221 Personal history of antineoplastic chemotherapy: Secondary | ICD-10-CM | POA: Diagnosis not present

## 2016-06-29 DIAGNOSIS — I87331 Chronic venous hypertension (idiopathic) with ulcer and inflammation of right lower extremity: Secondary | ICD-10-CM | POA: Diagnosis not present

## 2016-06-29 DIAGNOSIS — Z923 Personal history of irradiation: Secondary | ICD-10-CM | POA: Insufficient documentation

## 2016-06-29 DIAGNOSIS — L97821 Non-pressure chronic ulcer of other part of left lower leg limited to breakdown of skin: Secondary | ICD-10-CM | POA: Diagnosis not present

## 2016-06-29 DIAGNOSIS — J449 Chronic obstructive pulmonary disease, unspecified: Secondary | ICD-10-CM | POA: Insufficient documentation

## 2016-06-29 DIAGNOSIS — I87313 Chronic venous hypertension (idiopathic) with ulcer of bilateral lower extremity: Secondary | ICD-10-CM | POA: Diagnosis not present

## 2016-06-29 DIAGNOSIS — I10 Essential (primary) hypertension: Secondary | ICD-10-CM | POA: Insufficient documentation

## 2016-06-29 DIAGNOSIS — L97811 Non-pressure chronic ulcer of other part of right lower leg limited to breakdown of skin: Secondary | ICD-10-CM | POA: Diagnosis not present

## 2016-07-06 DIAGNOSIS — L97929 Non-pressure chronic ulcer of unspecified part of left lower leg with unspecified severity: Secondary | ICD-10-CM | POA: Diagnosis not present

## 2016-07-06 DIAGNOSIS — I10 Essential (primary) hypertension: Secondary | ICD-10-CM | POA: Diagnosis not present

## 2016-07-06 DIAGNOSIS — I5023 Acute on chronic systolic (congestive) heart failure: Secondary | ICD-10-CM | POA: Diagnosis not present

## 2016-07-06 DIAGNOSIS — I4891 Unspecified atrial fibrillation: Secondary | ICD-10-CM | POA: Diagnosis not present

## 2016-07-07 ENCOUNTER — Ambulatory Visit: Payer: Medicare Other | Admitting: Internal Medicine

## 2016-07-10 DIAGNOSIS — C50919 Malignant neoplasm of unspecified site of unspecified female breast: Secondary | ICD-10-CM | POA: Diagnosis not present

## 2016-07-10 DIAGNOSIS — I5043 Acute on chronic combined systolic (congestive) and diastolic (congestive) heart failure: Secondary | ICD-10-CM | POA: Diagnosis not present

## 2016-07-10 DIAGNOSIS — E876 Hypokalemia: Secondary | ICD-10-CM | POA: Diagnosis not present

## 2016-07-10 DIAGNOSIS — J9601 Acute respiratory failure with hypoxia: Secondary | ICD-10-CM | POA: Diagnosis not present

## 2016-07-10 DIAGNOSIS — E43 Unspecified severe protein-calorie malnutrition: Secondary | ICD-10-CM | POA: Diagnosis not present

## 2016-07-10 DIAGNOSIS — I480 Paroxysmal atrial fibrillation: Secondary | ICD-10-CM | POA: Diagnosis not present

## 2016-07-10 DIAGNOSIS — R945 Abnormal results of liver function studies: Secondary | ICD-10-CM | POA: Diagnosis not present

## 2016-07-10 DIAGNOSIS — D6489 Other specified anemias: Secondary | ICD-10-CM | POA: Diagnosis not present

## 2016-07-10 DIAGNOSIS — J449 Chronic obstructive pulmonary disease, unspecified: Secondary | ICD-10-CM | POA: Diagnosis not present

## 2016-07-10 DIAGNOSIS — I82629 Acute embolism and thrombosis of deep veins of unspecified upper extremity: Secondary | ICD-10-CM | POA: Diagnosis not present

## 2016-07-10 DIAGNOSIS — N184 Chronic kidney disease, stage 4 (severe): Secondary | ICD-10-CM | POA: Diagnosis not present

## 2016-07-11 ENCOUNTER — Other Ambulatory Visit: Payer: Self-pay | Admitting: *Deleted

## 2016-07-11 NOTE — Patient Outreach (Signed)
[facility]) Care Management  07/11/2016  Mia Contreras 1928-09-16 [id number]   Spoke with patient at bedside regarding [facility] program. Patient states she has had [facility] program services in the past. Patient states that Hospice had spoken to her about their services, but she does not want to  Go under hospice care at this time.  She agrees to [place] services again.  She also wants to have [facility] care for home care services upon discharge.   Patient states her closest family is a nephew in [place], she states her niece by marriage would be an emergency contact (see consent). Her brother, who lives in [place] is her [place].  Patient does have supportive friends and church family  Patient reports a fall at the facility. She was checked out and is doing ok now. Patient has life alert alarm when at home.    Spoke with Jacqlyn Larsen, discharge planner at facility, discussed that patient requests [facility] at discharge and that she will also have [facility] program services. Requested that she contact RNCM when discharge date set. Anticipated discharge for next week.  RNCM will place order for [facility] community for transition of care program upon discharge from facility  [place]. Laymond Purser, RN, BSN, [facility] Care Coordinator [phone]

## 2016-07-12 IMAGING — CR DG CHEST 2V
2 series · 2 of 2 positions shown · non-contrast
Comparison: 12/15/2015

CLINICAL DATA: Shortness of breath, weakness

EXAM:
CHEST  2 VIEW

[chest pa]
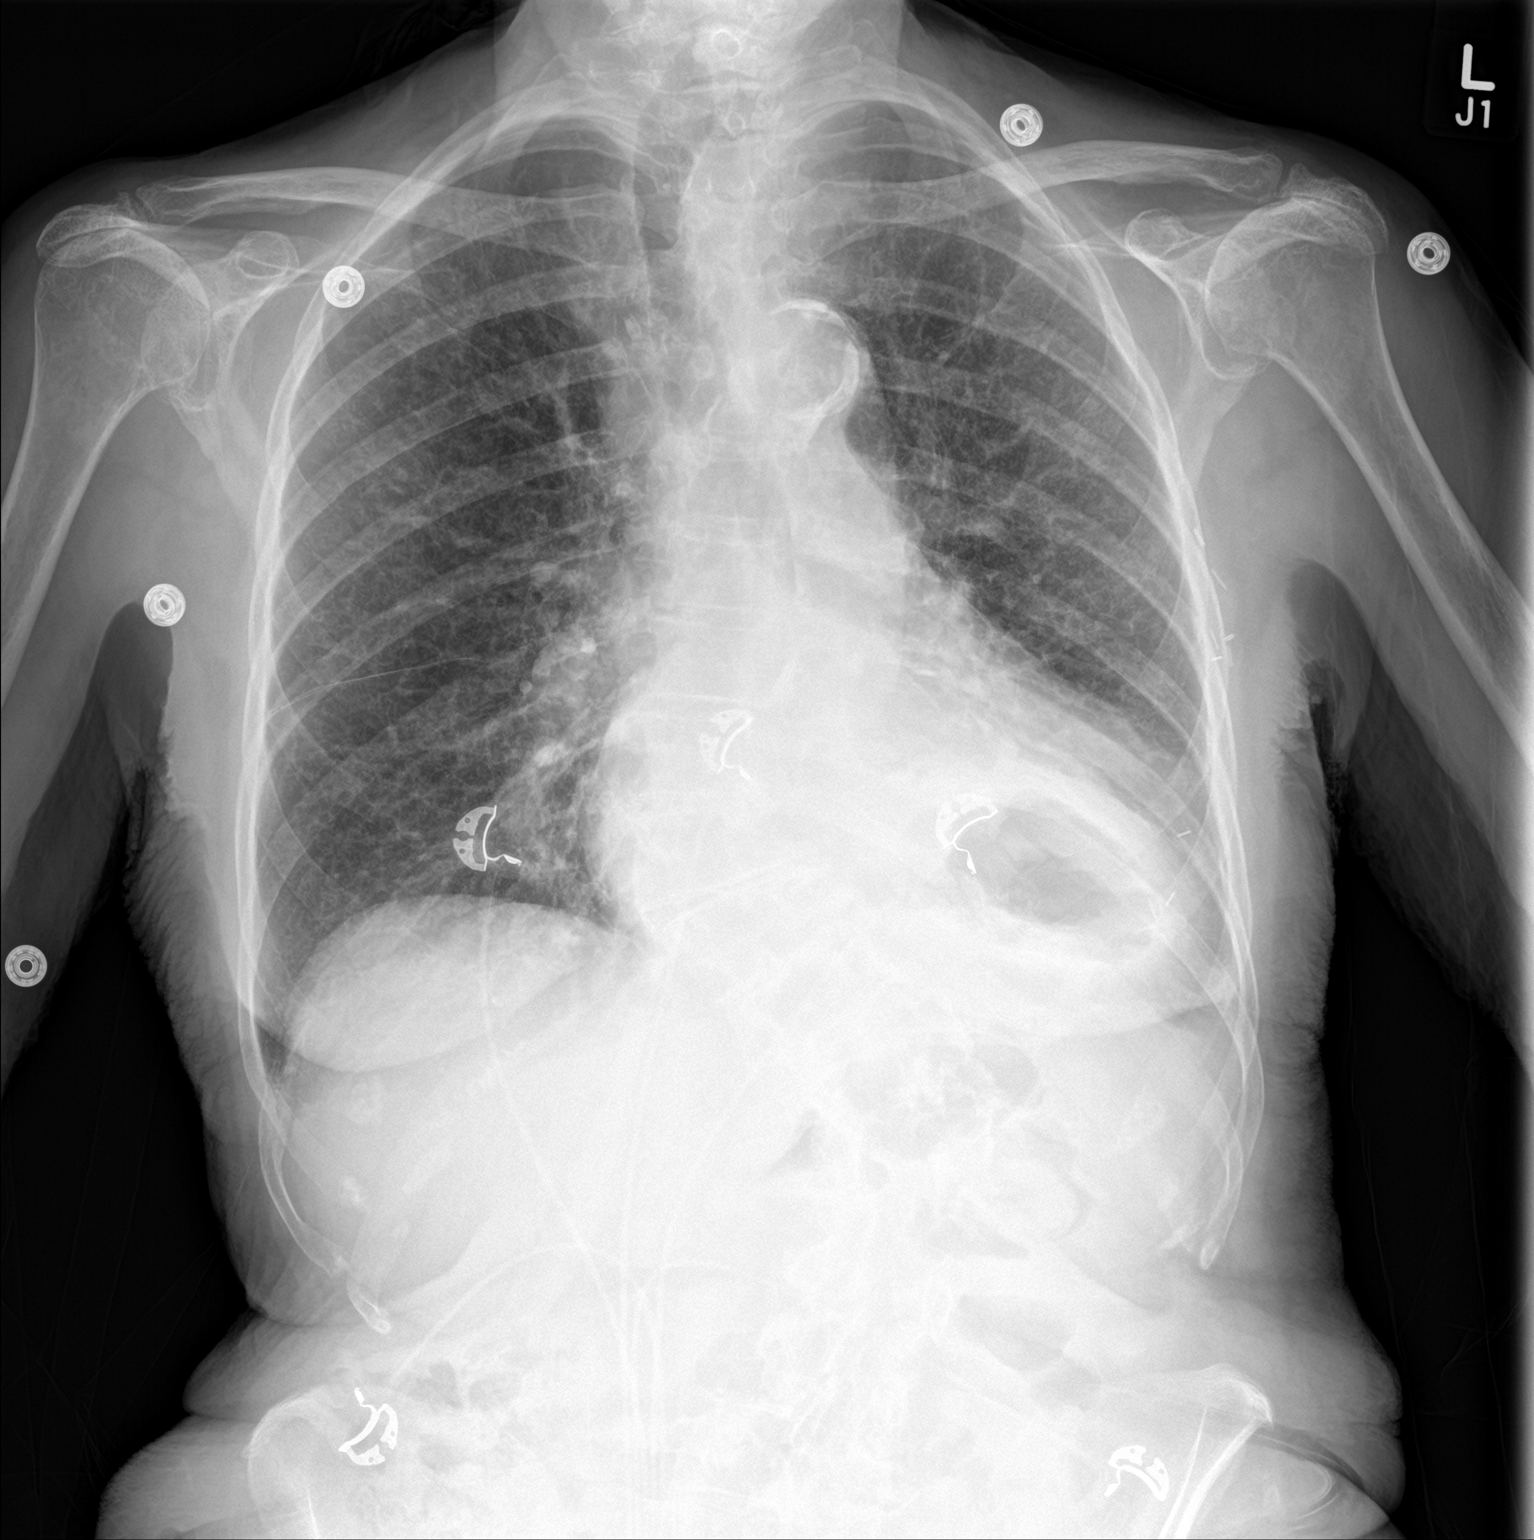

[chest lat]
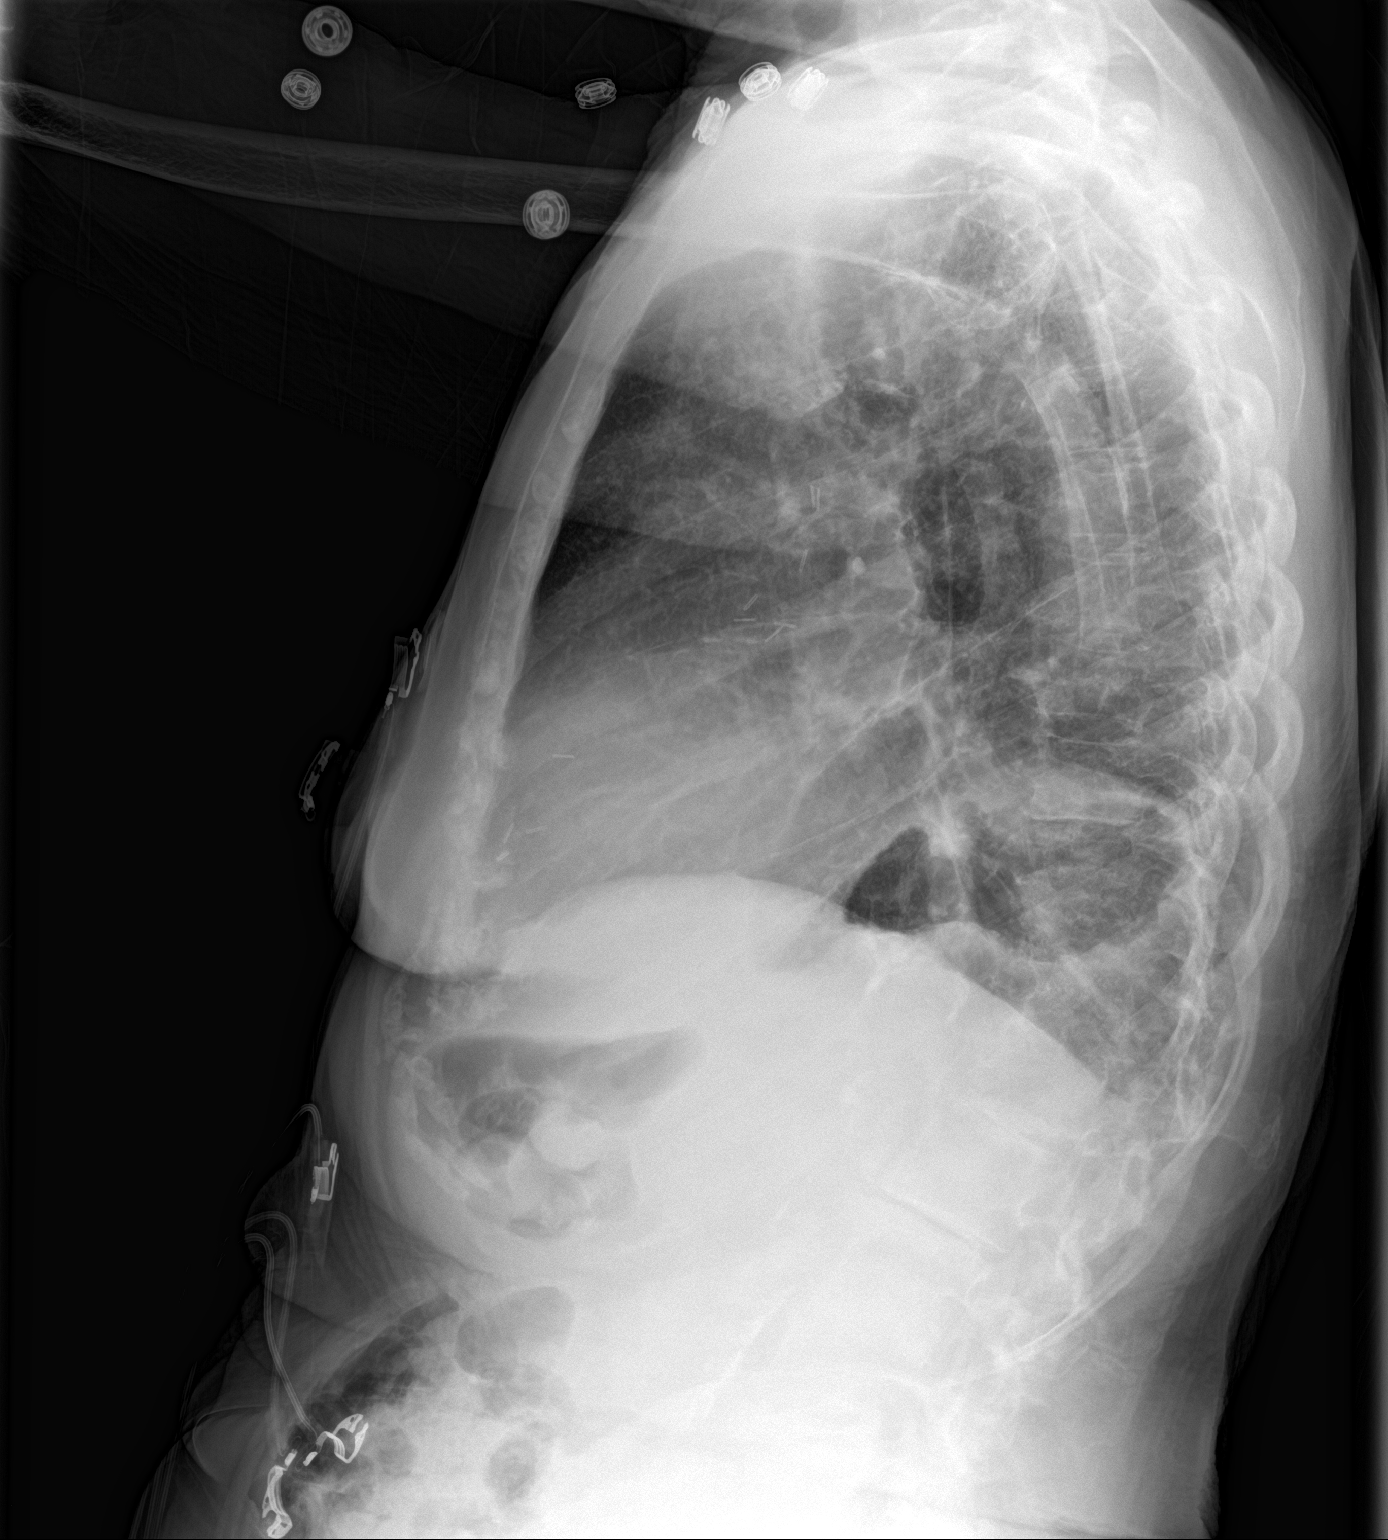

[2 of 2 positions shown; findings below may reference images not displayed]

FINDINGS: Cardiomediastinal silhouette is stable. There is dextroscoliosis
lower thoracic spine. Significant levoscoliosis lumbar spine. Mild
hyperinflation. Mild emphysematous changes and chronic interstitial
prominence. Persistent left basilar atelectasis. Surgical clips are
noted in left axilla. Again noted large left retrocardiac
diaphragmatic/ hiatal hernia. No segmental infiltrate or pulmonary
edema.
IMPRESSION: Mild hyperinflation. Mild emphysematous changes and chronic
interstitial prominence. Persistent left basilar atelectasis.
Surgical clips are noted in left axilla. Again noted large left
retrocardiac diaphragmatic/ hiatal hernia. No segmental infiltrate
or pulmonary edema.

## 2016-07-14 IMAGING — DX DG CHEST 1V PORT
1 series · 1 of 1 positions shown · non-contrast
Comparison: 01/27/2016

CLINICAL DATA: Pt reports shakiness and hypertension this AM and
dry cough x 24 hours;

EXAM:
PORTABLE CHEST 1 VIEW

[chest ap]
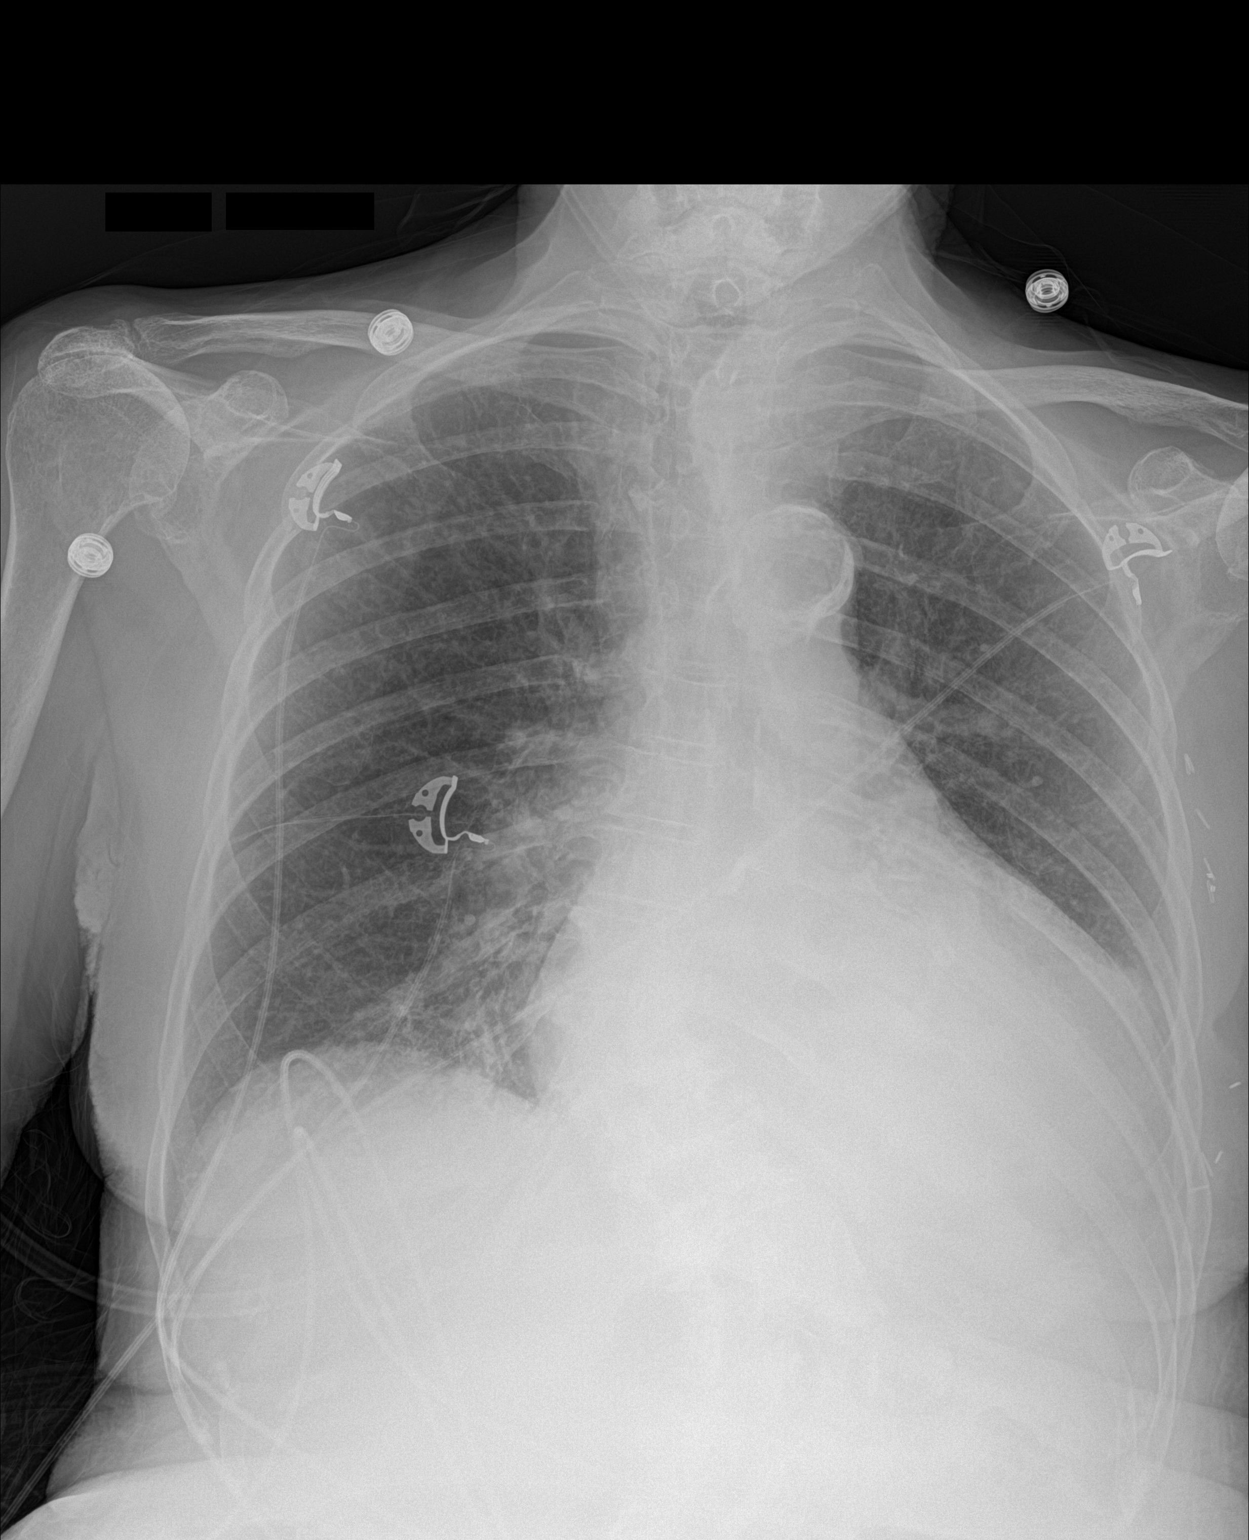

[1 of 1 positions shown; findings below may reference images not displayed]

FINDINGS: Cardiac enlargement stable. Right lung clear. Increased retrocardiac
opacity when compared to prior study.
IMPRESSION: Increased retrocardiac opacity when compared to prior study
suggesting consolidation and possibly effusion.

## 2016-07-16 IMAGING — DX DG CHEST 2V
2 series · 2 of 2 positions shown · non-contrast
Comparison: January 29, 2016 chest radiograph; chest CT December 01, 2006

CLINICAL DATA: Shortness of Breath.  Cough for 3 days

EXAM:
CHEST  2 VIEW

[x chest ap]
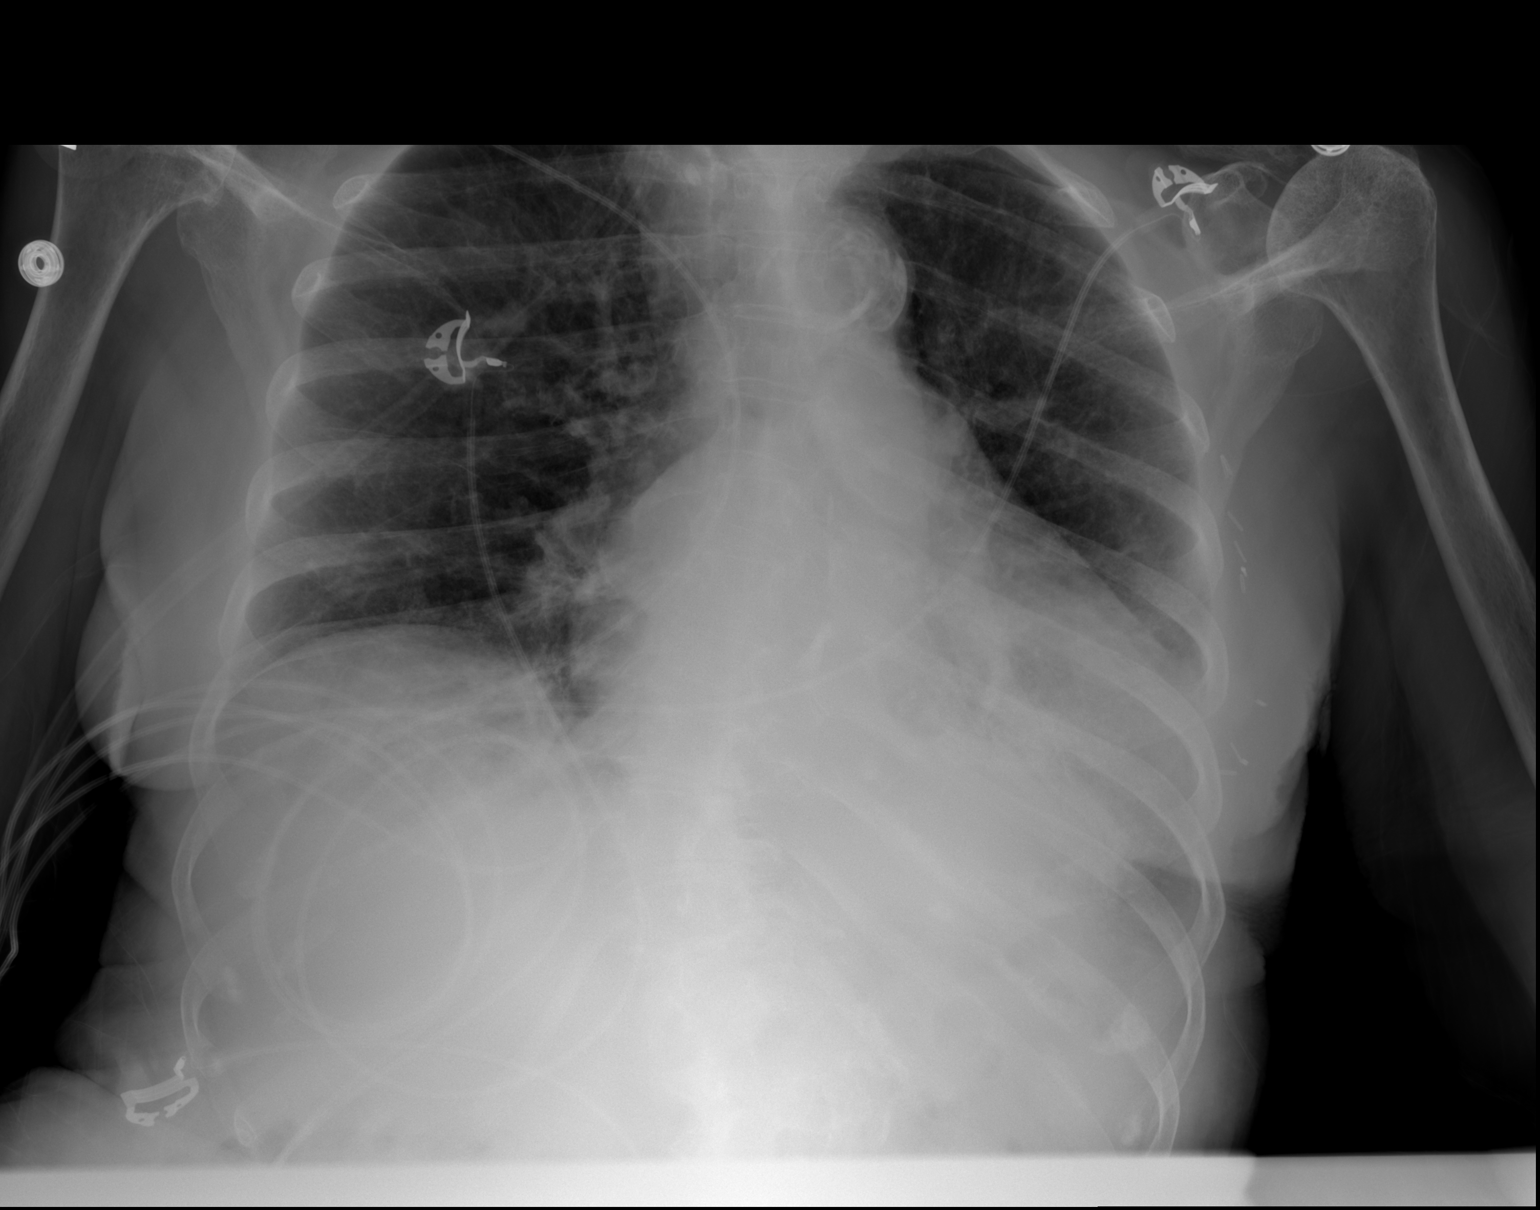

[w chest lat]
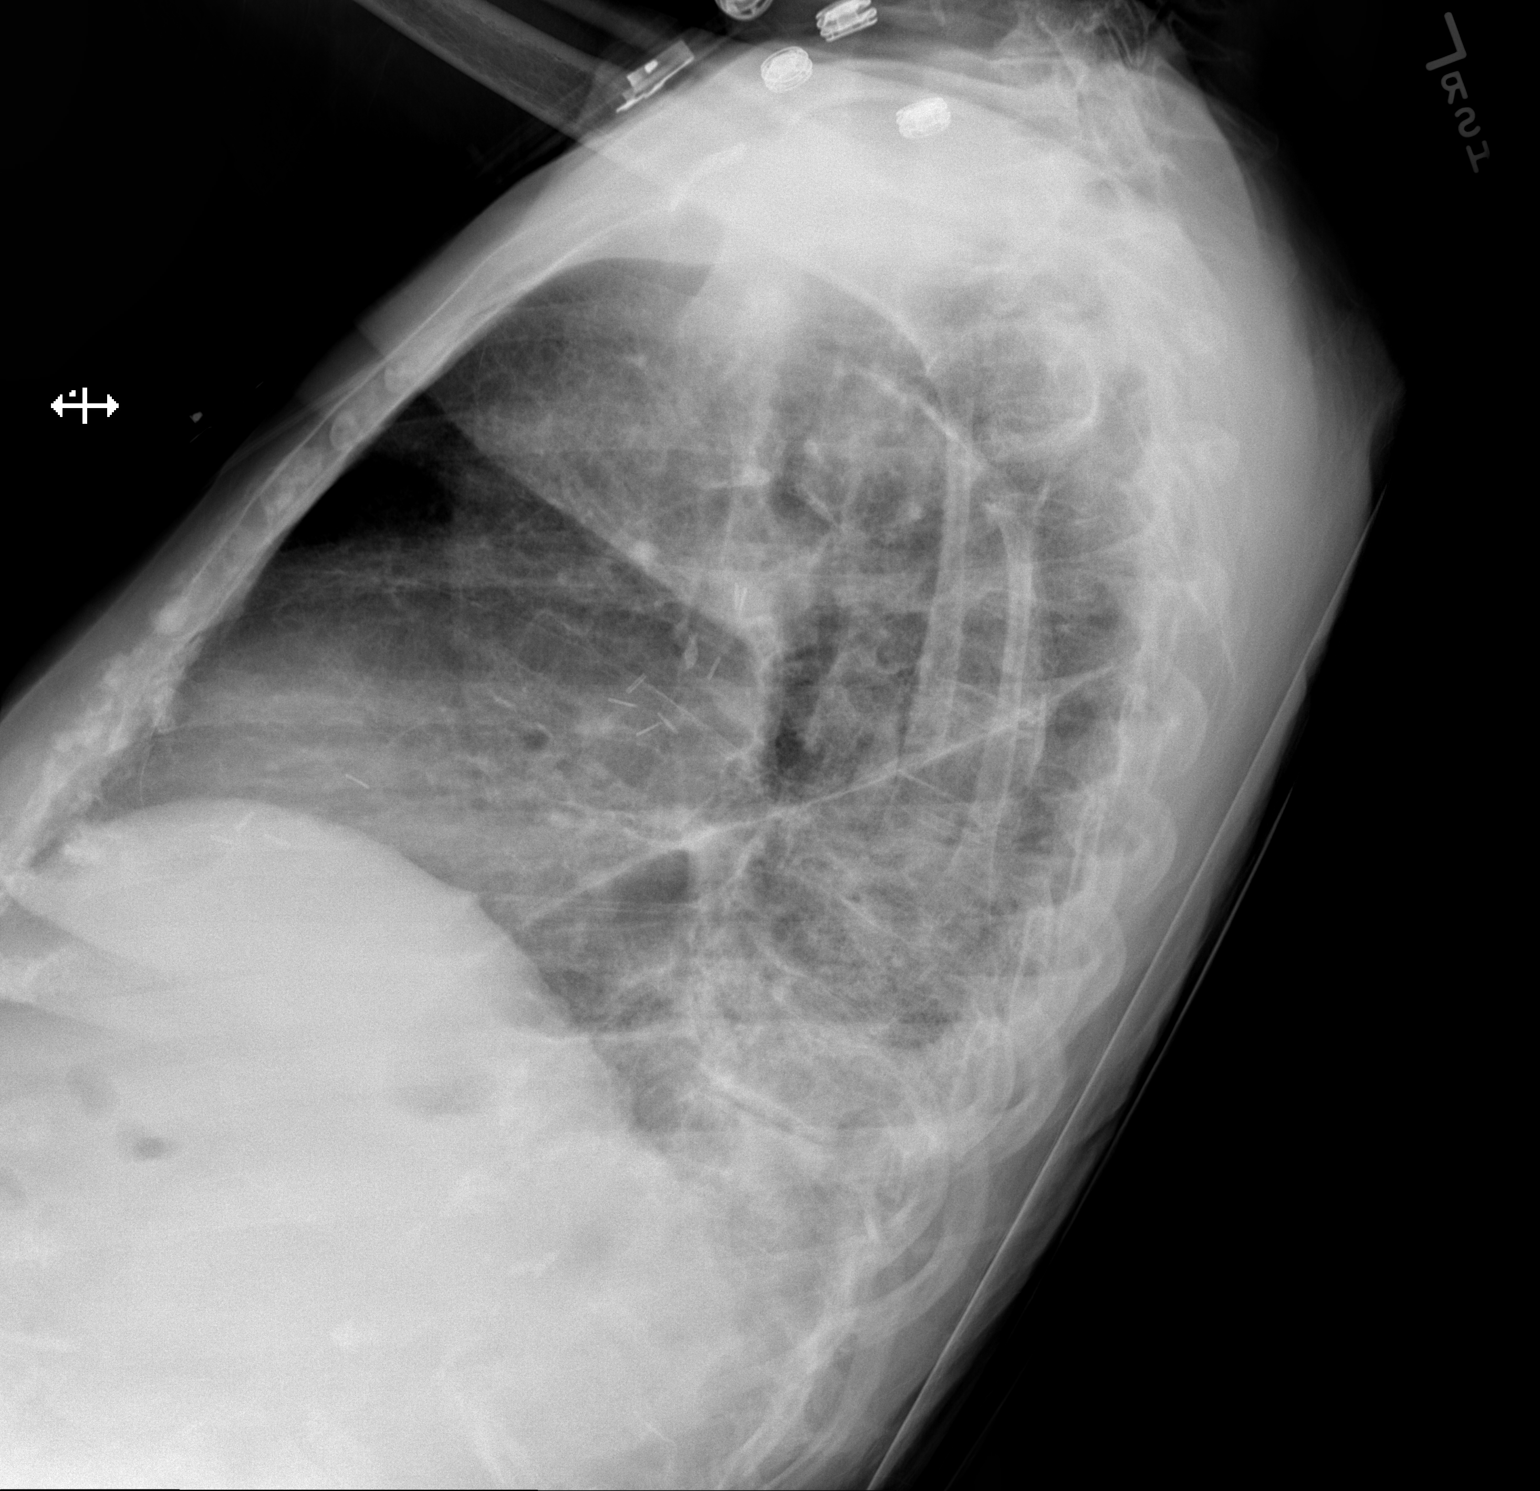

[2 of 2 positions shown; findings below may reference images not displayed]

FINDINGS: There is patchy atelectasis and consolidation in the posterior left
base region, not felt to be significantly changed. Lungs elsewhere
clear. Heart is mildly enlarged with pulmonary vascularity within
normal limits. There is atherosclerotic calcification aorta. There
are surgical clips in left axillary region. There is rightward
deviation of the upper thoracic trachea.
IMPRESSION: Stable atelectasis and infiltrate posterior left base. Lungs
elsewhere clear. Stable cardiac prominence. Extensive
atherosclerotic calcification aorta. Rightward deviation of the
upper thoracic trachea. Finding consistent with focal thyroid
enlargement in this area.

## 2016-07-16 NOTE — Progress Notes (Signed)
Patient ID: Mia Contreras, female   DOB: March 24, 1929, 82 y.o.   MRN: [id number]      Referring Physician: Dr Johnsie Cancel Primary Care: Arlana [place], MD Primary Cardiologist: Dr Johnsie Cancel Primary HF: Dr. Haroldine Laws  HPI: TAMETRA AHART is a 82 y.o. female with a history of nonischemic cardiomyopathy, systolic CHF EF 50-53% in 2016 (echo 45-50% in 11/2015), PACs, L Breast cancer in 2000 with surgery and chemo.  She has never had a cath. Had 4 rounds of chemo for breast CA. Doesn't remember which agents she got. Does not recall a red medicine.   Admitted 4/25 -12/19/15 with A/C combined CHF and AKI. Creatinine improved.  She diuresed 12 L and down 19 lbs from highest weight that admission. Discharge weight 134 lbs.   Admitted 6/7 -> 02/07/16 with volume overload. Diuresed 10 lbs on IV lasix and transitioned to torsemide for home. Completed course of levaquin for left basilar infiltrates on CXR.  Was discharged to [facility] due to frequent falls at home. Discharge weight 125 lbs.   Was admitted last week (10/17) with severe symptomatic hyperkalemia (K > 7.5) and ARF (Creatinine 5.3). Was initially bradycardic with HRs in 20s. Spiro and potassium stopped. Treated with fluids and bicarb and recovered. On d/c 10/22 K 4.4 Cr 2.2 Discussed Palliative Care dicsussed but not pursued. B-blocker stopped.    She presents today for post hospital follow up.  She remains at [facility]. Tells me that she was diagnosed with LUE clot at site of IV and Eliquis started. Weight stable. Has restarted PT and now doing exercise bike. Going home on Monday   11/2015 ECHO EF 45-50%  12/16/14  ECHO Normal LV size with EF 30-35%, diffuse hypokinesis. Normal RVsize with mildly decreased systolic function. Moderate to severeLAE. Mild MR. Moderate pulmonary hypertension.  Recent labs: 12/01/15: Sodium 140 K 5.0 creatinine 2.5 (was 1.3 in 3/17). Albumin 3.8 BNP 1090 12/19/15: K 4.0, Creatinine 1.67 12/28/15: K 5.4, Creatinine  2.26 12/31/15 K 4.8, creatinine 2.49 01/07/2016 K 4.9 Creatinine 2.93  02/07/16 K 3.5, Creatinine 1.26  FH: Brother also has CAD and afib, Mother had CABG but did well, Father passed away from MI at age 43 thought to be 2/2 to heavy ETOH use.  SH:  Pt has a distant history of smoking. Pt has 50-60 pack years up to 2ppd. Quit in 1982.   Past Medical History:  Diagnosis Date  . AKI (acute kidney injury) ([place]) 01/2016  . ANEMIA-NOS 08/20/2007  . ANXIETY 03/16/2007  . BREAST CANCER, HX OF 03/16/2007  . CARPAL TUNNEL SYNDROME, RIGHT 01/11/2008  . Cataract    floaters  . CELLULITIS, LEG, RIGHT 08/20/2007  . Collagenous colitis   . COPD (chronic obstructive pulmonary disease) ([place])   . DIVERTICULOSIS, COLON 08/20/2007  . GOITER, MULTINODULAR 05/06/2010   Dr [name]  . HYPERLIPIDEMIA 08/20/2007  . HYPERTENSION 03/16/2007  . HYPERTHYROIDISM 05/23/2010  . IBS (irritable bowel syndrome)   . OSTEOARTHRITIS 03/16/2007  . OSTEOPOROSIS 08/20/2007  . Pneumonia 07/2011   took abx for several weeks  . PREMATURE ATRIAL CONTRACTIONS 09/21/2010  . SCOLIOSIS 01/11/2008  . SKIN CANCER, HX OF 11/12/2008    R cheek 2010, L Cheek 2011 Dr. Tonia Brooms  . Thyroid nodule   . VENOUS INSUFFICIENCY 09/05/2007    Current Outpatient Prescriptions  Medication Sig Dispense Refill  . ALPRAZolam (XANAX) 0.25 MG tablet Take 1 tablet (0.25 mg total) by mouth at bedtime. 15 tablet 0  . ALPRAZolam (XANAX) 0.5 MG tablet Take 1  tablet (0.5 mg total) by mouth 2 (two) times daily as needed for anxiety. 20 tablet 0  . apixaban (ELIQUIS) 2.5 MG TABS tablet Take 2.5 mg by mouth 2 (two) times daily.    . Calcium Carbonate-Vitamin D (CALCIUM 600+D) 600-400 MG-UNIT per tablet Take 1 tablet by mouth daily.    . Fluticasone-Salmeterol (ADVAIR DISKUS) 250-50 MCG/DOSE AEPB Inhale 1 puff into the lungs 2 (two) times daily. 60 each 3  . Multiple Vitamins-Minerals (MULTIVITAMIN PO) Take 1 tablet by mouth daily.     Marland Kitchen torsemide (DEMADEX) 20 MG  tablet Take 2 tablets (40 mg total) by mouth 2 (two) times daily. 120 tablet 6  . vitamin C (ASCORBIC ACID) 500 MG tablet Take 1 tablet (500 mg total) by mouth daily. 100 tablet 3   No current facility-administered medications for this visit.     Allergies  Allergen Reactions  . Cefuroxime Diarrhea  . Celecoxib Nausea Only  . Deltasone [Prednisone]     Oral deltasone is causing swelling (can use Depo-Medrol)  . Ranitidine     bloating  . Tramadol Hcl Nausea Only      Social History   Social History  . Marital status: Single    Spouse name: N/A  . Number of children: N/A  . Years of education: N/A   Occupational History  . Not on file.   Social History Main Topics  . Smoking status: Former Smoker    Quit date: 12/20/1980  . Smokeless tobacco: Never Used  . Alcohol use Yes     Comment: socially  . Drug use: No  . Sexual activity: Not Currently   Other Topics Concern  . Not on file   Social History Narrative  . No narrative on file      Family History  Problem Relation Age of Onset  . Dementia Mother   . Heart disease Mother   . Mental retardation Mother   . Hypertension Mother   . Alzheimer's disease Mother   . Heart attack Father   . Alcohol abuse Father   . Prostate cancer Brother   . Diabetes Other   . Colon cancer Neg Hx   . Anesthesia problems Neg Hx     Vitals:   07/19/16 1048  BP: 132/60  Pulse: 72  SpO2: 92%  Weight: 52.3 kg (115 lb 6.4 oz)  Height: 5' (1.524 m)   Wt Readings from Last 3 Encounters:  07/19/16 52.3 kg (115 lb 6.4 oz)  06/15/16 55.1 kg (121 lb 8 oz)  06/11/16 58.8 kg (129 lb 10.1 oz)    PHYSICAL EXAM: General:  Elderly. Sitting in [place]. NAD.  HEENT: normal Neck: supple. JVP 7-8 Carotids 2+ bilat; no bruits. No thyromegaly or nodule noted.  Cor: PMI nondisplaced. Irregular rate & rhythm. No M/G/R Lungs: Clear, normal effort Abdomen: soft, NT, ND, no HSM. No bruits or masses. +BS  Extremities: no cyanosis, clubbing,  rash. Legs wrapped in [place], 1+ edema intro knees/thighs above leg wraps. LUE with swelling Neuro: alert & oriented x 3, cranial nerves grossly intact. moves all 4 extremities w/o difficulty. Affect pleasant.  ASSESSMENT & PLAN: 1. Chronic diastolic HF with R>>L symptoms. ECHO 11/2015 EF 45-50%.  NYHA III. 2. AKI on CKD stage III 3. LE wounds- Followed at the wound center.  4.  H/o COPD 5. H/o breast cancer s/p chemo 2000 6. Chronic A fib- Rate controlled. On Eliquis twice a day.  -This patients CHA2DS2-VASc Score is 6. 7. Falls- Remains in  SNF - [name] 8. LUE DVT  - Back on Eliquis.    Can give extra torsemide if weight going up. Off b-blocker and ACE with hyperkalemia and low BP.   Code status DNR/DNI per Dr Dorothe Pea Maximilian Tallo,MD 11:07 AM

## 2016-07-19 ENCOUNTER — Encounter: Payer: Self-pay | Admitting: Cardiovascular Disease

## 2016-07-19 ENCOUNTER — Ambulatory Visit (INDEPENDENT_AMBULATORY_CARE_PROVIDER_SITE_OTHER): Payer: Medicare Other | Admitting: Cardiovascular Disease

## 2016-07-19 VITALS — BP 132/60 | HR 72 | Ht 60.0 in | Wt 115.4 lb

## 2016-07-19 DIAGNOSIS — Z79899 Other long term (current) drug therapy: Secondary | ICD-10-CM | POA: Diagnosis not present

## 2016-07-19 DIAGNOSIS — I5032 Chronic diastolic (congestive) heart failure: Secondary | ICD-10-CM

## 2016-07-19 NOTE — Patient Instructions (Signed)

## 2016-07-20 ENCOUNTER — Other Ambulatory Visit: Payer: Self-pay | Admitting: *Deleted

## 2016-07-20 DIAGNOSIS — I87333 Chronic venous hypertension (idiopathic) with ulcer and inflammation of bilateral lower extremity: Secondary | ICD-10-CM | POA: Diagnosis not present

## 2016-07-20 DIAGNOSIS — I10 Essential (primary) hypertension: Secondary | ICD-10-CM | POA: Diagnosis not present

## 2016-07-20 DIAGNOSIS — Z9221 Personal history of antineoplastic chemotherapy: Secondary | ICD-10-CM | POA: Diagnosis not present

## 2016-07-20 DIAGNOSIS — J449 Chronic obstructive pulmonary disease, unspecified: Secondary | ICD-10-CM | POA: Diagnosis not present

## 2016-07-20 DIAGNOSIS — L97811 Non-pressure chronic ulcer of other part of right lower leg limited to breakdown of skin: Secondary | ICD-10-CM | POA: Diagnosis not present

## 2016-07-20 DIAGNOSIS — S81801D Unspecified open wound, right lower leg, subsequent encounter: Secondary | ICD-10-CM | POA: Diagnosis not present

## 2016-07-20 DIAGNOSIS — L97213 Non-pressure chronic ulcer of right calf with necrosis of muscle: Secondary | ICD-10-CM | POA: Diagnosis not present

## 2016-07-20 DIAGNOSIS — L97211 Non-pressure chronic ulcer of right calf limited to breakdown of skin: Secondary | ICD-10-CM | POA: Diagnosis not present

## 2016-07-20 DIAGNOSIS — I87331 Chronic venous hypertension (idiopathic) with ulcer and inflammation of right lower extremity: Secondary | ICD-10-CM | POA: Diagnosis not present

## 2016-07-20 DIAGNOSIS — S81001D Unspecified open wound, right knee, subsequent encounter: Secondary | ICD-10-CM | POA: Diagnosis not present

## 2016-07-20 NOTE — Patient Outreach (Signed)
Telephone outreach. I spoke to Mia Contreras, who is still at [facility] for rehab. She plans on going home Monday, December 4th. I told her I would be working with her and will call her on Tuesday next week. She is in agreement with this plan.  Deloria Lair [facility] [place] [phone]

## 2016-07-24 DIAGNOSIS — J449 Chronic obstructive pulmonary disease, unspecified: Secondary | ICD-10-CM | POA: Diagnosis not present

## 2016-07-24 DIAGNOSIS — I4891 Unspecified atrial fibrillation: Secondary | ICD-10-CM | POA: Diagnosis not present

## 2016-07-24 DIAGNOSIS — L97929 Non-pressure chronic ulcer of unspecified part of left lower leg with unspecified severity: Secondary | ICD-10-CM | POA: Diagnosis not present

## 2016-07-24 DIAGNOSIS — I5023 Acute on chronic systolic (congestive) heart failure: Secondary | ICD-10-CM | POA: Diagnosis not present

## 2016-07-25 ENCOUNTER — Other Ambulatory Visit: Payer: Self-pay | Admitting: *Deleted

## 2016-07-25 NOTE — Patient Outreach (Signed)
Transition of care call. Pt returned home from [place] yesterday. She will be having home health services from [place]. I will be calling her weekly for transition of care calls.  She says she is doing well. She resides alone. No local family to depend on. Her neighbors and she all look after one another. She also does have an emergency alert.  I have given her my number and the 24 hour nurse line number for problems or questions.  I will call her again next week.  Deloria Lair [facility] [place] [phone]

## 2016-07-28 ENCOUNTER — Ambulatory Visit: Payer: Medicare Other | Admitting: Internal Medicine

## 2016-07-28 DIAGNOSIS — D649 Anemia, unspecified: Secondary | ICD-10-CM | POA: Diagnosis not present

## 2016-07-28 DIAGNOSIS — Z8701 Personal history of pneumonia (recurrent): Secondary | ICD-10-CM | POA: Diagnosis not present

## 2016-07-28 DIAGNOSIS — F419 Anxiety disorder, unspecified: Secondary | ICD-10-CM | POA: Diagnosis not present

## 2016-07-28 DIAGNOSIS — I482 Chronic atrial fibrillation: Secondary | ICD-10-CM | POA: Diagnosis not present

## 2016-07-28 DIAGNOSIS — Z87891 Personal history of nicotine dependence: Secondary | ICD-10-CM | POA: Diagnosis not present

## 2016-07-28 DIAGNOSIS — I5043 Acute on chronic combined systolic (congestive) and diastolic (congestive) heart failure: Secondary | ICD-10-CM | POA: Diagnosis not present

## 2016-07-28 DIAGNOSIS — I429 Cardiomyopathy, unspecified: Secondary | ICD-10-CM | POA: Diagnosis not present

## 2016-07-28 DIAGNOSIS — J449 Chronic obstructive pulmonary disease, unspecified: Secondary | ICD-10-CM | POA: Diagnosis not present

## 2016-07-28 DIAGNOSIS — M150 Primary generalized (osteo)arthritis: Secondary | ICD-10-CM | POA: Diagnosis not present

## 2016-07-28 DIAGNOSIS — E440 Moderate protein-calorie malnutrition: Secondary | ICD-10-CM | POA: Diagnosis not present

## 2016-07-28 DIAGNOSIS — Z9981 Dependence on supplemental oxygen: Secondary | ICD-10-CM | POA: Diagnosis not present

## 2016-07-28 DIAGNOSIS — I872 Venous insufficiency (chronic) (peripheral): Secondary | ICD-10-CM | POA: Diagnosis not present

## 2016-07-28 DIAGNOSIS — Z85828 Personal history of other malignant neoplasm of skin: Secondary | ICD-10-CM | POA: Diagnosis not present

## 2016-07-28 DIAGNOSIS — M810 Age-related osteoporosis without current pathological fracture: Secondary | ICD-10-CM | POA: Diagnosis not present

## 2016-07-28 DIAGNOSIS — N183 Chronic kidney disease, stage 3 (moderate): Secondary | ICD-10-CM | POA: Diagnosis not present

## 2016-07-28 DIAGNOSIS — K5730 Diverticulosis of large intestine without perforation or abscess without bleeding: Secondary | ICD-10-CM | POA: Diagnosis not present

## 2016-07-28 DIAGNOSIS — I130 Hypertensive heart and chronic kidney disease with heart failure and stage 1 through stage 4 chronic kidney disease, or unspecified chronic kidney disease: Secondary | ICD-10-CM | POA: Diagnosis not present

## 2016-07-28 DIAGNOSIS — Z853 Personal history of malignant neoplasm of breast: Secondary | ICD-10-CM | POA: Diagnosis not present

## 2016-07-30 DIAGNOSIS — I482 Chronic atrial fibrillation: Secondary | ICD-10-CM | POA: Diagnosis not present

## 2016-07-30 DIAGNOSIS — I5043 Acute on chronic combined systolic (congestive) and diastolic (congestive) heart failure: Secondary | ICD-10-CM | POA: Diagnosis not present

## 2016-07-30 DIAGNOSIS — I130 Hypertensive heart and chronic kidney disease with heart failure and stage 1 through stage 4 chronic kidney disease, or unspecified chronic kidney disease: Secondary | ICD-10-CM | POA: Diagnosis not present

## 2016-07-30 DIAGNOSIS — J449 Chronic obstructive pulmonary disease, unspecified: Secondary | ICD-10-CM | POA: Diagnosis not present

## 2016-07-30 DIAGNOSIS — I429 Cardiomyopathy, unspecified: Secondary | ICD-10-CM | POA: Diagnosis not present

## 2016-07-30 DIAGNOSIS — N183 Chronic kidney disease, stage 3 (moderate): Secondary | ICD-10-CM | POA: Diagnosis not present

## 2016-07-31 ENCOUNTER — Other Ambulatory Visit: Payer: Self-pay | Admitting: *Deleted

## 2016-07-31 ENCOUNTER — Encounter: Payer: Self-pay | Admitting: *Deleted

## 2016-07-31 NOTE — Patient Outreach (Signed)
Transtition of care call. Pt is doing very well. We discussed her advanced directives today. She has a living will and HCPOA but no MOST form posted in her home. Since she lives alone and her family is not in town, I have suggested this would be a good step to take to ensure that her wishes are followed in the case of an emergency. I will send her the form and suggest she put it in a plan envelop labeled MOST FORM and put it on her refrigerator.  I have reinforced her daily activities for HF and when to call for assistance.  I will call her again next week.  Fall Risk  07/31/2016 02/10/2016 12/30/2015 12/24/2015 11/11/2014  Falls in the past year? No Yes Yes Yes Yes  Number falls in past yr: - 2 or more 2 or more 2 or more 1  Injury with Fall? - Yes Yes Yes Yes  Risk Factor Category  - High Fall Risk - - -  Risk for fall due to : Impaired balance/gait History of fall(s);Impaired balance/gait;Impaired mobility History of fall(s) - Other (Comment)  Follow up - Education provided;Falls prevention discussed Falls prevention discussed;Education provided - -   Depression screen [facility] 2/9 07/31/2016 02/10/2016 12/30/2015 12/24/2015 11/11/2014  Decreased Interest 0 0 0 0 0  Down, Depressed, Hopeless 0 1 0 0 0  PHQ - 2 Score 0 1 0 0 0  Some recent data might be hidden   [facility] CM Care Plan Problem One   Flowsheet Row Most Recent Value  Care Plan Problem One  Pt is at risk for readmission.  Role Documenting the Problem One  Care Management [place] for Problem One  Active  THN Long Term Goal (31-90 days)  Pt will not readmit in the next 30 days.  THN Long Term Goal Start Date  07/25/16  Interventions for Problem One Long Term Goal  Reviewed HF Action plan daily monitoring. When to call MD or NP for assistance.  [facility]N CM Short Term Goal #1 (0-30 days)  Pt will weigh daily and report wt gain of 3# in 24 hours or 5# in several days to NP or MD.  [facility] CM Short Term Goal #1 Start Date  07/31/16    [facility] CM  Care Plan Problem Two   Flowsheet Row Most Recent Value  Care Plan Problem Two  Pt will weigh daily and call if wt goes up 3 pounds in a day or 5 pounds over several days.  Role Documenting the Problem Two  Care Management Coordinator    [facility] CM Care Plan Problem Three   Flowsheet Row Most Recent Value  Care Plan Problem Three  Pt is elderly, lives alone and does not have a MOST form in her home to advise of desired emergency actions.  Role Documenting the Problem Three  Care Management Coordinator  Care Plan for Problem Three  Active  THN CM Short Term Goal #1 (0-30 days)  Pt will recieve and consider filling out a MOST form for posting in her home.  THN CM Short Term Goal #1 Start Date  07/31/16  Interventions for Short Term Goal #1  I will send her the form and suggest a way for her to have it available for emergencies.      Deloria Lair [facility] [place] [phone]

## 2016-08-01 DIAGNOSIS — I429 Cardiomyopathy, unspecified: Secondary | ICD-10-CM | POA: Diagnosis not present

## 2016-08-01 DIAGNOSIS — I5043 Acute on chronic combined systolic (congestive) and diastolic (congestive) heart failure: Secondary | ICD-10-CM | POA: Diagnosis not present

## 2016-08-01 DIAGNOSIS — J449 Chronic obstructive pulmonary disease, unspecified: Secondary | ICD-10-CM | POA: Diagnosis not present

## 2016-08-01 DIAGNOSIS — N183 Chronic kidney disease, stage 3 (moderate): Secondary | ICD-10-CM | POA: Diagnosis not present

## 2016-08-01 DIAGNOSIS — I130 Hypertensive heart and chronic kidney disease with heart failure and stage 1 through stage 4 chronic kidney disease, or unspecified chronic kidney disease: Secondary | ICD-10-CM | POA: Diagnosis not present

## 2016-08-01 DIAGNOSIS — I482 Chronic atrial fibrillation: Secondary | ICD-10-CM | POA: Diagnosis not present

## 2016-08-03 ENCOUNTER — Encounter: Payer: Self-pay | Admitting: Internal Medicine

## 2016-08-03 ENCOUNTER — Ambulatory Visit (INDEPENDENT_AMBULATORY_CARE_PROVIDER_SITE_OTHER): Payer: Medicare Other | Admitting: Internal Medicine

## 2016-08-03 ENCOUNTER — Other Ambulatory Visit (INDEPENDENT_AMBULATORY_CARE_PROVIDER_SITE_OTHER): Payer: Medicare Other

## 2016-08-03 VITALS — BP 124/52 | HR 62 | Wt 113.0 lb

## 2016-08-03 DIAGNOSIS — I5043 Acute on chronic combined systolic (congestive) and diastolic (congestive) heart failure: Secondary | ICD-10-CM | POA: Diagnosis not present

## 2016-08-03 DIAGNOSIS — I10 Essential (primary) hypertension: Secondary | ICD-10-CM | POA: Diagnosis not present

## 2016-08-03 DIAGNOSIS — I5022 Chronic systolic (congestive) heart failure: Secondary | ICD-10-CM | POA: Diagnosis not present

## 2016-08-03 DIAGNOSIS — I130 Hypertensive heart and chronic kidney disease with heart failure and stage 1 through stage 4 chronic kidney disease, or unspecified chronic kidney disease: Secondary | ICD-10-CM | POA: Diagnosis not present

## 2016-08-03 DIAGNOSIS — F411 Generalized anxiety disorder: Secondary | ICD-10-CM

## 2016-08-03 DIAGNOSIS — H6123 Impacted cerumen, bilateral: Secondary | ICD-10-CM | POA: Diagnosis not present

## 2016-08-03 DIAGNOSIS — I482 Chronic atrial fibrillation, unspecified: Secondary | ICD-10-CM

## 2016-08-03 DIAGNOSIS — J449 Chronic obstructive pulmonary disease, unspecified: Secondary | ICD-10-CM | POA: Diagnosis not present

## 2016-08-03 DIAGNOSIS — I429 Cardiomyopathy, unspecified: Secondary | ICD-10-CM | POA: Diagnosis not present

## 2016-08-03 DIAGNOSIS — N184 Chronic kidney disease, stage 4 (severe): Secondary | ICD-10-CM | POA: Diagnosis not present

## 2016-08-03 DIAGNOSIS — N183 Chronic kidney disease, stage 3 (moderate): Secondary | ICD-10-CM | POA: Diagnosis not present

## 2016-08-03 LAB — BASIC METABOLIC PANEL
BUN: 24 mg/dL — AB (ref 6–23)
CALCIUM: 9.6 mg/dL (ref 8.4–10.5)
CHLORIDE: 99 meq/L (ref 96–112)
CO2: 36 meq/L — AB (ref 19–32)
CREATININE: 1.46 mg/dL — AB (ref 0.40–1.20)
GFR: 36.18 mL/min — ABNORMAL LOW (ref >60.00)
GLUCOSE: 97 mg/dL (ref 70–99)
Potassium: 3.4 mEq/L — ABNORMAL LOW (ref 3.5–5.1)
Sodium: 143 mEq/L (ref 135–145)

## 2016-08-03 LAB — TSH: TSH: 0.03 u[IU]/mL — ABNORMAL LOW (ref 0.35–4.50)

## 2016-08-03 LAB — HEPATIC FUNCTION PANEL
ALK PHOS: 121 U/L — AB (ref 39–117)
ALT: 34 U/L (ref 0–35)
AST: 36 U/L (ref 0–37)
Albumin: 4.1 g/dL (ref 3.5–5.2)
BILIRUBIN DIRECT: 0.1 mg/dL (ref 0.0–0.3)
BILIRUBIN TOTAL: 0.7 mg/dL (ref 0.2–1.2)
Total Protein: 6.9 g/dL (ref 6.0–8.3)

## 2016-08-03 NOTE — Progress Notes (Signed)
Pre visit review using our clinic review tool, if applicable. No additional management support is needed unless otherwise documented below in the visit note. 

## 2016-08-03 NOTE — Progress Notes (Signed)
Subjective:  Patient ID: Mia Contreras, female    DOB: 1928-12-20  Age: 82 y.o. MRN: [id number]  CC: No chief complaint on file.   HPI Mia Contreras presents for CHF, edema, anxiety f/u. She was at [facility] after CHF and high K admission in October. C/o wax  Outpatient Medications Prior to Visit  Medication Sig Dispense Refill  . ALPRAZolam (XANAX) 0.25 MG tablet Take 1 tablet (0.25 mg total) by mouth at bedtime. 15 tablet 0  . ALPRAZolam (XANAX) 0.5 MG tablet Take 1 tablet (0.5 mg total) by mouth 2 (two) times daily as needed for anxiety. 20 tablet 0  . apixaban (ELIQUIS) 2.5 MG TABS tablet Take 2.5 mg by mouth 2 (two) times daily.    . Calcium Carbonate-Vitamin D (CALCIUM 600+D) 600-400 MG-UNIT per tablet Take 1 tablet by mouth daily.    . Fluticasone-Salmeterol (ADVAIR DISKUS) 250-50 MCG/DOSE AEPB Inhale 1 puff into the lungs 2 (two) times daily. 60 each 3  . Multiple Vitamins-Minerals (MULTIVITAMIN PO) Take 1 tablet by mouth daily.     Marland Kitchen torsemide (DEMADEX) 20 MG tablet Take 2 tablets (40 mg total) by mouth 2 (two) times daily. 120 tablet 6  . vitamin C (ASCORBIC ACID) 500 MG tablet Take 1 tablet (500 mg total) by mouth daily. 100 tablet 3   No facility-administered medications prior to visit.     ROS Review of Systems  Constitutional: Positive for unexpected weight change. Negative for activity change, appetite change, chills and fatigue.  HENT: Negative for congestion, mouth sores and sinus pressure.   Eyes: Negative for visual disturbance.  Respiratory: Negative for cough and chest tightness.   Gastrointestinal: Negative for abdominal pain and nausea.  Genitourinary: Negative for difficulty urinating, frequency and vaginal pain.  Musculoskeletal: Negative for back pain and gait problem.  Skin: Negative for pallor and rash.  Neurological: Negative for dizziness, tremors, weakness, numbness and headaches.  Psychiatric/Behavioral: Negative for confusion, sleep  disturbance and suicidal ideas. The patient is nervous/anxious.     Objective:  BP (!) 124/52   Pulse 62   Wt 113 lb (51.3 kg)   SpO2 93%   BMI 22.07 kg/m   BP Readings from Last 3 Encounters:  08/03/16 (!) 124/52  07/19/16 132/60  06/15/16 (!) 104/56    Wt Readings from Last 3 Encounters:  08/03/16 113 lb (51.3 kg)  07/19/16 115 lb 6.4 oz (52.3 kg)  06/15/16 121 lb 8 oz (55.1 kg)    Physical Exam  Constitutional: She appears well-developed. No distress.  HENT:  Head: Normocephalic.  Right Ear: External ear normal.  Left Ear: External ear normal.  Nose: Nose normal.  Mouth/Throat: Oropharynx is clear and moist.  Eyes: Conjunctivae are normal. Pupils are equal, round, and reactive to light. Right eye exhibits no discharge. Left eye exhibits no discharge.  Neck: Normal range of motion. Neck supple. No JVD present. No tracheal deviation present. No thyromegaly present.  Cardiovascular: Normal rate, regular rhythm and normal heart sounds.   Pulmonary/Chest: No stridor. No respiratory distress. She has no wheezes.  Abdominal: Soft. Bowel sounds are normal. She exhibits no distension and no mass. There is no tenderness. There is no rebound and no guarding.  Musculoskeletal: She exhibits no edema or tenderness.  Lymphadenopathy:    She has no cervical adenopathy.  Neurological: She displays normal reflexes. No cranial nerve deficit. She exhibits normal muscle tone. Coordination normal.  Skin: No rash noted. No erythema.  Psychiatric: She has a normal mood  and affect. Her behavior is normal. Judgment and thought content normal.  Thin No edema Ear wax B   Procedure Note :     Procedure :  Ear irrigation   Indication:  Cerumen impaction B   Risks, including pain, dizziness, eardrum perforation, bleeding, infection and others as well as benefits were explained to the patient in detail. Verbal consent was obtained and the patient agreed to proceed.    We used "The Elephant  Ear Irrigation Device" filled with lukewarm water for irrigation. A large amount wax was recovered. Procedure has also required manual wax removal with an ear loop.   Tolerated well. Complications: None.   Postprocedure instructions :  Call if problems.    Lab Results  Component Value Date   WBC 8.6 06/06/2016   HGB 8.9 (L) 06/06/2016   HCT 29.7 (L) 06/06/2016   PLT 275 06/06/2016   GLUCOSE 106 (H) 06/11/2016   CHOL 130 11/04/2015   TRIG 77.0 11/04/2015   HDL 54.50 11/04/2015   LDLCALC 60 11/04/2015   ALT 75 (H) 06/06/2016   AST 76 (H) 06/06/2016   NA 139 06/11/2016   K 4.4 06/11/2016   CL 104 06/11/2016   CREATININE 2.24 (H) 06/11/2016   BUN 35 (H) 06/11/2016   CO2 29 06/11/2016   TSH 0.592 01/26/2016   INR 0.95 10/25/2011   HGBA1C 5.8 02/06/2014   MICROALBUR 0.6 03/23/2011    No results found.  Assessment & Plan:   There are no diagnoses linked to this encounter. I am having Mia Contreras maintain her Calcium Carbonate-Vitamin D, Multiple Vitamins-Minerals (MULTIVITAMIN PO), Fluticasone-Salmeterol, torsemide, vitamin C, ALPRAZolam, ALPRAZolam, and apixaban.  No orders of the defined types were placed in this encounter.    Follow-up: No Follow-up on file.  Walker Kehr, MD

## 2016-08-03 NOTE — Assessment & Plan Note (Signed)
On Eliquis O2 [place]

## 2016-08-03 NOTE — Assessment & Plan Note (Signed)
On Eliquis,Torsemide On O2 [place]

## 2016-08-03 NOTE — Assessment & Plan Note (Signed)
Will irrigate 

## 2016-08-03 NOTE — Assessment & Plan Note (Signed)
On Demadex 

## 2016-08-03 NOTE — Patient Instructions (Signed)
Low-Sodium Eating Plan Sodium raises blood pressure and causes water to be held in the body. Getting less sodium from food will help lower your blood pressure, reduce any swelling, and protect your heart, liver, and kidneys. We get sodium by adding salt (sodium chloride) to food. Most of our sodium comes from canned, boxed, and frozen foods. Restaurant foods, fast foods, and pizza are also very high in sodium. Even if you take medicine to lower your blood pressure or to reduce fluid in your body, getting less sodium from your food is important. What is my plan? Most people should limit their sodium intake to 2,300 mg a day. Your health care provider recommends that you limit your sodium intake to __________ a day. What do I need to know about this eating plan? For the low-sodium eating plan, you will follow these general guidelines:  Choose foods with a % Daily Value for sodium of less than 5% (as listed on the food label).  Use salt-free seasonings or herbs instead of table salt or sea salt.  Check with your health care provider or pharmacist before using salt substitutes.  Eat fresh foods.  Eat more vegetables and fruits.  Limit canned vegetables. If you do use them, rinse them well to decrease the sodium.  Limit cheese to 1 oz (28 g) per day.  Eat lower-sodium products, often labeled as "lower sodium" or "no salt added."  Avoid foods that contain monosodium glutamate (MSG). MSG is sometimes added to [place] food and some canned foods.  Check food labels (Nutrition Facts labels) on foods to learn how much sodium is in one serving.  Eat more home-cooked food and less restaurant, buffet, and fast food.  When eating at a restaurant, ask that your food be prepared with less salt, or no salt if possible. How do I read food labels for sodium information? The Nutrition Facts label lists the amount of sodium in one serving of the food. If you eat more than one serving, you must multiply the  listed amount of sodium by the number of servings. Food labels may also identify foods as:  Sodium free-Less than 5 mg in a serving.  Very low sodium-35 mg or less in a serving.  Low sodium-140 mg or less in a serving.  Light in sodium-50% less sodium in a serving. For example, if a food that usually has 300 mg of sodium is changed to become light in sodium, it will have 150 mg of sodium.  Reduced sodium-25% less sodium in a serving. For example, if a food that usually has 400 mg of sodium is changed to reduced sodium, it will have 300 mg of sodium. What foods can I eat? Grains  Low-sodium cereals, including oats, puffed wheat and rice, and shredded wheat cereals. Low-sodium crackers. Unsalted rice and pasta. Lower-sodium bread. Vegetables  Frozen or fresh vegetables. Low-sodium or reduced-sodium canned vegetables. Low-sodium or reduced-sodium tomato sauce and paste. Low-sodium or reduced-sodium tomato and vegetable juices. Fruits  Fresh, frozen, and canned fruit. Fruit juice. Meat and Other Protein Products  Low-sodium canned tuna and salmon. Fresh or frozen meat, poultry, seafood, and fish. Lamb. Unsalted nuts. Dried beans, peas, and lentils without added salt. Unsalted canned beans. Homemade soups without salt. Eggs. Dairy  Milk. Soy milk. Ricotta cheese. Low-sodium or reduced-sodium cheeses. Yogurt. Condiments  Fresh and dried herbs and spices. Salt-free seasonings. Onion and garlic powders. Low-sodium varieties of mustard and ketchup. Fresh or refrigerated horseradish. Lemon juice. Fats and Oils  Reduced-sodium  salad dressings. Unsalted butter. Other  Unsalted popcorn and pretzels. The items listed above may not be a complete list of recommended foods or beverages. Contact your dietitian for more options.  What foods are not recommended? Grains  Instant hot cereals. Bread stuffing, pancake, and biscuit mixes. Croutons. Seasoned rice or pasta mixes. Noodle soup cups. Boxed or  frozen macaroni and cheese. Self-rising flour. Regular salted crackers. Vegetables  Regular canned vegetables. Regular canned tomato sauce and paste. Regular tomato and vegetable juices. Frozen vegetables in sauces. Salted [place] fries. Olives. Angie Fava. Relishes. Sauerkraut. Salsa. Meat and Other Protein Products  Salted, canned, smoked, spiced, or pickled meats, seafood, or fish. Bacon, ham, sausage, hot dogs, corned beef, chipped beef, and packaged luncheon meats. Salt pork. Jerky. Pickled herring. Anchovies, regular canned tuna, and sardines. Salted nuts. Dairy  Processed cheese and cheese spreads. Cheese curds. Blue cheese and cottage cheese. Buttermilk. Condiments  Onion and garlic salt, seasoned salt, table salt, and sea salt. Canned and packaged gravies. Worcestershire sauce. Tartar sauce. Barbecue sauce. Teriyaki sauce. Soy sauce, including reduced sodium. Steak sauce. Fish sauce. Oyster sauce. Cocktail sauce. Horseradish that you find on the shelf. Regular ketchup and mustard. Meat flavorings and tenderizers. Bouillon cubes. Hot sauce. Tabasco sauce. Marinades. Taco seasonings. Relishes. Fats and Oils  Regular salad dressings. Salted butter. Margarine. Ghee. Bacon fat. Other  Potato and tortilla chips. Corn chips and puffs. Salted popcorn and pretzels. Canned or dried soups. Pizza. Frozen entrees and pot pies. The items listed above may not be a complete list of foods and beverages to avoid. Contact your dietitian for more information.  This information is not intended to replace advice given to you by your health care provider. Make sure you discuss any questions you have with your health care provider. Document Released: 01/27/2002 Document Revised: 01/13/2016 Document Reviewed: 06/11/2013 Elsevier Interactive Patient Education  2017 [employer].

## 2016-08-03 NOTE — Assessment & Plan Note (Signed)
F/u w/Dr Junita Push

## 2016-08-03 NOTE — Assessment & Plan Note (Signed)
Xanax prn 

## 2016-08-04 DIAGNOSIS — N183 Chronic kidney disease, stage 3 (moderate): Secondary | ICD-10-CM | POA: Diagnosis not present

## 2016-08-04 DIAGNOSIS — I130 Hypertensive heart and chronic kidney disease with heart failure and stage 1 through stage 4 chronic kidney disease, or unspecified chronic kidney disease: Secondary | ICD-10-CM | POA: Diagnosis not present

## 2016-08-04 DIAGNOSIS — J449 Chronic obstructive pulmonary disease, unspecified: Secondary | ICD-10-CM | POA: Diagnosis not present

## 2016-08-04 DIAGNOSIS — I429 Cardiomyopathy, unspecified: Secondary | ICD-10-CM | POA: Diagnosis not present

## 2016-08-04 DIAGNOSIS — I5043 Acute on chronic combined systolic (congestive) and diastolic (congestive) heart failure: Secondary | ICD-10-CM | POA: Diagnosis not present

## 2016-08-04 DIAGNOSIS — I482 Chronic atrial fibrillation: Secondary | ICD-10-CM | POA: Diagnosis not present

## 2016-08-04 LAB — CBC WITH DIFFERENTIAL/PLATELET
BASOS PCT: 0.7 % (ref 0.0–3.0)
Basophils Absolute: 0 10*3/uL (ref 0.0–0.1)
Eosinophils Absolute: 0.2 10*3/uL (ref 0.0–0.7)
Eosinophils Relative: 2.9 % (ref 0.0–5.0)
HCT: 38.7 % (ref 36.0–46.0)
Hemoglobin: 12.8 g/dL (ref 12.0–15.0)
LYMPHS ABS: 1.9 10*3/uL (ref 0.7–4.0)
Lymphocytes Relative: 26.8 % (ref 12.0–46.0)
MCHC: 33.1 g/dL (ref 30.0–36.0)
MCV: 92.9 fl (ref 78.0–100.0)
Monocytes Absolute: 0.5 10*3/uL (ref 0.1–1.0)
Monocytes Relative: 7.8 % (ref 3.0–12.0)
NEUTROS ABS: 4.3 10*3/uL (ref 1.4–7.7)
NEUTROS PCT: 61.8 % (ref 43.0–77.0)
PLATELETS: 317 10*3/uL (ref 150.0–400.0)
RBC: 4.16 Mil/uL (ref 3.87–5.11)
RDW: 17.6 % — AB (ref 11.5–15.5)
WBC: 6.9 10*3/uL (ref 4.0–10.5)

## 2016-08-08 ENCOUNTER — Other Ambulatory Visit: Payer: Self-pay | Admitting: *Deleted

## 2016-08-08 ENCOUNTER — Encounter (HOSPITAL_COMMUNITY): Payer: Self-pay

## 2016-08-08 ENCOUNTER — Emergency Department (HOSPITAL_COMMUNITY): Payer: Medicare Other

## 2016-08-08 ENCOUNTER — Emergency Department (HOSPITAL_COMMUNITY)
Admission: EM | Admit: 2016-08-08 | Discharge: 2016-08-08 | Disposition: A | Discharge status: Home / Self Care | Payer: Medicare Other | Attending: Emergency Medicine | Admitting: Emergency Medicine

## 2016-08-08 DIAGNOSIS — J449 Chronic obstructive pulmonary disease, unspecified: Secondary | ICD-10-CM | POA: Insufficient documentation

## 2016-08-08 DIAGNOSIS — I130 Hypertensive heart and chronic kidney disease with heart failure and stage 1 through stage 4 chronic kidney disease, or unspecified chronic kidney disease: Secondary | ICD-10-CM | POA: Diagnosis not present

## 2016-08-08 DIAGNOSIS — I429 Cardiomyopathy, unspecified: Secondary | ICD-10-CM | POA: Diagnosis not present

## 2016-08-08 DIAGNOSIS — E876 Hypokalemia: Secondary | ICD-10-CM

## 2016-08-08 DIAGNOSIS — S6991XA Unspecified injury of right wrist, hand and finger(s), initial encounter: Secondary | ICD-10-CM | POA: Diagnosis present

## 2016-08-08 DIAGNOSIS — Y93E5 Activity, floor mopping and cleaning: Secondary | ICD-10-CM | POA: Insufficient documentation

## 2016-08-08 DIAGNOSIS — I5042 Chronic combined systolic (congestive) and diastolic (congestive) heart failure: Secondary | ICD-10-CM | POA: Insufficient documentation

## 2016-08-08 DIAGNOSIS — I5043 Acute on chronic combined systolic (congestive) and diastolic (congestive) heart failure: Secondary | ICD-10-CM | POA: Diagnosis not present

## 2016-08-08 DIAGNOSIS — S61411A Laceration without foreign body of right hand, initial encounter: Secondary | ICD-10-CM | POA: Diagnosis not present

## 2016-08-08 DIAGNOSIS — Z96653 Presence of artificial knee joint, bilateral: Secondary | ICD-10-CM | POA: Insufficient documentation

## 2016-08-08 DIAGNOSIS — Z85828 Personal history of other malignant neoplasm of skin: Secondary | ICD-10-CM | POA: Diagnosis not present

## 2016-08-08 DIAGNOSIS — S61212A Laceration without foreign body of right middle finger without damage to nail, initial encounter: Secondary | ICD-10-CM | POA: Diagnosis not present

## 2016-08-08 DIAGNOSIS — N184 Chronic kidney disease, stage 4 (severe): Secondary | ICD-10-CM | POA: Diagnosis not present

## 2016-08-08 DIAGNOSIS — R52 Pain, unspecified: Secondary | ICD-10-CM

## 2016-08-08 DIAGNOSIS — Z7901 Long term (current) use of anticoagulants: Secondary | ICD-10-CM | POA: Diagnosis not present

## 2016-08-08 DIAGNOSIS — I482 Chronic atrial fibrillation: Secondary | ICD-10-CM | POA: Diagnosis not present

## 2016-08-08 DIAGNOSIS — Y92009 Unspecified place in unspecified non-institutional (private) residence as the place of occurrence of the external cause: Secondary | ICD-10-CM | POA: Diagnosis not present

## 2016-08-08 DIAGNOSIS — Z87891 Personal history of nicotine dependence: Secondary | ICD-10-CM | POA: Diagnosis not present

## 2016-08-08 DIAGNOSIS — R7989 Other specified abnormal findings of blood chemistry: Secondary | ICD-10-CM

## 2016-08-08 DIAGNOSIS — W010XXA Fall on same level from slipping, tripping and stumbling without subsequent striking against object, initial encounter: Secondary | ICD-10-CM | POA: Diagnosis not present

## 2016-08-08 DIAGNOSIS — Y999 Unspecified external cause status: Secondary | ICD-10-CM | POA: Diagnosis not present

## 2016-08-08 DIAGNOSIS — Z853 Personal history of malignant neoplasm of breast: Secondary | ICD-10-CM | POA: Diagnosis not present

## 2016-08-08 DIAGNOSIS — N183 Chronic kidney disease, stage 3 (moderate): Secondary | ICD-10-CM | POA: Diagnosis not present

## 2016-08-08 MED ORDER — LIDOCAINE HCL 1 % IJ SOLN
INTRAMUSCULAR | Status: AC
  Administered 2016-08-08: 20 mL
  Filled 2016-08-08: qty 20

## 2016-08-08 MED ORDER — LIDOCAINE HCL (PF) 1 % IJ SOLN: 20.0000 mL | Freq: Once | INTRAMUSCULAR | Status: DC

## 2016-08-08 MED ORDER — LIDOCAINE HCL (PF) 1 % IJ SOLN
5.0000 mL | Freq: Once | INTRAMUSCULAR | Status: DC
  Filled 2016-08-08: qty 30

## 2016-08-08 MED ORDER — BACITRACIN ZINC 500 UNIT/GM EX OINT
TOPICAL_OINTMENT | Freq: Once | CUTANEOUS | Status: AC
  Administered 2016-08-08: 16:00:00 via TOPICAL
  Filled 2016-08-08: qty 0.9

## 2016-08-08 NOTE — Discharge Instructions (Signed)
Keep wound clean using Dial antibacterial soap and water, pat dry. You may apply a small amount of antibiotic ointment to wound daily.  Return to the emergency department or be seen by her primary care provider in 7 days for suture removal. Please return to the Emergency Department if symptoms worsen or new onset of fever, redness, swelling, warmth, drainage, numbness, tingling, weakness.

## 2016-08-08 NOTE — ED Provider Notes (Signed)
[facility] DEPT Provider Note   CSN: [id number] Arrival date & time: 08/08/16  1232     History   Chief Complaint Chief Complaint  Patient presents with  . Laceration    HPI Mia Contreras is a 82 y.o. female.  HPI   Patient is a 82 year old female with history of HTN, OA, scoliosis, COPD, CHF who presents to the ED status post fall that occurred prior to arrival. Patient reports she was cleaning her house and reports spraining Lysol all over the bathroom. Patient reports she thinks she sprayed too much on the ground resulting in her slipping on the linoleum floor and landing on her right side with a Lysol can in her right hand. Patient reports laceration to her right middle finger. Bleeding controlled prior to arrival. Patient denies any other pain or complaints at this time. Denies head injury or LOC. Denies neck pain, back pain, hip pain or pain to her arms/legs. Patient reports initially being seen at an urgent care however she was advised to come to the ED for further management of her laceration. Patient reports currently being on Eliquis. Tetanus UTD.   Past Medical History:  Diagnosis Date  . AKI (acute kidney injury) ([place]) 01/2016  . ANEMIA-NOS 08/20/2007  . ANXIETY 03/16/2007  . BREAST CANCER, HX OF 03/16/2007  . CARPAL TUNNEL SYNDROME, RIGHT 01/11/2008  . Cataract    floaters  . CELLULITIS, LEG, RIGHT 08/20/2007  . Collagenous colitis   . COPD (chronic obstructive pulmonary disease) ([place])   . DIVERTICULOSIS, COLON 08/20/2007  . GOITER, MULTINODULAR 05/06/2010   Dr [name]  . HYPERLIPIDEMIA 08/20/2007  . HYPERTENSION 03/16/2007  . HYPERTHYROIDISM 05/23/2010  . IBS (irritable bowel syndrome)   . OSTEOARTHRITIS 03/16/2007  . OSTEOPOROSIS 08/20/2007  . Pneumonia 07/2011   took abx for several weeks  . PREMATURE ATRIAL CONTRACTIONS 09/21/2010  . SCOLIOSIS 01/11/2008  . SKIN CANCER, HX OF 11/12/2008    R cheek 2010, L Cheek 2011 Dr. Tonia Brooms  . Thyroid nodule   .  VENOUS INSUFFICIENCY 09/05/2007    Patient Active Problem List   Diagnosis Date Noted  . Symptomatic bradycardia   . Acute systolic CHF (congestive heart failure) ([place])   . Hyperkalemia 06/06/2016  . CKD (chronic kidney disease) stage 4, GFR 15-29 ml/min (H[facility]) 06/06/2016  . Skin tear of left forearm without complication 98/33/8250  . HCAP (healthcare-associated pneumonia) 01/30/2016  . Chronic anticoagulation-Eliquis 01/30/2016  . Hypotension-currently asymptomatic 01/30/2016  . Malnutrition of moderate degree 01/27/2016  . Falls 01/26/2016  . Hypoxia 01/26/2016  . Acute on chronic diastolic congestive heart failure, NYHA class 4 ([place]) 01/26/2016  . Atrial fibrillation ([place]) 01/11/2016  . PAF- currently in AF on Amiodarone 200 mg BID   . AKI (acute kidney injury) ([place]) 12/14/2015  . Acute on chronic combined systolic and diastolic heart failure ([place]) 12/14/2015  . Acute kidney injury superimposed on chronic kidney disease ([place]) 12/14/2015  . CRF (chronic renal failure) 12/14/2015  . IBS (irritable bowel syndrome) 12/14/2015  . Open leg wound 11/08/2015  . Hiatal hernia 05/12/2015  . Subconjunctival hemorrhage 08/07/2014  . Chronic renal failure 08/07/2014  . Nausea without vomiting 07/15/2014  . Noninfected skin tear of right leg 07/03/2014  . Hypoglycemia 12/12/2013  . Chronic systolic heart failure ([place]) 08/22/2013  . NICM (nonischemic cardiomyopathy) ([place]) 08/22/2013  . Diarrhea 10/01/2012  . Dyspnea on exertion 06/27/2012  . Asthmatic bronchitis 06/27/2012  . UTI (urinary tract infection) 11/02/2011  . Postoperative anemia due to acute  blood loss 11/01/2011  . COPD (chronic obstructive pulmonary disease) ([place]) 08/16/2011  . Cough 08/08/2011  . Preop exam for internal medicine 08/02/2011  . Cerumen impaction 08/02/2011  . Bruit 04/19/2011  . Arrhythmia 03/29/2011  . Neoplasm of uncertain behavior of skin 09/21/2010  . PREMATURE ATRIAL CONTRACTIONS 09/21/2010  .  CYSTITIS 09/21/2010  . Thyrotoxicosis 05/23/2010  . GOITER, MULTINODULAR 05/06/2010  . TOBACCO USE, QUIT 06/14/2009  . ELECTROCARDIOGRAM, ABNORMAL 04/02/2009  . SKIN CANCER, HX OF 11/12/2008  . CARPAL TUNNEL SYNDROME, RIGHT 01/11/2008  . SCOLIOSIS 01/11/2008  . VENOUS INSUFFICIENCY 09/05/2007  . Edema 09/05/2007  . Anemia in chronic kidney disease 08/20/2007  . DIVERTICULOSIS, COLON 08/20/2007  . CELLULITIS, LEG, RIGHT 08/20/2007  . OSTEOPOROSIS 08/20/2007  . Open wound of knee, leg, and ankle 07/03/2007  . Anxiety state 03/16/2007  . Essential hypertension 03/16/2007  . Osteoarthritis 03/16/2007  . hx: breast cancer, left, infiltrating ductal 03/16/2007    Past Surgical History:  Procedure Laterality Date  . ABDOMINAL HYSTERECTOMY    . APPENDECTOMY  1972  . BREAST LUMPECTOMY Left 2001  . CARPAL TUNNEL RELEASE    . CATARACT EXTRACTION    . EYE SURGERY     cataract ext  . HERNIA REPAIR    . MASTECTOMY, PARTIAL Left   . SKIN CANCER EXCISION     face-Dr. Bing Plume  . TOTAL KNEE ARTHROPLASTY    . TOTAL KNEE ARTHROPLASTY Right 2010   Dr Para March  . TOTAL KNEE ARTHROPLASTY  10/30/2011   Procedure: TOTAL KNEE ARTHROPLASTY;  Surgeon: Lorn Junes, MD;  Location: [facility];  Service: Orthopedics;  Laterality: Left;     . XRT     chemo, surgery    OB History    No data available       Home Medications    Prior to Admission medications   Medication Sig Start Date End Date Taking? Authorizing Provider  ALPRAZolam Duanne Moron) 0.5 MG tablet Take 1 tablet (0.5 mg total) by mouth 2 (two) times daily as needed for anxiety. 06/12/16  Yes Shanker Kristeen Mans, MD  apixaban (ELIQUIS) 2.5 MG TABS tablet Take 2.5 mg by mouth 2 (two) times daily.   Yes Historical Provider, MD  Calcium Carbonate-Vitamin D (CALCIUM 600+D) 600-400 MG-UNIT per tablet Take 1 tablet by mouth daily.   Yes Historical Provider, MD  Fluticasone-Salmeterol (ADVAIR DISKUS) 250-50 MCG/DOSE AEPB Inhale 1 puff into the lungs 2  (two) times daily. 05/12/15  Yes Cassandria Anger, MD  Multiple Vitamins-Minerals (MULTIVITAMIN PO) Take 1 tablet by mouth daily.    Yes Historical Provider, MD  torsemide (DEMADEX) 20 MG tablet Take 2 tablets (40 mg total) by mouth 2 (two) times daily. 02/08/16  Yes Amy D Clegg, NP  vitamin C (ASCORBIC ACID) 500 MG tablet Take 1 tablet (500 mg total) by mouth daily. 03/14/16  Yes Cassandria Anger, MD    Family History Family History  Problem Relation Age of Onset  . Dementia Mother   . Heart disease Mother   . Mental retardation Mother   . Hypertension Mother   . Alzheimer's disease Mother   . Heart attack Father   . Alcohol abuse Father   . Prostate cancer Brother   . Diabetes Other   . Colon cancer Neg Hx   . Anesthesia problems Neg Hx     Social History Social History  Substance Use Topics  . Smoking status: Former Smoker    Quit date: 12/20/1980  . Smokeless tobacco: Never  Used  . Alcohol use Yes     Comment: socially     Allergies   Cefuroxime; Celecoxib; Deltasone [prednisone]; Ranitidine; Spironolactone; and Tramadol hcl   Review of Systems Review of Systems  Skin: Positive for wound (laceration).  All other systems reviewed and are negative.    Physical Exam Updated Vital Signs BP 151/61 (BP Location: Right Arm)   Pulse 70   Temp 97.8 F (36.6 C) (Oral)   Resp 16   Ht 5\' 3"  (1.6 m)   Wt 49.9 kg   SpO2 93%   BMI 19.49 kg/m   Physical Exam  Constitutional: She is oriented to person, place, and time. She appears well-developed and well-nourished. No distress.  HENT:  Head: Normocephalic and atraumatic. Head is without raccoon's eyes, without Battle's sign, without abrasion and without laceration.  Right Ear: Tympanic membrane normal. No hemotympanum.  Left Ear: Tympanic membrane normal. No hemotympanum.  Nose: Nose normal. No sinus tenderness, nasal deformity, septal deviation or nasal septal hematoma. No epistaxis.  Mouth/Throat: Uvula is  midline, oropharynx is clear and moist and mucous membranes are normal. No oropharyngeal exudate, posterior oropharyngeal edema, posterior oropharyngeal erythema or tonsillar abscesses.  Eyes: Conjunctivae and EOM are normal. Pupils are equal, round, and reactive to light. Right eye exhibits no discharge. Left eye exhibits no discharge. No scleral icterus.  Neck: Normal range of motion. Neck supple.  Cardiovascular: Normal rate, regular rhythm, normal heart sounds and intact distal pulses.   Pulmonary/Chest: Effort normal and breath sounds normal. No respiratory distress. She has no wheezes. She has no rales. She exhibits no tenderness.  Abdominal: Soft. Bowel sounds are normal. She exhibits no distension and no mass. There is no tenderness. There is no rebound and no guarding. No hernia.  Musculoskeletal: Normal range of motion. She exhibits no edema, tenderness or deformity.  No cervical, thoracic, or lumbar spine midline TTP.  Full ROM of bilateral upper and lower extremities with 5/5 strength.  FROM of bilateral hips, no pelvic instability noted. Pt able to stand and ambulate. 2+ radial and PT pulses. Sensation grossly intact.   FROM of right hand, wrist and digits 1-5 with 5/5 strength. 5/5 strength noted to right 3rd MCP, PCP and DIP joint with flexion and extension.   Neurological: She is alert and oriented to person, place, and time. She has normal strength. No cranial nerve deficit or sensory deficit. Coordination and gait normal.  Skin: Skin is warm and dry. She is not diaphoretic.  2cm laceration noted to lateral aspect of right 3rd proximal phalanx, no active bleeding. Visible subcutaneous fat.   Two small superficial skin abrasions noted to posterior aspect of right elbow and forearm, no active bleeding.   Nursing note and vitals reviewed.    ED Treatments / Results  Labs (all labs ordered are listed, but only abnormal results are displayed) Labs Reviewed - No data to  display  EKG  EKG Interpretation None       Radiology Dg Hand Complete Right  Result Date: 08/08/2016 CLINICAL DATA:  Right hand pain and laceration after fall on floor. EXAM: RIGHT HAND - COMPLETE 3+ VIEW COMPARISON:  None. FINDINGS: There is no evidence of fracture or dislocation. Narrowing and osteophyte formation is seen involving the proximal and distal interphalangeal joints, as well as the first carpometacarpal joint. Narrowing of radiocarpal joint is noted. Soft tissues are unremarkable. IMPRESSION: Findings consistent with osteoarthritis involving multiple joints. No acute abnormality seen in the right hand. Electronically Signed  By: Marijo Conception, M.D.   On: 08/08/2016 14:03    Procedures .Marland KitchenLaceration Repair Date/Time: 08/08/2016 3:22 PM Performed by: Nona Dell Authorized by: Nona Dell   Consent:    Consent obtained:  Verbal   Consent given by:  Patient Anesthesia (see MAR for exact dosages):    Anesthesia method:  Local infiltration   Local anesthetic:  Lidocaine 1% w/o epi Laceration details:    Location:  Finger   Finger location:  R long finger   Length (cm):  1.5 Repair type:    Repair type:  Simple Pre-procedure details:    Preparation:  Patient was prepped and draped in usual sterile fashion and imaging obtained to evaluate for foreign bodies Exploration:    Wound exploration: wound explored through full range of motion and entire depth of wound probed and visualized     Wound extent: no foreign bodies/material noted, no muscle damage noted, no nerve damage noted, no tendon damage noted, no underlying fracture noted and no vascular damage noted     Contaminated: no   Treatment:    Area cleansed with:  Betadine and saline   Amount of cleaning:  Standard   Irrigation solution:  Sterile saline   Irrigation method:  Syringe   Visualized foreign bodies/material removed: no   Skin repair:    Repair method:  Sutures   Suture  size:  5-0   Suture material:  Prolene   Suture technique:  Simple interrupted   Number of sutures:  4 Approximation:    Approximation:  Close   Vermilion border: well-aligned   Post-procedure details:    Dressing:  Antibiotic ointment and non-adherent dressing   Patient tolerance of procedure:  Tolerated well, no immediate complications   (including critical care time)  Medications Ordered in ED Medications  lidocaine (PF) (XYLOCAINE) 1 % injection 20 mL (not administered)  lidocaine (XYLOCAINE) 1 % (with pres) injection (not administered)     Initial Impression / Assessment and Plan / ED Course  I have reviewed the triage vital signs and the nursing notes.  Pertinent labs & imaging results that were available during my care of the patient were reviewed by me and considered in my medical decision making (see chart for details).  Clinical Course     Pressure irrigation performed. Wound explored and base of wound visualized in a bloodless field without evidence of foreign body.  Xray negative. Laceration occurred < 8 hours prior to repair which was well tolerated. Tdap UTD.  Pt has no comorbidities to effect normal wound healing. Pt discharged without antibiotics.  Discussed suture home care with patient and answered questions. Pt to follow-up for wound check and suture removal in 7 days; they are to return to the ED sooner for signs of infection. Pt is hemodynamically stable with no complaints prior to dc. Right hand neurovascularly intact.   Final Clinical Impressions(s) / ED Diagnoses   Final diagnoses:  Laceration of right middle finger without foreign body without damage to nail, initial encounter    New Prescriptions New Prescriptions   No medications on file     Nona Dell, PA-C 08/08/16 1527    Veryl Speak, MD 08/08/16 1630

## 2016-08-08 NOTE — ED Triage Notes (Signed)
Patient reports that she was spraying Lysol on a linoleum floor and slipped and cut her right middle  finger on the can of Lysol  Bleeding controlled, patient states she went to an UC and was told to come to the ED. Patient denies any hip pain from the fall and ambulates without any c/o pain.

## 2016-08-09 ENCOUNTER — Other Ambulatory Visit (INDEPENDENT_AMBULATORY_CARE_PROVIDER_SITE_OTHER): Payer: Medicare Other

## 2016-08-09 ENCOUNTER — Other Ambulatory Visit: Payer: Self-pay | Admitting: *Deleted

## 2016-08-09 ENCOUNTER — Other Ambulatory Visit: Payer: Self-pay | Admitting: Internal Medicine

## 2016-08-09 DIAGNOSIS — R946 Abnormal results of thyroid function studies: Secondary | ICD-10-CM

## 2016-08-09 DIAGNOSIS — R7989 Other specified abnormal findings of blood chemistry: Secondary | ICD-10-CM

## 2016-08-09 DIAGNOSIS — E876 Hypokalemia: Secondary | ICD-10-CM | POA: Diagnosis not present

## 2016-08-09 DIAGNOSIS — E0590 Thyrotoxicosis, unspecified without thyrotoxic crisis or storm: Secondary | ICD-10-CM

## 2016-08-09 LAB — BASIC METABOLIC PANEL
BUN: 25 mg/dL — ABNORMAL HIGH (ref 6–23)
CALCIUM: 9.5 mg/dL (ref 8.4–10.5)
CO2: 34 mEq/L — ABNORMAL HIGH (ref 19–32)
Chloride: 99 mEq/L (ref 96–112)
Creatinine, Ser: 1.66 mg/dL — ABNORMAL HIGH (ref 0.40–1.20)
GFR: 31.2 mL/min — AB (ref >60.00)
Glucose, Bld: 108 mg/dL — ABNORMAL HIGH (ref 70–99)
POTASSIUM: 3.6 meq/L (ref 3.5–5.1)
SODIUM: 142 meq/L (ref 135–145)

## 2016-08-09 LAB — TSH: TSH: 0.03 u[IU]/mL — AB (ref 0.35–4.50)

## 2016-08-09 LAB — T4, FREE: Free T4: 2.37 ng/dL — ABNORMAL HIGH (ref 0.60–1.60)

## 2016-08-10 DIAGNOSIS — I5043 Acute on chronic combined systolic (congestive) and diastolic (congestive) heart failure: Secondary | ICD-10-CM | POA: Diagnosis not present

## 2016-08-10 DIAGNOSIS — I429 Cardiomyopathy, unspecified: Secondary | ICD-10-CM | POA: Diagnosis not present

## 2016-08-10 DIAGNOSIS — I482 Chronic atrial fibrillation: Secondary | ICD-10-CM | POA: Diagnosis not present

## 2016-08-10 DIAGNOSIS — J449 Chronic obstructive pulmonary disease, unspecified: Secondary | ICD-10-CM | POA: Diagnosis not present

## 2016-08-10 DIAGNOSIS — N183 Chronic kidney disease, stage 3 (moderate): Secondary | ICD-10-CM | POA: Diagnosis not present

## 2016-08-10 DIAGNOSIS — I130 Hypertensive heart and chronic kidney disease with heart failure and stage 1 through stage 4 chronic kidney disease, or unspecified chronic kidney disease: Secondary | ICD-10-CM | POA: Diagnosis not present

## 2016-08-10 NOTE — Patient Outreach (Signed)
Transition of care call. Pt is doing fair. She had a fall a couple of days ago and sustained a laceration on a finger and was seen in the ED. She reports her finger is doing fine. She also saw her primary care MD this week. He did labs and found that she has a low TSH and a high T4. He is going to refer her to an endocrinologist. She reports her wt. Is stable 112 today. She denies SOB or edema.  I will be following up with her in week.  Deloria Lair [facility] [place] [phone]

## 2016-08-11 ENCOUNTER — Telehealth: Payer: Self-pay | Admitting: *Deleted

## 2016-08-11 DIAGNOSIS — I130 Hypertensive heart and chronic kidney disease with heart failure and stage 1 through stage 4 chronic kidney disease, or unspecified chronic kidney disease: Secondary | ICD-10-CM | POA: Diagnosis not present

## 2016-08-11 DIAGNOSIS — I429 Cardiomyopathy, unspecified: Secondary | ICD-10-CM | POA: Diagnosis not present

## 2016-08-11 DIAGNOSIS — N183 Chronic kidney disease, stage 3 (moderate): Secondary | ICD-10-CM | POA: Diagnosis not present

## 2016-08-11 DIAGNOSIS — I482 Chronic atrial fibrillation: Secondary | ICD-10-CM | POA: Diagnosis not present

## 2016-08-11 DIAGNOSIS — I5043 Acute on chronic combined systolic (congestive) and diastolic (congestive) heart failure: Secondary | ICD-10-CM | POA: Diagnosis not present

## 2016-08-11 DIAGNOSIS — J449 Chronic obstructive pulmonary disease, unspecified: Secondary | ICD-10-CM | POA: Diagnosis not present

## 2016-08-11 NOTE — Telephone Encounter (Signed)
Caller is requesting verbal order to remove 4 stitches from patient's right 3rd finger that she received from a fall on Tuesday at the ER. Per the ER doc they can come out on next Tuesday.  Please advise.

## 2016-08-11 NOTE — Telephone Encounter (Signed)
In 11-12 days Thx

## 2016-08-11 NOTE — Telephone Encounter (Signed)
Left detailed mess informing Jenny Reichmann of PCP's advisement.

## 2016-08-15 DIAGNOSIS — I5043 Acute on chronic combined systolic (congestive) and diastolic (congestive) heart failure: Secondary | ICD-10-CM | POA: Diagnosis not present

## 2016-08-15 DIAGNOSIS — J449 Chronic obstructive pulmonary disease, unspecified: Secondary | ICD-10-CM | POA: Diagnosis not present

## 2016-08-15 DIAGNOSIS — I130 Hypertensive heart and chronic kidney disease with heart failure and stage 1 through stage 4 chronic kidney disease, or unspecified chronic kidney disease: Secondary | ICD-10-CM | POA: Diagnosis not present

## 2016-08-15 DIAGNOSIS — I482 Chronic atrial fibrillation: Secondary | ICD-10-CM | POA: Diagnosis not present

## 2016-08-15 DIAGNOSIS — I429 Cardiomyopathy, unspecified: Secondary | ICD-10-CM | POA: Diagnosis not present

## 2016-08-15 DIAGNOSIS — N183 Chronic kidney disease, stage 3 (moderate): Secondary | ICD-10-CM | POA: Diagnosis not present

## 2016-08-16 ENCOUNTER — Other Ambulatory Visit: Payer: Self-pay | Admitting: *Deleted

## 2016-08-16 DIAGNOSIS — N183 Chronic kidney disease, stage 3 (moderate): Secondary | ICD-10-CM | POA: Diagnosis not present

## 2016-08-16 DIAGNOSIS — J449 Chronic obstructive pulmonary disease, unspecified: Secondary | ICD-10-CM | POA: Diagnosis not present

## 2016-08-16 DIAGNOSIS — I5043 Acute on chronic combined systolic (congestive) and diastolic (congestive) heart failure: Secondary | ICD-10-CM | POA: Diagnosis not present

## 2016-08-16 DIAGNOSIS — I429 Cardiomyopathy, unspecified: Secondary | ICD-10-CM | POA: Diagnosis not present

## 2016-08-16 DIAGNOSIS — I130 Hypertensive heart and chronic kidney disease with heart failure and stage 1 through stage 4 chronic kidney disease, or unspecified chronic kidney disease: Secondary | ICD-10-CM | POA: Diagnosis not present

## 2016-08-16 DIAGNOSIS — I482 Chronic atrial fibrillation: Secondary | ICD-10-CM | POA: Diagnosis not present

## 2016-08-16 NOTE — Patient Outreach (Signed)
Mrs. Mia Contreras is doing quite well. She is currently with her home health nurse. They will be following her into February. She reports her wt. Is stable and denies SOB, edema.  I reminded her that I will be calling weekly and that she can call me if she has any problems.  Deloria Lair [facility] [place] [phone]

## 2016-08-17 DIAGNOSIS — D631 Anemia in chronic kidney disease: Secondary | ICD-10-CM | POA: Diagnosis not present

## 2016-08-17 DIAGNOSIS — N184 Chronic kidney disease, stage 4 (severe): Secondary | ICD-10-CM | POA: Diagnosis not present

## 2016-08-17 DIAGNOSIS — N2581 Secondary hyperparathyroidism of renal origin: Secondary | ICD-10-CM | POA: Diagnosis not present

## 2016-08-17 DIAGNOSIS — I129 Hypertensive chronic kidney disease with stage 1 through stage 4 chronic kidney disease, or unspecified chronic kidney disease: Secondary | ICD-10-CM | POA: Diagnosis not present

## 2016-08-23 ENCOUNTER — Other Ambulatory Visit: Payer: Self-pay | Admitting: *Deleted

## 2016-08-23 ENCOUNTER — Encounter: Payer: Self-pay | Admitting: *Deleted

## 2016-08-23 DIAGNOSIS — I130 Hypertensive heart and chronic kidney disease with heart failure and stage 1 through stage 4 chronic kidney disease, or unspecified chronic kidney disease: Secondary | ICD-10-CM | POA: Diagnosis not present

## 2016-08-23 DIAGNOSIS — I5043 Acute on chronic combined systolic (congestive) and diastolic (congestive) heart failure: Secondary | ICD-10-CM | POA: Diagnosis not present

## 2016-08-23 DIAGNOSIS — N183 Chronic kidney disease, stage 3 (moderate): Secondary | ICD-10-CM | POA: Diagnosis not present

## 2016-08-23 DIAGNOSIS — I482 Chronic atrial fibrillation: Secondary | ICD-10-CM | POA: Diagnosis not present

## 2016-08-23 DIAGNOSIS — I429 Cardiomyopathy, unspecified: Secondary | ICD-10-CM | POA: Diagnosis not present

## 2016-08-23 DIAGNOSIS — J449 Chronic obstructive pulmonary disease, unspecified: Secondary | ICD-10-CM | POA: Diagnosis not present

## 2016-08-23 NOTE — Patient Outreach (Signed)
Transition of care call. This call completes the first month of her transition of care call program. I will call every other week for the second month. Mia Contreras says she is well except for having a "sniffle."  She denies chest congestion and SOB. Her wt is stable and was 112 today.She is eating well and healthy. She denies any edema. She saw her nephrologist and got a good report from him. She is still receiving home PT.   I have advised her we will now go into the second phase of the transition of care program where I will call her in 2 weeks and then again in 4 weeks. I have reinforced to her to call me if she has any problems or questions.  Deloria Lair [facility] [place] [phone]

## 2016-08-24 DIAGNOSIS — J449 Chronic obstructive pulmonary disease, unspecified: Secondary | ICD-10-CM | POA: Diagnosis not present

## 2016-08-24 DIAGNOSIS — I482 Chronic atrial fibrillation: Secondary | ICD-10-CM | POA: Diagnosis not present

## 2016-08-24 DIAGNOSIS — I5043 Acute on chronic combined systolic (congestive) and diastolic (congestive) heart failure: Secondary | ICD-10-CM | POA: Diagnosis not present

## 2016-08-24 DIAGNOSIS — I130 Hypertensive heart and chronic kidney disease with heart failure and stage 1 through stage 4 chronic kidney disease, or unspecified chronic kidney disease: Secondary | ICD-10-CM | POA: Diagnosis not present

## 2016-08-24 DIAGNOSIS — I429 Cardiomyopathy, unspecified: Secondary | ICD-10-CM | POA: Diagnosis not present

## 2016-08-24 DIAGNOSIS — N183 Chronic kidney disease, stage 3 (moderate): Secondary | ICD-10-CM | POA: Diagnosis not present

## 2016-08-25 ENCOUNTER — Ambulatory Visit (INDEPENDENT_AMBULATORY_CARE_PROVIDER_SITE_OTHER): Payer: Medicare Other | Admitting: Endocrinology

## 2016-08-25 ENCOUNTER — Encounter: Payer: Self-pay | Admitting: Endocrinology

## 2016-08-25 VITALS — BP 132/80 | HR 74 | Ht 60.0 in | Wt 116.0 lb

## 2016-08-25 DIAGNOSIS — E042 Nontoxic multinodular goiter: Secondary | ICD-10-CM

## 2016-08-25 DIAGNOSIS — I429 Cardiomyopathy, unspecified: Secondary | ICD-10-CM | POA: Diagnosis not present

## 2016-08-25 DIAGNOSIS — I130 Hypertensive heart and chronic kidney disease with heart failure and stage 1 through stage 4 chronic kidney disease, or unspecified chronic kidney disease: Secondary | ICD-10-CM | POA: Diagnosis not present

## 2016-08-25 DIAGNOSIS — N183 Chronic kidney disease, stage 3 (moderate): Secondary | ICD-10-CM | POA: Diagnosis not present

## 2016-08-25 DIAGNOSIS — I482 Chronic atrial fibrillation: Secondary | ICD-10-CM | POA: Diagnosis not present

## 2016-08-25 DIAGNOSIS — J449 Chronic obstructive pulmonary disease, unspecified: Secondary | ICD-10-CM | POA: Diagnosis not present

## 2016-08-25 DIAGNOSIS — I5043 Acute on chronic combined systolic (congestive) and diastolic (congestive) heart failure: Secondary | ICD-10-CM | POA: Diagnosis not present

## 2016-08-25 NOTE — Patient Instructions (Addendum)

## 2016-08-25 NOTE — Progress Notes (Signed)
Subjective:    Patient ID: Mia Contreras, female    DOB: 05/31/1929, 83 y.o.   MRN: [id number]  HPI Pt returns for f/u of large multinodular goiter (dx'ed 2008; bx then showed HYPERPLASTICNODULE. in 2011, pt had RAI for hyperthyroidism; she became euthyroid, and has not required any medication; last [name] in early 2015 was unchanged).  pt states she feels well in general, and does not notice any change in the goiter.   Past Medical History:  Diagnosis Date  . AKI (acute kidney injury) ([place]) 01/2016  . ANEMIA-NOS 08/20/2007  . ANXIETY 03/16/2007  . BREAST CANCER, HX OF 03/16/2007  . CARPAL TUNNEL SYNDROME, RIGHT 01/11/2008  . Cataract    floaters  . CELLULITIS, LEG, RIGHT 08/20/2007  . Collagenous colitis   . COPD (chronic obstructive pulmonary disease) ([place])   . DIVERTICULOSIS, COLON 08/20/2007  . GOITER, MULTINODULAR 05/06/2010   Dr [name]  . HYPERLIPIDEMIA 08/20/2007  . HYPERTENSION 03/16/2007  . HYPERTHYROIDISM 05/23/2010  . IBS (irritable bowel syndrome)   . OSTEOARTHRITIS 03/16/2007  . OSTEOPOROSIS 08/20/2007  . Pneumonia 07/2011   took abx for several weeks  . PREMATURE ATRIAL CONTRACTIONS 09/21/2010  . SCOLIOSIS 01/11/2008  . SKIN CANCER, HX OF 11/12/2008    R cheek 2010, L Cheek 2011 Dr. Tonia Brooms  . Thyroid nodule   . VENOUS INSUFFICIENCY 09/05/2007    Past Surgical History:  Procedure Laterality Date  . ABDOMINAL HYSTERECTOMY    . APPENDECTOMY  1972  . BREAST LUMPECTOMY Left 2001  . CARPAL TUNNEL RELEASE    . CATARACT EXTRACTION    . EYE SURGERY     cataract ext  . HERNIA REPAIR    . MASTECTOMY, PARTIAL Left   . SKIN CANCER EXCISION     face-Dr. Bing Plume  . TOTAL KNEE ARTHROPLASTY    . TOTAL KNEE ARTHROPLASTY Right 2010   Dr Para March  . TOTAL KNEE ARTHROPLASTY  10/30/2011   Procedure: TOTAL KNEE ARTHROPLASTY;  Surgeon: Lorn Junes, MD;  Location: [facility];  Service: Orthopedics;  Laterality: Left;     . XRT     chemo, surgery    Social History   Social History    . Marital status: Single    Spouse name: N/A  . Number of children: N/A  . Years of education: N/A   Occupational History  . Not on file.   Social History Main Topics  . Smoking status: Former Smoker    Quit date: 12/20/1980  . Smokeless tobacco: Never Used  . Alcohol use Yes     Comment: socially  . Drug use: No  . Sexual activity: Not Currently   Other Topics Concern  . Not on file   Social History Narrative  . No narrative on file    Current Outpatient Prescriptions on File Prior to Visit  Medication Sig Dispense Refill  . ALPRAZolam (XANAX) 0.5 MG tablet Take 1 tablet (0.5 mg total) by mouth 2 (two) times daily as needed for anxiety. 20 tablet 0  . apixaban (ELIQUIS) 2.5 MG TABS tablet Take 2.5 mg by mouth 2 (two) times daily.    . Calcium Carbonate-Vitamin D (CALCIUM 600+D) 600-400 MG-UNIT per tablet Take 1 tablet by mouth daily.    . Fluticasone-Salmeterol (ADVAIR DISKUS) 250-50 MCG/DOSE AEPB Inhale 1 puff into the lungs 2 (two) times daily. 60 each 3  . Multiple Vitamins-Minerals (MULTIVITAMIN PO) Take 1 tablet by mouth daily.     Marland Kitchen torsemide (DEMADEX) 20 MG tablet Take 2  tablets (40 mg total) by mouth 2 (two) times daily. 120 tablet 6  . vitamin C (ASCORBIC ACID) 500 MG tablet Take 1 tablet (500 mg total) by mouth daily. 100 tablet 3   No current facility-administered medications on file prior to visit.     Allergies  Allergen Reactions  . Cefuroxime Diarrhea  . Celecoxib Nausea Only  . Deltasone [Prednisone]     Oral deltasone is causing swelling (can use Depo-Medrol)  . Ranitidine     bloating  . Spironolactone     High K  . Tramadol Hcl Nausea Only    Family History  Problem Relation Age of Onset  . Dementia Mother   . Heart disease Mother   . Mental retardation Mother   . Hypertension Mother   . Alzheimer's disease Mother   . Heart attack Father   . Alcohol abuse Father   . Prostate cancer Brother   . Diabetes Other   . Colon cancer Neg Hx    . Anesthesia problems Neg Hx     BP 132/80   Pulse 74   Ht 5' (1.524 m)   Wt 116 lb (52.6 kg)   SpO2 91%   BMI 22.65 kg/m    Review of Systems No weight change    Objective:   Physical Exam VITAL SIGNS:  See vs page GENERAL: no distress Neck: large multinodular goiter (L>R) is again noted.     Lab Results  Component Value Date   TSH 0.03 (L) 08/09/2016      Assessment & Plan:  Multinodular goiter, clinically unchanged. Hyperthyroidism, recurrent, years after RAI.   Patient is advised the following: Patient Instructions  let's check a thyroid "scan" (a special, but easy and painless type of thyroid x ray).  It works like this: you go to the x-ray department of the hospital to swallow a pill, which contains a miniscule amount of radiation.  You will not notice any symptoms from this.  You will go back to the x-ray department the next day, to lie down in front of a camera.  The results of this will be sent to me.   Based on the results, i hope to order for you a treatment pill of radioactive iodine.  Although it is a larger amount of radiation, you will again notice no symptoms from this.  The pill is gone from your body in a few days (during which you should stay away from other people), but takes several months to work.  Therefore, please return here approximately 6-8 weeks after the treatment.  This treatment has been available for many years, and the only known side-effect is an underactive thyroid.  It is possible that i would eventually prescribe for you a thyroid hormone pill, which is very inexpensive.  You don't have to worry about side-effects of this thyroid hormone pill, because it is the same molecule your thyroid makes.

## 2016-08-29 DIAGNOSIS — N183 Chronic kidney disease, stage 3 (moderate): Secondary | ICD-10-CM | POA: Diagnosis not present

## 2016-08-29 DIAGNOSIS — I482 Chronic atrial fibrillation: Secondary | ICD-10-CM | POA: Diagnosis not present

## 2016-08-29 DIAGNOSIS — I130 Hypertensive heart and chronic kidney disease with heart failure and stage 1 through stage 4 chronic kidney disease, or unspecified chronic kidney disease: Secondary | ICD-10-CM | POA: Diagnosis not present

## 2016-08-29 DIAGNOSIS — I429 Cardiomyopathy, unspecified: Secondary | ICD-10-CM | POA: Diagnosis not present

## 2016-08-29 DIAGNOSIS — I5043 Acute on chronic combined systolic (congestive) and diastolic (congestive) heart failure: Secondary | ICD-10-CM | POA: Diagnosis not present

## 2016-08-29 DIAGNOSIS — J449 Chronic obstructive pulmonary disease, unspecified: Secondary | ICD-10-CM | POA: Diagnosis not present

## 2016-08-30 ENCOUNTER — Other Ambulatory Visit: Payer: Self-pay | Admitting: *Deleted

## 2016-08-30 ENCOUNTER — Encounter (HOSPITAL_COMMUNITY)
Admission: RE | Admit: 2016-08-30 | Discharge: 2016-08-30 | Disposition: A | Discharge status: Home / Self Care | Payer: Medicare Other | Source: Ambulatory Visit | Attending: Endocrinology | Admitting: Endocrinology

## 2016-08-30 DIAGNOSIS — E042 Nontoxic multinodular goiter: Secondary | ICD-10-CM | POA: Insufficient documentation

## 2016-08-30 MED ORDER — SODIUM IODIDE I 131 CAPSULE
7.0100 | Freq: Once | INTRAVENOUS | Status: AC | PRN
  Administered 2016-08-30: 7.01 via ORAL

## 2016-08-30 NOTE — Patient Outreach (Signed)
Transtiion of care call. Mia Contreras denies any CHF exacerbation sxs. She is now concentrating on addressing her goiter and hyper thyroidism. She saw Dr. [name] on 08/24/16 and today is going to the hospital to get a radioactive tablet to swallow. She will return tomorrow for a scan. Dr. [name] will then determine her preferred treatment.   I have advised Mia Contreras that I would like to call her every other week through this month and February to ensure she is doing well and address any health issues that may arise.  She tells me her home health nurse will also still be seeing her for some time.  Deloria Lair [facility] [place] [phone]

## 2016-08-31 ENCOUNTER — Other Ambulatory Visit: Payer: Self-pay | Admitting: Endocrinology

## 2016-08-31 ENCOUNTER — Encounter (HOSPITAL_COMMUNITY)
Admission: RE | Admit: 2016-08-31 | Discharge: 2016-08-31 | Disposition: A | Discharge status: Home / Self Care | Payer: Medicare Other | Source: Ambulatory Visit | Attending: Endocrinology | Admitting: Endocrinology

## 2016-08-31 DIAGNOSIS — E0520 Thyrotoxicosis with toxic multinodular goiter without thyrotoxic crisis or storm: Secondary | ICD-10-CM

## 2016-08-31 DIAGNOSIS — E042 Nontoxic multinodular goiter: Secondary | ICD-10-CM | POA: Diagnosis not present

## 2016-08-31 MED ORDER — TECHNETIUM TC 99M SULFUR COLLOID
9.9000 | Freq: Once | INTRAVENOUS | Status: AC | PRN
  Administered 2016-08-31: 9.9 via INTRAVENOUS

## 2016-09-01 ENCOUNTER — Telehealth: Payer: Self-pay | Admitting: Endocrinology

## 2016-09-01 ENCOUNTER — Other Ambulatory Visit (INDEPENDENT_AMBULATORY_CARE_PROVIDER_SITE_OTHER): Payer: Medicare Other

## 2016-09-01 DIAGNOSIS — E0520 Thyrotoxicosis with toxic multinodular goiter without thyrotoxic crisis or storm: Secondary | ICD-10-CM | POA: Diagnosis not present

## 2016-09-01 LAB — T4, FREE: FREE T4: 1.91 ng/dL — AB (ref 0.60–1.60)

## 2016-09-01 LAB — TSH: TSH: 0.04 u[IU]/mL — ABNORMAL LOW (ref 0.35–4.50)

## 2016-09-01 NOTE — Telephone Encounter (Signed)
You had the test, and the plan was for the treatment.  However, the test showed that your thyroid colleced very little of the iodine.  This raises the question that your overactive iodine has improved on its own so we should check.  The other possibility is that you are somehow receiving iodine, to please let us know if you are aware of this, and please do the blood test.  I hope this helps.

## 2016-09-01 NOTE — Telephone Encounter (Signed)
Pt stated that she was told after her RAI that she was needing to come back for lab work and she is really confused as to what exactly she needs to do.

## 2016-09-01 NOTE — Telephone Encounter (Signed)
See message and please advise, Thanks!  

## 2016-09-02 LAB — THYROGLOBULIN LEVEL: THYROGLOBULIN: 31.1 ng/mL

## 2016-09-02 LAB — THYROGLOBULIN ANTIBODY

## 2016-09-04 ENCOUNTER — Other Ambulatory Visit: Payer: Self-pay | Admitting: Endocrinology

## 2016-09-04 MED ORDER — METHIMAZOLE 10 MG PO TABS: 10.0000 mg | ORAL_TABLET | Freq: Two times a day (BID) | ORAL | 2 refills | Status: DC

## 2016-09-05 DIAGNOSIS — I5043 Acute on chronic combined systolic (congestive) and diastolic (congestive) heart failure: Secondary | ICD-10-CM | POA: Diagnosis not present

## 2016-09-05 DIAGNOSIS — I429 Cardiomyopathy, unspecified: Secondary | ICD-10-CM | POA: Diagnosis not present

## 2016-09-05 DIAGNOSIS — I130 Hypertensive heart and chronic kidney disease with heart failure and stage 1 through stage 4 chronic kidney disease, or unspecified chronic kidney disease: Secondary | ICD-10-CM | POA: Diagnosis not present

## 2016-09-05 DIAGNOSIS — N183 Chronic kidney disease, stage 3 (moderate): Secondary | ICD-10-CM | POA: Diagnosis not present

## 2016-09-05 DIAGNOSIS — I482 Chronic atrial fibrillation: Secondary | ICD-10-CM | POA: Diagnosis not present

## 2016-09-05 DIAGNOSIS — J449 Chronic obstructive pulmonary disease, unspecified: Secondary | ICD-10-CM | POA: Diagnosis not present

## 2016-09-05 NOTE — Telephone Encounter (Signed)
I contacted the patient and advised of message. She stated she was not aware of any extra iodine she has been receiving. Patient stated she has picked the methimazole up and had no further questions at this time.

## 2016-09-13 ENCOUNTER — Encounter: Payer: Self-pay | Admitting: *Deleted

## 2016-09-13 ENCOUNTER — Other Ambulatory Visit: Payer: Self-pay | Admitting: *Deleted

## 2016-09-13 DIAGNOSIS — H5213 Myopia, bilateral: Secondary | ICD-10-CM | POA: Diagnosis not present

## 2016-09-13 DIAGNOSIS — H52223 Regular astigmatism, bilateral: Secondary | ICD-10-CM | POA: Diagnosis not present

## 2016-09-13 DIAGNOSIS — H35373 Puckering of macula, bilateral: Secondary | ICD-10-CM | POA: Diagnosis not present

## 2016-09-13 DIAGNOSIS — H524 Presbyopia: Secondary | ICD-10-CM | POA: Diagnosis not present

## 2016-09-13 DIAGNOSIS — H04123 Dry eye syndrome of bilateral lacrimal glands: Secondary | ICD-10-CM | POA: Diagnosis not present

## 2016-09-13 NOTE — Patient Outreach (Signed)
Final transition of care call. Pt is actually at her opthamolgist office waiting her turn. She says she is fine. No CHF exacerbation sxs. I have advised her that since she is doing so well I am going to close her case. I will send her and her primary care provider. a closure letter  Mia Contreras [facility] [place] [phone]

## 2016-09-15 DIAGNOSIS — N183 Chronic kidney disease, stage 3 (moderate): Secondary | ICD-10-CM | POA: Diagnosis not present

## 2016-09-15 DIAGNOSIS — I429 Cardiomyopathy, unspecified: Secondary | ICD-10-CM | POA: Diagnosis not present

## 2016-09-15 DIAGNOSIS — I130 Hypertensive heart and chronic kidney disease with heart failure and stage 1 through stage 4 chronic kidney disease, or unspecified chronic kidney disease: Secondary | ICD-10-CM | POA: Diagnosis not present

## 2016-09-15 DIAGNOSIS — I5043 Acute on chronic combined systolic (congestive) and diastolic (congestive) heart failure: Secondary | ICD-10-CM | POA: Diagnosis not present

## 2016-09-15 DIAGNOSIS — J449 Chronic obstructive pulmonary disease, unspecified: Secondary | ICD-10-CM | POA: Diagnosis not present

## 2016-09-15 DIAGNOSIS — I482 Chronic atrial fibrillation: Secondary | ICD-10-CM | POA: Diagnosis not present

## 2016-09-22 ENCOUNTER — Telehealth: Payer: Self-pay | Admitting: Internal Medicine

## 2016-09-22 DIAGNOSIS — J449 Chronic obstructive pulmonary disease, unspecified: Secondary | ICD-10-CM | POA: Diagnosis not present

## 2016-09-22 DIAGNOSIS — N183 Chronic kidney disease, stage 3 (moderate): Secondary | ICD-10-CM | POA: Diagnosis not present

## 2016-09-22 DIAGNOSIS — I482 Chronic atrial fibrillation: Secondary | ICD-10-CM | POA: Diagnosis not present

## 2016-09-22 DIAGNOSIS — I5043 Acute on chronic combined systolic (congestive) and diastolic (congestive) heart failure: Secondary | ICD-10-CM | POA: Diagnosis not present

## 2016-09-22 DIAGNOSIS — I429 Cardiomyopathy, unspecified: Secondary | ICD-10-CM | POA: Diagnosis not present

## 2016-09-22 DIAGNOSIS — I130 Hypertensive heart and chronic kidney disease with heart failure and stage 1 through stage 4 chronic kidney disease, or unspecified chronic kidney disease: Secondary | ICD-10-CM | POA: Diagnosis not present

## 2016-09-22 NOTE — Telephone Encounter (Signed)
Needs to clarify dosage on torsemide.

## 2016-09-25 DIAGNOSIS — N183 Chronic kidney disease, stage 3 (moderate): Secondary | ICD-10-CM | POA: Diagnosis not present

## 2016-09-25 DIAGNOSIS — I429 Cardiomyopathy, unspecified: Secondary | ICD-10-CM | POA: Diagnosis not present

## 2016-09-25 DIAGNOSIS — I5043 Acute on chronic combined systolic (congestive) and diastolic (congestive) heart failure: Secondary | ICD-10-CM | POA: Diagnosis not present

## 2016-09-25 DIAGNOSIS — I482 Chronic atrial fibrillation: Secondary | ICD-10-CM | POA: Diagnosis not present

## 2016-09-25 DIAGNOSIS — I130 Hypertensive heart and chronic kidney disease with heart failure and stage 1 through stage 4 chronic kidney disease, or unspecified chronic kidney disease: Secondary | ICD-10-CM | POA: Diagnosis not present

## 2016-09-25 DIAGNOSIS — J449 Chronic obstructive pulmonary disease, unspecified: Secondary | ICD-10-CM | POA: Diagnosis not present

## 2016-09-27 ENCOUNTER — Ambulatory Visit: Payer: Medicare Other | Admitting: *Deleted

## 2016-10-04 ENCOUNTER — Other Ambulatory Visit (INDEPENDENT_AMBULATORY_CARE_PROVIDER_SITE_OTHER): Payer: Medicare Other

## 2016-10-04 ENCOUNTER — Other Ambulatory Visit: Payer: Self-pay | Admitting: *Deleted

## 2016-10-04 ENCOUNTER — Encounter: Payer: Self-pay | Admitting: Internal Medicine

## 2016-10-04 ENCOUNTER — Ambulatory Visit (INDEPENDENT_AMBULATORY_CARE_PROVIDER_SITE_OTHER): Payer: Medicare Other | Admitting: Internal Medicine

## 2016-10-04 VITALS — BP 110/68 | HR 69 | Temp 97.7°F | Resp 16 | Ht 60.0 in | Wt 117.0 lb

## 2016-10-04 DIAGNOSIS — S81801A Unspecified open wound, right lower leg, initial encounter: Secondary | ICD-10-CM

## 2016-10-04 DIAGNOSIS — N184 Chronic kidney disease, stage 4 (severe): Secondary | ICD-10-CM

## 2016-10-04 DIAGNOSIS — R0902 Hypoxemia: Secondary | ICD-10-CM

## 2016-10-04 DIAGNOSIS — I482 Chronic atrial fibrillation, unspecified: Secondary | ICD-10-CM

## 2016-10-04 DIAGNOSIS — I5022 Chronic systolic (congestive) heart failure: Secondary | ICD-10-CM

## 2016-10-04 DIAGNOSIS — J449 Chronic obstructive pulmonary disease, unspecified: Secondary | ICD-10-CM | POA: Diagnosis not present

## 2016-10-04 DIAGNOSIS — E440 Moderate protein-calorie malnutrition: Secondary | ICD-10-CM

## 2016-10-04 LAB — CBC WITH DIFFERENTIAL/PLATELET
BASOS ABS: 0.1 10*3/uL (ref 0.0–0.1)
Basophils Relative: 1.5 % (ref 0.0–3.0)
EOS PCT: 2.9 % (ref 0.0–5.0)
Eosinophils Absolute: 0.2 10*3/uL (ref 0.0–0.7)
HCT: 39.5 % (ref 36.0–46.0)
HEMOGLOBIN: 13 g/dL (ref 12.0–15.0)
Lymphocytes Relative: 22.2 % (ref 12.0–46.0)
Lymphs Abs: 1.8 10*3/uL (ref 0.7–4.0)
MCHC: 33.1 g/dL (ref 30.0–36.0)
MCV: 89.6 fl (ref 78.0–100.0)
MONOS PCT: 10.8 % (ref 3.0–12.0)
Monocytes Absolute: 0.9 10*3/uL (ref 0.1–1.0)
Neutro Abs: 4.9 10*3/uL (ref 1.4–7.7)
Neutrophils Relative %: 62.6 % (ref 43.0–77.0)
Platelets: 360 10*3/uL (ref 150.0–400.0)
RBC: 4.4 Mil/uL (ref 3.87–5.11)
RDW: 15.4 % (ref 11.5–15.5)
WBC: 7.9 10*3/uL (ref 4.0–10.5)

## 2016-10-04 LAB — HEPATIC FUNCTION PANEL
ALBUMIN: 4.3 g/dL (ref 3.5–5.2)
ALK PHOS: 132 U/L — AB (ref 39–117)
ALT: 21 U/L (ref 0–35)
AST: 25 U/L (ref 0–37)
Bilirubin, Direct: 0.1 mg/dL (ref 0.0–0.3)
Total Bilirubin: 0.8 mg/dL (ref 0.2–1.2)
Total Protein: 7.3 g/dL (ref 6.0–8.3)

## 2016-10-04 LAB — BASIC METABOLIC PANEL
BUN: 17 mg/dL (ref 6–23)
CO2: 37 mEq/L — ABNORMAL HIGH (ref 19–32)
Calcium: 8.4 mg/dL (ref 8.4–10.5)
Chloride: 99 mEq/L (ref 96–112)
Creatinine, Ser: 1.17 mg/dL (ref 0.40–1.20)
GFR: 46.7 mL/min — ABNORMAL LOW (ref >60.00)
Glucose, Bld: 100 mg/dL — ABNORMAL HIGH (ref 70–99)
POTASSIUM: 3.8 meq/L (ref 3.5–5.1)
SODIUM: 142 meq/L (ref 135–145)

## 2016-10-04 MED ORDER — TORSEMIDE 20 MG PO TABS: 40.0000 mg | ORAL_TABLET | Freq: Every day | ORAL | 6 refills | Status: DC

## 2016-10-04 NOTE — Assessment & Plan Note (Signed)
Labs

## 2016-10-04 NOTE — Assessment & Plan Note (Signed)
Ensure

## 2016-10-04 NOTE — Assessment & Plan Note (Signed)
Advair prn ?

## 2016-10-04 NOTE — Assessment & Plan Note (Signed)
healed 

## 2016-10-04 NOTE — Assessment & Plan Note (Signed)
Pt is not using O2 Re-start using O2

## 2016-10-04 NOTE — Assessment & Plan Note (Signed)
On Eliquis

## 2016-10-04 NOTE — Assessment & Plan Note (Signed)
Torsemide Eliquis O2 [place]

## 2016-10-04 NOTE — Progress Notes (Signed)
Pre-visit discussion using our clinic review tool. No additional management support is needed unless otherwise documented below in the visit note.  

## 2016-10-04 NOTE — Progress Notes (Signed)
Subjective:  Patient ID: Mia Contreras, female    DOB: [birth year]  Age: 83 y.o. MRN: [id number]  CC: Follow-up (a fib , OA, HTn)   HPI Kenzie Thoreson Halfmann presents for CHF, edema, COPD f/u  Outpatient Medications Prior to Visit  Medication Sig Dispense Refill  . Acetaminophen (TYLENOL ARTHRITIS PAIN PO) Take by mouth.    . ALPRAZolam (XANAX) 0.5 MG tablet Take 1 tablet (0.5 mg total) by mouth 2 (two) times daily as needed for anxiety. 20 tablet 0  . apixaban (ELIQUIS) 2.5 MG TABS tablet Take 2.5 mg by mouth 2 (two) times daily.    . Calcium Carbonate-Vitamin D (CALCIUM 600+D) 600-400 MG-UNIT per tablet Take 1 tablet by mouth daily.    . Fluticasone-Salmeterol (ADVAIR DISKUS) 250-50 MCG/DOSE AEPB Inhale 1 puff into the lungs 2 (two) times daily. 60 each 3  . methimazole (TAPAZOLE) 10 MG tablet Take 1 tablet (10 mg total) by mouth 2 (two) times daily. 60 tablet 2  . Multiple Vitamins-Minerals (MULTIVITAMIN PO) Take 1 tablet by mouth daily.     Marland Kitchen torsemide (DEMADEX) 20 MG tablet Take 2 tablets (40 mg total) by mouth 2 (two) times daily. 120 tablet 6  . vitamin C (ASCORBIC ACID) 500 MG tablet Take 1 tablet (500 mg total) by mouth daily. 100 tablet 3   No facility-administered medications prior to visit.     ROS Review of Systems  Constitutional: Negative for activity change, appetite change, chills, fatigue and unexpected weight change.  HENT: Negative for congestion, mouth sores and sinus pressure.   Eyes: Negative for visual disturbance.  Respiratory: Positive for shortness of breath. Negative for cough and chest tightness.   Gastrointestinal: Negative for abdominal pain and nausea.  Genitourinary: Negative for difficulty urinating, frequency and vaginal pain.  Musculoskeletal: Negative for back pain and gait problem.  Skin: Negative for pallor and rash.  Neurological: Negative for dizziness, tremors, weakness, numbness and headaches.  Psychiatric/Behavioral: Negative for confusion  and sleep disturbance. The patient is not nervous/anxious.     Objective:  BP 110/68   Pulse 69   Temp 97.7 F (36.5 C) (Oral)   Resp 16   Ht 5' (1.524 m)   Wt 117 lb (53.1 kg)   SpO2 93%   BMI 22.85 kg/m   BP Readings from Last 3 Encounters:  10/04/16 110/68  08/25/16 132/80  08/08/16 134/75    Wt Readings from Last 3 Encounters:  10/04/16 117 lb (53.1 kg)  08/25/16 116 lb (52.6 kg)  08/08/16 110 lb (49.9 kg)    Physical Exam  Constitutional: She appears well-developed. No distress.  HENT:  Head: Normocephalic.  Right Ear: External ear normal.  Left Ear: External ear normal.  Nose: Nose normal.  Mouth/Throat: Oropharynx is clear and moist.  Eyes: Conjunctivae are normal. Pupils are equal, round, and reactive to light. Right eye exhibits no discharge. Left eye exhibits no discharge.  Neck: Normal range of motion. Neck supple. No JVD present. No tracheal deviation present. No thyromegaly present.  Cardiovascular: Normal rate, regular rhythm and normal heart sounds.   Pulmonary/Chest: No stridor. No respiratory distress. She has no wheezes.  Abdominal: Soft. Bowel sounds are normal. She exhibits no distension and no mass. There is no tenderness. There is no rebound and no guarding.  Musculoskeletal: She exhibits edema. She exhibits no tenderness.  Lymphadenopathy:    She has no cervical adenopathy.  Neurological: She displays normal reflexes. No cranial nerve deficit. She exhibits normal muscle tone. Coordination  normal.  Skin: No rash noted. No erythema.  Psychiatric: She has a normal mood and affect. Her behavior is normal. Judgment and thought content normal.  trace to no  Edema B  Lab Results  Component Value Date   WBC 6.9 08/03/2016   HGB 12.8 08/03/2016   HCT 38.7 08/03/2016   PLT 317.0 08/03/2016   GLUCOSE 108 (H) 08/09/2016   CHOL 130 11/04/2015   TRIG 77.0 11/04/2015   HDL 54.50 11/04/2015   LDLCALC 60 11/04/2015   ALT 34 08/03/2016   AST 36  08/03/2016   NA 142 08/09/2016   K 3.6 08/09/2016   CL 99 08/09/2016   CREATININE 1.66 (H) 08/09/2016   BUN 25 (H) 08/09/2016   CO2 34 (H) 08/09/2016   TSH 0.04 (L) 09/01/2016   INR 0.95 10/25/2011   HGBA1C 5.8 02/06/2014   MICROALBUR 0.6 03/23/2011    Nm Thyroid Sng Uptake W/imaging  Result Date: 08/31/2016 CLINICAL DATA:  Toxic multinodular goiter. 20 pound weight loss. Prior I 131 therapy on 96/75/9163 with 40 millicuries I 846. TSH equal 0.03 EXAM: THYROID SCAN AND UPTAKE - 24 HOURS TECHNIQUE: Following the per oral administration of I-131 sodium iodide, the patient returned at 24 hours and uptake measurements were acquired with the uptake probe centered on the neck. Thyroid imaging was performed following the intravenous administration of the Tc-56m Pertechnetate. RADIOPHARMACEUTICALS:  7.0 MicroCuries I 131 sodium iodide orally and 10.0 mCi Technetium-59m pertechnetate IV COMPARISON:  None FINDINGS: No significant uptake within the thyroid bed 24 hour I 131 uptake = 1.6% (normal 10-30%) IMPRESSION: 1. Thyroid gland is poorly defined on imaging due to poor uptake. 2.  24 hour I 131 uptake = 1.6% (normal 10-30%). 1. Electronically Signed   By: Suzy Bouchard M.D.   On: 08/31/2016 15:07    Assessment & Plan:   There are no diagnoses linked to this encounter. I am having Ms. Gazda maintain her Calcium Carbonate-Vitamin D, Multiple Vitamins-Minerals (MULTIVITAMIN PO), Fluticasone-Salmeterol, torsemide, vitamin C, ALPRAZolam, apixaban, Acetaminophen (TYLENOL ARTHRITIS PAIN PO), and methimazole.  No orders of the defined types were placed in this encounter.    Follow-up: No Follow-up on file.  Walker Kehr, MD

## 2016-10-06 ENCOUNTER — Ambulatory Visit: Payer: Medicare Other | Admitting: Internal Medicine

## 2016-10-10 ENCOUNTER — Ambulatory Visit (INDEPENDENT_AMBULATORY_CARE_PROVIDER_SITE_OTHER): Payer: Medicare Other | Admitting: Endocrinology

## 2016-10-10 VITALS — BP 128/58 | HR 76 | Ht 60.0 in | Wt 116.0 lb

## 2016-10-10 DIAGNOSIS — E0520 Thyrotoxicosis with toxic multinodular goiter without thyrotoxic crisis or storm: Secondary | ICD-10-CM

## 2016-10-10 LAB — TSH: TSH: 0.05 u[IU]/mL — ABNORMAL LOW (ref 0.35–4.50)

## 2016-10-10 LAB — T4, FREE: FREE T4: 1.8 ng/dL — AB (ref 0.60–1.60)

## 2016-10-10 MED ORDER — METHIMAZOLE 10 MG PO TABS: 20.0000 mg | ORAL_TABLET | Freq: Two times a day (BID) | ORAL | 2 refills | Status: DC

## 2016-10-10 NOTE — Progress Notes (Signed)
Subjective:    Patient ID: Mia Contreras, female    DOB: [birth year], 83 y.o.   MRN: [id number]  HPI Pt returns for f/u of hyperthyroidism (due to large multinodular goiter; dx'ed 2008; bx then showed HYPERPLASTICNODULE. in 2011, pt had RAI for hyperthyroidism; she became euthyroid, last [name] in early 2015 was unchanged; in 2017, plan was for repeat RAI, but RAI uptake was low; pt says she does not take any iodine-containing products, so she was rx'ed tapazole).  Since on it, pt states he feels well in general. Past Medical History:  Diagnosis Date  . AKI (acute kidney injury) ([place]) 01/2016  . ANEMIA-NOS 08/20/2007  . ANXIETY 03/16/2007  . BREAST CANCER, HX OF 03/16/2007  . CARPAL TUNNEL SYNDROME, RIGHT 01/11/2008  . Cataract    floaters  . CELLULITIS, LEG, RIGHT 08/20/2007  . Collagenous colitis   . COPD (chronic obstructive pulmonary disease) ([place])   . DIVERTICULOSIS, COLON 08/20/2007  . GOITER, MULTINODULAR 05/06/2010   Dr [name]  . HYPERLIPIDEMIA 08/20/2007  . HYPERTENSION 03/16/2007  . HYPERTHYROIDISM 05/23/2010  . IBS (irritable bowel syndrome)   . OSTEOARTHRITIS 03/16/2007  . OSTEOPOROSIS 08/20/2007  . Pneumonia 07/2011   took abx for several weeks  . PREMATURE ATRIAL CONTRACTIONS 09/21/2010  . SCOLIOSIS 01/11/2008  . SKIN CANCER, HX OF 11/12/2008    R cheek 2010, L Cheek 2011 Dr. Tonia Brooms  . Thyroid nodule   . VENOUS INSUFFICIENCY 09/05/2007    Past Surgical History:  Procedure Laterality Date  . ABDOMINAL HYSTERECTOMY    . APPENDECTOMY  1972  . BREAST LUMPECTOMY Left 2001  . CARPAL TUNNEL RELEASE    . CATARACT EXTRACTION    . EYE SURGERY     cataract ext  . HERNIA REPAIR    . MASTECTOMY, PARTIAL Left   . SKIN CANCER EXCISION     face-Dr. Bing Plume  . TOTAL KNEE ARTHROPLASTY    . TOTAL KNEE ARTHROPLASTY Right 2010   Dr Para March  . TOTAL KNEE ARTHROPLASTY  10/30/2011   Procedure: TOTAL KNEE ARTHROPLASTY;  Surgeon: Lorn Junes, MD;  Location: [place];  Service:  Orthopedics;  Laterality: Left;     . XRT     chemo, surgery    Social History   Social History  . Marital status: Single    Spouse name: N/A  . Number of children: N/A  . Years of education: N/A   Occupational History  . Not on file.   Social History Main Topics  . Smoking status: Former Smoker    Quit date: 12/20/1980  . Smokeless tobacco: Never Used  . Alcohol use Yes     Comment: socially  . Drug use: No  . Sexual activity: Not Currently   Other Topics Concern  . Not on file   Social History Narrative  . No narrative on file    Current Outpatient Prescriptions on File Prior to Visit  Medication Sig Dispense Refill  . Acetaminophen (TYLENOL ARTHRITIS PAIN PO) Take by mouth.    . ALPRAZolam (XANAX) 0.5 MG tablet Take 1 tablet (0.5 mg total) by mouth 2 (two) times daily as needed for anxiety. 20 tablet 0  . apixaban (ELIQUIS) 2.5 MG TABS tablet Take 2.5 mg by mouth 2 (two) times daily.    . Calcium Carbonate-Vitamin D (CALCIUM 600+D) 600-400 MG-UNIT per tablet Take 1 tablet by mouth daily.    . Fluticasone-Salmeterol (ADVAIR DISKUS) 250-50 MCG/DOSE AEPB Inhale 1 puff into the lungs 2 (two) times daily. [place]  each 3  . Multiple Vitamins-Minerals (MULTIVITAMIN PO) Take 1 tablet by mouth daily.     Marland Kitchen torsemide (DEMADEX) 20 MG tablet Take 2 tablets (40 mg total) by mouth daily. 60 tablet 6  . vitamin C (ASCORBIC ACID) 500 MG tablet Take 1 tablet (500 mg total) by mouth daily. 100 tablet 3   No current facility-administered medications on file prior to visit.     Allergies  Allergen Reactions  . Cefuroxime Diarrhea  . Celecoxib Nausea Only  . Deltasone [Prednisone]     Oral deltasone is causing swelling (can use Depo-Medrol)  . Ranitidine     bloating  . Spironolactone     High K  . Tramadol Hcl Nausea Only    Family History  Problem Relation Age of Onset  . Dementia Mother   . Heart disease Mother   . Mental retardation Mother   . Hypertension Mother   .  Alzheimer's disease Mother   . Heart attack Father   . Alcohol abuse Father   . Prostate cancer Brother   . Diabetes Other   . Colon cancer Neg Hx   . Anesthesia problems Neg Hx     BP (!) 128/58   Pulse 76   Ht 5' (1.524 m)   Wt 116 lb (52.6 kg)   SpO2 90%   BMI 22.65 kg/m   Review of Systems Denies fever    Objective:   Physical Exam VITAL SIGNS:  See vs page GENERAL: no distress Neck: large multinodular goiter (L>R) is again noted.  Neuro: no tremor Skin: not diaphoretic.    Lab Results  Component Value Date   TSH 0.05 (L) 10/10/2016      Assessment & Plan:  Hyperthyroidism: she needs increased rx.  I have sent a prescription to your pharmacy.

## 2016-10-10 NOTE — Patient Instructions (Addendum)
Thyroid blood tests are requested for you today.  We'll let you know about the results. If ever you have fever while taking methimazole, stop it and call us, even if the reason is obvious, because of the risk of a rare side-effect.  Please come back for a follow-up appointment in 1 month.

## 2016-10-18 ENCOUNTER — Ambulatory Visit: Payer: Self-pay | Admitting: *Deleted

## 2016-11-08 ENCOUNTER — Ambulatory Visit: Payer: Medicare Other | Admitting: Endocrinology

## 2016-11-14 ENCOUNTER — Telehealth: Payer: Self-pay | Admitting: Internal Medicine

## 2016-11-14 NOTE — Telephone Encounter (Signed)
Pt called in and said that she has 2 refills on left on her alprazolam but it expired yesterday.  She wants new script sent to pharmacy

## 2016-11-14 NOTE — Telephone Encounter (Signed)
Ok to ref x2 Thx

## 2016-11-15 ENCOUNTER — Encounter: Payer: Self-pay | Admitting: Endocrinology

## 2016-11-15 ENCOUNTER — Ambulatory Visit (INDEPENDENT_AMBULATORY_CARE_PROVIDER_SITE_OTHER): Payer: Medicare Other | Admitting: Endocrinology

## 2016-11-15 ENCOUNTER — Other Ambulatory Visit: Payer: Self-pay | Admitting: Internal Medicine

## 2016-11-15 ENCOUNTER — Telehealth: Payer: Self-pay | Admitting: Endocrinology

## 2016-11-15 VITALS — BP 144/82 | HR 92 | Ht 60.0 in | Wt 110.0 lb

## 2016-11-15 DIAGNOSIS — E0520 Thyrotoxicosis with toxic multinodular goiter without thyrotoxic crisis or storm: Secondary | ICD-10-CM | POA: Diagnosis not present

## 2016-11-15 LAB — T4, FREE: FREE T4: 1.18 ng/dL (ref 0.60–1.60)

## 2016-11-15 LAB — TSH: TSH: 0.03 u[IU]/mL — AB (ref 0.35–4.50)

## 2016-11-15 MED ORDER — ALPRAZOLAM 0.5 MG PO TABS: 0.5000 mg | ORAL_TABLET | Freq: Two times a day (BID) | ORAL | 2 refills | Status: DC | PRN

## 2016-11-15 NOTE — Patient Instructions (Signed)
Thyroid blood tests are requested for you today.  We'll let you know about the results. If ever you have fever while taking methimazole, stop it and call us, even if the reason is obvious, because of the risk of a rare side-effect.  Please come back for a follow-up appointment in 2 months.

## 2016-11-15 NOTE — Telephone Encounter (Signed)
Pt was taking something similar to centrum silver with iodine in it, she stopped it 10 days ago, it had 150 mg of iodine in it.

## 2016-11-15 NOTE — Telephone Encounter (Signed)
Multivitamin is ok, but please d/c the iodine part.

## 2016-11-15 NOTE — Telephone Encounter (Signed)
See message to be advised, Thanks! 

## 2016-11-15 NOTE — Telephone Encounter (Signed)
rx printed and faxed to [facility].

## 2016-11-15 NOTE — [place] Notes (Signed)
Subjective:    Patient ID: Mia Contreras, female    DOB: 03/11/1929, 83 y.o.   MRN: [id number]  HPI Pt returns for f/u of hyperthyroidism (due to large multinodular goiter; dx'ed 2008; bx then showed HYPERPLASTICNODULE. in 2011, pt had RAI for hyperthyroidism; she became euthyroid, last [name] in early 2015 was unchanged; in 2017, hyperthyroidism recurred; plan was for repeat RAI, but RAI uptake was low; pt says she does not take any iodine-containing products, so she was rx'ed tapazole).  Since on tapazole, pt states he feels well in general, except for insomnia and anxiety.   Past Medical History:  Diagnosis Date  . AKI (acute kidney injury) ([place]) 01/2016  . ANEMIA-NOS 08/20/2007  . ANXIETY 03/16/2007  . BREAST CANCER, HX OF 03/16/2007  . CARPAL TUNNEL SYNDROME, RIGHT 01/11/2008  . Cataract    floaters  . CELLULITIS, LEG, RIGHT 08/20/2007  . Collagenous colitis   . COPD (chronic obstructive pulmonary disease) ([place])   . DIVERTICULOSIS, COLON 08/20/2007  . GOITER, MULTINODULAR 05/06/2010   Dr [name]  . HYPERLIPIDEMIA 08/20/2007  . HYPERTENSION 03/16/2007  . HYPERTHYROIDISM 05/23/2010  . IBS (irritable bowel syndrome)   . OSTEOARTHRITIS 03/16/2007  . OSTEOPOROSIS 08/20/2007  . Pneumonia 07/2011   took abx for several weeks  . PREMATURE ATRIAL CONTRACTIONS 09/21/2010  . SCOLIOSIS 01/11/2008  . SKIN CANCER, HX OF 11/12/2008    R cheek 2010, L Cheek 2011 Dr. Tonia Brooms  . Thyroid nodule   . VENOUS INSUFFICIENCY 09/05/2007    Past Surgical History:  Procedure Laterality Date  . ABDOMINAL HYSTERECTOMY    . APPENDECTOMY  1972  . BREAST LUMPECTOMY Left 2001  . CARPAL TUNNEL RELEASE    . CATARACT EXTRACTION    . EYE SURGERY     cataract ext  . HERNIA REPAIR    . MASTECTOMY, PARTIAL Left   . SKIN CANCER EXCISION     face-Dr. Bing Plume  . TOTAL KNEE ARTHROPLASTY    . TOTAL KNEE ARTHROPLASTY Right 2010   Dr Para March  . TOTAL KNEE ARTHROPLASTY  10/30/2011   Procedure: TOTAL KNEE ARTHROPLASTY;   Surgeon: Lorn Junes, MD;  Location: [facility];  Service: Orthopedics;  Laterality: Left;     . XRT     chemo, surgery    Social History   Social History  . Marital status: Single    Spouse name: N/A  . Number of children: N/A  . Years of education: N/A   Occupational History  . Not on file.   Social History Main Topics  . Smoking status: Former Smoker    Quit date: 12/20/1980  . Smokeless tobacco: Never Used  . Alcohol use Yes     Comment: socially  . Drug use: No  . Sexual activity: Not Currently   Other Topics Concern  . Not on file   Social History Narrative  . No narrative on file    Current Outpatient Prescriptions on File Prior to Visit  Medication Sig Dispense Refill  . Acetaminophen (TYLENOL ARTHRITIS PAIN PO) Take by mouth.    . ALPRAZolam (XANAX) 0.5 MG tablet Take 1 tablet (0.5 mg total) by mouth 2 (two) times daily as needed for anxiety. 20 tablet 2  . apixaban (ELIQUIS) 2.5 MG TABS tablet Take 2.5 mg by mouth 2 (two) times daily.    . Calcium Carbonate-Vitamin D (CALCIUM 600+D) 600-400 MG-UNIT per tablet Take 1 tablet by mouth daily.    . Fluticasone-Salmeterol (ADVAIR DISKUS) 250-50 MCG/DOSE AEPB Inhale 1  puff into the lungs 2 (two) times daily. 60 each 3  . methimazole (TAPAZOLE) 10 MG tablet Take 2 tablets (20 mg total) by mouth 2 (two) times daily. 120 tablet 2  . Multiple Vitamins-Minerals (MULTIVITAMIN PO) Take 1 tablet by mouth daily.     Marland Kitchen torsemide (DEMADEX) 20 MG tablet Take 2 tablets (40 mg total) by mouth daily. 60 tablet 6  . vitamin C (ASCORBIC ACID) 500 MG tablet Take 1 tablet (500 mg total) by mouth daily. 100 tablet 3   No current facility-administered medications on file prior to visit.     Allergies  Allergen Reactions  . Cefuroxime Diarrhea  . Celecoxib Nausea Only  . Deltasone [Prednisone]     Oral deltasone is causing swelling (can use Depo-Medrol)  . Ranitidine     bloating  . Spironolactone     High K  . Tramadol Hcl  Nausea Only    Family History  Problem Relation Age of Onset  . Dementia Mother   . Heart disease Mother   . Mental retardation Mother   . Hypertension Mother   . Alzheimer's disease Mother   . Heart attack Father   . Alcohol abuse Father   . Prostate cancer Brother   . Diabetes Other   . Colon cancer Neg Hx   . Anesthesia problems Neg Hx     BP (!) 144/82   Pulse 92   Ht 5' (1.524 m)   Wt 110 lb (49.9 kg)   SpO2 91%   BMI 21.48 kg/m    Review of Systems Denies fever.      Objective:   Physical Exam VITAL SIGNS:  See vs page GENERAL: no distress Neck: large multinodular goiter (L>R) is again noted.  Neuro: coarse tremor of the hands.  Skin: not diaphoretic.   Lab Results  Component Value Date   TSH 0.03 (L) 11/15/2016      Assessment & Plan:  Hyperthyroidism: improved.  Please continue the same medication.   Multinodular goiter: clinically unchanged.

## 2016-11-16 NOTE — Telephone Encounter (Signed)
I contacted the patient and advised of message. She voiced understanding and had no further questions at this time.  

## 2016-11-16 NOTE — Telephone Encounter (Signed)
ALPRAZolam (XANAX) 0.5 MG tablet   Patient states the pharmacy does not have this medication. Can we send it again. Thank you.

## 2016-11-16 NOTE — Telephone Encounter (Signed)
Pt called and said that she has not slept in 2 nights she needs an urgent refill on her ALPRAZolam (XANAX) 0.5 MG tablet [[id number]]

## 2016-11-16 NOTE — Telephone Encounter (Signed)
Routing to dr plotnikov, please advise, thanks 

## 2016-11-17 ENCOUNTER — Encounter (HOSPITAL_COMMUNITY): Payer: Self-pay | Admitting: *Deleted

## 2016-11-17 ENCOUNTER — Emergency Department (HOSPITAL_COMMUNITY)
Admission: EM | Admit: 2016-11-17 | Discharge: 2016-11-17 | Disposition: A | Discharge status: Home / Self Care | Payer: Medicare Other | Attending: Emergency Medicine | Admitting: Emergency Medicine

## 2016-11-17 DIAGNOSIS — N184 Chronic kidney disease, stage 4 (severe): Secondary | ICD-10-CM | POA: Insufficient documentation

## 2016-11-17 DIAGNOSIS — J449 Chronic obstructive pulmonary disease, unspecified: Secondary | ICD-10-CM | POA: Insufficient documentation

## 2016-11-17 DIAGNOSIS — Z7901 Long term (current) use of anticoagulants: Secondary | ICD-10-CM | POA: Diagnosis not present

## 2016-11-17 DIAGNOSIS — F419 Anxiety disorder, unspecified: Secondary | ICD-10-CM

## 2016-11-17 DIAGNOSIS — Z760 Encounter for issue of repeat prescription: Secondary | ICD-10-CM | POA: Insufficient documentation

## 2016-11-17 DIAGNOSIS — I130 Hypertensive heart and chronic kidney disease with heart failure and stage 1 through stage 4 chronic kidney disease, or unspecified chronic kidney disease: Secondary | ICD-10-CM | POA: Diagnosis not present

## 2016-11-17 DIAGNOSIS — I5043 Acute on chronic combined systolic (congestive) and diastolic (congestive) heart failure: Secondary | ICD-10-CM | POA: Diagnosis not present

## 2016-11-17 DIAGNOSIS — Z87891 Personal history of nicotine dependence: Secondary | ICD-10-CM | POA: Diagnosis not present

## 2016-11-17 DIAGNOSIS — G4700 Insomnia, unspecified: Secondary | ICD-10-CM

## 2016-11-17 DIAGNOSIS — E039 Hypothyroidism, unspecified: Secondary | ICD-10-CM | POA: Diagnosis not present

## 2016-11-17 DIAGNOSIS — Z96653 Presence of artificial knee joint, bilateral: Secondary | ICD-10-CM | POA: Insufficient documentation

## 2016-11-17 DIAGNOSIS — Z853 Personal history of malignant neoplasm of breast: Secondary | ICD-10-CM | POA: Insufficient documentation

## 2016-11-17 DIAGNOSIS — Z85828 Personal history of other malignant neoplasm of skin: Secondary | ICD-10-CM | POA: Diagnosis not present

## 2016-11-17 MED ORDER — ALPRAZOLAM 0.5 MG PO TABS: 0.5000 mg | ORAL_TABLET | Freq: Every evening | ORAL | 0 refills | Status: DC | PRN

## 2016-11-17 MED ORDER — LORAZEPAM 0.5 MG PO TABS
0.5000 mg | ORAL_TABLET | Freq: Once | ORAL | Status: AC
  Administered 2016-11-17: 0.5 mg via ORAL
  Filled 2016-11-17: qty 1

## 2016-11-17 NOTE — ED Notes (Signed)
Pt spoke to her ride on the phone. Her ride picking her up from lobby.

## 2016-11-17 NOTE — ED Notes (Signed)
ED Provider at bedside. 

## 2016-11-17 NOTE — ED Triage Notes (Signed)
Pt states she ran out of xanax 3 days ago and has been unable to sleep since. Pt states she called her doctor for a refill 2 days ago but has not gotten a new prescription yet.

## 2016-11-17 NOTE — ED Provider Notes (Signed)
[facility] DEPT Provider Note   CSN: [id number] Arrival date & time: 11/17/16  1352     History   Chief Complaint Chief Complaint  Patient presents with  . Insomnia    HPI Mia Contreras is a 83 y.o. female.  HPI   83 year old female requesting prescription for Xanax which she says she takes chronically. Takes primarily as needed for sleep. She read about 3 days ago. She reports that she's been trying to contact her PCP for refill without success. She has no acute complaints otherwise.  Past Medical History:  Diagnosis Date  . AKI (acute kidney injury) ([place]) 01/2016  . ANEMIA-NOS 08/20/2007  . ANXIETY 03/16/2007  . BREAST CANCER, HX OF 03/16/2007  . CARPAL TUNNEL SYNDROME, RIGHT 01/11/2008  . Cataract    floaters  . CELLULITIS, LEG, RIGHT 08/20/2007  . Collagenous colitis   . COPD (chronic obstructive pulmonary disease) ([place])   . DIVERTICULOSIS, COLON 08/20/2007  . GOITER, MULTINODULAR 05/06/2010   Dr [name]  . HYPERLIPIDEMIA 08/20/2007  . HYPERTENSION 03/16/2007  . HYPERTHYROIDISM 05/23/2010  . IBS (irritable bowel syndrome)   . OSTEOARTHRITIS 03/16/2007  . OSTEOPOROSIS 08/20/2007  . Pneumonia 07/2011   took abx for several weeks  . PREMATURE ATRIAL CONTRACTIONS 09/21/2010  . SCOLIOSIS 01/11/2008  . SKIN CANCER, HX OF 11/12/2008    R cheek 2010, L Cheek 2011 Dr. Tonia Brooms  . Thyroid nodule   . VENOUS INSUFFICIENCY 09/05/2007    Patient Active Problem List   Diagnosis Date Noted  . Symptomatic bradycardia   . Acute systolic CHF (congestive heart failure) ([place])   . Hyperkalemia 06/06/2016  . CKD (chronic kidney disease) stage 4, GFR 15-29 ml/min (HCC) 06/06/2016  . Skin tear of left forearm without complication 40/98/1191  . HCAP (healthcare-associated pneumonia) 01/30/2016  . Chronic anticoagulation-Eliquis 01/30/2016  . Hypotension-currently asymptomatic 01/30/2016  . Malnutrition of moderate degree 01/27/2016  . Falls 01/26/2016  . Hypoxia 01/26/2016  .  Acute on chronic diastolic congestive heart failure, NYHA class 4 ([place]) 01/26/2016  . Atrial fibrillation ([place]) 01/11/2016  . PAF- currently in AF on Amiodarone 200 mg BID   . AKI (acute kidney injury) ([place]) 12/14/2015  . Acute on chronic combined systolic and diastolic heart failure ([place]) 12/14/2015  . Acute kidney injury superimposed on chronic kidney disease ([place]) 12/14/2015  . CRF (chronic renal failure) 12/14/2015  . IBS (irritable bowel syndrome) 12/14/2015  . Open leg wound 11/08/2015  . Hiatal hernia 05/12/2015  . Subconjunctival hemorrhage 08/07/2014  . Chronic renal failure 08/07/2014  . Nausea without vomiting 07/15/2014  . Noninfected skin tear of right leg 07/03/2014  . Hypoglycemia 12/12/2013  . Chronic systolic heart failure ([place]) 08/22/2013  . NICM (nonischemic cardiomyopathy) ([place]) 08/22/2013  . Diarrhea 10/01/2012  . Dyspnea on exertion 06/27/2012  . Asthmatic bronchitis 06/27/2012  . UTI (urinary tract infection) 11/02/2011  . Postoperative anemia due to acute blood loss 11/01/2011  . COPD (chronic obstructive pulmonary disease) ([place]) 08/16/2011  . Cough 08/08/2011  . Preop exam for internal medicine 08/02/2011  . Cerumen impaction 08/02/2011  . Bruit 04/19/2011  . Arrhythmia 03/29/2011  . Neoplasm of uncertain behavior of skin 09/21/2010  . PREMATURE ATRIAL CONTRACTIONS 09/21/2010  . CYSTITIS 09/21/2010  . Thyrotoxicosis 05/23/2010  . GOITER, MULTINODULAR 05/06/2010  . TOBACCO USE, QUIT 06/14/2009  . ELECTROCARDIOGRAM, ABNORMAL 04/02/2009  . SKIN CANCER, HX OF 11/12/2008  . CARPAL TUNNEL SYNDROME, RIGHT 01/11/2008  . SCOLIOSIS 01/11/2008  . VENOUS INSUFFICIENCY 09/05/2007  . Edema 09/05/2007  .  Anemia in chronic kidney disease 08/20/2007  . DIVERTICULOSIS, COLON 08/20/2007  . CELLULITIS, LEG, RIGHT 08/20/2007  . OSTEOPOROSIS 08/20/2007  . Open wound of knee, leg, and ankle 07/03/2007  . Anxiety state 03/16/2007  . Essential hypertension 03/16/2007  .  Osteoarthritis 03/16/2007  . hx: breast cancer, left, infiltrating ductal 03/16/2007    Past Surgical History:  Procedure Laterality Date  . ABDOMINAL HYSTERECTOMY    . APPENDECTOMY  1972  . BREAST LUMPECTOMY Left 2001  . CARPAL TUNNEL RELEASE    . CATARACT EXTRACTION    . EYE SURGERY     cataract ext  . HERNIA REPAIR    . MASTECTOMY, PARTIAL Left   . SKIN CANCER EXCISION     face-Dr. Bing Plume  . TOTAL KNEE ARTHROPLASTY    . TOTAL KNEE ARTHROPLASTY Right 2010   Dr Para March  . TOTAL KNEE ARTHROPLASTY  10/30/2011   Procedure: TOTAL KNEE ARTHROPLASTY;  Surgeon: Lorn Junes, MD;  Location: [facility];  Service: Orthopedics;  Laterality: Left;     . XRT     chemo, surgery    OB History    No data available       Home Medications    Prior to Admission medications   Medication Sig Start Date End Date Taking? Authorizing Provider  ALPRAZolam Duanne Moron) 0.5 MG tablet Take 1 tablet (0.5 mg total) by mouth 2 (two) times daily as needed for anxiety. 11/15/16  Yes Aleksei Plotnikov V, MD  apixaban (ELIQUIS) 2.5 MG TABS tablet Take 2.5 mg by mouth 2 (two) times daily.   Yes Historical Provider, MD  Calcium Carbonate-Vitamin D (CALCIUM 600+D) 600-400 MG-UNIT per tablet Take 1 tablet by mouth daily.   Yes Historical Provider, MD  Fluticasone-Salmeterol (ADVAIR DISKUS) 250-50 MCG/DOSE AEPB Inhale 1 puff into the lungs 2 (two) times daily. 05/12/15  Yes Aleksei Plotnikov V, MD  methimazole (TAPAZOLE) 10 MG tablet Take 2 tablets (20 mg total) by mouth 2 (two) times daily. 10/10/16  Yes Renato Shin, MD  torsemide (DEMADEX) 20 MG tablet Take 2 tablets (40 mg total) by mouth daily. 10/04/16  Yes Aleksei Plotnikov V, MD  vitamin C (ASCORBIC ACID) 500 MG tablet Take 1 tablet (500 mg total) by mouth daily. 03/14/16  Yes Cassandria Anger, MD    Family History Family History  Problem Relation Age of Onset  . Dementia Mother   . Heart disease Mother   . Mental retardation Mother   . Hypertension Mother    . Alzheimer's disease Mother   . Heart attack Father   . Alcohol abuse Father   . Prostate cancer Brother   . Diabetes Other   . Colon cancer Neg Hx   . Anesthesia problems Neg Hx     Social History Social History  Substance Use Topics  . Smoking status: Former Smoker    Quit date: 12/20/1980  . Smokeless tobacco: Never Used  . Alcohol use Yes     Comment: socially     Allergies   Cefuroxime; Celecoxib; Deltasone [prednisone]; Ranitidine; Spironolactone; and Tramadol hcl   Review of Systems Review of Systems  All systems reviewed and negative, other than as noted in HPI.  Physical Exam Updated Vital Signs BP (!) 137/95 (BP Location: Right Arm)   Pulse (!) 109   Temp 98.9 F (37.2 C) (Oral)   Resp 16   SpO2 90%   Physical Exam  Constitutional: She appears well-developed and well-nourished. No distress.  HENT:  Head: Normocephalic and atraumatic.  Eyes: Conjunctivae are normal. Right eye exhibits no discharge. Left eye exhibits no discharge.  Neck: Neck supple.  Cardiovascular: Normal rate, regular rhythm and normal heart sounds.  Exam reveals no gallop and no friction rub.   No murmur heard. Pulmonary/Chest: Effort normal and breath sounds normal. No respiratory distress.  Abdominal: Soft. She exhibits no distension. There is no tenderness.  Musculoskeletal: She exhibits no edema or tenderness.  Neurological: She is alert.  Skin: Skin is warm and dry.  Psychiatric: She has a normal mood and affect. Her behavior is normal. Thought content normal.  Nursing note and vitals reviewed.     ED Treatments / Results  Labs (all labs ordered are listed, but only abnormal results are displayed) Labs Reviewed - No data to display  EKG  EKG Interpretation None       Radiology No results found.  Procedures Procedures (including critical care time)  Medications Ordered in ED Medications - No data to display   Initial Impression / Assessment and Plan / ED  Course  I have reviewed the triage vital signs and the nursing notes.  Pertinent labs & imaging results that were available during my care of the patient were reviewed by me and considered in my medical decision making (see chart for details).     87yF presenting with anxiety/insomnia/medication refill. Reports she has chronically been on xanax, but currently out. It is on her medication list, but interestingly does not show up on reviewing the [place] Controlled Substance Reporting System. Somewhat convoluted story about how she cannot get it from her PCP, but in her defense today is Good Friday and many offices are closed.   Final Clinical Impressions(s) / ED Diagnoses   Final diagnoses:  Medication refill  Anxiety  Insomnia, unspecified type    New Prescriptions New Prescriptions   No medications on file     Virgel Manifold, MD 11/20/16 1200

## 2016-11-20 ENCOUNTER — Encounter: Payer: Self-pay | Admitting: Internal Medicine

## 2016-11-20 ENCOUNTER — Telehealth: Payer: Self-pay | Admitting: Internal Medicine

## 2016-11-20 ENCOUNTER — Ambulatory Visit (INDEPENDENT_AMBULATORY_CARE_PROVIDER_SITE_OTHER): Payer: Medicare Other | Admitting: Internal Medicine

## 2016-11-20 DIAGNOSIS — R600 Localized edema: Secondary | ICD-10-CM | POA: Diagnosis not present

## 2016-11-20 DIAGNOSIS — I482 Chronic atrial fibrillation, unspecified: Secondary | ICD-10-CM

## 2016-11-20 DIAGNOSIS — F419 Anxiety disorder, unspecified: Secondary | ICD-10-CM | POA: Diagnosis not present

## 2016-11-20 MED ORDER — ALPRAZOLAM 0.5 MG PO TABS: 0.5000 mg | ORAL_TABLET | Freq: Two times a day (BID) | ORAL | 2 refills | Status: DC | PRN

## 2016-11-20 NOTE — Assessment & Plan Note (Signed)
On Eliquis Treat goiter

## 2016-11-20 NOTE — [facility] Notes (Signed)
Subjective:  Patient ID: Mia Contreras, female    DOB: 14-Sep-1928  Age: 83 y.o. MRN: [id number]  CC: Insomnia   HPI Mia Contreras presents for anxiety f/u F/u wt loss, thyrotoxicosis f/u  Outpatient Medications Prior to Visit  Medication Sig Dispense Refill  . apixaban (ELIQUIS) 2.5 MG TABS tablet Take 2.5 mg by mouth 2 (two) times daily.    . Calcium Carbonate-Vitamin D (CALCIUM 600+D) 600-400 MG-UNIT per tablet Take 1 tablet by mouth daily.    . Fluticasone-Salmeterol (ADVAIR DISKUS) 250-50 MCG/DOSE AEPB Inhale 1 puff into the lungs 2 (two) times daily. 60 each 3  . methimazole (TAPAZOLE) 10 MG tablet Take 2 tablets (20 mg total) by mouth 2 (two) times daily. 120 tablet 2  . torsemide (DEMADEX) 20 MG tablet Take 2 tablets (40 mg total) by mouth daily. 60 tablet 6  . vitamin C (ASCORBIC ACID) 500 MG tablet Take 1 tablet (500 mg total) by mouth daily. 100 tablet 3  . ALPRAZolam (XANAX) 0.5 MG tablet Take 1 tablet (0.5 mg total) by mouth 2 (two) times daily as needed for anxiety. 20 tablet 2  . ALPRAZolam (XANAX) 0.5 MG tablet Take 1-2 tablets (0.5-1 mg total) by mouth at bedtime as needed for anxiety. 10 tablet 0   No facility-administered medications prior to visit.     ROS Review of Systems  Constitutional: Positive for fatigue and unexpected weight change. Negative for activity change, appetite change and chills.  HENT: Negative for congestion, mouth sores and sinus pressure.   Eyes: Negative for visual disturbance.  Respiratory: Negative for cough and chest tightness.   Gastrointestinal: Negative for abdominal pain and nausea.  Genitourinary: Negative for difficulty urinating, frequency and vaginal pain.  Musculoskeletal: Negative for back pain and gait problem.  Skin: Negative for pallor and rash.  Neurological: Negative for dizziness, tremors, weakness, numbness and headaches.  Psychiatric/Behavioral: Positive for sleep disturbance. Negative for confusion. The patient  is nervous/anxious.     Objective:  BP (!) 142/88   Pulse 64   Temp 97.6 F (36.4 C)   Ht 5' (1.524 m)   Wt 110 lb (49.9 kg)   SpO2 98%   BMI 21.48 kg/m   BP Readings from Last 3 Encounters:  11/20/16 (!) 142/88  11/17/16 137/78  11/15/16 (!) 144/82    Wt Readings from Last 3 Encounters:  11/20/16 110 lb (49.9 kg)  11/15/16 110 lb (49.9 kg)  10/10/16 116 lb (52.6 kg)    Physical Exam  Constitutional: She appears well-developed. No distress.  HENT:  Head: Normocephalic.  Right Ear: External ear normal.  Left Ear: External ear normal.  Nose: Nose normal.  Mouth/Throat: Oropharynx is clear and moist.  Eyes: Conjunctivae are normal. Pupils are equal, round, and reactive to light. Right eye exhibits no discharge. Left eye exhibits no discharge.  Neck: Normal range of motion. Neck supple. No JVD present. No tracheal deviation present. No thyromegaly present.  Cardiovascular: Normal rate, regular rhythm and normal heart sounds.   Pulmonary/Chest: No stridor. No respiratory distress. She has no wheezes.  Abdominal: Soft. Bowel sounds are normal. She exhibits no distension and no mass. There is no tenderness. There is no rebound and no guarding.  Musculoskeletal: She exhibits no edema or tenderness.  Lymphadenopathy:    She has no cervical adenopathy.  Neurological: She displays normal reflexes. No cranial nerve deficit. She exhibits normal muscle tone. Coordination normal.  Skin: No rash noted. No erythema.  Psychiatric: She has a  normal mood and affect. Her behavior is normal. Judgment and thought content normal.    Lab Results  Component Value Date   WBC 7.9 10/04/2016   HGB 13.0 10/04/2016   HCT 39.5 10/04/2016   PLT 360.0 10/04/2016   GLUCOSE 100 (H) 10/04/2016   CHOL 130 11/04/2015   TRIG 77.0 11/04/2015   HDL 54.50 11/04/2015   LDLCALC 60 11/04/2015   ALT 21 10/04/2016   AST 25 10/04/2016   NA 142 10/04/2016   K 3.8 10/04/2016   CL 99 10/04/2016    CREATININE 1.17 10/04/2016   BUN 17 10/04/2016   CO2 37 (H) 10/04/2016   TSH 0.03 (L) 11/15/2016   INR 0.95 10/25/2011   HGBA1C 5.8 02/06/2014   MICROALBUR 0.6 03/23/2011    No results found.  Assessment & Plan:   There are no diagnoses linked to this encounter. I am having Ms. Orebaugh maintain her Calcium Carbonate-Vitamin D, Fluticasone-Salmeterol, vitamin C, apixaban, torsemide, methimazole, and ALPRAZolam.  Meds ordered this encounter  Medications  . ALPRAZolam (XANAX) 0.5 MG tablet    Sig: Take 1 tablet (0.5 mg total) by mouth 2 (two) times daily as needed for anxiety.    Dispense:  60 tablet    Refill:  2     Follow-up: No Follow-up on file.  Walker Kehr, MD

## 2016-11-20 NOTE — Assessment & Plan Note (Signed)
Resolved

## 2016-11-20 NOTE — Telephone Encounter (Signed)
Patient has been worked in at [facility] with dr plotnikov today, patient on way now

## 2016-11-20 NOTE — Assessment & Plan Note (Signed)
Xanax prn  Potential benefits of a long term benzodiazepines  use as well as potential risks  and complications were explained to the patient and were aknowledged. 

## 2016-11-20 NOTE — Telephone Encounter (Signed)
Pt stated that she was in the ER this weekend . She did not sleep for 3 days.  She has lost 5 pounds in 5 days.  Pharmacy did not get the xanax . She is wants to be worked in today and the Art gallery manager number -[phone]

## 2016-11-27 DIAGNOSIS — Z85828 Personal history of other malignant neoplasm of skin: Secondary | ICD-10-CM | POA: Diagnosis not present

## 2016-11-27 DIAGNOSIS — D485 Neoplasm of uncertain behavior of skin: Secondary | ICD-10-CM | POA: Diagnosis not present

## 2016-11-27 DIAGNOSIS — D0461 Carcinoma in situ of skin of right upper limb, including shoulder: Secondary | ICD-10-CM | POA: Diagnosis not present

## 2016-11-27 DIAGNOSIS — C44319 Basal cell carcinoma of skin of other parts of face: Secondary | ICD-10-CM | POA: Diagnosis not present

## 2016-11-27 DIAGNOSIS — L570 Actinic keratosis: Secondary | ICD-10-CM | POA: Diagnosis not present

## 2016-12-08 ENCOUNTER — Other Ambulatory Visit (HOSPITAL_COMMUNITY): Payer: Self-pay | Admitting: Pharmacist

## 2016-12-08 ENCOUNTER — Telehealth (HOSPITAL_COMMUNITY): Payer: Self-pay | Admitting: Pharmacist

## 2016-12-08 MED ORDER — APIXABAN 2.5 MG PO TABS: 2.5000 mg | ORAL_TABLET | Freq: Two times a day (BID) | ORAL | 11 refills | Status: DC

## 2016-12-08 NOTE — Telephone Encounter (Signed)
Ms. Somera called wanting to know if her Eliquis will still be $45/mo this year. She has [facility] Part D which does not require a PA any longer for Eliquis. I verified with the pharmacy that it is still going through for $45/mo. I have also advised Ms. Bostwick that if she goes into the "donut hole" this year and her copay is too costly for her, she should call me back and we can work through [employer] patient assistance. She verbalized understanding and was grateful for the advice.   [facility] info: ID: [id number] BIN: [id number] GRP: Pike Velva Harman, PharmD, BCPS, CPP Clinical Pharmacist Pager: [phone] Phone: [phone] 12/08/2016 4:10 PM

## 2016-12-12 ENCOUNTER — Other Ambulatory Visit: Payer: Self-pay

## 2016-12-12 MED ORDER — METHIMAZOLE 10 MG PO TABS: 20.0000 mg | ORAL_TABLET | Freq: Two times a day (BID) | ORAL | 1 refills | Status: DC

## 2016-12-13 ENCOUNTER — Telehealth: Payer: Self-pay | Admitting: Cardiovascular Disease

## 2016-12-13 NOTE — Telephone Encounter (Signed)
Mrs.Tsutsui is returning a call . Please call Thanks

## 2016-12-13 NOTE — Telephone Encounter (Signed)
Follow Up:   Pt calling again to see who called her.

## 2016-12-14 NOTE — Telephone Encounter (Signed)
Returned patient's call. Informed her that there is no note on someone calling her from our office. Informed patient that she was not called by Dr. Johnsie Cancel or his nurse. Patient verbalized understanding.

## 2016-12-27 NOTE — Progress Notes (Signed)
Patient ID: Mia Contreras, female   DOB: 1928-10-26, 83 y.o.   MRN: [id number]      Referring Physician: Dr Johnsie Cancel Primary Care: Arlana Lindau, MD Primary Cardiologist: Dr Johnsie Cancel Primary HF: Dr. Haroldine Laws  HPI: Mia Contreras is a 83 y.o. female with a history of nonischemic cardiomyopathy, systolic CHF EF 99-24% in 2016 (echo 45-50% in 11/2015), PACs, L Breast cancer in 2000 with surgery and chemo.  She has never had a cath. Had 4 rounds of chemo for breast CA. Doesn't remember which agents she got. Does not recall a red medicine.   Admitted 4/25 -12/19/15 with A/C combined CHF and AKI. Creatinine improved.  She diuresed 12 L and down 19 lbs from highest weight that admission. Discharge weight 134 lbs.   Admitted 6/7 -> 02/07/16 with volume overload. Diuresed 10 lbs on IV lasix and transitioned to torsemide for home. Completed course of levaquin for left basilar infiltrates on CXR.  Was discharged to [facility] due to frequent falls at home. Discharge weight 125 lbs.   Was admitted  (10/17) with severe symptomatic hyperkalemia (K > 7.5) and ARF (Creatinine 5.3). Was initially bradycardic with HRs in 20s. Spiro and potassium stopped. Treated with fluids and bicarb and recovered. On d/c 10/22 K 4.4 Cr 2.2 Discussed Palliative Care dicsussed but not pursued. B-blocker stopped.    Went to  [facility] on d/c . Dagnosed with LUE clot at site of IV and Eliquis started. Weight stable. Has restarted PT and now doing exercise bike. Home now    11/2015 ECHO EF 45-50%  12/16/14  ECHO Normal LV size with EF 30-35%, diffuse hypokinesis. Normal RVsize with mildly decreased systolic function. Moderate to severeLAE. Mild MR. Moderate pulmonary hypertension.   FH: Brother also has CAD and afib, Mother had CABG but did well, Father passed away from MI at age 22 thought to be 2/2 to heavy ETOH use.  SH:  Pt has a distant history of smoking. Pt has 50-60 pack years up to 2ppd. Quit in 1982.   Past  Medical History:  Diagnosis Date  . AKI (acute kidney injury) ([place]) 01/2016  . ANEMIA-NOS 08/20/2007  . ANXIETY 03/16/2007  . BREAST CANCER, HX OF 03/16/2007  . CARPAL TUNNEL SYNDROME, RIGHT 01/11/2008  . Cataract    floaters  . CELLULITIS, LEG, RIGHT 08/20/2007  . Collagenous colitis   . COPD (chronic obstructive pulmonary disease) ([place])   . DIVERTICULOSIS, COLON 08/20/2007  . GOITER, MULTINODULAR 05/06/2010   Dr [name]  . HYPERLIPIDEMIA 08/20/2007  . HYPERTENSION 03/16/2007  . HYPERTHYROIDISM 05/23/2010  . IBS (irritable bowel syndrome)   . OSTEOARTHRITIS 03/16/2007  . OSTEOPOROSIS 08/20/2007  . Pneumonia 07/2011   took abx for several weeks  . PREMATURE ATRIAL CONTRACTIONS 09/21/2010  . SCOLIOSIS 01/11/2008  . SKIN CANCER, HX OF 11/12/2008    R cheek 2010, L Cheek 2011 Dr. Tonia Brooms  . Thyroid nodule   . VENOUS INSUFFICIENCY 09/05/2007    Current Outpatient Prescriptions  Medication Sig Dispense Refill  . Acetaminophen (TYLENOL ARTHRITIS PAIN PO) Take 1 tablet by mouth daily as needed (pain).    Marland Kitchen ALPRAZolam (XANAX) 0.5 MG tablet Take 1 tablet (0.5 mg total) by mouth 2 (two) times daily as needed for anxiety. 60 tablet 2  . apixaban (ELIQUIS) 2.5 MG TABS tablet Take 1 tablet (2.5 mg total) by mouth 2 (two) times daily. 60 tablet 11  . Calcium Carbonate-Vitamin D (CALCIUM 600+D) 600-400 MG-UNIT per tablet Take 1 tablet by mouth daily.    Marland Kitchen  Fluticasone-Salmeterol (ADVAIR DISKUS) 250-50 MCG/DOSE AEPB Inhale 1 puff into the lungs 2 (two) times daily. 60 each 3  . methimazole (TAPAZOLE) 10 MG tablet Take 2 tablets (20 mg total) by mouth 2 (two) times daily. 360 tablet 1  . torsemide (DEMADEX) 20 MG tablet Take 2 tablets (40 mg total) by mouth daily. 60 tablet 6  . vitamin C (ASCORBIC ACID) 500 MG tablet Take 1 tablet (500 mg total) by mouth daily. 100 tablet 3   No current facility-administered medications for this visit.     Allergies  Allergen Reactions  . Cefuroxime Diarrhea  .  Celecoxib Nausea Only  . Deltasone [Prednisone]     Oral deltasone is causing swelling (can use Depo-Medrol)  . Ranitidine     bloating  . Spironolactone     High K  . Tramadol Hcl Nausea Only      Social History   Social History  . Marital status: Single    Spouse name: N/A  . Number of children: N/A  . Years of education: N/A   Occupational History  . Not on file.   Social History Main Topics  . Smoking status: Former Smoker    Quit date: 12/20/1980  . Smokeless tobacco: Never Used  . Alcohol use Yes     Comment: socially  . Drug use: No  . Sexual activity: Not Currently   Other Topics Concern  . Not on file   Social History Narrative  . No narrative on file      Family History  Problem Relation Age of Onset  . Dementia Mother   . Heart disease Mother   . Mental retardation Mother   . Hypertension Mother   . Alzheimer's disease Mother   . Heart attack Father   . Alcohol abuse Father   . Prostate cancer Brother   . Diabetes Other   . Colon cancer Neg Hx   . Anesthesia problems Neg Hx     Vitals:   01/01/17 1115  BP: 130/64  Pulse: 81  SpO2: 97%  Weight: 116 lb 12.8 oz (53 kg)  Height: 5\' 3"  (1.6 m)   Wt Readings from Last 3 Encounters:  01/01/17 116 lb 12.8 oz (53 kg)  11/20/16 110 lb (49.9 kg)  11/15/16 110 lb (49.9 kg)    PHYSICAL EXAM: BP 130/64   Pulse 81   Ht 5\' 3"  (1.6 m)   Wt 116 lb 12.8 oz (53 kg)   SpO2 97%   BMI 20.69 kg/m  Affect appropriate Healthy:  appears stated age 89: normal Neck supple with no adenopathy JVP normal no bruits no thyromegaly Lungs clear with no wheezing and good diaphragmatic motion Heart:  S1/S2 no murmur, no rub, gallop or click PMI normal Abdomen: benighn, BS positve, no tenderness, no AAA no bruit.  No HSM or HJR Distal pulses intact with no bruits No edema Neuro non-focal Skin warm and dry No muscular weakness   ASSESSMENT & PLAN: 1. Chronic diastolic HF with R>>L symptoms. ECHO  11/2015 EF 45-50%.  NYHA III. 2. AKI on CKD stage III 3. LE wounds- Followed at the wound center.  4.  H/o COPD 5. H/o breast cancer s/p chemo 2000 6. Chronic A fib- Rate controlled. On Eliquis twice a day.  -This patients CHA2DS2-VASc Score is 6. 7. Falls- PT/OT now home  8. LUE DVT  - Back on Eliquis.    Can give extra torsemide if weight going up. Off b-blocker and ACE with hyperkalemia and  low BP.   Code status DNR/DNI per Dr Dorothe Pea Kamylah Manzo,MD 11:32 AM

## 2017-01-01 ENCOUNTER — Encounter: Payer: Self-pay | Admitting: Cardiovascular Disease

## 2017-01-01 ENCOUNTER — Ambulatory Visit (INDEPENDENT_AMBULATORY_CARE_PROVIDER_SITE_OTHER): Payer: Medicare Other | Admitting: Cardiovascular Disease

## 2017-01-01 VITALS — BP 130/64 | HR 81 | Ht 63.0 in | Wt 116.8 lb

## 2017-01-01 DIAGNOSIS — Z79899 Other long term (current) drug therapy: Secondary | ICD-10-CM

## 2017-01-01 DIAGNOSIS — I5032 Chronic diastolic (congestive) heart failure: Secondary | ICD-10-CM | POA: Diagnosis not present

## 2017-01-01 NOTE — Patient Instructions (Signed)

## 2017-01-08 ENCOUNTER — Telehealth: Payer: Self-pay | Admitting: Internal Medicine

## 2017-01-08 ENCOUNTER — Other Ambulatory Visit (INDEPENDENT_AMBULATORY_CARE_PROVIDER_SITE_OTHER): Payer: Medicare Other

## 2017-01-08 ENCOUNTER — Encounter: Payer: Self-pay | Admitting: Internal Medicine

## 2017-01-08 ENCOUNTER — Ambulatory Visit (INDEPENDENT_AMBULATORY_CARE_PROVIDER_SITE_OTHER): Payer: Medicare Other | Admitting: Internal Medicine

## 2017-01-08 VITALS — BP 140/78 | HR 81 | Temp 98.4°F | Ht 63.0 in | Wt 115.0 lb

## 2017-01-08 DIAGNOSIS — I5022 Chronic systolic (congestive) heart failure: Secondary | ICD-10-CM

## 2017-01-08 DIAGNOSIS — I481 Persistent atrial fibrillation: Secondary | ICD-10-CM | POA: Diagnosis not present

## 2017-01-08 DIAGNOSIS — I4819 Other persistent atrial fibrillation: Secondary | ICD-10-CM

## 2017-01-08 DIAGNOSIS — N184 Chronic kidney disease, stage 4 (severe): Secondary | ICD-10-CM

## 2017-01-08 DIAGNOSIS — I10 Essential (primary) hypertension: Secondary | ICD-10-CM | POA: Diagnosis not present

## 2017-01-08 DIAGNOSIS — E0520 Thyrotoxicosis with toxic multinodular goiter without thyrotoxic crisis or storm: Secondary | ICD-10-CM | POA: Diagnosis not present

## 2017-01-08 DIAGNOSIS — M810 Age-related osteoporosis without current pathological fracture: Secondary | ICD-10-CM

## 2017-01-08 LAB — URINALYSIS, ROUTINE W REFLEX MICROSCOPIC
Bilirubin Urine: NEGATIVE
HGB URINE DIPSTICK: NEGATIVE
Ketones, ur: NEGATIVE
Nitrite: NEGATIVE
PH: 6 (ref 5.0–8.0)
RBC / HPF: NONE SEEN (ref 0–?)
TOTAL PROTEIN, URINE-UPE24: NEGATIVE
Urine Glucose: NEGATIVE
Urobilinogen, UA: 0.2 (ref 0.0–1.0)

## 2017-01-08 LAB — BASIC METABOLIC PANEL
BUN: 28 mg/dL — AB (ref 6–23)
CO2: 34 mEq/L — ABNORMAL HIGH (ref 19–32)
CREATININE: 1.27 mg/dL — AB (ref 0.40–1.20)
Calcium: 9.9 mg/dL (ref 8.4–10.5)
Chloride: 98 mEq/L (ref 96–112)
GFR: 42.45 mL/min — AB (ref >60.00)
Glucose, Bld: 95 mg/dL (ref 70–99)
Potassium: 3.5 mEq/L (ref 3.5–5.1)
Sodium: 140 mEq/L (ref 135–145)

## 2017-01-08 MED ORDER — ALPRAZOLAM 0.5 MG PO TABS: 0.5000 mg | ORAL_TABLET | Freq: Two times a day (BID) | ORAL | 2 refills | Status: DC | PRN

## 2017-01-08 MED ORDER — SULFAMETHOXAZOLE-TRIMETHOPRIM 800-160 MG PO TABS: 1.0000 | ORAL_TABLET | Freq: Two times a day (BID) | ORAL | 0 refills | Status: DC

## 2017-01-08 NOTE — Progress Notes (Signed)
Subjective:  Patient ID: Mia Contreras, female    DOB: 1929/01/27  Age: 83 y.o. MRN: [id number]  CC: No chief complaint on file.   HPI Mia Contreras Heeg presents for anxiety, bone loss, CHF f/u  Outpatient Medications Prior to Visit  Medication Sig Dispense Refill  . Acetaminophen (TYLENOL ARTHRITIS PAIN PO) Take 1 tablet by mouth daily as needed (pain).    Marland Kitchen ALPRAZolam (XANAX) 0.5 MG tablet Take 1 tablet (0.5 mg total) by mouth 2 (two) times daily as needed for anxiety. 60 tablet 2  . apixaban (ELIQUIS) 2.5 MG TABS tablet Take 1 tablet (2.5 mg total) by mouth 2 (two) times daily. 60 tablet 11  . Calcium Carbonate-Vitamin D (CALCIUM 600+D) 600-400 MG-UNIT per tablet Take 1 tablet by mouth daily.    . Fluticasone-Salmeterol (ADVAIR DISKUS) 250-50 MCG/DOSE AEPB Inhale 1 puff into the lungs 2 (two) times daily. 60 each 3  . methimazole (TAPAZOLE) 10 MG tablet Take 2 tablets (20 mg total) by mouth 2 (two) times daily. 360 tablet 1  . torsemide (DEMADEX) 20 MG tablet Take 2 tablets (40 mg total) by mouth daily. 60 tablet 6  . vitamin C (ASCORBIC ACID) 500 MG tablet Take 1 tablet (500 mg total) by mouth daily. 100 tablet 3   No facility-administered medications prior to visit.     ROS Review of Systems  Constitutional: Negative for activity change, appetite change, chills, fatigue and unexpected weight change.  HENT: Negative for congestion, mouth sores, rhinorrhea and sinus pressure.   Eyes: Negative for visual disturbance.  Respiratory: Negative for cough and chest tightness.   Gastrointestinal: Negative for abdominal pain and nausea.  Genitourinary: Negative for difficulty urinating, frequency and vaginal pain.  Musculoskeletal: Positive for arthralgias, back pain and myalgias. Negative for gait problem.  Skin: Negative for pallor and rash.  Neurological: Negative for dizziness, tremors, weakness, numbness and headaches.  Psychiatric/Behavioral: Negative for confusion and sleep  disturbance.    Objective:  BP 140/78 (BP Location: Right Arm, Patient Position: Sitting, Cuff Size: Normal)   Pulse 81   Temp 98.4 F (36.9 C) (Oral)   Ht 5\' 3"  (1.6 m)   Wt 115 lb 0.6 oz (52.2 kg)   SpO2 96%   BMI 20.38 kg/m   BP Readings from Last 3 Encounters:  01/08/17 140/78  01/01/17 130/64  11/20/16 (!) 142/88    Wt Readings from Last 3 Encounters:  01/08/17 115 lb 0.6 oz (52.2 kg)  01/01/17 116 lb 12.8 oz (53 kg)  11/20/16 110 lb (49.9 kg)    Physical Exam  Constitutional: She appears well-developed. No distress.  HENT:  Head: Normocephalic.  Right Ear: External ear normal.  Left Ear: External ear normal.  Nose: Nose normal.  Mouth/Throat: Oropharynx is clear and moist.  Eyes: Conjunctivae are normal. Pupils are equal, round, and reactive to light. Right eye exhibits no discharge. Left eye exhibits no discharge.  Neck: Normal range of motion. Neck supple. No JVD present. No tracheal deviation present. No thyromegaly present.  Cardiovascular: Normal heart sounds.   Pulmonary/Chest: No stridor. No respiratory distress. She has no wheezes.  Abdominal: Soft. Bowel sounds are normal. She exhibits no distension and no mass. There is no tenderness. There is no rebound and no guarding.  Musculoskeletal: She exhibits tenderness. She exhibits no edema.  Lymphadenopathy:    She has no cervical adenopathy.  Neurological: She displays normal reflexes. No cranial nerve deficit. She exhibits normal muscle tone. Coordination normal.  Skin: No rash  noted. No erythema.  Psychiatric: She has a normal mood and affect. Her behavior is normal. Judgment and thought content normal.  ataxic irreg RR  Lab Results  Component Value Date   WBC 7.9 10/04/2016   HGB 13.0 10/04/2016   HCT 39.5 10/04/2016   PLT 360.0 10/04/2016   GLUCOSE 100 (H) 10/04/2016   CHOL 130 11/04/2015   TRIG 77.0 11/04/2015   HDL 54.50 11/04/2015   LDLCALC 60 11/04/2015   ALT 21 10/04/2016   AST 25  10/04/2016   NA 142 10/04/2016   K 3.8 10/04/2016   CL 99 10/04/2016   CREATININE 1.17 10/04/2016   BUN 17 10/04/2016   CO2 37 (H) 10/04/2016   TSH 0.03 (L) 11/15/2016   INR 0.95 10/25/2011   HGBA1C 5.8 02/06/2014   MICROALBUR 0.6 03/23/2011    No results found.  Assessment & Plan:   There are no diagnoses linked to this encounter. I am having Ms. Stiver maintain her Calcium Carbonate-Vitamin D, Fluticasone-Salmeterol, vitamin C, torsemide, ALPRAZolam, apixaban, methimazole, and Acetaminophen (TYLENOL ARTHRITIS PAIN PO).  No orders of the defined types were placed in this encounter.    Follow-up: No Follow-up on file.  Walker Kehr, MD

## 2017-01-08 NOTE — Patient Instructions (Signed)
MC well w/Jill 

## 2017-01-08 NOTE — Telephone Encounter (Signed)
Order entered

## 2017-01-08 NOTE — Assessment & Plan Note (Signed)
Labs

## 2017-01-08 NOTE — Assessment & Plan Note (Signed)
Eliquis, Torsemide Off O2

## 2017-01-08 NOTE — Telephone Encounter (Signed)
Pt states she discussed with Dr. Camila Li about a bone density test, could orders be put in for that.

## 2017-01-08 NOTE — Assessment & Plan Note (Signed)
Dr [name] f/u On Tapazole

## 2017-01-08 NOTE — Assessment & Plan Note (Signed)
Demadex  

## 2017-01-08 NOTE — Assessment & Plan Note (Signed)
On Eliquis O2 Acequia - off now Vit C for bruising

## 2017-01-09 DIAGNOSIS — L570 Actinic keratosis: Secondary | ICD-10-CM | POA: Diagnosis not present

## 2017-01-09 DIAGNOSIS — L820 Inflamed seborrheic keratosis: Secondary | ICD-10-CM | POA: Diagnosis not present

## 2017-01-09 DIAGNOSIS — D045 Carcinoma in situ of skin of trunk: Secondary | ICD-10-CM | POA: Diagnosis not present

## 2017-01-11 DIAGNOSIS — C44319 Basal cell carcinoma of skin of other parts of face: Secondary | ICD-10-CM | POA: Diagnosis not present

## 2017-01-18 ENCOUNTER — Ambulatory Visit (INDEPENDENT_AMBULATORY_CARE_PROVIDER_SITE_OTHER): Payer: Medicare Other | Admitting: Endocrinology

## 2017-01-18 ENCOUNTER — Encounter: Payer: Self-pay | Admitting: Endocrinology

## 2017-01-18 VITALS — BP 136/80 | HR 80 | Ht 63.0 in | Wt 116.0 lb

## 2017-01-18 DIAGNOSIS — E0520 Thyrotoxicosis with toxic multinodular goiter without thyrotoxic crisis or storm: Secondary | ICD-10-CM

## 2017-01-18 LAB — TSH: TSH: 0.69 u[IU]/mL (ref 0.35–4.50)

## 2017-01-18 LAB — T4, FREE: Free T4: 0.67 ng/dL (ref 0.60–1.60)

## 2017-01-18 NOTE — Patient Instructions (Addendum)
Thyroid blood tests are requested for you today.  We'll let you know about the results. If ever you have fever while taking methimazole, stop it and call us, even if the reason is obvious, because of the risk of a rare side-effect.  Please come back for a follow-up appointment in 3 months.

## 2017-01-18 NOTE — Progress Notes (Signed)
Subjective:    Patient ID: Mia Contreras, female    DOB: 1929-07-25, 83 y.o.   MRN: [id number]  HPI Pt returns for f/u of hyperthyroidism (due to large multinodular goiter; dx'ed 2008; bx then showed HYPERPLASTICNODULE. in 2011, pt had RAI for hyperthyroidism; she became euthyroid, last [name] in early 2015 was unchanged; in 2017, hyperthyroidism recurred; plan was for repeat RAI, but RAI uptake was low; pt says she does not take any iodine-containing products, so she was rx'ed tapazole).  She still has insomnia and anxiety.  Past Medical History:  Diagnosis Date  . AKI (acute kidney injury) ([place]) 01/2016  . ANEMIA-NOS 08/20/2007  . ANXIETY 03/16/2007  . BREAST CANCER, HX OF 03/16/2007  . CARPAL TUNNEL SYNDROME, RIGHT 01/11/2008  . Cataract    floaters  . CELLULITIS, LEG, RIGHT 08/20/2007  . Collagenous colitis   . COPD (chronic obstructive pulmonary disease) ([place])   . DIVERTICULOSIS, COLON 08/20/2007  . GOITER, MULTINODULAR 05/06/2010   Dr [name]  . HYPERLIPIDEMIA 08/20/2007  . HYPERTENSION 03/16/2007  . HYPERTHYROIDISM 05/23/2010  . IBS (irritable bowel syndrome)   . OSTEOARTHRITIS 03/16/2007  . OSTEOPOROSIS 08/20/2007  . Pneumonia 07/2011   took abx for several weeks  . PREMATURE ATRIAL CONTRACTIONS 09/21/2010  . SCOLIOSIS 01/11/2008  . SKIN CANCER, HX OF 11/12/2008    R cheek 2010, L Cheek 2011 Dr. Tonia Brooms  . Thyroid nodule   . VENOUS INSUFFICIENCY 09/05/2007    Past Surgical History:  Procedure Laterality Date  . ABDOMINAL HYSTERECTOMY    . APPENDECTOMY  1972  . BREAST LUMPECTOMY Left 2001  . CARPAL TUNNEL RELEASE    . CATARACT EXTRACTION    . EYE SURGERY     cataract ext  . HERNIA REPAIR    . MASTECTOMY, PARTIAL Left   . SKIN CANCER EXCISION     face-Dr. Bing Plume  . TOTAL KNEE ARTHROPLASTY    . TOTAL KNEE ARTHROPLASTY Right 2010   Dr Para March  . TOTAL KNEE ARTHROPLASTY  10/30/2011   Procedure: TOTAL KNEE ARTHROPLASTY;  Surgeon: Lorn Junes, MD;  Location: [facility];   Service: Orthopedics;  Laterality: Left;     . XRT     chemo, surgery    Social History   Social History  . Marital status: Single    Spouse name: N/A  . Number of children: N/A  . Years of education: N/A   Occupational History  . Not on file.   Social History Main Topics  . Smoking status: Former Smoker    Quit date: 12/20/1980  . Smokeless tobacco: Never Used  . Alcohol use Yes     Comment: socially  . Drug use: No  . Sexual activity: Not Currently   Other Topics Concern  . Not on file   Social History Narrative  . No narrative on file    Current Outpatient Prescriptions on File Prior to Visit  Medication Sig Dispense Refill  . Acetaminophen (TYLENOL ARTHRITIS PAIN PO) Take 1 tablet by mouth daily as needed (pain).    Marland Kitchen ALPRAZolam (XANAX) 0.5 MG tablet Take 1 tablet (0.5 mg total) by mouth 2 (two) times daily as needed for anxiety. 60 tablet 2  . apixaban (ELIQUIS) 2.5 MG TABS tablet Take 1 tablet (2.5 mg total) by mouth 2 (two) times daily. 60 tablet 11  . Calcium Carbonate-Vitamin D (CALCIUM 600+D) 600-400 MG-UNIT per tablet Take 1 tablet by mouth daily.    . Fluticasone-Salmeterol (ADVAIR DISKUS) 250-50 MCG/DOSE AEPB Inhale 1  puff into the lungs 2 (two) times daily. 60 each 3  . methimazole (TAPAZOLE) 10 MG tablet Take 2 tablets (20 mg total) by mouth 2 (two) times daily. 360 tablet 1  . torsemide (DEMADEX) 20 MG tablet Take 2 tablets (40 mg total) by mouth daily. 60 tablet 6  . vitamin C (ASCORBIC ACID) 500 MG tablet Take 1 tablet (500 mg total) by mouth daily. 100 tablet 3  . sulfamethoxazole-trimethoprim (BACTRIM DS,SEPTRA DS) 800-160 MG tablet Take 1 tablet by mouth 2 (two) times daily. (Patient not taking: Reported on 01/18/2017) 10 tablet 0   No current facility-administered medications on file prior to visit.     Allergies  Allergen Reactions  . Cefuroxime Diarrhea  . Celecoxib Nausea Only  . Deltasone [Prednisone]     Oral deltasone is causing  swelling (can use Depo-Medrol)  . Ranitidine     bloating  . Spironolactone     High K  . Tramadol Hcl Nausea Only    Family History  Problem Relation Age of Onset  . Dementia Mother   . Heart disease Mother   . Mental retardation Mother   . Hypertension Mother   . Alzheimer's disease Mother   . Heart attack Father   . Alcohol abuse Father   . Prostate cancer Brother   . Diabetes Other   . Colon cancer Neg Hx   . Anesthesia problems Neg Hx     BP 136/80   Pulse 80   Ht 5\' 3"  (1.6 m)   Wt 116 lb (52.6 kg)   SpO2 91%   BMI 20.55 kg/m    Review of Systems Denies fever.    Objective:   Physical Exam VITAL SIGNS:  See vs page GENERAL: no distress Neck: large multinodular goiter (L>R) is again noted.  Neuro: coarse tremor of the hands.   Skin: not diaphoretic.    Lab Results  Component Value Date   TSH 0.69 01/18/2017      Assessment & Plan:  Hyperthyroidism: well-controlled.  Please continue the same medication. Multinodular goiter: clinically stable.  we'll follow.    Patient Instructions  Thyroid blood tests are requested for you today.  We'll let you know about the results. If ever you have fever while taking methimazole, stop it and call us, even if the reason is obvious, because of the risk of a rare side-effect.  Please come back for a follow-up appointment in 3 months.

## 2017-01-19 DIAGNOSIS — B999 Unspecified infectious disease: Secondary | ICD-10-CM | POA: Diagnosis not present

## 2017-01-22 DIAGNOSIS — Z96653 Presence of artificial knee joint, bilateral: Secondary | ICD-10-CM | POA: Diagnosis not present

## 2017-01-24 ENCOUNTER — Ambulatory Visit (INDEPENDENT_AMBULATORY_CARE_PROVIDER_SITE_OTHER)
Admission: RE | Admit: 2017-01-24 | Discharge: 2017-01-24 | Disposition: A | Discharge status: Home / Self Care | Payer: Medicare Other | Source: Ambulatory Visit | Attending: Internal Medicine | Admitting: Internal Medicine

## 2017-01-24 DIAGNOSIS — M810 Age-related osteoporosis without current pathological fracture: Secondary | ICD-10-CM | POA: Diagnosis not present

## 2017-02-09 DIAGNOSIS — I129 Hypertensive chronic kidney disease with stage 1 through stage 4 chronic kidney disease, or unspecified chronic kidney disease: Secondary | ICD-10-CM | POA: Diagnosis not present

## 2017-02-09 DIAGNOSIS — N184 Chronic kidney disease, stage 4 (severe): Secondary | ICD-10-CM | POA: Diagnosis not present

## 2017-02-09 DIAGNOSIS — D631 Anemia in chronic kidney disease: Secondary | ICD-10-CM | POA: Diagnosis not present

## 2017-02-09 DIAGNOSIS — N2581 Secondary hyperparathyroidism of renal origin: Secondary | ICD-10-CM | POA: Diagnosis not present

## 2017-02-13 DIAGNOSIS — L820 Inflamed seborrheic keratosis: Secondary | ICD-10-CM | POA: Diagnosis not present

## 2017-02-13 DIAGNOSIS — C44319 Basal cell carcinoma of skin of other parts of face: Secondary | ICD-10-CM | POA: Diagnosis not present

## 2017-02-13 DIAGNOSIS — S71152A Open bite, left thigh, initial encounter: Secondary | ICD-10-CM | POA: Diagnosis not present

## 2017-02-13 DIAGNOSIS — L570 Actinic keratosis: Secondary | ICD-10-CM | POA: Diagnosis not present

## 2017-03-06 ENCOUNTER — Telehealth: Payer: Self-pay

## 2017-03-06 ENCOUNTER — Encounter: Payer: Self-pay | Admitting: Internal Medicine

## 2017-03-06 ENCOUNTER — Other Ambulatory Visit (HOSPITAL_COMMUNITY)
Admission: RE | Admit: 2017-03-06 | Discharge: 2017-03-06 | Disposition: A | Discharge status: Home / Self Care | Payer: Medicare Other | Source: Ambulatory Visit | Attending: Internal Medicine | Admitting: Internal Medicine

## 2017-03-06 ENCOUNTER — Other Ambulatory Visit: Payer: Medicare Other

## 2017-03-06 ENCOUNTER — Other Ambulatory Visit (INDEPENDENT_AMBULATORY_CARE_PROVIDER_SITE_OTHER): Payer: Medicare Other

## 2017-03-06 ENCOUNTER — Ambulatory Visit (INDEPENDENT_AMBULATORY_CARE_PROVIDER_SITE_OTHER): Payer: Medicare Other | Admitting: Internal Medicine

## 2017-03-06 DIAGNOSIS — N760 Acute vaginitis: Secondary | ICD-10-CM | POA: Insufficient documentation

## 2017-03-06 DIAGNOSIS — N952 Postmenopausal atrophic vaginitis: Secondary | ICD-10-CM | POA: Diagnosis not present

## 2017-03-06 DIAGNOSIS — M810 Age-related osteoporosis without current pathological fracture: Secondary | ICD-10-CM

## 2017-03-06 DIAGNOSIS — N184 Chronic kidney disease, stage 4 (severe): Secondary | ICD-10-CM

## 2017-03-06 LAB — URINALYSIS, ROUTINE W REFLEX MICROSCOPIC
BILIRUBIN URINE: NEGATIVE
HGB URINE DIPSTICK: NEGATIVE
KETONES UR: NEGATIVE
NITRITE: NEGATIVE
RBC / HPF: NONE SEEN (ref 0–?)
Specific Gravity, Urine: 1.01 (ref 1.000–1.030)
TOTAL PROTEIN, URINE-UPE24: NEGATIVE
URINE GLUCOSE: NEGATIVE
UROBILINOGEN UA: 0.2 (ref 0.0–1.0)
pH: 7 (ref 5.0–8.0)

## 2017-03-06 LAB — BASIC METABOLIC PANEL
BUN: 26 mg/dL — ABNORMAL HIGH (ref 6–23)
CALCIUM: 10.2 mg/dL (ref 8.4–10.5)
CO2: 33 mEq/L — ABNORMAL HIGH (ref 19–32)
Chloride: 98 mEq/L (ref 96–112)
Creatinine, Ser: 1.28 mg/dL — ABNORMAL HIGH (ref 0.40–1.20)
GFR: 42.05 mL/min — AB (ref >60.00)
Glucose, Bld: 99 mg/dL (ref 70–99)
POTASSIUM: 3.5 meq/L (ref 3.5–5.1)
SODIUM: 141 meq/L (ref 135–145)

## 2017-03-06 MED ORDER — ALPRAZOLAM 0.5 MG PO TABS: 0.5000 mg | ORAL_TABLET | Freq: Two times a day (BID) | ORAL | 2 refills | Status: DC | PRN

## 2017-03-06 MED ORDER — TORSEMIDE 20 MG PO TABS: 40.0000 mg | ORAL_TABLET | Freq: Every day | ORAL | 6 refills | Status: DC

## 2017-03-06 MED ORDER — TRIAMCINOLONE ACETONIDE 0.1 % EX OINT: 1.0000 "application " | TOPICAL_OINTMENT | Freq: Two times a day (BID) | CUTANEOUS | 1 refills | Status: DC

## 2017-03-06 MED ORDER — ESTRADIOL 0.1 MG/GM VA CREA: 1.0000 | TOPICAL_CREAM | VAGINAL | 12 refills | Status: DC

## 2017-03-06 NOTE — Addendum Note (Signed)
Addended by: Karren Cobble on: 03/06/2017 02:18 PM   Modules accepted: Orders

## 2017-03-06 NOTE — Patient Instructions (Addendum)
Use wet wipes     Atrophic Vaginitis Atrophic vaginitis is a condition in which the tissues that line the vagina become dry and thin. This condition is most common in women who have stopped having regular menstrual periods (menopause). This usually starts when a woman is 64-81 years old. Estrogen helps to keep the vagina moist. It stimulates the vagina to produce a clear fluid that lubricates the vagina for sexual intercourse. This fluid also protects the vagina from infection. Lack of estrogen can cause the lining of the vagina to get thinner and dryer. The vagina may also shrink in size. It may become less elastic. Atrophic vaginitis tends to get worse over time as a woman's estrogen level drops. What are the causes? This condition is caused by the normal drop in estrogen that happens around the time of menopause. What increases the risk? Certain conditions or situations may lower a woman's estrogen level, which increases her risk of atrophic vaginitis. These include:  Taking medicine that blocks estrogen.  Having ovaries removed surgically.  Being treated for cancer with X-ray treatment (radiation) or medicines (chemotherapy).  Exercising very hard and often.  Having an eating disorder (anorexia).  Giving birth or breastfeeding.  Being over the age of 65.  Smoking.  What are the signs or symptoms? Symptoms of this condition include:  Pain, soreness, or bleeding during sexual intercourse (dyspareunia).  Vaginal burning, irritation, or itching.  Pain or bleeding during a vaginal examination using a speculum (pelvic exam).  Loss of interest in sexual activity.  Having burning pain when passing urine.  Vaginal discharge that is brown or yellow.  In some cases, there are no symptoms. How is this diagnosed? This condition is diagnosed with a medical history and physical exam. This will include a pelvic exam that checks whether the inside of your vagina appears pale, thin, or  dry. Rarely, you may also have other tests, including:  A urine test.  A test that checks the acid balance in your vaginal fluid (acid balance test).  How is this treated? Treatment for this condition may depend on the severity of your symptoms. Treatment may include:  Using an over-the-counter vaginal lubricant before you have sexual intercourse.  Using a long-acting vaginal moisturizer.  Using low-dose vaginal estrogen for moderate to severe symptoms that do not respond to other treatments. Options include creams, tablets, and inserts (vaginal rings). Before using vaginal estrogen, tell your health care provider if you have a history of: ? Breast cancer. ? Endometrial cancer. ? Blood clots.  Taking medicines. You may be able to take a daily pill for dyspareunia. Discuss all of the risks of this medicine with your health care provider. It is usually not recommended for women who have a family history or personal history of breast cancer.  If your symptoms are very mild and you are not sexually active, you may not need treatment. Follow these instructions at home:  Take medicines only as directed by your health care provider. Do not use herbal or alternative medicines unless your health care provider says that you can.  Use over-the-counter creams, lubricants, or moisturizers for dryness only as directed by your health care provider.  If your atrophic vaginitis is caused by menopause, discuss all of your menopausal symptoms and treatment options with your health care provider.  Do not douche.  Do not use products that can make your vagina dry. These include: ? Scented feminine sprays. ? Scented tampons. ? Scented soaps.  If it hurts  to have sex, talk with your sexual partner. Contact a health care provider if:  Your discharge looks different than normal.  Your vagina has an unusual smell.  You have new symptoms.  Your symptoms do not improve with treatment.  Your  symptoms get worse. This information is not intended to replace advice given to you by your health care provider. Make sure you discuss any questions you have with your health care provider. Document Released: 12/22/2014 Document Revised: 01/13/2016 Document Reviewed: 07/29/2014 Elsevier Interactive Patient Education  [employer].

## 2017-03-06 NOTE — Progress Notes (Signed)
Subjective:  Patient ID: [name], female    DOB: 1929-02-24  Age: 83 y.o. MRN: [id number]  CC: No chief complaint on file.   HPI Mia Contreras presents for itching between the legs x long time C/o LBP F/u BDS test result  Outpatient Medications Prior to Visit  Medication Sig Dispense Refill  . Acetaminophen (TYLENOL ARTHRITIS PAIN PO) Take 1 tablet by mouth daily as needed (pain).    Marland Kitchen ALPRAZolam (XANAX) 0.5 MG tablet Take 1 tablet (0.5 mg total) by mouth 2 (two) times daily as needed for anxiety. 60 tablet 2  . apixaban (ELIQUIS) 2.5 MG TABS tablet Take 1 tablet (2.5 mg total) by mouth 2 (two) times daily. 60 tablet 11  . Calcium Carbonate-Vitamin D (CALCIUM 600+D) 600-400 MG-UNIT per tablet Take 1 tablet by mouth daily.    . Fluticasone-Salmeterol (ADVAIR DISKUS) 250-50 MCG/DOSE AEPB Inhale 1 puff into the lungs 2 (two) times daily. 60 each 3  . methimazole (TAPAZOLE) 10 MG tablet Take 2 tablets (20 mg total) by mouth 2 (two) times daily. 360 tablet 1  . torsemide (DEMADEX) 20 MG tablet Take 2 tablets (40 mg total) by mouth daily. 60 tablet 6  . vitamin C (ASCORBIC ACID) 500 MG tablet Take 1 tablet (500 mg total) by mouth daily. 100 tablet 3  . sulfamethoxazole-trimethoprim (BACTRIM DS,SEPTRA DS) 800-160 MG tablet Take 1 tablet by mouth 2 (two) times daily. (Patient not taking: Reported on 03/06/2017) 10 tablet 0   No facility-administered medications prior to visit.     ROS Review of Systems  Constitutional: Negative for activity change, appetite change, chills, fatigue and unexpected weight change.  HENT: Negative for congestion, mouth sores and sinus pressure.   Eyes: Negative for visual disturbance.  Respiratory: Negative for cough and chest tightness.   Gastrointestinal: Negative for abdominal pain and nausea.  Genitourinary: Positive for genital sores and urgency. Negative for difficulty urinating, frequency and vaginal pain.  Musculoskeletal: Positive for back  pain and gait problem.  Skin: Negative for pallor and rash.  Neurological: Negative for dizziness, tremors, weakness, numbness and headaches.  Psychiatric/Behavioral: Negative for confusion and sleep disturbance.    Objective:  BP 134/80 (BP Location: Left Arm, Patient Position: Sitting, Cuff Size: Normal)   Pulse 76   Temp 98.5 F (36.9 C) (Oral)   Ht 5\' 3"  (1.6 m)   Wt 118 lb (53.5 kg)   SpO2 97%   BMI 20.90 kg/m   BP Readings from Last 3 Encounters:  03/06/17 134/80  01/18/17 136/80  01/08/17 140/78    Wt Readings from Last 3 Encounters:  03/06/17 118 lb (53.5 kg)  01/18/17 116 lb (52.6 kg)  01/08/17 115 lb 0.6 oz (52.2 kg)    Physical Exam  Constitutional: She appears well-developed. No distress.  HENT:  Head: Normocephalic.  Right Ear: External ear normal.  Left Ear: External ear normal.  Nose: Nose normal.  Mouth/Throat: Oropharynx is clear and moist.  Eyes: Pupils are equal, round, and reactive to light. Conjunctivae are normal. Right eye exhibits no discharge. Left eye exhibits no discharge.  Neck: Normal range of motion. Neck supple. No JVD present. No tracheal deviation present. No thyromegaly present.  Cardiovascular: Normal rate, regular rhythm and normal heart sounds.   Pulmonary/Chest: No stridor. No respiratory distress. She has no wheezes.  Abdominal: Soft. Bowel sounds are normal. She exhibits no distension and no mass. There is no tenderness. There is no rebound and no guarding.  Musculoskeletal: She exhibits  no edema or tenderness.  Lymphadenopathy:    She has no cervical adenopathy.  Neurological: She displays normal reflexes. No cranial nerve deficit. She exhibits normal muscle tone. Coordination normal.  Skin: No rash noted. No erythema.  Psychiatric: She has a normal mood and affect. Her behavior is normal. Judgment and thought content normal.  atrophic vagina Tender introitus, speculum exam was aborted KOH obtained Rectal area w/slight  erythema  Lab Results  Component Value Date   WBC 7.9 10/04/2016   HGB 13.0 10/04/2016   HCT 39.5 10/04/2016   PLT 360.0 10/04/2016   GLUCOSE 95 01/08/2017   CHOL 130 11/04/2015   TRIG 77.0 11/04/2015   HDL 54.50 11/04/2015   LDLCALC 60 11/04/2015   ALT 21 10/04/2016   AST 25 10/04/2016   NA 140 01/08/2017   K 3.5 01/08/2017   CL 98 01/08/2017   CREATININE 1.27 (H) 01/08/2017   BUN 28 (H) 01/08/2017   CO2 34 (H) 01/08/2017   TSH 0.69 01/18/2017   INR 0.95 10/25/2011   HGBA1C 5.8 02/06/2014   MICROALBUR 0.6 03/23/2011    Dg Bone Density  Result Date: 01/28/2017 Date of study: 01/24/2017 Exam: DUAL X-RAY ABSORPTIOMETRY (DXA) FOR BONE MINERAL DENSITY (BMD) Instrument: [employer] Requesting Provider: PCP Indication: follow up for osteoporosis Comparison: 10/27/2009 Clinical data: Pt is a 83 y.o. female without history of fracture. On Calcium and vitamin D. Results:  Lumbar spine L1-L4 (L2) Femoral neck (FN) T-score +1.2 RFN: -2.4 LFN: -2.6 Change in BMD from previous DXA test (%) -2.0% -10.2%* (*) statistically significant Assessment: Patient has OSTEOPOROSIS according to the [facility] classification for osteoporosis (see below). Fracture risk: high Comments: the technical quality of the study is good, however, the spine is severely scoliotic and arthritic. Calcium accumulation in arthritic sites can confound the results of the bone density scan. L2 vertebra had to be excluded from analysis due to degenerative changes. Evaluation for secondary causes should be considered if clinically indicated. Recommend optimizing calcium (1200 mg/day) and vitamin D (800 IU/day). Followup: Repeat BMD is appropriate after 2 years or after 1-2 years if starting treatment. WHO criteria for diagnosis of osteoporosis in postmenopausal women and in men 6 y/o or older: - normal: T-score -1.0 to + 1.0 - osteopenia/low bone density: T-score between -2.5 and -1.0 - osteoporosis: T-score below -2.5 - severe  osteoporosis: T-score below -2.5 with history of fragility fracture Note: although not part of the WHO classification, the presence of a fragility fracture, regardless of the T-score, should be considered diagnostic of osteoporosis, provided other causes for the fracture have been excluded. Treatment: The National Osteoporosis Foundation recommends that treatment be considered in postmenopausal women and men age 32 or older with: 1. Hip or vertebral (clinical or morphometric) fracture 2. T-score of - 2.5 or lower at the spine or hip 3. 10-year fracture probability by FRAX of at least 20% for a major osteoporotic fracture and 3% for a hip fracture Philemon Kingdom, MD [facility]    Assessment & Plan:   There are no diagnoses linked to this encounter. I have discontinued Ms. Knighton's sulfamethoxazole-trimethoprim. I am also having her maintain her Calcium Carbonate-Vitamin D, Fluticasone-Salmeterol, vitamin C, torsemide, apixaban, methimazole, Acetaminophen (TYLENOL ARTHRITIS PAIN PO), and ALPRAZolam.  No orders of the defined types were placed in this encounter.    Follow-up: No Follow-up on file.  Walker Kehr, MD

## 2017-03-06 NOTE — Assessment & Plan Note (Signed)
Labs

## 2017-03-06 NOTE — Assessment & Plan Note (Signed)
2018 Estrocream vad and Triamc x 3-4 d

## 2017-03-06 NOTE — Assessment & Plan Note (Signed)
Prolia shots discussed Labs

## 2017-03-07 DIAGNOSIS — L820 Inflamed seborrheic keratosis: Secondary | ICD-10-CM | POA: Diagnosis not present

## 2017-03-07 DIAGNOSIS — L570 Actinic keratosis: Secondary | ICD-10-CM | POA: Diagnosis not present

## 2017-03-07 DIAGNOSIS — L309 Dermatitis, unspecified: Secondary | ICD-10-CM | POA: Diagnosis not present

## 2017-03-07 NOTE — Telephone Encounter (Signed)
error 

## 2017-03-08 LAB — CERVICOVAGINAL ANCILLARY ONLY: WET PREP (BD AFFIRM): NEGATIVE

## 2017-03-23 ENCOUNTER — Telehealth: Payer: Self-pay | Admitting: Internal Medicine

## 2017-03-23 NOTE — Telephone Encounter (Signed)
Pt called stating Dr. Camila Li told her about a shot she can receive 2x per year and she was told her info had been passed along to someone in the office for insurance verification.  She would like a call back with an update and more info on the shot.

## 2017-03-23 NOTE — Telephone Encounter (Signed)
Patient advised that insurance has been verified for prolia injections, no PA required---estimated $0 copay ---patient would like to get injection at her office visit with dr Alain Marion on 04/13/17---I am adding note to that office visit---can talk with tamara if any further questions

## 2017-03-30 NOTE — Telephone Encounter (Signed)
Pt called requesting to speak with you regarding the prolia injection. She had a couple questions for you.

## 2017-04-02 ENCOUNTER — Telehealth (HOSPITAL_COMMUNITY): Payer: Self-pay | Admitting: Pharmacist

## 2017-04-02 ENCOUNTER — Other Ambulatory Visit (HOSPITAL_COMMUNITY): Payer: Self-pay | Admitting: Pharmacist

## 2017-04-02 MED ORDER — APIXABAN 2.5 MG PO TABS: 2.5000 mg | ORAL_TABLET | Freq: Two times a day (BID) | ORAL | 3 refills | Status: DC

## 2017-04-02 NOTE — Telephone Encounter (Signed)
Ms. Garinger called stating that she is now in the donut hole with Medicare Part D and her Eliquis is now $154.95/mo. I have advised her about the [employer] patient assistance program and she will bring me copies of her SSI award letter and a print out from her pharmacy detailing what she has paid for her Rx's since 08/21/16.   Ruta Hinds. Velva Harman, PharmD, BCPS, CPP Clinical Pharmacist Pager: [phone] Phone: [phone] 04/02/2017 10:22 AM

## 2017-04-05 NOTE — Telephone Encounter (Signed)
I have answered several questions about prolia and patient plans to get injection at office visit on 04/13/17---I have added note to that appt

## 2017-04-11 ENCOUNTER — Telehealth (HOSPITAL_COMMUNITY): Payer: Self-pay | Admitting: Pharmacist

## 2017-04-11 NOTE — Telephone Encounter (Signed)
[employer] patient assistance approved for Eliquis 2.5 mg BID through 08/20/17.   Ruta Hinds. Velva Harman, PharmD, BCPS, CPP Clinical Pharmacist Pager: [phone] Phone: [phone] 04/11/2017 12:01 PM

## 2017-04-13 ENCOUNTER — Other Ambulatory Visit (INDEPENDENT_AMBULATORY_CARE_PROVIDER_SITE_OTHER): Payer: Medicare Other

## 2017-04-13 ENCOUNTER — Ambulatory Visit (INDEPENDENT_AMBULATORY_CARE_PROVIDER_SITE_OTHER): Payer: Medicare Other | Admitting: Internal Medicine

## 2017-04-13 ENCOUNTER — Encounter: Payer: Self-pay | Admitting: Internal Medicine

## 2017-04-13 VITALS — BP 128/82 | HR 65 | Temp 97.8°F | Ht 63.0 in | Wt 118.0 lb

## 2017-04-13 DIAGNOSIS — I5022 Chronic systolic (congestive) heart failure: Secondary | ICD-10-CM | POA: Diagnosis not present

## 2017-04-13 DIAGNOSIS — N952 Postmenopausal atrophic vaginitis: Secondary | ICD-10-CM

## 2017-04-13 DIAGNOSIS — N179 Acute kidney failure, unspecified: Secondary | ICD-10-CM

## 2017-04-13 DIAGNOSIS — M2550 Pain in unspecified joint: Secondary | ICD-10-CM

## 2017-04-13 DIAGNOSIS — Z0000 Encounter for general adult medical examination without abnormal findings: Secondary | ICD-10-CM | POA: Diagnosis not present

## 2017-04-13 DIAGNOSIS — F419 Anxiety disorder, unspecified: Secondary | ICD-10-CM

## 2017-04-13 DIAGNOSIS — N189 Chronic kidney disease, unspecified: Secondary | ICD-10-CM | POA: Diagnosis not present

## 2017-04-13 DIAGNOSIS — M810 Age-related osteoporosis without current pathological fracture: Secondary | ICD-10-CM

## 2017-04-13 LAB — TSH: TSH: 7.68 u[IU]/mL — ABNORMAL HIGH (ref 0.35–4.50)

## 2017-04-13 LAB — CBC WITH DIFFERENTIAL/PLATELET
BASOS ABS: 0.1 10*3/uL (ref 0.0–0.1)
Basophils Relative: 1.3 % (ref 0.0–3.0)
Eosinophils Absolute: 0.1 10*3/uL (ref 0.0–0.7)
Eosinophils Relative: 1.8 % (ref 0.0–5.0)
HEMATOCRIT: 41.4 % (ref 36.0–46.0)
Hemoglobin: 13.9 g/dL (ref 12.0–15.0)
LYMPHS PCT: 20.7 % (ref 12.0–46.0)
Lymphs Abs: 1.4 10*3/uL (ref 0.7–4.0)
MCHC: 33.7 g/dL (ref 30.0–36.0)
MCV: 93.8 fl (ref 78.0–100.0)
MONOS PCT: 8.5 % (ref 3.0–12.0)
Monocytes Absolute: 0.6 10*3/uL (ref 0.1–1.0)
Neutro Abs: 4.6 10*3/uL (ref 1.4–7.7)
Neutrophils Relative %: 67.7 % (ref 43.0–77.0)
Platelets: 294 10*3/uL (ref 150.0–400.0)
RBC: 4.41 Mil/uL (ref 3.87–5.11)
RDW: 14.2 % (ref 11.5–15.5)
WBC: 6.7 10*3/uL (ref 4.0–10.5)

## 2017-04-13 LAB — CK: Total CK: 264 U/L — ABNORMAL HIGH (ref 7–177)

## 2017-04-13 LAB — BASIC METABOLIC PANEL
BUN: 23 mg/dL (ref 6–23)
CALCIUM: 9.9 mg/dL (ref 8.4–10.5)
CO2: 34 meq/L — AB (ref 19–32)
Chloride: 98 mEq/L (ref 96–112)
Creatinine, Ser: 1.3 mg/dL — ABNORMAL HIGH (ref 0.40–1.20)
GFR: 41.3 mL/min — ABNORMAL LOW (ref >60.00)
Glucose, Bld: 88 mg/dL (ref 70–99)
Potassium: 3.5 mEq/L (ref 3.5–5.1)
SODIUM: 141 meq/L (ref 135–145)

## 2017-04-13 LAB — HEPATIC FUNCTION PANEL
ALBUMIN: 4.7 g/dL (ref 3.5–5.2)
ALK PHOS: 103 U/L (ref 39–117)
ALT: 12 U/L (ref 0–35)
AST: 22 U/L (ref 0–37)
Bilirubin, Direct: 0.1 mg/dL (ref 0.0–0.3)
TOTAL PROTEIN: 7.4 g/dL (ref 6.0–8.3)
Total Bilirubin: 0.8 mg/dL (ref 0.2–1.2)

## 2017-04-13 LAB — SEDIMENTATION RATE: Sed Rate: 11 mm/hr (ref 0–30)

## 2017-04-13 MED ORDER — DENOSUMAB 60 MG/ML ~~LOC~~ SOLN
60.0000 mg | Freq: Once | SUBCUTANEOUS | Status: AC
  Administered 2017-04-13: 60 mg via SUBCUTANEOUS

## 2017-04-13 NOTE — Assessment & Plan Note (Signed)
r/o PMR - ?early stages vs other Tylenol prn Consider low dose steroids

## 2017-04-13 NOTE — Progress Notes (Signed)
Subjective:  Patient ID: Mia Contreras, female    DOB: 1928/10/08  Age: 83 y.o. MRN: [id number]  CC: No chief complaint on file.   HPI Mia Contreras presents for B shoulders, arms, hips pains x weeks-months; worse at night - feels like a toothache...better w/activity. Tylenol helps... F/u CHF, anxiety  Outpatient Medications Prior to Visit  Medication Sig Dispense Refill  . Acetaminophen (TYLENOL ARTHRITIS PAIN PO) Take 1 tablet by mouth daily as needed (pain).    Marland Kitchen ALPRAZolam (XANAX) 0.5 MG tablet Take 1 tablet (0.5 mg total) by mouth 2 (two) times daily as needed for anxiety. 60 tablet 2  . apixaban (ELIQUIS) 2.5 MG TABS tablet Take 1 tablet (2.5 mg total) by mouth 2 (two) times daily. 180 tablet 3  . Calcium Carbonate-Vitamin D (CALCIUM 600+D) 600-400 MG-UNIT per tablet Take 2 tablets by mouth daily.     Marland Kitchen estradiol (ESTRACE) 0.1 MG/GM vaginal cream Place 1 Applicatorful vaginally 3 (three) times a week. 42.5 g 12  . Fluticasone-Salmeterol (ADVAIR DISKUS) 250-50 MCG/DOSE AEPB Inhale 1 puff into the lungs 2 (two) times daily. 60 each 3  . methimazole (TAPAZOLE) 10 MG tablet Take 2 tablets (20 mg total) by mouth 2 (two) times daily. 360 tablet 1  . torsemide (DEMADEX) 20 MG tablet Take 2 tablets (40 mg total) by mouth daily. 180 tablet 6  . triamcinolone ointment (KENALOG) 0.1 % Apply 1 application topically 2 (two) times daily. 60 g 1  . vitamin C (ASCORBIC ACID) 500 MG tablet Take 1 tablet (500 mg total) by mouth daily. 100 tablet 3   No facility-administered medications prior to visit.     ROS Review of Systems  Constitutional: Positive for fatigue. Negative for activity change, appetite change, chills and unexpected weight change.  HENT: Negative for congestion, dental problem, mouth sores, rhinorrhea and sinus pressure.   Eyes: Negative for visual disturbance.  Respiratory: Negative for cough and chest tightness.   Gastrointestinal: Negative for abdominal pain and nausea.    Genitourinary: Negative for difficulty urinating, frequency and vaginal pain.  Musculoskeletal: Positive for arthralgias, back pain, gait problem and neck stiffness.  Skin: Negative for pallor and rash.  Neurological: Negative for dizziness, tremors, weakness, numbness and headaches.  Psychiatric/Behavioral: Positive for sleep disturbance. Negative for confusion and suicidal ideas. The patient is nervous/anxious.     Objective:  BP 128/82 (BP Location: Right Arm, Patient Position: Sitting, Cuff Size: Normal)   Pulse 65   Temp 97.8 F (36.6 C) (Oral)   Ht 5\' 3"  (1.6 m)   Wt 118 lb (53.5 kg)   SpO2 98%   BMI 20.90 kg/m   BP Readings from Last 3 Encounters:  04/13/17 128/82  03/06/17 134/80  01/18/17 136/80    Wt Readings from Last 3 Encounters:  04/13/17 118 lb (53.5 kg)  03/06/17 118 lb (53.5 kg)  01/18/17 116 lb (52.6 kg)    Physical Exam  Constitutional: She appears well-developed. No distress.  HENT:  Head: Normocephalic.  Right Ear: External ear normal.  Left Ear: External ear normal.  Nose: Nose normal.  Mouth/Throat: Oropharynx is clear and moist.  Eyes: Pupils are equal, round, and reactive to light. Conjunctivae are normal. Right eye exhibits no discharge. Left eye exhibits no discharge.  Neck: Normal range of motion. Neck supple. No JVD present. No tracheal deviation present. No thyromegaly present.  Cardiovascular: Normal rate, regular rhythm and normal heart sounds.   Pulmonary/Chest: No stridor. No respiratory distress. She has no  wheezes.  Abdominal: Soft. Bowel sounds are normal. She exhibits no distension and no mass. There is no tenderness. There is no rebound and no guarding.  Musculoskeletal: She exhibits tenderness. She exhibits no edema.  Lymphadenopathy:    She has no cervical adenopathy.  Neurological: She displays normal reflexes. No cranial nerve deficit. She exhibits normal muscle tone. Coordination normal.  Skin: No rash noted. No erythema.   Psychiatric: She has a normal mood and affect. Her behavior is normal. Judgment and thought content normal.    Lab Results  Component Value Date   WBC 7.9 10/04/2016   HGB 13.0 10/04/2016   HCT 39.5 10/04/2016   PLT 360.0 10/04/2016   GLUCOSE 99 03/06/2017   CHOL 130 11/04/2015   TRIG 77.0 11/04/2015   HDL 54.50 11/04/2015   LDLCALC 60 11/04/2015   ALT 21 10/04/2016   AST 25 10/04/2016   NA 141 03/06/2017   K 3.5 03/06/2017   CL 98 03/06/2017   CREATININE 1.28 (H) 03/06/2017   BUN 26 (H) 03/06/2017   CO2 33 (H) 03/06/2017   TSH 0.69 01/18/2017   INR 0.95 10/25/2011   HGBA1C 5.8 02/06/2014   MICROALBUR 0.6 03/23/2011    No results found.  Assessment & Plan:   There are no diagnoses linked to this encounter. I am having Ms. Latterell maintain her Calcium Carbonate-Vitamin D, Fluticasone-Salmeterol, vitamin C, methimazole, Acetaminophen (TYLENOL ARTHRITIS PAIN PO), triamcinolone ointment, estradiol, ALPRAZolam, torsemide, and apixaban.  No orders of the defined types were placed in this encounter.    Follow-up: No Follow-up on file.  Walker Kehr, MD

## 2017-04-13 NOTE — Patient Instructions (Addendum)
Continue doing brain stimulating activities (puzzles, reading, adult coloring books, staying active) to keep memory sharp.   Continue to eat heart healthy diet (full of fruits, vegetables, whole grains, lean protein, water--limit salt, fat, and sugar intake) and increase physical activity as tolerated.   Mia Contreras , Thank you for taking time to come for your Medicare Wellness Visit. I appreciate your ongoing commitment to your health goals. Please review the following plan we discussed and let me know if I can assist you in the future.   These are the goals we discussed: Goals    . Continue to enjoy my patio, planting flowers, watch the birds in my bird bath       This is a list of the screening recommended for you and due dates:  Health Maintenance  Topic Date Due  . Flu Shot  03/21/2017  . Tetanus Vaccine  02/26/2021  . DEXA scan (bone density measurement)  Completed  . Pneumonia vaccines  Completed      Possible Polymyalgia Rheumatica Polymyalgia rheumatica (PMR) is an inflammatory disorder that causes aching and stiffness in your muscles and joints. Sometimes, PMR leads to a more dangerous condition (temporal arteritis or giant cell arteritis), which can cause vision loss. What are the causes? The exact cause of PMR is not known. What increases the risk? This condition is more likely to develop in:  Females.  People who are 35 years of age or older.  Caucasians.  What are the signs or symptoms?  Pain and stiffness are the main symptoms of PMR. Symptoms may start slowly or suddenly. The symptoms:  May be worse after inactivity and in the morning.  May affect your: ? Hips, buttocks, and thighs. ? Neck, arms, and shoulders. This can make it hard to raise your arms above your head. ? Hands and wrists.  Other symptoms include:  Fever.  Tiredness.  Weakness.  Decreased appetite. This may lead to weight loss.  How is this diagnosed? This condition is  diagnosed with a medical history and physical exam. You may need to see a health care provider who specializes in diseases of the joint, muscles, and bones (rheumatologist). You may also have tests, including:  Blood tests.  X-rays.  How is this treated? PMR usually goes away without treatment, but it may take years for that to happen. In the meantime, your health care provider may recommend low-dose steroids to help manage your symptoms of pain and stiffness. Regular exercise and rest will also help your symptoms. Follow these instructions at home:  Take over-the-counter and prescription medicines only as told by your health care provider.  Make sure to get enough rest and sleep.  Eat a healthy and nutritious diet.  Try to exercise most days of the week. Ask your health care provider what type of exercise is best for you.  Keep all follow-up visits as told by your health are provider. This is important. Contact a health care provider if:  Your symptoms are not controlled with medicine.  You have side effects from steroids. These may include: ? Weight gain. ? Swelling. ? Insomnia. ? Mood changes. ? Bruising. ? High blood sugar readings, if you have diabetes. ? Higher than normal blood pressure readings, if you monitor your blood pressure. Get help right away if:  You develop symptoms of temporal arteritis, such as: ? A change in vision. ? Severe headache. ? Scalp pain. ? Jaw pain. This information is not intended to replace advice given to  you by your health care provider. Make sure you discuss any questions you have with your health care provider. Document Released: 09/14/2004 Document Revised: 01/13/2016 Document Reviewed: 02/17/2015 Elsevier Interactive Patient Education  [employer].

## 2017-04-13 NOTE — Assessment & Plan Note (Signed)
Estrocream vad and Triamc x 3-4 d

## 2017-04-13 NOTE — Assessment & Plan Note (Signed)
On Eliquis,Torsemide Off O2 [place]

## 2017-04-13 NOTE — Assessment & Plan Note (Signed)
Potential benefits of a long term benzodiazepines  use as well as potential risks  and complications were explained to the patient and were aknowledged. Alprazolam prn

## 2017-04-13 NOTE — Progress Notes (Signed)
Pre visit review using our clinic review tool, if applicable. No additional management support is needed unless otherwise documented below in the visit note. 

## 2017-04-13 NOTE — Progress Notes (Addendum)
Subjective:   Mia Contreras is a 83 y.o. female who presents for Medicare Annual (Subsequent) preventive examination.  Review of Systems:  No ROS.  Medicare Wellness Visit. Additional risk factors are reflected in the social history.  Cardiac Risk Factors include: advanced age (>1men, >15 women);hypertension Sleep patterns: has difficulty awakening, gets up 1-2 times nightly to void and sleeps 6-7 hours nightly.    Home Safety/Smoke Alarms: Feels safe in home. Smoke alarms in place.  Living environment; residence and Adult nurse: apartment, equipment: Walkers, Type: Conservation officer, nature, no firearms.Lives alone, no needs for DME, limited support system Seat Belt Safety/Bike Helmet: Wears seat belt.      Objective:     Vitals: BP 128/82 (BP Location: Right Arm, Patient Position: Sitting, Cuff Size: Normal)   Pulse 65   Temp 97.8 F (36.6 C) (Oral)   Ht 5\' 3"  (1.6 m)   Wt 118 lb (53.5 kg)   SpO2 98%   BMI 20.90 kg/m   Body mass index is 20.9 kg/m.   Tobacco History  Smoking Status  . Former Smoker  . Quit date: 12/20/1980  Smokeless Tobacco  . Never Used     Counseling given: Not Answered   Past Medical History:  Diagnosis Date  . AKI (acute kidney injury) ([place]) 01/2016  . ANEMIA-NOS 08/20/2007  . ANXIETY 03/16/2007  . BREAST CANCER, HX OF 03/16/2007  . CARPAL TUNNEL SYNDROME, RIGHT 01/11/2008  . Cataract    floaters  . CELLULITIS, LEG, RIGHT 08/20/2007  . Collagenous colitis   . COPD (chronic obstructive pulmonary disease) ([place])   . DIVERTICULOSIS, COLON 08/20/2007  . GOITER, MULTINODULAR 05/06/2010   Dr [name]  . HYPERLIPIDEMIA 08/20/2007  . HYPERTENSION 03/16/2007  . HYPERTHYROIDISM 05/23/2010  . IBS (irritable bowel syndrome)   . OSTEOARTHRITIS 03/16/2007  . OSTEOPOROSIS 08/20/2007  . Pneumonia 07/2011   took abx for several weeks  . PREMATURE ATRIAL CONTRACTIONS 09/21/2010  . SCOLIOSIS 01/11/2008  . SKIN CANCER, HX OF 11/12/2008    R cheek 2010, L Cheek  2011 Dr. Tonia Brooms  . Thyroid nodule   . VENOUS INSUFFICIENCY 09/05/2007   Past Surgical History:  Procedure Laterality Date  . ABDOMINAL HYSTERECTOMY    . APPENDECTOMY  1972  . BREAST LUMPECTOMY Left 2001  . CARPAL TUNNEL RELEASE    . CATARACT EXTRACTION    . EYE SURGERY     cataract ext  . HERNIA REPAIR    . MASTECTOMY, PARTIAL Left   . SKIN CANCER EXCISION     face-Dr. Bing Plume  . TOTAL KNEE ARTHROPLASTY    . TOTAL KNEE ARTHROPLASTY Right 2010   Dr Para March  . TOTAL KNEE ARTHROPLASTY  10/30/2011   Procedure: TOTAL KNEE ARTHROPLASTY;  Surgeon: Lorn Junes, MD;  Location: [facility];  Service: Orthopedics;  Laterality: Left;     . XRT     chemo, surgery   Family History  Problem Relation Age of Onset  . Dementia Mother   . Heart disease Mother   . Mental retardation Mother   . Hypertension Mother   . Alzheimer's disease Mother   . Heart attack Father   . Alcohol abuse Father   . Prostate cancer Brother   . Diabetes Other   . Colon cancer Neg Hx   . Anesthesia problems Neg Hx    History  Sexual Activity  . Sexual activity: Not Currently    Outpatient Encounter Prescriptions as of 04/13/2017  Medication Sig  . Acetaminophen (TYLENOL ARTHRITIS PAIN  PO) Take 1 tablet by mouth daily as needed (pain).  Marland Kitchen ALPRAZolam (XANAX) 0.5 MG tablet Take 1 tablet (0.5 mg total) by mouth 2 (two) times daily as needed for anxiety.  Marland Kitchen apixaban (ELIQUIS) 2.5 MG TABS tablet Take 1 tablet (2.5 mg total) by mouth 2 (two) times daily.  . Calcium Carbonate-Vitamin D (CALCIUM 600+D) 600-400 MG-UNIT per tablet Take 2 tablets by mouth daily.   Marland Kitchen estradiol (ESTRACE) 0.1 MG/GM vaginal cream Place 1 Applicatorful vaginally 3 (three) times a week.  . Fluticasone-Salmeterol (ADVAIR DISKUS) 250-50 MCG/DOSE AEPB Inhale 1 puff into the lungs 2 (two) times daily.  . methimazole (TAPAZOLE) 10 MG tablet Take 2 tablets (20 mg total) by mouth 2 (two) times daily.  Marland Kitchen torsemide (DEMADEX) 20 MG tablet Take 2 tablets (40  mg total) by mouth daily.  Marland Kitchen triamcinolone ointment (KENALOG) 0.1 % Apply 1 application topically 2 (two) times daily.  . vitamin C (ASCORBIC ACID) 500 MG tablet Take 1 tablet (500 mg total) by mouth daily.  . [EXPIRED] denosumab (PROLIA) injection 60 mg    No facility-administered encounter medications on file as of 04/13/2017.     Activities of Daily Living In your present state of health, do you have any difficulty performing the following activities: 04/13/2017 08/11/2016  Hearing? N N  Vision? N N  Difficulty concentrating or making decisions? N N  Walking or climbing stairs? N Y  Dressing or bathing? N N  Doing errands, shopping? N Y  Conservation officer, nature and eating ? N N  Using the Toilet? N N  In the past six months, have you accidently leaked urine? N N  Do you have problems with loss of bowel control? N N  Managing your Medications? N N  Managing your Finances? N N  Housekeeping or managing your Housekeeping? N Y  Some recent data might be hidden    Patient Care Team: Plotnikov, Evie Lacks, MD as PCP - General Sable Feil, MD as Attending Physician (Gastroenterology) Josue Hector, MD as Attending Physician (Cardiology)    Assessment:    Physical assessment deferred to PCP.  Exercise Activities and Dietary recommendations Current Exercise Habits: The patient does not participate in regular exercise at present, Exercise limited by: orthopedic condition(s)  Diet (meal preparation, eat out, water intake, caffeinated beverages, dairy products, fruits and vegetables): in general, a "healthy" diet  , well balanced, eats a variety of fruits and vegetables daily, limits salt, fat/cholesterol, sugar, caffeine, drinks 6-8 glasses of water daily.   Goals    . Continue to enjoy my patio, planting flowers, watch the birds in my bird bath      Fall Risk Fall Risk  04/13/2017 07/31/2016 02/10/2016 12/30/2015 12/24/2015  Falls in the past year? Yes No Yes Yes Yes  Number falls in  past yr: 2 or more - 2 or more 2 or more 2 or more  Injury with Fall? No - Yes Yes Yes  Risk Factor Category  - - High Fall Risk - -  Risk for fall due to : Impaired mobility;Impaired balance/gait Impaired balance/gait History of fall(s);Impaired balance/gait;Impaired mobility History of fall(s) -  Follow up Falls prevention discussed - Education provided;Falls prevention discussed Falls prevention discussed;Education provided -   Depression Screen PHQ 2/9 Scores 04/13/2017 07/31/2016 02/10/2016 12/30/2015  PHQ - 2 Score 3 0 1 0  PHQ- 9 Score 7 - - -     Cognitive Function MMSE - Mini Mental State Exam 04/13/2017  Orientation to time  5  Orientation to Place 5  Registration 3  Attention/ Calculation 4  Recall 1  Language- name 2 objects 2  Language- repeat 1  Language- follow 3 step command 3  Language- read & follow direction 1  Write a sentence 1  Copy design 1  Total score 27        Immunization History  Administered Date(s) Administered  . Influenza Whole 05/12/2010, 04/22/2011, 04/21/2012  . Influenza, High Dose Seasonal PF 04/20/2016  . Influenza,inj,Quad PF,6+ Mos 05/08/2014, 05/12/2015  . Influenza-Unspecified 04/21/2013  . Pneumococcal Conjugate-13 05/08/2014  . Pneumococcal Polysaccharide-23 08/21/2004, 11/10/2015  . Td 04/21/2006  . Tdap 02/27/2011  . Zoster 12/18/2013   Screening Tests Health Maintenance  Topic Date Due  . INFLUENZA VACCINE  03/21/2017  . TETANUS/TDAP  02/26/2021  . DEXA SCAN  Completed  . PNA vac Low Risk Adult  Completed      Plan:    Continue doing brain stimulating activities (puzzles, reading, adult coloring books, staying active) to keep memory sharp.   Continue to eat heart healthy diet (full of fruits, vegetables, whole grains, lean protein, water--limit salt, fat, and sugar intake) and increase physical activity as tolerated.   I have personally reviewed and noted the following in the patient's chart:   . Medical and social  history . Use of alcohol, tobacco or illicit drugs  . Current medications and supplements . Functional ability and status . Nutritional status . Physical activity . Advanced directives . List of other physicians . Vitals . Screenings to include cognitive, depression, and falls . Referrals and appointments  In addition, I have reviewed and discussed with patient certain preventive protocols, quality metrics, and best practice recommendations. A written personalized care plan for preventive services as well as general preventive health recommendations were provided to patient.     Michiel Cowboy, RN  04/13/2017  Medical screening examination/treatment/procedure(s) were performed by non-physician practitioner and as supervising physician I was immediately available for consultation/collaboration. I agree with above. Walker Kehr, MD

## 2017-04-15 ENCOUNTER — Other Ambulatory Visit: Payer: Self-pay | Admitting: Internal Medicine

## 2017-04-15 DIAGNOSIS — M2550 Pain in unspecified joint: Secondary | ICD-10-CM

## 2017-04-20 ENCOUNTER — Ambulatory Visit (INDEPENDENT_AMBULATORY_CARE_PROVIDER_SITE_OTHER): Payer: Medicare Other | Admitting: Endocrinology

## 2017-04-20 ENCOUNTER — Encounter: Payer: Self-pay | Admitting: Endocrinology

## 2017-04-20 VITALS — BP 112/64 | HR 68 | Wt 118.6 lb

## 2017-04-20 DIAGNOSIS — E0520 Thyrotoxicosis with toxic multinodular goiter without thyrotoxic crisis or storm: Secondary | ICD-10-CM

## 2017-04-20 MED ORDER — METHIMAZOLE 10 MG PO TABS: 10.0000 mg | ORAL_TABLET | Freq: Two times a day (BID) | ORAL | 2 refills | Status: DC

## 2017-04-20 NOTE — Patient Instructions (Addendum)
Please reduce the thyroid pill to 1 pill, twice a day.  I have sent a prescription to your pharmacy. If ever you have fever while taking methimazole, stop it and call us, even if the reason is obvious, because of the risk of a rare side-effect.  Please come back for a follow-up appointment in 2 months.

## 2017-04-20 NOTE — Progress Notes (Signed)
Subjective:    Patient ID: Mia Contreras, female    DOB: 04/23/1929, 83 y.o.   MRN: [id number]  HPI Pt returns for f/u of hyperthyroidism (due to large multinodular goiter; dx'ed 2008; bx then showed HYPERPLASTICNODULE. in 2011, pt had RAI for hyperthyroidism; she became euthyroid, last [name] in early 2015 was unchanged; in 2017, hyperthyroidism recurred; plan was for repeat RAI, but RAI uptake was low; pt says she does not take any iodine-containing products, so she was rx'ed tapazole).  She reports myalgias.   Past Medical History:  Diagnosis Date  . AKI (acute kidney injury) ([place]) 01/2016  . ANEMIA-NOS 08/20/2007  . ANXIETY 03/16/2007  . BREAST CANCER, HX OF 03/16/2007  . CARPAL TUNNEL SYNDROME, RIGHT 01/11/2008  . Cataract    floaters  . CELLULITIS, LEG, RIGHT 08/20/2007  . Collagenous colitis   . COPD (chronic obstructive pulmonary disease) ([place])   . DIVERTICULOSIS, COLON 08/20/2007  . GOITER, MULTINODULAR 05/06/2010   Dr [name]  . HYPERLIPIDEMIA 08/20/2007  . HYPERTENSION 03/16/2007  . HYPERTHYROIDISM 05/23/2010  . IBS (irritable bowel syndrome)   . OSTEOARTHRITIS 03/16/2007  . OSTEOPOROSIS 08/20/2007  . Pneumonia 07/2011   took abx for several weeks  . PREMATURE ATRIAL CONTRACTIONS 09/21/2010  . SCOLIOSIS 01/11/2008  . SKIN CANCER, HX OF 11/12/2008    R cheek 2010, L Cheek 2011 Dr. Tonia Brooms  . Thyroid nodule   . VENOUS INSUFFICIENCY 09/05/2007    Past Surgical History:  Procedure Laterality Date  . ABDOMINAL HYSTERECTOMY    . APPENDECTOMY  1972  . BREAST LUMPECTOMY Left 2001  . CARPAL TUNNEL RELEASE    . CATARACT EXTRACTION    . EYE SURGERY     cataract ext  . HERNIA REPAIR    . MASTECTOMY, PARTIAL Left   . SKIN CANCER EXCISION     face-Dr. Bing Plume  . TOTAL KNEE ARTHROPLASTY    . TOTAL KNEE ARTHROPLASTY Right 2010   Dr Para March  . TOTAL KNEE ARTHROPLASTY  10/30/2011   Procedure: TOTAL KNEE ARTHROPLASTY;  Surgeon: Lorn Junes, MD;  Location: [facility];  Service:  Orthopedics;  Laterality: Left;     . XRT     chemo, surgery    Social History   Social History  . Marital status: Single    Spouse name: N/A  . Number of children: N/A  . Years of education: N/A   Occupational History  . Not on file.   Social History Main Topics  . Smoking status: Former Smoker    Quit date: 12/20/1980  . Smokeless tobacco: Never Used  . Alcohol use Yes     Comment: socially  . Drug use: No  . Sexual activity: Not Currently   Other Topics Concern  . Not on file   Social History Narrative  . No narrative on file    Current Outpatient Prescriptions on File Prior to Visit  Medication Sig Dispense Refill  . Acetaminophen (TYLENOL ARTHRITIS PAIN PO) Take 1 tablet by mouth daily as needed (pain).    Marland Kitchen ALPRAZolam (XANAX) 0.5 MG tablet Take 1 tablet (0.5 mg total) by mouth 2 (two) times daily as needed for anxiety. 60 tablet 2  . apixaban (ELIQUIS) 2.5 MG TABS tablet Take 1 tablet (2.5 mg total) by mouth 2 (two) times daily. 180 tablet 3  . Calcium Carbonate-Vitamin D (CALCIUM 600+D) 600-400 MG-UNIT per tablet Take 2 tablets by mouth daily.     Marland Kitchen estradiol (ESTRACE) 0.1 MG/GM vaginal cream Place 1 Applicatorful  vaginally 3 (three) times a week. 42.5 g 12  . Fluticasone-Salmeterol (ADVAIR DISKUS) 250-50 MCG/DOSE AEPB Inhale 1 puff into the lungs 2 (two) times daily. 60 each 3  . torsemide (DEMADEX) 20 MG tablet Take 2 tablets (40 mg total) by mouth daily. 180 tablet 6  . triamcinolone ointment (KENALOG) 0.1 % Apply 1 application topically 2 (two) times daily. 60 g 1  . vitamin C (ASCORBIC ACID) 500 MG tablet Take 1 tablet (500 mg total) by mouth daily. 100 tablet 3   No current facility-administered medications on file prior to visit.     Allergies  Allergen Reactions  . Cefuroxime Diarrhea  . Celecoxib Nausea Only  . Deltasone [Prednisone]     Oral deltasone is causing swelling (can use Depo-Medrol)  . Ranitidine     bloating  . Spironolactone      High K  . Tramadol Hcl Nausea Only    Family History  Problem Relation Age of Onset  . Dementia Mother   . Heart disease Mother   . Mental retardation Mother   . Hypertension Mother   . Alzheimer's disease Mother   . Heart attack Father   . Alcohol abuse Father   . Prostate cancer Brother   . Diabetes Other   . Colon cancer Neg Hx   . Anesthesia problems Neg Hx     BP 112/64   Pulse 68   Wt 118 lb 9.6 oz (53.8 kg)   SpO2 96%   BMI 21.01 kg/m    Review of Systems Denies fever.      Objective:   Physical Exam VITAL SIGNS:  See vs page GENERAL: no distress Neck: large multinodular goiter (L>>R) is again noted.  Neuro: coarse tremor of the hands.   Skin: not diaphoretic.   Lab Results  Component Value Date   TSH 7.68 (H) 04/13/2017      Assessment & Plan:  elev CPK, not thyroid-related.  Hyperthyroidism: slightly overcontrolled.   Patient Instructions  Please reduce the thyroid pill to 1 pill, twice a day.  I have sent a prescription to your pharmacy. If ever you have fever while taking methimazole, stop it and call us, even if the reason is obvious, because of the risk of a rare side-effect.  Please come back for a follow-up appointment in 2 months.

## 2017-04-30 DIAGNOSIS — R21 Rash and other nonspecific skin eruption: Secondary | ICD-10-CM | POA: Diagnosis not present

## 2017-04-30 DIAGNOSIS — L400 Psoriasis vulgaris: Secondary | ICD-10-CM | POA: Diagnosis not present

## 2017-04-30 DIAGNOSIS — L821 Other seborrheic keratosis: Secondary | ICD-10-CM | POA: Diagnosis not present

## 2017-04-30 DIAGNOSIS — L570 Actinic keratosis: Secondary | ICD-10-CM | POA: Diagnosis not present

## 2017-04-30 DIAGNOSIS — D485 Neoplasm of uncertain behavior of skin: Secondary | ICD-10-CM | POA: Diagnosis not present

## 2017-04-30 DIAGNOSIS — Z23 Encounter for immunization: Secondary | ICD-10-CM | POA: Diagnosis not present

## 2017-04-30 DIAGNOSIS — B079 Viral wart, unspecified: Secondary | ICD-10-CM | POA: Diagnosis not present

## 2017-05-11 DIAGNOSIS — Z853 Personal history of malignant neoplasm of breast: Secondary | ICD-10-CM | POA: Diagnosis not present

## 2017-05-11 DIAGNOSIS — Z1231 Encounter for screening mammogram for malignant neoplasm of breast: Secondary | ICD-10-CM | POA: Diagnosis not present

## 2017-05-11 LAB — HM MAMMOGRAPHY

## 2017-05-17 ENCOUNTER — Other Ambulatory Visit (INDEPENDENT_AMBULATORY_CARE_PROVIDER_SITE_OTHER): Payer: Medicare Other

## 2017-05-17 DIAGNOSIS — M2550 Pain in unspecified joint: Secondary | ICD-10-CM

## 2017-05-17 LAB — SEDIMENTATION RATE: SED RATE: 9 mm/h (ref 0–30)

## 2017-05-17 LAB — CK: CK TOTAL: 543 U/L — AB (ref 7–177)

## 2017-05-17 NOTE — Addendum Note (Signed)
Addended by: Sallye Ober on: 05/17/2017 11:35 AM   Modules accepted: Orders

## 2017-05-18 ENCOUNTER — Encounter: Payer: Self-pay | Admitting: Internal Medicine

## 2017-05-18 ENCOUNTER — Other Ambulatory Visit: Payer: Medicare Other

## 2017-05-18 DIAGNOSIS — M2550 Pain in unspecified joint: Secondary | ICD-10-CM

## 2017-05-18 LAB — TIQ-NTM

## 2017-05-18 LAB — ALDOLASE

## 2017-05-18 LAB — RHEUMATOID FACTOR: Rhuematoid fact SerPl-aCnc: 19 IU/mL — ABNORMAL HIGH (ref <14)

## 2017-05-21 ENCOUNTER — Other Ambulatory Visit: Payer: Medicare Other

## 2017-05-21 LAB — ALDOLASE: Aldolase: 5.8 U/L (ref <=8.1)

## 2017-05-22 ENCOUNTER — Other Ambulatory Visit: Payer: Self-pay | Admitting: Endocrinology

## 2017-05-22 ENCOUNTER — Ambulatory Visit (INDEPENDENT_AMBULATORY_CARE_PROVIDER_SITE_OTHER): Payer: Medicare Other | Admitting: Internal Medicine

## 2017-05-22 ENCOUNTER — Encounter: Payer: Self-pay | Admitting: Internal Medicine

## 2017-05-22 VITALS — BP 128/68 | HR 71 | Temp 98.5°F | Ht 63.0 in | Wt 119.0 lb

## 2017-05-22 DIAGNOSIS — Z23 Encounter for immunization: Secondary | ICD-10-CM | POA: Diagnosis not present

## 2017-05-22 DIAGNOSIS — M1990 Unspecified osteoarthritis, unspecified site: Secondary | ICD-10-CM

## 2017-05-22 DIAGNOSIS — R21 Rash and other nonspecific skin eruption: Secondary | ICD-10-CM | POA: Diagnosis not present

## 2017-05-22 DIAGNOSIS — N184 Chronic kidney disease, stage 4 (severe): Secondary | ICD-10-CM

## 2017-05-22 MED ORDER — PROPYLTHIOURACIL 50 MG PO TABS: 100.0000 mg | ORAL_TABLET | Freq: Two times a day (BID) | ORAL | 5 refills | Status: DC

## 2017-05-22 MED ORDER — ALPRAZOLAM 0.5 MG PO TABS: 0.5000 mg | ORAL_TABLET | Freq: Two times a day (BID) | ORAL | 2 refills | Status: DC | PRN

## 2017-05-22 MED ORDER — METHYLPREDNISOLONE ACETATE 80 MG/ML IJ SUSP
80.0000 mg | Freq: Once | INTRAMUSCULAR | Status: AC
  Administered 2017-05-22: 80 mg via INTRAMUSCULAR

## 2017-05-22 NOTE — Assessment & Plan Note (Addendum)
?  tapazole related pains Borderline RA tests, elevated CK

## 2017-05-22 NOTE — Addendum Note (Signed)
Addended by: Cassandria Anger on: 05/22/2017 02:53 PM   Modules accepted: Orders

## 2017-05-22 NOTE — Assessment & Plan Note (Addendum)
Summer 2018 ?Tapazole related Will ask Dr [name] if we can substitute Dep-omedrol 80 mg/d

## 2017-05-22 NOTE — Assessment & Plan Note (Signed)
Labs

## 2017-05-22 NOTE — Addendum Note (Signed)
Addended by: Karren Cobble on: 05/22/2017 04:52 PM   Modules accepted: Orders

## 2017-05-22 NOTE — Progress Notes (Signed)
Subjective:  Patient ID: Mia Contreras, female    DOB: Dec 03, 1928  Age: 83 y.o. MRN: [id number]  CC: No chief complaint on file.   HPI Mia Contreras presents for rash on the chest x 6 weeks. BJ has been seeingsaw her dermatologist Dr Mia Contreras.  She had a bx - "psoriasis" per pt C/o fatigue, dry tongue, anxiety  Outpatient Medications Prior to Visit  Medication Sig Dispense Refill  . Acetaminophen (TYLENOL ARTHRITIS PAIN PO) Take 1 tablet by mouth daily as needed (pain).    Marland Kitchen ALPRAZolam (XANAX) 0.5 MG tablet Take 1 tablet (0.5 mg total) by mouth 2 (two) times daily as needed for anxiety. 60 tablet 2  . apixaban (ELIQUIS) 2.5 MG TABS tablet Take 1 tablet (2.5 mg total) by mouth 2 (two) times daily. 180 tablet 3  . Calcium Carbonate-Vitamin D (CALCIUM 600+D) 600-400 MG-UNIT per tablet Take 2 tablets by mouth daily.     Marland Kitchen estradiol (ESTRACE) 0.1 MG/GM vaginal cream Place 1 Applicatorful vaginally 3 (three) times a week. 42.5 g 12  . Fluticasone-Salmeterol (ADVAIR DISKUS) 250-50 MCG/DOSE AEPB Inhale 1 puff into the lungs 2 (two) times daily. 60 each 3  . methimazole (TAPAZOLE) 10 MG tablet Take 1 tablet (10 mg total) by mouth 2 (two) times daily. 180 tablet 2  . torsemide (DEMADEX) 20 MG tablet Take 2 tablets (40 mg total) by mouth daily. 180 tablet 6  . triamcinolone ointment (KENALOG) 0.1 % Apply 1 application topically 2 (two) times daily. 60 g 1  . vitamin C (ASCORBIC ACID) 500 MG tablet Take 1 tablet (500 mg total) by mouth daily. 100 tablet 3   No facility-administered medications prior to visit.     ROS Review of Systems  Constitutional: Positive for fatigue. Negative for activity change, appetite change, chills and unexpected weight change.  HENT: Negative for congestion, mouth sores and sinus pressure.   Eyes: Negative for visual disturbance.  Respiratory: Negative for cough and chest tightness.   Gastrointestinal: Negative for abdominal pain and nausea.  Genitourinary:  Negative for difficulty urinating, frequency and vaginal pain.  Musculoskeletal: Positive for arthralgias. Negative for back pain and gait problem.  Skin: Positive for rash. Negative for pallor.  Neurological: Negative for dizziness, tremors, weakness, numbness and headaches.  Psychiatric/Behavioral: Negative for confusion and sleep disturbance. The patient is nervous/anxious.   extensive rash on back, chest  Objective:  BP 128/68 (BP Location: Left Arm, Patient Position: Sitting, Cuff Size: Normal)   Pulse 71   Temp 98.5 F (36.9 C) (Oral)   Ht 5\' 3"  (1.6 m)   Wt 119 lb (54 kg)   SpO2 98%   BMI 21.08 kg/m   BP Readings from Last 3 Encounters:  05/22/17 128/68  04/20/17 112/64  04/13/17 128/82    Wt Readings from Last 3 Encounters:  05/22/17 119 lb (54 kg)  04/20/17 118 lb 9.6 oz (53.8 kg)  04/13/17 118 lb (53.5 kg)    Physical Exam  Constitutional: She appears well-developed. No distress.  HENT:  Head: Normocephalic.  Right Ear: External ear normal.  Left Ear: External ear normal.  Nose: Nose normal.  Mouth/Throat: Oropharynx is clear and moist.  Eyes: Pupils are equal, round, and reactive to light. Conjunctivae are normal. Right eye exhibits no discharge. Left eye exhibits no discharge.  Neck: Normal range of motion. Neck supple. No JVD present. No tracheal deviation present. No thyromegaly present.  Cardiovascular: Normal rate, regular rhythm and normal heart sounds.   Pulmonary/Chest: No  stridor. No respiratory distress. She has no wheezes.  Abdominal: Soft. Bowel sounds are normal. She exhibits no distension and no mass. There is no tenderness. There is no rebound and no guarding.  Musculoskeletal: She exhibits no edema or tenderness.  Lymphadenopathy:    She has no cervical adenopathy.  Neurological: She displays normal reflexes. No cranial nerve deficit. She exhibits normal muscle tone. Coordination normal.  Skin: No rash noted. No erythema.  Psychiatric: She  has a normal mood and affect. Her behavior is normal. Judgment and thought content normal.  eryth papules, excoriations on back, chest  Lab Results  Component Value Date   WBC 6.7 04/13/2017   HGB 13.9 04/13/2017   HCT 41.4 04/13/2017   PLT 294.0 04/13/2017   GLUCOSE 88 04/13/2017   CHOL 130 11/04/2015   TRIG 77.0 11/04/2015   HDL 54.50 11/04/2015   LDLCALC 60 11/04/2015   ALT 12 04/13/2017   AST 22 04/13/2017   NA 141 04/13/2017   K 3.5 04/13/2017   CL 98 04/13/2017   CREATININE 1.30 (H) 04/13/2017   BUN 23 04/13/2017   CO2 34 (H) 04/13/2017   TSH 7.68 (H) 04/13/2017   INR 0.95 10/25/2011   HGBA1C 5.8 02/06/2014   MICROALBUR 0.6 03/23/2011    No results found.  Assessment & Plan:   There are no diagnoses linked to this encounter. I am having Mia Contreras maintain her Calcium Carbonate-Vitamin D, Fluticasone-Salmeterol, vitamin C, Acetaminophen (TYLENOL ARTHRITIS PAIN PO), triamcinolone ointment, estradiol, ALPRAZolam, torsemide, apixaban, and methimazole.  No orders of the defined types were placed in this encounter.    Follow-up: No Follow-up on file.  Walker Kehr, MD

## 2017-05-28 ENCOUNTER — Ambulatory Visit: Payer: PRIVATE HEALTH INSURANCE | Admitting: Internal Medicine

## 2017-05-28 DIAGNOSIS — L820 Inflamed seborrheic keratosis: Secondary | ICD-10-CM | POA: Diagnosis not present

## 2017-05-28 DIAGNOSIS — L400 Psoriasis vulgaris: Secondary | ICD-10-CM | POA: Diagnosis not present

## 2017-05-28 DIAGNOSIS — L821 Other seborrheic keratosis: Secondary | ICD-10-CM | POA: Diagnosis not present

## 2017-06-19 ENCOUNTER — Ambulatory Visit (INDEPENDENT_AMBULATORY_CARE_PROVIDER_SITE_OTHER): Payer: Medicare Other | Admitting: Endocrinology

## 2017-06-19 ENCOUNTER — Encounter: Payer: Self-pay | Admitting: Endocrinology

## 2017-06-19 VITALS — BP 138/66 | HR 87 | Wt 115.6 lb

## 2017-06-19 DIAGNOSIS — E0520 Thyrotoxicosis with toxic multinodular goiter without thyrotoxic crisis or storm: Secondary | ICD-10-CM

## 2017-06-19 LAB — TSH: TSH: 4.24 u[IU]/mL (ref 0.35–4.50)

## 2017-06-19 LAB — T4, FREE: Free T4: 0.62 ng/dL (ref 0.60–1.60)

## 2017-06-19 NOTE — Patient Instructions (Addendum)
blood tests are requested for you today.  We'll let you know about the results. If ever you have fever while taking propylthiouracil, stop it and call us, even if the reason is obvious, because of the risk of a rare side-effect.   Please come back for a follow-up appointment in 3 months.

## 2017-06-19 NOTE — Progress Notes (Signed)
Subjective:    Patient ID: Mia Contreras, female    DOB: Apr 24, 1929, 83 y.o.   MRN: [id number]  HPI Pt returns for f/u of hyperthyroidism (due to large multinodular goiter; dx'ed 2008; bx then showed HYPERPLASTICNODULE. in 2011, pt had RAI for hyperthyroidism; she became euthyroid, last [name] in early 2015 was unchanged; in 2017, hyperthyroidism recurred; plan was for repeat RAI, but RAI uptake was low; pt says she does not take any iodine-containing products, so she was rx'ed tapazole; this was changed to PTU, due to a rash).  pt states she feels well in general, except for fatigue.   Past Medical History:  Diagnosis Date  . AKI (acute kidney injury) ([place] [facility]) 01/2016  . ANEMIA-NOS 08/20/2007  . ANXIETY 03/16/2007  . BREAST CANCER, HX OF 03/16/2007  . CARPAL TUNNEL SYNDROME, RIGHT 01/11/2008  . Cataract    floaters  . CELLULITIS, LEG, RIGHT 08/20/2007  . Collagenous colitis   . COPD (chronic obstructive pulmonary disease) ([place])   . DIVERTICULOSIS, COLON 08/20/2007  . GOITER, MULTINODULAR 05/06/2010   Dr [name]  . HYPERLIPIDEMIA 08/20/2007  . HYPERTENSION 03/16/2007  . HYPERTHYROIDISM 05/23/2010  . IBS (irritable bowel syndrome)   . OSTEOARTHRITIS 03/16/2007  . OSTEOPOROSIS 08/20/2007  . Pneumonia 07/2011   took abx for several weeks  . PREMATURE ATRIAL CONTRACTIONS 09/21/2010  . SCOLIOSIS 01/11/2008  . SKIN CANCER, HX OF 11/12/2008    R cheek 2010, L Cheek 2011 Dr. Tonia Brooms  . Thyroid nodule   . VENOUS INSUFFICIENCY 09/05/2007    Past Surgical History:  Procedure Laterality Date  . ABDOMINAL HYSTERECTOMY    . APPENDECTOMY  1972  . BREAST LUMPECTOMY Left 2001  . CARPAL TUNNEL RELEASE    . CATARACT EXTRACTION    . EYE SURGERY     cataract ext  . HERNIA REPAIR    . MASTECTOMY, PARTIAL Left   . SKIN CANCER EXCISION     face-Dr. Bing Plume  . TOTAL KNEE ARTHROPLASTY    . TOTAL KNEE ARTHROPLASTY Right 2010   Dr Para March  . TOTAL KNEE ARTHROPLASTY  10/30/2011   Procedure: TOTAL KNEE  ARTHROPLASTY;  Surgeon: Lorn Junes, MD;  Location: [facility];  Service: Orthopedics;  Laterality: Left;     . XRT     chemo, surgery    Social History   Social History  . Marital status: Single    Spouse name: N/A  . Number of children: N/A  . Years of education: N/A   Occupational History  . Not on file.   Social History Main Topics  . Smoking status: Former Smoker    Quit date: 12/20/1980  . Smokeless tobacco: Never Used  . Alcohol use Yes     Comment: socially  . Drug use: No  . Sexual activity: Not Currently   Other Topics Concern  . Not on file   Social History Narrative  . No narrative on file    Current Outpatient Prescriptions on File Prior to Visit  Medication Sig Dispense Refill  . Acetaminophen (TYLENOL ARTHRITIS PAIN PO) Take 1 tablet by mouth daily as needed (pain).    Marland Kitchen ALPRAZolam (XANAX) 0.5 MG tablet Take 1 tablet (0.5 mg total) by mouth 2 (two) times daily as needed for anxiety. 60 tablet 2  . apixaban (ELIQUIS) 2.5 MG TABS tablet Take 1 tablet (2.5 mg total) by mouth 2 (two) times daily. 180 tablet 3  . Calcium Carbonate-Vitamin D (CALCIUM 600+D) 600-400 MG-UNIT per tablet Take 2 tablets by  mouth daily.     Marland Kitchen estradiol (ESTRACE) 0.1 MG/GM vaginal cream Place 1 Applicatorful vaginally 3 (three) times a week. 42.5 g 12  . Fluticasone-Salmeterol (ADVAIR DISKUS) 250-50 MCG/DOSE AEPB Inhale 1 puff into the lungs 2 (two) times daily. 60 each 3  . propylthiouracil (PTU) 50 MG tablet Take 2 tablets (100 mg total) by mouth 2 (two) times daily. 120 tablet 5  . torsemide (DEMADEX) 20 MG tablet Take 2 tablets (40 mg total) by mouth daily. 180 tablet 6  . vitamin C (ASCORBIC ACID) 500 MG tablet Take 1 tablet (500 mg total) by mouth daily. 100 tablet 3   No current facility-administered medications on file prior to visit.     Allergies  Allergen Reactions  . Cefuroxime Diarrhea  . Celecoxib Nausea Only  . Deltasone [Prednisone]     Oral deltasone is causing  swelling (can use Depo-Medrol)  . Ranitidine     bloating  . Spironolactone     High K  . Tapazole [Methimazole]     rash  . Tramadol Hcl Nausea Only    Family History  Problem Relation Age of Onset  . Dementia Mother   . Heart disease Mother   . Mental retardation Mother   . Hypertension Mother   . Alzheimer's disease Mother   . Heart attack Father   . Alcohol abuse Father   . Prostate cancer Brother   . Diabetes Other   . Colon cancer Neg Hx   . Anesthesia problems Neg Hx     BP 138/66   Pulse 87   Wt 115 lb 9.6 oz (52.4 kg)   SpO2 96%   BMI 20.48 kg/m    Review of Systems Denies fever    Objective:   Physical Exam VITAL SIGNS:  See vs page GENERAL: no distress Neck: large multinodular goiter (L>>R) is again noted.  Neuro: coarse tremor of the hands.   Skin: not diaphoretic.      Assessment & Plan:  Hyperthyroidism: due for recheck.    Patient Instructions  blood tests are requested for you today.  We'll let you know about the results. If ever you have fever while taking propylthiouracil, stop it and call us, even if the reason is obvious, because of the risk of a rare side-effect.   Please come back for a follow-up appointment in 3 months.

## 2017-06-21 ENCOUNTER — Ambulatory Visit (INDEPENDENT_AMBULATORY_CARE_PROVIDER_SITE_OTHER): Payer: Medicare Other | Admitting: Internal Medicine

## 2017-06-21 ENCOUNTER — Encounter: Payer: Self-pay | Admitting: Internal Medicine

## 2017-06-21 DIAGNOSIS — R110 Nausea: Secondary | ICD-10-CM

## 2017-06-21 DIAGNOSIS — R21 Rash and other nonspecific skin eruption: Secondary | ICD-10-CM

## 2017-06-21 DIAGNOSIS — R600 Localized edema: Secondary | ICD-10-CM

## 2017-06-21 DIAGNOSIS — I481 Persistent atrial fibrillation: Secondary | ICD-10-CM | POA: Diagnosis not present

## 2017-06-21 DIAGNOSIS — N184 Chronic kidney disease, stage 4 (severe): Secondary | ICD-10-CM | POA: Diagnosis not present

## 2017-06-21 DIAGNOSIS — M791 Myalgia, unspecified site: Secondary | ICD-10-CM | POA: Diagnosis not present

## 2017-06-21 DIAGNOSIS — I4819 Other persistent atrial fibrillation: Secondary | ICD-10-CM

## 2017-06-21 NOTE — Assessment & Plan Note (Signed)
Resolved

## 2017-06-21 NOTE — Assessment & Plan Note (Signed)
Resolved off Tapazole

## 2017-06-21 NOTE — Assessment & Plan Note (Signed)
Recurrent Zofran offered OK to try OTC Zantac

## 2017-06-21 NOTE — Patient Instructions (Signed)
Try Zantac OTC 75 mg daily to prevent nausea  Wt Readings from Last 3 Encounters:  06/21/17 117 lb (53.1 kg)  06/19/17 115 lb 9.6 oz (52.4 kg)  05/22/17 119 lb (54 kg)

## 2017-06-21 NOTE — Assessment & Plan Note (Signed)
Elevated CK Monitoring

## 2017-06-21 NOTE — Progress Notes (Signed)
Subjective:  Patient ID: Mia Contreras, female    DOB: 1929-01-06  Age: 83 y.o. MRN: [id number]  CC: No chief complaint on file.   HPI Mia Contreras presents for rash - resolved off Tapazole, CHF, hyperthyroid condition - f/u F/u anxiety... C/o nausea - rare, fatigue  Outpatient Medications Prior to Visit  Medication Sig Dispense Refill  . Acetaminophen (TYLENOL ARTHRITIS PAIN PO) Take 1 tablet by mouth daily as needed (pain).    Marland Kitchen ALPRAZolam (XANAX) 0.5 MG tablet Take 1 tablet (0.5 mg total) by mouth 2 (two) times daily as needed for anxiety. 60 tablet 2  . apixaban (ELIQUIS) 2.5 MG TABS tablet Take 1 tablet (2.5 mg total) by mouth 2 (two) times daily. 180 tablet 3  . betamethasone dipropionate (DIPROLENE) 0.05 % cream Apply topically daily.    . Calcium Carbonate-Vitamin D (CALCIUM 600+D) 600-400 MG-UNIT per tablet Take 2 tablets by mouth daily.     Marland Kitchen estradiol (ESTRACE) 0.1 MG/GM vaginal cream Place 1 Applicatorful vaginally 3 (three) times a week. 42.5 g 12  . Fluticasone-Salmeterol (ADVAIR DISKUS) 250-50 MCG/DOSE AEPB Inhale 1 puff into the lungs 2 (two) times daily. 60 each 3  . propylthiouracil (PTU) 50 MG tablet Take 2 tablets (100 mg total) by mouth 2 (two) times daily. 120 tablet 5  . torsemide (DEMADEX) 20 MG tablet Take 2 tablets (40 mg total) by mouth daily. 180 tablet 6  . vitamin C (ASCORBIC ACID) 500 MG tablet Take 1 tablet (500 mg total) by mouth daily. 100 tablet 3   No facility-administered medications prior to visit.     ROS Review of Systems  Constitutional: Positive for fever. Negative for activity change, appetite change, chills, fatigue and unexpected weight change.  HENT: Negative for congestion, mouth sores and sinus pressure.   Eyes: Negative for visual disturbance.  Respiratory: Negative for cough and chest tightness.   Gastrointestinal: Positive for nausea. Negative for abdominal pain.  Genitourinary: Negative for difficulty urinating, frequency  and vaginal pain.  Musculoskeletal: Negative for back pain and gait problem.  Skin: Negative for pallor and rash.  Neurological: Negative for dizziness, tremors, weakness, numbness and headaches.  Psychiatric/Behavioral: Negative for confusion and sleep disturbance.    Objective:  BP 130/72 (BP Location: Right Arm, Patient Position: Sitting, Cuff Size: Normal)   Pulse 66   Temp 98.1 F (36.7 C) (Oral)   Ht 5\' 3"  (1.6 m)   Wt 117 lb (53.1 kg)   SpO2 94%   BMI 20.73 kg/m   BP Readings from Last 3 Encounters:  06/21/17 130/72  06/19/17 138/66  05/22/17 128/68    Wt Readings from Last 3 Encounters:  06/21/17 117 lb (53.1 kg)  06/19/17 115 lb 9.6 oz (52.4 kg)  05/22/17 119 lb (54 kg)    Physical Exam  Constitutional: She appears well-developed. No distress.  HENT:  Head: Normocephalic.  Right Ear: External ear normal.  Left Ear: External ear normal.  Nose: Nose normal.  Mouth/Throat: Oropharynx is clear and moist.  Eyes: Pupils are equal, round, and reactive to light. Conjunctivae are normal. Right eye exhibits no discharge. Left eye exhibits no discharge.  Neck: Normal range of motion. Neck supple. No JVD present. No tracheal deviation present. No thyromegaly present.  Cardiovascular: Normal rate, regular rhythm and normal heart sounds.   Pulmonary/Chest: No stridor. No respiratory distress. She has no wheezes.  Abdominal: Soft. Bowel sounds are normal. She exhibits no distension and no mass. There is no tenderness. There is  no rebound and no guarding.  Musculoskeletal: She exhibits no edema or tenderness.  Lymphadenopathy:    She has no cervical adenopathy.  Neurological: She displays normal reflexes. No cranial nerve deficit. She exhibits normal muscle tone. Coordination normal.  Skin: No rash noted. No erythema.  Psychiatric: She has a normal mood and affect. Her behavior is normal. Judgment and thought content normal.    Lab Results  Component Value Date   WBC 6.7  04/13/2017   HGB 13.9 04/13/2017   HCT 41.4 04/13/2017   PLT 294.0 04/13/2017   GLUCOSE 88 04/13/2017   CHOL 130 11/04/2015   TRIG 77.0 11/04/2015   HDL 54.50 11/04/2015   LDLCALC 60 11/04/2015   ALT 12 04/13/2017   AST 22 04/13/2017   NA 141 04/13/2017   K 3.5 04/13/2017   CL 98 04/13/2017   CREATININE 1.30 (H) 04/13/2017   BUN 23 04/13/2017   CO2 34 (H) 04/13/2017   TSH 4.24 06/19/2017   INR 0.95 10/25/2011   HGBA1C 5.8 02/06/2014   MICROALBUR 0.6 03/23/2011    No results found.  Assessment & Plan:   There are no diagnoses linked to this encounter. I am having Ms. Magallon maintain her Calcium Carbonate-Vitamin D, Fluticasone-Salmeterol, vitamin C, Acetaminophen (TYLENOL ARTHRITIS PAIN PO), estradiol, torsemide, apixaban, ALPRAZolam, propylthiouracil, and betamethasone dipropionate.  No orders of the defined types were placed in this encounter.    Follow-up: No Follow-up on file.  Walker Kehr, MD

## 2017-06-21 NOTE — Assessment & Plan Note (Signed)
Monitor labs 

## 2017-06-21 NOTE — Assessment & Plan Note (Signed)
On Eliquis

## 2017-07-16 NOTE — Progress Notes (Signed)
Patient ID: Mia Contreras, female   DOB: Sep 06, 1928, 83 y.o.   MRN: [id number]      Referring Physician: Dr Johnsie Cancel Primary Care: Arlana Lindau, MD Primary Cardiologist: Dr Johnsie Cancel Primary HF: Dr. Haroldine Laws  HPI: Mia Contreras is a 83 y.o. female with a history of nonischemic cardiomyopathy, systolic CHF EF 40-98% in 2016 (echo 45-50% in 11/2015), PACs, L Breast cancer in 2000 with surgery and chemo.  Admitted 4/25 -12/19/15 with A/C combined CHF and AKI.  Discharge weight 134 lbs.   Admitted 6/7 -> 02/07/16 with volume overload. Diuretic changed to deedex  Discharge weight 125 lbs.   Was admitted  (10/17) with severe symptomatic hyperkalemia (K > 7.5) and ARF (Creatinine 5.3). Was initially bradycardic with HRs in 20s. Spiro and potassium stopped. Treated with fluids and bicarb and recovered. On d/c 10/22 K 4.4 Cr 2.2 Discussed Palliative Care  B-blocker stopped.    Went to  [facility] on d/c . Dagnosed with LUE clot at site of IV and Eliquis started. Weight stable. Has restarted PT and now doing exercise bike. Home now    11/2015 ECHO EF 45-50% reviewed   She is frustrated with Rx for hyperthyroidism had rash with tapazole and now on PTU but it makes her Mouth and skin dry    FH: Brother also has CAD and afib, Mother had CABG but did well, Father passed away from MI at age 15 thought to be 2/2 to heavy ETOH use.  SH:  Pt has a distant history of smoking. Pt has 50-60 pack years up to 2ppd. Quit in 1982.   Past Medical History:  Diagnosis Date  . AKI (acute kidney injury) ([place]) 01/2016  . ANEMIA-NOS 08/20/2007  . ANXIETY 03/16/2007  . BREAST CANCER, HX OF 03/16/2007  . CARPAL TUNNEL SYNDROME, RIGHT 01/11/2008  . Cataract    floaters  . CELLULITIS, LEG, RIGHT 08/20/2007  . Collagenous colitis   . COPD (chronic obstructive pulmonary disease) ([place])   . DIVERTICULOSIS, COLON 08/20/2007  . GOITER, MULTINODULAR 05/06/2010   Dr [name]  . HYPERLIPIDEMIA 08/20/2007  .  HYPERTENSION 03/16/2007  . HYPERTHYROIDISM 05/23/2010  . IBS (irritable bowel syndrome)   . OSTEOARTHRITIS 03/16/2007  . OSTEOPOROSIS 08/20/2007  . Pneumonia 07/2011   took abx for several weeks  . PREMATURE ATRIAL CONTRACTIONS 09/21/2010  . SCOLIOSIS 01/11/2008  . SKIN CANCER, HX OF 11/12/2008    R cheek 2010, L Cheek 2011 Dr. Tonia Brooms  . Thyroid nodule   . VENOUS INSUFFICIENCY 09/05/2007    Current Outpatient Medications  Medication Sig Dispense Refill  . Acetaminophen (TYLENOL ARTHRITIS PAIN PO) Take 1 tablet by mouth daily as needed (pain).    Marland Kitchen ALPRAZolam (XANAX) 0.5 MG tablet Take 1 tablet (0.5 mg total) by mouth 2 (two) times daily as needed for anxiety. 60 tablet 2  . apixaban (ELIQUIS) 2.5 MG TABS tablet Take 1 tablet (2.5 mg total) by mouth 2 (two) times daily. 180 tablet 3  . betamethasone dipropionate (DIPROLENE) 0.05 % cream Apply topically daily.    . Calcium Carbonate-Vitamin D (CALCIUM 600+D) 600-400 MG-UNIT per tablet Take 2 tablets by mouth daily.     Marland Kitchen estradiol (ESTRACE) 0.1 MG/GM vaginal cream Place 1 Applicatorful vaginally 3 (three) times a week. 42.5 g 12  . Fluticasone-Salmeterol (ADVAIR DISKUS) 250-50 MCG/DOSE AEPB Inhale 1 puff into the lungs 2 (two) times daily. 60 each 3  . propylthiouracil (PTU) 50 MG tablet Take 2 tablets (100 mg total) by mouth 2 (two) times  daily. 120 tablet 5  . torsemide (DEMADEX) 20 MG tablet Take 2 tablets (40 mg total) by mouth daily. 180 tablet 6  . vitamin C (ASCORBIC ACID) 500 MG tablet Take 1 tablet (500 mg total) by mouth daily. 100 tablet 3   No current facility-administered medications for this visit.     Allergies  Allergen Reactions  . Cefuroxime Diarrhea  . Celecoxib Nausea Only  . Deltasone [Prednisone]     Oral deltasone is causing swelling (can use Depo-Medrol)  . Ranitidine     bloating  . Spironolactone     High K  . Tapazole [Methimazole]     rash  . Tramadol Hcl Nausea Only      Social History    Socioeconomic History  . Marital status: Single    Spouse name: Not on file  . Number of children: Not on file  . Years of education: Not on file  . Highest education level: Not on file  Social Needs  . Financial resource strain: Not on file  . Food insecurity - worry: Not on file  . Food insecurity - inability: Not on file  . Transportation needs - medical: Not on file  . Transportation needs - non-medical: Not on file  Occupational History  . Not on file  Tobacco Use  . Smoking status: Former Smoker    Last attempt to quit: 12/20/1980    Years since quitting: 36.6  . Smokeless tobacco: Never Used  Substance and Sexual Activity  . Alcohol use: Yes    Comment: socially  . Drug use: No  . Sexual activity: Not Currently  Other Topics Concern  . Not on file  Social History Narrative  . Not on file      Family History  Problem Relation Age of Onset  . Dementia Mother   . Heart disease Mother   . Mental retardation Mother   . Hypertension Mother   . Alzheimer's disease Mother   . Heart attack Father   . Alcohol abuse Father   . Prostate cancer Brother   . Diabetes Other   . Colon cancer Neg Hx   . Anesthesia problems Neg Hx     Vitals:   07/19/17 1031  BP: 130/76  Pulse: 76  SpO2: 97%  Weight: 117 lb 1.6 oz (53.1 kg)  Height: 5\' 3"  (1.6 m)   Wt Readings from Last 3 Encounters:  07/19/17 117 lb 1.6 oz (53.1 kg)  06/21/17 117 lb (53.1 kg)  06/19/17 115 lb 9.6 oz (52.4 kg)    ECG SR rate 70 LAFB poor R wave progression  PHYSICAL EXAM: BP 130/76   Pulse 76   Ht 5\' 3"  (1.6 m)   Wt 117 lb 1.6 oz (53.1 kg) Comment: REPORTED AT HOME READING  SpO2 97%   BMI 20.74 kg/m  Affect appropriate Affect appropriate Frail elderly female  HEENT: normal Neck supple with no adenopathy JVP normal no bruits no thyromegaly Lungs clear with no wheezing and good diaphragmatic motion Heart:  S1/S2 no murmur, no rub, gallop or click PMI normal Abdomen: benighn, BS  positve, no tenderness, no AAA no bruit.  No HSM or HJR Distal pulses intact with no bruits No edema Neuro non-focal Skin warm and dry No muscular weakness    ASSESSMENT & PLAN: 1. Chronic diastolic HF with R>>L EF 41-28% continue current meds  2. AKI on CKD stage III last Cr 04/13/17 1.3 varies with level of diuresis  3. LE wounds- Followed at  the wound center.  4.  H/o COPD 5. H/o breast cancer s/p chemo 2000 may contribute to DCM 6. Chronic A fib- Rate controlled. On Eliquis twice a day.  CHA2DS2-VASc Score is 6. Low dose due to age/Cr 7. Falls- PT/OT now home  8. LUE DVT see above anticoagulated  9. Thyroid f/u [name] maybe she could get more radioactive iodine She indicates last time dose was delyed In getting to hospital and probably not effective.    Code status DNR/DNI per Dr Dorothe Pea Addilyn Satterwhite,MD 10:53 AM

## 2017-07-19 ENCOUNTER — Ambulatory Visit (INDEPENDENT_AMBULATORY_CARE_PROVIDER_SITE_OTHER): Payer: Medicare Other | Admitting: Cardiovascular Disease

## 2017-07-19 ENCOUNTER — Encounter: Payer: Self-pay | Admitting: Cardiovascular Disease

## 2017-07-19 VITALS — BP 130/76 | HR 76 | Ht 63.0 in | Wt 117.1 lb

## 2017-07-19 DIAGNOSIS — I5032 Chronic diastolic (congestive) heart failure: Secondary | ICD-10-CM

## 2017-07-19 DIAGNOSIS — I482 Chronic atrial fibrillation, unspecified: Secondary | ICD-10-CM

## 2017-07-19 NOTE — Patient Instructions (Signed)

## 2017-07-26 ENCOUNTER — Telehealth: Payer: Self-pay | Admitting: Endocrinology

## 2017-07-26 DIAGNOSIS — E0520 Thyrotoxicosis with toxic multinodular goiter without thyrotoxic crisis or storm: Secondary | ICD-10-CM

## 2017-07-26 NOTE — Telephone Encounter (Signed)
Mia Contreras patient

## 2017-07-26 NOTE — Telephone Encounter (Signed)
Appointment made for tomorrow afternoon.

## 2017-07-26 NOTE — Telephone Encounter (Addendum)
Pt called states since starting new dosage thyroid medication she has uncomfortable symptom. Tongue coated & dry, skin dry, finger tips flake, scalp flaky, dont feel normal at all says sluggish as well, dry brittle flaky nails. Drinking constantly. Pt says EKG good this week at appt. Pt says weight 116 so healthy and pt says no fever. Please call pt advise on pt cell phone number.

## 2017-07-26 NOTE — Telephone Encounter (Signed)
Please come in for labs.

## 2017-07-26 NOTE — Telephone Encounter (Signed)
Please advise on below  

## 2017-07-26 NOTE — Telephone Encounter (Signed)
Please advise 

## 2017-07-27 ENCOUNTER — Other Ambulatory Visit (INDEPENDENT_AMBULATORY_CARE_PROVIDER_SITE_OTHER): Payer: Medicare Other

## 2017-07-27 ENCOUNTER — Other Ambulatory Visit: Payer: Self-pay | Admitting: Endocrinology

## 2017-07-27 DIAGNOSIS — E0520 Thyrotoxicosis with toxic multinodular goiter without thyrotoxic crisis or storm: Secondary | ICD-10-CM

## 2017-07-27 LAB — CBC WITH DIFFERENTIAL/PLATELET
BASOS ABS: 0.1 10*3/uL (ref 0.0–0.1)
Basophils Relative: 1.1 % (ref 0.0–3.0)
EOS PCT: 4.9 % (ref 0.0–5.0)
Eosinophils Absolute: 0.4 10*3/uL (ref 0.0–0.7)
HEMATOCRIT: 37.7 % (ref 36.0–46.0)
HEMOGLOBIN: 12.7 g/dL (ref 12.0–15.0)
LYMPHS ABS: 1.1 10*3/uL (ref 0.7–4.0)
LYMPHS PCT: 15 % (ref 12.0–46.0)
MCHC: 33.6 g/dL (ref 30.0–36.0)
MCV: 96.4 fl (ref 78.0–100.0)
Monocytes Absolute: 1 10*3/uL (ref 0.1–1.0)
Monocytes Relative: 13.8 % — ABNORMAL HIGH (ref 3.0–12.0)
Neutro Abs: 4.8 10*3/uL (ref 1.4–7.7)
Neutrophils Relative %: 65.2 % (ref 43.0–77.0)
Platelets: 259 10*3/uL (ref 150.0–400.0)
RBC: 3.91 Mil/uL (ref 3.87–5.11)
RDW: 15.2 % (ref 11.5–15.5)
WBC: 7.4 10*3/uL (ref 4.0–10.5)

## 2017-07-27 LAB — TSH: TSH: 4.83 u[IU]/mL — ABNORMAL HIGH (ref 0.35–4.50)

## 2017-07-27 LAB — T4, FREE: Free T4: 0.55 ng/dL — ABNORMAL LOW (ref 0.60–1.60)

## 2017-07-27 MED ORDER — PROPYLTHIOURACIL 50 MG PO TABS: 50.0000 mg | ORAL_TABLET | Freq: Three times a day (TID) | ORAL | 5 refills | Status: DC

## 2017-08-20 ENCOUNTER — Other Ambulatory Visit (INDEPENDENT_AMBULATORY_CARE_PROVIDER_SITE_OTHER): Payer: Medicare Other

## 2017-08-20 DIAGNOSIS — R600 Localized edema: Secondary | ICD-10-CM

## 2017-08-20 DIAGNOSIS — R110 Nausea: Secondary | ICD-10-CM

## 2017-08-20 DIAGNOSIS — I481 Persistent atrial fibrillation: Secondary | ICD-10-CM

## 2017-08-20 DIAGNOSIS — N184 Chronic kidney disease, stage 4 (severe): Secondary | ICD-10-CM

## 2017-08-20 DIAGNOSIS — M791 Myalgia, unspecified site: Secondary | ICD-10-CM

## 2017-08-20 DIAGNOSIS — I4819 Other persistent atrial fibrillation: Secondary | ICD-10-CM

## 2017-08-20 LAB — BASIC METABOLIC PANEL
BUN: 19 mg/dL (ref 6–23)
CALCIUM: 9.2 mg/dL (ref 8.4–10.5)
CO2: 30 mEq/L (ref 19–32)
CREATININE: 1.36 mg/dL — AB (ref 0.40–1.20)
Chloride: 101 mEq/L (ref 96–112)
GFR: 39.17 mL/min — AB (ref >60.00)
Glucose, Bld: 114 mg/dL — ABNORMAL HIGH (ref 70–99)
Potassium: 3.4 mEq/L — ABNORMAL LOW (ref 3.5–5.1)
Sodium: 141 mEq/L (ref 135–145)

## 2017-08-20 LAB — CBC WITH DIFFERENTIAL/PLATELET
BASOS PCT: 1.1 % (ref 0.0–3.0)
Basophils Absolute: 0.1 10*3/uL (ref 0.0–0.1)
EOS ABS: 0.6 10*3/uL (ref 0.0–0.7)
Eosinophils Relative: 8.5 % — ABNORMAL HIGH (ref 0.0–5.0)
HEMATOCRIT: 38.6 % (ref 36.0–46.0)
Hemoglobin: 13 g/dL (ref 12.0–15.0)
LYMPHS PCT: 26.8 % (ref 12.0–46.0)
Lymphs Abs: 1.8 10*3/uL (ref 0.7–4.0)
MCHC: 33.7 g/dL (ref 30.0–36.0)
MCV: 95.9 fl (ref 78.0–100.0)
MONOS PCT: 13.5 % — AB (ref 3.0–12.0)
Monocytes Absolute: 0.9 10*3/uL (ref 0.1–1.0)
NEUTROS ABS: 3.4 10*3/uL (ref 1.4–7.7)
Neutrophils Relative %: 50.1 % (ref 43.0–77.0)
PLATELETS: 246 10*3/uL (ref 150.0–400.0)
RBC: 4.03 Mil/uL (ref 3.87–5.11)
RDW: 14.4 % (ref 11.5–15.5)
WBC: 6.8 10*3/uL (ref 4.0–10.5)

## 2017-08-20 LAB — HEPATIC FUNCTION PANEL
ALT: 118 U/L — AB (ref 0–35)
AST: 80 U/L — ABNORMAL HIGH (ref 0–37)
Albumin: 4.4 g/dL (ref 3.5–5.2)
Alkaline Phosphatase: 69 U/L (ref 39–117)
BILIRUBIN DIRECT: 0.1 mg/dL (ref 0.0–0.3)
BILIRUBIN TOTAL: 0.9 mg/dL (ref 0.2–1.2)
Total Protein: 7.1 g/dL (ref 6.0–8.3)

## 2017-08-20 LAB — TSH: TSH: 6.41 u[IU]/mL — ABNORMAL HIGH (ref 0.35–4.50)

## 2017-08-20 LAB — CK: Total CK: 225 U/L — ABNORMAL HIGH (ref 7–177)

## 2017-08-24 ENCOUNTER — Encounter: Payer: Self-pay | Admitting: Internal Medicine

## 2017-08-24 ENCOUNTER — Ambulatory Visit (INDEPENDENT_AMBULATORY_CARE_PROVIDER_SITE_OTHER): Payer: Medicare Other | Admitting: Internal Medicine

## 2017-08-24 ENCOUNTER — Other Ambulatory Visit (INDEPENDENT_AMBULATORY_CARE_PROVIDER_SITE_OTHER): Payer: Medicare Other

## 2017-08-24 DIAGNOSIS — S8011XA Contusion of right lower leg, initial encounter: Secondary | ICD-10-CM

## 2017-08-24 DIAGNOSIS — I480 Paroxysmal atrial fibrillation: Secondary | ICD-10-CM | POA: Diagnosis not present

## 2017-08-24 DIAGNOSIS — E0520 Thyrotoxicosis with toxic multinodular goiter without thyrotoxic crisis or storm: Secondary | ICD-10-CM

## 2017-08-24 DIAGNOSIS — S8010XA Contusion of unspecified lower leg, initial encounter: Secondary | ICD-10-CM | POA: Insufficient documentation

## 2017-08-24 LAB — BASIC METABOLIC PANEL
BUN: 25 mg/dL — ABNORMAL HIGH (ref 6–23)
CHLORIDE: 97 meq/L (ref 96–112)
CO2: 34 meq/L — AB (ref 19–32)
CREATININE: 1.51 mg/dL — AB (ref 0.40–1.20)
Calcium: 10.3 mg/dL (ref 8.4–10.5)
GFR: 34.71 mL/min — ABNORMAL LOW (ref >60.00)
Glucose, Bld: 96 mg/dL (ref 70–99)
Potassium: 3.5 mEq/L (ref 3.5–5.1)
SODIUM: 141 meq/L (ref 135–145)

## 2017-08-24 LAB — CBC WITH DIFFERENTIAL/PLATELET
BASOS ABS: 0.1 10*3/uL (ref 0.0–0.1)
Basophils Relative: 1.3 % (ref 0.0–3.0)
Eosinophils Absolute: 0.4 10*3/uL (ref 0.0–0.7)
Eosinophils Relative: 4.3 % (ref 0.0–5.0)
HCT: 40.2 % (ref 36.0–46.0)
HEMOGLOBIN: 13.4 g/dL (ref 12.0–15.0)
LYMPHS ABS: 1.3 10*3/uL (ref 0.7–4.0)
Lymphocytes Relative: 14.8 % (ref 12.0–46.0)
MCHC: 33.3 g/dL (ref 30.0–36.0)
MCV: 96.3 fl (ref 78.0–100.0)
Monocytes Absolute: 1.2 10*3/uL — ABNORMAL HIGH (ref 0.1–1.0)
Monocytes Relative: 13.5 % — ABNORMAL HIGH (ref 3.0–12.0)
NEUTROS PCT: 66.1 % (ref 43.0–77.0)
Neutro Abs: 5.7 10*3/uL (ref 1.4–7.7)
Platelets: 285 10*3/uL (ref 150.0–400.0)
RBC: 4.17 Mil/uL (ref 3.87–5.11)
RDW: 14.1 % (ref 11.5–15.5)
WBC: 8.6 10*3/uL (ref 4.0–10.5)

## 2017-08-24 LAB — T3, FREE: T3 FREE: 3 pg/mL (ref 2.3–4.2)

## 2017-08-24 LAB — T4, FREE: FREE T4: 0.54 ng/dL — AB (ref 0.60–1.60)

## 2017-08-24 MED ORDER — MUPIROCIN 2 % EX OINT: TOPICAL_OINTMENT | CUTANEOUS | 0 refills | Status: DC

## 2017-08-24 MED ORDER — ALPRAZOLAM 0.5 MG PO TABS: 0.5000 mg | ORAL_TABLET | Freq: Two times a day (BID) | ORAL | 2 refills | Status: DC | PRN

## 2017-08-24 NOTE — Assessment & Plan Note (Signed)
Dr [name] reduced her Synthroid dose

## 2017-08-24 NOTE — Assessment & Plan Note (Signed)
See procedure 

## 2017-08-24 NOTE — Progress Notes (Signed)
Subjective:  Patient ID: Mia Contreras, female    DOB: [birth year]  Age: 84 y.o. MRN: [id number]  CC: No chief complaint on file.   HPI Zaina Jenkin [place]heim presents for a blood blister on the L leg C/o dry skin, hair loss and thin nails F/u thyroid issues - Dr [name] reduced her PTU dose  Outpatient Medications Prior to Visit  Medication Sig Dispense Refill  . Acetaminophen (TYLENOL ARTHRITIS PAIN PO) Take 1 tablet by mouth daily as needed (pain).    Marland Kitchen ALPRAZolam (XANAX) 0.5 MG tablet Take 1 tablet (0.5 mg total) by mouth 2 (two) times daily as needed for anxiety. 60 tablet 2  . apixaban (ELIQUIS) 2.5 MG TABS tablet Take 1 tablet (2.5 mg total) by mouth 2 (two) times daily. 180 tablet 3  . betamethasone dipropionate (DIPROLENE) 0.05 % cream Apply topically daily.    . Calcium Carbonate-Vitamin D (CALCIUM 600+D) 600-400 MG-UNIT per tablet Take 2 tablets by mouth daily.     Marland Kitchen estradiol (ESTRACE) 0.1 MG/GM vaginal cream Place 1 Applicatorful vaginally 3 (three) times a week. 42.5 g 12  . Fluticasone-Salmeterol (ADVAIR DISKUS) 250-50 MCG/DOSE AEPB Inhale 1 puff into the lungs 2 (two) times daily. 60 each 3  . propylthiouracil (PTU) 50 MG tablet Take 1 tablet (50 mg total) by mouth 3 (three) times daily. 90 tablet 5  . torsemide (DEMADEX) 20 MG tablet Take 2 tablets (40 mg total) by mouth daily. 180 tablet 6  . vitamin C (ASCORBIC ACID) 500 MG tablet Take 1 tablet (500 mg total) by mouth daily. 100 tablet 3   No facility-administered medications prior to visit.     ROS Review of Systems  Constitutional: Negative for activity change, appetite change, chills, fatigue and unexpected weight change.  HENT: Negative for congestion, mouth sores and sinus pressure.   Eyes: Negative for visual disturbance.  Respiratory: Negative for cough and chest tightness.   Gastrointestinal: Negative for abdominal pain and nausea.  Genitourinary: Negative for difficulty urinating, frequency and vaginal pain.    Musculoskeletal: Negative for back pain and gait problem.  Skin: Negative for pallor and rash.  Neurological: Negative for dizziness, tremors, weakness, numbness and headaches.  Psychiatric/Behavioral: Negative for confusion and sleep disturbance. The patient is nervous/anxious.     Objective:  BP 122/68 (BP Location: Left Arm, Patient Position: Sitting, Cuff Size: Normal)   Pulse 72   Temp 97.8 F (36.6 C) (Oral)   Ht 5\' 3"  (1.6 m)   Wt 119 lb (54 kg)   SpO2 98%   BMI 21.08 kg/m   BP Readings from Last 3 Encounters:  08/24/17 122/68  07/19/17 130/76  06/21/17 130/72    Wt Readings from Last 3 Encounters:  08/24/17 119 lb (54 kg)  07/19/17 117 lb 1.6 oz (53.1 kg)  06/21/17 117 lb (53.1 kg)    Physical Exam  Constitutional: She appears well-developed. No distress.  HENT:  Head: Normocephalic.  Right Ear: External ear normal.  Left Ear: External ear normal.  Nose: Nose normal.  Mouth/Throat: Oropharynx is clear and moist.  Eyes: Conjunctivae are normal. Pupils are equal, round, and reactive to light. Right eye exhibits no discharge. Left eye exhibits no discharge.  Neck: Normal range of motion. Neck supple. No JVD present. No tracheal deviation present. No thyromegaly present.  Cardiovascular: Normal rate, regular rhythm and normal heart sounds.  Pulmonary/Chest: No stridor. No respiratory distress. She has no wheezes.  Abdominal: Soft. Bowel sounds are normal. She exhibits no distension  and no mass. There is no tenderness. There is no rebound and no guarding.  Musculoskeletal: She exhibits no edema or tenderness.  Lymphadenopathy:    She has no cervical adenopathy.  Neurological: She displays normal reflexes. No cranial nerve deficit. She exhibits normal muscle tone. Coordination normal.  Skin: No rash noted. No erythema.  Psychiatric: She has a normal mood and affect. Her behavior is normal. Judgment and thought content normal.  R knee 2 cm blood  blister   Procedure note:  Incision and Drainage of a superficial hematoma   Indication : a localized collection of blood that is tender and not spontaneously resolving. At risk to get infected    Risks including unsuccessful procedure , possible need for a repeat procedure due to pus accumulation, scar formation, and others as well as benefits were explained to the patient in detail. Written consent was obtained/signed.    The patient was placed in a decubitus position. The area of a hematoma on R knee was prepped with povidone-iodine and draped in a sterile fashion. Blister incised, scab removed. About 1 cc of purulent material was expressed.The cavity was cleaned  The wound was dressed with antibiotic ointment and a large bandaid.  Tolerated well. Complications: None.   Wound instructions provided.    Please contact us if you notice a recollection of pus in the abscess fever and chills increased pain redness red streaks near the abscess increased swelling in the area.     Lab Results  Component Value Date   WBC 6.8 08/20/2017   HGB 13.0 08/20/2017   HCT 38.6 08/20/2017   PLT 246.0 08/20/2017   GLUCOSE 114 (H) 08/20/2017   CHOL 130 11/04/2015   TRIG 77.0 11/04/2015   HDL 54.50 11/04/2015   LDLCALC 60 11/04/2015   ALT 118 (H) 08/20/2017   AST 80 (H) 08/20/2017   NA 141 08/20/2017   K 3.4 (L) 08/20/2017   CL 101 08/20/2017   CREATININE 1.36 (H) 08/20/2017   BUN 19 08/20/2017   CO2 30 08/20/2017   TSH 6.41 (H) 08/20/2017   INR 0.95 10/25/2011   HGBA1C 5.8 02/06/2014   MICROALBUR 0.6 03/23/2011    No results found.  Assessment & Plan:   There are no diagnoses linked to this encounter. I am having Wandra Arthurs. Mcgurk maintain her Calcium Carbonate-Vitamin D, Fluticasone-Salmeterol, vitamin C, Acetaminophen (TYLENOL ARTHRITIS PAIN PO), estradiol, torsemide, apixaban, ALPRAZolam, betamethasone dipropionate, and propylthiouracil.  No orders of the defined types were  placed in this encounter.    Follow-up: No Follow-up on file.  Walker Kehr, MD

## 2017-08-24 NOTE — Assessment & Plan Note (Signed)
Dr [name] reduced her PTU dose Labs

## 2017-08-28 DIAGNOSIS — L219 Seborrheic dermatitis, unspecified: Secondary | ICD-10-CM | POA: Diagnosis not present

## 2017-08-28 DIAGNOSIS — L821 Other seborrheic keratosis: Secondary | ICD-10-CM | POA: Diagnosis not present

## 2017-08-28 DIAGNOSIS — Z411 Encounter for cosmetic surgery: Secondary | ICD-10-CM | POA: Diagnosis not present

## 2017-08-28 DIAGNOSIS — L720 Epidermal cyst: Secondary | ICD-10-CM | POA: Diagnosis not present

## 2017-08-28 DIAGNOSIS — L400 Psoriasis vulgaris: Secondary | ICD-10-CM | POA: Diagnosis not present

## 2017-08-28 DIAGNOSIS — Z23 Encounter for immunization: Secondary | ICD-10-CM | POA: Diagnosis not present

## 2017-08-28 DIAGNOSIS — L570 Actinic keratosis: Secondary | ICD-10-CM | POA: Diagnosis not present

## 2017-08-28 DIAGNOSIS — Z85828 Personal history of other malignant neoplasm of skin: Secondary | ICD-10-CM | POA: Diagnosis not present

## 2017-08-30 ENCOUNTER — Ambulatory Visit (INDEPENDENT_AMBULATORY_CARE_PROVIDER_SITE_OTHER): Payer: Medicare Other | Admitting: Endocrinology

## 2017-08-30 ENCOUNTER — Encounter: Payer: Self-pay | Admitting: Endocrinology

## 2017-08-30 VITALS — BP 118/64 | HR 76 | Wt 120.2 lb

## 2017-08-30 DIAGNOSIS — E0520 Thyrotoxicosis with toxic multinodular goiter without thyrotoxic crisis or storm: Secondary | ICD-10-CM | POA: Diagnosis not present

## 2017-08-30 NOTE — Progress Notes (Signed)
Subjective:    Patient ID: Mia Contreras, female    DOB: 1928/09/19, 84 y.o.   MRN: [id number]  HPI Pt returns for f/u of hyperthyroidism (due to large multinodular goiter; dx'ed 2008; bx then showed HYPERPLASTICNODULE. in 2011, pt had RAI for hyperthyroidism; she became euthyroid, last [name] in early 2015 was unchanged; in 2017, hyperthyroidism recurred; plan was for repeat RAI, but RAI uptake was low; pt says she does not take any iodine-containing products, so she was rx'ed tapazole; this was changed to PTU, due to a rash).  pt states slight itching throughout the body, and assoc dry skin. Past Medical History:  Diagnosis Date  . AKI (acute kidney injury) ([place]) 01/2016  . ANEMIA-NOS 08/20/2007  . ANXIETY 03/16/2007  . BREAST CANCER, HX OF 03/16/2007  . CARPAL TUNNEL SYNDROME, RIGHT 01/11/2008  . Cataract    floaters  . CELLULITIS, LEG, RIGHT 08/20/2007  . Collagenous colitis   . COPD (chronic obstructive pulmonary disease) ([place])   . DIVERTICULOSIS, COLON 08/20/2007  . GOITER, MULTINODULAR 05/06/2010   Dr [name]  . HYPERLIPIDEMIA 08/20/2007  . HYPERTENSION 03/16/2007  . HYPERTHYROIDISM 05/23/2010  . IBS (irritable bowel syndrome)   . OSTEOARTHRITIS 03/16/2007  . OSTEOPOROSIS 08/20/2007  . Pneumonia 07/2011   took abx for several weeks  . PREMATURE ATRIAL CONTRACTIONS 09/21/2010  . SCOLIOSIS 01/11/2008  . SKIN CANCER, HX OF 11/12/2008    R cheek 2010, L Cheek 2011 Dr. Tonia Brooms  . Thyroid nodule   . VENOUS INSUFFICIENCY 09/05/2007    Past Surgical History:  Procedure Laterality Date  . ABDOMINAL HYSTERECTOMY    . APPENDECTOMY  1972  . BREAST LUMPECTOMY Left 2001  . CARPAL TUNNEL RELEASE    . CATARACT EXTRACTION    . EYE SURGERY     cataract ext  . HERNIA REPAIR    . MASTECTOMY, PARTIAL Left   . SKIN CANCER EXCISION     face-Dr. Bing Plume  . TOTAL KNEE ARTHROPLASTY    . TOTAL KNEE ARTHROPLASTY Right 2010   Dr Para March  . TOTAL KNEE ARTHROPLASTY  10/30/2011   Procedure: TOTAL KNEE  ARTHROPLASTY;  Surgeon: Lorn Junes, MD;  Location: [place] [facility];  Service: Orthopedics;  Laterality: Left;     . XRT     chemo, surgery    Social History   Socioeconomic History  . Marital status: Single    Spouse name: Not on file  . Number of children: Not on file  . Years of education: Not on file  . Highest education level: Not on file  Social Needs  . Financial resource strain: Not on file  . Food insecurity - worry: Not on file  . Food insecurity - inability: Not on file  . Transportation needs - medical: Not on file  . Transportation needs - non-medical: Not on file  Occupational History  . Not on file  Tobacco Use  . Smoking status: Former Smoker    Last attempt to quit: 12/20/1980    Years since quitting: 36.7  . Smokeless tobacco: Never Used  Substance and Sexual Activity  . Alcohol use: Yes    Comment: socially  . Drug use: No  . Sexual activity: Not Currently  Other Topics Concern  . Not on file  Social History Narrative  . Not on file    Current Outpatient Medications on File Prior to Visit  Medication Sig Dispense Refill  . Acetaminophen (TYLENOL ARTHRITIS PAIN PO) Take 1 tablet by mouth daily as needed (pain).    Marland Kitchen  ALPRAZolam (XANAX) 0.5 MG tablet Take 1 tablet (0.5 mg total) by mouth 2 (two) times daily as needed for anxiety. 60 tablet 2  . apixaban (ELIQUIS) 2.5 MG TABS tablet Take 1 tablet (2.5 mg total) by mouth 2 (two) times daily. 180 tablet 3  . betamethasone dipropionate (DIPROLENE) 0.05 % cream Apply topically daily.    . Calcium Carbonate-Vitamin D (CALCIUM 600+D) 600-400 MG-UNIT per tablet Take 2 tablets by mouth daily.     . clobetasol cream (TEMOVATE) 6.14 % Apply 1 application topically 2 (two) times daily.    Marland Kitchen estradiol (ESTRACE) 0.1 MG/GM vaginal cream Place 1 Applicatorful vaginally 3 (three) times a week. 42.5 g 12  . Fluticasone-Salmeterol (ADVAIR DISKUS) 250-50 MCG/DOSE AEPB Inhale 1 puff into the lungs 2 (two) times daily. 60 each 3  .  ketoconazole (NIZORAL) 2 % shampoo Apply 1 application topically 2 (two) times a week.    . mupirocin ointment (BACTROBAN) 2 % Use w/dressing change qd or bid 30 g 0  . torsemide (DEMADEX) 20 MG tablet Take 2 tablets (40 mg total) by mouth daily. 180 tablet 6  . vitamin C (ASCORBIC ACID) 500 MG tablet Take 1 tablet (500 mg total) by mouth daily. 100 tablet 3   No current facility-administered medications on file prior to visit.     Allergies  Allergen Reactions  . Cefuroxime Diarrhea  . Celecoxib Nausea Only  . Deltasone [Prednisone]     Oral deltasone is causing swelling (can use Depo-Medrol)  . Ranitidine     bloating  . Spironolactone     High K  . Tapazole [Methimazole]     rash  . Tramadol Hcl Nausea Only    Family History  Problem Relation Age of Onset  . Dementia Mother   . Heart disease Mother   . Mental retardation Mother   . Hypertension Mother   . Alzheimer's disease Mother   . Heart attack Father   . Alcohol abuse Father   . Prostate cancer Brother   . Diabetes Other   . Colon cancer Neg Hx   . Anesthesia problems Neg Hx     BP 118/64 (BP Location: Left Arm, Patient Position: Sitting, Cuff Size: Normal)   Pulse 76   Wt 120 lb 3.2 oz (54.5 kg)   SpO2 94%   BMI 21.29 kg/m    Review of Systems Denies fever and weight change.      Objective:   Physical Exam VITAL SIGNS:  See vs page GENERAL: no distress Neck: large multinodular goiter (L>>R) is again noted.  Neuro: coarse tremor of the hands.   Skin: not diaphoretic.   Lab Results  Component Value Date   TSH 6.41 (H) 08/20/2017   Lab Results  Component Value Date   ALT 118 (H) 08/20/2017   AST 80 (H) 08/20/2017   ALKPHOS 69 08/20/2017   BILITOT 0.9 08/20/2017       Assessment & Plan:  Hypothyroidism: slightly overcontrolled Transaminitis, new Itching, prob due to dyshidrosis  Patient Instructions  Please stop taking the propylthiouracil, as you might be having side effects.     Please come back for a follow-up appointment in 2 weeks.

## 2017-08-30 NOTE — Patient Instructions (Addendum)
Please stop taking the propylthiouracil, as you might be having side effects.   Please come back for a follow-up appointment in 2 weeks.

## 2017-09-13 ENCOUNTER — Encounter: Payer: Self-pay | Admitting: Endocrinology

## 2017-09-13 ENCOUNTER — Ambulatory Visit (INDEPENDENT_AMBULATORY_CARE_PROVIDER_SITE_OTHER): Payer: Medicare Other | Admitting: Endocrinology

## 2017-09-13 VITALS — BP 120/68 | HR 74 | Temp 98.7°F | Wt 119.0 lb

## 2017-09-13 DIAGNOSIS — K769 Liver disease, unspecified: Secondary | ICD-10-CM | POA: Diagnosis not present

## 2017-09-13 DIAGNOSIS — E0520 Thyrotoxicosis with toxic multinodular goiter without thyrotoxic crisis or storm: Secondary | ICD-10-CM | POA: Diagnosis not present

## 2017-09-13 LAB — HEPATIC FUNCTION PANEL
ALBUMIN: 4.6 g/dL (ref 3.5–5.2)
ALK PHOS: 74 U/L (ref 39–117)
ALT: 24 U/L (ref 0–35)
AST: 31 U/L (ref 0–37)
Bilirubin, Direct: 0.1 mg/dL (ref 0.0–0.3)
TOTAL PROTEIN: 7.4 g/dL (ref 6.0–8.3)
Total Bilirubin: 0.6 mg/dL (ref 0.2–1.2)

## 2017-09-13 LAB — TSH: TSH: 1.98 u[IU]/mL (ref 0.35–4.50)

## 2017-09-13 LAB — T4, FREE: Free T4: 0.78 ng/dL (ref 0.60–1.60)

## 2017-09-13 NOTE — Progress Notes (Signed)
Subjective:    Patient ID: Mia Contreras, female    DOB: 10-Jun-1929, 84 y.o.   MRN: [id number]  HPI Pt returns for f/u of hyperthyroidism (due to large multinodular goiter; dx'ed 2008; bx then showed HYPERPLASTICNODULE. in 2011, pt had RAI for hyperthyroidism; she became euthyroid, last [name] in early 2015 was unchanged; in 2017, hyperthyroidism recurred; plan was for repeat RAI, but RAI uptake was low; pt says she does not take any iodine-containing products, so she was rx'ed tapazole; this was changed to PTU, due to a rash; the rash was since determined to psoriasis).  pt states moderate itching throughout the body, and assoc dry skin.  She has anxiety, but she denies palpitations and tremor.   Past Medical History:  Diagnosis Date  . AKI (acute kidney injury) ([place]) 01/2016  . ANEMIA-NOS 08/20/2007  . ANXIETY 03/16/2007  . BREAST CANCER, HX OF 03/16/2007  . CARPAL TUNNEL SYNDROME, RIGHT 01/11/2008  . Cataract    floaters  . CELLULITIS, LEG, RIGHT 08/20/2007  . Collagenous colitis   . COPD (chronic obstructive pulmonary disease) ([place])   . DIVERTICULOSIS, COLON 08/20/2007  . GOITER, MULTINODULAR 05/06/2010   Dr [name]  . HYPERLIPIDEMIA 08/20/2007  . HYPERTENSION 03/16/2007  . HYPERTHYROIDISM 05/23/2010  . IBS (irritable bowel syndrome)   . OSTEOARTHRITIS 03/16/2007  . OSTEOPOROSIS 08/20/2007  . Pneumonia 07/2011   took abx for several weeks  . PREMATURE ATRIAL CONTRACTIONS 09/21/2010  . SCOLIOSIS 01/11/2008  . SKIN CANCER, HX OF 11/12/2008    R cheek 2010, L Cheek 2011 Dr. Tonia Brooms  . Thyroid nodule   . VENOUS INSUFFICIENCY 09/05/2007    Past Surgical History:  Procedure Laterality Date  . ABDOMINAL HYSTERECTOMY    . APPENDECTOMY  1972  . BREAST LUMPECTOMY Left 2001  . CARPAL TUNNEL RELEASE    . CATARACT EXTRACTION    . EYE SURGERY     cataract ext  . HERNIA REPAIR    . MASTECTOMY, PARTIAL Left   . SKIN CANCER EXCISION     face-Dr. Bing Plume  . TOTAL KNEE ARTHROPLASTY    . TOTAL  KNEE ARTHROPLASTY Right 2010   Dr Para March  . TOTAL KNEE ARTHROPLASTY  10/30/2011   Procedure: TOTAL KNEE ARTHROPLASTY;  Surgeon: Lorn Junes, MD;  Location: [facility];  Service: Orthopedics;  Laterality: Left;     . XRT     chemo, surgery    Social History   Socioeconomic History  . Marital status: Single    Spouse name: Not on file  . Number of children: Not on file  . Years of education: Not on file  . Highest education level: Not on file  Social Needs  . Financial resource strain: Not on file  . Food insecurity - worry: Not on file  . Food insecurity - inability: Not on file  . Transportation needs - medical: Not on file  . Transportation needs - non-medical: Not on file  Occupational History  . Not on file  Tobacco Use  . Smoking status: Former Smoker    Last attempt to quit: 12/20/1980    Years since quitting: 36.7  . Smokeless tobacco: Never Used  Substance and Sexual Activity  . Alcohol use: Yes    Comment: socially  . Drug use: No  . Sexual activity: Not Currently  Other Topics Concern  . Not on file  Social History Narrative  . Not on file    Current Outpatient Medications on File Prior to Visit  Medication  Sig Dispense Refill  . Acetaminophen (TYLENOL ARTHRITIS PAIN PO) Take 1 tablet by mouth daily as needed (pain).    Marland Kitchen ALPRAZolam (XANAX) 0.5 MG tablet Take 1 tablet (0.5 mg total) by mouth 2 (two) times daily as needed for anxiety. 60 tablet 2  . apixaban (ELIQUIS) 2.5 MG TABS tablet Take 1 tablet (2.5 mg total) by mouth 2 (two) times daily. 180 tablet 3  . betamethasone dipropionate (DIPROLENE) 0.05 % cream Apply topically daily.    . Calcium Carbonate-Vitamin D (CALCIUM 600+D) 600-400 MG-UNIT per tablet Take 2 tablets by mouth daily.     . clobetasol cream (TEMOVATE) 1.09 % Apply 1 application topically 2 (two) times daily.    . Fluticasone-Salmeterol (ADVAIR DISKUS) 250-50 MCG/DOSE AEPB Inhale 1 puff into the lungs 2 (two) times daily. 60 each 3  .  ketoconazole (NIZORAL) 2 % shampoo Apply 1 application topically 2 (two) times a week.    . mupirocin ointment (BACTROBAN) 2 % Use w/dressing change qd or bid 30 g 0  . torsemide (DEMADEX) 20 MG tablet Take 2 tablets (40 mg total) by mouth daily. 180 tablet 6  . vitamin C (ASCORBIC ACID) 500 MG tablet Take 1 tablet (500 mg total) by mouth daily. 100 tablet 3   No current facility-administered medications on file prior to visit.     Allergies  Allergen Reactions  . Cefuroxime Diarrhea  . Celecoxib Nausea Only  . Deltasone [Prednisone]     Oral deltasone is causing swelling (can use Depo-Medrol)  . Ranitidine     bloating  . Spironolactone     High K  . Tapazole [Methimazole]     rash  . Tramadol Hcl Nausea Only    Family History  Problem Relation Age of Onset  . Dementia Mother   . Heart disease Mother   . Mental retardation Mother   . Hypertension Mother   . Alzheimer's disease Mother   . Heart attack Father   . Alcohol abuse Father   . Prostate cancer Brother   . Diabetes Other   . Colon cancer Neg Hx   . Anesthesia problems Neg Hx     BP 120/68 (BP Location: Right Arm, Patient Position: Sitting, Cuff Size: Normal)   Pulse 74   Temp 98.7 F (37.1 C) (Oral)   Wt 119 lb (54 kg)   SpO2 96%   BMI 21.08 kg/m    Review of Systems Denies fever and n/v    Objective:   Physical Exam VITAL SIGNS:  See vs page GENERAL: no distress Neck: large multinodular goiter (L>>R) is again noted.  Neuro: coarse tremor of the hands.   Skin: not diaphoretic.      Assessment & Plan:  elev LFT, due for recheck Hyperthyroidism: due for recheck Rash, uncertain etiology, persistent off PTU  Patient Instructions  blood tests are requested for you today.  We'll let you know about the results. If the thyroid is high again, I can prescribe the methimazole for you again. If ever you have fever while taking methimazole, stop it and call us, even if the reason is obvious, because of  the risk of a rare side-effect. Please come back for a follow-up appointment in 1 month.

## 2017-09-13 NOTE — Patient Instructions (Addendum)
blood tests are requested for you today.  We'll let you know about the results. If the thyroid is high again, I can prescribe the methimazole for you again. If ever you have fever while taking methimazole, stop it and call us, even if the reason is obvious, because of the risk of a rare side-effect. Please come back for a follow-up appointment in 1 month.

## 2017-09-14 ENCOUNTER — Ambulatory Visit (INDEPENDENT_AMBULATORY_CARE_PROVIDER_SITE_OTHER): Payer: Medicare Other | Admitting: Internal Medicine

## 2017-09-14 ENCOUNTER — Encounter: Payer: Self-pay | Admitting: Internal Medicine

## 2017-09-14 DIAGNOSIS — K769 Liver disease, unspecified: Secondary | ICD-10-CM | POA: Diagnosis not present

## 2017-09-14 DIAGNOSIS — N184 Chronic kidney disease, stage 4 (severe): Secondary | ICD-10-CM

## 2017-09-14 DIAGNOSIS — I10 Essential (primary) hypertension: Secondary | ICD-10-CM

## 2017-09-14 DIAGNOSIS — I5022 Chronic systolic (congestive) heart failure: Secondary | ICD-10-CM

## 2017-09-14 DIAGNOSIS — L409 Psoriasis, unspecified: Secondary | ICD-10-CM | POA: Diagnosis not present

## 2017-09-14 LAB — HEPATITIS B SURFACE ANTIBODY, QUANTITATIVE

## 2017-09-14 LAB — HEPATITIS C ANTIBODY
Hepatitis C Ab: NONREACTIVE
SIGNAL TO CUT-OFF: 0.03 (ref <1.00)

## 2017-09-14 NOTE — Assessment & Plan Note (Signed)
Monitoring renal tests

## 2017-09-14 NOTE — Assessment & Plan Note (Signed)
Rx per Dr Renda Rolls

## 2017-09-14 NOTE — Assessment & Plan Note (Signed)
Torsemide 

## 2017-09-14 NOTE — Assessment & Plan Note (Signed)
On Eliquis,Torsemide

## 2017-09-14 NOTE — Progress Notes (Signed)
Subjective:  Patient ID: Mia Contreras, female    DOB: Nov 04, 1928  Age: 84 y.o. MRN: [id number]  CC: No chief complaint on file.   HPI Yahaira Bruski Coulson presents for hyperthyroidism, CHF on meds, leg wound and anxiety f/u  Outpatient Medications Prior to Visit  Medication Sig Dispense Refill  . Acetaminophen (TYLENOL ARTHRITIS PAIN PO) Take 1 tablet by mouth daily as needed (pain).    Marland Kitchen ALPRAZolam (XANAX) 0.5 MG tablet Take 1 tablet (0.5 mg total) by mouth 2 (two) times daily as needed for anxiety. 60 tablet 2  . apixaban (ELIQUIS) 2.5 MG TABS tablet Take 1 tablet (2.5 mg total) by mouth 2 (two) times daily. 180 tablet 3  . betamethasone dipropionate (DIPROLENE) 0.05 % cream Apply topically daily.    . Calcium Carbonate-Vitamin D (CALCIUM 600+D) 600-400 MG-UNIT per tablet Take 2 tablets by mouth daily.     . clobetasol cream (TEMOVATE) 7.32 % Apply 1 application topically 2 (two) times daily.    . Fluticasone-Salmeterol (ADVAIR DISKUS) 250-50 MCG/DOSE AEPB Inhale 1 puff into the lungs 2 (two) times daily. 60 each 3  . ketoconazole (NIZORAL) 2 % shampoo Apply 1 application topically 2 (two) times a week.    . mupirocin ointment (BACTROBAN) 2 % Use w/dressing change qd or bid 30 g 0  . torsemide (DEMADEX) 20 MG tablet Take 2 tablets (40 mg total) by mouth daily. 180 tablet 6  . vitamin C (ASCORBIC ACID) 500 MG tablet Take 1 tablet (500 mg total) by mouth daily. 100 tablet 3  . estradiol (ESTRACE) 0.1 MG/GM vaginal cream Place 1 Applicatorful vaginally 3 (three) times a week. 42.5 g 12   No facility-administered medications prior to visit.     ROS Review of Systems  Constitutional: Positive for fatigue. Negative for activity change, appetite change, chills and unexpected weight change.  HENT: Negative for congestion, mouth sores and sinus pressure.   Eyes: Negative for visual disturbance.  Respiratory: Negative for cough and chest tightness.   Cardiovascular: Negative for chest pain,  palpitations and leg swelling.  Gastrointestinal: Negative for abdominal pain and nausea.  Genitourinary: Negative for difficulty urinating, frequency and vaginal pain.  Musculoskeletal: Positive for arthralgias. Negative for back pain and gait problem.  Skin: Positive for color change and rash. Negative for pallor.  Neurological: Negative for dizziness, tremors, weakness, numbness and headaches.  Psychiatric/Behavioral: Negative for confusion and sleep disturbance.    Objective:  BP 128/76 (BP Location: Right Arm, Patient Position: Sitting, Cuff Size: Normal)   Pulse 68   Temp 98 F (36.7 C) (Oral)   Ht 5\' 3"  (1.6 m)   Wt 120 lb (54.4 kg)   SpO2 98%   BMI 21.26 kg/m   BP Readings from Last 3 Encounters:  09/14/17 128/76  09/13/17 120/68  08/30/17 118/64    Wt Readings from Last 3 Encounters:  09/14/17 120 lb (54.4 kg)  09/13/17 119 lb (54 kg)  08/30/17 120 lb 3.2 oz (54.5 kg)    Physical Exam  Constitutional: She appears well-developed. No distress.  HENT:  Head: Normocephalic.  Right Ear: External ear normal.  Left Ear: External ear normal.  Nose: Nose normal.  Mouth/Throat: Oropharynx is clear and moist.  Eyes: Conjunctivae are normal. Pupils are equal, round, and reactive to light. Right eye exhibits no discharge. Left eye exhibits no discharge.  Neck: Normal range of motion. Neck supple. No JVD present. No tracheal deviation present. No thyromegaly present.  Cardiovascular: Normal rate, regular rhythm  and normal heart sounds.  Pulmonary/Chest: No stridor. No respiratory distress. She has no wheezes.  Abdominal: Soft. Bowel sounds are normal. She exhibits no distension and no mass. There is no tenderness. There is no rebound and no guarding.  Musculoskeletal: She exhibits no edema or tenderness.  Lymphadenopathy:    She has no cervical adenopathy.  Neurological: She displays normal reflexes. No cranial nerve deficit. She exhibits normal muscle tone. Coordination  normal.  Skin: No rash noted. No erythema.  Psychiatric: She has a normal mood and affect. Her behavior is normal. Judgment and thought content normal.  scab on the R knee Rash on scalp - scaly No edema  Lab Results  Component Value Date   WBC 8.6 08/24/2017   HGB 13.4 08/24/2017   HCT 40.2 08/24/2017   PLT 285.0 08/24/2017   GLUCOSE 96 08/24/2017   CHOL 130 11/04/2015   TRIG 77.0 11/04/2015   HDL 54.50 11/04/2015   LDLCALC 60 11/04/2015   ALT 24 09/13/2017   AST 31 09/13/2017   NA 141 08/24/2017   K 3.5 08/24/2017   CL 97 08/24/2017   CREATININE 1.51 (H) 08/24/2017   BUN 25 (H) 08/24/2017   CO2 34 (H) 08/24/2017   TSH 1.98 09/13/2017   INR 0.95 10/25/2011   HGBA1C 5.8 02/06/2014   MICROALBUR 0.6 03/23/2011    No results found.  Assessment & Plan:   There are no diagnoses linked to this encounter. I have discontinued [place] Clare. Keizer's estradiol. I am also having her maintain her Calcium Carbonate-Vitamin D, Fluticasone-Salmeterol, vitamin C, Acetaminophen (TYLENOL ARTHRITIS PAIN PO), torsemide, apixaban, betamethasone dipropionate, ALPRAZolam, mupirocin ointment, ketoconazole, and clobetasol cream.  No orders of the defined types were placed in this encounter.    Follow-up: No Follow-up on file.  Walker Kehr, MD

## 2017-09-19 ENCOUNTER — Ambulatory Visit: Payer: Medicare Other | Admitting: Endocrinology

## 2017-09-26 ENCOUNTER — Other Ambulatory Visit (HOSPITAL_COMMUNITY): Payer: Self-pay | Admitting: Pharmacist

## 2017-09-26 MED ORDER — APIXABAN 2.5 MG PO TABS: 2.5000 mg | ORAL_TABLET | Freq: Two times a day (BID) | ORAL | 3 refills | Status: DC

## 2017-09-27 DIAGNOSIS — L400 Psoriasis vulgaris: Secondary | ICD-10-CM | POA: Diagnosis not present

## 2017-09-28 DIAGNOSIS — H04123 Dry eye syndrome of bilateral lacrimal glands: Secondary | ICD-10-CM | POA: Diagnosis not present

## 2017-09-28 DIAGNOSIS — Z961 Presence of intraocular lens: Secondary | ICD-10-CM | POA: Diagnosis not present

## 2017-09-28 DIAGNOSIS — H35373 Puckering of macula, bilateral: Secondary | ICD-10-CM | POA: Diagnosis not present

## 2017-09-28 DIAGNOSIS — H52223 Regular astigmatism, bilateral: Secondary | ICD-10-CM | POA: Diagnosis not present

## 2017-09-28 DIAGNOSIS — H524 Presbyopia: Secondary | ICD-10-CM | POA: Diagnosis not present

## 2017-09-28 DIAGNOSIS — H5213 Myopia, bilateral: Secondary | ICD-10-CM | POA: Diagnosis not present

## 2017-10-15 ENCOUNTER — Encounter: Payer: Self-pay | Admitting: Endocrinology

## 2017-10-15 ENCOUNTER — Ambulatory Visit (INDEPENDENT_AMBULATORY_CARE_PROVIDER_SITE_OTHER): Payer: Medicare Other | Admitting: Endocrinology

## 2017-10-15 VITALS — BP 118/72 | HR 71 | Ht 63.0 in | Wt 119.8 lb

## 2017-10-15 DIAGNOSIS — K769 Liver disease, unspecified: Secondary | ICD-10-CM | POA: Diagnosis not present

## 2017-10-15 DIAGNOSIS — E0520 Thyrotoxicosis with toxic multinodular goiter without thyrotoxic crisis or storm: Secondary | ICD-10-CM | POA: Diagnosis not present

## 2017-10-15 LAB — HEPATIC FUNCTION PANEL
ALT: 14 U/L (ref 0–35)
AST: 26 U/L (ref 0–37)
Albumin: 4.3 g/dL (ref 3.5–5.2)
Alkaline Phosphatase: 70 U/L (ref 39–117)
Bilirubin, Direct: 0.2 mg/dL (ref 0.0–0.3)
Total Bilirubin: 0.6 mg/dL (ref 0.2–1.2)
Total Protein: 7 g/dL (ref 6.0–8.3)

## 2017-10-15 LAB — TSH: TSH: 2.21 u[IU]/mL (ref 0.35–4.50)

## 2017-10-15 LAB — T4, FREE: FREE T4: 0.63 ng/dL (ref 0.60–1.60)

## 2017-10-15 NOTE — Progress Notes (Signed)
Subjective:    Patient ID: Mia Contreras, female    DOB: [birth year], 84 y.o.   MRN: [id number]  HPI Pt returns for f/u of hyperthyroidism (due to large multinodular goiter; dx'ed 2008; bx then showed HYPERPLASTICNODULE. in 2011, pt had RAI for hyperthyroidism; she became euthyroid, last [name] in early 2015 was unchanged; in 2017, hyperthyroidism recurred; plan was for repeat RAI, but RAI uptake was low; pt says she does not take any iodine-containing products, so she was rx'ed tapazole; this was changed to PTU, due to a rash; the rash was since determined to psoriasis).  Off PTU, pt states she feels no different, and well in general.  Past Medical History:  Diagnosis Date  . AKI (acute kidney injury) ([place]) 01/2016  . ANEMIA-NOS 08/20/2007  . ANXIETY 03/16/2007  . BREAST CANCER, HX OF 03/16/2007  . CARPAL TUNNEL SYNDROME, RIGHT 01/11/2008  . Cataract    floaters  . CELLULITIS, LEG, RIGHT 08/20/2007  . Collagenous colitis   . COPD (chronic obstructive pulmonary disease) ([place])   . DIVERTICULOSIS, COLON 08/20/2007  . GOITER, MULTINODULAR 05/06/2010   Dr [name]  . HYPERLIPIDEMIA 08/20/2007  . HYPERTENSION 03/16/2007  . HYPERTHYROIDISM 05/23/2010  . IBS (irritable bowel syndrome)   . OSTEOARTHRITIS 03/16/2007  . OSTEOPOROSIS 08/20/2007  . Pneumonia 07/2011   took abx for several weeks  . PREMATURE ATRIAL CONTRACTIONS 09/21/2010  . SCOLIOSIS 01/11/2008  . SKIN CANCER, HX OF 11/12/2008    R cheek 2010, L Cheek 2011 Dr. Tonia Brooms  . Thyroid nodule   . VENOUS INSUFFICIENCY 09/05/2007    Past Surgical History:  Procedure Laterality Date  . ABDOMINAL HYSTERECTOMY    . APPENDECTOMY  1972  . BREAST LUMPECTOMY Left 2001  . CARPAL TUNNEL RELEASE    . CATARACT EXTRACTION    . EYE SURGERY     cataract ext  . HERNIA REPAIR    . MASTECTOMY, PARTIAL Left   . SKIN CANCER EXCISION     face-Dr. Bing Plume  . TOTAL KNEE ARTHROPLASTY    . TOTAL KNEE ARTHROPLASTY Right 2010   Dr Para March  . TOTAL KNEE  ARTHROPLASTY  10/30/2011   Procedure: TOTAL KNEE ARTHROPLASTY;  Surgeon: Lorn Junes, MD;  Location: [facility];  Service: Orthopedics;  Laterality: Left;     . XRT     chemo, surgery    Social History   Socioeconomic History  . Marital status: Single    Spouse name: Not on file  . Number of children: Not on file  . Years of education: Not on file  . Highest education level: Not on file  Social Needs  . Financial resource strain: Not on file  . Food insecurity - worry: Not on file  . Food insecurity - inability: Not on file  . Transportation needs - medical: Not on file  . Transportation needs - non-medical: Not on file  Occupational History  . Not on file  Tobacco Use  . Smoking status: Former Smoker    Last attempt to quit: 12/20/1980    Years since quitting: 36.8  . Smokeless tobacco: Never Used  Substance and Sexual Activity  . Alcohol use: Yes    Comment: socially  . Drug use: No  . Sexual activity: Not Currently  Other Topics Concern  . Not on file  Social History Narrative  . Not on file    Current Outpatient Medications on File Prior to Visit  Medication Sig Dispense Refill  . Acetaminophen (TYLENOL ARTHRITIS PAIN PO)  Take 1 tablet by mouth daily as needed (pain).    Marland Kitchen ALPRAZolam (XANAX) 0.5 MG tablet Take 1 tablet (0.5 mg total) by mouth 2 (two) times daily as needed for anxiety. 60 tablet 2  . apixaban (ELIQUIS) 2.5 MG TABS tablet Take 1 tablet (2.5 mg total) by mouth 2 (two) times daily. 180 tablet 3  . betamethasone dipropionate (DIPROLENE) 0.05 % cream Apply topically daily.    . Calcium Carbonate-Vitamin D (CALCIUM 600+D) 600-400 MG-UNIT per tablet Take 2 tablets by mouth daily.     . clobetasol cream (TEMOVATE) 8.27 % Apply 1 application topically 2 (two) times daily.    . coal tar (NEUTROGENA T-GEL) 0.5 % shampoo Apply topically at bedtime as needed.    Marland Kitchen ketoconazole (NIZORAL) 2 % shampoo Apply 1 application topically 2 (two) times a week.    . mupirocin  ointment (BACTROBAN) 2 % Use w/dressing change qd or bid 30 g 0  . torsemide (DEMADEX) 20 MG tablet Take 2 tablets (40 mg total) by mouth daily. 180 tablet 6  . vitamin C (ASCORBIC ACID) 500 MG tablet Take 1 tablet (500 mg total) by mouth daily. 100 tablet 3   No current facility-administered medications on file prior to visit.     Allergies  Allergen Reactions  . Cefuroxime Diarrhea  . Celecoxib Nausea Only  . Deltasone [Prednisone]     Oral deltasone is causing swelling (can use Depo-Medrol)  . Ranitidine     bloating  . Spironolactone     High K  . Tapazole [Methimazole]     rash  . Tramadol Hcl Nausea Only    Family History  Problem Relation Age of Onset  . Dementia Mother   . Heart disease Mother   . Mental retardation Mother   . Hypertension Mother   . Alzheimer's disease Mother   . Heart attack Father   . Alcohol abuse Father   . Prostate cancer Brother   . Diabetes Other   . Colon cancer Neg Hx   . Anesthesia problems Neg Hx     BP 118/72   Pulse 71   Ht 5\' 3"  (1.6 m)   Wt 119 lb 12.8 oz (54.3 kg)   SpO2 97%   BMI 21.22 kg/m   Review of Systems Denies fever    Objective:   Physical Exam VITAL SIGNS:  See vs page GENERAL: no distress Neck: large goiter (L>>R) is again noted, with irreg surface. Neuro: coarse tremor of the hands.   Skin: not diaphoretic.       Assessment & Plan:  Hyperthyroidism, due for recheck:  We discussed.  If hyperthyroid, she wants RAI. elev hepatic transaminases.  Recheck today  Patient Instructions  blood tests are requested for you today.  We'll let you know about the results. If the thyroid is high again, I'll request the radioactive iodine for you.

## 2017-10-15 NOTE — Patient Instructions (Addendum)
blood tests are requested for you today.  We'll let you know about the results. If the thyroid is high again, I'll request the radioactive iodine for you.

## 2017-10-16 ENCOUNTER — Telehealth: Payer: Self-pay | Admitting: Endocrinology

## 2017-10-16 NOTE — Telephone Encounter (Signed)
Error

## 2017-10-16 NOTE — Telephone Encounter (Signed)
Patient was in the office yesterday and stated that the dr was waiting on the lab results to come back and did not mention when she needed to come back in for a follow up She states all lab results came back good and would like to know when the dr would like her back in to see him  Please advise

## 2017-10-16 NOTE — Telephone Encounter (Signed)
Please come back for a follow-up appointment in 4-6 weeks

## 2017-10-16 NOTE — Telephone Encounter (Signed)
See message and please advise, Thanks!  

## 2017-10-16 NOTE — Telephone Encounter (Signed)
Patient scheduled for a 6 week follow up.

## 2017-10-26 DIAGNOSIS — L821 Other seborrheic keratosis: Secondary | ICD-10-CM | POA: Diagnosis not present

## 2017-10-26 DIAGNOSIS — L400 Psoriasis vulgaris: Secondary | ICD-10-CM | POA: Diagnosis not present

## 2017-10-26 DIAGNOSIS — D692 Other nonthrombocytopenic purpura: Secondary | ICD-10-CM | POA: Diagnosis not present

## 2017-10-26 DIAGNOSIS — L820 Inflamed seborrheic keratosis: Secondary | ICD-10-CM | POA: Diagnosis not present

## 2017-11-12 ENCOUNTER — Ambulatory Visit (INDEPENDENT_AMBULATORY_CARE_PROVIDER_SITE_OTHER): Payer: Medicare Other | Admitting: Internal Medicine

## 2017-11-12 ENCOUNTER — Encounter: Payer: Self-pay | Admitting: Internal Medicine

## 2017-11-12 DIAGNOSIS — M810 Age-related osteoporosis without current pathological fracture: Secondary | ICD-10-CM

## 2017-11-12 DIAGNOSIS — H6121 Impacted cerumen, right ear: Secondary | ICD-10-CM

## 2017-11-12 DIAGNOSIS — F419 Anxiety disorder, unspecified: Secondary | ICD-10-CM

## 2017-11-12 DIAGNOSIS — I10 Essential (primary) hypertension: Secondary | ICD-10-CM

## 2017-11-12 DIAGNOSIS — I480 Paroxysmal atrial fibrillation: Secondary | ICD-10-CM

## 2017-11-12 DIAGNOSIS — L409 Psoriasis, unspecified: Secondary | ICD-10-CM

## 2017-11-12 DIAGNOSIS — I5021 Acute systolic (congestive) heart failure: Secondary | ICD-10-CM

## 2017-11-12 DIAGNOSIS — K769 Liver disease, unspecified: Secondary | ICD-10-CM

## 2017-11-12 MED ORDER — ALPRAZOLAM 0.5 MG PO TABS: 0.5000 mg | ORAL_TABLET | Freq: Two times a day (BID) | ORAL | 3 refills | Status: DC | PRN

## 2017-11-12 MED ORDER — DENOSUMAB 60 MG/ML ~~LOC~~ SOLN
60.0000 mg | Freq: Once | SUBCUTANEOUS | Status: AC
  Administered 2017-11-12: 60 mg via SUBCUTANEOUS

## 2017-11-12 NOTE — Assessment & Plan Note (Signed)
Demadex  

## 2017-11-12 NOTE — Assessment & Plan Note (Signed)
Prolia sq today

## 2017-11-12 NOTE — Assessment & Plan Note (Signed)
LFT

## 2017-11-12 NOTE — Assessment & Plan Note (Signed)
Xanax prn 

## 2017-11-12 NOTE — Assessment & Plan Note (Signed)
On Xarelto 

## 2017-11-12 NOTE — Assessment & Plan Note (Signed)
See procedure 

## 2017-11-12 NOTE — Assessment & Plan Note (Signed)
F/u w/Dr Ubaldo Glassing

## 2017-11-12 NOTE — Progress Notes (Signed)
Subjective:  Patient ID: Mia Contreras, female    DOB: 1929-01-05  Age: 84 y.o. MRN: [id number]  CC: No chief complaint on file.   HPI Mia Contreras presents for eczema, anxiety, CHF, HTN f/u  Outpatient Medications Prior to Visit  Medication Sig Dispense Refill  . Acetaminophen (TYLENOL ARTHRITIS PAIN PO) Take 1 tablet by mouth daily as needed (pain).    Marland Kitchen ALPRAZolam (XANAX) 0.5 MG tablet Take 1 tablet (0.5 mg total) by mouth 2 (two) times daily as needed for anxiety. 60 tablet 2  . apixaban (ELIQUIS) 2.5 MG TABS tablet Take 1 tablet (2.5 mg total) by mouth 2 (two) times daily. 180 tablet 3  . betamethasone dipropionate (DIPROLENE) 0.05 % cream Apply topically daily.    . Calcium Carbonate-Vitamin D (CALCIUM 600+D) 600-400 MG-UNIT per tablet Take 2 tablets by mouth daily.     . clobetasol cream (TEMOVATE) 6.46 % Apply 1 application topically 2 (two) times daily.    . coal tar (NEUTROGENA T-GEL) 0.5 % shampoo Apply topically at bedtime as needed.    Marland Kitchen ketoconazole (NIZORAL) 2 % shampoo Apply 1 application topically 2 (two) times a week.    . torsemide (DEMADEX) 20 MG tablet Take 2 tablets (40 mg total) by mouth daily. 180 tablet 6  . vitamin C (ASCORBIC ACID) 500 MG tablet Take 1 tablet (500 mg total) by mouth daily. 100 tablet 3  . mupirocin ointment (BACTROBAN) 2 % Use w/dressing change qd or bid 30 g 0   No facility-administered medications prior to visit.     ROS Review of Systems  Constitutional: Positive for fatigue. Negative for activity change, appetite change, chills and unexpected weight change.  HENT: Negative for congestion, mouth sores and sinus pressure.   Eyes: Negative for visual disturbance.  Respiratory: Negative for cough and chest tightness.   Gastrointestinal: Negative for abdominal pain and nausea.  Genitourinary: Negative for difficulty urinating, frequency and vaginal pain.  Musculoskeletal: Negative for back pain and gait problem.  Skin: Negative for  pallor and rash.  Neurological: Negative for dizziness, tremors, weakness, numbness and headaches.  Psychiatric/Behavioral: Positive for dysphoric mood. Negative for confusion, sleep disturbance and suicidal ideas. The patient is nervous/anxious.     Objective:  BP 116/72 (BP Location: Right Arm, Patient Position: Sitting, Cuff Size: Normal)   Pulse 64   Temp 98.2 F (36.8 C) (Oral)   Ht 5\' 3"  (1.6 m)   Wt 117 lb (53.1 kg)   SpO2 98%   BMI 20.73 kg/m   BP Readings from Last 3 Encounters:  11/12/17 116/72  10/15/17 118/72  09/14/17 128/76    Wt Readings from Last 3 Encounters:  11/12/17 117 lb (53.1 kg)  10/15/17 119 lb 12.8 oz (54.3 kg)  09/14/17 120 lb (54.4 kg)    Physical Exam  Constitutional: She appears well-developed. No distress.  HENT:  Head: Normocephalic.  Right Ear: External ear normal.  Left Ear: External ear normal.  Nose: Nose normal.  Mouth/Throat: Oropharynx is clear and moist.  Eyes: Pupils are equal, round, and reactive to light. Conjunctivae are normal. Right eye exhibits no discharge. Left eye exhibits no discharge.  Neck: Normal range of motion. Neck supple. No JVD present. No tracheal deviation present. No thyromegaly present.  Cardiovascular: Normal rate, regular rhythm and normal heart sounds.  Pulmonary/Chest: No stridor. No respiratory distress. She has no wheezes.  Abdominal: Soft. Bowel sounds are normal. She exhibits no distension and no mass. There is no tenderness. There  is no rebound and no guarding.  Musculoskeletal: She exhibits tenderness. She exhibits no edema.  Lymphadenopathy:    She has no cervical adenopathy.  Neurological: She displays normal reflexes. No cranial nerve deficit. She exhibits normal muscle tone. Coordination abnormal.  Skin: Rash noted. No erythema.  Psychiatric: She has a normal mood and affect. Her behavior is normal. Judgment and thought content normal.  wax R   Procedure Note :     Procedure :  Ear  irrigation   Indication:  Cerumen impaction R   Risks, including pain, dizziness, eardrum perforation, bleeding, infection and others as well as benefits were explained to the patient in detail. Verbal consent was obtained and the patient agreed to proceed.    We used "The Elephant Ear Irrigation Device" filled with lukewarm water for irrigation. A large amount wax was recovered. Procedure has also required manual wax removal with an ear wax curette and ear forceps.   Tolerated well. Complications: None.   Postprocedure instructions :  Call if problems.    Lab Results  Component Value Date   WBC 8.6 08/24/2017   HGB 13.4 08/24/2017   HCT 40.2 08/24/2017   PLT 285.0 08/24/2017   GLUCOSE 96 08/24/2017   CHOL 130 11/04/2015   TRIG 77.0 11/04/2015   HDL 54.50 11/04/2015   LDLCALC 60 11/04/2015   ALT 14 10/15/2017   AST 26 10/15/2017   NA 141 08/24/2017   K 3.5 08/24/2017   CL 97 08/24/2017   CREATININE 1.51 (H) 08/24/2017   BUN 25 (H) 08/24/2017   CO2 34 (H) 08/24/2017   TSH 2.21 10/15/2017   INR 0.95 10/25/2011   HGBA1C 5.8 02/06/2014   MICROALBUR 0.6 03/23/2011    No results found.  Assessment & Plan:   There are no diagnoses linked to this encounter. I have discontinued Pearla Mckinny. Fitzner's mupirocin ointment. I am also having her maintain her Calcium Carbonate-Vitamin D, vitamin C, Acetaminophen (TYLENOL ARTHRITIS PAIN PO), torsemide, betamethasone dipropionate, ALPRAZolam, ketoconazole, clobetasol cream, apixaban, and coal tar.  No orders of the defined types were placed in this encounter.    Follow-up: No follow-ups on file.  Walker Kehr, MD

## 2017-11-28 ENCOUNTER — Ambulatory Visit (INDEPENDENT_AMBULATORY_CARE_PROVIDER_SITE_OTHER): Payer: Medicare Other | Admitting: Endocrinology

## 2017-11-28 ENCOUNTER — Encounter: Payer: Self-pay | Admitting: Endocrinology

## 2017-11-28 VITALS — BP 128/82 | HR 92 | Wt 120.8 lb

## 2017-11-28 DIAGNOSIS — E0520 Thyrotoxicosis with toxic multinodular goiter without thyrotoxic crisis or storm: Secondary | ICD-10-CM | POA: Diagnosis not present

## 2017-11-28 LAB — T4, FREE: Free T4: 0.62 ng/dL (ref 0.60–1.60)

## 2017-11-28 LAB — TSH: TSH: 1.59 u[IU]/mL (ref 0.35–4.50)

## 2017-11-28 NOTE — Patient Instructions (Addendum)
blood tests are requested for you today.  We'll let you know about the results. If the thyroid has gone high again, I'll request the radioactive iodine for you.  Please come back for a follow-up appointment in 3 months.  

## 2017-11-28 NOTE — Progress Notes (Signed)
Subjective:    Patient ID: Mia Contreras, female    DOB: [birth year], 84 y.o.   MRN: [id number]  HPI Pt returns for f/u of hyperthyroidism (due to large multinodular goiter; dx'ed 2008; bx then showed HYPERPLASTICNODULE. in 2011, pt had RAI for hyperthyroidism; she became euthyroid, last [name] in early 2015 was unchanged; in 2017, hyperthyroidism recurred; plan was for repeat RAI, but RAI uptake was low; pt says she does not take any iodine-containing products, so she was rx'ed tapazole; this was changed to PTU, due to a rash; the rash was since determined to be psoriasis).  pt states she feels well in general, except for fatigue.   Past Medical History:  Diagnosis Date  . AKI (acute kidney injury) ([place]) 01/2016  . ANEMIA-NOS 08/20/2007  . ANXIETY 03/16/2007  . BREAST CANCER, HX OF 03/16/2007  . CARPAL TUNNEL SYNDROME, RIGHT 01/11/2008  . Cataract    floaters  . CELLULITIS, LEG, RIGHT 08/20/2007  . Collagenous colitis   . COPD (chronic obstructive pulmonary disease) ([place])   . DIVERTICULOSIS, COLON 08/20/2007  . GOITER, MULTINODULAR 05/06/2010   Dr [name]  . HYPERLIPIDEMIA 08/20/2007  . HYPERTENSION 03/16/2007  . HYPERTHYROIDISM 05/23/2010  . IBS (irritable bowel syndrome)   . OSTEOARTHRITIS 03/16/2007  . OSTEOPOROSIS 08/20/2007  . Pneumonia 07/2011   took abx for several weeks  . PREMATURE ATRIAL CONTRACTIONS 09/21/2010  . SCOLIOSIS 01/11/2008  . SKIN CANCER, HX OF 11/12/2008    R cheek 2010, L Cheek 2011 Dr. Tonia Brooms  . Thyroid nodule   . VENOUS INSUFFICIENCY 09/05/2007    Past Surgical History:  Procedure Laterality Date  . ABDOMINAL HYSTERECTOMY    . APPENDECTOMY  1972  . BREAST LUMPECTOMY Left 2001  . CARPAL TUNNEL RELEASE    . CATARACT EXTRACTION    . EYE SURGERY     cataract ext  . HERNIA REPAIR    . MASTECTOMY, PARTIAL Left   . SKIN CANCER EXCISION     face-Dr. Bing Plume  . TOTAL KNEE ARTHROPLASTY    . TOTAL KNEE ARTHROPLASTY Right 2010   Dr Para March  . TOTAL KNEE  ARTHROPLASTY  10/30/2011   Procedure: TOTAL KNEE ARTHROPLASTY;  Surgeon: Lorn Junes, MD;  Location: [place];  Service: Orthopedics;  Laterality: Left;     . XRT     chemo, surgery    Social History   Socioeconomic History  . Marital status: Single    Spouse name: Not on file  . Number of children: Not on file  . Years of education: Not on file  . Highest education level: Not on file  Occupational History  . Not on file  Social Needs  . Financial resource strain: Not on file  . Food insecurity:    Worry: Not on file    Inability: Not on file  . Transportation needs:    Medical: Not on file    Non-medical: Not on file  Tobacco Use  . Smoking status: Former Smoker    Last attempt to quit: 12/20/1980    Years since quitting: 36.9  . Smokeless tobacco: Never Used  Substance and Sexual Activity  . Alcohol use: Yes    Comment: socially  . Drug use: No  . Sexual activity: Not Currently  Lifestyle  . Physical activity:    Days per week: Not on file    Minutes per session: Not on file  . Stress: Not on file  Relationships  . Social connections:    Talks on  phone: Not on file    Gets together: Not on file    Attends religious service: Not on file    Active member of club or organization: Not on file    Attends meetings of clubs or organizations: Not on file    Relationship status: Not on file  . Intimate partner violence:    Fear of current or ex partner: Not on file    Emotionally abused: Not on file    Physically abused: Not on file    Forced sexual activity: Not on file  Other Topics Concern  . Not on file  Social History Narrative  . Not on file    Current Outpatient Medications on File Prior to Visit  Medication Sig Dispense Refill  . Acetaminophen (TYLENOL ARTHRITIS PAIN PO) Take 1 tablet by mouth daily as needed (pain).    Marland Kitchen ALPRAZolam (XANAX) 0.5 MG tablet Take 1 tablet (0.5 mg total) by mouth 2 (two) times daily as needed for anxiety. 60 tablet 3  .  apixaban (ELIQUIS) 2.5 MG TABS tablet Take 1 tablet (2.5 mg total) by mouth 2 (two) times daily. 180 tablet 3  . Calcium Carbonate-Vitamin D (CALCIUM 600+D) 600-400 MG-UNIT per tablet Take 2 tablets by mouth daily.     . clobetasol cream (TEMOVATE) 1.61 % Apply 1 application topically 2 (two) times daily.    . coal tar (NEUTROGENA T-GEL) 0.5 % shampoo Apply topically at bedtime as needed.    Marland Kitchen ketoconazole (NIZORAL) 2 % shampoo Apply 1 application topically 2 (two) times a week.    . torsemide (DEMADEX) 20 MG tablet Take 2 tablets (40 mg total) by mouth daily. 180 tablet 6  . vitamin C (ASCORBIC ACID) 500 MG tablet Take 1 tablet (500 mg total) by mouth daily. 100 tablet 3  . betamethasone dipropionate (DIPROLENE) 0.05 % cream Apply topically daily.     No current facility-administered medications on file prior to visit.     Allergies  Allergen Reactions  . Cefuroxime Diarrhea  . Celecoxib Nausea Only  . Deltasone [Prednisone]     Oral deltasone is causing swelling (can use Depo-Medrol)  . Ranitidine     bloating  . Spironolactone     High K  . Tapazole [Methimazole]     rash  . Tramadol Hcl Nausea Only    Family History  Problem Relation Age of Onset  . Dementia Mother   . Heart disease Mother   . Mental retardation Mother   . Hypertension Mother   . Alzheimer's disease Mother   . Heart attack Father   . Alcohol abuse Father   . Prostate cancer Brother   . Diabetes Other   . Colon cancer Neg Hx   . Anesthesia problems Neg Hx     BP 128/82 (BP Location: Left Arm, Patient Position: Sitting, Cuff Size: Normal)   Pulse 92   Wt 120 lb 12.8 oz (54.8 kg)   SpO2 95%   BMI 21.40 kg/m    Review of Systems No weight change    Objective:   Physical Exam VITAL SIGNS:  See vs page GENERAL: no distress Neck: thyroid is approx 3 times normal size (L>>R) is again noted, with irreg surface. Neuro: coarse tremor of the hands.   Skin: not diaphoretic.        Assessment &  Plan:  Multinodular goiter, clinically stable Hyperthyroidism: will recur at some point  Patient Instructions  blood tests are requested for you today.  We'll let you  know about the results. If the thyroid has gone high again, I'll request the radioactive iodine for you.  Please come back for a follow-up appointment in 3 months.

## 2017-12-26 ENCOUNTER — Telehealth (HOSPITAL_COMMUNITY): Payer: Self-pay | Admitting: Pharmacist

## 2017-12-26 NOTE — Telephone Encounter (Signed)
[employer] patient assistance approved for Eliquis 2.5 mg BID through 08/20/18.   Ruta Hinds. Velva Harman, PharmD, BCPS, CPP Clinical Pharmacist Phone: [phone] 12/26/2017 10:03 AM

## 2017-12-27 ENCOUNTER — Other Ambulatory Visit (HOSPITAL_COMMUNITY): Payer: Self-pay | Admitting: Internal Medicine

## 2018-01-21 ENCOUNTER — Encounter: Payer: Self-pay | Admitting: Cardiovascular Disease

## 2018-01-21 ENCOUNTER — Ambulatory Visit (INDEPENDENT_AMBULATORY_CARE_PROVIDER_SITE_OTHER): Payer: Medicare Other | Admitting: Cardiovascular Disease

## 2018-01-21 VITALS — BP 118/72 | HR 157 | Ht 63.0 in | Wt 118.2 lb

## 2018-01-21 DIAGNOSIS — I482 Chronic atrial fibrillation, unspecified: Secondary | ICD-10-CM

## 2018-01-21 DIAGNOSIS — I5032 Chronic diastolic (congestive) heart failure: Secondary | ICD-10-CM | POA: Diagnosis not present

## 2018-01-21 MED ORDER — METOPROLOL TARTRATE 25 MG PO TABS: 25.0000 mg | ORAL_TABLET | Freq: Two times a day (BID) | ORAL | 3 refills | Status: DC

## 2018-01-21 NOTE — Patient Instructions (Addendum)
Medication Instructions:  Your physician has recommended you make the following change in your medication:  1-START Metoprolol 25 mg by mouth twice daily.  Labwork: NONE  Testing/Procedures: NONE  Follow-Up: Your physician recommends that you schedule a follow-up appointment in: 2 to 3 weeks with PA/NP.  Your physician wants you to follow-up in: 3 months with Dr. Johnsie Cancel.   If you need a refill on your cardiac medications before your next appointment, please call your pharmacy.

## 2018-01-21 NOTE — Progress Notes (Signed)
Patient ID: Mia Contreras, female   DOB: Dec 28, 1928, 84 y.o.   MRN: [id number]      Referring Physician: Dr Johnsie Cancel Primary Care: Arlana Lindau, MD Primary Cardiologist: Dr Johnsie Cancel Primary HF: Dr. Haroldine Laws  HPI: Mia Contreras is a 84 y.o. female with a history of nonischemic cardiomyopathy, systolic CHF EF 08-65% in 2016 (echo 45-50% in 11/2015), PACs, L Breast cancer in 2000 with surgery and chemo.  Admitted 4/25 -12/19/15 with A/C combined CHF and AKI.  Discharge weight 134 lbs.   Admitted 6/7 -> 02/07/16 with volume overload. Diuretic changed to deedex  Discharge weight 125 lbs.   Was admitted  (10/17) with severe symptomatic hyperkalemia (K > 7.5) and ARF (Creatinine 5.3). Was initially bradycardic with HRs in 20s. Spiro and potassium stopped. Treated with fluids and bicarb and recovered. On d/c 10/22 K 4.4 Cr 2.2 Discussed Palliative Care  B-blocker stopped.    Went to  [facility] on d/c . Dagnosed with LUE clot at site of IV and Eliquis started. Weight stable. Has restarted PT and now doing exercise bike. Home now    11/2015 ECHO EF 45-50% reviewed   She is frustrated with Rx for hyperthyroidism had rash with tapazole and now on PTU but it makes her Mouth and skin dry    FH: Brother also has CAD and afib, Mother had CABG but did well, Father passed away from MI at age 52 thought to be 2/2 to heavy ETOH use.  SH:  Pt has a distant history of smoking. Pt has 50-60 pack years up to 2ppd. Quit in 1982.   Past Medical History:  Diagnosis Date  . AKI (acute kidney injury) ([place]) 01/2016  . ANEMIA-NOS 08/20/2007  . ANXIETY 03/16/2007  . BREAST CANCER, HX OF 03/16/2007  . CARPAL TUNNEL SYNDROME, RIGHT 01/11/2008  . Cataract    floaters  . CELLULITIS, LEG, RIGHT 08/20/2007  . Collagenous colitis   . COPD (chronic obstructive pulmonary disease) ([place])   . DIVERTICULOSIS, COLON 08/20/2007  . GOITER, MULTINODULAR 05/06/2010   Dr [name]  . HYPERLIPIDEMIA 08/20/2007  .  HYPERTENSION 03/16/2007  . HYPERTHYROIDISM 05/23/2010  . IBS (irritable bowel syndrome)   . OSTEOARTHRITIS 03/16/2007  . OSTEOPOROSIS 08/20/2007  . Pneumonia 07/2011   took abx for several weeks  . PREMATURE ATRIAL CONTRACTIONS 09/21/2010  . SCOLIOSIS 01/11/2008  . SKIN CANCER, HX OF 11/12/2008    R cheek 2010, L Cheek 2011 Dr. Tonia Brooms  . Thyroid nodule   . VENOUS INSUFFICIENCY 09/05/2007    Current Outpatient Medications  Medication Sig Dispense Refill  . Acetaminophen (TYLENOL ARTHRITIS PAIN PO) Take 1 tablet by mouth daily as needed (pain).    Marland Kitchen ALPRAZolam (XANAX) 0.5 MG tablet Take 1 tablet (0.5 mg total) by mouth 2 (two) times daily as needed for anxiety. 60 tablet 3  . apixaban (ELIQUIS) 2.5 MG TABS tablet Take 1 tablet (2.5 mg total) by mouth 2 (two) times daily. Needs office visit for additional refills [phone] 60 tablet 0  . Calcium Carbonate-Vitamin D (CALCIUM 600+D) 600-400 MG-UNIT per tablet Take 2 tablets by mouth daily.     . coal tar (NEUTROGENA T-GEL) 0.5 % shampoo Apply topically at bedtime as needed.    Marland Kitchen ketoconazole (NIZORAL) 2 % shampoo Apply 1 application topically 2 (two) times a week.    . torsemide (DEMADEX) 20 MG tablet Take 2 tablets (40 mg total) by mouth daily. 180 tablet 6  . vitamin C (ASCORBIC ACID) 500 MG tablet Take 1  tablet (500 mg total) by mouth daily. 100 tablet 3   No current facility-administered medications for this visit.     Allergies  Allergen Reactions  . Cefuroxime Diarrhea  . Celecoxib Nausea Only  . Deltasone [Prednisone]     Oral deltasone is causing swelling (can use Depo-Medrol)  . Ranitidine     bloating  . Spironolactone     High K  . Tapazole [Methimazole]     rash  . Tramadol Hcl Nausea Only      Social History   Socioeconomic History  . Marital status: Single    Spouse name: Not on file  . Number of children: Not on file  . Years of education: Not on file  . Highest education level: Not on file  Occupational  History  . Not on file  Social Needs  . Financial resource strain: Not on file  . Food insecurity:    Worry: Not on file    Inability: Not on file  . Transportation needs:    Medical: Not on file    Non-medical: Not on file  Tobacco Use  . Smoking status: Former Smoker    Last attempt to quit: 12/20/1980    Years since quitting: 37.1  . Smokeless tobacco: Never Used  Substance and Sexual Activity  . Alcohol use: Yes    Comment: socially  . Drug use: No  . Sexual activity: Not Currently  Lifestyle  . Physical activity:    Days per week: Not on file    Minutes per session: Not on file  . Stress: Not on file  Relationships  . Social connections:    Talks on phone: Not on file    Gets together: Not on file    Attends religious service: Not on file    Active member of club or organization: Not on file    Attends meetings of clubs or organizations: Not on file    Relationship status: Not on file  . Intimate partner violence:    Fear of current or ex partner: Not on file    Emotionally abused: Not on file    Physically abused: Not on file    Forced sexual activity: Not on file  Other Topics Concern  . Not on file  Social History Narrative  . Not on file      Family History  Problem Relation Age of Onset  . Dementia Mother   . Heart disease Mother   . Mental retardation Mother   . Hypertension Mother   . Alzheimer's disease Mother   . Heart attack Father   . Alcohol abuse Father   . Prostate cancer Brother   . Diabetes Other   . Colon cancer Neg Hx   . Anesthesia problems Neg Hx     Vitals:   01/21/18 1012  BP: 118/72  Pulse: (!) 157  SpO2: 94%  Weight: 118 lb 3.2 oz (53.6 kg)  Height: 5\' 3"  (1.6 m)   Wt Readings from Last 3 Encounters:  01/21/18 118 lb 3.2 oz (53.6 kg)  11/28/17 120 lb 12.8 oz (54.8 kg)  11/12/17 117 lb (53.1 kg)    ECG SR rate 70 LAFB poor R wave progression  PHYSICAL EXAM: BP 118/72   Pulse (!) 157   Ht 5\' 3"  (1.6 m)   Wt 118  lb 3.2 oz (53.6 kg)   SpO2 94%   BMI 20.94 kg/m  Affect appropriate Affect appropriate Frail elderly female  HEENT: normal Neck supple with  no adenopathy JVP normal no bruits no thyromegaly Lungs clear with no wheezing and good diaphragmatic motion Heart:  S1/S2 no murmur, no rub, gallop or click PMI normal Abdomen: benighn, BS positve, no tenderness, no AAA no bruit.  No HSM or HJR Distal pulses intact with no bruits No edema Neuro non-focal Skin warm and dry No muscular weakness    ASSESSMENT & PLAN: 1. Chronic diastolic HF with R>>L EF 59-93% continue current meds  2. AKI on CKD stage III last Cr 09/03/17 1.5 varies with level of diuresis  3. LE wounds- Followed at the wound center.  4.  H/o COPD 5. H/o breast cancer s/p chemo 2000 may contribute to DCM 6. Chronic A fib- Rate control is poor add lopressor 25 bid f/u NP/PA 2-3 weeks to titrated to 50 bid if needed . On Eliquis twice a day.  CHA2DS2-VASc Score is 6. Low dose due to age/Cr 7. Falls- PT/OT now home  8. LUE DVT see above anticoagulated  9. Thyroid f/u [name] recent TSH/T4 normal    Code status DNR/DNI per Dr Dorothe Pea [name],MD 10:50 AM

## 2018-01-22 DIAGNOSIS — D631 Anemia in chronic kidney disease: Secondary | ICD-10-CM | POA: Diagnosis not present

## 2018-01-22 DIAGNOSIS — E785 Hyperlipidemia, unspecified: Secondary | ICD-10-CM | POA: Diagnosis not present

## 2018-01-22 DIAGNOSIS — I509 Heart failure, unspecified: Secondary | ICD-10-CM | POA: Diagnosis not present

## 2018-01-22 DIAGNOSIS — N2581 Secondary hyperparathyroidism of renal origin: Secondary | ICD-10-CM | POA: Diagnosis not present

## 2018-01-22 DIAGNOSIS — C50919 Malignant neoplasm of unspecified site of unspecified female breast: Secondary | ICD-10-CM | POA: Diagnosis not present

## 2018-01-22 DIAGNOSIS — I4891 Unspecified atrial fibrillation: Secondary | ICD-10-CM | POA: Diagnosis not present

## 2018-01-22 DIAGNOSIS — I872 Venous insufficiency (chronic) (peripheral): Secondary | ICD-10-CM | POA: Diagnosis not present

## 2018-01-22 DIAGNOSIS — I129 Hypertensive chronic kidney disease with stage 1 through stage 4 chronic kidney disease, or unspecified chronic kidney disease: Secondary | ICD-10-CM | POA: Diagnosis not present

## 2018-01-22 DIAGNOSIS — E0590 Thyrotoxicosis, unspecified without thyrotoxic crisis or storm: Secondary | ICD-10-CM | POA: Diagnosis not present

## 2018-01-22 DIAGNOSIS — N184 Chronic kidney disease, stage 4 (severe): Secondary | ICD-10-CM | POA: Diagnosis not present

## 2018-01-23 DIAGNOSIS — D485 Neoplasm of uncertain behavior of skin: Secondary | ICD-10-CM | POA: Diagnosis not present

## 2018-01-23 DIAGNOSIS — C44529 Squamous cell carcinoma of skin of other part of trunk: Secondary | ICD-10-CM | POA: Diagnosis not present

## 2018-01-23 DIAGNOSIS — L820 Inflamed seborrheic keratosis: Secondary | ICD-10-CM | POA: Diagnosis not present

## 2018-01-23 DIAGNOSIS — L400 Psoriasis vulgaris: Secondary | ICD-10-CM | POA: Diagnosis not present

## 2018-02-08 ENCOUNTER — Telehealth: Payer: Self-pay | Admitting: Cardiovascular Disease

## 2018-02-08 NOTE — Telephone Encounter (Signed)
Called patient about her message. Patient complaining of SOB with activity and swelling in her ankles. Patient stated this all start when she was put on Metoprolol 25 mg BID. Patient has an up coming appointment on 02/18/18 to see a PA. Informed patient that Dr. Johnsie Cancel put her on the metoprolol because her HR was 157 at her last office visit. Patient had poor rate control with chronic A. FIB. Patient stated the only reason why her HR was up because she was stressed out due to leaving alone and recently having her sister-in-law pass. Patient states she is not as stressed and she just took a xanax. Consulted Dr. Lovena Le, DOD, he recommends patient to take an extra Torsemide today and come in for an EKG on Monday as a nurse visit. Patient agreed to plan and will come in on Monday.

## 2018-02-08 NOTE — Telephone Encounter (Signed)
New Message   Pt c/o medication issue:  1. Name of Medication: metoprolol tartrate (LOPRESSOR) 25 MG tablet  2. How are you currently taking this medication (dosage and times per day)? Take 1 tablet (25 mg total) by mouth 2 (two) times daily  3. Are you having a reaction (difficulty breathing--STAT)? yes  4. What is your medication issue? Pt states that the medication is causing her SOB when moving around, swollen ankles and when she bends over she looses control of her bladder

## 2018-02-11 ENCOUNTER — Ambulatory Visit (INDEPENDENT_AMBULATORY_CARE_PROVIDER_SITE_OTHER): Payer: Medicare Other

## 2018-02-11 VITALS — BP 120/82 | HR 84 | Resp 20 | Wt 121.0 lb

## 2018-02-11 DIAGNOSIS — I482 Chronic atrial fibrillation, unspecified: Secondary | ICD-10-CM

## 2018-02-11 DIAGNOSIS — R0602 Shortness of breath: Secondary | ICD-10-CM | POA: Diagnosis not present

## 2018-02-11 MED ORDER — CARVEDILOL 3.125 MG PO TABS: 3.1250 mg | ORAL_TABLET | Freq: Two times a day (BID) | ORAL | 11 refills | Status: DC

## 2018-02-11 NOTE — Progress Notes (Addendum)
1.) Reason for visit: EKG  2.) Name of MD requesting visit: Dr. Lovena Le as DOD, day of patient's phone call.  3.) H&P: Patient is an 84 year old female with a history of A. FIB, Chronic systolic heart failure,  Nonischemic cardiomyopathy, HTN, COPD, CKD, and anxiety. Last echo showed EF 45 to 50% on 12/15/15. Patient called on 02/08/18 about SOB, left ankle swelling, and unable to control bladder since starting Metoprolol on 01/21/18 at office visit due to poor A. FIB rate control HR 157 at that time. Patient takes torsemide 20 mg BID, and was instructed to take an extra torsemide and have a nurse visit for EKG on Monday (02/11/18) by Dr. Lovena Le, DOD on 02/08/18.  4.) ROS related to problem: Patient's vital signs BP 120/82, HR 84, O2 95% on room air, and Resp. 20. Lungs sounds are clear. No swelling in BLE, multiple bruising on BUE and BLE. EKG showed Atrial fibrillation HR 73. Will have EKG scanned into patient's chart for Dr. Johnsie Cancel to review. Patient wants to have her metoprolol cut back due to bladder control issues and SOB. Patient stated all her symptoms started after starting metoprolol BID. Patient stated she cannot do anything (take out trash, go walking, etc.) without having an issue controlling her bladder and being SOB. Explained to patient that the metoprolol is helping to control her heart rate, which has improved. Patient stated her ankle swelling and SOB did improve with the extra torsemide she took on Friday, but she does not want to have to keep taking an extra Torsemide when she has swelling. Informed patient at this time her medications are staying the same and there are no changes.   5.) Assessment and plan per MD: Dr. Acie Fredrickson (DOD) reviewed patient's EKG and assessment. Dr. Acie Fredrickson recommends patient have a BMET and Dr. Johnsie Cancel to review and make changes, that this is a chronic issue. Patient has a f/u appointment with PA on 02/18/18. Patient sees her PCP, Dr. Alain Marion tomorrow. Encouraged  patient to discuss her bladder issues with Dr. Alain Marion and Dr. Justin Mend her nephrologist. Will send message to Dr. Johnsie Cancel for advisement.    Per Dr. Johnsie Cancel, Continue demedex will see what BMET shows if she does not like metoprolol can try coreg 3.125 bid. Called patient with changes. Patient will stop metoprolol and start coreg tomorrow.

## 2018-02-11 NOTE — Addendum Note (Signed)
Addended by: Aris Georgia, Tedi Hughson L on: 02/11/2018 02:44 PM   Modules accepted: Orders

## 2018-02-11 NOTE — Patient Instructions (Signed)
Medication Instructions:  Your physician recommends that you continue on your current medications as directed. Please refer to the Current Medication list given to you today.  Labwork: Your physician recommends that you have lab work today- BMET.   Testing/Procedures: NONE  Follow-Up: Your physician wants you to follow-up as scheduled.  If you need a refill on your cardiac medications before your next appointment, please call your pharmacy.

## 2018-02-12 ENCOUNTER — Ambulatory Visit (INDEPENDENT_AMBULATORY_CARE_PROVIDER_SITE_OTHER): Payer: Medicare Other | Admitting: Internal Medicine

## 2018-02-12 ENCOUNTER — Encounter: Payer: Self-pay | Admitting: Internal Medicine

## 2018-02-12 ENCOUNTER — Telehealth: Payer: Self-pay | Admitting: Cardiovascular Disease

## 2018-02-12 VITALS — BP 116/74 | HR 141 | Temp 97.8°F | Ht 63.0 in | Wt 122.0 lb

## 2018-02-12 DIAGNOSIS — I10 Essential (primary) hypertension: Secondary | ICD-10-CM | POA: Diagnosis not present

## 2018-02-12 DIAGNOSIS — F4321 Adjustment disorder with depressed mood: Secondary | ICD-10-CM | POA: Diagnosis not present

## 2018-02-12 DIAGNOSIS — D631 Anemia in chronic kidney disease: Secondary | ICD-10-CM

## 2018-02-12 DIAGNOSIS — E162 Hypoglycemia, unspecified: Secondary | ICD-10-CM | POA: Diagnosis not present

## 2018-02-12 DIAGNOSIS — I5042 Chronic combined systolic (congestive) and diastolic (congestive) heart failure: Secondary | ICD-10-CM | POA: Diagnosis not present

## 2018-02-12 DIAGNOSIS — I481 Persistent atrial fibrillation: Secondary | ICD-10-CM

## 2018-02-12 DIAGNOSIS — I480 Paroxysmal atrial fibrillation: Secondary | ICD-10-CM | POA: Diagnosis not present

## 2018-02-12 DIAGNOSIS — N184 Chronic kidney disease, stage 4 (severe): Secondary | ICD-10-CM | POA: Diagnosis not present

## 2018-02-12 DIAGNOSIS — R600 Localized edema: Secondary | ICD-10-CM | POA: Diagnosis not present

## 2018-02-12 DIAGNOSIS — I4819 Other persistent atrial fibrillation: Secondary | ICD-10-CM

## 2018-02-12 LAB — BASIC METABOLIC PANEL
BUN/Creatinine Ratio: 18 (ref 12–28)
BUN: 26 mg/dL (ref 8–27)
CALCIUM: 10.1 mg/dL (ref 8.7–10.3)
CO2: 29 mmol/L (ref 20–29)
CREATININE: 1.45 mg/dL — AB (ref 0.57–1.00)
Chloride: 98 mmol/L (ref 96–106)
GFR calc Af Amer: 38 mL/min/{1.73_m2} — ABNORMAL LOW (ref >59)
GFR calc non Af Amer: 33 mL/min/{1.73_m2} — ABNORMAL LOW (ref >59)
GLUCOSE: 53 mg/dL — AB (ref 65–99)
Potassium: 3.8 mmol/L (ref 3.5–5.2)
Sodium: 144 mmol/L (ref 134–144)

## 2018-02-12 MED ORDER — ALPRAZOLAM 0.5 MG PO TABS: 0.5000 mg | ORAL_TABLET | Freq: Two times a day (BID) | ORAL | 3 refills | Status: DC | PRN

## 2018-02-12 NOTE — Progress Notes (Signed)
Subjective:  Patient ID: Mia Contreras, female    DOB: [birth year]  Age: 84 y.o. MRN: [id number]  CC: No chief complaint on file.   HPI Mia Contreras presents for CHF, A fib, anxiety. C/o OAB C/o leg swelling at times. Coreg dose was reduced   Outpatient Medications Prior to Visit  Medication Sig Dispense Refill  . Acetaminophen (TYLENOL ARTHRITIS PAIN PO) Take 1 tablet by mouth daily as needed (pain).    Marland Kitchen ALPRAZolam (XANAX) 0.5 MG tablet Take 1 tablet (0.5 mg total) by mouth 2 (two) times daily as needed for anxiety. 60 tablet 3  . apixaban (ELIQUIS) 2.5 MG TABS tablet Take 1 tablet (2.5 mg total) by mouth 2 (two) times daily. Needs office visit for additional refills [phone] 60 tablet 0  . carvedilol (COREG) 3.125 MG tablet Take 1 tablet (3.125 mg total) by mouth 2 (two) times daily with a meal. 60 tablet 11  . coal tar (NEUTROGENA T-GEL) 0.5 % shampoo Apply topically 3 (three) times a week.    Marland Kitchen ketoconazole (NIZORAL) 2 % shampoo Apply 1 application topically 2 (two) times a week.    . torsemide (DEMADEX) 20 MG tablet Take 2 tablets (40 mg total) by mouth daily. 180 tablet 6  . vitamin C (ASCORBIC ACID) 500 MG tablet Take 1 tablet (500 mg total) by mouth daily. 100 tablet 3  . Calcium Carbonate-Vitamin D (CALCIUM 600+D) 600-400 MG-UNIT per tablet Take 2 tablets by mouth daily.     . coal tar (NEUTROGENA T-GEL) 0.5 % shampoo Apply topically at bedtime as needed.     No facility-administered medications prior to visit.     ROS: Review of Systems  Constitutional: Positive for fatigue. Negative for activity change, appetite change, chills and unexpected weight change.  HENT: Negative for congestion, mouth sores and sinus pressure.   Eyes: Negative for visual disturbance.  Respiratory: Negative for cough and chest tightness.   Cardiovascular: Positive for leg swelling.  Gastrointestinal: Negative for abdominal pain and nausea.  Genitourinary: Positive for frequency and  urgency. Negative for difficulty urinating and vaginal pain.  Musculoskeletal: Negative for back pain and gait problem.  Skin: Negative for pallor and rash.  Neurological: Negative for dizziness, tremors, weakness, numbness and headaches.  Psychiatric/Behavioral: Positive for dysphoric mood. Negative for confusion, sleep disturbance and suicidal ideas.    Objective:  BP 116/74 (BP Location: Left Arm, Patient Position: Sitting, Cuff Size: Normal)   Pulse (!) 141   Temp 97.8 F (36.6 C) (Oral)   Ht 5\' 3"  (1.6 m)   Wt 122 lb (55.3 kg)   SpO2 98%   BMI 21.61 kg/m   BP Readings from Last 3 Encounters:  02/12/18 116/74  02/11/18 120/82  01/21/18 118/72    Wt Readings from Last 3 Encounters:  02/12/18 122 lb (55.3 kg)  02/11/18 121 lb (54.9 kg)  01/21/18 118 lb 3.2 oz (53.6 kg)    Physical Exam  Constitutional: She appears well-developed. No distress.  HENT:  Head: Normocephalic.  Right Ear: External ear normal.  Left Ear: External ear normal.  Nose: Nose normal.  Mouth/Throat: Oropharynx is clear and moist.  Eyes: Pupils are equal, round, and reactive to light. Conjunctivae are normal. Right eye exhibits no discharge. Left eye exhibits no discharge.  Neck: Normal range of motion. Neck supple. No JVD present. No tracheal deviation present. No thyromegaly present.  Cardiovascular: Normal heart sounds.  Pulmonary/Chest: No stridor. No respiratory distress. She has no wheezes.  Abdominal: Soft.  Bowel sounds are normal. She exhibits no distension and no mass. There is no tenderness. There is no rebound and no guarding.  Musculoskeletal: She exhibits no edema or tenderness.  Lymphadenopathy:    She has no cervical adenopathy.  Neurological: She displays normal reflexes. No cranial nerve deficit. She exhibits normal muscle tone. Coordination normal.  Skin: No rash noted. No erythema.  Psychiatric: She has a normal mood and affect. Her behavior is normal. Judgment and thought  content normal.  irreg rhythm ataxic  Lab Results  Component Value Date   WBC 8.6 08/24/2017   HGB 13.4 08/24/2017   HCT 40.2 08/24/2017   PLT 285.0 08/24/2017   GLUCOSE 53 (L) 02/11/2018   CHOL 130 11/04/2015   TRIG 77.0 11/04/2015   HDL 54.50 11/04/2015   LDLCALC 60 11/04/2015   ALT 14 10/15/2017   AST 26 10/15/2017   NA 144 02/11/2018   K 3.8 02/11/2018   CL 98 02/11/2018   CREATININE 1.45 (H) 02/11/2018   BUN 26 02/11/2018   CO2 29 02/11/2018   TSH 1.59 11/28/2017   INR 0.95 10/25/2011   HGBA1C 5.8 02/06/2014   MICROALBUR 0.6 03/23/2011    No results found.  Assessment & Plan:   There are no diagnoses linked to this encounter.   No orders of the defined types were placed in this encounter.    Follow-up: No follow-ups on file.  Walker Kehr, MD

## 2018-02-12 NOTE — Assessment & Plan Note (Signed)
Xarelto

## 2018-02-12 NOTE — Assessment & Plan Note (Signed)
12-14-2017 sister in law died in [place] - discussed

## 2018-02-12 NOTE — Assessment & Plan Note (Signed)
CBC

## 2018-02-12 NOTE — Assessment & Plan Note (Signed)
Eliquis 

## 2018-02-12 NOTE — Assessment & Plan Note (Signed)
Demadex, Coreg

## 2018-02-12 NOTE — Assessment & Plan Note (Signed)
BMET Elev Ca - d/c Calcium po

## 2018-02-12 NOTE — Assessment & Plan Note (Signed)
Torsemide, D/c spironolactone

## 2018-02-12 NOTE — Telephone Encounter (Signed)
New Message     Patient is returning call in reference to lab results. She said she did pull the information from my chart. She was wondering if you sent the kidney results to Dr. Justin Mend.

## 2018-02-12 NOTE — Assessment & Plan Note (Signed)
Coreg low dose Torsemide

## 2018-02-12 NOTE — Telephone Encounter (Signed)
Called patient and informed her that her lab work was sent to Dr. Justin Mend as well. Patient verbalized understanding.

## 2018-02-18 ENCOUNTER — Ambulatory Visit (INDEPENDENT_AMBULATORY_CARE_PROVIDER_SITE_OTHER): Payer: Medicare Other | Admitting: Physician Assistant

## 2018-02-18 ENCOUNTER — Encounter: Payer: Self-pay | Admitting: Physician Assistant

## 2018-02-18 VITALS — BP 118/80 | HR 76 | Ht 63.0 in | Wt 120.0 lb

## 2018-02-18 DIAGNOSIS — N183 Chronic kidney disease, stage 3 unspecified: Secondary | ICD-10-CM

## 2018-02-18 DIAGNOSIS — I482 Chronic atrial fibrillation, unspecified: Secondary | ICD-10-CM

## 2018-02-18 DIAGNOSIS — I5022 Chronic systolic (congestive) heart failure: Secondary | ICD-10-CM | POA: Diagnosis not present

## 2018-02-18 NOTE — Patient Instructions (Addendum)
Medication Instructions:  Your physician recommends that you continue on your current medications as directed. Please refer to the Current Medication list given to you today.   Labwork: None ordered  Testing/Procedures: None ordered  Follow-Up: Your physician wants you to follow-up in: September WITH DR. Johnsie Cancel (ALREADY SCHEDULED) Any Other Special Instructions Will Be Listed Below (If Applicable).     If you need a refill on your cardiac medications before your next appointment, please call your pharmacy.

## 2018-02-18 NOTE — Progress Notes (Signed)
Cardiology Office Note    Date:  02/18/2018   ID:  Mia Contreras, DOB [birth year], MRN [id number]  PCP:  Cassandria Anger, MD  Primary Cardiologist: Dr Johnsie Cancel Primary HF: Dr. Haroldine Laws  Chief Complaint: LE edema follow up  History of Present Illness:   Mia Contreras is a 84 y.o. female with hx of nonischemic cardiomyopathy, systolic CHF EF 85-02% in 2016 (echo 45-50% in 11/2015), PACs, L Breast cancer in 2000 with surgery, hyperthyroidism and chemo, CKD (followed by Dr. Justin Mend), Persistent atrial fibrillation, HTN and HLD presents for LE edema evaluation.    FH: Brother also has CAD and afib, Mother had CABG but did well, Father passed away from MI at age 44 thought to be 2/2 to heavy ETOH use.  SH:  Pt has a distant history of smoking. Pt has 50-60 pack years up to 2ppd. Quit in 1982.   Admitted 4/25 -12/19/15 with A/C combined CHF and AKI.  Discharge weight 134 lbs.   Admitted 6/7 -> 02/07/16 with volume overload. Diuretic changed to deedex  Discharge weight 125 lbs.   Was admitted  (10/17) with severe symptomatic hyperkalemia (K > 7.5) and ARF (Creatinine 5.3). Was initially bradycardic with HRs in 20s. Spiro and potassium stopped. Treated with fluids and bicarb and recovered. On d/c 10/22 K 4.4 Cr 2.2 Discussed Palliative Care  B-blocker stopped.   Last seen by Dr. Johnsie Cancel 01/21/18. Noted poorly control HR. Added lopressor 25mg  BID with improved rate.  However patient called on 02/08/18 about SOB, left ankle swelling, and unable to control bladder since starting Metoprolol. Changed to coreg 3.142mb BID. Advised to take extra torsemide. Scr was stable.   Here today for follow up.  Edema has resolved.  Weight stable at 120 pounds at home.  She has bladder control issue when bend over.  Heart rate is stable on beta-blocker.  She has intermittent palpitations during anxiety attack.  Improves with PRN Xanax.  Easy bruising on anticoagulation.  Denies dizziness, chest pain, shortness of  breath, orthopnea, PND, syncope, melena or blood in her stool or urine.  Recently noted blood sugar of 53 on lab work.  His primary care has advised her to increase carbohydrate intake.  I have at length discussion regarding her diet.  Encourage snack between meals.  She will try protein shake.  Past Medical History:  Diagnosis Date  . AKI (acute kidney injury) ([place]) 01/2016  . ANEMIA-NOS 08/20/2007  . ANXIETY 03/16/2007  . BREAST CANCER, HX OF 03/16/2007  . CARPAL TUNNEL SYNDROME, RIGHT 01/11/2008  . Cataract    floaters  . CELLULITIS, LEG, RIGHT 08/20/2007  . Collagenous colitis   . COPD (chronic obstructive pulmonary disease) ([place])   . DIVERTICULOSIS, COLON 08/20/2007  . GOITER, MULTINODULAR 05/06/2010   Dr [name]  . HYPERLIPIDEMIA 08/20/2007  . HYPERTENSION 03/16/2007  . HYPERTHYROIDISM 05/23/2010  . IBS (irritable bowel syndrome)   . OSTEOARTHRITIS 03/16/2007  . OSTEOPOROSIS 08/20/2007  . Pneumonia 07/2011   took abx for several weeks  . PREMATURE ATRIAL CONTRACTIONS 09/21/2010  . SCOLIOSIS 01/11/2008  . SKIN CANCER, HX OF 11/12/2008    R cheek 2010, L Cheek 2011 Dr. Tonia Brooms  . Thyroid nodule   . VENOUS INSUFFICIENCY 09/05/2007    Past Surgical History:  Procedure Laterality Date  . ABDOMINAL HYSTERECTOMY    . APPENDECTOMY  1972  . BREAST LUMPECTOMY Left 2001  . CARPAL TUNNEL RELEASE    . CATARACT EXTRACTION    . EYE SURGERY  cataract ext  . HERNIA REPAIR    . MASTECTOMY, PARTIAL Left   . SKIN CANCER EXCISION     face-Dr. Bing Plume  . TOTAL KNEE ARTHROPLASTY    . TOTAL KNEE ARTHROPLASTY Right 2010   Dr Para March  . TOTAL KNEE ARTHROPLASTY  10/30/2011   Procedure: TOTAL KNEE ARTHROPLASTY;  Surgeon: Lorn Junes, MD;  Location: [facility];  Service: Orthopedics;  Laterality: Left;     . XRT     chemo, surgery    Current Medications: Prior to Admission medications   Medication Sig Start Date End Date Taking? Authorizing Provider  Acetaminophen (TYLENOL ARTHRITIS PAIN PO)  Take 1 tablet by mouth daily as needed (pain).    [provider]  ALPRAZolam Duanne Moron) 0.5 MG tablet Take 1 tablet (0.5 mg total) by mouth 2 (two) times daily as needed for anxiety. 02/12/18   Plotnikov, Evie Lacks, MD  apixaban (ELIQUIS) 2.5 MG TABS tablet Take 1 tablet (2.5 mg total) by mouth 2 (two) times daily. Needs office visit for additional refills [phone] 12/31/17   Bensimhon, Shaune Pascal, MD  carvedilol (COREG) 3.125 MG tablet Take 1 tablet (3.125 mg total) by mouth 2 (two) times daily with a meal. 02/11/18   Josue Hector, MD  coal tar (NEUTROGENA T-GEL) 0.5 % shampoo Apply topically 3 (three) times a week.    [provider]  ketoconazole (NIZORAL) 2 % shampoo Apply 1 application topically 2 (two) times a week.    [provider]  torsemide (DEMADEX) 20 MG tablet Take 2 tablets (40 mg total) by mouth daily. 03/06/17   Plotnikov, Evie Lacks, MD  vitamin C (ASCORBIC ACID) 500 MG tablet Take 1 tablet (500 mg total) by mouth daily. 03/14/16   Plotnikov, Evie Lacks, MD    Allergies:   Cefuroxime; Celecoxib; Deltasone [prednisone]; Ranitidine; Spironolactone; Tapazole [methimazole]; and Tramadol hcl   Social History   Socioeconomic History  . Marital status: Single    Spouse name: Not on file  . Number of children: Not on file  . Years of education: Not on file  . Highest education level: Not on file  Occupational History  . Not on file  Social Needs  . Financial resource strain: Not on file  . Food insecurity:    Worry: Not on file    Inability: Not on file  . Transportation needs:    Medical: Not on file    Non-medical: Not on file  Tobacco Use  . Smoking status: Former Smoker    Last attempt to quit: 12/20/1980    Years since quitting: 37.1  . Smokeless tobacco: Never Used  Substance and Sexual Activity  . Alcohol use: Yes    Comment: socially  . Drug use: No  . Sexual activity: Not Currently  Lifestyle  . Physical activity:    Days per week: Not  on file    Minutes per session: Not on file  . Stress: Not on file  Relationships  . Social connections:    Talks on phone: Not on file    Gets together: Not on file    Attends religious service: Not on file    Active member of club or organization: Not on file    Attends meetings of clubs or organizations: Not on file    Relationship status: Not on file  Other Topics Concern  . Not on file  Social History Narrative  . Not on file     Family History:  The patient's family  history includes Alcohol abuse in her father; Alzheimer's disease in her mother; Dementia in her mother; Diabetes in her other; Heart attack in her father; Heart disease in her mother; Hypertension in her mother; Mental retardation in her mother; Prostate cancer in her brother.   ROS:   Please see the history of present illness.    ROS All other systems reviewed and are negative.   PHYSICAL EXAM:   VS:  BP 118/80   Pulse 76   Ht 5\' 3"  (1.6 m)   Wt 120 lb (54.4 kg)   BMI 21.26 kg/m    GEN: Well nourished, well developed, in no acute distress  HEENT: normal  Neck: no JVD, carotid bruits, or masses Cardiac: RRR; no murmurs, rubs, or gallops,no edema  Respiratory:  clear to auscultation bilaterally, normal work of breathing GI: soft, nontender, nondistended, + BS MS: no deformity or atrophy  Skin: warm and dry, no rash Neuro:  Alert and Oriented x 3, Strength and sensation are intact Psych: euthymic mood, full affect  Wt Readings from Last 3 Encounters:  02/18/18 120 lb (54.4 kg)  02/12/18 122 lb (55.3 kg)  02/11/18 121 lb (54.9 kg)      Studies/Labs Reviewed:   EKG:  EKG is not ordered today.    Recent Labs: 08/24/2017: Hemoglobin 13.4; Platelets 285.0 10/15/2017: ALT 14 11/28/2017: TSH 1.59 02/11/2018: BUN 26; Creatinine, Ser 1.45; Potassium 3.8; Sodium 144   Lipid Panel    Component Value Date/Time   CHOL 130 11/04/2015 1016   TRIG 77.0 11/04/2015 1016   TRIG 72 08/01/2006 0841   HDL 54.50  11/04/2015 1016   CHOLHDL 2 11/04/2015 1016   VLDL 15.4 11/04/2015 1016   LDLCALC 60 11/04/2015 1016    Additional studies/ records that were reviewed today include:   Echocardiogram: 11/2015 Study Conclusions  - Left ventricle: The cavity size was normal. Wall thickness was   normal. Systolic function was mildly reduced. The estimated   ejection fraction was in the range of 45% to 50%. Diffuse   hypokinesis. - Ventricular septum: The contour showed diastolic flattening and   systolic flattening. These changes are consistent with RV volume   and pressure overload. - Mitral valve: Calcified annulus. Mildly thickened leaflets .   There was mild regurgitation. - Left atrium: The atrium was moderately dilated. - Right ventricle: The cavity size was severely dilated. Systolic   function was moderately to severely reduced. - Right atrium: The atrium was severely dilated. - Tricuspid valve: There was malcoaptation of the valve leaflets.   There was severe regurgitation directed centrally. - Pulmonary arteries: PA peak pressure: 77 mm Hg (S).  Impressions:  - Compared to last year&'s study, there is worsening pulmonary   hypertension and drastic worsening of tricuspid insufficiency and   right ventricular dysfuntion.    ASSESSMENT & PLAN:    1. Persistent atrial fibrillation  Rate is stable on low-dose Coreg.  Patient has intermittent palpitation only when under a lot of stress or anxiety.  This relieves with taking Xanax.  I have advised to take extra Coreg if palpitation persist.  If severe symptomatic call EMS.  Continue Eliquis for anticoagulation.    2. Chronic systolic heart failure -Euvolemic.  Continue beta-blocker and torsemide at current dose.  3.  Chronic kidney disease -Renal function stable on recent lab work.  4.  Bladder issue -She has difficult to control her bladder when she bends over.  She also has a history of hernia.  Declined  urology referral. She will  discuss with PCP.   Medication Adjustments/Labs and Tests Ordered: Current medicines are reviewed at length with the patient today.  Concerns regarding medicines are outlined above.  Medication changes, Labs and Tests ordered today are listed in the Patient Instructions below. Patient Instructions  Medication Instructions:  Your physician recommends that you continue on your current medications as directed. Please refer to the Current Medication list given to you today.   Labwork: None ordered  Testing/Procedures: None ordered  Follow-Up: Your physician wants you to follow-up in: September WITH DR. Johnsie Cancel (ALREADY SCHEDULED) Any Other Special Instructions Will Be Listed Below (If Applicable).     If you need a refill on your cardiac medications before your next appointment, please call your pharmacy.      Jarrett Soho, [place]  02/18/2018 12:11 PM    [facility] [place], [place],   [address] Phone: [phone]; Fax: [phone]

## 2018-03-05 ENCOUNTER — Encounter: Payer: Self-pay | Admitting: Endocrinology

## 2018-03-05 ENCOUNTER — Ambulatory Visit (INDEPENDENT_AMBULATORY_CARE_PROVIDER_SITE_OTHER): Payer: Medicare Other | Admitting: Endocrinology

## 2018-03-05 VITALS — BP 132/64 | HR 117 | Ht 63.0 in | Wt 124.0 lb

## 2018-03-05 DIAGNOSIS — E0520 Thyrotoxicosis with toxic multinodular goiter without thyrotoxic crisis or storm: Secondary | ICD-10-CM | POA: Diagnosis not present

## 2018-03-05 LAB — T4, FREE: FREE T4: 0.72 ng/dL (ref 0.60–1.60)

## 2018-03-05 LAB — TSH: TSH: 2.12 u[IU]/mL (ref 0.35–4.50)

## 2018-03-05 NOTE — Patient Instructions (Signed)
blood tests are requested for you today.  We'll let you know about the results. If the thyroid has gone high again, I'll request the radioactive iodine for you.  Please come back for a follow-up appointment in 3 months.

## 2018-03-05 NOTE — Progress Notes (Signed)
Subjective:    Patient ID: Mia Contreras, female    DOB: 11/19/1928, 84 y.o.   MRN: [id number]  HPI Pt returns for f/u of hyperthyroidism (due to large multinodular goiter; dx'ed 2008; bx then showed HYPERPLASTICNODULE. in 2011, pt had RAI for hyperthyroidism; she became euthyroid, last [name] in early 2015 was unchanged; in 2017, hyperthyroidism recurred; plan was for repeat RAI, but RAI uptake was low; pt says she does not take any iodine-containing products, so she was rx'ed tapazole; this was changed to PTU, due to a rash; the rash was since determined to be psoriasis; she stopped PTU in 1/19, due to ongoing rash).  pt states she feels well in general.  Itching is much better.  Past Medical History:  Diagnosis Date  . AKI (acute kidney injury) ([place]) 01/2016  . ANEMIA-NOS 08/20/2007  . ANXIETY 03/16/2007  . BREAST CANCER, HX OF 03/16/2007  . CARPAL TUNNEL SYNDROME, RIGHT 01/11/2008  . Cataract    floaters  . CELLULITIS, LEG, RIGHT 08/20/2007  . Collagenous colitis   . COPD (chronic obstructive pulmonary disease) ([facility])   . DIVERTICULOSIS, COLON 08/20/2007  . GOITER, MULTINODULAR 05/06/2010   Dr [name]  . HYPERLIPIDEMIA 08/20/2007  . HYPERTENSION 03/16/2007  . HYPERTHYROIDISM 05/23/2010  . IBS (irritable bowel syndrome)   . OSTEOARTHRITIS 03/16/2007  . OSTEOPOROSIS 08/20/2007  . Pneumonia 07/2011   took abx for several weeks  . PREMATURE ATRIAL CONTRACTIONS 09/21/2010  . SCOLIOSIS 01/11/2008  . SKIN CANCER, HX OF 11/12/2008    R cheek 2010, L Cheek 2011 Dr. Tonia Brooms  . Thyroid nodule   . VENOUS INSUFFICIENCY 09/05/2007    Past Surgical History:  Procedure Laterality Date  . ABDOMINAL HYSTERECTOMY    . APPENDECTOMY  1972  . BREAST LUMPECTOMY Left 2001  . CARPAL TUNNEL RELEASE    . CATARACT EXTRACTION    . EYE SURGERY     cataract ext  . HERNIA REPAIR    . MASTECTOMY, PARTIAL Left   . SKIN CANCER EXCISION     face-Dr. Bing Plume  . TOTAL KNEE ARTHROPLASTY    . TOTAL KNEE ARTHROPLASTY  Right 2010   Dr Para March  . TOTAL KNEE ARTHROPLASTY  10/30/2011   Procedure: TOTAL KNEE ARTHROPLASTY;  Surgeon: Lorn Junes, MD;  Location: [facility];  Service: Orthopedics;  Laterality: Left;     . XRT     chemo, surgery    Social History   Socioeconomic History  . Marital status: Single    Spouse name: Not on file  . Number of children: Not on file  . Years of education: Not on file  . Highest education level: Not on file  Occupational History  . Not on file  Social Needs  . Financial resource strain: Not on file  . Food insecurity:    Worry: Not on file    Inability: Not on file  . Transportation needs:    Medical: Not on file    Non-medical: Not on file  Tobacco Use  . Smoking status: Former Smoker    Last attempt to quit: 12/20/1980    Years since quitting: 37.2  . Smokeless tobacco: Never Used  Substance and Sexual Activity  . Alcohol use: Yes    Comment: socially  . Drug use: No  . Sexual activity: Not Currently  Lifestyle  . Physical activity:    Days per week: Not on file    Minutes per session: Not on file  . Stress: Not on file  Relationships  . Social connections:    Talks on phone: Not on file    Gets together: Not on file    Attends religious service: Not on file    Active member of club or organization: Not on file    Attends meetings of clubs or organizations: Not on file    Relationship status: Not on file  . Intimate partner violence:    Fear of current or ex partner: Not on file    Emotionally abused: Not on file    Physically abused: Not on file    Forced sexual activity: Not on file  Other Topics Concern  . Not on file  Social History Narrative  . Not on file    Current Outpatient Medications on File Prior to Visit  Medication Sig Dispense Refill  . Acetaminophen (TYLENOL ARTHRITIS PAIN PO) Take 1 tablet by mouth daily as needed (pain).    Marland Kitchen ALPRAZolam (XANAX) 0.5 MG tablet Take 1 tablet (0.5 mg total) by mouth 2 (two) times daily as  needed for anxiety. 60 tablet 3  . apixaban (ELIQUIS) 2.5 MG TABS tablet Take 1 tablet (2.5 mg total) by mouth 2 (two) times daily. Needs office visit for additional refills [phone] 60 tablet 0  . carvedilol (COREG) 3.125 MG tablet Take 1 tablet (3.125 mg total) by mouth 2 (two) times daily with a meal. 60 tablet 11  . coal tar (NEUTROGENA T-GEL) 0.5 % shampoo Apply topically 3 (three) times a week.    Marland Kitchen ketoconazole (NIZORAL) 2 % shampoo Apply 1 application topically 2 (two) times a week.    . torsemide (DEMADEX) 20 MG tablet Take 2 tablets (40 mg total) by mouth daily. 180 tablet 6  . vitamin C (ASCORBIC ACID) 500 MG tablet Take 1 tablet (500 mg total) by mouth daily. 100 tablet 3   No current facility-administered medications on file prior to visit.     Allergies  Allergen Reactions  . Cefuroxime Diarrhea  . Celecoxib Nausea Only  . Deltasone [Prednisone]     Oral deltasone is causing swelling (can use Depo-Medrol)  . Ranitidine     bloating  . Spironolactone     High K  . Tapazole [Methimazole]     rash  . Tramadol Hcl Nausea Only    Family History  Problem Relation Age of Onset  . Dementia Mother   . Heart disease Mother   . Mental retardation Mother   . Hypertension Mother   . Alzheimer's disease Mother   . Heart attack Father   . Alcohol abuse Father   . Prostate cancer Brother   . Diabetes Other   . Colon cancer Neg Hx   . Anesthesia problems Neg Hx     BP 132/64   Pulse (!) 117   Ht 5\' 3"  (1.6 m)   Wt 124 lb (56.2 kg)   SpO2 92%   BMI 21.97 kg/m    Review of Systems Denies palpitations.      Objective:   Physical Exam VITAL SIGNS:  See vs page GENERAL: no distress Neck: thyroid is approx 5 times normal size (L>>R) is again noted, with irreg surface. HEART:  Regular rate (not tachycardic on exam) and rhythm without murmurs noted. Normal S1,S2.   Neuro: coarse tremor of the hands.   Skin: not diaphoretic.  Gait: normal and steady.   PSYCH:  Alert and well-oriented.  Does not appear anxious nor depressed.       Assessment & Plan:  Hyperthyroid:  recheck today Dyshidrosis: improvement is prob due to summer season AF: we should rx even a slightly suppressed TSH  Patient Instructions  blood tests are requested for you today.  We'll let you know about the results. If the thyroid has gone high again, I'll request the radioactive iodine for you.  Please come back for a follow-up appointment in 3 months.

## 2018-03-12 ENCOUNTER — Other Ambulatory Visit (HOSPITAL_COMMUNITY): Payer: Self-pay | Admitting: *Deleted

## 2018-03-12 ENCOUNTER — Telehealth (HOSPITAL_COMMUNITY): Payer: Self-pay | Admitting: *Deleted

## 2018-03-12 MED ORDER — APIXABAN 2.5 MG PO TABS: 2.5000 mg | ORAL_TABLET | Freq: Two times a day (BID) | ORAL | 11 refills | Status: DC

## 2018-03-12 NOTE — Telephone Encounter (Signed)
Spoke with pt. Pt called stating she gets her eliquis through the company and needed for [name] to call or fax a new prescription. Pt said she was upset and confused because she had been calling the wrong number trying to reach our office. She was concerned that her medication would run out because she only has 1 month of refills left. I assured her I would get the script faxed over. I called [phone] (pateint provided this number) and they told me to fax a new script to [phone]. I have printed the script and will have Dr.Bensimhon sign it tomorrow and then I will fax it.

## 2018-04-17 DIAGNOSIS — H04123 Dry eye syndrome of bilateral lacrimal glands: Secondary | ICD-10-CM | POA: Diagnosis not present

## 2018-04-17 DIAGNOSIS — H35373 Puckering of macula, bilateral: Secondary | ICD-10-CM | POA: Diagnosis not present

## 2018-04-17 DIAGNOSIS — H00025 Hordeolum internum left lower eyelid: Secondary | ICD-10-CM | POA: Diagnosis not present

## 2018-04-17 DIAGNOSIS — Z961 Presence of intraocular lens: Secondary | ICD-10-CM | POA: Diagnosis not present

## 2018-04-17 NOTE — Progress Notes (Signed)
Cardiology Office Note    Date:  04/24/2018   ID:  Mia Contreras, DOB 02-Feb-1929, MRN [id number]  PCP:  Cassandria Anger, MD  Primary Cardiologist: Dr Johnsie Cancel Primary HF: Dr. Haroldine Laws  Chief Complaint: LE edema follow up  History of Present Illness:   Mia Contreras is a 84 y.o. female with hx of nonischemic cardiomyopathy, systolic CHF EF 19-37% in 2016 (echo 45-50% in 11/2015), PACs, L Breast cancer in 2000 with surgery, hyperthyroidism and chemo, CKD (followed by Dr. Justin Mend), Persistent atrial fibrillation, HTN and HLD presents for LE edema evaluation.    FH: Brother also has CAD and afib, Mother had CABG but did well, Father passed away from MI at age 14 thought to be 2/2 to heavy ETOH use.  SH:  Pt has a distant history of smoking. Pt has 50-60 pack years up to 2ppd. Quit in 1982.   Admitted 4/25 -12/19/15 with A/C combined CHF and AKI.  Discharge weight 134 lbs.   Admitted 6/7 -> 02/07/16 with volume overload. Diuretic changed to deedex  Discharge weight 125 lbs.   Was admitted  (10/17) with severe symptomatic hyperkalemia (K > 7.5) and ARF (Creatinine 5.3). Was initially bradycardic with HRs in 20s. Spiro and potassium stopped. Treated with fluids and bicarb and recovered. On d/c 10/22 K 4.4 Cr 2.2 Discussed Palliative Care  B-blocker stopped.   Last seen by Dr. Johnsie Cancel 01/21/18. Noted poorly control HR. Added lopressor 25mg  BID with improved rate.  However patient called on 02/08/18 about SOB, left ankle swelling, and unable to control bladder since starting Metoprolol. Changed to coreg 3.161mb BID. Advised to take extra torsemide. Scr was stable.   Here today for follow up.  Edema has resolved.  Weight stable at 120 pounds at home.  She has bladder control issue when bend over.  Heart rate is stable on beta-blocker.  She has intermittent palpitations during anxiety attack.  Improves with PRN Xanax.  Easy bruising on anticoagulation.  Denies dizziness, chest pain, shortness of  breath, orthopnea, PND, syncope, melena or blood in her stool or urine.  Recently noted blood sugar of 53 on lab work.  His primary care has advised her to increase carbohydrate intake.  I have at length discussion regarding her diet.  Encourage snack between meals.  She will try protein shake.  Concern about new left breast lump she noted. History of left lumpectomy breast cancer with XRT and chemo has had yearly mammograms   Past Medical History:  Diagnosis Date  . AKI (acute kidney injury) ([place]) 01/2016  . ANEMIA-NOS 08/20/2007  . ANXIETY 03/16/2007  . BREAST CANCER, HX OF 03/16/2007  . CARPAL TUNNEL SYNDROME, RIGHT 01/11/2008  . Cataract    floaters  . CELLULITIS, LEG, RIGHT 08/20/2007  . Collagenous colitis   . COPD (chronic obstructive pulmonary disease) ([place])   . DIVERTICULOSIS, COLON 08/20/2007  . GOITER, MULTINODULAR 05/06/2010   Dr [name]  . HYPERLIPIDEMIA 08/20/2007  . HYPERTENSION 03/16/2007  . HYPERTHYROIDISM 05/23/2010  . IBS (irritable bowel syndrome)   . OSTEOARTHRITIS 03/16/2007  . OSTEOPOROSIS 08/20/2007  . Pneumonia 07/2011   took abx for several weeks  . PREMATURE ATRIAL CONTRACTIONS 09/21/2010  . SCOLIOSIS 01/11/2008  . SKIN CANCER, HX OF 11/12/2008    R cheek 2010, L Cheek 2011 Dr. Tonia Brooms  . Thyroid nodule   . VENOUS INSUFFICIENCY 09/05/2007    Past Surgical History:  Procedure Laterality Date  . ABDOMINAL HYSTERECTOMY    . APPENDECTOMY  1972  .  BREAST LUMPECTOMY Left 2001  . CARPAL TUNNEL RELEASE    . CATARACT EXTRACTION    . EYE SURGERY     cataract ext  . HERNIA REPAIR    . MASTECTOMY, PARTIAL Left   . SKIN CANCER EXCISION     face-Dr. Bing Plume  . TOTAL KNEE ARTHROPLASTY    . TOTAL KNEE ARTHROPLASTY Right 2010   Dr Para March  . TOTAL KNEE ARTHROPLASTY  10/30/2011   Procedure: TOTAL KNEE ARTHROPLASTY;  Surgeon: Lorn Junes, MD;  Location: [facility];  Service: Orthopedics;  Laterality: Left;     . XRT     chemo, surgery    Current Medications: Prior  to Admission medications   Medication Sig Start Date End Date Taking? Authorizing Provider  Acetaminophen (TYLENOL ARTHRITIS PAIN PO) Take 1 tablet by mouth daily as needed (pain).    [provider]  ALPRAZolam Duanne Moron) 0.5 MG tablet Take 1 tablet (0.5 mg total) by mouth 2 (two) times daily as needed for anxiety. 02/12/18   Plotnikov, Evie Lacks, MD  apixaban (ELIQUIS) 2.5 MG TABS tablet Take 1 tablet (2.5 mg total) by mouth 2 (two) times daily. Needs office visit for additional refills [phone] 12/31/17   Bensimhon, Shaune Pascal, MD  carvedilol (COREG) 3.125 MG tablet Take 1 tablet (3.125 mg total) by mouth 2 (two) times daily with a meal. 02/11/18   Josue Hector, MD  coal tar (NEUTROGENA T-GEL) 0.5 % shampoo Apply topically 3 (three) times a week.    [provider]  ketoconazole (NIZORAL) 2 % shampoo Apply 1 application topically 2 (two) times a week.    [provider]  torsemide (DEMADEX) 20 MG tablet Take 2 tablets (40 mg total) by mouth daily. 03/06/17   Plotnikov, Evie Lacks, MD  vitamin C (ASCORBIC ACID) 500 MG tablet Take 1 tablet (500 mg total) by mouth daily. 03/14/16   Plotnikov, Evie Lacks, MD    Allergies:   Cefuroxime; Celecoxib; Deltasone [prednisone]; Ranitidine; Spironolactone; Tapazole [methimazole]; and Tramadol hcl   Social History   Socioeconomic History  . Marital status: Single    Spouse name: Not on file  . Number of children: Not on file  . Years of education: Not on file  . Highest education level: Not on file  Occupational History  . Not on file  Social Needs  . Financial resource strain: Not on file  . Food insecurity:    Worry: Not on file    Inability: Not on file  . Transportation needs:    Medical: Not on file    Non-medical: Not on file  Tobacco Use  . Smoking status: Former Smoker    Last attempt to quit: 12/20/1980    Years since quitting: 37.3  . Smokeless tobacco: Never Used  Substance and Sexual Activity  . Alcohol use:  Yes    Comment: socially  . Drug use: No  . Sexual activity: Not Currently  Lifestyle  . Physical activity:    Days per week: Not on file    Minutes per session: Not on file  . Stress: Not on file  Relationships  . Social connections:    Talks on phone: Not on file    Gets together: Not on file    Attends religious service: Not on file    Active member of club or organization: Not on file    Attends meetings of clubs or organizations: Not on file    Relationship status: Not on file  Other Topics  Concern  . Not on file  Social History Narrative  . Not on file     Family History:  The patient's family history includes Alcohol abuse in her father; Alzheimer's disease in her mother; Dementia in her mother; Diabetes in her other; Heart attack in her father; Heart disease in her mother; Hypertension in her mother; Mental retardation in her mother; Prostate cancer in her brother.   ROS:   Please see the history of present illness.    ROS All other systems reviewed and are negative.   PHYSICAL EXAM:   VS:  BP 118/68   Pulse 88   Ht 5\' 3"  (1.6 m)   Wt 116 lb 8 oz (52.8 kg)   SpO2 97%   BMI 20.64 kg/m    GEN: Well nourished, well developed, in no acute distress  HEENT: normal  Neck: no JVD, carotid bruits, or masses Cardiac: RRR; no murmurs, rubs, or gallops,no edema  Respiratory:  clear to auscultation bilaterally, normal work of breathing GI: soft, nontender, nondistended, + BS MS: no deformity or atrophy  Skin: warm and dry, no rash Neuro:  Alert and Oriented x 3, Strength and sensation are intact Psych: euthymic mood, full affect  Wt Readings from Last 3 Encounters:  04/24/18 116 lb 8 oz (52.8 kg)  03/05/18 124 lb (56.2 kg)  02/18/18 120 lb (54.4 kg)      Studies/Labs Reviewed:   EKG:  EKG is not ordered today.    Recent Labs: 08/24/2017: Hemoglobin 13.4; Platelets 285.0 10/15/2017: ALT 14 02/11/2018: BUN 26; Creatinine, Ser 1.45; Potassium 3.8; Sodium  144 03/05/2018: TSH 2.12   Lipid Panel    Component Value Date/Time   CHOL 130 11/04/2015 1016   TRIG 77.0 11/04/2015 1016   TRIG 72 08/01/2006 0841   HDL 54.50 11/04/2015 1016   CHOLHDL 2 11/04/2015 1016   VLDL 15.4 11/04/2015 1016   LDLCALC 60 11/04/2015 1016    Additional studies/ records that were reviewed today include:   Echocardiogram: 11/2015 Study Conclusions  - Left ventricle: The cavity size was normal. Wall thickness was   normal. Systolic function was mildly reduced. The estimated   ejection fraction was in the range of 45% to 50%. Diffuse   hypokinesis. - Ventricular septum: The contour showed diastolic flattening and   systolic flattening. These changes are consistent with RV volume   and pressure overload. - Mitral valve: Calcified annulus. Mildly thickened leaflets .   There was mild regurgitation. - Left atrium: The atrium was moderately dilated. - Right ventricle: The cavity size was severely dilated. Systolic   function was moderately to severely reduced. - Right atrium: The atrium was severely dilated. - Tricuspid valve: There was malcoaptation of the valve leaflets.   There was severe regurgitation directed centrally. - Pulmonary arteries: PA peak pressure: 77 mm Hg (S).  Impressions:  - Compared to last year&'s study, there is worsening pulmonary   hypertension and drastic worsening of tricuspid insufficiency and   right ventricular dysfuntion.    ASSESSMENT & PLAN:    1. Persistent atrial fibrillation stable rate control on low dose coreg No bleeding issues no eliquis  2. Chronic systolic heart failure:  euvolemic functional class one continue current medications  3.  Chronic kidney disease stable on labs done 02/11/18 Cr 1.45 .  4. Called Dr Plotnicov's nurse to get mammogram ordered history of breast cancer and new lump  F/U with cardiology in a year  Jenkins Rouge

## 2018-04-24 ENCOUNTER — Ambulatory Visit (INDEPENDENT_AMBULATORY_CARE_PROVIDER_SITE_OTHER): Payer: Medicare Other | Admitting: Cardiovascular Disease

## 2018-04-24 ENCOUNTER — Encounter: Payer: Self-pay | Admitting: Cardiovascular Disease

## 2018-04-24 ENCOUNTER — Telehealth: Payer: Self-pay

## 2018-04-24 VITALS — BP 118/68 | HR 88 | Ht 63.0 in | Wt 116.5 lb

## 2018-04-24 DIAGNOSIS — I482 Chronic atrial fibrillation, unspecified: Secondary | ICD-10-CM

## 2018-04-24 DIAGNOSIS — Z853 Personal history of malignant neoplasm of breast: Secondary | ICD-10-CM

## 2018-04-24 DIAGNOSIS — I5022 Chronic systolic (congestive) heart failure: Secondary | ICD-10-CM | POA: Diagnosis not present

## 2018-04-24 DIAGNOSIS — N63 Unspecified lump in unspecified breast: Secondary | ICD-10-CM

## 2018-04-24 NOTE — Telephone Encounter (Signed)
Heart care called and pt c/o lump in left breast, diagnostic mammo and [name] ordered for [facility] and faxed

## 2018-04-24 NOTE — Patient Instructions (Addendum)

## 2018-04-29 ENCOUNTER — Other Ambulatory Visit: Payer: Self-pay | Admitting: Internal Medicine

## 2018-04-30 DIAGNOSIS — N6322 Unspecified lump in the left breast, upper inner quadrant: Secondary | ICD-10-CM | POA: Diagnosis not present

## 2018-04-30 LAB — HM MAMMOGRAPHY

## 2018-05-01 ENCOUNTER — Telehealth: Payer: Self-pay | Admitting: Internal Medicine

## 2018-05-01 ENCOUNTER — Other Ambulatory Visit: Payer: Self-pay | Admitting: Internal Medicine

## 2018-05-01 NOTE — Telephone Encounter (Signed)
Insurance has been submitted and verified for Prolia. Patient is responsible for a $0 copay. Due on or after 05/16/18. Called patient on both numbers, unable to leave a message on either. Okay to schedule... Visit Note: Prolia ($0 copay - okay to give per Gareth Eagle) Visit Type: Nurse Provider: Nurse

## 2018-05-02 ENCOUNTER — Encounter: Payer: Self-pay | Admitting: Internal Medicine

## 2018-05-06 ENCOUNTER — Ambulatory Visit (INDEPENDENT_AMBULATORY_CARE_PROVIDER_SITE_OTHER): Payer: Medicare Other | Admitting: Internal Medicine

## 2018-05-06 ENCOUNTER — Encounter: Payer: Self-pay | Admitting: Internal Medicine

## 2018-05-06 ENCOUNTER — Other Ambulatory Visit (INDEPENDENT_AMBULATORY_CARE_PROVIDER_SITE_OTHER): Payer: Medicare Other

## 2018-05-06 VITALS — BP 114/66 | HR 67 | Temp 98.3°F | Ht 63.0 in | Wt 115.0 lb

## 2018-05-06 DIAGNOSIS — Z23 Encounter for immunization: Secondary | ICD-10-CM

## 2018-05-06 DIAGNOSIS — E162 Hypoglycemia, unspecified: Secondary | ICD-10-CM | POA: Diagnosis not present

## 2018-05-06 DIAGNOSIS — N184 Chronic kidney disease, stage 4 (severe): Secondary | ICD-10-CM | POA: Diagnosis not present

## 2018-05-06 DIAGNOSIS — I5022 Chronic systolic (congestive) heart failure: Secondary | ICD-10-CM

## 2018-05-06 DIAGNOSIS — I5042 Chronic combined systolic (congestive) and diastolic (congestive) heart failure: Secondary | ICD-10-CM

## 2018-05-06 DIAGNOSIS — N952 Postmenopausal atrophic vaginitis: Secondary | ICD-10-CM

## 2018-05-06 DIAGNOSIS — I481 Persistent atrial fibrillation: Secondary | ICD-10-CM

## 2018-05-06 DIAGNOSIS — I4819 Other persistent atrial fibrillation: Secondary | ICD-10-CM

## 2018-05-06 DIAGNOSIS — W19XXXA Unspecified fall, initial encounter: Secondary | ICD-10-CM

## 2018-05-06 LAB — CBC WITH DIFFERENTIAL/PLATELET
BASOS ABS: 0.1 10*3/uL (ref 0.0–0.1)
Basophils Relative: 0.9 % (ref 0.0–3.0)
EOS ABS: 0.1 10*3/uL (ref 0.0–0.7)
Eosinophils Relative: 1.6 % (ref 0.0–5.0)
HEMATOCRIT: 40.9 % (ref 36.0–46.0)
HEMOGLOBIN: 13.7 g/dL (ref 12.0–15.0)
LYMPHS PCT: 20 % (ref 12.0–46.0)
Lymphs Abs: 1.4 10*3/uL (ref 0.7–4.0)
MCHC: 33.5 g/dL (ref 30.0–36.0)
MCV: 92.4 fl (ref 78.0–100.0)
MONO ABS: 0.8 10*3/uL (ref 0.1–1.0)
Monocytes Relative: 11 % (ref 3.0–12.0)
Neutro Abs: 4.6 10*3/uL (ref 1.4–7.7)
Neutrophils Relative %: 66.5 % (ref 43.0–77.0)
Platelets: 218 10*3/uL (ref 150.0–400.0)
RBC: 4.42 Mil/uL (ref 3.87–5.11)
RDW: 16.3 % — AB (ref 11.5–15.5)
WBC: 6.9 10*3/uL (ref 4.0–10.5)

## 2018-05-06 LAB — URINALYSIS
BILIRUBIN URINE: NEGATIVE
Hgb urine dipstick: NEGATIVE
Ketones, ur: NEGATIVE
Leukocytes, UA: NEGATIVE
NITRITE: NEGATIVE
Specific Gravity, Urine: 1.01 (ref 1.000–1.030)
TOTAL PROTEIN, URINE-UPE24: NEGATIVE
UROBILINOGEN UA: 0.2 (ref 0.0–1.0)
Urine Glucose: NEGATIVE
pH: 7 (ref 5.0–8.0)

## 2018-05-06 LAB — BASIC METABOLIC PANEL
BUN: 27 mg/dL — ABNORMAL HIGH (ref 6–23)
CHLORIDE: 98 meq/L (ref 96–112)
CO2: 37 meq/L — AB (ref 19–32)
CREATININE: 1.35 mg/dL — AB (ref 0.40–1.20)
Calcium: 10.2 mg/dL (ref 8.4–10.5)
GFR: 39.44 mL/min — ABNORMAL LOW (ref >60.00)
Glucose, Bld: 115 mg/dL — ABNORMAL HIGH (ref 70–99)
POTASSIUM: 3.8 meq/L (ref 3.5–5.1)
SODIUM: 142 meq/L (ref 135–145)

## 2018-05-06 LAB — HEMOGLOBIN A1C: HEMOGLOBIN A1C: 5.8 % (ref 4.6–6.5)

## 2018-05-06 LAB — T4, FREE: FREE T4: 0.7 ng/dL (ref 0.60–1.60)

## 2018-05-06 LAB — TSH: TSH: 1.82 u[IU]/mL (ref 0.35–4.50)

## 2018-05-06 MED ORDER — TRIAMCINOLONE ACETONIDE 0.1 % EX OINT: 1.0000 "application " | TOPICAL_OINTMENT | Freq: Two times a day (BID) | CUTANEOUS | 2 refills | Status: DC

## 2018-05-06 NOTE — Assessment & Plan Note (Signed)
BMET 

## 2018-05-06 NOTE — Assessment & Plan Note (Signed)
Eliquis,Torsemide

## 2018-05-06 NOTE — Progress Notes (Signed)
Subjective:  Patient ID: Mia Contreras, female    DOB: 28-Dec-1928  Age: 84 y.o. MRN: [id number]  CC: No chief complaint on file.   HPI Mia Contreras presents for anxiety, CHF, A fib. C/o a breast lump - bx is a day after tomorrow C/o vag itching  Outpatient Medications Prior to Visit  Medication Sig Dispense Refill  . Acetaminophen (TYLENOL ARTHRITIS PAIN PO) Take 1 tablet by mouth daily as needed (pain).    Marland Kitchen ALPRAZolam (XANAX) 0.5 MG tablet Take 1 tablet (0.5 mg total) by mouth 2 (two) times daily as needed for anxiety. 60 tablet 3  . apixaban (ELIQUIS) 2.5 MG TABS tablet Take 1 tablet (2.5 mg total) by mouth 2 (two) times daily. 60 tablet 11  . carvedilol (COREG) 3.125 MG tablet Take 1 tablet (3.125 mg total) by mouth 2 (two) times daily with a meal. 60 tablet 11  . coal tar (NEUTROGENA T-GEL) 0.5 % shampoo Apply topically 3 (three) times a week.    Marland Kitchen ketoconazole (NIZORAL) 2 % shampoo Apply 1 application topically 2 (two) times a week.    . torsemide (DEMADEX) 20 MG tablet TAKE TWO TABLETS BY MOUTH DAILY 180 tablet 5  . vitamin C (ASCORBIC ACID) 500 MG tablet Take 1 tablet (500 mg total) by mouth daily. 100 tablet 3  . torsemide (DEMADEX) 20 MG tablet TAKE TWO TABLETS BY MOUTH DAILY 180 tablet 5   No facility-administered medications prior to visit.     ROS: Review of Systems  Constitutional: Negative for activity change, appetite change, chills, fatigue and unexpected weight change.  HENT: Negative for congestion, mouth sores and sinus pressure.   Eyes: Negative for visual disturbance.  Respiratory: Negative for cough and chest tightness.   Gastrointestinal: Negative for abdominal pain and nausea.  Genitourinary: Negative for difficulty urinating, frequency and vaginal pain.  Musculoskeletal: Negative for back pain and gait problem.  Skin: Negative for pallor and rash.  Neurological: Negative for dizziness, tremors, weakness, numbness and headaches.    Psychiatric/Behavioral: Negative for confusion and sleep disturbance.    Objective:  BP 114/66 (BP Location: Right Arm, Patient Position: Sitting, Cuff Size: Normal)   Pulse 67   Temp 98.3 F (36.8 C) (Oral)   Ht 5\' 3"  (1.6 m)   Wt 115 lb (52.2 kg)   SpO2 94%   BMI 20.37 kg/m   BP Readings from Last 3 Encounters:  05/06/18 114/66  04/24/18 118/68  03/05/18 132/64    Wt Readings from Last 3 Encounters:  05/06/18 115 lb (52.2 kg)  04/24/18 116 lb 8 oz (52.8 kg)  03/05/18 124 lb (56.2 kg)    Physical Exam  Constitutional: She appears well-developed. No distress.  HENT:  Head: Normocephalic.  Right Ear: External ear normal.  Left Ear: External ear normal.  Nose: Nose normal.  Mouth/Throat: Oropharynx is clear and moist.  Eyes: Pupils are equal, round, and reactive to light. Conjunctivae are normal. Right eye exhibits no discharge. Left eye exhibits no discharge.  Neck: Normal range of motion. Neck supple. No JVD present. No tracheal deviation present. No thyromegaly present.  Cardiovascular: Normal rate, regular rhythm and normal heart sounds.  Pulmonary/Chest: No stridor. No respiratory distress. She has no wheezes.  Abdominal: Soft. Bowel sounds are normal. She exhibits no distension and no mass. There is no tenderness. There is no rebound and no guarding.  Musculoskeletal: She exhibits tenderness. She exhibits no edema.  Lymphadenopathy:    She has no cervical adenopathy.  Neurological: She displays normal reflexes. No cranial nerve deficit. She exhibits normal muscle tone. Coordination normal.  Skin: No rash noted. No erythema.  Psychiatric: She has a normal mood and affect. Her behavior is normal. Judgment and thought content normal.    Lab Results  Component Value Date   WBC 8.6 08/24/2017   HGB 13.4 08/24/2017   HCT 40.2 08/24/2017   PLT 285.0 08/24/2017   GLUCOSE 53 (L) 02/11/2018   CHOL 130 11/04/2015   TRIG 77.0 11/04/2015   HDL 54.50 11/04/2015    LDLCALC 60 11/04/2015   ALT 14 10/15/2017   AST 26 10/15/2017   NA 144 02/11/2018   K 3.8 02/11/2018   CL 98 02/11/2018   CREATININE 1.45 (H) 02/11/2018   BUN 26 02/11/2018   CO2 29 02/11/2018   TSH 2.12 03/05/2018   INR 0.95 10/25/2011   HGBA1C 5.8 02/06/2014   MICROALBUR 0.6 03/23/2011    No results found.  Assessment & Plan:   There are no diagnoses linked to this encounter.   No orders of the defined types were placed in this encounter.    Follow-up: No follow-ups on file.  Walker Kehr, MD

## 2018-05-06 NOTE — Assessment & Plan Note (Signed)
No recent falls [facility]

## 2018-05-06 NOTE — Assessment & Plan Note (Signed)
Triamc 0.1 % oint prn x 3-4 d

## 2018-05-06 NOTE — Assessment & Plan Note (Signed)
Eliquis 

## 2018-05-06 NOTE — Addendum Note (Signed)
Addended by: Karren Cobble on: 05/06/2018 02:26 PM   Modules accepted: Orders

## 2018-05-08 ENCOUNTER — Other Ambulatory Visit: Payer: Self-pay | Admitting: Radiology

## 2018-05-08 ENCOUNTER — Encounter: Payer: Self-pay | Admitting: Internal Medicine

## 2018-05-08 DIAGNOSIS — C50212 Malignant neoplasm of upper-inner quadrant of left female breast: Secondary | ICD-10-CM | POA: Diagnosis not present

## 2018-05-09 ENCOUNTER — Telehealth: Payer: Self-pay | Admitting: Internal Medicine

## 2018-05-09 NOTE — Telephone Encounter (Signed)
FYI

## 2018-05-09 NOTE — Telephone Encounter (Signed)
Copied from [place] [phone]. Topic: Quick Communication - See Telephone Encounter >> May 09, 2018 11:48 AM Sheran Luz wrote: CRM for notification. See Telephone encounter for: 05/09/18.  Pt called stating that she was contacted by [facility] and told she has breast cancer. Pt said she has an appointment at the cancer center on 9/25.  Pt wanted to let Dr. Alain Marion know.

## 2018-05-10 ENCOUNTER — Encounter: Payer: Self-pay | Admitting: *Deleted

## 2018-05-10 ENCOUNTER — Telehealth: Payer: Self-pay | Admitting: Hematology

## 2018-05-10 DIAGNOSIS — Z170 Estrogen receptor positive status [ER+]: Principal | ICD-10-CM

## 2018-05-10 DIAGNOSIS — C50212 Malignant neoplasm of upper-inner quadrant of left female breast: Secondary | ICD-10-CM

## 2018-05-10 NOTE — Telephone Encounter (Signed)
Spoke with patient to confirm afternoon [facility] appointment, [facility] patient, appointment reminder sent

## 2018-05-14 NOTE — Progress Notes (Signed)
[place]   [phone] Fax:([phone]) [place] Note   Patient Care Team: Plotnikov, Evie Lacks, MD as PCP - General Josue Hector, MD as PCP - Cardiology (Cardiology) Josue Hector, MD as Attending Physician (Cardiology) Renato Shin, MD as Consulting Physician (Endocrinology) Rolm Bookbinder, MD as Consulting Physician (Dermatology) Truitt Merle, MD as Consulting Physician (Hematology) Fanny Skates, MD as Consulting Physician (General Surgery) Gery Pray, MD as Consulting Physician (Radiation Oncology) 05/15/2018  Referral from: [place]:  Newly diagnosed left breast cancer  Oncology History   Cancer Staging Malignant neoplasm of upper-inner quadrant of left breast in female, estrogen receptor positive ([place]) Staging form: Breast, AJCC 8th Edition - Clinical stage from 05/08/2018: Stage IA (cT1c, cN0, cM0, G1, ER+, PR+, HER2-) - Signed by Truitt Merle, MD on 05/15/2018       Malignant neoplasm of upper-inner quadrant of left breast in female, estrogen receptor positive ([place])   04/30/2018 Mammogram    diagnostic mammogram on 04/30/2018 at [facility] that revealed 1.2 CM left breast mass.    04/30/2018 Breast US    Breast US revealed a 1.4 CM round solid mass that is suspicious of malignancy.    05/08/2018 Cancer Staging    Staging form: Breast, AJCC 8th Edition - Clinical stage from 05/08/2018: Stage IA (cT1c, cN0, cM0, G1, ER+, PR+, HER2-) - Signed by Truitt Merle, MD on 05/15/2018    05/08/2018 Receptors her2    ADDITIONAL INFORMATION: PROGNOSTIC INDICATORS Results: IMMUNOHISTOCHEMICAL AND MORPHOMETRIC ANALYSIS PERFORMED MANUALLY The tumor cells are Negative for Her2 (1+). Estrogen Receptor: 95%, POSITIVE, STRONG STAINING INTENSITY Progesterone Receptor: 90%, POSITIVE, STRONG STAINING INTENSITY Proliferation Marker Ki67: 10%    05/08/2018 Pathology Results    Diagnosis Breast, left, needle core  biopsy, mass at 10 o'clock, 5 cm fn - INVASIVE DUCTAL CARCINOMA. - DUCTAL CARCINOMA IN SITU. - SEE COMMENT.    05/10/2018 Initial Diagnosis    Malignant neoplasm of upper-inner quadrant of left breast in female, estrogen receptor positive ([place])     HISTORY OF PRESENTING ILLNESS:  Mia Contreras 84 y.o. female is here because of newly diagnosed left breast cancer. Mia Contreras initially presented with a palpable lump that was felt July 2019. Mia Contreras has personal history of left breast cancer that was treated with surgery, chemo, and radiation in 2000. Mia Contreras has a diagnostic mammogram on 04/30/2018 at [facility] that revealed 1.2 CM left breast mass. Breast US revealed a 1.4 CM round solid mass that is suspicious of malignancy. Pathology from the left breast mass confirmed invasive and in situ ductal carcinoma.  Today, Mia Contreras is here alone at the clinic. Mia Contreras stated that the previous cancer was stage 2 because of the size. Mia Contreras saw Dr. [name] for Mia Contreras previous cancer. Mia Contreras said that radiation affected Mia Contreras heart. Mia Contreras states that Mia Contreras bruises easily.   Mia Contreras follows up with cardiology and endocrinology for Mia Contreras chronic conditions including CHF, A-fib, and hyperthyroidism.   Mia Contreras also has OA, scoliosis, and psoriasis. Mia Contreras has gait difficulties due to scoliosis and Mia Contreras states that Mia Contreras had multiple PT sessions. Mia Contreras can walk independently, but uses walker sometimes.   Mia Contreras bother is in [place], [place]. Brother also has CAD and afib, Mother had CABG but did well, Father passed away from MI at age 35 thought to be due to heavy ETOH use. Brother had bladder and prostate cancer and brother's son died from non-hugkin lymphoma.  Pt has a distant history of smoking.  Pt has 50-60 pack years up to 2ppd. Quit in 1982.   Mia Contreras lives alone in a town house, but has friends from church. Mia Contreras is capable of carrying out daily activities independently.   GYN HISTORY  GP: No children Menarche: 79-15 yo LMP: Unknown Hormonal Therapy: Yes, for 6 months.  Stopped in 1972 after complete hysterectomy     MEDICAL HISTORY:  Past Medical History:  Diagnosis Date  . AKI (acute kidney injury) ([place]) 01/2016  . ANEMIA-NOS 08/20/2007  . ANXIETY 03/16/2007  . BREAST CANCER, HX OF 03/16/2007  . CARPAL TUNNEL SYNDROME, RIGHT 01/11/2008  . Cataract    floaters  . CELLULITIS, LEG, RIGHT 08/20/2007  . CHF (congestive heart failure) ([place])   . Collagenous colitis   . COPD (chronic obstructive pulmonary disease) ([place])   . DIVERTICULOSIS, COLON 08/20/2007  . GOITER, MULTINODULAR 05/06/2010   Dr [name]  . HYPERLIPIDEMIA 08/20/2007  . HYPERTENSION 03/16/2007  . HYPERTHYROIDISM 05/23/2010  . IBS (irritable bowel syndrome)   . OSTEOARTHRITIS 03/16/2007  . OSTEOPOROSIS 08/20/2007  . Pneumonia 07/2011   took abx for several weeks  . PREMATURE ATRIAL CONTRACTIONS 09/21/2010  . SCOLIOSIS 01/11/2008  . SKIN CANCER, HX OF 11/12/2008    R cheek 2010, L Cheek 2011 Dr. Tonia Brooms  . Thyroid nodule   . VENOUS INSUFFICIENCY 09/05/2007    SURGICAL HISTORY: Past Surgical History:  Procedure Laterality Date  . ABDOMINAL HYSTERECTOMY    . APPENDECTOMY  1972  . BREAST LUMPECTOMY Left 2001  . CARPAL TUNNEL RELEASE    . CATARACT EXTRACTION    . EYE SURGERY     cataract ext  . HERNIA REPAIR    . MASTECTOMY, PARTIAL Left   . SKIN CANCER EXCISION     face-Dr. Bing Plume  . TOTAL KNEE ARTHROPLASTY    . TOTAL KNEE ARTHROPLASTY Right 2010   Dr Para March  . TOTAL KNEE ARTHROPLASTY  10/30/2011   Procedure: TOTAL KNEE ARTHROPLASTY;  Surgeon: Lorn Junes, MD;  Location: [facility];  Service: Orthopedics;  Laterality: Left;     . XRT     chemo, surgery    SOCIAL HISTORY: Social History   Socioeconomic History  . Marital status: Single    Spouse name: Not on file  . Number of children: Not on file  . Years of education: Not on file  . Highest education level: Not on file  Occupational History  . Not on file  Social Needs  . Financial resource strain: Not on file  . Food  insecurity:    Worry: Not on file    Inability: Not on file  . Transportation needs:    Medical: Not on file    Non-medical: Not on file  Tobacco Use  . Smoking status: Former Smoker    Last attempt to quit: 12/20/1980    Years since quitting: 37.4  . Smokeless tobacco: Never Used  Substance and Sexual Activity  . Alcohol use: Yes    Comment: socially  . Drug use: No  . Sexual activity: Not Currently  Lifestyle  . Physical activity:    Days per week: Not on file    Minutes per session: Not on file  . Stress: Not on file  Relationships  . Social connections:    Talks on phone: Not on file    Gets together: Not on file    Attends religious service: Not on file    Active member of club or organization: Not on file    Attends  meetings of clubs or organizations: Not on file    Relationship status: Not on file  . Intimate partner violence:    Fear of current or ex partner: Not on file    Emotionally abused: Not on file    Physically abused: Not on file    Forced sexual activity: Not on file  Other Topics Concern  . Not on file  Social History Narrative  . Not on file    FAMILY HISTORY: Family History  Problem Relation Age of Onset  . Dementia Mother   . Heart disease Mother   . Mental retardation Mother   . Hypertension Mother   . Alzheimer's disease Mother   . Heart attack Father   . Alcohol abuse Father   . Prostate cancer Brother   . Diabetes Other   . Colon cancer Neg Hx   . Anesthesia problems Neg Hx     ALLERGIES:  is allergic to cefuroxime; celecoxib; deltasone [prednisone]; ranitidine; spironolactone; tapazole [methimazole]; and tramadol hcl.  MEDICATIONS:  Current Outpatient Medications  Medication Sig Dispense Refill  . Acetaminophen (TYLENOL ARTHRITIS PAIN PO) Take 1 tablet by mouth daily as needed (pain).    Marland Kitchen ALPRAZolam (XANAX) 0.5 MG tablet Take 1 tablet (0.5 mg total) by mouth 2 (two) times daily as needed for anxiety. 60 tablet 3  . apixaban  (ELIQUIS) 2.5 MG TABS tablet Take 1 tablet (2.5 mg total) by mouth 2 (two) times daily. 60 tablet 11  . carvedilol (COREG) 3.125 MG tablet Take 1 tablet (3.125 mg total) by mouth 2 (two) times daily with a meal. 60 tablet 11  . coal tar (NEUTROGENA T-GEL) 0.5 % shampoo Apply topically 3 (three) times a week.    Marland Kitchen ketoconazole (NIZORAL) 2 % shampoo Apply 1 application topically 2 (two) times a week.    . torsemide (DEMADEX) 20 MG tablet TAKE TWO TABLETS BY MOUTH DAILY 180 tablet 5  . triamcinolone ointment (KENALOG) 0.1 % Apply 1 application topically 2 (two) times daily. Use qd -bid 80 g 2  . vitamin C (ASCORBIC ACID) 500 MG tablet Take 1 tablet (500 mg total) by mouth daily. 100 tablet 3   No current facility-administered medications for this visit.     REVIEW OF SYSTEMS:   Constitutional: Denies fevers, chills or abnormal night sweats Eyes: Denies blurriness of vision, double vision or watery eyes Ears, nose, mouth, throat, and face: Denies mucositis or sore throat Respiratory: Denies cough, dyspnea or wheezes Cardiovascular: Denies palpitation, chest discomfort or lower extremity swelling Gastrointestinal:  Denies nausea, heartburn or change in bowel habits Skin: Denies abnormal skin rashes (+) easy bruising (+) psoriasis Lymphatics: Denies new lymphadenopathy or easy bruising Neurological:Denies numbness, tingling or new weaknesses MSK: (+) OA and scoliosis (+) instable gait due to scoliosis  Behavioral/Psych: Mood is stable, no new changes  Breast: (+) palpable mass on left breast All other systems were reviewed with the patient and are negative.  PHYSICAL EXAMINATION:  ECOG PERFORMANCE STATUS: 1 - Symptomatic but completely ambulatory  Vitals:   05/15/18 1320  BP: (!) 151/90  Pulse: 88  Resp: 17  Temp: (!) 97.5 F (36.4 C)  SpO2: 91%   Filed Weights   05/15/18 1320  Weight: 124 lb 9.6 oz (56.5 kg)    GENERAL:alert, no distress and comfortable SKIN: skin color,  texture, turgor are normal, no rashes or significant lesions (+) bruise on left foot, which Mia Contreras relates to injury (+) thin fragile skin EYES: normal, conjunctiva are pink and  non-injected, sclera clear OROPHARYNX:no exudate, no erythema and lips, buccal mucosa, and tongue normal  NECK: (+) mobile goiter, non-tender, without nodularity LYMPH:  no palpable lymphadenopathy in the cervical, axillary or inguinal LUNGS: clear to auscultation and percussion with normal breathing effort HEART: regular rate & rhythm and no murmurs and no lower extremity edema ABDOMEN:abdomen soft, non-tender and normal bowel sounds Musculoskeletal:no cyanosis of digits and no clubbing (+) waddling gait due to scoliosis  PSYCH: alert & oriented x 3 with fluent speech NEURO: no focal motor/sensory deficits Breast: (+) left breast mass 1.5 cm at 10 o'clock position 5 cm from nipple  LABORATORY DATA:  I have reviewed the data as listed CBC Latest Ref Rng & Units 05/15/2018 05/06/2018 08/24/2017  WBC 3.9 - 10.3 K/uL 5.3 6.9 8.6  Hemoglobin 11.6 - 15.9 g/dL 13.1 13.7 13.4  Hematocrit 34.8 - 46.6 % 40.5 40.9 40.2  Platelets 145 - 400 K/uL 177 218.0 285.0    CMP Latest Ref Rng & Units 05/15/2018 05/06/2018 02/11/2018  Glucose 70 - 99 mg/dL 86 115(H) 53(L)  BUN 8 - 23 mg/dL 30(H) 27(H) 26  Creatinine 0.44 - 1.00 mg/dL 1.37(H) 1.35(H) 1.45(H)  Sodium 135 - 145 mmol/L 144 142 144  Potassium 3.5 - 5.1 mmol/L 4.2 3.8 3.8  Chloride 98 - 111 mmol/L 101 98 98  CO2 22 - 32 mmol/L 33(H) 37(H) 29  Calcium 8.9 - 10.3 mg/dL 9.5 10.2 10.1  Total Protein 6.5 - 8.1 g/dL 6.9 - -  Total Bilirubin 0.3 - 1.2 mg/dL 1.0 - -  Alkaline Phos 38 - 126 U/L 74 - -  AST 15 - 41 U/L 29 - -  ALT 0 - 44 U/L 23 - -    PATHOLOGY:   05/08/2018 Surgical Pathology  ADDITIONAL INFORMATION: PROGNOSTIC INDICATORS Results: IMMUNOHISTOCHEMICAL AND MORPHOMETRIC ANALYSIS PERFORMED MANUALLY The tumor cells are Negative for Her2 (1+). Estrogen Receptor:  95%, POSITIVE, STRONG STAINING INTENSITY Progesterone Receptor: 90%, POSITIVE, STRONG STAINING INTENSITY Proliferation Marker Ki67: 10% REFERENCE RANGE ESTROGEN RECEPTOR NEGATIVE 0% POSITIVE =>1% REFERENCE RANGE PROGESTERONE RECEPTOR NEGATIVE 0% POSITIVE =>1% All controls stained appropriately Diagnosis Breast, left, needle core biopsy, mass at 10 o'clock, 5 cm fn - INVASIVE DUCTAL CARCINOMA. - DUCTAL CARCINOMA IN SITU. - SEE COMMENT.  Microscopic Comment The carcinoma appears grade I. A breast prognostic profile will be performed and the results reported separately. The results were called to [facility] on 05/09/18. (JBK:kh 05-09-18) Specimen Gross and Clinical Information Specimen Comment 84 year old white female w/o remote left breast CA with new palp mass at 10:00 k cm fn Specimen(s) Obtained: Breast, left, needle core biopsy, mass at 10 o'clock, 5 cm fn Gross Received in formalin labeled with the patient's name and "Left", are 2 core(s) of tan-yellow, fibroadipose tissue, ranging from 1.4 x 0.2 x 0.2 cm to 1.7 x 0.2 x 0.2 cm. The specimen is entirely submitted in 1 cassette(s). Time in formalin 11:58 am on 05/08/18. Cold ischemic time less than 1 min. Craig Staggers 05/08/18 ) Stain(s) used in Diagnosis: The following stain(s) were used in diagnosing the case: ER-ACIS, PR-ACIS, Her2 by IHC, KI-67-ACIS. The control(s) stained appropriately.  RADIOGRAPHIC STUDIES: I have personally reviewed the radiological images as listed and agreed with the findings in the report.  04/30/2018 Breast US   04/30/2018 Mammogram   No results found.  ASSESSMENT & PLAN:  Mia Contreras is a 84 y.o. lovely female with a history of anxiety, CHF, A fib, L Breast cancer in 2000  with surgery, hyperthyroidism, CKD, HTN, and HLD  1. Malignant neoplasm of upper-inner quadrant of left breast, invasive ductal carcinoma and DCIS. ER 95%, PR 90%, Ki67 10% - Mia Contreras initially felt a lump on Mia Contreras left breast in  July 2019.  - Mia Contreras has a diagnostic mammogram on 04/30/2018 at [facility] that revealed 1.2 CM left breast mass. -biopsy from the left breast mass confirmed invasive and in situ ductal carcinoma, grade 1 -Due to Mia Contreras previous breast cancer in the left breast, the standard surgery would be mastectomy.  However due to Mia Contreras advanced age, cardiac disease, Mia Contreras strongly prefer lumpectomy.  Mia Contreras will see Dr. Dalbert Batman to discuss further. -I reviewed Mia Contreras imaging and pathology results with Mia Contreras today. I discussed that because of Mia Contreras previous radiation treatment on the same breast, radiation will not likely be offered for Mia Contreras new breast cancer. -Chemotherapy is not recommended due to Mia Contreras advanced age, and likely low risk disease. I discussed with Mia Contreras and Mia Contreras agrees. ---Giving Mia Contreras strongly ER and PR positivity of the tumor cells, I recommend adjuvant endocrine therapy with tamoxifen or AI.  Due to Mia Contreras osteoporosis and severe osteoarthritis, Mia Contreras probably will tolerate tamoxifen better.  The potential side effects, which includes but not limited to, hot flash, skin and vaginal dryness, slightly increased risk of cardiovascular disease and cataract, small risk of thrombosis, were discussed with Mia Contreras in great details. Preventive strategies for thrombosis, such as being physically active, using compression stocks, avoid cigarette smoking, etc., were reviewed with Mia Contreras.  Mia Contreras previously had a hysterectomy, and the risk of endometrial cancer from tamoxifen.  Mia Contreras voiced good understanding, and agrees to proceed. Will start after Mia Contreras surgery -Dr.  Sondra Come will also see Mia Contreras even though radiation is not recommended due to Mia Contreras previous history of left breast radiation. He will discuss further with Mia Contreras.  -I discussed genetic testing with Mia Contreras. Mia Contreras was not interested.   2. CHF, Afib, HTN, HLD -Mia Contreras f/u with Dr. Johnsie Cancel -Persistent atrial fibrillation stable rate control on low dose coreg. Patient has intermittent palpitation only when under a lot of  stress or anxiety.  This relieves with taking Xanax.  I have advised to take extra Coreg if palpitation persist.  If severe symptomatic call EMS.  Continue Eliquis for anticoagulation.  No bleeding issues no eliquis. -Chronic systolic heart failure:  euvolemic functional class one. Continue beta-blocker and torsemide at current dose.  3. Hyperthyroidism -Mia Contreras f/u with Dr. [name]  4. CKD -Chronic kidney disease stable on labs done 02/11/18 Cr 1.45 .  5. Urinary Incontinence  -Mia Contreras has difficult to control Mia Contreras bladder when Mia Contreras bends over.  Mia Contreras also has a history of hernia.  Declined urology referral. Mia Contreras will discuss with PCP.    PLAN -f/u after surgery to discuss starting Tamoxifen  -Mia Contreras will see Dr. Dalbert Batman to discuss surery, likely will proceed with lumpectomy. -Probably no adjuvant radiation due to Mia Contreras previous radiation to the left breast.  No orders of the defined types were placed in this encounter.   All questions were answered. The patient knows to call the clinic with any problems, questions or concerns. I spent 35 minutes counseling the patient face to face. The total time spent in the appointment was 45 minutes and more than 50% was on counseling.     Truitt Merle, MD 05/15/2018 8:46 PM  I, Carmon Sails am acting as scribe for Dr. Truitt Merle.  I have reviewed the above documentation for accuracy and completeness, and I agree with the  above.

## 2018-05-15 ENCOUNTER — Other Ambulatory Visit: Payer: Self-pay | Admitting: General Surgery

## 2018-05-15 ENCOUNTER — Encounter: Payer: Self-pay | Admitting: Hematology

## 2018-05-15 ENCOUNTER — Inpatient Hospital Stay: Payer: Medicare Other

## 2018-05-15 ENCOUNTER — Inpatient Hospital Stay: Payer: Medicare Other | Attending: Hematology | Admitting: Hematology

## 2018-05-15 ENCOUNTER — Ambulatory Visit
Admission: RE | Admit: 2018-05-15 | Discharge: 2018-05-15 | Disposition: A | Discharge status: Home / Self Care | Payer: Medicare Other | Source: Ambulatory Visit | Attending: Radiation Oncology | Admitting: Radiation Oncology

## 2018-05-15 VITALS — BP 151/90 | HR 88 | Temp 97.5°F | Resp 17 | Ht 63.0 in | Wt 124.6 lb

## 2018-05-15 DIAGNOSIS — I428 Other cardiomyopathies: Secondary | ICD-10-CM | POA: Diagnosis not present

## 2018-05-15 DIAGNOSIS — Z170 Estrogen receptor positive status [ER+]: Principal | ICD-10-CM

## 2018-05-15 DIAGNOSIS — Z8052 Family history of malignant neoplasm of bladder: Secondary | ICD-10-CM

## 2018-05-15 DIAGNOSIS — M419 Scoliosis, unspecified: Secondary | ICD-10-CM | POA: Diagnosis not present

## 2018-05-15 DIAGNOSIS — R32 Unspecified urinary incontinence: Secondary | ICD-10-CM

## 2018-05-15 DIAGNOSIS — L409 Psoriasis, unspecified: Secondary | ICD-10-CM | POA: Insufficient documentation

## 2018-05-15 DIAGNOSIS — C50912 Malignant neoplasm of unspecified site of left female breast: Secondary | ICD-10-CM

## 2018-05-15 DIAGNOSIS — E0590 Thyrotoxicosis, unspecified without thyrotoxic crisis or storm: Secondary | ICD-10-CM | POA: Diagnosis not present

## 2018-05-15 DIAGNOSIS — I4891 Unspecified atrial fibrillation: Secondary | ICD-10-CM | POA: Diagnosis not present

## 2018-05-15 DIAGNOSIS — I5022 Chronic systolic (congestive) heart failure: Secondary | ICD-10-CM | POA: Diagnosis not present

## 2018-05-15 DIAGNOSIS — Z8042 Family history of malignant neoplasm of prostate: Secondary | ICD-10-CM

## 2018-05-15 DIAGNOSIS — Z87891 Personal history of nicotine dependence: Secondary | ICD-10-CM | POA: Insufficient documentation

## 2018-05-15 DIAGNOSIS — Z7901 Long term (current) use of anticoagulants: Secondary | ICD-10-CM | POA: Insufficient documentation

## 2018-05-15 DIAGNOSIS — C50212 Malignant neoplasm of upper-inner quadrant of left female breast: Secondary | ICD-10-CM

## 2018-05-15 DIAGNOSIS — Z9889 Other specified postprocedural states: Secondary | ICD-10-CM | POA: Diagnosis not present

## 2018-05-15 DIAGNOSIS — Z853 Personal history of malignant neoplasm of breast: Secondary | ICD-10-CM | POA: Diagnosis not present

## 2018-05-15 DIAGNOSIS — I130 Hypertensive heart and chronic kidney disease with heart failure and stage 1 through stage 4 chronic kidney disease, or unspecified chronic kidney disease: Secondary | ICD-10-CM | POA: Diagnosis not present

## 2018-05-15 DIAGNOSIS — N189 Chronic kidney disease, unspecified: Secondary | ICD-10-CM

## 2018-05-15 DIAGNOSIS — I482 Chronic atrial fibrillation: Secondary | ICD-10-CM | POA: Diagnosis not present

## 2018-05-15 DIAGNOSIS — Z807 Family history of other malignant neoplasms of lymphoid, hematopoietic and related tissues: Secondary | ICD-10-CM

## 2018-05-15 DIAGNOSIS — Z808 Family history of malignant neoplasm of other organs or systems: Secondary | ICD-10-CM | POA: Diagnosis not present

## 2018-05-15 DIAGNOSIS — Z96653 Presence of artificial knee joint, bilateral: Secondary | ICD-10-CM | POA: Diagnosis not present

## 2018-05-15 DIAGNOSIS — Z923 Personal history of irradiation: Secondary | ICD-10-CM | POA: Diagnosis not present

## 2018-05-15 LAB — CBC WITH DIFFERENTIAL (CANCER CENTER ONLY)
Basophils Absolute: 0 10*3/uL (ref 0.0–0.1)
Basophils Relative: 1 %
Eosinophils Absolute: 0.1 10*3/uL (ref 0.0–0.5)
Eosinophils Relative: 2 %
HEMATOCRIT: 40.5 % (ref 34.8–46.6)
HEMOGLOBIN: 13.1 g/dL (ref 11.6–15.9)
LYMPHS PCT: 19 %
Lymphs Abs: 1 10*3/uL (ref 0.9–3.3)
MCH: 31.3 pg (ref 25.1–34.0)
MCHC: 32.3 g/dL (ref 31.5–36.0)
MCV: 96.9 fL (ref 79.5–101.0)
MONO ABS: 0.8 10*3/uL (ref 0.1–0.9)
Monocytes Relative: 16 %
NEUTROS ABS: 3.3 10*3/uL (ref 1.5–6.5)
NEUTROS PCT: 62 %
Platelet Count: 177 10*3/uL (ref 145–400)
RBC: 4.18 MIL/uL (ref 3.70–5.45)
RDW: 16.3 % — AB (ref 11.2–14.5)
WBC Count: 5.3 10*3/uL (ref 3.9–10.3)

## 2018-05-15 LAB — CMP (CANCER CENTER ONLY)
ALBUMIN: 4.4 g/dL (ref 3.5–5.0)
ALK PHOS: 74 U/L (ref 38–126)
ALT: 23 U/L (ref 0–44)
AST: 29 U/L (ref 15–41)
Anion gap: 10 (ref 5–15)
BUN: 30 mg/dL — ABNORMAL HIGH (ref 8–23)
CALCIUM: 9.5 mg/dL (ref 8.9–10.3)
CO2: 33 mmol/L — ABNORMAL HIGH (ref 22–32)
CREATININE: 1.37 mg/dL — AB (ref 0.44–1.00)
Chloride: 101 mmol/L (ref 98–111)
GFR, EST NON AFRICAN AMERICAN: 34 mL/min — AB (ref >60)
GFR, Est AFR Am: 39 mL/min — ABNORMAL LOW (ref >60)
GLUCOSE: 86 mg/dL (ref 70–99)
Potassium: 4.2 mmol/L (ref 3.5–5.1)
Sodium: 144 mmol/L (ref 135–145)
Total Bilirubin: 1 mg/dL (ref 0.3–1.2)
Total Protein: 6.9 g/dL (ref 6.5–8.1)

## 2018-05-15 NOTE — Progress Notes (Signed)
Radiation Oncology         [phone] ________________________________  Multidisciplinary Breast Oncology Clinic [facility]) Initial Outpatient Consultation  Name: Mia Contreras MRN: [id number]  Date: 05/15/2018  DOB: Jul 27, 1929  LD:[id number], Evie Lacks, MD  Fanny Skates, MD   REFERRING PHYSICIAN: Fanny Skates, MD  DIAGNOSIS: The encounter diagnosis was Malignant neoplasm of upper-inner quadrant of left breast in female, estrogen receptor positive ([place]). Stage IA, cT1c, cN0, cM0, Left Breast, UIQ, Invasive Ductal Carcinoma with DCIS, ER (+), PR (+), HER2 (neg), grade I  Cancer Staging Malignant neoplasm of upper-inner quadrant of left breast in female, estrogen receptor positive ([place]) Staging form: Breast, AJCC 8th Edition - Clinical stage from 05/08/2018: Stage IA (cT1c, cN0, cM0, G1, ER+, PR+, HER2-) - Signed by Truitt Merle, MD on 05/15/2018    HISTORY OF PRESENT ILLNESS::Mia Contreras is a 84 y.o. female who is presenting to the office today for evaluation of her newly diagnosed breast cancer. She is doing well overall. She was previously diagnosed left with breast cancer in February 2000 and was treated by Dr. Annamaria Boots.  She underwent bilateral diagnostic mammography with tomography and left breast ultrasonography at [facility] on 04/30/2018 showing: The 1.2 cm round mass in the left breast is indeterminate.  Accordingly on 05/08/2018 she proceeded to biopsy of the left breast area in question. The pathology from this procedure showed: Breast, left, needle core biopsy, mass at 10 o'clock, 5 cm fn with invasive ductal carcinoma. Ductal carcinoma in situ. Prognostic indicators significant for: estrogen receptor, 95% positive and progesterone receptor, 90% positive, both with strong staining intensity. Proliferation marker Ki67 at 10%. HER2 negative.  She reports associated palpable breast lump. She denies nipple discharge, infection, breast dimpling and any other symptoms. She has a family  hx of prostate and bladder cancers in her brother.    Menarche: 10 years old Age at first live birth: n/a GP: 0 LMP: 70 (s/p complete hysterectomy) Contraceptive: n/a HRT: yes, for 6 months postsurgery   The patient was referred today for presentation in the multidisciplinary conference.  Radiology studies and pathology slides were presented there for review and discussion of treatment options.  A consensus was discussed regarding potential next steps.  PREVIOUS RADIATION THERAPY: Yes, Left Breast, 2000. The patient reports 34 radiation treatments  PAST MEDICAL HISTORY:  has a past medical history of AKI (acute kidney injury) ([place]) (01/2016), ANEMIA-NOS (08/20/2007), ANXIETY (03/16/2007), BREAST CANCER, HX OF (03/16/2007), CARPAL TUNNEL SYNDROME, RIGHT (01/11/2008), Cataract, CELLULITIS, LEG, RIGHT (08/20/2007), CHF (congestive heart failure) ([place]), Collagenous colitis, COPD (chronic obstructive pulmonary disease) ([place]), DIVERTICULOSIS, COLON (08/20/2007), GOITER, MULTINODULAR (05/06/2010), HYPERLIPIDEMIA (08/20/2007), HYPERTENSION (03/16/2007), HYPERTHYROIDISM (05/23/2010), IBS (irritable bowel syndrome), OSTEOARTHRITIS (03/16/2007), OSTEOPOROSIS (08/20/2007), Pneumonia (07/2011), PREMATURE ATRIAL CONTRACTIONS (09/21/2010), SCOLIOSIS (01/11/2008), SKIN CANCER, HX OF (11/12/2008), Thyroid nodule, and VENOUS INSUFFICIENCY (09/05/2007).    PAST SURGICAL HISTORY: Past Surgical History:  Procedure Laterality Date  . ABDOMINAL HYSTERECTOMY    . APPENDECTOMY  1972  . BREAST LUMPECTOMY Left 2001  . CARPAL TUNNEL RELEASE    . CATARACT EXTRACTION    . EYE SURGERY     cataract ext  . HERNIA REPAIR    . MASTECTOMY, PARTIAL Left   . SKIN CANCER EXCISION     face-Dr. Bing Plume  . TOTAL KNEE ARTHROPLASTY    . TOTAL KNEE ARTHROPLASTY Right 2010   Dr Para March  . TOTAL KNEE ARTHROPLASTY  10/30/2011   Procedure: TOTAL KNEE ARTHROPLASTY;  Surgeon: Lorn Junes, MD;  Location: [place];  Service: Orthopedics;   Laterality: Left;     . XRT     chemo, surgery    FAMILY HISTORY: family history includes Alcohol abuse in her father; Alzheimer's disease in her mother; Dementia in her mother; Diabetes in her other; Heart attack in her father; Heart disease in her mother; Hypertension in her mother; Mental retardation in her mother; Prostate cancer in her brother.  SOCIAL HISTORY:  reports that she quit smoking about 37 years ago. She has never used smokeless tobacco. She reports that she drinks alcohol. She reports that she does not use drugs.  ALLERGIES: Cefuroxime; Celecoxib; Deltasone [prednisone]; Ranitidine; Spironolactone; Tapazole [methimazole]; and Tramadol hcl  MEDICATIONS:  Current Outpatient Medications  Medication Sig Dispense Refill  . Acetaminophen (TYLENOL ARTHRITIS PAIN PO) Take 1 tablet by mouth daily as needed (pain).    Marland Kitchen ALPRAZolam (XANAX) 0.5 MG tablet Take 1 tablet (0.5 mg total) by mouth 2 (two) times daily as needed for anxiety. 60 tablet 3  . apixaban (ELIQUIS) 2.5 MG TABS tablet Take 1 tablet (2.5 mg total) by mouth 2 (two) times daily. 60 tablet 11  . carvedilol (COREG) 3.125 MG tablet Take 1 tablet (3.125 mg total) by mouth 2 (two) times daily with a meal. 60 tablet 11  . coal tar (NEUTROGENA T-GEL) 0.5 % shampoo Apply topically 3 (three) times a week.    Marland Kitchen ketoconazole (NIZORAL) 2 % shampoo Apply 1 application topically 2 (two) times a week.    . torsemide (DEMADEX) 20 MG tablet TAKE TWO TABLETS BY MOUTH DAILY 180 tablet 5  . triamcinolone ointment (KENALOG) 0.1 % Apply 1 application topically 2 (two) times daily. Use qd -bid 80 g 2  . vitamin C (ASCORBIC ACID) 500 MG tablet Take 1 tablet (500 mg total) by mouth daily. 100 tablet 3   No current facility-administered medications for this encounter.     REVIEW OF SYSTEMS:  A 10+ POINT REVIEW OF SYSTEMS WAS OBTAINED including neurology, dermatology, psychiatry, cardiac, respiratory, lymph, extremities, GI, GU, musculoskeletal,  constitutional, reproductive, HEENT. She reports occasional anxiety, wears glasses, irregular heartbeat, shortness of breath with walking and climbing stairs, stage 4 kidney disease, psoriasis, joint pain, arthritis, scoliosis, and a thyroid problem. All pertinent positives are noted in the HPI. All others are negative.   PHYSICAL EXAM: Vitals with BMI 05/15/2018  Height '5\' 3"'$   Weight 124 lbs 10 oz  BMI 94.07  Systolic 680  Diastolic 90  Pulse 88  Respirations 17  General: Alert and oriented, in no acute distress Neck: Neck is supple, no palpable cervical or supraclavicular lymphadenopathy. Heart: Regular in rate and rhythm with no murmurs, rubs, or gallops. Chest: Clear to auscultation bilaterally, with no rhonchi, wheezes, or rales. Abdomen: Soft, nontender, nondistended, with no rigidity or guarding. Extremities: No cyanosis or edema. Some bruising noted in the lower extremities Lymphatics: see Neck Exam Skin: No concerning lesions. Musculoskeletal: symmetric strength and muscle tone throughout. Significant scoliosis Neurologic: Cranial nerves II through XII are grossly intact. No obvious focalities. Speech is fluent. Coordination is intact. Psychiatric: Judgment and insight are intact. Affect is appropriate. Breast: right breast with no palpable mass, nipple discharge, or bleeding. left breast with palpable approximately 2 x 2.5 cm mass in the upper inner aspect. Patient has a lumpectomy scar in the upper outer quadrant from her prior surgery and radiation treatments.    KPS = 90  100 - Normal; no complaints; no evidence of disease. 90   - Able to carry on normal  activity; minor signs or symptoms of disease. 80   - Normal activity with effort; some signs or symptoms of disease. 84   - Cares for self; unable to carry on normal activity or to do active work. 60   - Requires occasional assistance, but is able to care for most of his personal needs. 50   - Requires considerable  assistance and frequent medical care. 55   - Disabled; requires special care and assistance. 43   - Severely disabled; hospital admission is indicated although death not imminent. 56   - Very sick; hospital admission necessary; active supportive treatment necessary. 10   - Moribund; fatal processes progressing rapidly. 0     - Dead  Karnofsky DA, Abelmann Combs, Craver LS and Burchenal [facility] [phone]) The use of the nitrogen mustards in the palliative treatment of carcinoma: with particular reference to bronchogenic carcinoma Cancer 1 [phone]  LABORATORY DATA:  Lab Results  Component Value Date   WBC 5.3 05/15/2018   HGB 13.1 05/15/2018   HCT 40.5 05/15/2018   MCV 96.9 05/15/2018   PLT 177 05/15/2018   Lab Results  Component Value Date   NA 144 05/15/2018   K 4.2 05/15/2018   CL 101 05/15/2018   CO2 33 (H) 05/15/2018   Lab Results  Component Value Date   ALT 23 05/15/2018   AST 29 05/15/2018   ALKPHOS 74 05/15/2018   BILITOT 1.0 05/15/2018    RADIOGRAPHY: No results found.    IMPRESSION: Stage IA Left Breast UIQ IDC with DCIS, ER+/PR+, Grade 1  Patient will not be a good candidate for breast conservation with radiotherapy to left Breast given her prior history of left breast radiation treatment. The patient does wish to proceed with breast conserving surgery which would seem reasonable given her advanced age and the fact that her tumor is hormone receptor positive and she is willing to take adjuvant hormonal therapy. She understands that the standard of care in this situation would be to proceed with mastectomy but lumpectomy and adjuvant hormonal therapy would be a reasonable option for her given the age of 84.     PLAN:  1. Left Lumpectomy 2. Aromatase Inhibitor 3. Genetics  ------------------------------------------------  Blair Promise, PhD, MD   This document serves as a record of services personally performed by Gery Pray, MD. It was created on his behalf by Wilburn Mylar, a trained medical scribe. The creation of this record is based on the scribe's personal observations and the provider's statements to them. This document has been checked and approved by the attending provider.

## 2018-05-15 NOTE — Progress Notes (Signed)
[facility] Psychosocial Distress Screening Spiritual Care  Met with Mia Contreras in [facility] to introduce [facility] team/resources, reviewing distress screen per protocol.  The patient scored a 2 on the Psychosocial Distress Thermometer which indicates mild distress. Also assessed for distress and other psychosocial needs.   ONCBCN DISTRESS SCREENING 05/15/2018  Screening Type Initial Screening  Distress experienced in past week (1-10) 2  Referral to support programs Yes  Other heart problems   The patient expressed that she feels supported by her treatment team. The patient also shared that her nephew, who is a Clinical biochemist, and her church circle have been supportive to her since her diagnosis. The patient expressed that she is not currently interested in Patient and Family Support resources, however, she is aware of them if a need arises.  Follow up needed: No.  Doris Cheadle, Counseling Intern [phone]

## 2018-05-22 ENCOUNTER — Telehealth: Payer: Self-pay | Admitting: *Deleted

## 2018-05-22 DIAGNOSIS — H04123 Dry eye syndrome of bilateral lacrimal glands: Secondary | ICD-10-CM | POA: Diagnosis not present

## 2018-05-22 DIAGNOSIS — H00025 Hordeolum internum left lower eyelid: Secondary | ICD-10-CM | POA: Diagnosis not present

## 2018-05-22 DIAGNOSIS — Z961 Presence of intraocular lens: Secondary | ICD-10-CM | POA: Diagnosis not present

## 2018-05-22 NOTE — Telephone Encounter (Signed)
Spoke to pt concerning Guy from 9.25.19. Denies questions or concerns regarding dx or treatment care plan. Encourage pt to call with needs. Received verbal understanding. Discussed with pt that she will take an anti-hormone pill after surgery and that radiation is not planned at this time.

## 2018-05-24 ENCOUNTER — Telehealth: Payer: Self-pay | Admitting: Cardiovascular Disease

## 2018-05-24 NOTE — Telephone Encounter (Signed)
New message:         [facility] Pre-operative Risk Assessment    Request for surgical clearance:  1. What type of surgery is being performed? Left breast lumpectomy   2. When is this surgery scheduled? TBD  3. What type of clearance is required (medical clearance vs. Pharmacy clearance to hold med vs. Both)? Both  4. Are there any medications that need to be held prior to surgery and how long?apixaban (ELIQUIS) 2.5 MG TABS tablet needs to be held 3 days prior to procedure  5. Practice name and name of physician performing surgery? Dr[facility]   6. What is your office phone number [phone]   7.   What is your office fax number [phone]  8.   Anesthesia type (None, local, MAC, general) ? General   Mia Contreras 05/24/2018, 11:59 AM  _________________________________________________________________   (provider comments below)

## 2018-05-24 NOTE — Telephone Encounter (Signed)
Appt scheduled for 05/27/18 at 10 AM with Cecilie Kicks, NP.

## 2018-05-24 NOTE — Telephone Encounter (Signed)
I called patient, she is concerned because of swelling in her Rt ankle and wanted to know if she soul be seen. I will arrange an OV with an APP at [facility], clearance can be given then.  Kerin Ransom PA-C 05/24/2018 1:21 PM

## 2018-05-24 NOTE — Telephone Encounter (Signed)
Message sent to scheduling as URGENT to arrange ov.

## 2018-05-26 NOTE — Progress Notes (Signed)
Cardiology Office Note   Date:  05/27/2018   ID:  Mia Contreras, DOB [birth year], MRN [id number]  PCP:  Cassandria Anger, MD  Cardiologist:  Dr. Frances Nickels HF Dr. Haroldine Laws.    Chief Complaint  Patient presents with  . Leg Swelling      History of Present Illness: Mia Contreras is a 84 y.o. female who presents for pre-op eval.   hx of nonischemic cardiomyopathy, systolic CHF EF 09-47% in 2016 (echo 45-50% in 11/2015), PACs, L Breast cancer in 2000 with surgery, hyperthyroidism and chemo, CKD (followed by Dr. Justin Mend), Persistent atrial fibrillation, HTN and HLD presents for LE edema evaluation.    FH: Brother also has CAD and afib, Mother had CABG but did well, Father passed away from MI at age 58 thought to be 2/2 to heavy ETOH use.  SH: Pt has a distant history of smoking. Pt has 50-60 pack years up to 2ppd. Quit in 1982.   Admitted 4/25 -12/19/15 with A/C combined CHF and AKI. Discharge weight 134 lbs.   Admitted 6/7 ->02/07/16 with volume overload. Diuretic changed to deedex Discharge weight 125 lbs.   Was admitted (10/17) with severe symptomatic hyperkalemia (K >7.5) and ARF (Creatinine 5.3). Was initially bradycardic with HRs in 20s. Spiro and potassium stopped. Treated with fluids and bicarb and recovered. On d/c 10/22 K 4.4 Cr 2.2 Discussed Palliative Care B-blocker stopped.   Seen by Dr. Johnsie Cancel 01/21/18. Noted poorly control HR. Added lopressor 25mg  BID with improved rate.  However patient called on 02/08/18 about SOB, left ankle swelling, and unable to control bladder since starting Metoprolol. Changed to coreg 3.113mb BID. Advised to take extra torsemide. Scr was stable.   On last visit by Dr. Johnsie Cancel  04/24/18 Edema had resolved.  Weight stable at 120 pounds at home.  She has bladder control issue when bend over.  Heart rate is stable on beta-blocker.  She has intermittent palpitations during anxiety attack.  Improves with PRN Xanax.  Easy bruising on  anticoagulation.  Denies dizziness, chest pain, shortness of breath, orthopnea, PND, syncope, melena or blood in her stool or urine.  Recently noted blood sugar of 53 on lab work.  His primary care has advised her to increase carbohydrate intake.  I have at length discussion regarding her diet.  Encourage snack between meals.  She will try protein shake.  + new left breast lump  With history of left lumpectomy breast cancer with XRT and chemo has had yearly mammograms - now for planned lumpectomy after seed implant.  She plans to only have lumpectomy.   Now seen for lower ext edema and eval for surgery.  She has no chest pain and continues with her chronic SOB.  No changes in how she feels.  She has chronic a fib without rapid HR.  No lightheadedness or dizziness.       Her Rt lower ext she hit by accident with shower stool.  It has taken some time to heal.  Continues to ooze blood at times.  Her Rt foot has been swelling since that time.  - she does indulge in salty foods at times.  We discussed holing back on salt and using support stockings.    Past Medical History:  Diagnosis Date  . AKI (acute kidney injury) ([place]) 01/2016  . ANEMIA-NOS 08/20/2007  . ANXIETY 03/16/2007  . BREAST CANCER, HX OF 03/16/2007  . CARPAL TUNNEL SYNDROME, RIGHT 01/11/2008  . Cataract    floaters  .  CELLULITIS, LEG, RIGHT 08/20/2007  . CHF (congestive heart failure) ([place])   . Collagenous colitis   . COPD (chronic obstructive pulmonary disease) ([place])   . DIVERTICULOSIS, COLON 08/20/2007  . GOITER, MULTINODULAR 05/06/2010   Dr [name]  . HYPERLIPIDEMIA 08/20/2007  . HYPERTENSION 03/16/2007  . HYPERTHYROIDISM 05/23/2010  . IBS (irritable bowel syndrome)   . OSTEOARTHRITIS 03/16/2007  . OSTEOPOROSIS 08/20/2007  . Pneumonia 07/2011   took abx for several weeks  . PREMATURE ATRIAL CONTRACTIONS 09/21/2010  . SCOLIOSIS 01/11/2008  . SKIN CANCER, HX OF 11/12/2008    R cheek 2010, L Cheek 2011 Dr. Tonia Brooms  . Thyroid nodule    . VENOUS INSUFFICIENCY 09/05/2007    Past Surgical History:  Procedure Laterality Date  . ABDOMINAL HYSTERECTOMY    . APPENDECTOMY  1972  . BREAST LUMPECTOMY Left 2001  . CARPAL TUNNEL RELEASE    . CATARACT EXTRACTION    . EYE SURGERY     cataract ext  . HERNIA REPAIR    . MASTECTOMY, PARTIAL Left   . SKIN CANCER EXCISION     face-Dr. Bing Plume  . TOTAL KNEE ARTHROPLASTY    . TOTAL KNEE ARTHROPLASTY Right 2010   Dr Para March  . TOTAL KNEE ARTHROPLASTY  10/30/2011   Procedure: TOTAL KNEE ARTHROPLASTY;  Surgeon: Lorn Junes, MD;  Location: [facility];  Service: Orthopedics;  Laterality: Left;     . XRT     chemo, surgery     Current Outpatient Medications  Medication Sig Dispense Refill  . Acetaminophen (TYLENOL ARTHRITIS PAIN PO) Take 1 tablet by mouth daily as needed (pain).    Marland Kitchen ALPRAZolam (XANAX) 0.5 MG tablet Take 1 tablet (0.5 mg total) by mouth 2 (two) times daily as needed for anxiety. 60 tablet 3  . apixaban (ELIQUIS) 2.5 MG TABS tablet Take 1 tablet (2.5 mg total) by mouth 2 (two) times daily. 60 tablet 11  . carvedilol (COREG) 3.125 MG tablet Take 1 tablet (3.125 mg total) by mouth 2 (two) times daily with a meal. 60 tablet 11  . coal tar (NEUTROGENA T-GEL) 0.5 % shampoo Apply topically 3 (three) times a week.    Marland Kitchen ketoconazole (NIZORAL) 2 % shampoo Apply 1 application topically 2 (two) times a week.    . torsemide (DEMADEX) 20 MG tablet TAKE TWO TABLETS BY MOUTH DAILY 180 tablet 5  . triamcinolone ointment (KENALOG) 0.1 % Apply 1 application topically 2 (two) times daily. Use qd -bid 80 g 2  . vitamin C (ASCORBIC ACID) 500 MG tablet Take 1 tablet (500 mg total) by mouth daily. 100 tablet 3   No current facility-administered medications for this visit.     Allergies:   Cefuroxime; Celecoxib; Deltasone [prednisone]; Ranitidine; Spironolactone; Tapazole [methimazole]; and Tramadol hcl    Social History:  The patient  reports that she quit smoking about 37 years ago. She  has never used smokeless tobacco. She reports that she drinks alcohol. She reports that she does not use drugs.   Family History:  The patient's family history includes Alcohol abuse in her father; Alzheimer's disease in her mother; Dementia in her mother; Diabetes in her other; Heart attack in her father; Heart disease in her mother; Hypertension in her mother; Mental retardation in her mother; Prostate cancer in her brother.    ROS:  General:no colds or fevers, no weight changes Skin:no rashes or ulcers HEENT:no blurred vision, no congestion CV:see HPI PUL:see HPI GI:no diarrhea constipation or melena, no indigestion GU:no hematuria, no  dysuria MS:no joint pain, no claudication, +  Neuro:no syncope, no lightheadedness Endo:no diabetes, no thyroid disease  Wt Readings from Last 3 Encounters:  05/27/18 125 lb 12.8 oz (57.1 kg)  05/15/18 124 lb 9.6 oz (56.5 kg)  05/06/18 115 lb (52.2 kg)     PHYSICAL EXAM: VS:  BP (!) 120/58   Pulse 88   Ht 5\' 3"  (1.6 m)   Wt 125 lb 12.8 oz (57.1 kg)   SpO2 90%   BMI 22.28 kg/m  , BMI Body mass index is 22.28 kg/m. General:Pleasant affect, NAD Skin:Warm and dry, brisk capillary refill HEENT:normocephalic, sclera clear, mucus membranes moist Neck:supple, no JVD, no bruits  Heart:irreg irreg without murmur, gallup, rub or click Lungs:clear without rales, rhonchi, or wheezes GDJ:MEQA, non tender, + BS, do not palpate liver spleen or masses Ext Rt foot with lower ext edema,  Rt calf with laceration on Rt calf with no infection 2+ pedal pulses, 2+ radial pulses Neuro:alert and oriented X 3, MAE, follows commands, + facial symmetry    EKG:  EKG is NOT ordered today.  Recent Labs: 05/06/2018: TSH 1.82 05/15/2018: ALT 23; BUN 30; Creatinine 1.37; Hemoglobin 13.1; Platelet Count 177; Potassium 4.2; Sodium 144    Lipid Panel    Component Value Date/Time   CHOL 130 11/04/2015 1016   TRIG 77.0 11/04/2015 1016   TRIG 72 08/01/2006 0841   HDL  54.50 11/04/2015 1016   CHOLHDL 2 11/04/2015 1016   VLDL 15.4 11/04/2015 1016   LDLCALC 60 11/04/2015 1016       Other studies Reviewed: Additional studies/ records that were reviewed today include: . Echo 12/15/15  Study Conclusions  - Left ventricle: The cavity size was normal. Wall thickness was   normal. Systolic function was mildly reduced. The estimated   ejection fraction was in the range of 45% to 50%. Diffuse   hypokinesis. - Ventricular septum: The contour showed diastolic flattening and   systolic flattening. These changes are consistent with RV volume   and pressure overload. - Mitral valve: Calcified annulus. Mildly thickened leaflets .   There was mild regurgitation. - Left atrium: The atrium was moderately dilated. - Right ventricle: The cavity size was severely dilated. Systolic   function was moderately to severely reduced. - Right atrium: The atrium was severely dilated. - Tricuspid valve: There was malcoaptation of the valve leaflets.   There was severe regurgitation directed centrally. - Pulmonary arteries: PA peak pressure: 77 mm Hg (S).  Impressions:  - Compared to last year&'s study, there is worsening pulmonary   hypertension and drastic worsening of tricuspid insufficiency and   right ventricular dysfuntion.  Transthoracic echocardiography.  M-mode, complete 2D, spectral Doppler, and color Doppler.  Birthdate:  Patient birthdate: August 24, 1928.  Age:  Patient is 84 yr old.  Sex:  Gender: female. BMI: 26.8 kg/m^2.  Blood pressure:     81/50  Patient status: Inpatient.  Study date:  Study date: 12/15/2015. Study time: 05:38 PM.  Location:  Bedside.  ASSESSMENT AND PLAN:  1. Pre-op eval  For lumpectomy of Lt breast. She is at moderate risk for age, but no angina.  Chronic a fib.   2.  Rt foot edema below laceration to Rt calf due to laceration , will take extra torsemide for one day, decrease salt and wear support stockings.    3.  Permament a  fib. rate controlled  Ok to hold Eliquis for 3 days prior to procedure  4.  Chronic syst9olic HF  stable euvolemic  5.  CKD 3 stable.   Follow up with Dr. Johnsie Cancel in 1 year.      Current medicines are reviewed with the patient today.  The patient Has no concerns regarding medicines.  The following changes have been made:  See above Labs/ tests ordered today include:see above  Disposition:   FU:  see above  Signed, Cecilie Kicks, NP  05/27/2018 10:24 AM    [facility], [place], [address], [place] Phone: [phone]; Fax: [phone]

## 2018-05-27 ENCOUNTER — Ambulatory Visit (INDEPENDENT_AMBULATORY_CARE_PROVIDER_SITE_OTHER): Payer: Medicare Other | Admitting: Cardiology

## 2018-05-27 ENCOUNTER — Encounter: Payer: Self-pay | Admitting: Cardiology

## 2018-05-27 VITALS — BP 120/58 | HR 88 | Ht 63.0 in | Wt 125.8 lb

## 2018-05-27 DIAGNOSIS — N183 Chronic kidney disease, stage 3 unspecified: Secondary | ICD-10-CM

## 2018-05-27 DIAGNOSIS — I5022 Chronic systolic (congestive) heart failure: Secondary | ICD-10-CM

## 2018-05-27 DIAGNOSIS — R600 Localized edema: Secondary | ICD-10-CM

## 2018-05-27 DIAGNOSIS — Z01810 Encounter for preprocedural cardiovascular examination: Secondary | ICD-10-CM | POA: Diagnosis not present

## 2018-05-27 DIAGNOSIS — I482 Chronic atrial fibrillation, unspecified: Secondary | ICD-10-CM | POA: Diagnosis not present

## 2018-05-27 NOTE — Patient Instructions (Signed)
Medication Instructions:  Tomorrow--take ONE EXTRA TORSEMIDE for swelling. Otherwise, no changes to medications.  Continue current medicines. If you need a refill on your cardiac medications before your next appointment, please call your pharmacy.   Lab work: none If you have labs (blood work) drawn today and your tests are completely normal, you will receive your results only by: Marland Kitchen MyChart Message (if you have MyChart) OR . A paper copy in the mail If you have any lab test that is abnormal or we need to change your treatment, we will call you to review the results.  Testing/Procedures: none  Follow-Up: At [facility], you and your health needs are our priority.  As part of our continuing mission to provide you with exceptional heart care, we have created designated Provider Care Teams.  These Care Teams include your primary Cardiologist (physician) and Advanced Practice Providers (APPs -  Physician Assistants and Nurse Practitioners) who all work together to provide you with the care you need, when you need it. You will need a follow up appointment in 12 months.  Please call our office 2 months in advance to schedule this appointment.  You may see Jenkins Rouge, MD or one of the following Advanced Practice Providers on your designated Care Team:   Truitt Merle, NP Cecilie Kicks, NP . Kathyrn Drown, NP  Any Other Special Instructions Will Be Listed Below (If Applicable). Compression stocking to help with swelling. Put on first thing in AM, remove at bedtime.   Low-Sodium Eating Plan Sodium, which is an element that makes up salt, helps you maintain a healthy balance of fluids in your body. Too much sodium can increase your blood pressure and cause fluid and waste to be held in your body. Your health care provider or dietitian may recommend following this plan if you have high blood pressure (hypertension), kidney disease, liver disease, or heart failure. Eating less sodium can help  lower your blood pressure, reduce swelling, and protect your heart, liver, and kidneys. What are tips for following this plan? General guidelines  Most people on this plan should limit their sodium intake to 1,500-2,000 mg (milligrams) of sodium each day. Reading food labels  The Nutrition Facts label lists the amount of sodium in one serving of the food. If you eat more than one serving, you must multiply the listed amount of sodium by the number of servings.  Choose foods with less than 140 mg of sodium per serving.  Avoid foods with 300 mg of sodium or more per serving. Shopping  Look for lower-sodium products, often labeled as "low-sodium" or "no salt added."  Always check the sodium content even if foods are labeled as "unsalted" or "no salt added".  Buy fresh foods. ? Avoid canned foods and premade or frozen meals. ? Avoid canned, cured, or processed meats  Buy breads that have less than 80 mg of sodium per slice. Cooking  Eat more home-cooked food and less restaurant, buffet, and fast food.  Avoid adding salt when cooking. Use salt-free seasonings or herbs instead of table salt or sea salt. Check with your health care provider or pharmacist before using salt substitutes.  Cook with plant-based oils, such as canola, sunflower, or olive oil. Meal planning  When eating at a restaurant, ask that your food be prepared with less salt or no salt, if possible.  Avoid foods that contain MSG (monosodium glutamate). MSG is sometimes added to [place] food, bouillon, and some canned foods. What foods are recommended? The  items listed may not be a complete list. Talk with your dietitian about what dietary choices are best for you. Grains Low-sodium cereals, including oats, puffed wheat and rice, and shredded wheat. Low-sodium crackers. Unsalted rice. Unsalted pasta. Low-sodium bread. Whole-grain breads and whole-grain pasta. Vegetables Fresh or frozen vegetables. "No salt added"  canned vegetables. "No salt added" tomato sauce and paste. Low-sodium or reduced-sodium tomato and vegetable juice. Fruits Fresh, frozen, or canned fruit. Fruit juice. Meats and other protein foods Fresh or frozen (no salt added) meat, poultry, seafood, and fish. Low-sodium canned tuna and salmon. Unsalted nuts. Dried peas, beans, and lentils without added salt. Unsalted canned beans. Eggs. Unsalted nut butters. Dairy Milk. Soy milk. Cheese that is naturally low in sodium, such as ricotta cheese, fresh mozzarella, or Swiss cheese Low-sodium or reduced-sodium cheese. Cream cheese. Yogurt. Fats and oils Unsalted butter. Unsalted margarine with no trans fat. Vegetable oils such as canola or olive oils. Seasonings and other foods Fresh and dried herbs and spices. Salt-free seasonings. Low-sodium mustard and ketchup. Sodium-free salad dressing. Sodium-free light mayonnaise. Fresh or refrigerated horseradish. Lemon juice. Vinegar. Homemade, reduced-sodium, or low-sodium soups. Unsalted popcorn and pretzels. Low-salt or salt-free chips. What foods are not recommended? The items listed may not be a complete list. Talk with your dietitian about what dietary choices are best for you. Grains Instant hot cereals. Bread stuffing, pancake, and biscuit mixes. Croutons. Seasoned rice or pasta mixes. Noodle soup cups. Boxed or frozen macaroni and cheese. Regular salted crackers. Self-rising flour. Vegetables Sauerkraut, pickled vegetables, and relishes. Olives. [place] fries. Onion rings. Regular canned vegetables (not low-sodium or reduced-sodium). Regular canned tomato sauce and paste (not low-sodium or reduced-sodium). Regular tomato and vegetable juice (not low-sodium or reduced-sodium). Frozen vegetables in sauces. Meats and other protein foods Meat or fish that is salted, canned, smoked, spiced, or pickled. Bacon, ham, sausage, hotdogs, corned beef, chipped beef, packaged lunch meats, salt pork, jerky, pickled  herring, anchovies, regular canned tuna, sardines, salted nuts. Dairy Processed cheese and cheese spreads. Cheese curds. Blue cheese. Feta cheese. String cheese. Regular cottage cheese. Buttermilk. Canned milk. Fats and oils Salted butter. Regular margarine. Ghee. Bacon fat. Seasonings and other foods Onion salt, garlic salt, seasoned salt, table salt, and sea salt. Canned and packaged gravies. Worcestershire sauce. Tartar sauce. Barbecue sauce. Teriyaki sauce. Soy sauce, including reduced-sodium. Steak sauce. Fish sauce. Oyster sauce. Cocktail sauce. Horseradish that you find on the shelf. Regular ketchup and mustard. Meat flavorings and tenderizers. Bouillon cubes. Hot sauce and Tabasco sauce. Premade or packaged marinades. Premade or packaged taco seasonings. Relishes. Regular salad dressings. Salsa. Potato and tortilla chips. Corn chips and puffs. Salted popcorn and pretzels. Canned or dried soups. Pizza. Frozen entrees and pot pies. Summary  Eating less sodium can help lower your blood pressure, reduce swelling, and protect your heart, liver, and kidneys.  Most people on this plan should limit their sodium intake to 1,500-2,000 mg (milligrams) of sodium each day.  Canned, boxed, and frozen foods are high in sodium. Restaurant foods, fast foods, and pizza are also very high in sodium. You also get sodium by adding salt to food.  Try to cook at home, eat more fresh fruits and vegetables, and eat less fast food, canned, processed, or prepared foods. This information is not intended to replace advice given to you by your health care provider. Make sure you discuss any questions you have with your health care provider. Document Released: 01/27/2002 Document Revised: 07/31/2016 Document Reviewed: 07/31/2016 Elsevier Interactive  Patient Education  [employer].

## 2018-05-27 NOTE — Telephone Encounter (Signed)
Pt takes Eliquis for afib with CHADS2VASc score of 5 (age x2, sex, CHF, HTN). SCr 1.37, CrCl 26.21mL/min using actual body weight. Ok to hold Eliquis for 3 days prior to procedure as requested.

## 2018-05-29 ENCOUNTER — Encounter: Payer: Self-pay | Admitting: *Deleted

## 2018-05-29 ENCOUNTER — Telehealth: Payer: Self-pay | Admitting: Hematology

## 2018-05-29 DIAGNOSIS — L218 Other seborrheic dermatitis: Secondary | ICD-10-CM | POA: Diagnosis not present

## 2018-05-29 DIAGNOSIS — L820 Inflamed seborrheic keratosis: Secondary | ICD-10-CM | POA: Diagnosis not present

## 2018-05-29 DIAGNOSIS — Z85828 Personal history of other malignant neoplasm of skin: Secondary | ICD-10-CM | POA: Diagnosis not present

## 2018-05-29 NOTE — Telephone Encounter (Signed)
Called regarding 11/11

## 2018-05-31 ENCOUNTER — Telehealth: Payer: Self-pay | Admitting: Genetics

## 2018-05-31 NOTE — Telephone Encounter (Signed)
Could not leave message.  Was calling to answer patient's questions about genetic counseling appointment.  Will try again next week.  05/31/2018 2:27PM.

## 2018-06-03 NOTE — Telephone Encounter (Signed)
Told patient what appointment was about for genetic testing and answered her questions- she reports she will keep this appointment.

## 2018-06-04 NOTE — Pre-Procedure Instructions (Signed)
Mia Contreras Basic  06/04/2018      [facility] [address] [place] [address] [place] [address] Phone: [phone] Fax: [phone]    Your procedure is scheduled on Tuesday October 22.  Report to [facility] Admitting at 7:30 A.M.  Call this number if you have problems the morning of surgery:  [phone]   Remember:  Do not eat or drink after midnight.  You may drink clear liquids until 6:30 .  Clear liquids allowed are: Water, Juice (non-citric and without pulp), Clear Tea, Black Coffee only and Gatorade    Take these medicines the morning of surgery with A SIP OF WATER:   Carvedilol (Coreg) Acetaminophen (tylenol) if needed Alprazolam (Xanax) if needed  7 days prior to surgery STOP taking any Aspirin(unless otherwise instructed by your surgeon), Aleve, Naproxen, Ibuprofen, Motrin, Advil, Goody's, BC's, all herbal medications, fish oil, and all vitamins  FOLLOW your surgeon's instructions on stopping Eliquis (Apixaban). If no instructions were given, please call your surgeon's office.      Do not wear jewelry, make-up or nail polish.  Do not wear lotions, powders, or perfumes, or deodorant.  Do not shave 48 hours prior to surgery.  Men may shave face and neck.  Do not bring valuables to the hospital.  [facility] is not responsible for any belongings or valuables.  Contacts, dentures or bridgework may not be worn into surgery.  Leave your suitcase in the car.  After surgery it may be brought to your room.  For patients admitted to the hospital, discharge time will be determined by your treatment team.  Patients discharged the day of surgery will not be allowed to drive home.   Special instructions:    [facility]- Preparing For Surgery  Before surgery, you can play an important role. Because skin is not sterile, your skin needs to be as free of germs as possible. You can reduce the number of germs  on your skin by washing with CHG (chlorahexidine gluconate) Soap before surgery.  CHG is an antiseptic cleaner which kills germs and bonds with the skin to continue killing germs even after washing.    Oral Hygiene is also important to reduce your risk of infection.  Remember - BRUSH YOUR TEETH THE MORNING OF SURGERY WITH YOUR REGULAR TOOTHPASTE  Please do not use if you have an allergy to CHG or antibacterial soaps. If your skin becomes reddened/irritated stop using the CHG.  Do not shave (including legs and underarms) for at least 48 hours prior to first CHG shower. It is OK to shave your face.  Please follow these instructions carefully.   1. Shower the NIGHT BEFORE SURGERY and the MORNING OF SURGERY with CHG.   2. If you chose to wash your hair, wash your hair first as usual with your normal shampoo.  3. After you shampoo, rinse your hair and body thoroughly to remove the shampoo.  4. Use CHG as you would any other liquid soap. You can apply CHG directly to the skin and wash gently with a scrungie or a clean washcloth.   5. Apply the CHG Soap to your body ONLY FROM THE NECK DOWN.  Do not use on open wounds or open sores. Avoid contact with your eyes, ears, mouth and genitals (private parts). Wash Face and genitals (private parts)  with your normal soap.  6. Wash thoroughly, paying special attention to the area where  your surgery will be performed.  7. Thoroughly rinse your body with warm water from the neck down.  8. DO NOT shower/wash with your normal soap after using and rinsing off the CHG Soap.  9. Pat yourself dry with a CLEAN TOWEL.  10. Wear CLEAN PAJAMAS to bed the night before surgery, wear comfortable clothes the morning of surgery  11. Place CLEAN SHEETS on your bed the night of your first shower and DO NOT SLEEP WITH PETS.    Day of Surgery:  Do not apply any deodorants/lotions.  Please wear clean clothes to the hospital/surgery center.   Remember to brush your  teeth WITH YOUR REGULAR TOOTHPASTE.    Please read over the following fact sheets that you were given. Coughing and Deep Breathing and Surgical Site Infection Prevention

## 2018-06-05 ENCOUNTER — Other Ambulatory Visit: Payer: Self-pay

## 2018-06-05 ENCOUNTER — Encounter (HOSPITAL_COMMUNITY)
Admission: RE | Admit: 2018-06-05 | Discharge: 2018-06-05 | Disposition: A | Discharge status: Home / Self Care | Payer: Medicare Other | Source: Ambulatory Visit | Attending: General Surgery | Admitting: General Surgery

## 2018-06-05 ENCOUNTER — Encounter (HOSPITAL_COMMUNITY): Payer: Self-pay

## 2018-06-05 DIAGNOSIS — Z01812 Encounter for preprocedural laboratory examination: Secondary | ICD-10-CM | POA: Insufficient documentation

## 2018-06-05 HISTORY — DX: Cardiac arrhythmia, unspecified: I49.9

## 2018-06-05 HISTORY — DX: Dyspnea, unspecified: R06.00

## 2018-06-05 LAB — CBC WITH DIFFERENTIAL/PLATELET
Abs Immature Granulocytes: 0.01 10*3/uL (ref 0.00–0.07)
BASOS ABS: 0.1 10*3/uL (ref 0.0–0.1)
Basophils Relative: 1 %
EOS PCT: 2 %
Eosinophils Absolute: 0.1 10*3/uL (ref 0.0–0.5)
HEMATOCRIT: 43.5 % (ref 36.0–46.0)
HEMOGLOBIN: 13.5 g/dL (ref 12.0–15.0)
Immature Granulocytes: 0 %
LYMPHS PCT: 18 %
Lymphs Abs: 1.1 10*3/uL (ref 0.7–4.0)
MCH: 30.9 pg (ref 26.0–34.0)
MCHC: 31 g/dL (ref 30.0–36.0)
MCV: 99.5 fL (ref 80.0–100.0)
Monocytes Absolute: 0.9 10*3/uL (ref 0.1–1.0)
Monocytes Relative: 14 %
NEUTROS ABS: 4.2 10*3/uL (ref 1.7–7.7)
NRBC: 0 % (ref 0.0–0.2)
Neutrophils Relative %: 65 %
Platelets: 205 10*3/uL (ref 150–400)
RBC: 4.37 MIL/uL (ref 3.87–5.11)
RDW: 16 % — ABNORMAL HIGH (ref 11.5–15.5)
WBC: 6.4 10*3/uL (ref 4.0–10.5)

## 2018-06-05 LAB — COMPREHENSIVE METABOLIC PANEL
ALBUMIN: 4.1 g/dL (ref 3.5–5.0)
ALK PHOS: 70 U/L (ref 38–126)
ALT: 21 U/L (ref 0–44)
ANION GAP: 9 (ref 5–15)
AST: 29 U/L (ref 15–41)
BILIRUBIN TOTAL: 1 mg/dL (ref 0.3–1.2)
BUN: 20 mg/dL (ref 8–23)
CALCIUM: 9.5 mg/dL (ref 8.9–10.3)
CO2: 33 mmol/L — ABNORMAL HIGH (ref 22–32)
Chloride: 100 mmol/L (ref 98–111)
Creatinine, Ser: 1.26 mg/dL — ABNORMAL HIGH (ref 0.44–1.00)
GFR calc Af Amer: 43 mL/min — ABNORMAL LOW (ref >60)
GFR calc non Af Amer: 37 mL/min — ABNORMAL LOW (ref >60)
GLUCOSE: 79 mg/dL (ref 70–99)
Potassium: 3.6 mmol/L (ref 3.5–5.1)
Sodium: 142 mmol/L (ref 135–145)
TOTAL PROTEIN: 6.6 g/dL (ref 6.5–8.1)

## 2018-06-05 NOTE — Progress Notes (Addendum)
PCP:  Dr. Alain Marion Cardiologist: Frances Nickels Nephrologist: Dr. Justin Mend  Pt. Reports Last dose eliquis is 10/18  Per Dr. Dalbert Batman

## 2018-06-06 NOTE — Progress Notes (Signed)
Anesthesia Chart Review:  Case:  [id number] Date/Time:  06/11/18 0915   Procedure:  LEFT BREAST LUMPECTOMY WITH BRACKETED RADIOACTIVE SEED LOCALIZATION (Left Breast)   Anesthesia type:  General   Pre-op diagnosis:  RECURRENT LEFT BREAST CANCER   Location:  [facility] OR ROOM 10 / [facility] OR   Surgeon:  Fanny Skates, MD      DISCUSSION: 84 yo female former smoker. Pertinent hx includes Anxiety, HTN, Goiter, Hyperthyroid, Anemia, COPD, CHF, Pulm HTN, Nonischemic cardiomyopathy (EF 45-50% by echo 2017), CKD, Persistent afib, L Breast cancer in 2000 with surgery and chemo.  Pt seen by Cecilie Kicks, NP 05/27/2018 for preop eval. Per her note "Pre-op eval  For lumpectomy of Lt breast. She is at moderate risk for age, but no angina. Chronic systolic HF stable euvolemic. Permament a fib. rate controlled  Ok to hold Eliquis for 3 days prior to procedure."  Anticipate she can proceed as planned barring acute status change.  VS: BP 123/74   Pulse 82   Temp (!) 36.4 C   Resp 18   Ht 5\' 3"  (1.6 m)   Wt 55.3 kg   SpO2 94%   BMI 21.61 kg/m   PROVIDERS: Plotnikov, Evie Lacks, MD is PCP  Jenkins Rouge, MD is Cardiologist  Glori Bickers, DM is HF Cardiologist  Edrick Oh, MD is Nephrologist  LABS: Labs reviewed: Acceptable for surgery. (all labs ordered are listed, but only abnormal results are displayed)  Labs Reviewed  CBC WITH DIFFERENTIAL/PLATELET - Abnormal; Notable for the following components:      Result Value   RDW 16.0 (*)    All other components within normal limits  COMPREHENSIVE METABOLIC PANEL - Abnormal; Notable for the following components:   CO2 33 (*)    Creatinine, Ser 1.26 (*)    GFR calc non Af Amer 37 (*)    GFR calc Af Amer 43 (*)    All other components within normal limits     IMAGES: N/A   EKG: 02/11/2018: Atrial fibrillation. Ventricular rate 73. Left axis deviation.  Anteroseptal infarct, age undetermined.  ST and T wave abnormality, consider lateral ischemia.     CV: TTE 12/15/2015 (study was done in setting of acute decompensated HF and new onset afib): Study Conclusions  - Left ventricle: The cavity size was normal. Wall thickness was   normal. Systolic function was mildly reduced. The estimated   ejection fraction was in the range of 45% to 50%. Diffuse   hypokinesis. - Ventricular septum: The contour showed diastolic flattening and   systolic flattening. These changes are consistent with RV volume   and pressure overload. - Mitral valve: Calcified annulus. Mildly thickened leaflets .   There was mild regurgitation. - Left atrium: The atrium was moderately dilated. - Right ventricle: The cavity size was severely dilated. Systolic   function was moderately to severely reduced. - Right atrium: The atrium was severely dilated. - Tricuspid valve: There was malcoaptation of the valve leaflets.   There was severe regurgitation directed centrally. - Pulmonary arteries: PA peak pressure: 77 mm Hg (S).  Impressions:  - Compared to last year&'s study, there is worsening pulmonary   hypertension and drastic worsening of tricuspid insufficiency and   right ventricular dysfuntion.  Past Medical History:  Diagnosis Date  . AKI (acute kidney injury) ([place]) 01/2016  . ANEMIA-NOS 08/20/2007  . ANXIETY 03/16/2007  . BREAST CANCER, HX OF 03/16/2007  . CARPAL TUNNEL SYNDROME, RIGHT 01/11/2008  . Cataract  floaters  . CELLULITIS, LEG, RIGHT 08/20/2007  . CHF (congestive heart failure) ([place])   . Collagenous colitis   . COPD (chronic obstructive pulmonary disease) ([place])   . DIVERTICULOSIS, COLON 08/20/2007  . Dyspnea    on exertion  . Dysrhythmia    atrial fib  . GOITER, MULTINODULAR 05/06/2010   Dr [name]  . HYPERLIPIDEMIA 08/20/2007  . HYPERTENSION 03/16/2007  . HYPERTHYROIDISM 05/23/2010  . IBS (irritable bowel syndrome)   . OSTEOARTHRITIS 03/16/2007  . OSTEOPOROSIS 08/20/2007  . Pneumonia 07/2011   took abx for several weeks  .  PREMATURE ATRIAL CONTRACTIONS 09/21/2010  . SCOLIOSIS 01/11/2008  . SKIN CANCER, HX OF 11/12/2008    R cheek 2010, L Cheek 2011 Dr. Tonia Brooms  . Thyroid nodule   . VENOUS INSUFFICIENCY 09/05/2007    Past Surgical History:  Procedure Laterality Date  . ABDOMINAL HYSTERECTOMY    . APPENDECTOMY  1972  . BREAST LUMPECTOMY Left 2001  . CARPAL TUNNEL RELEASE    . CATARACT EXTRACTION    . EYE SURGERY     cataract ext  . HERNIA REPAIR    . MASTECTOMY, PARTIAL Left   . SKIN CANCER EXCISION     face-Dr. Bing Plume  . TOTAL KNEE ARTHROPLASTY    . TOTAL KNEE ARTHROPLASTY Right 2010   Dr Para March  . TOTAL KNEE ARTHROPLASTY  10/30/2011   Procedure: TOTAL KNEE ARTHROPLASTY;  Surgeon: Lorn Junes, MD;  Location: [facility];  Service: Orthopedics;  Laterality: Left;     . XRT     chemo, surgery    MEDICATIONS: . acetaminophen (TYLENOL) 650 MG CR tablet  . ALPRAZolam (XANAX) 0.5 MG tablet  . apixaban (ELIQUIS) 2.5 MG TABS tablet  . carvedilol (COREG) 3.125 MG tablet  . coal tar (NEUTROGENA T-GEL) 0.5 % shampoo  . ketoconazole (NIZORAL) 2 % shampoo  . torsemide (DEMADEX) 20 MG tablet  . triamcinolone ointment (KENALOG) 0.1 %  . vitamin C (ASCORBIC ACID) 500 MG tablet   No current facility-administered medications for this encounter.      Wynonia Musty [facility] Short Stay Center/Anesthesiology Phone [phone] 06/06/2018 12:47 PM

## 2018-06-07 ENCOUNTER — Encounter: Payer: Self-pay | Admitting: Family

## 2018-06-07 ENCOUNTER — Ambulatory Visit (INDEPENDENT_AMBULATORY_CARE_PROVIDER_SITE_OTHER): Payer: Medicare Other | Admitting: Family

## 2018-06-07 VITALS — BP 132/84 | HR 92 | Temp 97.6°F | Ht 63.0 in | Wt 125.0 lb

## 2018-06-07 DIAGNOSIS — L03115 Cellulitis of right lower limb: Secondary | ICD-10-CM | POA: Diagnosis not present

## 2018-06-07 MED ORDER — DOXYCYCLINE HYCLATE 100 MG PO TABS: 100.0000 mg | ORAL_TABLET | Freq: Two times a day (BID) | ORAL | 0 refills | Status: DC

## 2018-06-07 NOTE — Patient Instructions (Signed)
Please call the nurse with your surgeon to let them know about the wound on your leg; will treat with 5 days of Doxycycline; the surgeon's office can let you know if any adjustments need to be made.

## 2018-06-07 NOTE — Progress Notes (Signed)
Mia Contreras is a 84 y.o. female with the following history as recorded in EpicCare:  Patient Active Problem List   Diagnosis Date Noted  . Malignant neoplasm of upper-inner quadrant of left breast in female, estrogen receptor positive ([place]) 05/10/2018  . Grief 02/12/2018  . Psoriasis 09/14/2017  . Liver disorder 09/13/2017  . Hematoma of leg 08/24/2017  . Myalgia 06/21/2017  . Rash and nonspecific skin eruption 05/22/2017  . Arthralgia 04/13/2017  . Vaginitis, atrophic 03/06/2017  . Symptomatic bradycardia   . Acute systolic CHF (congestive heart failure) ([place])   . Hyperkalemia 06/06/2016  . CKD (chronic kidney disease) stage 4, GFR 15-29 ml/min (HCC) 06/06/2016  . Skin tear of left forearm without complication 47/42/5956  . HCAP (healthcare-associated pneumonia) 01/30/2016  . Chronic anticoagulation-Eliquis 01/30/2016  . Hypotension-currently asymptomatic 01/30/2016  . Malnutrition of moderate degree 01/27/2016  . Falls 01/26/2016  . Hypoxia 01/26/2016  . Chronic CHF (congestive heart failure) ([place]) 01/26/2016  . Atrial fibrillation ([place]) 01/11/2016  . PAF- currently in AF on Amiodarone 200 mg BID   . Acute on chronic combined systolic and diastolic heart failure ([place]) 12/14/2015  . Acute kidney injury superimposed on chronic kidney disease ([place]) 12/14/2015  . CRF (chronic renal failure) 12/14/2015  . IBS (irritable bowel syndrome) 12/14/2015  . Open leg wound 11/08/2015  . Hiatal hernia 05/12/2015  . Subconjunctival hemorrhage 08/07/2014  . Nausea without vomiting 07/15/2014  . Noninfected skin tear of right leg 07/03/2014  . Hypoglycemia 12/12/2013  . Chronic systolic heart failure ([place]) 08/22/2013  . NICM (nonischemic cardiomyopathy) ([place]) 08/22/2013  . Diarrhea 10/01/2012  . Dyspnea on exertion 06/27/2012  . Asthmatic bronchitis 06/27/2012  . UTI (urinary tract infection) 11/02/2011  . Postoperative anemia due to acute blood loss 11/01/2011  . COPD (chronic  obstructive pulmonary disease) ([place]) 08/16/2011  . Cough 08/08/2011  . Preop exam for internal medicine 08/02/2011  . Cerumen impaction 08/02/2011  . Bruit 04/19/2011  . Arrhythmia 03/29/2011  . Neoplasm of uncertain behavior of skin 09/21/2010  . PREMATURE ATRIAL CONTRACTIONS 09/21/2010  . CYSTITIS 09/21/2010  . Thyrotoxicosis 05/23/2010  . GOITER, MULTINODULAR 05/06/2010  . TOBACCO USE, QUIT 06/14/2009  . ELECTROCARDIOGRAM, ABNORMAL 04/02/2009  . SKIN CANCER, HX OF 11/12/2008  . CARPAL TUNNEL SYNDROME, RIGHT 01/11/2008  . SCOLIOSIS 01/11/2008  . VENOUS INSUFFICIENCY 09/05/2007  . Edema 09/05/2007  . Anemia in chronic kidney disease 08/20/2007  . DIVERTICULOSIS, COLON 08/20/2007  . CELLULITIS, LEG, RIGHT 08/20/2007  . Osteoporosis 08/20/2007  . Open wound of knee, leg, and ankle 07/03/2007  . Anxiety 03/16/2007  . Essential hypertension 03/16/2007  . Osteoarthritis 03/16/2007  . hx: breast cancer, left, infiltrating ductal 03/16/2007    Current Outpatient Medications  Medication Sig Dispense Refill  . acetaminophen (TYLENOL) 650 MG CR tablet Take 650 mg by mouth every 8 (eight) hours as needed for pain.    Marland Kitchen ALPRAZolam (XANAX) 0.5 MG tablet Take 1 tablet (0.5 mg total) by mouth 2 (two) times daily as needed for anxiety. (Patient taking differently: Take 0.5 mg by mouth 2 (two) times daily as needed for anxiety or sleep. ) 60 tablet 3  . apixaban (ELIQUIS) 2.5 MG TABS tablet Take 1 tablet (2.5 mg total) by mouth 2 (two) times daily. 60 tablet 11  . carvedilol (COREG) 3.125 MG tablet Take 1 tablet (3.125 mg total) by mouth 2 (two) times daily with a meal. 60 tablet 11  . coal tar (NEUTROGENA T-GEL) 0.5 % shampoo Apply topically 2 (two)  times a week.     Marland Kitchen ketoconazole (NIZORAL) 2 % shampoo Apply 1 application topically 2 (two) times a week.    . torsemide (DEMADEX) 20 MG tablet TAKE TWO TABLETS BY MOUTH DAILY (Patient taking differently: Take 40 mg by mouth daily. ) 180 tablet 5   . triamcinolone ointment (KENALOG) 0.1 % Apply 1 application topically 2 (two) times daily. Use qd -bid (Patient taking differently: Apply 1 application topically daily as needed (irritation). Use qd -bid) 80 g 2  . vitamin C (ASCORBIC ACID) 500 MG tablet Take 1 tablet (500 mg total) by mouth daily. 100 tablet 3  . doxycycline (VIBRA-TABS) 100 MG tablet Take 1 tablet (100 mg total) by mouth 2 (two) times daily. 10 tablet 0   No current facility-administered medications for this visit.     Allergies: Cefuroxime; Celecoxib; Deltasone [prednisone]; Ranitidine; Spironolactone; Tapazole [methimazole]; and Tramadol hcl  Past Medical History:  Diagnosis Date  . AKI (acute kidney injury) ([place]) 01/2016  . ANEMIA-NOS 08/20/2007  . ANXIETY 03/16/2007  . BREAST CANCER, HX OF 03/16/2007  . CARPAL TUNNEL SYNDROME, RIGHT 01/11/2008  . Cataract    floaters  . CELLULITIS, LEG, RIGHT 08/20/2007  . CHF (congestive heart failure) ([place])   . Collagenous colitis   . COPD (chronic obstructive pulmonary disease) ([place])   . DIVERTICULOSIS, COLON 08/20/2007  . Dyspnea    on exertion  . Dysrhythmia    atrial fib  . GOITER, MULTINODULAR 05/06/2010   Dr [name]  . HYPERLIPIDEMIA 08/20/2007  . HYPERTENSION 03/16/2007  . HYPERTHYROIDISM 05/23/2010  . IBS (irritable bowel syndrome)   . OSTEOARTHRITIS 03/16/2007  . OSTEOPOROSIS 08/20/2007  . Pneumonia 07/2011   took abx for several weeks  . PREMATURE ATRIAL CONTRACTIONS 09/21/2010  . SCOLIOSIS 01/11/2008  . SKIN CANCER, HX OF 11/12/2008    R cheek 2010, L Cheek 2011 Dr. Tonia Brooms  . Thyroid nodule   . VENOUS INSUFFICIENCY 09/05/2007    Past Surgical History:  Procedure Laterality Date  . ABDOMINAL HYSTERECTOMY    . APPENDECTOMY  1972  . BREAST LUMPECTOMY Left 2001  . CARPAL TUNNEL RELEASE    . CATARACT EXTRACTION    . EYE SURGERY     cataract ext  . HERNIA REPAIR    . MASTECTOMY, PARTIAL Left   . SKIN CANCER EXCISION     face-Dr. Bing Plume  . TOTAL KNEE  ARTHROPLASTY    . TOTAL KNEE ARTHROPLASTY Right 2010   Dr Para March  . TOTAL KNEE ARTHROPLASTY  10/30/2011   Procedure: TOTAL KNEE ARTHROPLASTY;  Surgeon: Lorn Junes, MD;  Location: [facility];  Service: Orthopedics;  Laterality: Left;     . XRT     chemo, surgery    Family History  Problem Relation Age of Onset  . Dementia Mother   . Heart disease Mother   . Mental retardation Mother   . Hypertension Mother   . Alzheimer's disease Mother   . Heart attack Father   . Alcohol abuse Father   . Prostate cancer Brother   . Diabetes Other   . Colon cancer Neg Hx   . Anesthesia problems Neg Hx     Social History   Tobacco Use  . Smoking status: Former Smoker    Last attempt to quit: 12/20/1980    Years since quitting: 37.4  . Smokeless tobacco: Never Used  Substance Use Topics  . Alcohol use: Not Currently    Subjective:  Patient had an injury in her shower approximately  6 weeks ago- thinks her shower chair hit her leg and caused bruise; is on Eliquis and a "slow healer." Has now had the area evaluated by her pharmacist and cardiologist- was told that area not infected in early October ; encouraged to try extra dose of Torsemide and thought would heal in 24-48 hours; patient is now concerned that 2 more weeks has gone by and area continues to be red, swollen and draining; self-treating with Polysporin and band-aids; concerned about upcoming surgery for breast cancer on Tuesday;     Objective:  Vitals:   06/07/18 1400  BP: 132/84  Pulse: 92  Temp: 97.6 F (36.4 C)  TempSrc: Oral  SpO2: 93%  Weight: 125 lb (56.7 kg)  Height: 5\' 3"  (1.6 m)    General: Well developed, well nourished, in no acute distress  Skin : Warm and dry. erythema on lower extremity with open wound approximately size of a quarter with pustular drainage noted Head: Normocephalic and atraumatic  Lungs: Respirations unlabored; clear to auscultation bilaterally without wheeze, rales, rhonchi  Extremities: +  pedal edema,  Vessels: Symmetric bilaterally  Neurologic: Alert and oriented; speech intact; face symmetrical; moves all extremities well; CNII-XII intact without focal deficit   Assessment:  1. Cellulitis of right lower extremity     Plan:  Will start Doxycycline 100 mg bid x 5 days; she will continue to use Polysporin and keep the wound covered;  I called the RN at surgeon's office about upcoming surgery with concerns; patient understands that she will call the triage nurse at the surgeon's office on Monday to follow-up about the wound; most likely she will need to see the surgeon before going to [facility] to have radiation seeds placed; Patient understands the treatment plan as directed by the surgeon's office.   Time spent 30+ minutes coordinating care;   No follow-ups on file.  No orders of the defined types were placed in this encounter.   Requested Prescriptions   Signed Prescriptions Disp Refills  . doxycycline (VIBRA-TABS) 100 MG tablet 10 tablet 0    Sig: Take 1 tablet (100 mg total) by mouth 2 (two) times daily.

## 2018-06-09 NOTE — H&P (Signed)
[name] Location: [facility] Patient #: [id number] DOB: 01-01-1929 Undefined / Language: Cleophus Molt / Race: White Female        History of Present Illness      The patient is a 84 year old female who presents with a complaint of recurrent cancer left breast. This is an 84 year old female, referred to the [facility] by Emmit Pomfret at [facility] mammography for evaluation and management of recurrent cancer of the left breast. Her PCP is Dr. Alain Marion. Her cardiologist is Dr. Johnsie Cancel. She is seen in the clinic today by Dr. Burr Medico, Dr. Sondra Come and me     She developed cancer of the left breast, laterally, in 1999. Marylene Buerger performed a lumpectomy, axillary surgery and she underwent adjuvant radiation therapy and adjuvant chemotherapy. She states that she felt a lump in her left breast for about 6 months. Her recent mammograms show a 1.2 cm mass in the left breast, upper inner quadrant, posterior depth, 4.5 cm from the nipple. There was also a 4 mm mass in the left breast at the 10 o'clock position 5 cm from the nipple, very near the primary cancer. This could not be confidently found at the time of the image guided biopsy Also noted were lumpectomy changes and clips in the lateral left breast. Image guided biopsy of the left breast cancer revealed invasive ductal carcinoma, ER 95%, PR 90%, Ki-67 10%, HER-2 negative.      The patient understands that mastectomy is the standard of care here but she declines mastectomy and requests lumpectomy and adjuvant therapy. She knows that she has an undefined increased risk of local recurrence from this but is hopeful that if we follow her closely that it will not affect her survival.      Comorbidities include atrial fibrillation. Anticoagulated on Eliquis.. Systolic congestive heart failure. Nonischemic cardiomyopathy. Hyperthyroidism followed by Dr. [name]. COPD. Former smoker, remote. Bilateral total knee replacements. Hypertension.  Irritable bowel syndrome. Prior left breast cancer.  Family history reveals mother died age 53. Dementia and hypertension. Father died age 79 myocardial infarction and alcohol abuse. Brother had prostate cancer Social History reveal she lives alone. Independent No family but does have friends. Lives in [place]. Former smoker.      PLAN: We agreed to proceed with left breast lumpectomy with double bracketed radioactive seed localization The tumor appears close to the skin so I'll probably take an ellipse of skin Preop cardiac risk assessment and clearance with Dr. Johnsie Cancel Discontinue Eliquis 3 days preoperative if OK with Dr. Diannia Ruder Check thyroid function testing if not done recently...TSH 1.82 9 days ago. This is within normal limits and reasonable for surgery. Adjuvant antiestrogen therapy There is no role for adjuvant radiation therapy as this breast has already been radiated.       I discussed the indications, details, techniques, and numerous risk of left breast lumpectomy with double bracketed radioactive seed localization. She knows I'll take an ellipse of skin. I discussed the indications, details, techniques, and numerous risk of the surgery with her. She is aware of the risk of cosmetic deformity, bleeding, infection, nerve damage and chronic pain, reoperation for positive margins, cardiac pulmonary and thromboembolic problems. She understands all these issues. All of her questions are answered. She agrees with this plan.    Past Surgical History  Breast Biopsy  Left. Knee Surgery  Bilateral. Oral Surgery   Diagnostic Studies History Colonoscopy  1-5 years ago Mammogram  within last year Pap Smear  >5 years  ago  Medication History  Medications Reconciled  Social History  Alcohol use  Remotely quit alcohol use. Caffeine use  Carbonated beverages, Coffee. No drug use  Tobacco use  Former smoker.  Family History  Alcohol Abuse   Father. Heart Disease  Father, Mother. Heart disease in female family member before age 26  Hypertension  Father, Mother.  Pregnancy / Birth History  Age at menarche  64 years. Contraceptive History  Oral contraceptives. Gravida  0 Irregular periods   Other Problems  Anxiety Disorder  Arthritis  Back Pain  Bladder Problems  Breast Cancer  Chronic Renal Failure Syndrome  High blood pressure  Melanoma  Other disease, cancer, significant illness  Thyroid Disease     Review of Systems  General Not Present- Appetite Loss, Chills, Fatigue, Fever, Night Sweats, Weight Gain and Weight Loss. Skin Not Present- Change in Wart/Mole, Dryness, Hives, Jaundice, New Lesions, Non-Healing Wounds, Rash and Ulcer. HEENT Present- Wears glasses/contact lenses. Not Present- Earache, Hearing Loss, Hoarseness, Nose Bleed, Oral Ulcers, Ringing in the Ears, Seasonal Allergies, Sinus Pain, Sore Throat, Visual Disturbances and Yellow Eyes. Respiratory Not Present- Bloody sputum, Chronic Cough, Difficulty Breathing, Snoring and Wheezing. Breast Not Present- Breast Mass, Breast Pain, Nipple Discharge and Skin Changes. Cardiovascular Present- Shortness of Breath. Not Present- Chest Pain, Difficulty Breathing Lying Down, Leg Cramps, Palpitations, Rapid Heart Rate and Swelling of Extremities. Gastrointestinal Not Present- Abdominal Pain, Bloating, Bloody Stool, Change in Bowel Habits, Chronic diarrhea, Constipation, Difficulty Swallowing, Excessive gas, Gets full quickly at meals, Hemorrhoids, Indigestion, Nausea, Rectal Pain and Vomiting. Female Genitourinary Present- Frequency. Not Present- Nocturia, Painful Urination, Pelvic Pain and Urgency. Musculoskeletal Present- Back Pain and Joint Pain. Not Present- Joint Stiffness, Muscle Pain, Muscle Weakness and Swelling of Extremities. Neurological Present- Trouble walking and Weakness. Not Present- Decreased Memory, Fainting, Headaches, Numbness,  Seizures, Tingling and Tremor. Psychiatric Present- Anxiety. Not Present- Bipolar, Change in Sleep Pattern, Depression, Fearful and Frequent crying. Hematology Present- Blood Thinners, Easy Bruising and Excessive bleeding. Not Present- Gland problems, HIV and Persistent Infections.   Physical Exam General Mental Status-Alert. General Appearance-Consistent with stated age. Hydration-Well hydrated. Voice-Normal. Note: Pleasant. Very talkative. Very pleasant and talkative. Ambulating independently. Low BMI.   Head and Neck Head-normocephalic, atraumatic with no lesions or palpable masses. Trachea-midline. Thyroid Gland Characteristics - normal size and consistency. Note: Thyroid upper limits of normal in size.   Eye Eyeball - Bilateral-Extraocular movements intact. Sclera/Conjunctiva - Bilateral-No scleral icterus.  Chest and Lung Exam Chest and lung exam reveals -quiet, even and easy respiratory effort with no use of accessory muscles and on auscultation, normal breath sounds, no adventitious sounds and normal vocal resonance. Inspection Chest Wall - Normal. Back - normal.  Breast Note: Breasts are small and atrophic. Right breast reveals no skin change, mass or axillary adenopathy. Left breast reveals a 1/2 cm palpable mass upper inner quadrant. This feels close to the skin that is not invading the skin. Well-healed curvilinear lumpectomy scar lateral left breast. Well-healed left axillary scar. No adenopathy.   Cardiovascular Cardiovascular examination reveals -normal pedal pulses bilaterally. Note: Controlled rate but irregularly irregular rhythm.  Abdomen Inspection Inspection of the abdomen reveals - No Hernias. Skin - Scar - no surgical scars. Palpation/Percussion Palpation and Percussion of the abdomen reveal - Soft, Non Tender, No Rebound tenderness, No Rigidity (guarding) and No hepatosplenomegaly. Auscultation Auscultation of the abdomen  reveals - Bowel sounds normal.  Neurologic Neurologic evaluation reveals -alert and oriented x 3 with no impairment of recent or  remote memory. Mental Status-Normal.  Musculoskeletal Normal Exam - Left-Upper Extremity Strength Normal and Lower Extremity Strength Normal. Normal Exam - Right-Upper Extremity Strength Normal and Lower Extremity Strength Normal.  Lymphatic Head & Neck  General Head & Neck Lymphatics: Bilateral - Description - Normal. Axillary  General Axillary Region: Bilateral - Description - Normal. Tenderness - Non Tender. Femoral & Inguinal  Generalized Femoral & Inguinal Lymphatics: Bilateral - Description - Normal. Tenderness - Non Tender.    Assessment & Plan  PRIMARY CANCER OF UPPER INNER QUADRANT OF LEFT FEMALE BREAST (C50.212).         You recently felt a lump in your left breast X-ray studies show a 1.2 cm mass in the left breast, upper inner quadrant There is a 4 mm mass nearby which was seen on some x-rays and not on others Stable lumpectomy changes were noticed in the upper outer quadrant of the left breast Image guided biopsy shows an invasive ductal carcinoma. Hormone receptors are positive. HER-2/neu is negative      In 1999 you underwent left breast lumpectomy, lymph node biopsy, postop chemotherapy, postoperative radiation therapy In your situation the standard of care is a left total mastectomy You have declined mastectomy and prefer lumpectomy. This may have a slight increased local recurrence rate but all of the doctors involved  say that this would be reasonable for you.      You have been referred for genetic counseling.      you will be scheduled for left breast lumpectomy with bracketed radioactive seed localization We will put two radioactive seeds in your left breast to get both lesions out. We will probably take some overlying skin because the tumor feels close to the skin.      Postoperatively you will receive antiestrogen  therapy We will follow you very closely for many years.       you will need to stop your Eliquis blood thinner for 3 days preoperative if okay with Dr. Johnsie Cancel We will ask Dr. Johnsie Cancel for a formal cardiac risk assessment, clearance, and permission to stop the Eliquis for 3 days.  HISTORY OF LEFT BREAST CANCER (Z85.3) Impression: 1999. Left lumpectomy, lateral breast, lymph node biopsy, the area. Postop chemotherapy and radiation therapy. HYPERTHYROIDISM (E05.90) Impression: sees Dr. Renato Shin. TSH 1.82 9 days ago. This is within normal limits. ATRIAL FIBRILLATION, CHRONIC (I48.2) ANTICOAGULATED (Z79.01) Impression: Eliquis. sees Dr. Johnsie Cancel NONISCHEMIC CARDIOMYOPATHY (I35.3) SYSTOLIC CHF, CHRONIC (P12.25) CKD (CHRONIC KIDNEY DISEASE) (N18.9) TOTAL KNEE REPLACEMENT STATUS, BILATERAL (Z96.653) FORMER SMOKER (Y34.621) Impression: Remote    Irby Fails M. Dalbert Batman, M.D., [facility] General and Minimally invasive Surgery Breast and Colorectal Surgery Office:   [phone] Pager:   [phone]

## 2018-06-10 ENCOUNTER — Encounter: Payer: Medicare Other | Admitting: Genetics

## 2018-06-10 ENCOUNTER — Other Ambulatory Visit: Payer: Medicare Other

## 2018-06-10 DIAGNOSIS — I482 Chronic atrial fibrillation, unspecified: Secondary | ICD-10-CM | POA: Diagnosis not present

## 2018-06-10 DIAGNOSIS — Z87891 Personal history of nicotine dependence: Secondary | ICD-10-CM | POA: Diagnosis not present

## 2018-06-10 DIAGNOSIS — N189 Chronic kidney disease, unspecified: Secondary | ICD-10-CM | POA: Diagnosis not present

## 2018-06-10 DIAGNOSIS — C50212 Malignant neoplasm of upper-inner quadrant of left female breast: Secondary | ICD-10-CM | POA: Diagnosis not present

## 2018-06-10 DIAGNOSIS — Z7901 Long term (current) use of anticoagulants: Secondary | ICD-10-CM | POA: Diagnosis not present

## 2018-06-10 DIAGNOSIS — I5022 Chronic systolic (congestive) heart failure: Secondary | ICD-10-CM | POA: Diagnosis not present

## 2018-06-10 DIAGNOSIS — I428 Other cardiomyopathies: Secondary | ICD-10-CM | POA: Diagnosis not present

## 2018-06-10 DIAGNOSIS — Z853 Personal history of malignant neoplasm of breast: Secondary | ICD-10-CM | POA: Diagnosis not present

## 2018-06-10 DIAGNOSIS — E0590 Thyrotoxicosis, unspecified without thyrotoxic crisis or storm: Secondary | ICD-10-CM | POA: Diagnosis not present

## 2018-06-10 DIAGNOSIS — Z96653 Presence of artificial knee joint, bilateral: Secondary | ICD-10-CM | POA: Diagnosis not present

## 2018-06-10 NOTE — Anesthesia Preprocedure Evaluation (Addendum)
Anesthesia Evaluation  Patient identified by MRN, date of birth, ID band Patient awake    Reviewed: Allergy & Precautions, H&P , NPO status , Patient's Chart, lab work & pertinent test results  History of Anesthesia Complications (+) history of anesthetic complications  Airway Mallampati: II  TM Distance: >3 FB Neck ROM: Full    Dental no notable dental hx. (+) Dental Advisory Given, Partial Upper   Pulmonary COPD, former smoker,    Pulmonary exam normal breath sounds clear to auscultation       Cardiovascular hypertension, Pt. on medications and Pt. on home beta blockers +CHF  Normal cardiovascular exam+ dysrhythmias  Rhythm:Regular Rate:Normal  ECHO 12/15/15 Left ventricle: The cavity size was normal. Wall thickness was   normal. Systolic function was mildly reduced. The estimated   ejection fraction was in the range of 45% to 50%. Diffuse   hypokinesis. - Ventricular septum: The contour showed diastolic flattening and   systolic flattening. These changes are consistent with RV volume   and pressure overload. - Mitral valve: Calcified annulus. Mildly thickened leaflets .   There was mild regurgitation. - Left atrium: The atrium was moderately dilated. - Right ventricle: The cavity size was severely dilated. Systolic   function was moderately to severely reduced. - Right atrium: The atrium was severely dilated. - Tricuspid valve: There was malcoaptation of the valve leaflets.   There was severe regurgitation directed centrally. - Pulmonary arteries: PA peak pressure: 77 mm Hg (S).    Neuro/Psych negative psych ROS   GI/Hepatic hiatal hernia,   Endo/Other  Hyperthyroidism   Renal/GU Renal disease     Musculoskeletal negative musculoskeletal ROS (+)   Abdominal   Peds  Hematology  (+) Blood dyscrasia, anemia ,   Anesthesia Other Findings   Reproductive/Obstetrics negative OB ROS                          Anesthesia Physical Anesthesia Plan  ASA: IV  Anesthesia Plan: General   Post-op Pain Management:  Regional for Post-op pain   Induction: Intravenous  PONV Risk Score and Plan: Treatment may vary due to age or medical condition and Ondansetron  Airway Management Planned: LMA  Additional Equipment:   Intra-op Plan:   Post-operative Plan:   Informed Consent: I have reviewed the patients History and Physical, chart, labs and discussed the procedure including the risks, benefits and alternatives for the proposed anesthesia with the patient or authorized representative who has indicated his/her understanding and acceptance.   Dental advisory given  Plan Discussed with: CRNA  Anesthesia Plan Comments:       Anesthesia Quick Evaluation

## 2018-06-11 ENCOUNTER — Ambulatory Visit (HOSPITAL_COMMUNITY)
Admission: RE | Admit: 2018-06-11 | Discharge: 2018-06-11 | Disposition: A | Discharge status: Home / Self Care | Payer: Medicare Other | Source: Ambulatory Visit | Attending: General Surgery | Admitting: General Surgery

## 2018-06-11 ENCOUNTER — Encounter (HOSPITAL_COMMUNITY)
Admission: RE | Disposition: A | Discharge status: Home / Self Care | Payer: Self-pay | Source: Ambulatory Visit | Attending: General Surgery

## 2018-06-11 ENCOUNTER — Ambulatory Visit: Payer: Medicare Other | Admitting: Endocrinology

## 2018-06-11 ENCOUNTER — Encounter (HOSPITAL_COMMUNITY): Payer: Self-pay | Admitting: *Deleted

## 2018-06-11 ENCOUNTER — Ambulatory Visit (HOSPITAL_COMMUNITY): Payer: Medicare Other | Admitting: Anesthesiology

## 2018-06-11 ENCOUNTER — Ambulatory Visit (HOSPITAL_COMMUNITY): Payer: Medicare Other | Admitting: Physician Assistant

## 2018-06-11 DIAGNOSIS — I428 Other cardiomyopathies: Secondary | ICD-10-CM | POA: Diagnosis not present

## 2018-06-11 DIAGNOSIS — I110 Hypertensive heart disease with heart failure: Secondary | ICD-10-CM | POA: Diagnosis not present

## 2018-06-11 DIAGNOSIS — Z79899 Other long term (current) drug therapy: Secondary | ICD-10-CM | POA: Diagnosis not present

## 2018-06-11 DIAGNOSIS — Z87891 Personal history of nicotine dependence: Secondary | ICD-10-CM | POA: Insufficient documentation

## 2018-06-11 DIAGNOSIS — C50912 Malignant neoplasm of unspecified site of left female breast: Secondary | ICD-10-CM | POA: Diagnosis not present

## 2018-06-11 DIAGNOSIS — I132 Hypertensive heart and chronic kidney disease with heart failure and with stage 5 chronic kidney disease, or end stage renal disease: Secondary | ICD-10-CM | POA: Insufficient documentation

## 2018-06-11 DIAGNOSIS — N186 End stage renal disease: Secondary | ICD-10-CM | POA: Diagnosis not present

## 2018-06-11 DIAGNOSIS — Z96653 Presence of artificial knee joint, bilateral: Secondary | ICD-10-CM | POA: Diagnosis not present

## 2018-06-11 DIAGNOSIS — K589 Irritable bowel syndrome without diarrhea: Secondary | ICD-10-CM | POA: Insufficient documentation

## 2018-06-11 DIAGNOSIS — I482 Chronic atrial fibrillation, unspecified: Secondary | ICD-10-CM | POA: Insufficient documentation

## 2018-06-11 DIAGNOSIS — F419 Anxiety disorder, unspecified: Secondary | ICD-10-CM | POA: Diagnosis not present

## 2018-06-11 DIAGNOSIS — E0590 Thyrotoxicosis, unspecified without thyrotoxic crisis or storm: Secondary | ICD-10-CM | POA: Diagnosis not present

## 2018-06-11 DIAGNOSIS — Z170 Estrogen receptor positive status [ER+]: Secondary | ICD-10-CM | POA: Insufficient documentation

## 2018-06-11 DIAGNOSIS — J449 Chronic obstructive pulmonary disease, unspecified: Secondary | ICD-10-CM | POA: Insufficient documentation

## 2018-06-11 DIAGNOSIS — I5022 Chronic systolic (congestive) heart failure: Secondary | ICD-10-CM | POA: Insufficient documentation

## 2018-06-11 DIAGNOSIS — Z7901 Long term (current) use of anticoagulants: Secondary | ICD-10-CM | POA: Insufficient documentation

## 2018-06-11 DIAGNOSIS — F0390 Unspecified dementia without behavioral disturbance: Secondary | ICD-10-CM | POA: Insufficient documentation

## 2018-06-11 DIAGNOSIS — C50212 Malignant neoplasm of upper-inner quadrant of left female breast: Secondary | ICD-10-CM | POA: Diagnosis not present

## 2018-06-11 HISTORY — PX: BREAST LUMPECTOMY WITH RADIOACTIVE SEED LOCALIZATION: SHX6424

## 2018-06-11 SURGERY — BREAST LUMPECTOMY WITH RADIOACTIVE SEED LOCALIZATION
Anesthesia: General | Site: Breast | Laterality: Left

## 2018-06-11 MED ORDER — PROPOFOL 10 MG/ML IV BOLUS
INTRAVENOUS | Status: DC | PRN
  Administered 2018-06-11: 80 mg via INTRAVENOUS
  Administered 2018-06-11: 20 mg via INTRAVENOUS

## 2018-06-11 MED ORDER — HYDROCODONE-ACETAMINOPHEN 5-325 MG PO TABS: 1.0000 | ORAL_TABLET | Freq: Four times a day (QID) | ORAL | 0 refills | Status: DC | PRN

## 2018-06-11 MED ORDER — BUPIVACAINE-EPINEPHRINE 0.25% -1:200000 IJ SOLN
INTRAMUSCULAR | Status: DC | PRN
  Administered 2018-06-11: 17 mL

## 2018-06-11 MED ORDER — VANCOMYCIN HCL IN DEXTROSE 1-5 GM/200ML-% IV SOLN
1000.0000 mg | Freq: Once | INTRAVENOUS | Status: AC
  Administered 2018-06-11: 1000 mg via INTRAVENOUS
  Filled 2018-06-11: qty 200

## 2018-06-11 MED ORDER — ACETAMINOPHEN 500 MG PO TABS
1000.0000 mg | ORAL_TABLET | ORAL | Status: AC
  Administered 2018-06-11: 1000 mg via ORAL
  Filled 2018-06-11: qty 2

## 2018-06-11 MED ORDER — GABAPENTIN 300 MG PO CAPS
300.0000 mg | ORAL_CAPSULE | ORAL | Status: AC
  Administered 2018-06-11: 300 mg via ORAL
  Filled 2018-06-11: qty 1

## 2018-06-11 MED ORDER — KETOROLAC TROMETHAMINE 15 MG/ML IJ SOLN: 15.0000 mg | Freq: Once | INTRAMUSCULAR | Status: DC | PRN

## 2018-06-11 MED ORDER — BUPIVACAINE-EPINEPHRINE (PF) 0.25% -1:200000 IJ SOLN
INTRAMUSCULAR | Status: AC
  Filled 2018-06-11: qty 30

## 2018-06-11 MED ORDER — ONDANSETRON HCL 4 MG/2ML IJ SOLN: 4.0000 mg | Freq: Once | INTRAMUSCULAR | Status: DC | PRN

## 2018-06-11 MED ORDER — CHLORHEXIDINE GLUCONATE CLOTH 2 % EX PADS: 6.0000 | MEDICATED_PAD | Freq: Once | CUTANEOUS | Status: DC

## 2018-06-11 MED ORDER — PHENYLEPHRINE 40 MCG/ML (10ML) SYRINGE FOR IV PUSH (FOR BLOOD PRESSURE SUPPORT)
PREFILLED_SYRINGE | INTRAVENOUS | Status: DC | PRN
  Administered 2018-06-11: 80 ug via INTRAVENOUS
  Administered 2018-06-11: 200 ug via INTRAVENOUS
  Administered 2018-06-11: 120 ug via INTRAVENOUS

## 2018-06-11 MED ORDER — LIDOCAINE 2% (20 MG/ML) 5 ML SYRINGE
INTRAMUSCULAR | Status: AC
  Filled 2018-06-11: qty 10

## 2018-06-11 MED ORDER — SODIUM CHLORIDE 0.9 % IV SOLN
INTRAVENOUS | Status: DC | PRN
  Administered 2018-06-11: 25 ug/min via INTRAVENOUS

## 2018-06-11 MED ORDER — ONDANSETRON HCL 4 MG/2ML IJ SOLN
INTRAMUSCULAR | Status: AC
  Filled 2018-06-11: qty 4

## 2018-06-11 MED ORDER — LIDOCAINE 2% (20 MG/ML) 5 ML SYRINGE
INTRAMUSCULAR | Status: DC | PRN
  Administered 2018-06-11: 80 mg via INTRAVENOUS

## 2018-06-11 MED ORDER — DEXAMETHASONE SODIUM PHOSPHATE 10 MG/ML IJ SOLN
INTRAMUSCULAR | Status: AC
  Filled 2018-06-11: qty 1

## 2018-06-11 MED ORDER — PROPOFOL 10 MG/ML IV BOLUS
INTRAVENOUS | Status: AC
  Filled 2018-06-11: qty 20

## 2018-06-11 MED ORDER — FENTANYL CITRATE (PF) 100 MCG/2ML IJ SOLN: 25.0000 ug | INTRAMUSCULAR | Status: DC | PRN

## 2018-06-11 MED ORDER — FENTANYL CITRATE (PF) 250 MCG/5ML IJ SOLN
INTRAMUSCULAR | Status: AC
  Filled 2018-06-11: qty 5

## 2018-06-11 MED ORDER — FENTANYL CITRATE (PF) 250 MCG/5ML IJ SOLN
INTRAMUSCULAR | Status: DC | PRN
  Administered 2018-06-11: 25 ug via INTRAVENOUS

## 2018-06-11 MED ORDER — LACTATED RINGERS IV SOLN
INTRAVENOUS | Status: DC
  Administered 2018-06-11: 09:00:00 via INTRAVENOUS

## 2018-06-11 MED ORDER — 0.9 % SODIUM CHLORIDE (POUR BTL) OPTIME
TOPICAL | Status: DC | PRN
  Administered 2018-06-11: 1000 mL

## 2018-06-11 MED ORDER — CEFAZOLIN SODIUM-DEXTROSE 2-4 GM/100ML-% IV SOLN: 2.0000 g | INTRAVENOUS | Status: DC

## 2018-06-11 SURGICAL SUPPLY — 38 items
APPLIER CLIP 9.375 MED OPEN (MISCELLANEOUS) ×3
BINDER BREAST LRG (GAUZE/BANDAGES/DRESSINGS) ×3 IMPLANT
CANISTER SUCT 3000ML PPV (MISCELLANEOUS) ×3 IMPLANT
CHLORAPREP W/TINT 26ML (MISCELLANEOUS) ×3 IMPLANT
CLIP APPLIE 9.375 MED OPEN (MISCELLANEOUS) ×1 IMPLANT
COVER PROBE W GEL 5X96 (DRAPES) ×3 IMPLANT
COVER SURGICAL LIGHT HANDLE (MISCELLANEOUS) ×3 IMPLANT
DERMABOND ADVANCED (GAUZE/BANDAGES/DRESSINGS) ×2
DERMABOND ADVANCED .7 DNX12 (GAUZE/BANDAGES/DRESSINGS) ×1 IMPLANT
DEVICE DUBIN SPECIMEN MAMMOGRA (MISCELLANEOUS) ×3 IMPLANT
DRAPE CHEST BREAST 15X10 FENES (DRAPES) ×3 IMPLANT
ELECT CAUTERY BLADE 6.4 (BLADE) ×3 IMPLANT
ELECT REM PT RETURN 9FT ADLT (ELECTROSURGICAL) ×3
ELECTRODE REM PT RTRN 9FT ADLT (ELECTROSURGICAL) ×1 IMPLANT
GAUZE SPONGE 4X4 12PLY STRL (GAUZE/BANDAGES/DRESSINGS) ×3 IMPLANT
GLOVE BIOGEL PI IND STRL 6.5 (GLOVE) ×2 IMPLANT
GLOVE BIOGEL PI IND STRL 8 (GLOVE) ×1 IMPLANT
GLOVE BIOGEL PI INDICATOR 6.5 (GLOVE) ×4
GLOVE BIOGEL PI INDICATOR 8 (GLOVE) ×2
GLOVE ECLIPSE 7.5 STRL STRAW (GLOVE) ×3 IMPLANT
GLOVE EUDERMIC 7 POWDERFREE (GLOVE) ×6 IMPLANT
GLOVE SURG SS PI 6.5 STRL IVOR (GLOVE) ×3 IMPLANT
GOWN STRL REUS W/ TWL LRG LVL3 (GOWN DISPOSABLE) ×1 IMPLANT
GOWN STRL REUS W/ TWL XL LVL3 (GOWN DISPOSABLE) ×2 IMPLANT
GOWN STRL REUS W/TWL LRG LVL3 (GOWN DISPOSABLE) ×2
GOWN STRL REUS W/TWL XL LVL3 (GOWN DISPOSABLE) ×4
KIT BASIN OR (CUSTOM PROCEDURE TRAY) ×3 IMPLANT
KIT MARKER MARGIN INK (KITS) ×3 IMPLANT
NEEDLE HYPO 25GX1X1/2 BEV (NEEDLE) ×3 IMPLANT
NS IRRIG 1000ML POUR BTL (IV SOLUTION) ×3 IMPLANT
PACK GENERAL/GYN (CUSTOM PROCEDURE TRAY) ×3 IMPLANT
PAD ABD 8X10 STRL (GAUZE/BANDAGES/DRESSINGS) ×3 IMPLANT
SPONGE LAP 4X18 RFD (DISPOSABLE) ×3 IMPLANT
SUT MNCRL AB 4-0 PS2 18 (SUTURE) ×3 IMPLANT
SUT SILK 2 0 SH (SUTURE) ×3 IMPLANT
SUT VIC AB 3-0 SH 18 (SUTURE) ×3 IMPLANT
SYR CONTROL 10ML LL (SYRINGE) ×3 IMPLANT
TOWEL GREEN STERILE FF (TOWEL DISPOSABLE) ×3 IMPLANT

## 2018-06-11 NOTE — Discharge Instructions (Signed)
[facility] °Office Phone Number [phone] ° °BREAST BIOPSY/ PARTIAL MASTECTOMY: POST OP INSTRUCTIONS ° °Always review your discharge instruction sheet given to you by the facility where your surgery was performed. ° °IF YOU HAVE DISABILITY OR FAMILY LEAVE FORMS, YOU MUST BRING THEM TO THE OFFICE FOR PROCESSING.  DO NOT GIVE THEM TO YOUR DOCTOR. ° °1. A prescription for pain medication may be given to you upon discharge.  Take your pain medication as prescribed, if needed.  If narcotic pain medicine is not needed, then you may take acetaminophen (Tylenol) or ibuprofen (Advil) as needed. °2. Take your usually prescribed medications unless otherwise directed °3. If you need a refill on your pain medication, please contact your pharmacy.  They will contact our office to request authorization.  Prescriptions will not be filled after 5pm or on week-ends. °4. You should eat very light the first 24 hours after surgery, such as soup, crackers, pudding, etc.  Resume your normal diet the day after surgery. °5. Most patients will experience some swelling and bruising in the breast.  Ice packs and a good support bra will help.  Swelling and bruising can take several days to resolve.  °6. It is common to experience some constipation if taking pain medication after surgery.  Increasing fluid intake and taking a stool softener will usually help or prevent this problem from occurring.  A mild laxative (Milk of Magnesia or Miralax) should be taken according to package directions if there are no bowel movements after 48 hours. °7. Unless discharge instructions indicate otherwise, you may remove your bandages 24-48 hours after surgery, and you may shower at that time.  You may have steri-strips (small skin tapes) in place directly over the incision.  These strips should be left on the skin for 7-10 days.  If your surgeon used skin glue on the incision, you may shower in 24 hours.  The glue will flake off over the  next 2-3 weeks.  Any sutures or staples will be removed at the office during your follow-up visit. °8. ACTIVITIES:  You may resume regular daily activities (gradually increasing) beginning the next day.  Wearing a good support bra or sports bra minimizes pain and swelling.  You may have sexual intercourse when it is comfortable. °a. You may drive when you no longer are taking prescription pain medication, you can comfortably wear a seatbelt, and you can safely maneuver your car and apply brakes. °b. RETURN TO WORK:  ______________________________________________________________________________________ °9. You should see your doctor in the office for a follow-up appointment approximately two weeks after your surgery.  Your doctor’s nurse will typically make your follow-up appointment when she calls you with your pathology report.  Expect your pathology report 2-3 business days after your surgery.  You may call to check if you do not hear from us after three days. °10. OTHER INSTRUCTIONS: _______________________________________________________________________________________________ _____________________________________________________________________________________________________________________________________ °_____________________________________________________________________________________________________________________________________ °_____________________________________________________________________________________________________________________________________ ° °WHEN TO CALL YOUR DOCTOR: °1. Fever over 101.0 °2. Nausea and/or vomiting. °3. Extreme swelling or bruising. °4. Continued bleeding from incision. °5. Increased pain, redness, or drainage from the incision. ° °The clinic staff is available to answer your questions during regular business hours.  Please don’t hesitate to call and ask to speak to one of the nurses for clinical concerns.  If you have a medical emergency, go to the nearest  emergency room or call 911.  A surgeon from [facility] is always on call at the hospital. ° °For further questions, please visit [email or web address]  °

## 2018-06-11 NOTE — Anesthesia Postprocedure Evaluation (Signed)
Anesthesia Post Note  Patient: Mia Contreras  Procedure(s) Performed: LEFT BREAST LUMPECTOMY WITH BRACKETED RADIOACTIVE SEED LOCALIZATION (Left Breast)     Patient location during evaluation: PACU Anesthesia Type: General Level of consciousness: awake and alert Pain management: pain level controlled Vital Signs Assessment: post-procedure vital signs reviewed and stable Respiratory status: spontaneous breathing, nonlabored ventilation, respiratory function stable and patient connected to nasal cannula oxygen Cardiovascular status: blood pressure returned to baseline and stable Postop Assessment: no apparent nausea or vomiting Anesthetic complications: no    Last Vitals:  Vitals:   06/11/18 1058 06/11/18 1100  BP: 116/74 116/74  Pulse:  70  Resp:  11  Temp:    SpO2: 97% 93%    Last Pain:  Vitals:   06/11/18 1101  PainSc: 0-No pain                 Barnet Glasgow

## 2018-06-11 NOTE — Progress Notes (Signed)
Small skin tear on right index finger when removing sticky pulse ox probe. Gauze placed on finger. Patient and friend at bedside aware

## 2018-06-11 NOTE — Anesthesia Procedure Notes (Signed)
Procedure Name: LMA Insertion Date/Time: 06/11/2018 9:34 AM Performed by: Imagene Riches, CRNA Pre-anesthesia Checklist: Patient identified, Emergency Drugs available, Suction available and Patient being monitored Patient Re-evaluated:Patient Re-evaluated prior to induction Oxygen Delivery Method: Circle System Utilized Preoxygenation: Pre-oxygenation with 100% oxygen Induction Type: IV induction Ventilation: Mask ventilation without difficulty LMA: LMA inserted LMA Size: 3.0 Number of attempts: 1 Airway Equipment and Method: Bite block Placement Confirmation: positive ETCO2 Tube secured with: Tape Dental Injury: Teeth and Oropharynx as per pre-operative assessment

## 2018-06-11 NOTE — Transfer of Care (Signed)
Immediate Anesthesia Transfer of Care Note  Patient: Mia Contreras  Procedure(s) Performed: LEFT BREAST LUMPECTOMY WITH BRACKETED RADIOACTIVE SEED LOCALIZATION (Left Breast)  Patient Location: PACU  Anesthesia Type:General  Level of Consciousness: drowsy  Airway & Oxygen Therapy: Patient Spontanous Breathing and Patient connected to nasal cannula oxygen  Post-op Assessment: Report given to RN and Post -op Vital signs reviewed and stable  Post vital signs: Reviewed and stable  Last Vitals:  Vitals Value Taken Time  BP 83/66 06/11/2018 10:31 AM  Temp    Pulse 114 06/11/2018 10:33 AM  Resp 15 06/11/2018 10:33 AM  SpO2 93 % 06/11/2018 10:33 AM  Vitals shown include unvalidated device data.  Last Pain:  Vitals:   06/11/18 0828  PainSc: 0-No pain         Complications: No apparent anesthesia complications

## 2018-06-11 NOTE — Anesthesia Postprocedure Evaluation (Signed)
Anesthesia Post Note  Patient: Mia Contreras  Procedure(s) Performed: LEFT BREAST LUMPECTOMY WITH BRACKETED RADIOACTIVE SEED LOCALIZATION (Left Breast)     Patient location during evaluation: PACU Anesthesia Type: General Level of consciousness: awake and alert Pain management: pain level controlled Vital Signs Assessment: post-procedure vital signs reviewed and stable Respiratory status: spontaneous breathing, nonlabored ventilation, respiratory function stable and patient connected to nasal cannula oxygen Cardiovascular status: blood pressure returned to baseline and stable Postop Assessment: no apparent nausea or vomiting Anesthetic complications: no    Last Vitals:  Vitals:   06/11/18 1100 06/11/18 1121  BP: 116/74 98/87  Pulse: 70 91  Resp: 11 13  Temp:  (!) 36.3 C  SpO2: 93% 93%    Last Pain:  Vitals:   06/11/18 1101  PainSc: 0-No pain                 Barnet Glasgow

## 2018-06-11 NOTE — Op Note (Signed)
Patient Name:           Mia Contreras   Date of Surgery:        06/11/2018  Pre op Diagnosis:      Recurrent cancer left breast  Post op Diagnosis:    Recurrent cancer left breast  Procedure:                 Left breast lumpectomy with double bracketed radioactive seed localization  Surgeon:                     Edsel Petrin. Dalbert Batman, M.D., FACS  Assistant:                      Or staff  Operative Indications:    This is an 84 year old female, referred to the [facility] by Emmit Pomfret at [facility] mammography for evaluation and management of recurrent cancer of the left breast. Her PCP is Dr. Alain Marion. Her cardiologist is Dr. Johnsie Cancel. She was seen in the clinic  by Dr. Burr Medico, Dr. Sondra Come and me     She developed cancer of the left breast, laterally, in 1999. Marylene Buerger performed a lumpectomy, axillary surgery and she underwent adjuvant radiation therapy and adjuvant chemotherapy. She states that she felt a lump in her left breast for about 6 months. Her recent mammograms show a 1.2 cm mass in the left breast, upper inner quadrant, posterior depth, 4.5 cm from the nipple. There was also a 4 mm mass in the left breast at the 10 o'clock position 5 cm from the nipple, very near the primary cancer. This could not be confidently found at the time of the image guided biopsy Also noted were lumpectomy changes and clips in the lateral left breast. Image guided biopsy of the left breast cancer revealed invasive ductal carcinoma, ER 95%, PR 90%, Ki-67 10%, HER-2 negative.      The patient understands that mastectomy is the standard of care here but she declines mastectomy and requests lumpectomy and adjuvant therapy. She knows that she has an undefined increased risk of local recurrence from this but is hopeful that if we follow her closely that it will not affect her survival. We agreed to proceed with left breast lumpectomy with double bracketed radioactive seed localization The tumor appears close to the  skin so I'll probably take an ellipse of skin    Operative Findings:       There was a palpable mass in the left breast upper inner quadrant.  This felt 1.5 cm in diameter.  The breast was small and atrophic.  Radiation changes within the skin and breast were apparent.  The broad anterior margin is the skin.  The broad posterior margin is the muscle.  We took an ellipse of skin to hopefully avoid a positive anterior margin in the skin.   The specimen mammogram looked good and contained the original titanium marker clip in both seeds.  Procedure in Detail:          Following the induction of general LMA anesthesia the patient's left breast was prepped and draped in a sterile fashion.  Intravenous antibiotics were given.  Surgical timeout was performed.  0.5% Marcaine with epinephrine was used as a local infiltration anesthetic.  Using the neoprobe I mapped out the area to be excised.  This was also guided by the palpable cancer.  I marked and planned a transverse elliptical incision which was probably 8 cm long and  about 2-1/2 cm wide.  This incision was made with a knife.  Lumpectomy was performed using neoprobe and cautery all the way down to the pectoralis fascia.  It felt like we got all the way around the tumor.  The specimen was marked with a silk sutures and a 6 color ink kit to orient the pathologist.  The specimen mammogram looked good as described above.  The specimen was sent to the lab where the seeds were retrieved.  The wound was irrigated.  Hemostasis excellent.  5 metal marker clips were placed in the walls of the lumpectomy cavity.  I undermined the tissue somewhat.  The breast was small and they did not mobilize that well.  Recall closed the breast tissue down to the pectoralis muscle with interrupted 3-0 Vicryl.  Superficial layer of 3-0 Vicryl's were placed to close the breast tissues and the skin was closed with a running subcuticular 4-0 Monocryl and Dermabond.  Clean bandages and a breast  binder were placed.  The patient tolerated the procedure well was taken to PACU in stable condition.  EBL 15 cc.  Counts correct.  Complications none.    Addendum: I logged onto the [employer] and reviewed her prescription medication history     Makaleigh Reinard M. Dalbert Batman, M.D., FACS General and Minimally Invasive Surgery Breast and Colorectal Surgery  06/11/2018 10:33 AM

## 2018-06-11 NOTE — Interval H&P Note (Signed)
History and Physical Interval Note:  06/11/2018 8:57 AM  Mia Contreras  has presented today for surgery, with the diagnosis of RECURRENT LEFT BREAST CANCER  The various methods of treatment have been discussed with the patient and family. After consideration of risks, benefits and other options for treatment, the patient has consented to  Procedure(s): LEFT BREAST LUMPECTOMY WITH BRACKETED RADIOACTIVE SEED LOCALIZATION (Left) as a surgical intervention .  The patient's history has been reviewed, patient examined, no change in status, stable for surgery.  I have reviewed the patient's chart and labs.  Questions were answered to the patient's satisfaction.     Adin Hector

## 2018-06-12 ENCOUNTER — Encounter (HOSPITAL_COMMUNITY): Payer: Self-pay | Admitting: General Surgery

## 2018-06-12 NOTE — Progress Notes (Signed)
Inform patient of Pathology report,. As expected, pathology report shows in situ and invasive breast cancer. Margins are clear She will not need any further surgery This is excellent news Let me know that you reached her  hmik

## 2018-06-17 ENCOUNTER — Encounter: Payer: Self-pay | Admitting: Internal Medicine

## 2018-06-17 ENCOUNTER — Telehealth: Payer: Self-pay | Admitting: Internal Medicine

## 2018-06-17 ENCOUNTER — Other Ambulatory Visit (INDEPENDENT_AMBULATORY_CARE_PROVIDER_SITE_OTHER): Payer: Medicare Other

## 2018-06-17 ENCOUNTER — Ambulatory Visit: Payer: Self-pay | Admitting: *Deleted

## 2018-06-17 ENCOUNTER — Ambulatory Visit (INDEPENDENT_AMBULATORY_CARE_PROVIDER_SITE_OTHER): Payer: Medicare Other | Admitting: Internal Medicine

## 2018-06-17 ENCOUNTER — Ambulatory Visit (INDEPENDENT_AMBULATORY_CARE_PROVIDER_SITE_OTHER)
Admission: RE | Admit: 2018-06-17 | Discharge: 2018-06-17 | Disposition: A | Discharge status: Home / Self Care | Payer: Medicare Other | Source: Ambulatory Visit | Attending: Internal Medicine | Admitting: Internal Medicine

## 2018-06-17 VITALS — BP 126/74 | HR 84 | Temp 97.6°F | Resp 18 | Ht 63.0 in | Wt 129.0 lb

## 2018-06-17 DIAGNOSIS — I5021 Acute systolic (congestive) heart failure: Secondary | ICD-10-CM

## 2018-06-17 DIAGNOSIS — R0602 Shortness of breath: Secondary | ICD-10-CM | POA: Diagnosis not present

## 2018-06-17 LAB — COMPREHENSIVE METABOLIC PANEL
ALBUMIN: 4.3 g/dL (ref 3.5–5.2)
ALT: 27 U/L (ref 0–35)
AST: 33 U/L (ref 0–37)
Alkaline Phosphatase: 79 U/L (ref 39–117)
BILIRUBIN TOTAL: 1.4 mg/dL — AB (ref 0.2–1.2)
BUN: 47 mg/dL — ABNORMAL HIGH (ref 6–23)
CALCIUM: 9.6 mg/dL (ref 8.4–10.5)
CO2: 37 meq/L — AB (ref 19–32)
CREATININE: 1.62 mg/dL — AB (ref 0.40–1.20)
Chloride: 96 mEq/L (ref 96–112)
GFR: 31.95 mL/min — ABNORMAL LOW (ref >60.00)
Glucose, Bld: 100 mg/dL — ABNORMAL HIGH (ref 70–99)
Potassium: 4.3 mEq/L (ref 3.5–5.1)
Sodium: 141 mEq/L (ref 135–145)
Total Protein: 6.6 g/dL (ref 6.0–8.3)

## 2018-06-17 LAB — BRAIN NATRIURETIC PEPTIDE: Pro B Natriuretic peptide (BNP): 1573 pg/mL — ABNORMAL HIGH (ref 0.0–100.0)

## 2018-06-17 NOTE — Assessment & Plan Note (Signed)
Symptoms of leg edema, SOB, weight gain and SOB consistent with acute on chronic CHF exacerbation Possibly secondary to increased salt intake Cmp, BNP CXR She will take one extra torsemide today and go back to her usual dosing - she may need an extra torsemide tomorrow, but has had dehydration and AKI with extra torsemide int he past so we will see how she does with just one extra dose

## 2018-06-17 NOTE — Progress Notes (Signed)
Subjective:    Patient ID: Mia Contreras, female    DOB: 03-Nov-1928, 84 y.o.   MRN: [id number]  HPI The patient is here for an acute visit.  Last Tuesday she has breast cancer surgery.  She has had multiple people bring her food over the past several days.  She also had people visiting yesterday and was on her feet a lot.    Leg swelling:  Her right leg has been swollen some for a while, but she is not able to be more specific than that.  Now both legs are now swollen - the left leg started to swell in the past couple of days.       She weighs every day at home -- she is usually 121-123,  This morning she 125.3.   She has had some increased SOB.  She also states a cough that is productive at times of clear mucus.  She is concerned about pneumonia.  She denies any fever or chills.   She is taking torsemide daily as prescribed.      Medications and allergies reviewed with patient and updated if appropriate.  Patient Active Problem List   Diagnosis Date Noted  . Malignant neoplasm of upper-inner quadrant of left breast in female, estrogen receptor positive ([place]) 05/10/2018  . Grief 02/12/2018  . Psoriasis 09/14/2017  . Liver disorder 09/13/2017  . Hematoma of leg 08/24/2017  . Myalgia 06/21/2017  . Rash and nonspecific skin eruption 05/22/2017  . Arthralgia 04/13/2017  . Vaginitis, atrophic 03/06/2017  . Symptomatic bradycardia   . Acute systolic CHF (congestive heart failure) ([place])   . Hyperkalemia 06/06/2016  . CKD (chronic kidney disease) stage 4, GFR 15-29 ml/min (HCC) 06/06/2016  . Skin tear of left forearm without complication 65/68/1275  . HCAP (healthcare-associated pneumonia) 01/30/2016  . Chronic anticoagulation-Eliquis 01/30/2016  . Hypotension-currently asymptomatic 01/30/2016  . Malnutrition of moderate degree 01/27/2016  . Falls 01/26/2016  . Hypoxia 01/26/2016  . Chronic CHF (congestive heart failure) ([place]) 01/26/2016  . Atrial fibrillation ([place]) 01/11/2016    . PAF- currently in AF on Amiodarone 200 mg BID   . Acute on chronic combined systolic and diastolic heart failure ([place]) 12/14/2015  . Acute kidney injury superimposed on chronic kidney disease ([place]) 12/14/2015  . CRF (chronic renal failure) 12/14/2015  . IBS (irritable bowel syndrome) 12/14/2015  . Open leg wound 11/08/2015  . Hiatal hernia 05/12/2015  . Subconjunctival hemorrhage 08/07/2014  . Nausea without vomiting 07/15/2014  . Noninfected skin tear of right leg 07/03/2014  . Hypoglycemia 12/12/2013  . Chronic systolic heart failure ([place]) 08/22/2013  . NICM (nonischemic cardiomyopathy) ([place]) 08/22/2013  . Diarrhea 10/01/2012  . Dyspnea on exertion 06/27/2012  . Asthmatic bronchitis 06/27/2012  . UTI (urinary tract infection) 11/02/2011  . Postoperative anemia due to acute blood loss 11/01/2011  . COPD (chronic obstructive pulmonary disease) ([place]) 08/16/2011  . Cough 08/08/2011  . Preop exam for internal medicine 08/02/2011  . Cerumen impaction 08/02/2011  . Bruit 04/19/2011  . Arrhythmia 03/29/2011  . Neoplasm of uncertain behavior of skin 09/21/2010  . PREMATURE ATRIAL CONTRACTIONS 09/21/2010  . CYSTITIS 09/21/2010  . Thyrotoxicosis 05/23/2010  . GOITER, MULTINODULAR 05/06/2010  . TOBACCO USE, QUIT 06/14/2009  . ELECTROCARDIOGRAM, ABNORMAL 04/02/2009  . SKIN CANCER, HX OF 11/12/2008  . CARPAL TUNNEL SYNDROME, RIGHT 01/11/2008  . SCOLIOSIS 01/11/2008  . VENOUS INSUFFICIENCY 09/05/2007  . Edema 09/05/2007  . Anemia in chronic kidney disease 08/20/2007  . DIVERTICULOSIS, COLON 08/20/2007  .  CELLULITIS, LEG, RIGHT 08/20/2007  . Osteoporosis 08/20/2007  . Open wound of knee, leg, and ankle 07/03/2007  . Anxiety 03/16/2007  . Essential hypertension 03/16/2007  . Osteoarthritis 03/16/2007  . hx: breast cancer, left, infiltrating ductal 03/16/2007    Current Outpatient Medications on File Prior to Visit  Medication Sig Dispense Refill  . acetaminophen (TYLENOL) 650 MG  CR tablet Take 650 mg by mouth every 8 (eight) hours as needed for pain.    Marland Kitchen ALPRAZolam (XANAX) 0.5 MG tablet Take 1 tablet (0.5 mg total) by mouth 2 (two) times daily as needed for anxiety. (Patient taking differently: Take 0.5 mg by mouth 2 (two) times daily as needed for anxiety or sleep. ) 60 tablet 3  . apixaban (ELIQUIS) 2.5 MG TABS tablet Take 1 tablet (2.5 mg total) by mouth 2 (two) times daily. 60 tablet 11  . carvedilol (COREG) 3.125 MG tablet Take 1 tablet (3.125 mg total) by mouth 2 (two) times daily with a meal. 60 tablet 11  . coal tar (NEUTROGENA T-GEL) 0.5 % shampoo Apply topically 2 (two) times a week.     . doxycycline (VIBRA-TABS) 100 MG tablet Take 1 tablet (100 mg total) by mouth 2 (two) times daily. 10 tablet 0  . HYDROcodone-acetaminophen (NORCO) 5-325 MG tablet Take 1 tablet by mouth every 6 (six) hours as needed for moderate pain or severe pain. 15 tablet 0  . ketoconazole (NIZORAL) 2 % shampoo Apply 1 application topically 2 (two) times a week.    . torsemide (DEMADEX) 20 MG tablet TAKE TWO TABLETS BY MOUTH DAILY (Patient taking differently: Take 40 mg by mouth daily. ) 180 tablet 5  . triamcinolone ointment (KENALOG) 0.1 % Apply 1 application topically 2 (two) times daily. Use qd -bid (Patient taking differently: Apply 1 application topically daily as needed (irritation). Use qd -bid) 80 g 2  . vitamin C (ASCORBIC ACID) 500 MG tablet Take 1 tablet (500 mg total) by mouth daily. 100 tablet 3   No current facility-administered medications on file prior to visit.     Past Medical History:  Diagnosis Date  . AKI (acute kidney injury) ([place]) 01/2016  . ANEMIA-NOS 08/20/2007  . ANXIETY 03/16/2007  . BREAST CANCER, HX OF 03/16/2007  . CARPAL TUNNEL SYNDROME, RIGHT 01/11/2008  . Cataract    floaters  . CELLULITIS, LEG, RIGHT 08/20/2007  . CHF (congestive heart failure) ([place])   . Collagenous colitis   . COPD (chronic obstructive pulmonary disease) ([place])   . DIVERTICULOSIS,  COLON 08/20/2007  . Dyspnea    on exertion  . Dysrhythmia    atrial fib  . GOITER, MULTINODULAR 05/06/2010   Dr [name]  . HYPERLIPIDEMIA 08/20/2007  . HYPERTENSION 03/16/2007  . HYPERTHYROIDISM 05/23/2010  . IBS (irritable bowel syndrome)   . OSTEOARTHRITIS 03/16/2007  . OSTEOPOROSIS 08/20/2007  . Pneumonia 07/2011   took abx for several weeks  . PREMATURE ATRIAL CONTRACTIONS 09/21/2010  . SCOLIOSIS 01/11/2008  . SKIN CANCER, HX OF 11/12/2008    R cheek 2010, L Cheek 2011 Dr. Tonia Brooms  . Thyroid nodule   . VENOUS INSUFFICIENCY 09/05/2007    Past Surgical History:  Procedure Laterality Date  . ABDOMINAL HYSTERECTOMY    . APPENDECTOMY  1972  . BREAST LUMPECTOMY Left 2001  . BREAST LUMPECTOMY WITH RADIOACTIVE SEED LOCALIZATION Left 06/11/2018   Procedure: LEFT BREAST LUMPECTOMY WITH BRACKETED RADIOACTIVE SEED LOCALIZATION;  Surgeon: Fanny Skates, MD;  Location: [facility];  Service: General;  Laterality: Left;  . CARPAL  TUNNEL RELEASE    . CATARACT EXTRACTION    . EYE SURGERY     cataract ext  . HERNIA REPAIR    . MASTECTOMY, PARTIAL Left   . SKIN CANCER EXCISION     face-Dr. Bing Plume  . TOTAL KNEE ARTHROPLASTY    . TOTAL KNEE ARTHROPLASTY Right 2010   Dr Para March  . TOTAL KNEE ARTHROPLASTY  10/30/2011   Procedure: TOTAL KNEE ARTHROPLASTY;  Surgeon: Lorn Junes, MD;  Location: [place] [facility];  Service: Orthopedics;  Laterality: Left;     . XRT     chemo, surgery    Social History   Socioeconomic History  . Marital status: Single    Spouse name: Not on file  . Number of children: Not on file  . Years of education: Not on file  . Highest education level: Not on file  Occupational History  . Not on file  Social Needs  . Financial resource strain: Not on file  . Food insecurity:    Worry: Not on file    Inability: Not on file  . Transportation needs:    Medical: Not on file    Non-medical: Not on file  Tobacco Use  . Smoking status: Former Smoker    Last attempt to quit:  12/20/1980    Years since quitting: 37.5  . Smokeless tobacco: Never Used  Substance and Sexual Activity  . Alcohol use: Not Currently  . Drug use: No  . Sexual activity: Not Currently  Lifestyle  . Physical activity:    Days per week: Not on file    Minutes per session: Not on file  . Stress: Not on file  Relationships  . Social connections:    Talks on phone: Not on file    Gets together: Not on file    Attends religious service: Not on file    Active member of club or organization: Not on file    Attends meetings of clubs or organizations: Not on file    Relationship status: Not on file  Other Topics Concern  . Not on file  Social History Narrative  . Not on file    Family History  Problem Relation Age of Onset  . Dementia Mother   . Heart disease Mother   . Mental retardation Mother   . Hypertension Mother   . Alzheimer's disease Mother   . Heart attack Father   . Alcohol abuse Father   . Prostate cancer Brother   . Diabetes Other   . Colon cancer Neg Hx   . Anesthesia problems Neg Hx     Review of Systems  Constitutional: Negative for chills and fever.  Respiratory: Positive for cough (clear sputum), shortness of breath (more than usual) and wheezing (some).   Cardiovascular: Positive for leg swelling. Negative for chest pain and palpitations.  Gastrointestinal: Positive for nausea (occasional). Negative for abdominal pain.  Neurological: Negative for light-headedness and headaches.  Psychiatric/Behavioral: The patient is nervous/anxious.        Objective:   Vitals:   06/17/18 1434  BP: 126/74  Pulse: 84  Resp: 18  Temp: 97.6 F (36.4 C)  SpO2: 93%   BP Readings from Last 3 Encounters:  06/17/18 126/74  06/11/18 98/87  06/07/18 132/84   Wt Readings from Last 3 Encounters:  06/17/18 129 lb (58.5 kg)  06/11/18 125 lb (56.7 kg)  06/07/18 125 lb (56.7 kg)   Body mass index is 22.85 kg/m.   Physical Exam  Constitutional: Appears  well-developed and well-nourished. No distress.  HENT:  Head: Normocephalic and atraumatic.  Neck: Neck supple. No tracheal deviation present. No thyromegaly present.  No cervical lymphadenopathy Cardiovascular: Normal rate, irregular rhythm and normal heart sounds.   No murmur heard.   2 + pitting B/L LE edema - wearing compression socks Pulmonary/Chest: Effort normal and breath sounds normal. No respiratory distress. No has no wheezes. No rales.  Skin: Skin is warm and dry. Not diaphoretic. non infected ulcer right lateral ankle w/o discharge - mild tenderness, no warmth     Assessment & Plan:    See Problem List for Assessment and Plan of chronic medical problems.

## 2018-06-17 NOTE — Telephone Encounter (Signed)
Chest xray and blood work confirm heart failure.  Her kidney function is slightly lower than last time.     How is her swelling after the one extra dose of torsemide?

## 2018-06-17 NOTE — Patient Instructions (Addendum)
Have a chest x-ray today and blood work.  Your results will be released to [place] (or called to you) after review, usually within 72hours after test completion. If any changes need to be made, you will be notified at that same time.    Medications reviewed and updated.  Changes include :   Take an extra dose of the torsemide today.  Continue with your usual dose of torsemide tomorrow.       Continue to monitor your weight    If your swelling does not improve or worsens call us or cardiology.

## 2018-06-17 NOTE — Telephone Encounter (Signed)
Called in c/o swollen feet and ankles since yesterday morning.   She is also c/o shortness of breath and a 3 lb weight gain.  She is talking in complete sentences and without difficulty.    She had surgery for breast cancer last Tuesday by Dr. Dalbert Batman.   She was on her feet a lot yesterday with company at her house. She mentioned she has CHF and kidney failure so she does not want to end up in the hospital.  I made her an appt with Dr. Quay Burow for today at 2:30.  Dr. Alain Marion her PCP was booked.    Reason for Disposition . [1] MILD difficulty breathing (e.g., minimal/no SOB at rest, SOB with walking, pulse <100) AND [2] NEW-onset or WORSE than normal  Answer Assessment - Initial Assessment Questions 1. RESPIRATORY STATUS: "Describe your breathing?" (e.g., wheezing, shortness of breath, unable to speak, severe coughing)      She is c/o having mild shortness of breath since yesterday morning.   Also swelling in both of her feet and ankles along with a 3 lb weight gain.   She mentioned have CHF and Kidney failure.   "I don't want to end up in the hospital".   2. ONSET: "When did this breathing problem begin?"      Yesterday 3. PATTERN "Does the difficult breathing come and go, or has it been constant since it started?"      Constant but mild. 4. SEVERITY: "How bad is your breathing?" (e.g., mild, moderate, severe)    - MILD: No SOB at rest, mild SOB with walking, speaks normally in sentences, can lay down, no retractions, pulse < 100.    - MODERATE: SOB at rest, SOB with minimal exertion and prefers to sit, cannot lie down flat, speaks in phrases, mild retractions, audible wheezing, pulse 100-120.    - SEVERE: Very SOB at rest, speaks in single words, struggling to breathe, sitting hunched forward, retractions, pulse > 120      Mild 5. RECURRENT SYMPTOM: "Have you had difficulty breathing before?" If so, ask: "When was the last time?" and "What happened that time?"      Yes.   I have CHF and kidney  failure. 6. CARDIAC HISTORY: "Do you have any history of heart disease?" (e.g., heart attack, angina, bypass surgery, angioplasty)      CHF 7. LUNG HISTORY: "Do you have any history of lung disease?"  (e.g., pulmonary embolus, asthma, emphysema)     No 8. CAUSE: "What do you think is causing the breathing problem?"      Being on my feet all day yesterday and I had surgery for breast cancer last Tuesday by Dr. Dalbert Batman.    9. OTHER SYMPTOMS: "Do you have any other symptoms? (e.g., dizziness, runny nose, cough, chest pain, fever)     Swollen feet and ankles, 3 lb weight gain. 10. PREGNANCY: "Is there any chance you are pregnant?" "When was your last menstrual period?"       Not asked due to age. 11. TRAVEL: "Have you traveled out of the country in the last month?" (e.g., travel history, exposures)       No  Protocols used: BREATHING DIFFICULTY-A-AH

## 2018-06-18 ENCOUNTER — Inpatient Hospital Stay (HOSPITAL_COMMUNITY)
Admission: EM | Admit: 2018-06-18 | Discharge: 2018-06-20 | DRG: 291 | Disposition: A | Discharge status: Home / Self Care | Payer: Medicare Other | Attending: Family Medicine | Admitting: Family Medicine

## 2018-06-18 ENCOUNTER — Other Ambulatory Visit: Payer: Self-pay

## 2018-06-18 ENCOUNTER — Encounter (HOSPITAL_COMMUNITY): Payer: Self-pay | Admitting: Emergency Medicine

## 2018-06-18 ENCOUNTER — Emergency Department (HOSPITAL_COMMUNITY): Payer: Medicare Other

## 2018-06-18 ENCOUNTER — Telehealth: Payer: Self-pay | Admitting: Cardiovascular Disease

## 2018-06-18 DIAGNOSIS — N289 Disorder of kidney and ureter, unspecified: Secondary | ICD-10-CM

## 2018-06-18 DIAGNOSIS — L409 Psoriasis, unspecified: Secondary | ICD-10-CM | POA: Diagnosis present

## 2018-06-18 DIAGNOSIS — I4821 Permanent atrial fibrillation: Secondary | ICD-10-CM | POA: Diagnosis not present

## 2018-06-18 DIAGNOSIS — I5043 Acute on chronic combined systolic (congestive) and diastolic (congestive) heart failure: Secondary | ICD-10-CM | POA: Diagnosis present

## 2018-06-18 DIAGNOSIS — Z853 Personal history of malignant neoplasm of breast: Secondary | ICD-10-CM

## 2018-06-18 DIAGNOSIS — F419 Anxiety disorder, unspecified: Secondary | ICD-10-CM | POA: Diagnosis present

## 2018-06-18 DIAGNOSIS — Z7901 Long term (current) use of anticoagulants: Secondary | ICD-10-CM

## 2018-06-18 DIAGNOSIS — I081 Rheumatic disorders of both mitral and tricuspid valves: Secondary | ICD-10-CM | POA: Diagnosis present

## 2018-06-18 DIAGNOSIS — Z9849 Cataract extraction status, unspecified eye: Secondary | ICD-10-CM

## 2018-06-18 DIAGNOSIS — Z820 Family history of epilepsy and other diseases of the nervous system: Secondary | ICD-10-CM

## 2018-06-18 DIAGNOSIS — Z888 Allergy status to other drugs, medicaments and biological substances status: Secondary | ICD-10-CM

## 2018-06-18 DIAGNOSIS — Z810 Family history of intellectual disabilities: Secondary | ICD-10-CM

## 2018-06-18 DIAGNOSIS — I071 Rheumatic tricuspid insufficiency: Secondary | ICD-10-CM

## 2018-06-18 DIAGNOSIS — I10 Essential (primary) hypertension: Secondary | ICD-10-CM | POA: Diagnosis present

## 2018-06-18 DIAGNOSIS — Z170 Estrogen receptor positive status [ER+]: Secondary | ICD-10-CM

## 2018-06-18 DIAGNOSIS — I5023 Acute on chronic systolic (congestive) heart failure: Secondary | ICD-10-CM | POA: Diagnosis present

## 2018-06-18 DIAGNOSIS — N184 Chronic kidney disease, stage 4 (severe): Secondary | ICD-10-CM | POA: Diagnosis present

## 2018-06-18 DIAGNOSIS — Z881 Allergy status to other antibiotic agents status: Secondary | ICD-10-CM

## 2018-06-18 DIAGNOSIS — I4891 Unspecified atrial fibrillation: Secondary | ICD-10-CM | POA: Diagnosis present

## 2018-06-18 DIAGNOSIS — C50212 Malignant neoplasm of upper-inner quadrant of left female breast: Secondary | ICD-10-CM

## 2018-06-18 DIAGNOSIS — Z85828 Personal history of other malignant neoplasm of skin: Secondary | ICD-10-CM

## 2018-06-18 DIAGNOSIS — J449 Chronic obstructive pulmonary disease, unspecified: Secondary | ICD-10-CM | POA: Diagnosis present

## 2018-06-18 DIAGNOSIS — Z8249 Family history of ischemic heart disease and other diseases of the circulatory system: Secondary | ICD-10-CM

## 2018-06-18 DIAGNOSIS — Z8042 Family history of malignant neoplasm of prostate: Secondary | ICD-10-CM

## 2018-06-18 DIAGNOSIS — I110 Hypertensive heart disease with heart failure: Secondary | ICD-10-CM | POA: Diagnosis not present

## 2018-06-18 DIAGNOSIS — Z811 Family history of alcohol abuse and dependence: Secondary | ICD-10-CM

## 2018-06-18 DIAGNOSIS — M1990 Unspecified osteoarthritis, unspecified site: Secondary | ICD-10-CM | POA: Diagnosis present

## 2018-06-18 DIAGNOSIS — K52831 Collagenous colitis: Secondary | ICD-10-CM | POA: Diagnosis present

## 2018-06-18 DIAGNOSIS — K5730 Diverticulosis of large intestine without perforation or abscess without bleeding: Secondary | ICD-10-CM | POA: Diagnosis present

## 2018-06-18 DIAGNOSIS — I130 Hypertensive heart and chronic kidney disease with heart failure and stage 1 through stage 4 chronic kidney disease, or unspecified chronic kidney disease: Secondary | ICD-10-CM | POA: Diagnosis not present

## 2018-06-18 DIAGNOSIS — D631 Anemia in chronic kidney disease: Secondary | ICD-10-CM | POA: Diagnosis present

## 2018-06-18 DIAGNOSIS — I428 Other cardiomyopathies: Secondary | ICD-10-CM | POA: Diagnosis present

## 2018-06-18 DIAGNOSIS — Z87891 Personal history of nicotine dependence: Secondary | ICD-10-CM

## 2018-06-18 DIAGNOSIS — Z885 Allergy status to narcotic agent status: Secondary | ICD-10-CM

## 2018-06-18 DIAGNOSIS — K449 Diaphragmatic hernia without obstruction or gangrene: Secondary | ICD-10-CM | POA: Diagnosis not present

## 2018-06-18 DIAGNOSIS — Z96653 Presence of artificial knee joint, bilateral: Secondary | ICD-10-CM | POA: Diagnosis present

## 2018-06-18 DIAGNOSIS — E785 Hyperlipidemia, unspecified: Secondary | ICD-10-CM | POA: Diagnosis present

## 2018-06-18 DIAGNOSIS — I272 Pulmonary hypertension, unspecified: Secondary | ICD-10-CM | POA: Diagnosis present

## 2018-06-18 DIAGNOSIS — Z66 Do not resuscitate: Secondary | ICD-10-CM | POA: Diagnosis present

## 2018-06-18 DIAGNOSIS — M810 Age-related osteoporosis without current pathological fracture: Secondary | ICD-10-CM | POA: Diagnosis present

## 2018-06-18 DIAGNOSIS — M419 Scoliosis, unspecified: Secondary | ICD-10-CM | POA: Diagnosis present

## 2018-06-18 DIAGNOSIS — Z90710 Acquired absence of both cervix and uterus: Secondary | ICD-10-CM

## 2018-06-18 HISTORY — DX: Biventricular heart failure: I50.82

## 2018-06-18 HISTORY — DX: Unspecified systolic (congestive) heart failure: I50.20

## 2018-06-18 HISTORY — DX: Rheumatic tricuspid insufficiency: I07.1

## 2018-06-18 LAB — CBC
HCT: 41.9 % (ref 36.0–46.0)
HEMOGLOBIN: 13.3 g/dL (ref 12.0–15.0)
MCH: 30.9 pg (ref 26.0–34.0)
MCHC: 31.7 g/dL (ref 30.0–36.0)
MCV: 97.4 fL (ref 80.0–100.0)
NRBC: 0 % (ref 0.0–0.2)
PLATELETS: 197 10*3/uL (ref 150–400)
RBC: 4.3 MIL/uL (ref 3.87–5.11)
RDW: 15.8 % — ABNORMAL HIGH (ref 11.5–15.5)
WBC: 7.1 10*3/uL (ref 4.0–10.5)

## 2018-06-18 LAB — I-STAT TROPONIN, ED: TROPONIN I, POC: 0.02 ng/mL (ref 0.00–0.08)

## 2018-06-18 LAB — BASIC METABOLIC PANEL
ANION GAP: 10 (ref 5–15)
BUN: 32 mg/dL — ABNORMAL HIGH (ref 8–23)
CALCIUM: 9.3 mg/dL (ref 8.9–10.3)
CO2: 31 mmol/L (ref 22–32)
Chloride: 98 mmol/L (ref 98–111)
Creatinine, Ser: 1.3 mg/dL — ABNORMAL HIGH (ref 0.44–1.00)
GFR, EST AFRICAN AMERICAN: 42 mL/min — AB (ref >60)
GFR, EST NON AFRICAN AMERICAN: 36 mL/min — AB (ref >60)
GLUCOSE: 113 mg/dL — AB (ref 70–99)
POTASSIUM: 4.1 mmol/L (ref 3.5–5.1)
Sodium: 139 mmol/L (ref 135–145)

## 2018-06-18 LAB — BRAIN NATRIURETIC PEPTIDE: B Natriuretic Peptide: 1407.4 pg/mL — ABNORMAL HIGH (ref 0.0–100.0)

## 2018-06-18 MED ORDER — FUROSEMIDE 10 MG/ML IJ SOLN
80.0000 mg | Freq: Once | INTRAMUSCULAR | Status: AC
  Administered 2018-06-19: 80 mg via INTRAVENOUS
  Filled 2018-06-18: qty 8

## 2018-06-18 NOTE — ED Triage Notes (Signed)
Pt presents to ED for assessment of bilateral lower limb swelling with a hx of CHF and Stage IV Kidney Failure.  Patient denies SOB or chest pain.

## 2018-06-18 NOTE — Telephone Encounter (Signed)
Patient has no voicemail to leave message on mobile phone. Called home phone, no answer. Will try again later.   Patient called back. Patient complaining of BLE edema for the past 3 days and has not gotten better with Torsemide. Patient's BNP is elevated as well at 1573 pg/ml, creatinine 1.62, and BUN 47. Encouraged patient to elevated her BLE and wear her TED hose. Patient sounds very anxious over the phone and she is upset that her feet are so swollen. Patient saw Dr. Quay Burow yesterday, who told her to take the extra Torsemide for one day. Patient stated that Dr. Quay Burow office told her to follow up with her cardiologist. Will forward to Dr. Johnsie Cancel for advisement.

## 2018-06-18 NOTE — Telephone Encounter (Signed)
She should probably go to ER. She has significant pulmonary HTN and needs updated echo last one was 2017 She has azotemia and elevated Cr and will be hard to manage diuresis as outpatient.

## 2018-06-18 NOTE — ED Notes (Signed)
Patient's daughter updated on delays and offered to create bed on bench for patient.  States she is tired of sitting up.  Updated on patient being prioritized for next available room and waiting for a discharge

## 2018-06-18 NOTE — ED Provider Notes (Signed)
[facility] EMERGENCY DEPARTMENT Provider Note   CSN: [id number] Arrival date & time: 06/18/18  1841     History   Chief Complaint Chief Complaint  Patient presents with  . Foot Swelling    HPI Mia Contreras is a 84 y.o. female.  The history is provided by the patient.  She has a history of congestive heart failure, COPD, atrial fibrillation, hypertension, hyperlipidemia and recently had surgery for breast cancer and comes in with swelling in her legs and shortness of breath.  She was admitted for mastectomy 1 week ago.  Since discharge, she has gained about 4 pounds and is noted increased swelling in her legs.  She normally has dyspnea on exertion, but dyspnea is clearly worse since her surgery.  She did see her PCP yesterday who advised her to take an extra dose of torsemide.  She took an extra 20 mg of torsemide yesterday afternoon, but has noted no improvement.  She is not having any chest pain, heaviness, tightness, pressure.  There is no nausea or vomiting.  Past Medical History:  Diagnosis Date  . AKI (acute kidney injury) ([place]) 01/2016  . ANEMIA-NOS 08/20/2007  . ANXIETY 03/16/2007  . BREAST CANCER, HX OF 03/16/2007  . CARPAL TUNNEL SYNDROME, RIGHT 01/11/2008  . Cataract    floaters  . CELLULITIS, LEG, RIGHT 08/20/2007  . CHF (congestive heart failure) ([place])   . Collagenous colitis   . COPD (chronic obstructive pulmonary disease) ([place])   . DIVERTICULOSIS, COLON 08/20/2007  . Dyspnea    on exertion  . Dysrhythmia    atrial fib  . GOITER, MULTINODULAR 05/06/2010   Dr [name]  . HYPERLIPIDEMIA 08/20/2007  . HYPERTENSION 03/16/2007  . HYPERTHYROIDISM 05/23/2010  . IBS (irritable bowel syndrome)   . OSTEOARTHRITIS 03/16/2007  . OSTEOPOROSIS 08/20/2007  . Pneumonia 07/2011   took abx for several weeks  . PREMATURE ATRIAL CONTRACTIONS 09/21/2010  . SCOLIOSIS 01/11/2008  . SKIN CANCER, HX OF 11/12/2008    R cheek 2010, L Cheek 2011 Dr. Tonia Brooms  . Thyroid  nodule   . VENOUS INSUFFICIENCY 09/05/2007    Patient Active Problem List   Diagnosis Date Noted  . Malignant neoplasm of upper-inner quadrant of left breast in female, estrogen receptor positive ([place]) 05/10/2018  . Grief 02/12/2018  . Psoriasis 09/14/2017  . Liver disorder 09/13/2017  . Hematoma of leg 08/24/2017  . Myalgia 06/21/2017  . Rash and nonspecific skin eruption 05/22/2017  . Arthralgia 04/13/2017  . Vaginitis, atrophic 03/06/2017  . Symptomatic bradycardia   . Acute systolic CHF (congestive heart failure) ([place])   . Hyperkalemia 06/06/2016  . CKD (chronic kidney disease) stage 4, GFR 15-29 ml/min (H[facility]) 06/06/2016  . Skin tear of left forearm without complication 41/93/7902  . HCAP (healthcare-associated pneumonia) 01/30/2016  . Chronic anticoagulation-Eliquis 01/30/2016  . Hypotension-currently asymptomatic 01/30/2016  . Malnutrition of moderate degree 01/27/2016  . Falls 01/26/2016  . Hypoxia 01/26/2016  . Chronic CHF (congestive heart failure) ([place]) 01/26/2016  . Atrial fibrillation ([place]) 01/11/2016  . PAF- currently in AF on Amiodarone 200 mg BID   . Acute on chronic combined systolic and diastolic heart failure ([place]) 12/14/2015  . Acute kidney injury superimposed on chronic kidney disease ([place]) 12/14/2015  . CRF (chronic renal failure) 12/14/2015  . IBS (irritable bowel syndrome) 12/14/2015  . Open leg wound 11/08/2015  . Hiatal hernia 05/12/2015  . Subconjunctival hemorrhage 08/07/2014  . Nausea without vomiting 07/15/2014  . Noninfected skin tear of right leg  07/03/2014  . Hypoglycemia 12/12/2013  . Chronic systolic heart failure ([place]) 08/22/2013  . NICM (nonischemic cardiomyopathy) ([place]) 08/22/2013  . Diarrhea 10/01/2012  . Dyspnea on exertion 06/27/2012  . Asthmatic bronchitis 06/27/2012  . UTI (urinary tract infection) 11/02/2011  . Postoperative anemia due to acute blood loss 11/01/2011  . COPD (chronic obstructive pulmonary disease) ([place]) 08/16/2011    . Cough 08/08/2011  . Preop exam for internal medicine 08/02/2011  . Cerumen impaction 08/02/2011  . Bruit 04/19/2011  . Arrhythmia 03/29/2011  . Neoplasm of uncertain behavior of skin 09/21/2010  . PREMATURE ATRIAL CONTRACTIONS 09/21/2010  . CYSTITIS 09/21/2010  . Thyrotoxicosis 05/23/2010  . GOITER, MULTINODULAR 05/06/2010  . TOBACCO USE, QUIT 06/14/2009  . ELECTROCARDIOGRAM, ABNORMAL 04/02/2009  . SKIN CANCER, HX OF 11/12/2008  . CARPAL TUNNEL SYNDROME, RIGHT 01/11/2008  . SCOLIOSIS 01/11/2008  . VENOUS INSUFFICIENCY 09/05/2007  . Edema 09/05/2007  . Anemia in chronic kidney disease 08/20/2007  . DIVERTICULOSIS, COLON 08/20/2007  . CELLULITIS, LEG, RIGHT 08/20/2007  . Osteoporosis 08/20/2007  . Open wound of knee, leg, and ankle 07/03/2007  . Anxiety 03/16/2007  . Essential hypertension 03/16/2007  . Osteoarthritis 03/16/2007  . hx: breast cancer, left, infiltrating ductal 03/16/2007    Past Surgical History:  Procedure Laterality Date  . ABDOMINAL HYSTERECTOMY    . APPENDECTOMY  1972  . BREAST LUMPECTOMY Left 2001  . BREAST LUMPECTOMY WITH RADIOACTIVE SEED LOCALIZATION Left 06/11/2018   Procedure: LEFT BREAST LUMPECTOMY WITH BRACKETED RADIOACTIVE SEED LOCALIZATION;  Surgeon: Fanny Skates, MD;  Location: [facility];  Service: General;  Laterality: Left;  . CARPAL TUNNEL RELEASE    . CATARACT EXTRACTION    . EYE SURGERY     cataract ext  . HERNIA REPAIR    . MASTECTOMY, PARTIAL Left   . SKIN CANCER EXCISION     face-Dr. Bing Plume  . TOTAL KNEE ARTHROPLASTY    . TOTAL KNEE ARTHROPLASTY Right 2010   Dr Para March  . TOTAL KNEE ARTHROPLASTY  10/30/2011   Procedure: TOTAL KNEE ARTHROPLASTY;  Surgeon: Lorn Junes, MD;  Location: [facility];  Service: Orthopedics;  Laterality: Left;     . XRT     chemo, surgery     OB History   None      Home Medications    Prior to Admission medications   Medication Sig Start Date End Date Taking? Authorizing Provider  acetaminophen  (TYLENOL) 650 MG CR tablet Take 650 mg by mouth every 8 (eight) hours as needed for pain.    [provider]  ALPRAZolam Duanne Moron) 0.5 MG tablet Take 1 tablet (0.5 mg total) by mouth 2 (two) times daily as needed for anxiety. Patient taking differently: Take 0.5 mg by mouth 2 (two) times daily as needed for anxiety or sleep.  02/12/18   Plotnikov, Evie Lacks, MD  apixaban (ELIQUIS) 2.5 MG TABS tablet Take 1 tablet (2.5 mg total) by mouth 2 (two) times daily. 03/12/18   Bensimhon, Shaune Pascal, MD  carvedilol (COREG) 3.125 MG tablet Take 1 tablet (3.125 mg total) by mouth 2 (two) times daily with a meal. 02/11/18   Josue Hector, MD  coal tar (NEUTROGENA T-GEL) 0.5 % shampoo Apply topically 2 (two) times a week.     [provider]  doxycycline (VIBRA-TABS) 100 MG tablet Take 1 tablet (100 mg total) by mouth 2 (two) times daily. 06/07/18   Marrian Salvage, FNP  HYDROcodone-acetaminophen (NORCO) 5-325 MG tablet Take 1 tablet by mouth every 6 (  six) hours as needed for moderate pain or severe pain. 06/11/18   Fanny Skates, MD  ketoconazole (NIZORAL) 2 % shampoo Apply 1 application topically 2 (two) times a week.    [provider]  torsemide (DEMADEX) 20 MG tablet TAKE TWO TABLETS BY MOUTH DAILY Patient taking differently: Take 40 mg by mouth daily.  04/30/18   Plotnikov, Evie Lacks, MD  triamcinolone ointment (KENALOG) 0.1 % Apply 1 application topically 2 (two) times daily. Use qd -bid Patient taking differently: Apply 1 application topically daily as needed (irritation). Use qd -bid 05/06/18   Plotnikov, Evie Lacks, MD  vitamin C (ASCORBIC ACID) 500 MG tablet Take 1 tablet (500 mg total) by mouth daily. 03/14/16   Plotnikov, Evie Lacks, MD    Family History Family History  Problem Relation Age of Onset  . Dementia Mother   . Heart disease Mother   . Mental retardation Mother   . Hypertension Mother   . Alzheimer's disease Mother   . Heart attack Father   . Alcohol abuse  Father   . Prostate cancer Brother   . Diabetes Other   . Colon cancer Neg Hx   . Anesthesia problems Neg Hx     Social History Social History   Tobacco Use  . Smoking status: Former Smoker    Last attempt to quit: 12/20/1980    Years since quitting: 37.5  . Smokeless tobacco: Never Used  Substance Use Topics  . Alcohol use: Not Currently  . Drug use: No     Allergies   Cefuroxime; Celecoxib; Deltasone [prednisone]; Ranitidine; Spironolactone; Tapazole [methimazole]; Tramadol hcl; and Doxycycline   Review of Systems Review of Systems  All other systems reviewed and are negative.    Physical Exam Updated Vital Signs BP (!) 134/95 (BP Location: Right Arm)   Pulse 60   Temp 97.6 F (36.4 C) (Oral)   Resp 18   SpO2 92%   Physical Exam  Nursing note and vitals reviewed.  84 year old female, resting comfortably and in no acute distress. Vital signs are significant for elevated diastolic blood pressure. Oxygen saturation is 92%, which is normal. Head is normocephalic and atraumatic. PERRLA, EOMI. Oropharynx is clear. Neck is nontender and supple without adenopathy or JVD. Back is nontender and there is no CVA tenderness.  There is 1+ presacral edema. Lungs are clear without rales, wheezes, or rhonchi. Chest is nontender.  Left mastectomy scar is healing well without signs of infection. Heart has an irregular rhythm without murmur. Abdomen is soft, flat, nontender without masses or hepatosplenomegaly and peristalsis is normoactive. Extremities have 2+ pretibial edema, full range of motion is present. Skin is warm and dry without rash. Neurologic: Mental status is normal, cranial nerves are intact, there are no motor or sensory deficits.  ED Treatments / Results  Labs (all labs ordered are listed, but only abnormal results are displayed) Labs Reviewed  BASIC METABOLIC PANEL - Abnormal; Notable for the following components:      Result Value   Glucose, Bld 113 (*)     BUN 32 (*)    Creatinine, Ser 1.30 (*)    GFR calc non Af Amer 36 (*)    GFR calc Af Amer 42 (*)    All other components within normal limits  CBC - Abnormal; Notable for the following components:   RDW 15.8 (*)    All other components within normal limits  BRAIN NATRIURETIC PEPTIDE - Abnormal; Notable for the following components:  B Natriuretic Peptide 1,407.4 (*)    All other components within normal limits  I-STAT TROPONIN, ED    EKG EKG Interpretation  Date/Time:  Tuesday June 18 2018 19:02:52 EDT Ventricular Rate:  90 PR Interval:    QRS Duration: 100 QT Interval:  392 QTC Calculation: 479 R Axis:   -87 Text Interpretation:  Atrial fibrillation with premature ventricular or aberrantly conducted complexes Left axis deviation Low voltage QRS Inferior infarct , age undetermined Cannot rule out Anteroseptal infarct , age undetermined Abnormal ECG When compared with ECG of 06/07/2016, Atrial fibrillation has replaced Sinus rhythm nonspecific intraventricular conduction delay has improved Confirmed by Delora Fuel ([id number]) on 06/18/2018 11:21:49 PM   Radiology Dg Chest 2 View  Result Date: 06/18/2018 CLINICAL DATA:  Bilateral lower extremity swelling for 3 days. CHF. Recent left breast lumpectomy. EXAM: CHEST - 2 VIEW COMPARISON:  06/17/2018 FINDINGS: The cardiac silhouette remains enlarged. Aortic atherosclerosis and a large hiatal hernia are again noted. There is mild chronic interstitial coarsening which is likely related to patient's history of COPD. No airspace consolidation, overt pulmonary edema, pleural effusion, or pneumothorax is identified. Surgical clips project over the left breast and axilla. Degenerative changes are noted at the shoulders, and there is severe lumbar levoscoliosis. IMPRESSION: 1. No active cardiopulmonary disease. 2. Cardiomegaly, COPD, and large hiatal hernia. Electronically Signed   By: Logan Bores M.D.   On: 06/18/2018 20:17   Dg Chest 2  View  Result Date: 06/17/2018 CLINICAL DATA:  Leg swelling, increased shortness of breath EXAM: CHEST - 2 VIEW COMPARISON:  06/06/2016 FINDINGS: There is bilateral mild interstitial thickening. There are trace bilateral pleural effusions. There is no focal consolidation. There is no pneumothorax. There is stable cardiomegaly. There is thoracic aortic atherosclerosis. There is a large hiatal hernia. The osseous structures are unremarkable. IMPRESSION: Cardiomegaly with mild pulmonary vascular congestion. Electronically Signed   By: Kathreen Devoid   On: 06/17/2018 16:23    Procedures Procedures (including critical care time)  Medications Ordered in ED Medications  furosemide (LASIX) injection 80 mg (has no administration in time range)     Initial Impression / Assessment and Plan / ED Course  I have reviewed the triage vital signs and the nursing notes.  Pertinent labs & imaging results that were available during my care of the patient were reviewed by me and considered in my medical decision making (see chart for details).  CHF exacerbation.  Chest x-ray shows cardiomegaly without clear pulmonary vascular congestion.  However, she has developed significant edema.  Old records reviewed confirming recent surgery for breast cancer, office visit for leg swelling yesterday.  Her usual dose of torsemide is 40 mg in the morning, and she took an extra 20 mg dose yesterday afternoon.  She will need intravenous diuretics to achieve adequate diuresis.  BNP is come back markedly elevated.  Troponin is normal.  Mild renal insufficiency is present and is actually improved over baseline.  She is given a dose of intravenous furosemide and will need to be admitted to achieve diuresis.  Case is discussed with Dr. Alcario Drought of [facility] hospitalist who agrees to admit the patient.  Of note, she is anticoagulated on rivaroxaban because of her chronic atrial fibrillation, therefore does not need evaluation for pulmonary  embolism.  CHA2DS2/VAS Stroke Risk Points  Current as of 6 minutes ago     5 >= 2 Points: High Risk  1 - 1.99 Points: Medium Risk  0 Points: Low Risk  This is the only CHA2DS2/VAS Stroke Risk Points available for the past  year.:  Last Change: N/A     Details    This score determines the patient's risk of having a stroke if the  patient has atrial fibrillation.       Points Metrics  1 Has Congestive Heart Failure:  Yes    Current as of 6 minutes ago  0 Has Vascular Disease:  No    Current as of 6 minutes ago  1 Has Hypertension:  Yes    Current as of 6 minutes ago  2 Age:  61    Current as of 6 minutes ago  0 Has Diabetes:  No    Current as of 6 minutes ago  0 Had Stroke:  No  Had TIA:  No  Had thromboembolism:  No    Current as of 6 minutes ago  1 Female:  Yes    Current as of 6 minutes ago   Final Clinical Impressions(s) / ED Diagnoses   Final diagnoses:  Acute on chronic systolic heart failure (HCC)  Permanent atrial fibrillation  Renal insufficiency  Chronic anticoagulation    ED Discharge Orders    None       Delora Fuel, MD 59/97/74 Laureen Abrahams

## 2018-06-18 NOTE — ED Notes (Signed)
Patient up to desk, updated on delays and wait times and explained quite a bit about patient's POC.  Verbalized understanding and went back to waiting

## 2018-06-18 NOTE — ED Notes (Signed)
Results reviewed.  Will get patient to next available room due to BNP result

## 2018-06-18 NOTE — Telephone Encounter (Signed)
Pt aware of results 

## 2018-06-18 NOTE — Telephone Encounter (Signed)
New message  Pt c/o swelling: STAT is pt has developed SOB within 24 hours  1) How much weight have you gained and in what time span? 2 lbs, swelling started 06/11/2018  2) If swelling, where is the swelling located? Feet and calves   Are you currently taking a fluid pill? Yes  3) Are you currently SOB?   4) Do you have a log of your daily weights (if so, list)? Yes, can provide if needed   5) Have you gained 3 pounds in a day or 5 pounds in a week? No   6) Have you traveled recently? No

## 2018-06-18 NOTE — Telephone Encounter (Signed)
Called patient back with Dr. Kyla Balzarine advisement. Patient stated she would get someone to take her to the ER. Encouraged patient to try to think positive and that the hospital would help get the fluid off her while keeping a close eye on her. Patient verbalized understanding.

## 2018-06-19 ENCOUNTER — Other Ambulatory Visit: Payer: Self-pay

## 2018-06-19 ENCOUNTER — Encounter (HOSPITAL_COMMUNITY): Payer: Self-pay | Admitting: Family Medicine

## 2018-06-19 ENCOUNTER — Observation Stay (HOSPITAL_BASED_OUTPATIENT_CLINIC_OR_DEPARTMENT_OTHER): Payer: Medicare Other

## 2018-06-19 DIAGNOSIS — Z8249 Family history of ischemic heart disease and other diseases of the circulatory system: Secondary | ICD-10-CM | POA: Diagnosis not present

## 2018-06-19 DIAGNOSIS — C50212 Malignant neoplasm of upper-inner quadrant of left female breast: Secondary | ICD-10-CM | POA: Diagnosis present

## 2018-06-19 DIAGNOSIS — Z170 Estrogen receptor positive status [ER+]: Secondary | ICD-10-CM

## 2018-06-19 DIAGNOSIS — E785 Hyperlipidemia, unspecified: Secondary | ICD-10-CM | POA: Diagnosis present

## 2018-06-19 DIAGNOSIS — K5730 Diverticulosis of large intestine without perforation or abscess without bleeding: Secondary | ICD-10-CM | POA: Diagnosis present

## 2018-06-19 DIAGNOSIS — I4891 Unspecified atrial fibrillation: Secondary | ICD-10-CM | POA: Diagnosis not present

## 2018-06-19 DIAGNOSIS — I4821 Permanent atrial fibrillation: Secondary | ICD-10-CM

## 2018-06-19 DIAGNOSIS — J449 Chronic obstructive pulmonary disease, unspecified: Secondary | ICD-10-CM | POA: Diagnosis present

## 2018-06-19 DIAGNOSIS — I071 Rheumatic tricuspid insufficiency: Secondary | ICD-10-CM

## 2018-06-19 DIAGNOSIS — L409 Psoriasis, unspecified: Secondary | ICD-10-CM | POA: Diagnosis present

## 2018-06-19 DIAGNOSIS — I5023 Acute on chronic systolic (congestive) heart failure: Secondary | ICD-10-CM | POA: Diagnosis not present

## 2018-06-19 DIAGNOSIS — D631 Anemia in chronic kidney disease: Secondary | ICD-10-CM | POA: Diagnosis present

## 2018-06-19 DIAGNOSIS — I5020 Unspecified systolic (congestive) heart failure: Secondary | ICD-10-CM

## 2018-06-19 DIAGNOSIS — N183 Chronic kidney disease, stage 3 (moderate): Secondary | ICD-10-CM

## 2018-06-19 DIAGNOSIS — Z7901 Long term (current) use of anticoagulants: Secondary | ICD-10-CM | POA: Diagnosis not present

## 2018-06-19 DIAGNOSIS — I130 Hypertensive heart and chronic kidney disease with heart failure and stage 1 through stage 4 chronic kidney disease, or unspecified chronic kidney disease: Secondary | ICD-10-CM | POA: Diagnosis present

## 2018-06-19 DIAGNOSIS — Z810 Family history of intellectual disabilities: Secondary | ICD-10-CM | POA: Diagnosis not present

## 2018-06-19 DIAGNOSIS — I5082 Biventricular heart failure: Secondary | ICD-10-CM | POA: Diagnosis not present

## 2018-06-19 DIAGNOSIS — N184 Chronic kidney disease, stage 4 (severe): Secondary | ICD-10-CM | POA: Diagnosis not present

## 2018-06-19 DIAGNOSIS — I480 Paroxysmal atrial fibrillation: Secondary | ICD-10-CM | POA: Diagnosis not present

## 2018-06-19 DIAGNOSIS — I340 Nonrheumatic mitral (valve) insufficiency: Secondary | ICD-10-CM

## 2018-06-19 DIAGNOSIS — Z9849 Cataract extraction status, unspecified eye: Secondary | ICD-10-CM | POA: Diagnosis not present

## 2018-06-19 DIAGNOSIS — I428 Other cardiomyopathies: Secondary | ICD-10-CM | POA: Diagnosis present

## 2018-06-19 DIAGNOSIS — M810 Age-related osteoporosis without current pathological fracture: Secondary | ICD-10-CM | POA: Diagnosis present

## 2018-06-19 DIAGNOSIS — I5043 Acute on chronic combined systolic (congestive) and diastolic (congestive) heart failure: Secondary | ICD-10-CM | POA: Diagnosis present

## 2018-06-19 DIAGNOSIS — Z853 Personal history of malignant neoplasm of breast: Secondary | ICD-10-CM | POA: Diagnosis not present

## 2018-06-19 DIAGNOSIS — Z85828 Personal history of other malignant neoplasm of skin: Secondary | ICD-10-CM | POA: Diagnosis not present

## 2018-06-19 DIAGNOSIS — I10 Essential (primary) hypertension: Secondary | ICD-10-CM | POA: Diagnosis not present

## 2018-06-19 DIAGNOSIS — M419 Scoliosis, unspecified: Secondary | ICD-10-CM | POA: Diagnosis present

## 2018-06-19 DIAGNOSIS — Z90710 Acquired absence of both cervix and uterus: Secondary | ICD-10-CM | POA: Diagnosis not present

## 2018-06-19 DIAGNOSIS — Z96653 Presence of artificial knee joint, bilateral: Secondary | ICD-10-CM | POA: Diagnosis present

## 2018-06-19 DIAGNOSIS — M1990 Unspecified osteoarthritis, unspecified site: Secondary | ICD-10-CM | POA: Diagnosis present

## 2018-06-19 DIAGNOSIS — F419 Anxiety disorder, unspecified: Secondary | ICD-10-CM | POA: Diagnosis present

## 2018-06-19 HISTORY — DX: Rheumatic tricuspid insufficiency: I07.1

## 2018-06-19 HISTORY — DX: Unspecified systolic (congestive) heart failure: I50.20

## 2018-06-19 LAB — ECHOCARDIOGRAM COMPLETE
Height: 63 in
Weight: 1988.8 oz

## 2018-06-19 MED ORDER — SODIUM CHLORIDE 0.9% FLUSH: 3.0000 mL | INTRAVENOUS | Status: DC | PRN

## 2018-06-19 MED ORDER — CARVEDILOL 3.125 MG PO TABS
3.1250 mg | ORAL_TABLET | Freq: Two times a day (BID) | ORAL | Status: DC
  Administered 2018-06-19 – 2018-06-20 (×3): 3.125 mg via ORAL
  Filled 2018-06-19 (×4): qty 1

## 2018-06-19 MED ORDER — HYDROCODONE-ACETAMINOPHEN 5-325 MG PO TABS: 1.0000 | ORAL_TABLET | Freq: Four times a day (QID) | ORAL | Status: DC | PRN

## 2018-06-19 MED ORDER — SODIUM CHLORIDE 0.9% FLUSH
3.0000 mL | Freq: Two times a day (BID) | INTRAVENOUS | Status: DC
  Administered 2018-06-19 – 2018-06-20 (×4): 3 mL via INTRAVENOUS

## 2018-06-19 MED ORDER — APIXABAN 2.5 MG PO TABS: 2.5000 mg | ORAL_TABLET | Freq: Two times a day (BID) | ORAL | Status: DC

## 2018-06-19 MED ORDER — APIXABAN 2.5 MG PO TABS
2.5000 mg | ORAL_TABLET | Freq: Two times a day (BID) | ORAL | Status: DC
  Administered 2018-06-19 – 2018-06-20 (×3): 2.5 mg via ORAL
  Filled 2018-06-19 (×3): qty 1

## 2018-06-19 MED ORDER — FUROSEMIDE 10 MG/ML IJ SOLN
80.0000 mg | Freq: Every day | INTRAMUSCULAR | Status: DC
  Administered 2018-06-19 – 2018-06-20 (×2): 80 mg via INTRAVENOUS
  Filled 2018-06-19 (×2): qty 8

## 2018-06-19 MED ORDER — GUAIFENESIN-DM 100-10 MG/5ML PO SYRP
5.0000 mL | ORAL_SOLUTION | ORAL | Status: DC | PRN
  Administered 2018-06-19 – 2018-06-20 (×4): 5 mL via ORAL
  Filled 2018-06-19 (×4): qty 5

## 2018-06-19 MED ORDER — LORATADINE 10 MG PO TABS
10.0000 mg | ORAL_TABLET | Freq: Every day | ORAL | Status: DC | PRN
  Administered 2018-06-19: 10 mg via ORAL
  Filled 2018-06-19: qty 1

## 2018-06-19 MED ORDER — ONDANSETRON HCL 4 MG/2ML IJ SOLN: 4.0000 mg | Freq: Four times a day (QID) | INTRAMUSCULAR | Status: DC | PRN

## 2018-06-19 MED ORDER — TRIAMCINOLONE ACETONIDE 0.1 % EX OINT
1.0000 "application " | TOPICAL_OINTMENT | Freq: Every day | CUTANEOUS | Status: DC
  Administered 2018-06-19: 1 via TOPICAL
  Filled 2018-06-19: qty 15

## 2018-06-19 MED ORDER — SODIUM CHLORIDE 0.9 % IV SOLN: 250.0000 mL | INTRAVENOUS | Status: DC | PRN

## 2018-06-19 MED ORDER — ALPRAZOLAM 0.25 MG PO TABS
0.5000 mg | ORAL_TABLET | Freq: Two times a day (BID) | ORAL | Status: DC | PRN
  Administered 2018-06-19 – 2018-06-20 (×3): 0.5 mg via ORAL
  Filled 2018-06-19 (×3): qty 2

## 2018-06-19 MED ORDER — ACETAMINOPHEN 325 MG PO TABS: 650.0000 mg | ORAL_TABLET | ORAL | Status: DC | PRN

## 2018-06-19 NOTE — Plan of Care (Signed)
  Problem: Clinical Measurements: Goal: Respiratory complications will improve Outcome: Progressing Goal: Cardiovascular complication will be avoided Outcome: Progressing   Problem: Activity: Goal: Risk for activity intolerance will decrease Outcome: Progressing   Problem: Elimination: Goal: Will not experience complications related to bowel motility Outcome: Progressing Goal: Will not experience complications related to urinary retention Outcome: Progressing   Problem: Pain Managment: Goal: General experience of comfort will improve Outcome: Progressing   Problem: Safety: Goal: Ability to remain free from injury will improve Outcome: Progressing

## 2018-06-19 NOTE — Discharge Instructions (Signed)

## 2018-06-19 NOTE — Progress Notes (Signed)
  Echocardiogram 2D Echocardiogram has been performed.  Mia Contreras 06/19/2018, 11:05 AM

## 2018-06-19 NOTE — H&P (Signed)
History and Physical    Mia Contreras:[id number] DOB: May 21, 1929 DOA: 06/18/2018  PCP: Cassandria Anger, MD  Patient coming from: Home  I have personally briefly reviewed patient's old medical records in [place]  Chief Complaint: Leg swelling  HPI: Mia Contreras is a 84 y.o. female with medical history significant of CHF, PAH, A.fib, HTN, BRCA s/p lumpectomy this past week.  Since the time of surgery she reports she gained 4lbs wt, has leg swelling, dry cough, dyspnea.  This has been persistent.  Saw PCP yesterday who recd extra dose of torsemide which she says she took but didn't help much.  Called cards today, Dr. Johnsie Cancel recd her going to ED (see his note).   ED Course: BNP 1400, Creat 1.3, CXR yesterday showing mild pulm vasc congestion, today being read as neg.  Given '80mg'$  lasix in ED.   Review of Systems: As per HPI otherwise 10 point review of systems negative.   Past Medical History:  Diagnosis Date  . AKI (acute kidney injury) ([place]) 01/2016  . ANEMIA-NOS 08/20/2007  . ANXIETY 03/16/2007  . BREAST CANCER, HX OF 03/16/2007  . CARPAL TUNNEL SYNDROME, RIGHT 01/11/2008  . Cataract    floaters  . CELLULITIS, LEG, RIGHT 08/20/2007  . CHF (congestive heart failure) ([place])   . Collagenous colitis   . COPD (chronic obstructive pulmonary disease) ([place])   . DIVERTICULOSIS, COLON 08/20/2007  . Dyspnea    on exertion  . Dysrhythmia    atrial fib  . GOITER, MULTINODULAR 05/06/2010   Dr [name]  . HYPERLIPIDEMIA 08/20/2007  . HYPERTENSION 03/16/2007  . HYPERTHYROIDISM 05/23/2010  . IBS (irritable bowel syndrome)   . OSTEOARTHRITIS 03/16/2007  . OSTEOPOROSIS 08/20/2007  . Pneumonia 07/2011   took abx for several weeks  . PREMATURE ATRIAL CONTRACTIONS 09/21/2010  . SCOLIOSIS 01/11/2008  . SKIN CANCER, HX OF 11/12/2008    R cheek 2010, L Cheek 2011 Dr. Tonia Brooms  . Thyroid nodule   . VENOUS INSUFFICIENCY 09/05/2007    Past Surgical History:  Procedure  Laterality Date  . ABDOMINAL HYSTERECTOMY    . APPENDECTOMY  1972  . BREAST LUMPECTOMY Left 2001  . BREAST LUMPECTOMY WITH RADIOACTIVE SEED LOCALIZATION Left 06/11/2018   Procedure: LEFT BREAST LUMPECTOMY WITH BRACKETED RADIOACTIVE SEED LOCALIZATION;  Surgeon: Fanny Skates, MD;  Location: [facility];  Service: General;  Laterality: Left;  . CARPAL TUNNEL RELEASE    . CATARACT EXTRACTION    . EYE SURGERY     cataract ext  . HERNIA REPAIR    . MASTECTOMY, PARTIAL Left   . SKIN CANCER EXCISION     face-Dr. Bing Plume  . TOTAL KNEE ARTHROPLASTY    . TOTAL KNEE ARTHROPLASTY Right 2010   Dr Para March  . TOTAL KNEE ARTHROPLASTY  10/30/2011   Procedure: TOTAL KNEE ARTHROPLASTY;  Surgeon: Lorn Junes, MD;  Location: [facility];  Service: Orthopedics;  Laterality: Left;     . XRT     chemo, surgery     reports that she quit smoking about 37 years ago. She has never used smokeless tobacco. She reports that she drank alcohol. She reports that she does not use drugs.  Allergies  Allergen Reactions  . Cefuroxime Diarrhea  . Celecoxib Nausea Only  . Deltasone [Prednisone] Swelling    Oral deltasone is causing swelling (can use Depo-Medrol)  . Ranitidine Other (See Comments)    bloating  . Spironolactone     High K  . Tapazole [Methimazole]  Rash  . Tramadol Hcl Nausea Only  . Doxycycline Nausea Only    Family History  Problem Relation Age of Onset  . Dementia Mother   . Heart disease Mother   . Mental retardation Mother   . Hypertension Mother   . Alzheimer's disease Mother   . Heart attack Father   . Alcohol abuse Father   . Prostate cancer Brother   . Diabetes Other   . Colon cancer Neg Hx   . Anesthesia problems Neg Hx      Prior to Admission medications   Medication Sig Start Date End Date Taking? Authorizing Provider  acetaminophen (TYLENOL) 650 MG CR tablet Take 650 mg by mouth every 8 (eight) hours as needed for pain.    [provider]  ALPRAZolam Duanne Moron) 0.5 MG  tablet Take 1 tablet (0.5 mg total) by mouth 2 (two) times daily as needed for anxiety. Patient taking differently: Take 0.5 mg by mouth 2 (two) times daily as needed for anxiety or sleep.  02/12/18   Plotnikov, Evie Lacks, MD  apixaban (ELIQUIS) 2.5 MG TABS tablet Take 1 tablet (2.5 mg total) by mouth 2 (two) times daily. 03/12/18   Bensimhon, Shaune Pascal, MD  carvedilol (COREG) 3.125 MG tablet Take 1 tablet (3.125 mg total) by mouth 2 (two) times daily with a meal. 02/11/18   Josue Hector, MD  coal tar (NEUTROGENA T-GEL) 0.5 % shampoo Apply topically 2 (two) times a week.     [provider]  HYDROcodone-acetaminophen (NORCO) 5-325 MG tablet Take 1 tablet by mouth every 6 (six) hours as needed for moderate pain or severe pain. 06/11/18   Fanny Skates, MD  ketoconazole (NIZORAL) 2 % shampoo Apply 1 application topically 2 (two) times a week.    [provider]  torsemide (DEMADEX) 20 MG tablet TAKE TWO TABLETS BY MOUTH DAILY Patient taking differently: Take 40 mg by mouth daily.  04/30/18   Plotnikov, Evie Lacks, MD  triamcinolone ointment (KENALOG) 0.1 % Apply 1 application topically 2 (two) times daily. Use qd -bid Patient taking differently: Apply 1 application topically daily as needed (irritation). Use qd -bid 05/06/18   Plotnikov, Evie Lacks, MD  vitamin C (ASCORBIC ACID) 500 MG tablet Take 1 tablet (500 mg total) by mouth daily. 03/14/16   Plotnikov, Evie Lacks, MD    Physical Exam: Vitals:   06/18/18 2030 06/18/18 2320 06/18/18 2321 06/18/18 2323  BP: (!) 138/99 (!) 134/95  (!) 134/95  Pulse: 83  73 60  Resp: 16  (!) 9 18  Temp:    97.6 F (36.4 C)  TempSrc:    Oral  SpO2: 93%  92% 92%    Constitutional: NAD, calm, comfortable Eyes: PERRL, lids and conjunctivae normal ENMT: Mucous membranes are moist. Posterior pharynx clear of any exudate or lesions.Normal dentition.  Neck: normal, supple, no masses, no thyromegaly Respiratory: clear to auscultation bilaterally, no  wheezing, no crackles. Normal respiratory effort. No accessory muscle use.  Cardiovascular: Regular rate and rhythm, no murmurs / rubs / gallops. No extremity edema. 2+ pedal pulses. No carotid bruits.  Abdomen: no tenderness, no masses palpated. No hepatosplenomegaly. Bowel sounds positive.  Musculoskeletal: no clubbing / cyanosis. No joint deformity upper and lower extremities. Good ROM, no contractures. Normal muscle tone.  Skin: no rashes, lesions, ulcers. No induration Neurologic: CN 2-12 grossly intact. Sensation intact, DTR normal. Strength 5/5 in all 4.  Psychiatric: Normal judgment and insight. Alert and oriented x 3. Normal mood.  Labs on Admission: I have personally reviewed following labs and imaging studies  CBC: Recent Labs  Lab 06/18/18 1905  WBC 7.1  HGB 13.3  HCT 41.9  MCV 97.4  PLT 989   Basic Metabolic Panel: Recent Labs  Lab 06/17/18 1544 06/18/18 1905  NA 141 139  K 4.3 4.1  CL 96 98  CO2 37* 31  GLUCOSE 100* 113*  BUN 47* 32*  CREATININE 1.62* 1.30*  CALCIUM 9.6 9.3   GFR: Estimated Creatinine Clearance: 25.7 mL/min (A) (by C-G formula based on SCr of 1.3 mg/dL (H)). Liver Function Tests: Recent Labs  Lab 06/17/18 1544  AST 33  ALT 27  ALKPHOS 79  BILITOT 1.4*  PROT 6.6  ALBUMIN 4.3   No results for input(s): LIPASE, AMYLASE in the last 168 hours. No results for input(s): AMMONIA in the last 168 hours. Coagulation Profile: No results for input(s): INR, PROTIME in the last 168 hours. Cardiac Enzymes: No results for input(s): CKTOTAL, CKMB, CKMBINDEX, TROPONINI in the last 168 hours. BNP (last 3 results) Recent Labs    06/17/18 1544  PROBNP 1,573.0*   HbA1C: No results for input(s): HGBA1C in the last 72 hours. CBG: No results for input(s): GLUCAP in the last 168 hours. Lipid Profile: No results for input(s): CHOL, HDL, LDLCALC, TRIG, CHOLHDL, LDLDIRECT in the last 72 hours. Thyroid Function Tests: No results for input(s): TSH,  T4TOTAL, FREET4, T3FREE, THYROIDAB in the last 72 hours. Anemia Panel: No results for input(s): VITAMINB12, FOLATE, FERRITIN, TIBC, IRON, RETICCTPCT in the last 72 hours. Urine analysis:    Component Value Date/Time   COLORURINE YELLOW 05/06/2018 1440   APPEARANCEUR CLEAR 05/06/2018 1440   LABSPEC 1.010 05/06/2018 1440   PHURINE 7.0 05/06/2018 1440   GLUCOSEU NEGATIVE 05/06/2018 1440   HGBUR NEGATIVE 05/06/2018 1440   BILIRUBINUR NEGATIVE 05/06/2018 1440   KETONESUR NEGATIVE 05/06/2018 1440   PROTEINUR NEGATIVE 01/29/2016 1615   UROBILINOGEN 0.2 05/06/2018 1440   NITRITE NEGATIVE 05/06/2018 1440   LEUKOCYTESUR NEGATIVE 05/06/2018 1440    Radiological Exams on Admission: Dg Chest 2 View  Result Date: 06/18/2018 CLINICAL DATA:  Bilateral lower extremity swelling for 3 days. CHF. Recent left breast lumpectomy. EXAM: CHEST - 2 VIEW COMPARISON:  06/17/2018 FINDINGS: The cardiac silhouette remains enlarged. Aortic atherosclerosis and a large hiatal hernia are again noted. There is mild chronic interstitial coarsening which is likely related to patient's history of COPD. No airspace consolidation, overt pulmonary edema, pleural effusion, or pneumothorax is identified. Surgical clips project over the left breast and axilla. Degenerative changes are noted at the shoulders, and there is severe lumbar levoscoliosis. IMPRESSION: 1. No active cardiopulmonary disease. 2. Cardiomegaly, COPD, and large hiatal hernia. Electronically Signed   By: Logan Bores M.D.   On: 06/18/2018 20:17   Dg Chest 2 View  Result Date: 06/17/2018 CLINICAL DATA:  Leg swelling, increased shortness of breath EXAM: CHEST - 2 VIEW COMPARISON:  06/06/2016 FINDINGS: There is bilateral mild interstitial thickening. There are trace bilateral pleural effusions. There is no focal consolidation. There is no pneumothorax. There is stable cardiomegaly. There is thoracic aortic atherosclerosis. There is a large hiatal hernia. The osseous  structures are unremarkable. IMPRESSION: Cardiomegaly with mild pulmonary vascular congestion. Electronically Signed   By: Kathreen Devoid   On: 06/17/2018 16:23    EKG: Independently reviewed.  Assessment/Plan Principal Problem:   Acute on chronic combined systolic and diastolic heart failure [facility]) Active Problems:   Essential hypertension   Atrial fibrillation (HCC)  CKD (chronic kidney disease) stage 4, GFR 15-29 ml/min (HCC)   Malignant neoplasm of upper-inner quadrant of left breast in female, estrogen receptor positive ([place])    1. Acute on chronic combined CHF - 1. Complicated by [place] 2. CHF pathway 3. 2d echo 4. Lasix '80mg'$  IV daily 5. Daily BMP 6. No ACEi due to CKD 2. HTN - continue home BP meds 3. A.Fib - 1. Continue coreg 2. Continue Eliquis 4. CKD stage 3-4 - 1. Chronic and stable, creat 1.3 2. Monitor with diuresis 5. BRCA, ER+  1. Appointment with oncology early Nov to start "anti hormone therapy" (presumably something like tamoxifen).  DVT prophylaxis: Eliquis Code Status: DNR - confirmed by pt Family Communication: Family at bedside Disposition Plan: Home after admit Consults called: None Admission status: Place in [place], [facility] Pager [phone] Only works nights!  If 7AM-7PM, please contact the primary day team physician taking care of patient  [email or web address] Password [email or web address]  06/19/2018, 12:47 AM

## 2018-06-19 NOTE — Progress Notes (Signed)
  PROGRESS NOTE  Mia Contreras DPO:[id number] DOB: 1928-11-29 DOA: 06/18/2018 PCP: Cassandria Anger, MD  Brief Narrative: 84 year old woman PMH chronic systolic CHF, pulmonary hypertension, atrial fibrillation, Status post left breast lumpectomy 10/22, presented with fluid gain and shortness of breath, sent to the emergency department on advice of patient's cardiologist.  Admitted for acute CHF.  Assessment/Plan Acute on chronic bi-systolic CHF, severe TR, nonischemic cardiomyopathy, pulmonary hypertension --Excellent diuresis, -2.4 L since admission. --Making improvement but still has significant lower extremity edema  Permanent atrial fibrillation, rate controlled. --Continue apixaban, carvedilol  CKD stage III --Appears to be at baseline.  Status post left breast lumpectomy 10/22 for recurrent left breast cancer.   Improving but still size significant lower extremity edema bilaterally.  Continue IV Lasix today, reevaluate in the morning.  Likely discharge 10/31 if continues to improve.  DVT prophylaxis: apixaban Code Status: partial Family Communication: none Disposition Plan: home    Murray Hodgkins, MD  [facility] Direct contact: [phone] --Via amion app OR  --[email or web address]; password [email or web address]  7PM-7AM contact night coverage as above 06/19/2018, 11:19 AM  LOS: 0 days   Consultants:    Procedures:  Echo   Antimicrobials:    Interval history/Subjective: Feels better today, breathing better, still has LE edema.  Objective: Vitals:  Vitals:   06/19/18 0416 06/19/18 0856  BP: 116/76 118/85  Pulse: 85 73  Resp: 18   Temp: 97.6 F (36.4 C)   SpO2: 90%     Exam:  Constitutional:  . Appears calm and comfortable Respiratory:  . CTA bilaterally, no w/r/r.  . Respiratory effort normal. No retractions or accessory muscle use Cardiovascular:  . RRR, no m/r/g . 2+ BLE extremity edema ankle, pre-tibia, R>L . Telemetry SR with PACs, PVCs    Musculoskeletal:  . RUE, LUE, RLE, LLE   o strength and tone normal Psychiatric:  . Mental status o Mood, affect appropriate   I have personally reviewed the following:   Data: . Creatinine appears to be at baseline, 1.30, consistent with CKD stage III.  BMP on admission unremarkable.  BNP on admission 1407, troponin negative.  CBC unremarkable. . EKG independently reviewed showed atrial fibrillation old inferior MI, incomplete bundle branch block morphology, no significant change compared to prior June 2017  Scheduled Meds: . Derrill Memo ON 06/20/2018] apixaban  2.5 mg Oral BID  . carvedilol  3.125 mg Oral BID WC  . furosemide  80 mg Intravenous Daily  . sodium chloride flush  3 mL Intravenous Q12H  . triamcinolone ointment  1 application Topical Daily   Continuous Infusions: . sodium chloride      Principal Problem:   Acute on chronic systolic CHF (congestive heart failure) (HCC) Active Problems:   Essential hypertension   Atrial fibrillation (HCC)   Malignant neoplasm of upper-inner quadrant of left breast in female, estrogen receptor positive (HCC)   Congestive heart failure with right ventricular systolic dysfunction (HCC)   Severe tricuspid regurgitation   LOS: 0 days

## 2018-06-20 ENCOUNTER — Ambulatory Visit: Payer: Medicare Other | Admitting: Cardiovascular Disease

## 2018-06-20 DIAGNOSIS — I480 Paroxysmal atrial fibrillation: Secondary | ICD-10-CM

## 2018-06-20 DIAGNOSIS — I5020 Unspecified systolic (congestive) heart failure: Secondary | ICD-10-CM

## 2018-06-20 LAB — BASIC METABOLIC PANEL
Anion gap: 9 (ref 5–15)
BUN: 20 mg/dL (ref 8–23)
CHLORIDE: 98 mmol/L (ref 98–111)
CO2: 33 mmol/L — AB (ref 22–32)
CREATININE: 1.32 mg/dL — AB (ref 0.44–1.00)
Calcium: 9.1 mg/dL (ref 8.9–10.3)
GFR calc non Af Amer: 35 mL/min — ABNORMAL LOW (ref >60)
GFR, EST AFRICAN AMERICAN: 41 mL/min — AB (ref >60)
Glucose, Bld: 90 mg/dL (ref 70–99)
Potassium: 3.1 mmol/L — ABNORMAL LOW (ref 3.5–5.1)
SODIUM: 140 mmol/L (ref 135–145)

## 2018-06-20 MED ORDER — POTASSIUM CHLORIDE CRYS ER 20 MEQ PO TBCR
40.0000 meq | EXTENDED_RELEASE_TABLET | ORAL | Status: AC
  Administered 2018-06-20 (×2): 40 meq via ORAL
  Filled 2018-06-20 (×2): qty 2

## 2018-06-20 NOTE — Plan of Care (Signed)
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
## 2018-06-20 NOTE — Discharge Summary (Signed)
Physician Discharge Summary  Mia Contreras WSF:[id number] DOB: 06/15/1929 DOA: 06/18/2018  PCP: Cassandria Anger, MD  Admit date: 06/18/2018 Discharge date: 06/20/2018  Recommendations for Outpatient Follow-up:  1. Follow-up acute on chronic diastolic CHF with decreased LVEF   Follow-up Information    Daune Perch, NP Follow up on 06/24/2018.   Specialty:  Nurse Practitioner Why:  Please arrive 15 minutes early for your 11am post-hospital cardiology appointment with a nurse practitioner who works with Dr. Johnsie Cancel.  Contact information: [place] [address] [phone]            Discharge Diagnoses:  1. Acute on chronic bi-systolic CHF, severe TR, nonischemic cardiomyopathy, pulmonary hypertension (principal) 2. Permanent atrial fibrillation, rate controlled. 3. CKD stage III 4. Status post left breast lumpectomy 10/22 for recurrent left breast cancer.  Discharge Condition: improved Disposition: home  Diet recommendation: heart healthy  Filed Weights   06/19/18 0123 06/20/18 0449  Weight: 56.4 kg 53 kg    History of present illness:  84 year old woman PMH chronic systolic CHF, pulmonary hypertension, atrial fibrillation, Status post left breast lumpectomy 10/22, presented with fluid gain and shortness of breath, sent to the emergency department on advice of patient's cardiologist.  Admitted for acute CHF.  Hospital Course:  Patient responded well with excellent diuresis, hospitalization was uncomplicated.  Echocardiogram revealed a significant decrease in LVEF, patient was seen by cardiology with recommendation for outpatient follow-up, no change in medications.  Follow-up with cardiology has been arranged as an outpatient.  Individual issues as below.  Acute on chronic bi-systolic CHF, severe TR, nonischemic cardiomyopathy, pulmonary hypertension --Excellent diuresis, -3.8 L since admission. --Lower extremity edema has resolved.   Patient appears euvolemic at this time.  Permanent atrial fibrillation, rate controlled. --Stable.  Continue apixaban, carvedilol  CKD stage III --At baseline  Status post left breast lumpectomy 10/22 for recurrent left breast cancer.  Procedures:  Echo  Study Conclusions  - Left ventricle: The cavity size was normal. Wall thickness was   normal. Systolic function was severely reduced. The estimated   ejection fraction was in the range of 20% to 25%. Dyskinesis of   the anteroseptal myocardium. - Mitral valve: There was mild regurgitation. - Left atrium: The atrium was mildly dilated. - Right ventricle: The cavity size was severely dilated. Systolic   function was moderately reduced. - Right atrium: The atrium was severely dilated. - Tricuspid valve: There was moderate-severe regurgitation. - Pulmonary arteries: Systolic pressure was moderately increased.   PA peak pressure: 58 mm Hg (S).  Today's assessment: S: feels well, breathing well, swelling gone. Wants to go home. O: Vitals:  Vitals:   06/20/18 0937 06/20/18 1616  BP: 100/71 109/64  Pulse: 82 (!) 52  Resp:  16  Temp:  98 F (36.7 C)  SpO2:  91%    Constitutional:  . Appears calm and comfortable Respiratory:  . CTA bilaterally, no w/r/r.  . Respiratory effort normal.  Psychiatric:  . Mental status o Mood, affect appropriate  Potassium 3.1.  Creatinine stable 1.32.  Discharge Instructions  Discharge Instructions    (HEART FAILURE PATIENTS) Call MD:  Anytime you have any of the following symptoms: 1) 3 pound weight gain in 24 hours or 5 pounds in 1 week 2) shortness of breath, with or without a dry hacking cough 3) swelling in the hands, feet or stomach 4) if you have to sleep on extra pillows at night in order to breathe.   Complete  by:  As directed    Diet - low sodium heart healthy   Complete by:  As directed    Discharge instructions   Complete by:  As directed    Call your physician or seek  immediate medical attention for weight gain, swelling, shortness of breath or worsening of conditino.   Heart Failure patients record your daily weight using the same scale at the same time of day   Complete by:  As directed    Increase activity slowly   Complete by:  As directed      Allergies as of 06/20/2018      Reactions   Cefuroxime Diarrhea   Celecoxib Nausea Only   Deltasone [prednisone] Swelling   Oral deltasone is causing swelling (can use Depo-Medrol)   Ranitidine Other (See Comments)   bloating   Spironolactone Other (See Comments)   High K   Tapazole [methimazole] Rash   Tramadol Hcl Nausea Only   Doxycycline Nausea Only      Medication List    TAKE these medications   acetaminophen 650 MG CR tablet Commonly known as:  TYLENOL Take 650 mg by mouth every 8 (eight) hours as needed for pain.   ALPRAZolam 0.5 MG tablet Commonly known as:  XANAX Take 1 tablet (0.5 mg total) by mouth 2 (two) times daily as needed for anxiety. What changed:    when to take this  additional instructions   apixaban 2.5 MG Tabs tablet Commonly known as:  ELIQUIS Take 1 tablet (2.5 mg total) by mouth 2 (two) times daily.   carvedilol 3.125 MG tablet Commonly known as:  COREG Take 1 tablet (3.125 mg total) by mouth 2 (two) times daily with a meal.   coal tar 0.5 % shampoo Commonly known as:  NEUTROGENA T-GEL Apply topically 2 (two) times a week.   HYDROcodone-acetaminophen 5-325 MG tablet Commonly known as:  NORCO/VICODIN Take 1 tablet by mouth every 6 (six) hours as needed for moderate pain or severe pain.   ketoconazole 2 % shampoo Commonly known as:  NIZORAL Apply 1 application topically 2 (two) times a week.   SYSTANE ULTRA PF OP Place 2 drops into both eyes as needed (for dry eyes).   torsemide 20 MG tablet Commonly known as:  DEMADEX TAKE TWO TABLETS BY MOUTH DAILY   triamcinolone ointment 0.1 % Commonly known as:  KENALOG Apply 1 application topically 2  (two) times daily. Use qd -bid What changed:    when to take this  additional instructions   vitamin C 500 MG tablet Commonly known as:  ASCORBIC ACID Take 1 tablet (500 mg total) by mouth daily.      Allergies  Allergen Reactions  . Cefuroxime Diarrhea  . Celecoxib Nausea Only  . Deltasone [Prednisone] Swelling    Oral deltasone is causing swelling (can use Depo-Medrol)  . Ranitidine Other (See Comments)    bloating  . Spironolactone Other (See Comments)    High K  . Tapazole [Methimazole] Rash  . Tramadol Hcl Nausea Only  . Doxycycline Nausea Only    The results of significant diagnostics from this hospitalization (including imaging, microbiology, ancillary and laboratory) are listed below for reference.    Significant Diagnostic Studies: Dg Chest 2 View  Result Date: 06/18/2018 CLINICAL DATA:  Bilateral lower extremity swelling for 3 days. CHF. Recent left breast lumpectomy. EXAM: CHEST - 2 VIEW COMPARISON:  06/17/2018 FINDINGS: The cardiac silhouette remains enlarged. Aortic atherosclerosis and a large hiatal hernia are again noted.  There is mild chronic interstitial coarsening which is likely related to patient's history of COPD. No airspace consolidation, overt pulmonary edema, pleural effusion, or pneumothorax is identified. Surgical clips project over the left breast and axilla. Degenerative changes are noted at the shoulders, and there is severe lumbar levoscoliosis. IMPRESSION: 1. No active cardiopulmonary disease. 2. Cardiomegaly, COPD, and large hiatal hernia. Electronically Signed   By: Logan Bores M.D.   On: 06/18/2018 20:17   Dg Chest 2 View  Result Date: 06/17/2018 CLINICAL DATA:  Leg swelling, increased shortness of breath EXAM: CHEST - 2 VIEW COMPARISON:  06/06/2016 FINDINGS: There is bilateral mild interstitial thickening. There are trace bilateral pleural effusions. There is no focal consolidation. There is no pneumothorax. There is stable cardiomegaly.  There is thoracic aortic atherosclerosis. There is a large hiatal hernia. The osseous structures are unremarkable. IMPRESSION: Cardiomegaly with mild pulmonary vascular congestion. Electronically Signed   By: Kathreen Devoid   On: 06/17/2018 16:23    Labs: Basic Metabolic Panel: Recent Labs  Lab 06/17/18 1544 06/18/18 1905 06/20/18 0630  NA 141 139 140  K 4.3 4.1 3.1*  CL 96 98 98  CO2 37* 31 33*  GLUCOSE 100* 113* 90  BUN 47* 32* 20  CREATININE 1.62* 1.30* 1.32*  CALCIUM 9.6 9.3 9.1   Liver Function Tests: Recent Labs  Lab 06/17/18 1544  AST 33  ALT 27  ALKPHOS 79  BILITOT 1.4*  PROT 6.6  ALBUMIN 4.3   CBC: Recent Labs  Lab 06/18/18 1905  WBC 7.1  HGB 13.3  HCT 41.9  MCV 97.4  PLT 197    Recent Labs    06/18/18 1905  BNP 1,407.4*     Recent Labs    06/17/18 1544  PROBNP 1,573.0*    Principal Problem:   Acute on chronic systolic CHF (congestive heart failure) (HCC) Active Problems:   Essential hypertension   Atrial fibrillation (HCC)   Malignant neoplasm of upper-inner quadrant of left breast in female, estrogen receptor positive (HCC)   Congestive heart failure with right ventricular systolic dysfunction (HCC)   Severe tricuspid regurgitation   CHF (congestive heart failure) ([place])   Time coordinating discharge: 35 minutes   Signed:  Murray Hodgkins, MD [facility] 06/20/2018, 5:02 PM

## 2018-06-21 ENCOUNTER — Telehealth: Payer: Self-pay | Admitting: *Deleted

## 2018-06-21 NOTE — Telephone Encounter (Signed)
Pt was on TCM report admitted 06/18/18 for acute CHF. Patient responded well with excellent diuresis, hospitalization was uncomplicated.  Echocardiogram revealed a significant decrease in LVEF, patient was seen by cardiology with recommendation for outpatient follow-up, no change in medications. Pt D/C 06/20/18 will f/u w/cardiology on 11/4...Mia Contreras

## 2018-06-24 ENCOUNTER — Encounter: Payer: Self-pay | Admitting: Endocrinology

## 2018-06-24 ENCOUNTER — Encounter: Payer: Self-pay | Admitting: Cardiology

## 2018-06-24 ENCOUNTER — Ambulatory Visit (INDEPENDENT_AMBULATORY_CARE_PROVIDER_SITE_OTHER): Payer: Medicare Other | Admitting: Cardiology

## 2018-06-24 ENCOUNTER — Ambulatory Visit (INDEPENDENT_AMBULATORY_CARE_PROVIDER_SITE_OTHER): Payer: Medicare Other | Admitting: Endocrinology

## 2018-06-24 VITALS — BP 114/70 | HR 59 | Ht 63.0 in | Wt 119.4 lb

## 2018-06-24 VITALS — BP 130/70 | HR 78 | Ht 63.0 in | Wt 118.0 lb

## 2018-06-24 DIAGNOSIS — L97911 Non-pressure chronic ulcer of unspecified part of right lower leg limited to breakdown of skin: Secondary | ICD-10-CM

## 2018-06-24 DIAGNOSIS — N183 Chronic kidney disease, stage 3 unspecified: Secondary | ICD-10-CM

## 2018-06-24 DIAGNOSIS — I10 Essential (primary) hypertension: Secondary | ICD-10-CM

## 2018-06-24 DIAGNOSIS — I5022 Chronic systolic (congestive) heart failure: Secondary | ICD-10-CM | POA: Diagnosis not present

## 2018-06-24 DIAGNOSIS — I4821 Permanent atrial fibrillation: Secondary | ICD-10-CM

## 2018-06-24 DIAGNOSIS — E042 Nontoxic multinodular goiter: Secondary | ICD-10-CM

## 2018-06-24 LAB — BASIC METABOLIC PANEL
BUN/Creatinine Ratio: 16 (ref 12–28)
BUN: 22 mg/dL (ref 8–27)
CALCIUM: 9.7 mg/dL (ref 8.7–10.3)
CHLORIDE: 94 mmol/L — AB (ref 96–106)
CO2: 29 mmol/L (ref 20–29)
Creatinine, Ser: 1.39 mg/dL — ABNORMAL HIGH (ref 0.57–1.00)
GFR calc Af Amer: 40 mL/min/{1.73_m2} — ABNORMAL LOW (ref >59)
GFR calc non Af Amer: 34 mL/min/{1.73_m2} — ABNORMAL LOW (ref >59)
GLUCOSE: 82 mg/dL (ref 65–99)
Potassium: 4.1 mmol/L (ref 3.5–5.2)
Sodium: 142 mmol/L (ref 134–144)

## 2018-06-24 NOTE — Progress Notes (Signed)
Subjective:    Patient ID: Mia Contreras, female    DOB: 1928/12/18, 84 y.o.   MRN: [id number]  HPI Pt returns for f/u of hyperthyroidism (due to large multinodular goiter; dx'ed 2008; bx then showed HYPERPLASTICNODULE. in 2011, pt had RAI for hyperthyroidism; she became euthyroid, last [name] in early 2015 was unchanged; in 2017, hyperthyroidism recurred; plan was for repeat RAI, but RAI uptake was low; pt says she does not take any iodine-containing products, so she was rx'ed tapazole; this was changed to PTU, due to a rash; the rash was since determined to be psoriasis; she stopped PTU in 1/19, due to ongoing rash, and has been off rx since then).  pt states she feels well in general.    Past Medical History:  Diagnosis Date  . AKI (acute kidney injury) ([place]) 01/2016  . ANEMIA-NOS 08/20/2007  . ANXIETY 03/16/2007  . BREAST CANCER, HX OF 03/16/2007  . CARPAL TUNNEL SYNDROME, RIGHT 01/11/2008  . Cataract    floaters  . CELLULITIS, LEG, RIGHT 08/20/2007  . CHF (congestive heart failure) ([place])   . Collagenous colitis   . Congestive heart failure with right ventricular systolic dysfunction ([place]) 06/19/2018  . COPD (chronic obstructive pulmonary disease) ([place])   . DIVERTICULOSIS, COLON 08/20/2007  . Dyspnea    on exertion  . Dysrhythmia    atrial fib  . GOITER, MULTINODULAR 05/06/2010   Dr [name]  . HYPERLIPIDEMIA 08/20/2007  . HYPERTENSION 03/16/2007  . HYPERTHYROIDISM 05/23/2010  . IBS (irritable bowel syndrome)   . OSTEOARTHRITIS 03/16/2007  . OSTEOPOROSIS 08/20/2007  . Pneumonia 07/2011   took abx for several weeks  . PREMATURE ATRIAL CONTRACTIONS 09/21/2010  . SCOLIOSIS 01/11/2008  . Severe tricuspid regurgitation 06/19/2018  . SKIN CANCER, HX OF 11/12/2008    R cheek 2010, L Cheek 2011 Dr. Tonia Brooms  . Thyroid nodule   . VENOUS INSUFFICIENCY 09/05/2007    Past Surgical History:  Procedure Laterality Date  . ABDOMINAL HYSTERECTOMY    . APPENDECTOMY  1972  . BREAST LUMPECTOMY Left  2001  . BREAST LUMPECTOMY WITH RADIOACTIVE SEED LOCALIZATION Left 06/11/2018   Procedure: LEFT BREAST LUMPECTOMY WITH BRACKETED RADIOACTIVE SEED LOCALIZATION;  Surgeon: Fanny Skates, MD;  Location: [facility];  Service: General;  Laterality: Left;  . CARPAL TUNNEL RELEASE    . CATARACT EXTRACTION    . EYE SURGERY     cataract ext  . HERNIA REPAIR    . MASTECTOMY, PARTIAL Left   . SKIN CANCER EXCISION     face-Dr. Bing Plume  . TOTAL KNEE ARTHROPLASTY    . TOTAL KNEE ARTHROPLASTY Right 2010   Dr Para March  . TOTAL KNEE ARTHROPLASTY  10/30/2011   Procedure: TOTAL KNEE ARTHROPLASTY;  Surgeon: Lorn Junes, MD;  Location: [facility];  Service: Orthopedics;  Laterality: Left;     . XRT     chemo, surgery    Social History   Socioeconomic History  . Marital status: Single    Spouse name: Not on file  . Number of children: Not on file  . Years of education: Not on file  . Highest education level: Not on file  Occupational History  . Not on file  Social Needs  . Financial resource strain: Not on file  . Food insecurity:    Worry: Not on file    Inability: Not on file  . Transportation needs:    Medical: Not on file    Non-medical: Not on file  Tobacco Use  .  Smoking status: Former Smoker    Last attempt to quit: 12/20/1980    Years since quitting: 37.5  . Smokeless tobacco: Never Used  Substance and Sexual Activity  . Alcohol use: Not Currently  . Drug use: No  . Sexual activity: Not Currently  Lifestyle  . Physical activity:    Days per week: Not on file    Minutes per session: Not on file  . Stress: Not on file  Relationships  . Social connections:    Talks on phone: Not on file    Gets together: Not on file    Attends religious service: Not on file    Active member of club or organization: Not on file    Attends meetings of clubs or organizations: Not on file    Relationship status: Not on file  . Intimate partner violence:    Fear of current or ex partner: Not on file     Emotionally abused: Not on file    Physically abused: Not on file    Forced sexual activity: Not on file  Other Topics Concern  . Not on file  Social History Narrative  . Not on file    Current Outpatient Medications on File Prior to Visit  Medication Sig Dispense Refill  . acetaminophen (TYLENOL) 650 MG CR tablet Take 650 mg by mouth every 8 (eight) hours as needed for pain.    Marland Kitchen ALPRAZolam (XANAX) 0.5 MG tablet Take 1 tablet (0.5 mg total) by mouth 2 (two) times daily as needed for anxiety. 60 tablet 3  . apixaban (ELIQUIS) 2.5 MG TABS tablet Take 1 tablet (2.5 mg total) by mouth 2 (two) times daily. 60 tablet 11  . carvedilol (COREG) 3.125 MG tablet Take 1 tablet (3.125 mg total) by mouth 2 (two) times daily with a meal. 60 tablet 11  . coal tar (NEUTROGENA T-GEL) 0.5 % shampoo Apply topically 2 (two) times a week.     Marland Kitchen ketoconazole (NIZORAL) 2 % shampoo Apply 1 application topically 2 (two) times a week.    Vladimir Faster Glycol-Propyl Glycol (SYSTANE ULTRA PF OP) Place 2 drops into both eyes as needed (for dry eyes).    . torsemide (DEMADEX) 20 MG tablet TAKE TWO TABLETS BY MOUTH DAILY 180 tablet 5  . triamcinolone ointment (KENALOG) 0.1 % Apply 1 application topically 2 (two) times daily. Use qd -bid 80 g 2  . vitamin C (ASCORBIC ACID) 500 MG tablet Take 1 tablet (500 mg total) by mouth daily. 100 tablet 3   No current facility-administered medications on file prior to visit.     Allergies  Allergen Reactions  . Cefuroxime Diarrhea  . Celecoxib Nausea Only  . Deltasone [Prednisone] Swelling    Oral deltasone is causing swelling (can use Depo-Medrol)  . Ranitidine Other (See Comments)    bloating  . Spironolactone Other (See Comments)    High K  . Tapazole [Methimazole] Rash  . Tramadol Hcl Nausea Only  . Doxycycline Nausea Only    Family History  Problem Relation Age of Onset  . Dementia Mother   . Heart disease Mother   . Mental retardation Mother   . Hypertension  Mother   . Alzheimer's disease Mother   . Heart attack Father   . Alcohol abuse Father   . Prostate cancer Brother   . Diabetes Other   . Colon cancer Neg Hx   . Anesthesia problems Neg Hx     BP 130/70 (BP Location: Right Arm, Patient  Position: Sitting, Cuff Size: Normal)   Pulse 78   Ht 5\' 3"  (1.6 m)   Wt 118 lb (53.5 kg)   SpO2 90%   BMI 20.90 kg/m    Review of Systems Denies fever.      Objective:   Physical Exam VITAL SIGNS:  See vs page.   GENERAL: no distress.  Neck: thyroid is 3-5 times normal size (L>>R) is again noted, with irreg surface.     Lab Results  Component Value Date   TSH 1.82 05/06/2018      Assessment & Plan:  Multinodular goiter, clinically stable Hyperthyroidism: in remission.  Patient Instructions  No medication is needed for the thyroid now.  However, the overactive thyroid will come back with time.   Please come back for a follow-up appointment in 4-6 months.

## 2018-06-24 NOTE — Consult Note (Signed)
            [facility] CM Primary Care Navigator  06/24/2018  Eveny Anastas Goold [birth year] [id number]   Attempt to seepatient at the bedside to identify possible discharge needs butshewas already dischargedhomeper staff.  Per MD note,patient was directed by cardiology to the ED and presented with fluid gain and shortness of breath, responded well with excellent diuresis and hospitalization was uncomplicated . (acute on chronicbi-systolic congestive HF- significant decrease in LVEF, severe TR- tricuspid regurgitation,nonischemic cardiomyopathy,pulmonary hypertension)  Patient has discharge instruction to follow-up withcardiology on 06/24/18.  Primary care provider's office is listed as providing transition of care (TOC) follow-up.    For additional questions please contact:  Edwena Felty A. Cordelia Bessinger, BSN, RN-BC [facility] PRIMARY CARE Navigator Cell: [phone]

## 2018-06-24 NOTE — Patient Instructions (Signed)
No medication is needed for the thyroid now.  However, the overactive thyroid will come back with time.   Please come back for a follow-up appointment in 4-6 months.

## 2018-06-24 NOTE — Progress Notes (Signed)
Cardiology Office Note:    Date:  06/24/2018   ID:  [name], DOB 1928-10-30, MRN [id number]  PCP:  Cassandria Anger, MD  Cardiologist:  Jenkins Rouge, MD  Referring MD: Cassandria Anger, MD   Chief Complaint  Patient presents with  . Hospitalization Follow-up    CHF    History of Present Illness:    Mia Contreras is a 84 y.o. female with a past medical history significant for non-ischemic cardiomyopathy with EF 45-50% on last echo 2017 (previously 30-35% in 2016),  hypertension, hyperlipidemia, CKD stage III, breast cancer and persistent atrial fibrillation on a apixaban.   He was admitted to the hospital 06/18/2018 for evaluation of weight gain and lower extremity edema despite extra torsemide at home.  She had had a recurrence of stage I breast cancer and underwent lumpectomy 06/11/2018 and following her surgery people have been bringing food over to her home.  She may have been eating more salt than she usually does.  BNP was 1407 and chest x-ray showed mild vascular congestion.  Echo on 06/19/2018 showed EF down to 20-25% with dyskinesis of the anteroseptal myocardium.  She had no chest pain and does have renal dysfunction so she was not felt to be a candidate for cardiac catheterization to evaluate her reduced EF.  She was diuresed with IV Lasix with improvement, weight loss of about 10 pounds and discharged home on her usual torsemide.  She is also not a candidate for the addition of ACE inhibitor, ARB or ARNI.  Blood pressure was low in the 03T-465 systolic, therefore she would not be able to tolerate further afterload reduction.  The patient thinks that the IV NS fluids given with surgery caused her volume overload.   She is feeling much better since her hospitalization. Her home wt has been stable within 1.5 lbs. Breathing is at her baseline and she has no LE edema.   Has a right lower leg skin ulcer that has been present for several weeks. Being treated by PCP.  She hasd ABIs attempted in 2017 for non-healing ulcer with no significant abnormalities noted.  Past Medical History:  Diagnosis Date  . AKI (acute kidney injury) ([place]) 01/2016  . ANEMIA-NOS 08/20/2007  . ANXIETY 03/16/2007  . BREAST CANCER, HX OF 03/16/2007  . CARPAL TUNNEL SYNDROME, RIGHT 01/11/2008  . Cataract    floaters  . CELLULITIS, LEG, RIGHT 08/20/2007  . CHF (congestive heart failure) ([place])   . Collagenous colitis   . Congestive heart failure with right ventricular systolic dysfunction ([place]) 06/19/2018  . COPD (chronic obstructive pulmonary disease) ([place])   . DIVERTICULOSIS, COLON 08/20/2007  . Dyspnea    on exertion  . Dysrhythmia    atrial fib  . GOITER, MULTINODULAR 05/06/2010   Dr [name]  . HYPERLIPIDEMIA 08/20/2007  . HYPERTENSION 03/16/2007  . HYPERTHYROIDISM 05/23/2010  . IBS (irritable bowel syndrome)   . OSTEOARTHRITIS 03/16/2007  . OSTEOPOROSIS 08/20/2007  . Pneumonia 07/2011   took abx for several weeks  . PREMATURE ATRIAL CONTRACTIONS 09/21/2010  . SCOLIOSIS 01/11/2008  . Severe tricuspid regurgitation 06/19/2018  . SKIN CANCER, HX OF 11/12/2008    R cheek 2010, L Cheek 2011 Dr. Tonia Brooms  . Thyroid nodule   . VENOUS INSUFFICIENCY 09/05/2007    Past Surgical History:  Procedure Laterality Date  . ABDOMINAL HYSTERECTOMY    . APPENDECTOMY  1972  . BREAST LUMPECTOMY Left 2001  . BREAST LUMPECTOMY WITH RADIOACTIVE SEED LOCALIZATION Left 06/11/2018  Procedure: LEFT BREAST LUMPECTOMY WITH BRACKETED RADIOACTIVE SEED LOCALIZATION;  Surgeon: Fanny Skates, MD;  Location: [facility];  Service: General;  Laterality: Left;  . CARPAL TUNNEL RELEASE    . CATARACT EXTRACTION    . EYE SURGERY     cataract ext  . HERNIA REPAIR    . MASTECTOMY, PARTIAL Left   . SKIN CANCER EXCISION     face-Dr. Bing Plume  . TOTAL KNEE ARTHROPLASTY    . TOTAL KNEE ARTHROPLASTY Right 2010   Dr Para March  . TOTAL KNEE ARTHROPLASTY  10/30/2011   Procedure: TOTAL KNEE ARTHROPLASTY;  Surgeon: Lorn Junes, MD;  Location: [facility];  Service: Orthopedics;  Laterality: Left;     . XRT     chemo, surgery    Current Medications: Current Meds  Medication Sig  . acetaminophen (TYLENOL) 650 MG CR tablet Take 650 mg by mouth every 8 (eight) hours as needed for pain.  Marland Kitchen ALPRAZolam (XANAX) 0.5 MG tablet Take 1 tablet (0.5 mg total) by mouth 2 (two) times daily as needed for anxiety.  Marland Kitchen apixaban (ELIQUIS) 2.5 MG TABS tablet Take 1 tablet (2.5 mg total) by mouth 2 (two) times daily.  . carvedilol (COREG) 3.125 MG tablet Take 1 tablet (3.125 mg total) by mouth 2 (two) times daily with a meal.  . coal tar (NEUTROGENA T-GEL) 0.5 % shampoo Apply topically 2 (two) times a week.   Marland Kitchen ketoconazole (NIZORAL) 2 % shampoo Apply 1 application topically 2 (two) times a week.  Vladimir Faster Glycol-Propyl Glycol (SYSTANE ULTRA PF OP) Place 2 drops into both eyes as needed (for dry eyes).  . torsemide (DEMADEX) 20 MG tablet TAKE TWO TABLETS BY MOUTH DAILY  . triamcinolone ointment (KENALOG) 0.1 % Apply 1 application topically 2 (two) times daily. Use qd -bid  . vitamin C (ASCORBIC ACID) 500 MG tablet Take 1 tablet (500 mg total) by mouth daily.  . [DISCONTINUED] HYDROcodone-acetaminophen (NORCO) 5-325 MG tablet Take 1 tablet by mouth every 6 (six) hours as needed for moderate pain or severe pain.     Allergies:   Cefuroxime; Celecoxib; Deltasone [prednisone]; Ranitidine; Spironolactone; Tapazole [methimazole]; Tramadol hcl; and Doxycycline   Social History   Socioeconomic History  . Marital status: Single    Spouse name: Not on file  . Number of children: Not on file  . Years of education: Not on file  . Highest education level: Not on file  Occupational History  . Not on file  Social Needs  . Financial resource strain: Not on file  . Food insecurity:    Worry: Not on file    Inability: Not on file  . Transportation needs:    Medical: Not on file    Non-medical: Not on file  Tobacco Use  . Smoking  status: Former Smoker    Last attempt to quit: 12/20/1980    Years since quitting: 37.5  . Smokeless tobacco: Never Used  Substance and Sexual Activity  . Alcohol use: Not Currently  . Drug use: No  . Sexual activity: Not Currently  Lifestyle  . Physical activity:    Days per week: Not on file    Minutes per session: Not on file  . Stress: Not on file  Relationships  . Social connections:    Talks on phone: Not on file    Gets together: Not on file    Attends religious service: Not on file    Active member of club or organization: Not on file  Attends meetings of clubs or organizations: Not on file    Relationship status: Not on file  Other Topics Concern  . Not on file  Social History Narrative  . Not on file     Family History: The patient's family history includes Alcohol abuse in her father; Alzheimer's disease in her mother; Dementia in her mother; Diabetes in her other; Heart attack in her father; Heart disease in her mother; Hypertension in her mother; Mental retardation in her mother; Prostate cancer in her brother. There is no history of Colon cancer or Anesthesia problems. ROS:   Please see the history of present illness.     All other systems reviewed and are negative.  EKGs/Labs/Other Studies Reviewed:    The following studies were reviewed today:  Echocardiogram 06/19/18: Study Conclusions - Left ventricle: The cavity size was normal. Wall thickness was normal. Systolic function was severely reduced. The estimated ejection fraction was in the range of 20% to 25%. Dyskinesis of the anteroseptal myocardium. - Mitral valve: There was mild regurgitation. - Left atrium: The atrium was mildly dilated. - Right ventricle: The cavity size was severely dilated. Systolic function was moderately reduced. - Right atrium: The atrium was severely dilated. - Tricuspid valve: There was moderate-severe regurgitation. - Pulmonary arteries: Systolic pressure was  moderately increased. PA peak pressure: 58 mm Hg (S).  EKG:  EKG is  ordered today.    Recent Labs: 05/06/2018: TSH 1.82 06/17/2018: ALT 27; Pro B Natriuretic peptide (BNP) 1,573.0 06/18/2018: B Natriuretic Peptide 1,407.4; Hemoglobin 13.3; Platelets 197 06/20/2018: BUN 20; Creatinine, Ser 1.32; Potassium 3.1; Sodium 140   Recent Lipid Panel    Component Value Date/Time   CHOL 130 11/04/2015 1016   TRIG 77.0 11/04/2015 1016   TRIG 72 08/01/2006 0841   HDL 54.50 11/04/2015 1016   CHOLHDL 2 11/04/2015 1016   VLDL 15.4 11/04/2015 1016   LDLCALC 60 11/04/2015 1016    Physical Exam:    VS:  BP 114/70   Pulse (!) 59   Ht 5\' 3"  (1.6 m)   Wt 119 lb 6.4 oz (54.2 kg)   SpO2 91%   BMI 21.15 kg/m     Wt Readings from Last 3 Encounters:  06/24/18 119 lb 6.4 oz (54.2 kg)  06/20/18 116 lb 12.8 oz (53 kg)  06/17/18 129 lb (58.5 kg)     Physical Exam  Constitutional: She is oriented to person, place, and time. She appears well-developed and well-nourished. No distress.  HENT:  Head: Normocephalic and atraumatic.  Neck: Normal range of motion. Neck supple. No JVD present.  Cardiovascular: Normal rate, regular rhythm and normal heart sounds. Exam reveals no gallop and no friction rub.  No murmur heard. Pulmonary/Chest: Effort normal and breath sounds normal. No respiratory distress. She has no wheezes. She has no rales.  Abdominal: Soft. Bowel sounds are normal.  Musculoskeletal: Normal range of motion. She exhibits no edema.  Neurological: She is alert and oriented to person, place, and time.  Skin: Skin is warm and dry.  Psychiatric: She has a normal mood and affect. Her behavior is normal. Judgment and thought content normal.  Vitals reviewed.   ASSESSMENT:    1. Chronic systolic heart failure ([place])   2. Permanent atrial fibrillation   3. Chronic renal failure, stage 3 (moderate) ([facility])   4. Essential hypertension    PLAN:    In order of problems listed above:  Acute  on chronic CHF -Recent hospitalization for increased weight and lower extremity  edema after recent breast surgery.  Echocardiogram showed a new decrease in LVEF down to 20 to 25% with anteroseptal dyskinesis and biventricular failure, complicated by severe chronic kidney disease.  She is not a candidate for the addition of ACE inhibitor, ARB or ARNI.  She has treated with carvedilol and torsemide.  Low blood pressure inhibited addition of any further afterload reduction.  -Heart rate today is 59 so unable to increase carvedilol. -The patient is back to normal with stable weight and no edema, breathing at baseline. -Continue current therapy  Permanent atrial fibrillation -On Eliquis for stroke risk reduction -Rate controlled on carvedilol -Continue current treatment  CKD stage 3 -Serum creatinine was up to 1.62 in the hospital, down to 1.32 on day of discharge.  Potassium was 3.1 on day of discharge -We will check metabolic panel today  Hypertension -Blood pressure was soft in the hospital, limiting titration of carvedilol -Blood pressure well controlled today-Continue carvedilol and torsemide  Right leg wound limited to breakdown of the skin -Followed by her PCP and wound care.  Does not appear infected.  Felt to be related to venous insufficiency.  Prior ABIs in 2017 with no significant abnormalities, but study was limited. -She does not want to have any circulation studies at this time as she would not want a procedure to improve circulation at this time. Advised pt to discuss with her wound care provider.   Medication Adjustments/Labs and Tests Ordered: Current medicines are reviewed at length with the patient today.  Concerns regarding medicines are outlined above. Labs and tests ordered and medication changes are outlined in the patient instructions below:  Patient Instructions  Medication Instructions:  Your physician recommends that you continue on your current medications as  directed. Please refer to the Current Medication list given to you today.  If you need a refill on your cardiac medications before your next appointment, please call your pharmacy.   Lab work: TODAY:  BMET  If you have labs (blood work) drawn today and your tests are completely normal, you will receive your results only by: Marland Kitchen MyChart Message (if you have MyChart) OR . A paper copy in the mail If you have any lab test that is abnormal or we need to change your treatment, we will call you to review the results.  Testing/Procedures: None ordered  Follow-Up: At [facility], you and your health needs are our priority.  As part of our continuing mission to provide you with exceptional heart care, we have created designated Provider Care Teams.  These Care Teams include your primary Cardiologist (physician) and Advanced Practice Providers (APPs -  Physician Assistants and Nurse Practitioners) who all work together to provide you with the care you need, when you need it. You will need a follow up appointment in [place] Rouge, [place], in 3 MONTHS.    Signed, Daune Perch, NP  06/24/2018 11:46 AM    [place]

## 2018-06-24 NOTE — Patient Instructions (Signed)
Medication Instructions:  Your physician recommends that you continue on your current medications as directed. Please refer to the Current Medication list given to you today.  If you need a refill on your cardiac medications before your next appointment, please call your pharmacy.   Lab work: TODAY:  BMET  If you have labs (blood work) drawn today and your tests are completely normal, you will receive your results only by: Marland Kitchen MyChart Message (if you have MyChart) OR . A paper copy in the mail If you have any lab test that is abnormal or we need to change your treatment, we will call you to review the results.  Testing/Procedures: None ordered  Follow-Up: At [facility], you and your health needs are our priority.  As part of our continuing mission to provide you with exceptional heart care, we have created designated Provider Care Teams.  These Care Teams include your primary Cardiologist (physician) and Advanced Practice Providers (APPs -  Physician Assistants and Nurse Practitioners) who all work together to provide you with the care you need, when you need it. You will need a follow up appointment in [place], [place], in 3 MONTHS.

## 2018-06-30 NOTE — Progress Notes (Signed)
Mia Contreras   [phone] Fax:([phone]) [phone]   Clinic Follow Up Note   Patient Care Team: Plotnikov, Evie Lacks, MD as PCP - General Josue Hector, MD as PCP - Cardiology (Cardiology) Josue Hector, MD as Attending Physician (Cardiology) Renato Shin, MD as Consulting Physician (Endocrinology) Rolm Bookbinder, MD as Consulting Physician (Dermatology) Truitt Merle, MD as Consulting Physician (Hematology) Fanny Skates, MD as Consulting Physician (General Surgery) Gery Pray, MD as Consulting Physician (Radiation Oncology) 07/01/2018  CHIEF COMPLAINTS:  F/u on  left breast cancer  Oncology History   Cancer Staging Malignant neoplasm of upper-inner quadrant of left breast in female, estrogen receptor positive ([place]) Staging form: Breast, AJCC 8th Edition - Clinical stage from 05/08/2018: Stage IA (cT1c, cN0, cM0, G1, ER+, PR+, HER2-) - Signed by Truitt Merle, MD on 05/15/2018       Malignant neoplasm of upper-inner quadrant of left breast in female, estrogen receptor positive ([place])   04/30/2018 Mammogram    diagnostic mammogram on 04/30/2018 at [facility] that revealed 1.2 CM left breast mass.    04/30/2018 Breast US    Breast US revealed a 1.4 CM round solid mass that is suspicious of malignancy.    05/08/2018 Cancer Staging    Staging form: Breast, AJCC 8th Edition - Clinical stage from 05/08/2018: Stage IA (cT1c, cN0, cM0, G1, ER+, PR+, HER2-) - Signed by Truitt Merle, MD on 05/15/2018    05/08/2018 Receptors her2    ADDITIONAL INFORMATION: PROGNOSTIC INDICATORS Results: IMMUNOHISTOCHEMICAL AND MORPHOMETRIC ANALYSIS PERFORMED MANUALLY The tumor cells are Negative for Her2 (1+). Estrogen Receptor: 95%, POSITIVE, STRONG STAINING INTENSITY Progesterone Receptor: 90%, POSITIVE, STRONG STAINING INTENSITY Proliferation Marker Ki67: 10%    05/08/2018 Pathology Results    Diagnosis Breast, left, needle core biopsy, mass at 10 o'clock, 5 cm fn - INVASIVE DUCTAL  CARCINOMA. - DUCTAL CARCINOMA IN SITU. - SEE COMMENT.    05/10/2018 Initial Diagnosis    Malignant neoplasm of upper-inner quadrant of left breast in female, estrogen receptor positive ([place])    06/11/2018 Pathology Results     06/11/2018 Surgical Pathology Diagnosis Breast, lumpectomy, left with radioactive seed bracketed - INVASIVE AND IN SITU DUCTAL CARCINOMA, 1.5 CM. - MARGINS NOT INVOLVED. - SEE ONCOLOGY TABLE.    06/11/2018 Surgery    Left breast lumpectomy with Dr. Dalbert Batman on 06/11/2018.       HISTORY OF PRESENTING ILLNESS:  Mia Contreras 84 y.o. female is here because of newly diagnosed left breast cancer. She initially presented with a palpable lump that was felt July 2019. She has personal history of left breast cancer that was treated with surgery, chemo, and radiation in 2000. She has a diagnostic mammogram on 04/30/2018 at [facility] that revealed 1.2 CM left breast mass. Breast US revealed a 1.4 CM round solid mass that is suspicious of malignancy. Pathology from the left breast mass confirmed invasive and in situ ductal carcinoma.  Today, she is here alone at the clinic. She stated that the previous cancer was stage 2 because of the size. She saw Dr. [name] for her previous cancer. She said that radiation affected her heart. She states that she bruises easily.   She follows up with cardiology and endocrinology for her chronic conditions including CHF, A-fib, and hyperthyroidism.   She also has OA, scoliosis, and psoriasis. She has gait difficulties due to scoliosis and she states that she had multiple PT sessions. She can walk independently, but uses walker sometimes.   Her bother is in  [place], [place]. Brother also has CAD and afib, Mother had CABG but did well, Father passed away from MI at age 33 thought to be due to heavy ETOH use. Brother had bladder and prostate cancer and brother's son died from non-hugkin lymphoma.  Pt has a distant history of smoking. Pt has 50-60 pack  years up to 2ppd. Quit in 1982.   She lives alone in a town house, but has friends from church. She is capable of carrying out daily activities independently.   GYN HISTORY  GP: No children Menarche: 84-13 yo LMP: Unknown Hormonal Therapy: Yes, for 6 months. Stopped in 1972 after complete hysterectomy    CURRENT THERAPY Surveillance   INTERVAL HISTORY Mia Contreras is a 84 y.o. female who is here for follow-up. She is noted to have had left breast lumpectomy with Dr. Dalbert Batman on 06/11/2018.  Today, she is here with her friend. She was recently hospitalized for CHF exacerbation. She is recovering well from her recent hospitalization. She also is recovering well from her breast surgery.    MEDICAL HISTORY:  Past Medical History:  Diagnosis Date  . AKI (acute kidney injury) ([place]) 01/2016  . ANEMIA-NOS 08/20/2007  . ANXIETY 03/16/2007  . BREAST CANCER, HX OF 03/16/2007  . CARPAL TUNNEL SYNDROME, RIGHT 01/11/2008  . Cataract    floaters  . CELLULITIS, LEG, RIGHT 08/20/2007  . CHF (congestive heart failure) ([place])   . Collagenous colitis   . Congestive heart failure with right ventricular systolic dysfunction ([place]) 06/19/2018  . COPD (chronic obstructive pulmonary disease) ([place])   . DIVERTICULOSIS, COLON 08/20/2007  . Dyspnea    on exertion  . Dysrhythmia    atrial fib  . Family history of brain tumor   . Family history of lung cancer   . Family history of lymphoma   . Family history of prostate cancer   . Family history of skin cancer   . GOITER, MULTINODULAR 05/06/2010   Dr [name]  . HYPERLIPIDEMIA 08/20/2007  . HYPERTENSION 03/16/2007  . HYPERTHYROIDISM 05/23/2010  . IBS (irritable bowel syndrome)   . OSTEOARTHRITIS 03/16/2007  . OSTEOPOROSIS 08/20/2007  . Personal history of breast cancer 07/01/2018  . Pneumonia 07/2011   took abx for several weeks  . PREMATURE ATRIAL CONTRACTIONS 09/21/2010  . SCOLIOSIS 01/11/2008  . Severe tricuspid regurgitation 06/19/2018  . SKIN  CANCER, HX OF 11/12/2008    R cheek 2010, L Cheek 2011 Dr. Tonia Brooms  . Thyroid nodule   . Uterine cancer ([place])   . VENOUS INSUFFICIENCY 09/05/2007    SURGICAL HISTORY: Past Surgical History:  Procedure Laterality Date  . ABDOMINAL HYSTERECTOMY    . APPENDECTOMY  1972  . BREAST LUMPECTOMY Left 2001  . BREAST LUMPECTOMY WITH RADIOACTIVE SEED LOCALIZATION Left 06/11/2018   Procedure: LEFT BREAST LUMPECTOMY WITH BRACKETED RADIOACTIVE SEED LOCALIZATION;  Surgeon: Fanny Skates, MD;  Location: [facility];  Service: General;  Laterality: Left;  . CARPAL TUNNEL RELEASE    . CATARACT EXTRACTION    . EYE SURGERY     cataract ext  . HERNIA REPAIR    . MASTECTOMY, PARTIAL Left   . SKIN CANCER EXCISION     face-Dr. Bing Plume  . TOTAL KNEE ARTHROPLASTY    . TOTAL KNEE ARTHROPLASTY Right 2010   Dr Para March  . TOTAL KNEE ARTHROPLASTY  10/30/2011   Procedure: TOTAL KNEE ARTHROPLASTY;  Surgeon: Lorn Junes, MD;  Location: [facility];  Service: Orthopedics;  Laterality: Left;     . XRT  chemo, surgery    SOCIAL HISTORY: Social History   Socioeconomic History  . Marital status: Single    Spouse name: Not on file  . Number of children: Not on file  . Years of education: Not on file  . Highest education level: Not on file  Occupational History  . Not on file  Social Needs  . Financial resource strain: Not on file  . Food insecurity:    Worry: Not on file    Inability: Not on file  . Transportation needs:    Medical: Not on file    Non-medical: Not on file  Tobacco Use  . Smoking status: Former Smoker    Last attempt to quit: 12/20/1980    Years since quitting: 37.5  . Smokeless tobacco: Never Used  Substance and Sexual Activity  . Alcohol use: Not Currently  . Drug use: No  . Sexual activity: Not Currently  Lifestyle  . Physical activity:    Days per week: Not on file    Minutes per session: Not on file  . Stress: Not on file  Relationships  . Social connections:    Talks on phone:  Not on file    Gets together: Not on file    Attends religious service: Not on file    Active member of club or organization: Not on file    Attends meetings of clubs or organizations: Not on file    Relationship status: Not on file  . Intimate partner violence:    Fear of current or ex partner: Not on file    Emotionally abused: Not on file    Physically abused: Not on file    Forced sexual activity: Not on file  Other Topics Concern  . Not on file  Social History Narrative  . Not on file    FAMILY HISTORY: Family History  Problem Relation Age of Onset  . Dementia Mother   . Heart disease Mother   . Mental retardation Mother   . Hypertension Mother   . Alzheimer's disease Mother   . Heart attack Father   . Alcohol abuse Father   . Prostate cancer Brother        dx >50  . Bladder Cancer Brother        dx >50  . Diabetes Other   . Brain cancer Maternal Aunt 70  . Brain cancer Maternal Uncle 40  . Lung cancer Paternal Uncle   . Heart attack Maternal Grandmother   . Heart attack Maternal Grandfather   . Lymphoma Other 38       NH  . Skin cancer Other   . Colon cancer Neg Hx   . Anesthesia problems Neg Hx     ALLERGIES:  is allergic to cefuroxime; celecoxib; deltasone [prednisone]; ranitidine; spironolactone; tapazole [methimazole]; tramadol hcl; and doxycycline.  MEDICATIONS:  Current Outpatient Medications  Medication Sig Dispense Refill  . acetaminophen (TYLENOL) 650 MG CR tablet Take 650 mg by mouth every 8 (eight) hours as needed for pain.    Marland Kitchen ALPRAZolam (XANAX) 0.5 MG tablet Take 1 tablet (0.5 mg total) by mouth 2 (two) times daily as needed for anxiety. 60 tablet 3  . apixaban (ELIQUIS) 2.5 MG TABS tablet Take 1 tablet (2.5 mg total) by mouth 2 (two) times daily. 60 tablet 11  . carvedilol (COREG) 3.125 MG tablet Take 1 tablet (3.125 mg total) by mouth 2 (two) times daily with a meal. 60 tablet 11  . coal tar (NEUTROGENA T-GEL) 0.5 %  shampoo Apply topically 2  (two) times a week.     Marland Kitchen ketoconazole (NIZORAL) 2 % shampoo Apply 1 application topically 2 (two) times a week.    Vladimir Faster Glycol-Propyl Glycol (SYSTANE ULTRA PF OP) Place 2 drops into both eyes as needed (for dry eyes).    . torsemide (DEMADEX) 20 MG tablet TAKE TWO TABLETS BY MOUTH DAILY 180 tablet 5  . triamcinolone ointment (KENALOG) 0.1 % Apply 1 application topically 2 (two) times daily. Use qd -bid 80 g 2  . vitamin C (ASCORBIC ACID) 500 MG tablet Take 1 tablet (500 mg total) by mouth daily. 100 tablet 3   No current facility-administered medications for this visit.     REVIEW OF SYSTEMS:   Constitutional: Denies fevers, chills or abnormal night sweats Eyes: Denies blurriness of vision, double vision or watery eyes Ears, nose, mouth, throat, and face: Denies mucositis or sore throat Respiratory: Denies cough, dyspnea or wheezes Cardiovascular: Denies palpitation, chest discomfort or lower extremity swelling Gastrointestinal:  Denies nausea, heartburn or change in bowel habits Skin: Denies abnormal skin rashes (+) bruising from recent hospitalization (+) psoriasis Lymphatics: Denies new lymphadenopathy or easy bruising Neurological:Denies numbness, tingling or new weaknesses MSK: (+) OA and scoliosis (+) instable gait due to scoliosis  Behavioral/Psych: Mood is stable, no new changes  Breast: (+) palpable mass on left breast All other systems were reviewed with the patient and are negative.  PHYSICAL EXAMINATION:  ECOG PERFORMANCE STATUS: 1 - Symptomatic but completely ambulatory  Vitals:   07/01/18 1335  BP: 123/63  Pulse: 71  Resp: 17  Temp: 97.6 F (36.4 C)  SpO2: 92%   Filed Weights   07/01/18 1335  Weight: 120 lb 3.2 oz (54.5 kg)    GENERAL:alert, no distress and comfortable (+) on wheelchair SKIN: skin color, texture, turgor are normal, no rashes or significant lesions (+) bruise on right arm (+) thin fragile skin EYES: normal, conjunctiva are pink and  non-injected, sclera clear OROPHARYNX:no exudate, no erythema and lips, buccal mucosa, and tongue normal  NECK: (+) mobile goiter, non-tender, without nodularity, stable LYMPH:  no palpable lymphadenopathy in the cervical, axillary or inguinal LUNGS: clear to auscultation and percussion with normal breathing effort HEART: regular rate & rhythm and no murmurs and no lower extremity edema ABDOMEN:abdomen soft, non-tender and normal bowel sounds Musculoskeletal:no cyanosis of digits and no clubbing (+) waddling gait due to scoliosis  PSYCH: alert & oriented x 3 with fluent speech NEURO: no focal motor/sensory deficits Breast: (+) s/p left breast lumpectomy, incision at UQ healing well with no skin erythema or discharge   LABORATORY DATA:  I have reviewed the data as listed CBC Latest Ref Rng & Units 06/18/2018 06/05/2018 05/15/2018  WBC 4.0 - 10.5 K/uL 7.1 6.4 5.3  Hemoglobin 12.0 - 15.0 g/dL 13.3 13.5 13.1  Hematocrit 36.0 - 46.0 % 41.9 43.5 40.5  Platelets 150 - 400 K/uL 197 205 177    CMP Latest Ref Rng & Units 06/24/2018 06/20/2018 06/18/2018  Glucose 65 - 99 mg/dL 82 90 113(H)  BUN 8 - 27 mg/dL 22 20 32(H)  Creatinine 0.57 - 1.00 mg/dL 1.39(H) 1.32(H) 1.30(H)  Sodium 134 - 144 mmol/L 142 140 139  Potassium 3.5 - 5.2 mmol/L 4.1 3.1(L) 4.1  Chloride 96 - 106 mmol/L 94(L) 98 98  CO2 20 - 29 mmol/L 29 33(H) 31  Calcium 8.7 - 10.3 mg/dL 9.7 9.1 9.3  Total Protein 6.0 - 8.3 g/dL - - -  Total Bilirubin  0.2 - 1.2 mg/dL - - -  Alkaline Phos 39 - 117 U/L - - -  AST 0 - 37 U/L - - -  ALT 0 - 35 U/L - - -    PATHOLOGY:   06/11/2018 Surgical Pathology Diagnosis Breast, lumpectomy, left with radioactive seed bracketed - INVASIVE AND IN SITU DUCTAL CARCINOMA, 1.5 CM. - MARGINS NOT INVOLVED. - SEE ONCOLOGY TABLE. Microscopic Comment INVASIVE CARCINOMA OF THE BREAST: Resection Procedure: Localization lumpectomy. Specimen Laterality: Left breast. Tumor Size: 1.5 x 1.2 x 1.0  cm. Histologic Type: Ductal. Histologic Grade: II. Glandular (Acinar)/Tubular Differentiation: 3. Nuclear Pleomorphism: 2. Mitotic Rate: 2. Overall Grade: 7. Ductal Carcinoma In Situ: Present. Tumor Extension: N/A. (required only if the structures are present and involved) (select all that apply) Margins: Free of tumor. Distance from closest margin (millimeters): 5 mm from posterior and anterior margins. Specify closest margin (required only if <41m): Posterior and anterior. DCIS Margins: (required only if DCIS is present in specimen) Distance from closest margin (millimeters): 5 mm. Specify closest margin (required only if <172m: Posterior and anterior. Regional Lymph Nodes: Number of Lymph Nodes Examined: 0. Number of Sentinel Nodes Examined (if applicable): 0. Number of Lymph Nodes with Macrometastases (>2 mm): N/A. Number of Lymph Nodes with Micrometastases: N/A. Number of Lymph Nodes with Isolated Tumor Cells (?0.2 mm or ?200 cells)#: N/A. Size of Largest Metastatic Deposit (millimeters): N/A. Extranodal Extension: N/A. Treatment Effect: No known presurgical therapy. Breast Biomarker Testing Performed on Previous Biopsy: Yes. Testing Performed on Case Number: [id number] Receptor: 95%, positive, strong staining. Progesterone Receptor: 90%, positive, strong staining. HER2: Negative. Ki-67: 10%. Representative tumor block: 1A, 1B, and 1C. Pathologic Stage Classification (pTNM, AJCC 8th Edition): pT1c, pNX. (v4.2.0.0)rmation Specimen(s) Obtained: Breast, lumpectomy, left with radioactive seed bracketed Specimen Clinical Information recurrent left breast cancer (cm) Gross Specimen type: Left breast seed localization lumpectomy, received fresh. The specimen is placed in formalin at 10:40 a.m. on 06/11/18. Size: 7.2 cm at the lateral-medial axis, 7.0 cm at the superior-inferior axis and 2.7 cm at the anterior-posterior axis. There is a 6.2 x 2.5 cm portion of skin  attached along the anterior surface. Orientation: The specimen is oriented with previously applied inks (anteiror - green, inferior - blue, lateral - orange, medial - yellow, posterior - black, superior - red). Localized area: There are pins inserted in the specimen identifying the biopsy clip and two localization seeds . The two seeds are identified at the time of receipt. Cut surface: At the localized area there is a 1.5 x 1.2 x 1.0 cm firm tan red nodule which has ill defined borders. The remainder of the breast tissue consists of soft yellow adipose tissue and white fibrous tissue. Margins: The nodule is located 0.5 cm from the posterior margin and 0.5 cm from the anterior margin. The remaining margins measure greater than 1 cm. Prognostic indicators: Obtained from paraffin blocks if needed. Block summary: Six blocks submitted. A-D = nodule to include the anterior and posterior margin, some with skin E = inferior and medial margins F = superior and inferior margins. (GRP:kh 06/11/18)    05/08/2018 Surgical Pathology  ADDITIONAL INFORMATION: PROGNOSTIC INDICATORS Results: IMMUNOHISTOCHEMICAL AND MORPHOMETRIC ANALYSIS PERFORMED MANUALLY The tumor cells are Negative for Her2 (1+). Estrogen Receptor: 95%, POSITIVE, STRONG STAINING INTENSITY Progesterone Receptor: 90%, POSITIVE, STRONG STAINING INTENSITY Proliferation Marker Ki67: 10% REFERENCE RANGE ESTROGEN RECEPTOR NEGATIVE 0% POSITIVE =>1% REFERENCE RANGE PROGESTERONE RECEPTOR NEGATIVE 0% POSITIVE =>1% All controls stained appropriately Diagnosis Breast, left, needle core  biopsy, mass at 10 o'clock, 5 cm fn - INVASIVE DUCTAL CARCINOMA. - DUCTAL CARCINOMA IN SITU. - SEE COMMENT.  Microscopic Comment The carcinoma appears grade I. A breast prognostic profile will be performed and the results reported separately. The results were called to [facility] on 05/09/18. (JBK:kh 05-09-18) Specimen Gross and Clinical  Information Specimen Comment 84 year old white female w/o remote left breast CA with new palp mass at 10:00 k cm fn Specimen(s) Obtained: Breast, left, needle core biopsy, mass at 10 o'clock, 5 cm fn Gross Received in formalin labeled with the patient's name and "Left", are 2 core(s) of tan-yellow, fibroadipose tissue, ranging from 1.4 x 0.2 x 0.2 cm to 1.7 x 0.2 x 0.2 cm. The specimen is entirely submitted in 1 cassette(s). Time in formalin 11:58 am on 05/08/18. Cold ischemic time less than 1 min. Craig Staggers 05/08/18 ) Stain(s) used in Diagnosis: The following stain(s) were used in diagnosing the case: ER-ACIS, PR-ACIS, Her2 by IHC, KI-67-ACIS. The control(s) stained appropriately.  RADIOGRAPHIC STUDIES: I have personally reviewed the radiological images as listed and agreed with the findings in the report.  04/30/2018 Breast US   04/30/2018 Mammogram   Dg Chest 2 View  Result Date: 06/18/2018 CLINICAL DATA:  Bilateral lower extremity swelling for 3 days. CHF. Recent left breast lumpectomy. EXAM: CHEST - 2 VIEW COMPARISON:  06/17/2018 FINDINGS: The cardiac silhouette remains enlarged. Aortic atherosclerosis and a large hiatal hernia are again noted. There is mild chronic interstitial coarsening which is likely related to patient's history of COPD. No airspace consolidation, overt pulmonary edema, pleural effusion, or pneumothorax is identified. Surgical clips project over the left breast and axilla. Degenerative changes are noted at the shoulders, and there is severe lumbar levoscoliosis. IMPRESSION: 1. No active cardiopulmonary disease. 2. Cardiomegaly, COPD, and large hiatal hernia. Electronically Signed   By: Logan Bores M.D.   On: 06/18/2018 20:17   Dg Chest 2 View  Result Date: 06/17/2018 CLINICAL DATA:  Leg swelling, increased shortness of breath EXAM: CHEST - 2 VIEW COMPARISON:  06/06/2016 FINDINGS: There is bilateral mild interstitial thickening. There are trace bilateral pleural  effusions. There is no focal consolidation. There is no pneumothorax. There is stable cardiomegaly. There is thoracic aortic atherosclerosis. There is a large hiatal hernia. The osseous structures are unremarkable. IMPRESSION: Cardiomegaly with mild pulmonary vascular congestion. Electronically Signed   By: Kathreen Devoid   On: 06/17/2018 16:23    ASSESSMENT & PLAN:  Mia Contreras is a 84 y.o. lovely female with a history of anxiety, CHF, A fib, L Breast cancer in 2000 with surgery, hyperthyroidism, CKD, HTN, and HLD  1. Malignant neoplasm of upper-inner quadrant of left breast, invasive ductal carcinoma and DCIS. ER 95%, PR 90%, Ki67 10% -She underwent lumpectomy, with clear surgical margins.  I discussed her surgical pathology findings with patient. -Because of her previous radiation treatment on the same breast, radiation is not offered -Due to her advanced age and medical comorbidities, she is not a candidate for chemotherapy, Oncotype was not obtained. -Given the small size tumor, clinical negative node status, she likely has risk of recurrence. ---Giving her strongly ER and PR positivity of the tumor cells, I recommend adjuvant endocrine therapy with tamoxifen or AI.  Due to her osteoporosis and severe osteoarthritis, she probably will tolerate tamoxifen better.  The potential side effects, which includes but not limited to, hot flash, skin and vaginal dryness, slightly increased risk of cardiovascular disease and cataract, small risk of  thrombosis, were discussed with her in great details. - -She has extensive history of heart disease, recent echo showed EF 20 to 25%, she was hospitalized for heart failure. -Due to the potential side effects of cardiovascular disease, patient declined tamoxifen, which is understandable and reasonable.  -We discussed breast cancer surveillance.  She will continue annual mammogram, routine follow-up with [name] with breast exam, and self-exam. -I previously discussed  genetic testing with her. She was not interested.  -She is healing well from surgery, I will see her back in 6 months.  2. CHF, Afib, HTN, HLD -She f/u with Dr. Johnsie Cancel -Persistent atrial fibrillation stable rate control on low dose coreg. Patient has intermittent palpitation only when under a lot of stress or anxiety.  This relieves with taking Xanax.  I have advised to take extra Coreg if palpitation persist.  If severe symptomatic call EMS.  Continue Eliquis for anticoagulation.  No bleeding issues no eliquis. -Chronic systolic heart failure:  euvolemic functional class one. Continue beta-blocker and torsemide at current dose.  3. Hyperthyroidism -She f/u with Dr. [name]  4. CKD -stable, continue f/u with PCP     PLAN -adjuvant antiestrogen therapy discussed and pt declined -f/u in 6 months with labs  -she will see Dr. Dalbert Batman in 3 months   No orders of the defined types were placed in this encounter.   All questions were answered. The patient knows to call the clinic with any problems, questions or concerns. I spent 25 minutes counseling the patient face to face. The total time spent in the appointment was 30 minutes and more than 50% was on counseling.    Truitt Merle, MD 07/01/2018   Dierdre Searles Dweik am acting as scribe for Dr. Truitt Merle.  I have reviewed the above documentation for accuracy and completeness, and I agree with the above.

## 2018-07-01 ENCOUNTER — Encounter: Payer: Self-pay | Admitting: Genetics

## 2018-07-01 ENCOUNTER — Inpatient Hospital Stay (HOSPITAL_BASED_OUTPATIENT_CLINIC_OR_DEPARTMENT_OTHER): Payer: Medicare Other | Admitting: Genetics

## 2018-07-01 ENCOUNTER — Inpatient Hospital Stay: Payer: Medicare Other | Attending: Hematology | Admitting: Hematology

## 2018-07-01 ENCOUNTER — Encounter: Payer: Self-pay | Admitting: Hematology

## 2018-07-01 ENCOUNTER — Inpatient Hospital Stay: Payer: Medicare Other

## 2018-07-01 VITALS — BP 123/63 | HR 71 | Temp 97.6°F | Resp 17 | Ht 63.0 in | Wt 120.2 lb

## 2018-07-01 DIAGNOSIS — N189 Chronic kidney disease, unspecified: Secondary | ICD-10-CM | POA: Insufficient documentation

## 2018-07-01 DIAGNOSIS — E0590 Thyrotoxicosis, unspecified without thyrotoxic crisis or storm: Secondary | ICD-10-CM | POA: Insufficient documentation

## 2018-07-01 DIAGNOSIS — C50212 Malignant neoplasm of upper-inner quadrant of left female breast: Secondary | ICD-10-CM | POA: Diagnosis not present

## 2018-07-01 DIAGNOSIS — Z85828 Personal history of other malignant neoplasm of skin: Secondary | ICD-10-CM | POA: Diagnosis not present

## 2018-07-01 DIAGNOSIS — Z8042 Family history of malignant neoplasm of prostate: Secondary | ICD-10-CM | POA: Insufficient documentation

## 2018-07-01 DIAGNOSIS — Z807 Family history of other malignant neoplasms of lymphoid, hematopoietic and related tissues: Secondary | ICD-10-CM | POA: Diagnosis not present

## 2018-07-01 DIAGNOSIS — Z853 Personal history of malignant neoplasm of breast: Secondary | ICD-10-CM

## 2018-07-01 DIAGNOSIS — I4891 Unspecified atrial fibrillation: Secondary | ICD-10-CM | POA: Diagnosis not present

## 2018-07-01 DIAGNOSIS — Z801 Family history of malignant neoplasm of trachea, bronchus and lung: Secondary | ICD-10-CM | POA: Insufficient documentation

## 2018-07-01 DIAGNOSIS — Z7901 Long term (current) use of anticoagulants: Secondary | ICD-10-CM | POA: Insufficient documentation

## 2018-07-01 DIAGNOSIS — Z808 Family history of malignant neoplasm of other organs or systems: Secondary | ICD-10-CM | POA: Insufficient documentation

## 2018-07-01 DIAGNOSIS — I509 Heart failure, unspecified: Secondary | ICD-10-CM | POA: Diagnosis not present

## 2018-07-01 DIAGNOSIS — Z79899 Other long term (current) drug therapy: Secondary | ICD-10-CM | POA: Diagnosis not present

## 2018-07-01 DIAGNOSIS — Z8052 Family history of malignant neoplasm of bladder: Secondary | ICD-10-CM

## 2018-07-01 DIAGNOSIS — Z170 Estrogen receptor positive status [ER+]: Secondary | ICD-10-CM | POA: Insufficient documentation

## 2018-07-01 DIAGNOSIS — I130 Hypertensive heart and chronic kidney disease with heart failure and stage 1 through stage 4 chronic kidney disease, or unspecified chronic kidney disease: Secondary | ICD-10-CM | POA: Insufficient documentation

## 2018-07-01 DIAGNOSIS — Z315 Encounter for genetic counseling: Secondary | ICD-10-CM

## 2018-07-01 DIAGNOSIS — Z87891 Personal history of nicotine dependence: Secondary | ICD-10-CM | POA: Diagnosis not present

## 2018-07-01 DIAGNOSIS — Z8489 Family history of other specified conditions: Secondary | ICD-10-CM | POA: Insufficient documentation

## 2018-07-01 HISTORY — DX: Personal history of malignant neoplasm of breast: Z85.3

## 2018-07-01 NOTE — Progress Notes (Addendum)
REFERRING PROVIDER: Truitt Merle, MD Mia Contreras, [place] [address]  PRIMARY PROVIDER:  Plotnikov, Mia Lacks, MD  PRIMARY REASON FOR VISIT:  1. Malignant neoplasm of upper-inner quadrant of left breast in female, estrogen receptor positive ([place])   2. Family history of prostate cancer   3. Family history of skin cancer   4. Family history of lymphoma   5. Family history of brain tumor   6. Family history of lung cancer   7. Personal history of breast cancer     HISTORY OF PRESENT ILLNESS:   Mia Contreras, a 84 y.o. female, was seen for a [place] cancer genetics consultation at the request of Mia Contreras due to a personal and family history of cancer.  Mia Contreras presents to clinic today to discuss the possibility of a hereditary predisposition to cancer, genetic testing, and to further clarify her future cancer risks, as well as potential cancer risks for family members.   Mia Contreras has a history of left breast cancer dx at 79 (2000).  She recently has been diagnosed with a second breast cancer at the age of 61.  She underwent left breast lumpectomy on 06/11/2018.  Mia Contreras also reports that when she was 59, she had a D&C.  They found cancerous cells in her pathology, and she then proceeded with a total hysterectomy.    CANCER HISTORY:  Oncology History   Cancer Staging Malignant neoplasm of upper-inner quadrant of left breast in female, estrogen receptor positive ([place]) Staging form: Breast, AJCC 8th Edition - Clinical stage from 05/08/2018: Stage IA (cT1c, cN0, cM0, G1, ER+, PR+, HER2-) - Signed by Mia Merle, MD on 05/15/2018       Malignant neoplasm of upper-inner quadrant of left breast in female, estrogen receptor positive ([place])   04/30/2018 Mammogram    diagnostic mammogram on 04/30/2018 at [facility] that revealed 1.2 CM left breast mass.    04/30/2018 Breast US    Breast US revealed a 1.4 CM round solid mass that is suspicious of malignancy.    05/08/2018 Cancer  Staging    Staging form: Breast, AJCC 8th Edition - Clinical stage from 05/08/2018: Stage IA (cT1c, cN0, cM0, G1, ER+, PR+, HER2-) - Signed by Mia Merle, MD on 05/15/2018    05/08/2018 Receptors her2    ADDITIONAL INFORMATION: PROGNOSTIC INDICATORS Results: IMMUNOHISTOCHEMICAL AND MORPHOMETRIC ANALYSIS PERFORMED MANUALLY The tumor cells are Negative for Her2 (1+). Estrogen Receptor: 95%, POSITIVE, STRONG STAINING INTENSITY Progesterone Receptor: 90%, POSITIVE, STRONG STAINING INTENSITY Proliferation Marker Ki67: 10%    05/08/2018 Pathology Results    Diagnosis Breast, left, needle core biopsy, mass at 10 o'clock, 5 cm fn - INVASIVE DUCTAL CARCINOMA. - DUCTAL CARCINOMA IN SITU. - SEE COMMENT.    05/10/2018 Initial Diagnosis    Malignant neoplasm of upper-inner quadrant of left breast in female, estrogen receptor positive ([place])    06/11/2018 Pathology Results     06/11/2018 Surgical Pathology Diagnosis Breast, lumpectomy, left with radioactive seed bracketed - INVASIVE AND IN SITU DUCTAL CARCINOMA, 1.5 CM. - MARGINS NOT INVOLVED. - SEE ONCOLOGY TABLE.    06/11/2018 Surgery    Left breast lumpectomy with Mia Contreras on 06/11/2018.        HORMONAL RISK FACTORS:  Menarche was at age 78.  First live birth at age N/A.  Ovaries intact: no.  Hysterectomy: yes.  Menopausal status: postmenopausal.  Colonoscopy: yes; normal. Mammogram within the last year: yes.  Past Medical History:  Diagnosis Date  . AKI (acute  kidney injury) ([place]) 01/2016  . ANEMIA-NOS 08/20/2007  . ANXIETY 03/16/2007  . BREAST CANCER, HX OF 03/16/2007  . CARPAL TUNNEL SYNDROME, RIGHT 01/11/2008  . Cataract    floaters  . CELLULITIS, LEG, RIGHT 08/20/2007  . CHF (congestive heart failure) ([place])   . Collagenous colitis   . Congestive heart failure with right ventricular systolic dysfunction ([place]) 06/19/2018  . COPD (chronic obstructive pulmonary disease) ([place])   . DIVERTICULOSIS, COLON 08/20/2007  .  Dyspnea    on exertion  . Dysrhythmia    atrial fib  . Family history of brain tumor   . Family history of lung cancer   . Family history of lymphoma   . Family history of prostate cancer   . Family history of skin cancer   . GOITER, MULTINODULAR 05/06/2010   Mia Contreras  . HYPERLIPIDEMIA 08/20/2007  . HYPERTENSION 03/16/2007  . HYPERTHYROIDISM 05/23/2010  . IBS (irritable bowel syndrome)   . OSTEOARTHRITIS 03/16/2007  . OSTEOPOROSIS 08/20/2007  . Personal history of breast cancer 07/01/2018  . Pneumonia 07/2011   took abx for several weeks  . PREMATURE ATRIAL CONTRACTIONS 09/21/2010  . SCOLIOSIS 01/11/2008  . Severe tricuspid regurgitation 06/19/2018  . SKIN CANCER, HX OF 11/12/2008    R cheek 2010, L Cheek 2011 Mia Contreras  . Thyroid nodule   . Uterine cancer ([place])   . VENOUS INSUFFICIENCY 09/05/2007    Past Surgical History:  Procedure Laterality Date  . ABDOMINAL HYSTERECTOMY    . APPENDECTOMY  1972  . BREAST LUMPECTOMY Left 2001  . BREAST LUMPECTOMY WITH RADIOACTIVE SEED LOCALIZATION Left 06/11/2018   Procedure: LEFT BREAST LUMPECTOMY WITH BRACKETED RADIOACTIVE SEED LOCALIZATION;  Surgeon: Mia Skates, MD;  Location: [facility];  Service: General;  Laterality: Left;  . CARPAL TUNNEL RELEASE    . CATARACT EXTRACTION    . EYE SURGERY     cataract ext  . HERNIA REPAIR    . MASTECTOMY, PARTIAL Left   . SKIN CANCER EXCISION     face-Mia Contreras  . TOTAL KNEE ARTHROPLASTY    . TOTAL KNEE ARTHROPLASTY Right 2010   Mia Contreras  . TOTAL KNEE ARTHROPLASTY  10/30/2011   Procedure: TOTAL KNEE ARTHROPLASTY;  Surgeon: Mia Junes, MD;  Location: [facility];  Service: Orthopedics;  Laterality: Left;     . XRT     chemo, surgery    Social History   Socioeconomic History  . Marital status: Single    Spouse name: Not on file  . Number of children: Not on file  . Years of education: Not on file  . Highest education level: Not on file  Occupational History  . Not on file  Social Needs   . Financial resource strain: Not on file  . Food insecurity:    Worry: Not on file    Inability: Not on file  . Transportation needs:    Medical: Not on file    Non-medical: Not on file  Tobacco Use  . Smoking status: Former Smoker    Last attempt to quit: 12/20/1980    Years since quitting: 37.5  . Smokeless tobacco: Never Used  Substance and Sexual Activity  . Alcohol use: Not Currently  . Drug use: No  . Sexual activity: Not Currently  Lifestyle  . Physical activity:    Days per week: Not on file    Minutes per session: Not on file  . Stress: Not on file  Relationships  . Social connections:  Talks on phone: Not on file    Gets together: Not on file    Attends religious service: Not on file    Active member of club or organization: Not on file    Attends meetings of clubs or organizations: Not on file    Relationship status: Not on file  Other Topics Concern  . Not on file  Social History Narrative  . Not on file     FAMILY HISTORY:  We obtained a detailed, 4-generation family history.  Significant diagnoses are listed below: Family History  Problem Relation Age of Onset  . Dementia Mother   . Heart disease Mother   . Mental retardation Mother   . Hypertension Mother   . Alzheimer's disease Mother   . Heart attack Father   . Alcohol abuse Father   . Prostate cancer Brother        dx >50  . Bladder Cancer Brother        dx >50  . Diabetes Other   . Brain cancer Maternal Aunt 70  . Brain cancer Maternal Uncle 40  . Lung cancer Paternal Uncle   . Heart attack Maternal Grandmother   . Heart attack Maternal Grandfather   . Lymphoma Other 38       NH  . Skin cancer Other   . Colon cancer Neg Hx   . Anesthesia problems Neg Hx     Ms. Streeter has  No children. She has a brother who is 28 and has a history of prostate cancer and bladder cancer (dx over 64).  He had radiation seeds for treatment of his prostate cancer.  He has 2 sons-1 died of NH lymphoma at  67 and the other is 54 with a hx of skin cancer on his neck.   Ms. Lingle's father: died at 12 due to a heart attack. Paternal Aunts/Uncles: 2 paternal uncles, 1 had lung cancer.  Paternal cousins: no hx of cancer Paternal grandfather: no hx of cancer Paternal grandmother:no hx of cancer  Ms. Demery's mother: died at 60.  She had heart disease. Maternal Aunts/Uncles: 3 maternal uncles, 1 had lung cancer (hx of smoking), 1 died of a brain tumor at 51, and the other died of a heart attack.  3 maternal aunts, 1 died of a brain tumor in her 74's.  Maternal cousins: no known hx of cancer Maternal grandfather: died of a heart attack I his 28's Maternal grandmother:died of a heart attack in her 63's  Ms. Portier is unaware of previous family history of genetic testing for hereditary cancer risks. Patient's maternal ancestors are of [place]/French descent, and paternal ancestors are of Dutch/French descent. There is no reported Ashkenazi Jewish ancestry. There is no known consanguinity.  GENETIC COUNSELING ASSESSMENT: SHERLYN EBBERT is a 84 y.o. female with a personal and family history which is somewhat suggestive of a Hereditary Cancer Predisposition Syndrome. We, therefore, discussed and recommended the following at today's visit.   DISCUSSION: We reviewed the characteristics, features and inheritance patterns of hereditary cancer syndromes. We also discussed genetic testing, including the appropriate family members to test, the process of testing, insurance coverage and turn-around-time for results. We discussed the implications of a negative, positive and/or variant of uncertain significant result. We recommended Ms. Quillin pursue genetic testing for the Common Hereditary Cancesr gene panel + Brain/Nervous system panel + EGFR (lung cancer)  The Common Hereditary Cancer Panel offered by [facility] includes sequencing and/or deletion duplication testing of the following 54 genes:  APC, ATM, AXIN2,  BARD1, BMPR1A, BRCA1, BRCA2, BRIP1, BUB1B, CDH1, CDK4, CDKN2A, CEP57, CHEK2, CTNNA1, DICER1, ENG, EPCAM, GALNT12, GREM1, HOXB13, KIT, MEN1, MLH1, MLH3, MSH2, MSH3, MSH6, MUTYH, NBN, NF1, NTHL1, PALB2, PDGFRA, PMS2, POLD1, POLE, PTEN, RAD50, RAD51C, RAD51D, RNF43, RPS20, SDHA, SDHB, SDHC, SDHD, SMAD4, SMARCA4, STK11, TP53, TSC1, TSC2, VHL  [facility] Nervous System/Brain Cancer Panel. AIP ALK APC DICER1 EPCAM HRAS LZTR1 MEN1 MLH1 MSH2 MSH6 NF1 NF2 PHOX2B PMS2 PRKAR1A PTCH1 PTEN RB1 SMARCA4 SMARCB1 SMARCE1 SUFU TP53 TSC1 TSC2 VHL   We discussed that only 5-10% of cancers are associated with a Hereditary cancer predisposition syndrome.  One of the most common hereditary cancer syndromes that increases breast cancer risk is called Hereditary Breast and Ovarian Cancer (HBOC) syndrome.  This syndrome is caused by mutations in the BRCA1 and BRCA2 genes.  This syndrome increases an individual's lifetime risk to develop breast, ovarian, pancreatic, and other types of cancer.  There are also many other cancer predisposition syndromes caused by mutations in several other genes.  We discussed that if she is found to have a mutation in one of these genes, it may impact future medical management recommendations such as increased cancer screenings and consideration of risk reducing surgeries.  A positive result could also have implications for the patient's family members.  Her personal history of uterine cancer at 76 is also somewhat suggestive of Lynch Syndrome- we will make sure we analyze these genes as well.  A Negative result would mean we were unable to identify a hereditary component to her cancer, but does not rule out the possibility of a hereditary basis for her  cancer.  There could be mutations that are undetectable by current technology, or in genes not yet tested or identified to increase cancer risk.    We discussed the potential to find a Variant of Uncertain Significance or VUS.  These are variants that  have not yet been identified as pathogenic or benign, and it is unknown if this variant is associated with increased cancer risk or if this is a normal finding.  Most VUS's are reclassified to benign or likely benign.   It should not be used to make medical management decisions. With time, we suspect the lab will determine the significance of any VUS's identified if any.   Based on Ms. Strough's personal and family history of cancer, she meets medical criteria for genetic testing. Despite that she meets criteria, she may still have an out of pocket cost. The laboratory can provide her with an estimate of her OOP cost.  she was given the contact information for the laboratory if she has further questions. Marland Kitchen   PLAN: After considering the risks, benefits, and limitations, Ms. Cieslinski  provided informed consent to pursue genetic testing and the blood sample was sent to [facility] for analysis of the Common Hereditary Cancers panel + Brain/Nervous System Cancer Panel + EGFR. Results should be available within approximately 2-3 weeks' time, at which point they will be disclosed by telephone to Ms. Shaker, as will any additional recommendations warranted by these results. Ms. Conklin will receive a summary of her genetic counseling visit and a copy of her results once available. This information will also be available in Epic. We encouraged Ms. Mikula to remain in contact with cancer genetics annually so that we can continuously update the family history and inform her of any changes in cancer genetics and testing that may be of benefit for her family. Ms. Pong questions were answered  to her satisfaction today. Our contact information was provided should additional questions or concerns arise.  Lastly, we encouraged Ms. Pucillo to remain in contact with cancer genetics annually so that we can continuously update the family history and inform her of any changes in cancer genetics and testing that may  be of benefit for this family.   Ms.  Manternach questions were answered to her satisfaction today. Our contact information was provided should additional questions or concerns arise. Thank you for the referral and allowing [name] to share in the care of your patient.   Tana Felts, MS, [facility] Certified Genetic Counselor [email or web address] phone: [phone]  The patient was seen for a total of 30 minutes in face-to-face genetic counseling. The patient was accompanied today by her friend who helped her get to her appointment. This patient was discussed with Drs. Magrinat, Lindi Adie and/or Burr Contreras who agrees with the above.

## 2018-07-02 ENCOUNTER — Ambulatory Visit (INDEPENDENT_AMBULATORY_CARE_PROVIDER_SITE_OTHER): Payer: Medicare Other | Admitting: Internal Medicine

## 2018-07-02 ENCOUNTER — Encounter: Payer: Self-pay | Admitting: Internal Medicine

## 2018-07-02 VITALS — BP 124/82 | HR 61 | Temp 98.2°F | Ht 63.0 in | Wt 120.0 lb

## 2018-07-02 DIAGNOSIS — I4891 Unspecified atrial fibrillation: Secondary | ICD-10-CM

## 2018-07-02 DIAGNOSIS — S81809D Unspecified open wound, unspecified lower leg, subsequent encounter: Secondary | ICD-10-CM

## 2018-07-02 DIAGNOSIS — S81009D Unspecified open wound, unspecified knee, subsequent encounter: Secondary | ICD-10-CM | POA: Diagnosis not present

## 2018-07-02 DIAGNOSIS — I10 Essential (primary) hypertension: Secondary | ICD-10-CM

## 2018-07-02 DIAGNOSIS — I5022 Chronic systolic (congestive) heart failure: Secondary | ICD-10-CM

## 2018-07-02 DIAGNOSIS — S91009D Unspecified open wound, unspecified ankle, subsequent encounter: Secondary | ICD-10-CM

## 2018-07-02 MED ORDER — SACUBITRIL-VALSARTAN 24-26 MG PO TABS: 1.0000 | ORAL_TABLET | Freq: Two times a day (BID) | ORAL | 5 refills | Status: DC

## 2018-07-02 MED ORDER — ALPRAZOLAM 0.5 MG PO TABS: 0.5000 mg | ORAL_TABLET | Freq: Two times a day (BID) | ORAL | 3 refills | Status: DC | PRN

## 2018-07-02 NOTE — Patient Instructions (Addendum)
You can try MANUKA honey on the wound M?nuka honey is produced by European honey bees ([employer]) foraging on the m?nuka [employer]), which evidence suggests originated in [place] before the onset of the Miocene aridity.[1] It grows uncultivated throughout both [place] and [place] honey is markedly viscous. This property is due to the presence of a protein or colloid and is its main visually defining character, along with its typical dark cream to dark brown colour.[4][5]

## 2018-07-02 NOTE — Assessment & Plan Note (Signed)
Eliquis 

## 2018-07-02 NOTE — Assessment & Plan Note (Addendum)
Wound clinic ref  try MANUKA honey on the wound

## 2018-07-02 NOTE — Progress Notes (Signed)
Subjective:  Patient ID: Mia Contreras, female    DOB: 1928-11-13  Age: 84 y.o. MRN: [id number]  CC: No chief complaint on file.   HPI Mia Contreras presents for post-hosp f/u for CHF - d/c'd on 10/31 F/u anxiety, HTN C/o leg wound x 2 mo  Per hx: "Discharge Diagnoses:  1. Acute on chronicbi-systolic CHF, severe TR,nonischemic cardiomyopathy,pulmonary hypertension (principal) 2. Permanent atrial fibrillation, rate controlled. 3. CKD stage III 4. Status post left breast lumpectomy 10/45for recurrent left breast cancer.  Discharge Condition: improved Disposition: home  Diet recommendation: heart healthy      Filed Weights   06/19/18 0123 06/20/18 0449  Weight: 56.4 kg 53 kg    History of present illness:  84 year old woman PMH chronic systolic CHF, pulmonary hypertension, atrial fibrillation,Status post left breast lumpectomy 10/22, presented with fluid gain and shortness of breath, sent to the emergency department on advice of patient's cardiologist. Admitted for acute CHF.  Hospital Course:  Patient responded well with excellent diuresis, hospitalization was uncomplicated.  Echocardiogram revealed a significant decrease in LVEF, patient was seen by cardiology with recommendation for outpatient follow-up, no change in medications.  Follow-up with cardiology has been arranged as an outpatient.  Individual issues as below.  Acute on chronicbi-systolic CHF, severe TR,nonischemic cardiomyopathy,pulmonary hypertension --Excellent diuresis, -3.8 L since admission. --Lower extremity edema has resolved.  Patient appears euvolemic at this time.  Permanent atrial fibrillation, rate controlled. --Stable.  Continue apixaban, carvedilol  CKD stage III --At baseline  Status post left breast lumpectomy 10/38for recurrent left breast cancer.  Procedures:  Echo Study Conclusions  - Left ventricle: The cavity size was normal. Wall thickness  was normal. Systolic function was severely reduced. The estimated ejection fraction was in the range of 20% to 25%. Dyskinesis of the anteroseptal myocardium. - Mitral valve: There was mild regurgitation. - Left atrium: The atrium was mildly dilated. - Right ventricle: The cavity size was severely dilated. Systolic function was moderately reduced. - Right atrium: The atrium was severely dilated. - Tricuspid valve: There was moderate-severe regurgitation. - Pulmonary arteries: Systolic pressure was moderately increased. PA peak pressure: 58 mm Hg (S)."   Outpatient Medications Prior to Visit  Medication Sig Dispense Refill  . acetaminophen (TYLENOL) 650 MG CR tablet Take 650 mg by mouth every 8 (eight) hours as needed for pain.    Marland Kitchen ALPRAZolam (XANAX) 0.5 MG tablet Take 1 tablet (0.5 mg total) by mouth 2 (two) times daily as needed for anxiety. 60 tablet 3  . apixaban (ELIQUIS) 2.5 MG TABS tablet Take 1 tablet (2.5 mg total) by mouth 2 (two) times daily. 60 tablet 11  . carvedilol (COREG) 3.125 MG tablet Take 1 tablet (3.125 mg total) by mouth 2 (two) times daily with a meal. 60 tablet 11  . coal tar (NEUTROGENA T-GEL) 0.5 % shampoo Apply topically 2 (two) times a week.     Marland Kitchen ketoconazole (NIZORAL) 2 % shampoo Apply 1 application topically 2 (two) times a week.    Vladimir Faster Glycol-Propyl Glycol (SYSTANE ULTRA PF OP) Place 2 drops into both eyes as needed (for dry eyes).    . torsemide (DEMADEX) 20 MG tablet TAKE TWO TABLETS BY MOUTH DAILY 180 tablet 5  . triamcinolone ointment (KENALOG) 0.1 % Apply 1 application topically 2 (two) times daily. Use qd -bid 80 g 2  . vitamin C (ASCORBIC ACID) 500 MG tablet Take 1 tablet (500 mg total) by mouth daily. 100 tablet 3   No facility-administered  medications prior to visit.     ROS: Review of Systems  Constitutional: Positive for fatigue. Negative for activity change, appetite change, chills and unexpected weight change.  HENT:  Negative for congestion, mouth sores and sinus pressure.   Eyes: Negative for visual disturbance.  Respiratory: Negative for cough and chest tightness.   Cardiovascular: Positive for palpitations.  Gastrointestinal: Negative for abdominal pain and nausea.  Genitourinary: Negative for difficulty urinating, frequency and vaginal pain.  Musculoskeletal: Negative for back pain and gait problem.  Skin: Negative for pallor and rash.  Neurological: Negative for dizziness, tremors, weakness, numbness and headaches.  Psychiatric/Behavioral: Negative for confusion and sleep disturbance.    Objective:  There were no vitals taken for this visit.  BP Readings from Last 3 Encounters:  07/01/18 123/63  06/24/18 130/70  06/24/18 114/70    Wt Readings from Last 3 Encounters:  07/01/18 120 lb 3.2 oz (54.5 kg)  06/24/18 118 lb (53.5 kg)  06/24/18 119 lb 6.4 oz (54.2 kg)    Physical Exam  Constitutional: She appears well-developed. No distress.  HENT:  Head: Normocephalic.  Right Ear: External ear normal.  Left Ear: External ear normal.  Nose: Nose normal.  Mouth/Throat: Oropharynx is clear and moist.  Eyes: Pupils are equal, round, and reactive to light. Conjunctivae are normal. Right eye exhibits no discharge. Left eye exhibits no discharge.  Neck: Normal range of motion. Neck supple. No JVD present. No tracheal deviation present. No thyromegaly present.  Cardiovascular: Normal rate and normal heart sounds.  Pulmonary/Chest: No stridor. No respiratory distress. She has no wheezes.  Abdominal: Soft. Bowel sounds are normal. She exhibits no distension and no mass. There is no tenderness. There is no rebound and no guarding.  Musculoskeletal: She exhibits no edema or tenderness.  Lymphadenopathy:    She has no cervical adenopathy.  Neurological: She displays normal reflexes. No cranial nerve deficit. She exhibits normal muscle tone. Coordination abnormal.  Skin: No rash noted. No erythema.    Psychiatric: She has a normal mood and affect. Her behavior is normal. Judgment and thought content normal.  irreg HR Ataxic gait Wound on R ankle 2x2 cm   Lab Results  Component Value Date   WBC 7.1 06/18/2018   HGB 13.3 06/18/2018   HCT 41.9 06/18/2018   PLT 197 06/18/2018   GLUCOSE 82 06/24/2018   CHOL 130 11/04/2015   TRIG 77.0 11/04/2015   HDL 54.50 11/04/2015   LDLCALC 60 11/04/2015   ALT 27 06/17/2018   AST 33 06/17/2018   NA 142 06/24/2018   K 4.1 06/24/2018   CL 94 (L) 06/24/2018   CREATININE 1.39 (H) 06/24/2018   BUN 22 06/24/2018   CO2 29 06/24/2018   TSH 1.82 05/06/2018   INR 0.95 10/25/2011   HGBA1C 5.8 05/06/2018   MICROALBUR 0.6 03/23/2011    Dg Chest 2 View  Result Date: 06/18/2018 CLINICAL DATA:  Bilateral lower extremity swelling for 3 days. CHF. Recent left breast lumpectomy. EXAM: CHEST - 2 VIEW COMPARISON:  06/17/2018 FINDINGS: The cardiac silhouette remains enlarged. Aortic atherosclerosis and a large hiatal hernia are again noted. There is mild chronic interstitial coarsening which is likely related to patient's history of COPD. No airspace consolidation, overt pulmonary edema, pleural effusion, or pneumothorax is identified. Surgical clips project over the left breast and axilla. Degenerative changes are noted at the shoulders, and there is severe lumbar levoscoliosis. IMPRESSION: 1. No active cardiopulmonary disease. 2. Cardiomegaly, COPD, and large hiatal hernia. Electronically Signed  By: Logan Bores M.D.   On: 06/18/2018 20:17    Assessment & Plan:   There are no diagnoses linked to this encounter.   No orders of the defined types were placed in this encounter.    Follow-up: No follow-ups on file.  Walker Kehr, MD

## 2018-07-02 NOTE — Assessment & Plan Note (Signed)
Demadex, Coreg 11/19 trial of Entresto - low dose RTC 6 mo

## 2018-07-02 NOTE — Assessment & Plan Note (Addendum)
Demadex, Coreg trial of Entresto - low dose F/u w/dr Johnsie Cancel RTC 6 mo

## 2018-07-08 ENCOUNTER — Telehealth: Payer: Self-pay | Admitting: Cardiovascular Disease

## 2018-07-08 NOTE — Telephone Encounter (Signed)
Called patient back. Informed patient that Mia Contreras is for heart failure, and it is NOT a blood thinner. Patient verbalized understanding and stated she wanted Dr. Johnsie Cancel to know that she was taking this Entresto now. Patient also wanted to know if she had debridement on her wound did she need to stop her Eliquis. Informed patient that her message would be sent to Dr. Johnsie Cancel for further advisement. Informed patient that it would be helpful if she contacted her wound doctor to have them send [name] a request to stop eliquis. Patient verbalized understanding.

## 2018-07-08 NOTE — Telephone Encounter (Signed)
Patient has a wound that has not healed.  She sees the doctor  in two weeks for a debridement.  She wants to know if she needs to be off her eliquis when she goes for her debridement.      Pt c/o medication issue:  1. Name of Medication: sacubitril-valsartan (ENTRESTO) 24-26 MG  2. How are you currently taking this medication (dosage and times per day)?   3. Are you having a reaction (difficulty breathing--STAT)? Stomach issues  4. What is your medication issue? She also states her family doctor put her on Entresto. She wants to know if she needs to be on two blood thinners.

## 2018-07-09 NOTE — Telephone Encounter (Signed)
Would ask person doing debridement if they want it held ok to do

## 2018-07-10 NOTE — Telephone Encounter (Signed)
Patient stated she talked to Dr. Alford Highland and she would not need to be off her eliquis for the debridement. Informed patient that is wonderful and have a good day.

## 2018-07-11 ENCOUNTER — Telehealth: Payer: Self-pay | Admitting: Internal Medicine

## 2018-07-11 ENCOUNTER — Ambulatory Visit: Payer: Self-pay

## 2018-07-11 MED ORDER — AZITHROMYCIN 250 MG PO TABS: ORAL_TABLET | ORAL | 0 refills | Status: DC

## 2018-07-11 NOTE — Telephone Encounter (Signed)
I emailed a Rx for a Zpack Use OTC Robitussin Thx

## 2018-07-11 NOTE — Telephone Encounter (Signed)
Returned call to patient who was very upset because I was trying to ask questions she say she already answered. Pt refused triage. She states she just wants Dr Alain Marion to know that she is still coughing especially at night. Phlegm is clear. Her temperature is now 97.7.  She does not want to get sick with bronchitis and is requesting something to make her better. She also wants the doctor to know that she has occasional diarrhea. She states she is drinking well. Pt cautioned to call if she starts to feel dizzy or weak. S/S of dehydration. Pt refused to schedule appointment. Per pt request message will be routed to office.  Reason for Disposition . [1] Caller is not with the adult (patient) AND [2] probable NON-URGENT symptoms  Answer Assessment - Initial Assessment Questions 1. REASON FOR CALL or QUESTION: "What is your reason for calling today?" or "How can I best help you?" or "What question do you have that I can help answer?"     Pt just want to talk to the office and tell them I still have cough with Phlegm  Protocols used: INFORMATION ONLY CALL-A-AH

## 2018-07-11 NOTE — Telephone Encounter (Signed)
Patient called the office stating that her tempeture has gone up..  She said that she was speaking with someone earlier today who told her to call back if her tempeture went up or down. There was no documenting in the chart.  Patient states that she has been having chills, clear phlegm, not feeling well for the past couple of weeks. This morning around 10:30am her tempeture was 95.8. Whoever he spoke with said to call back if her temp went up or down.. So she called back stating at 11:00am her temp was 97.7 (normal is 97.9).   Please advise.

## 2018-07-12 NOTE — Telephone Encounter (Signed)
Tried calling pt no answer and can't leave vm due to vm not set-up. Will retry later.Marland KitchenJohny Contreras

## 2018-07-12 NOTE — Telephone Encounter (Signed)
Pt.notified

## 2018-07-12 NOTE — Telephone Encounter (Signed)
Pt return call back gave her MD response below.Marland KitchenJohny Contreras

## 2018-07-15 ENCOUNTER — Telehealth: Payer: Self-pay | Admitting: Cardiovascular Disease

## 2018-07-15 DIAGNOSIS — R0602 Shortness of breath: Secondary | ICD-10-CM

## 2018-07-15 DIAGNOSIS — M7989 Other specified soft tissue disorders: Secondary | ICD-10-CM

## 2018-07-15 NOTE — Telephone Encounter (Incomplete)
New Message         Pt c/o swelling: STAT is pt has developed SOB within 24 hours  1) How much weight have you gained and in what time span? 1 pd  2) If swelling, where is the swelling located? Feet/ankles  3) Are you currently taking a fluid pill? Yes  4) Are you currently SOB? Yes  5) Do you have a log of your daily weights (if so, list)? No  6) Have you gained 3 pounds in a day or 5 pounds in a week? No  7) Have you traveled recently? ***

## 2018-07-15 NOTE — Telephone Encounter (Signed)
Patient calling and complaining of swelling in her feet. Patient stated she had some soup and crackers that a friend of hers made. Informed patient that she needs to keep a low salt diet, and that she has probably had too much salt that is contributing to her swelling. Encouraged patient to elevate her feet, wear her TED hose, and keep a low salt diet. Informed patient that if she gains 3 pounds in a 24 hour period or 5 pounds in 1 week to give our office a call. Patient also stated she has been having diarrhea due to antibiotics. Encouraged patient to keep hydrated. Patient wanted to let Dr. Johnsie Cancel know. Will forward to him for further advisement.

## 2018-07-15 NOTE — Telephone Encounter (Signed)
No changes f/u primary for diarrhea may need cdiff titers done

## 2018-07-16 ENCOUNTER — Telehealth: Payer: Self-pay | Admitting: Internal Medicine

## 2018-07-16 NOTE — Telephone Encounter (Signed)
Pt has appt tomorrow w/Dr. Camila Li in the am../lmb

## 2018-07-16 NOTE — Telephone Encounter (Signed)
Pt called cardiology office and told them about ATX causing her to have diarrhea. They told her to call PCP for a possible CDiff order. Please advise

## 2018-07-16 NOTE — Telephone Encounter (Signed)
Called patient back with Dr. Kyla Balzarine advise. Patient stated she would call her PCP and let him know she has been having diarrhea.

## 2018-07-17 ENCOUNTER — Encounter: Payer: Self-pay | Admitting: Internal Medicine

## 2018-07-17 ENCOUNTER — Ambulatory Visit (INDEPENDENT_AMBULATORY_CARE_PROVIDER_SITE_OTHER): Payer: Medicare Other | Admitting: Internal Medicine

## 2018-07-17 ENCOUNTER — Other Ambulatory Visit (INDEPENDENT_AMBULATORY_CARE_PROVIDER_SITE_OTHER): Payer: Medicare Other

## 2018-07-17 DIAGNOSIS — I5022 Chronic systolic (congestive) heart failure: Secondary | ICD-10-CM

## 2018-07-17 DIAGNOSIS — R197 Diarrhea, unspecified: Secondary | ICD-10-CM

## 2018-07-17 DIAGNOSIS — R600 Localized edema: Secondary | ICD-10-CM

## 2018-07-17 LAB — BASIC METABOLIC PANEL
BUN: 34 mg/dL — AB (ref 6–23)
CHLORIDE: 98 meq/L (ref 96–112)
CO2: 34 mEq/L — ABNORMAL HIGH (ref 19–32)
Calcium: 9.9 mg/dL (ref 8.4–10.5)
Creatinine, Ser: 1.45 mg/dL — ABNORMAL HIGH (ref 0.40–1.20)
GFR: 36.3 mL/min — AB (ref >60.00)
Glucose, Bld: 76 mg/dL (ref 70–99)
POTASSIUM: 4.2 meq/L (ref 3.5–5.1)
SODIUM: 140 meq/L (ref 135–145)

## 2018-07-17 NOTE — Patient Instructions (Signed)
Please hold Entresto for 4-5 days  Try Imodium AD 1-2 tablets 3 times a day as needed for diarrhea

## 2018-07-17 NOTE — Progress Notes (Signed)
Subjective:  Patient ID: Mia Contreras, female    DOB: [birth year]  Age: 84 y.o. MRN: [id number]  CC: No chief complaint on file.   HPI [place] presents for diarrhea x2 weeks (4-6 times a day) when she started to use Entresto 07/02/18) F/u CHF C/o wt gain and edema   Outpatient Medications Prior to Visit  Medication Sig Dispense Refill  . ALPRAZolam (XANAX) 0.5 MG tablet Take 1 tablet (0.5 mg total) by mouth 2 (two) times daily as needed for anxiety. 60 tablet 3  . apixaban (ELIQUIS) 2.5 MG TABS tablet Take 1 tablet (2.5 mg total) by mouth 2 (two) times daily. 60 tablet 11  . azithromycin (ZITHROMAX Z-PAK) 250 MG tablet As directed 6 tablet 0  . carvedilol (COREG) 3.125 MG tablet Take 1 tablet (3.125 mg total) by mouth 2 (two) times daily with a meal. 60 tablet 11  . coal tar (NEUTROGENA T-GEL) 0.5 % shampoo Apply topically 2 (two) times a week.     Marland Kitchen ketoconazole (NIZORAL) 2 % shampoo Apply 1 application topically 2 (two) times a week.    Vladimir Faster Glycol-Propyl Glycol (SYSTANE ULTRA PF OP) Place 2 drops into both eyes as needed (for dry eyes).    . sacubitril-valsartan (ENTRESTO) 24-26 MG Take 1 tablet by mouth 2 (two) times daily. 60 tablet 5  . torsemide (DEMADEX) 20 MG tablet TAKE TWO TABLETS BY MOUTH DAILY 180 tablet 5  . triamcinolone ointment (KENALOG) 0.1 % Apply 1 application topically 2 (two) times daily. Use qd -bid 80 g 2  . vitamin C (ASCORBIC ACID) 500 MG tablet Take 1 tablet (500 mg total) by mouth daily. 100 tablet 3   No facility-administered medications prior to visit.     ROS: Review of Systems  Constitutional: Positive for fatigue and unexpected weight change. Negative for activity change, appetite change and chills.  HENT: Negative for congestion, mouth sores and sinus pressure.   Eyes: Negative for visual disturbance.  Respiratory: Negative for cough and chest tightness.   Cardiovascular: Positive for leg swelling.  Gastrointestinal: Positive  for diarrhea. Negative for abdominal pain, blood in stool, nausea and vomiting.  Genitourinary: Negative for difficulty urinating, frequency and vaginal pain.  Musculoskeletal: Negative for back pain and gait problem.  Skin: Positive for wound. Negative for pallor and rash.  Neurological: Negative for dizziness, tremors, weakness, numbness and headaches.  Psychiatric/Behavioral: Negative for confusion and sleep disturbance.    Objective:  BP 122/80 (BP Location: Right Arm, Patient Position: Sitting, Cuff Size: Normal)   Pulse 97   Temp 98.4 F (36.9 C) (Oral)   Ht 5\' 3"  (1.6 m)   Wt 127 lb (57.6 kg)   SpO2 (!) 89% Comment: cold hard  BMI 22.50 kg/m   BP Readings from Last 3 Encounters:  07/17/18 122/80  07/02/18 124/82  07/01/18 123/63    Wt Readings from Last 3 Encounters:  07/17/18 127 lb (57.6 kg)  07/02/18 120 lb (54.4 kg)  07/01/18 120 lb 3.2 oz (54.5 kg)    Physical Exam  Constitutional: She appears well-developed. No distress.  HENT:  Head: Normocephalic.  Right Ear: External ear normal.  Left Ear: External ear normal.  Nose: Nose normal.  Mouth/Throat: Oropharynx is clear and moist.  Eyes: Pupils are equal, round, and reactive to light. Conjunctivae are normal. Right eye exhibits no discharge. Left eye exhibits no discharge.  Neck: Normal range of motion. Neck supple. No JVD present. No tracheal deviation present. No thyromegaly present.  Cardiovascular: Normal rate, regular rhythm and normal heart sounds.  Pulmonary/Chest: No stridor. No respiratory distress. She has no wheezes.  Abdominal: Soft. Bowel sounds are normal. She exhibits no distension and no mass. There is no tenderness. There is no rebound and no guarding.  Musculoskeletal: She exhibits edema. She exhibits no tenderness.  Lymphadenopathy:    She has no cervical adenopathy.  Neurological: She displays normal reflexes. No cranial nerve deficit. She exhibits normal muscle tone. Coordination abnormal.   Skin: No rash noted. No erythema.  Psychiatric: She has a normal mood and affect. Her behavior is normal. Judgment and thought content normal.  NAD Walker Trace ankle edema Wound is dressed  Lab Results  Component Value Date   WBC 7.1 06/18/2018   HGB 13.3 06/18/2018   HCT 41.9 06/18/2018   PLT 197 06/18/2018   GLUCOSE 82 06/24/2018   CHOL 130 11/04/2015   TRIG 77.0 11/04/2015   HDL 54.50 11/04/2015   LDLCALC 60 11/04/2015   ALT 27 06/17/2018   AST 33 06/17/2018   NA 142 06/24/2018   K 4.1 06/24/2018   CL 94 (L) 06/24/2018   CREATININE 1.39 (H) 06/24/2018   BUN 22 06/24/2018   CO2 29 06/24/2018   TSH 1.82 05/06/2018   INR 0.95 10/25/2011   HGBA1C 5.8 05/06/2018   MICROALBUR 0.6 03/23/2011    Dg Chest 2 View  Result Date: 06/18/2018 CLINICAL DATA:  Bilateral lower extremity swelling for 3 days. CHF. Recent left breast lumpectomy. EXAM: CHEST - 2 VIEW COMPARISON:  06/17/2018 FINDINGS: The cardiac silhouette remains enlarged. Aortic atherosclerosis and a large hiatal hernia are again noted. There is mild chronic interstitial coarsening which is likely related to patient's history of COPD. No airspace consolidation, overt pulmonary edema, pleural effusion, or pneumothorax is identified. Surgical clips project over the left breast and axilla. Degenerative changes are noted at the shoulders, and there is severe lumbar levoscoliosis. IMPRESSION: 1. No active cardiopulmonary disease. 2. Cardiomegaly, COPD, and large hiatal hernia. Electronically Signed   By: Logan Bores M.D.   On: 06/18/2018 20:17    Assessment & Plan:   There are no diagnoses linked to this encounter.   No orders of the defined types were placed in this encounter.    Follow-up: No follow-ups on file.  Walker Kehr, MD

## 2018-07-17 NOTE — Assessment & Plan Note (Signed)
hold Entresto due to loose stools

## 2018-07-17 NOTE — Progress Notes (Unsigned)
Clostridium

## 2018-07-17 NOTE — Assessment & Plan Note (Signed)
Worse 

## 2018-07-17 NOTE — Assessment & Plan Note (Signed)
New diarrhea x2 weeks (4-6 times a day) when she started to use Entresto 07/02/18) - hold Entresto, try Imodium AD 1-2 tid prn Stool tests

## 2018-07-19 ENCOUNTER — Other Ambulatory Visit: Payer: Medicare Other

## 2018-07-19 LAB — CLOSTRIDIUM DIFFICILE EIA: C difficile Toxins A+B, EIA: NEGATIVE

## 2018-07-19 LAB — SPECIMEN STATUS REPORT

## 2018-07-22 ENCOUNTER — Encounter: Payer: Self-pay | Admitting: Genetics

## 2018-07-22 ENCOUNTER — Telehealth: Payer: Self-pay | Admitting: Internal Medicine

## 2018-07-22 ENCOUNTER — Telehealth: Payer: Self-pay | Admitting: Genetics

## 2018-07-22 ENCOUNTER — Ambulatory Visit: Payer: Self-pay | Admitting: Genetics

## 2018-07-22 ENCOUNTER — Other Ambulatory Visit: Payer: Self-pay | Admitting: *Deleted

## 2018-07-22 DIAGNOSIS — Z808 Family history of malignant neoplasm of other organs or systems: Secondary | ICD-10-CM

## 2018-07-22 DIAGNOSIS — Z807 Family history of other malignant neoplasms of lymphoid, hematopoietic and related tissues: Secondary | ICD-10-CM

## 2018-07-22 DIAGNOSIS — Z853 Personal history of malignant neoplasm of breast: Secondary | ICD-10-CM

## 2018-07-22 DIAGNOSIS — Z8489 Family history of other specified conditions: Secondary | ICD-10-CM

## 2018-07-22 DIAGNOSIS — Z8042 Family history of malignant neoplasm of prostate: Secondary | ICD-10-CM

## 2018-07-22 DIAGNOSIS — Z170 Estrogen receptor positive status [ER+]: Secondary | ICD-10-CM

## 2018-07-22 DIAGNOSIS — C50212 Malignant neoplasm of upper-inner quadrant of left female breast: Secondary | ICD-10-CM

## 2018-07-22 DIAGNOSIS — Z1379 Encounter for other screening for genetic and chromosomal anomalies: Secondary | ICD-10-CM

## 2018-07-22 DIAGNOSIS — Z801 Family history of malignant neoplasm of trachea, bronchus and lung: Secondary | ICD-10-CM

## 2018-07-22 MED ORDER — METOLAZONE 5 MG PO TABS: 5.0000 mg | ORAL_TABLET | ORAL | 3 refills | Status: DC

## 2018-07-22 NOTE — Telephone Encounter (Signed)
Called patient about her message. Patient complaining of SOB, worse BLE swelling, and 2 lbs weight gain. Per Dr. Johnsie Cancel, start zaroxolyn 5 mg by mouth Monday, Wednesday, and Friday in the AM, and take torsemide in the afternoon. Per Dr. Johnsie Cancel have patient come in and see PA/NP. Made patient an appointment with Truitt Merle NP tomorrow. Patient agreed to plan.

## 2018-07-22 NOTE — Telephone Encounter (Signed)
Patient called and stated that [facility] never received the rx for the new medication that was sent in today. Made patient aware that I will resend this. She verbalized her understanding and appreciation.

## 2018-07-22 NOTE — Telephone Encounter (Signed)
Copied from [place] [id number]. Topic: Quick Communication - See Telephone Encounter >> Jul 22, 2018 12:14 PM Blase Mess A wrote: CRM for notification. See Telephone encounter for: 07/22/18.  The patient wanted Dr. Alain Marion know that her feet and legs are extremely swollen. She is working with her heart doctor on this.  She just wanted Dr. Alain Marion to know Please advise. [phone]

## 2018-07-22 NOTE — Telephone Encounter (Signed)
Revealed negative genetic testing.  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  (possibly mosaic) This normal result is reassuring and indicates that it is unlikely Mia Contreras's cancer is due to a hereditary cause.  It is unlikely that there is an increased risk of another cancer due to a mutation in one of these genes.  However, genetic testing is not perfect, and cannot definitively rule out a hereditary cause.  It will be important for her to keep in contact with genetics to learn if any additional testing may be needed in the future.

## 2018-07-22 NOTE — Telephone Encounter (Signed)
Noted. Thx.

## 2018-07-22 NOTE — Telephone Encounter (Signed)
FYI

## 2018-07-22 NOTE — Telephone Encounter (Signed)
Pt c/o swelling: STAT is pt has developed SOB within 24 hours  1) How much weight have you gained and in what time span? Doesn't know  2) If swelling, where is the swelling located? feet and calves  3) Are you currently taking a fluid pill? no  4) Are you currently SOB? no  5) Do you have a log of your daily weights (if so, list)? no  6) Have you gained 3 pounds in a day or 5 pounds in a week? Doesn't know  7) Have you traveled recently? No   Called 07-15-18 with same issues, but feels worse now-all the time not just when walking-pls advise [phone]

## 2018-07-22 NOTE — Progress Notes (Signed)
HPI:  [name] was previously seen in the [facility] clinic on 07/01/2018 due to a personal and family history of cancer and concerns regarding a hereditary predisposition to cancer. Please refer to our prior cancer genetics clinic note for more information regarding [name]'s medical, social and family histories, and our assessment and recommendations, at the time. [name] recent genetic test results were disclosed to her, as well as recommendations warranted by these results. These results and recommendations are discussed in more detail below.  CANCER HISTORY:  Oncology History   Cancer Staging Malignant neoplasm of upper-inner quadrant of left breast in female, estrogen receptor positive ([place]) Staging form: Breast, AJCC 8th Edition - Clinical stage from 05/08/2018: Stage IA (cT1c, cN0, cM0, G1, ER+, PR+, HER2-) - Signed by Truitt Merle, MD on 05/15/2018 - Pathologic stage from 06/11/2018: Stage Unknown (pT1c, pNX, cM0, G2, ER+, PR+, HER2-) - Signed by Truitt Merle, MD on 07/02/2018       Malignant neoplasm of upper-inner quadrant of left breast in female, estrogen receptor positive ([place])   04/30/2018 Mammogram    diagnostic mammogram on 04/30/2018 at [facility] that revealed 1.2 CM left breast mass.    04/30/2018 Breast US    Breast US revealed a 1.4 CM round solid mass that is suspicious of malignancy.    05/08/2018 Cancer Staging    Staging form: Breast, AJCC 8th Edition - Clinical stage from 05/08/2018: Stage IA (cT1c, cN0, cM0, G1, ER+, PR+, HER2-) - Signed by Truitt Merle, MD on 05/15/2018    05/08/2018 Receptors her2    ADDITIONAL INFORMATION: PROGNOSTIC INDICATORS Results: IMMUNOHISTOCHEMICAL AND MORPHOMETRIC ANALYSIS PERFORMED MANUALLY The tumor cells are Negative for Her2 (1+). Estrogen Receptor: 95%, POSITIVE, STRONG STAINING INTENSITY Progesterone Receptor: 90%, POSITIVE, STRONG STAINING INTENSITY Proliferation Marker Ki67: 10%    05/08/2018 Pathology Results    Diagnosis Breast, left, needle core biopsy, mass at 10 o'clock, 5 cm fn - INVASIVE DUCTAL CARCINOMA. - DUCTAL CARCINOMA IN SITU. - SEE COMMENT.    05/10/2018 Initial Diagnosis    Malignant neoplasm of upper-inner quadrant of left breast in female, estrogen receptor positive ([place])    06/11/2018 Pathology Results     06/11/2018 Surgical Pathology Diagnosis Breast, lumpectomy, left with radioactive seed bracketed - INVASIVE AND IN SITU DUCTAL CARCINOMA, 1.5 CM. - MARGINS NOT INVOLVED. - SEE ONCOLOGY TABLE.    06/11/2018 Surgery    Left breast lumpectomy with Dr. Dalbert Batman on 06/11/2018.      06/11/2018 Cancer Staging    Staging form: Breast, AJCC 8th Edition - Pathologic stage from 06/11/2018: Stage Unknown (pT1c, pNX, cM0, G2, ER+, PR+, HER2-) - Signed by Truitt Merle, MD on 07/02/2018      FAMILY HISTORY:  We obtained a detailed, 4-generation family history.  Significant diagnoses are listed below: Family History  Problem Relation Age of Onset  . Dementia Mother   . Heart disease Mother   . Mental retardation Mother   . Hypertension Mother   . Alzheimer's disease Mother   . Heart attack Father   . Alcohol abuse Father   . Prostate cancer Brother        dx >50  . Bladder Cancer Brother        dx >50  . Diabetes Other   . Brain cancer Maternal Aunt 70  . Brain cancer Maternal Uncle 40  . Lung cancer Paternal Uncle   . Heart attack Maternal Grandmother   . Heart attack Maternal Grandfather   . Lymphoma Other  98       [place]  . Skin cancer Other   . Colon cancer Neg Hx   . Anesthesia problems Neg Hx     [name] has  No children. She has a brother who is 40 and has a history of prostate cancer and bladder cancer (dx over 24).  He had radiation seeds for treatment of his prostate cancer.  He has 2 sons-1 died of NH lymphoma at 58 and the other is 105 with a hx of skin cancer on his neck.   [name]'s father: died at 50 due to a heart attack. Paternal Aunts/Uncles: 2  paternal uncles, 1 had lung cancer.  Paternal cousins: no hx of cancer Paternal grandfather: no hx of cancer Paternal grandmother:no hx of cancer  [name]'s mother: died at 63.  She had heart disease. Maternal Aunts/Uncles: 3 maternal uncles, 1 had lung cancer (hx of smoking), 1 died of a brain tumor at 63, and the other died of a heart attack.  3 maternal aunts, 1 died of a brain tumor in her 79's.  Maternal cousins: no known hx of cancer Maternal grandfather: died of a heart attack I his 26's Maternal grandmother:died of a heart attack in her 22's  [name] is unaware of previous family history of genetic testing for hereditary cancer risks. Patient's maternal ancestors are of [place] descent, and paternal ancestors are of [place]/French descent. There is no reported Ashkenazi Jewish ancestry. There is no known consanguinity.  GENETIC TEST RESULTS: Genetic testing performed through [facility]'s Common Hereditary Cancers Panel + Brain/Nervous system panel reported out on 07/19/2018 showed no pathogenic mutations.   The Common Hereditary Cancers Panel + Nervous system/Brain Cancer Panel was ordered (20 genes): AIP, ALK, APC, ATM, AXIN2, BARD1, BMPR1A, BRCA1, BRCA2, BRIP1, BUB1B, CDH1, CDK4, CDKN2A, CEP57, CHEK2, CTNNA1, DICER1, EGFR, ENG, EPCAM, GALNT12, GREM1, HOXB13, HRAS, LZTR1, KIT, MEN1, MLH1, MLH3, MSH2, MSH3, MSH6, MUTYH, NBN, NF1, NTHL1, PHOX2B, PALB2, PDGFRA, PRKAR1A,  PMS2, POLD1, POLE, PTCH1, PTEN, RB1, RAD50, RAD51C, RAD51D, RNF43, RPS20, SDHA, SDHB, SDHC, SDHD, SMAD4, SMARCA4, SMARCB1, SUFU, SMARCE1 STK11, TP53, TSC1, TSC2, VHL.  A variant of uncertain significance (VUS) in a gene called ATM was also noted. c.[id number]>C (p.Gly2020Arg).  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   The test report will be scanned into EPIC and will be located under the Molecular Pathology section of the Results Review tab. A portion of the result report is included below for reference.     We  discussed with [name] that because current genetic testing is not perfect, it is possible there may be a gene mutation in one of these genes that current testing cannot detect, but that chance is small.  We also discussed, that there could be another gene that has not yet been discovered, or that we have not yet tested, that is responsible for the cancer diagnoses in the family. It is also possible there is a hereditary cause for the cancer in the family that [name] did not inherit and therefore was not identified in her testing.  Therefore, it is important to remain in touch with cancer genetics in the future so that we can continue to offer [name] the most up to date genetic testing.   Regarding the VUS in ATM: At this time, it is unknown if this variant is associated with increased cancer risk or if this is a normal finding, but most variants such as this get reclassified to being inconsequential. It  should not be used to make medical management decisions. With time, we suspect the lab will determine the significance of this variant, if any. If we do learn more about it, we will try to contact [name] to discuss it further. However, it is important to stay in touch with [name] periodically and keep the address and phone number up to date. It was noted to be possibly mosaic- this means the VUS was found in some of the cells tested and not in others.  This can be due to somatic or germline mosaicism or laboratory error   ADDITIONAL GENETIC TESTING: We discussed with [name] that her genetic testing was fairly extensive.  If there are are genes identified to increase cancer risk that can be analyzed in the future, we would be happy to discuss and coordinate this testing at that time.    CANCER SCREENING RECOMMENDATIONS: [name] test result is considered negative (normal).  This means that we have not identified a hereditary cause for her personal and family history of cancer at this  time.   While reassuring, this does not definitively rule out a hereditary predisposition to cancer. It is still possible that there could be genetic mutations that are undetectable by current technology, or genetic mutations in genes that have not been tested or identified to increase cancer risk.  Therefore, it is recommended she continue to follow the cancer management and screening guidelines provided by her oncology and primary healthcare provider. An individual's cancer risk is not determined by genetic test results alone.  Overall cancer risk assessment includes additional factors such as personal medical history, family history, etc.  These should be used to make a personalized plan for cancer prevention and surveillance.    RECOMMENDATIONS FOR FAMILY MEMBERS:  Relatives in this family might be at some increased risk of developing cancer, over the general population risk, simply due to the family history of cancer.  We recommended women in this family have a yearly mammogram beginning at age 40, or 4 years younger than the earliest onset of cancer, an annual clinical breast exam, and perform monthly breast self-exams. Women in this family should also have a gynecological exam as recommended by their primary provider. All family members should have a colonoscopy by age 56 (or as directed by their doctors).  All family members should inform their physicians about the family history of cancer so their doctors can make the most appropriate screening recommendations for them.   It is also possible there is a hereditary cause for the cancer in Ms. Spano's family that she did not inherit and therefore was not identified in her.  Therefore, we recommended her maternal relatives discuss genetic testing with their providers.    FOLLOW-UP: Lastly, we discussed with [name] that cancer genetics is a rapidly advancing field and it is possible that new genetic tests will be appropriate for her and/or her  family members in the future. We encouraged her to remain in contact with cancer genetics on an annual basis so we can update her personal and family histories and let her know of advances in cancer genetics that may benefit this family.   Our contact number was provided. [name] questions were answered to her satisfaction, and she knows she is welcome to call us at anytime with additional questions or concerns.   Mia Luz, MS, [facility] Certified Genetic Counselor .[email or web address]

## 2018-07-23 ENCOUNTER — Encounter (HOSPITAL_BASED_OUTPATIENT_CLINIC_OR_DEPARTMENT_OTHER): Payer: Medicare Other | Attending: Internal Medicine

## 2018-07-23 ENCOUNTER — Encounter: Payer: Self-pay | Admitting: Nurse Practitioner

## 2018-07-23 ENCOUNTER — Ambulatory Visit (INDEPENDENT_AMBULATORY_CARE_PROVIDER_SITE_OTHER): Payer: Medicare Other | Admitting: Nurse Practitioner

## 2018-07-23 ENCOUNTER — Other Ambulatory Visit: Payer: Self-pay | Admitting: Cardiovascular Disease

## 2018-07-23 VITALS — BP 110/60 | Ht 63.0 in | Wt 127.8 lb

## 2018-07-23 DIAGNOSIS — I5022 Chronic systolic (congestive) heart failure: Secondary | ICD-10-CM | POA: Diagnosis not present

## 2018-07-23 DIAGNOSIS — I509 Heart failure, unspecified: Secondary | ICD-10-CM | POA: Diagnosis not present

## 2018-07-23 DIAGNOSIS — S8012XA Contusion of left lower leg, initial encounter: Secondary | ICD-10-CM | POA: Diagnosis not present

## 2018-07-23 DIAGNOSIS — Z9221 Personal history of antineoplastic chemotherapy: Secondary | ICD-10-CM | POA: Insufficient documentation

## 2018-07-23 DIAGNOSIS — Z96653 Presence of artificial knee joint, bilateral: Secondary | ICD-10-CM | POA: Diagnosis not present

## 2018-07-23 DIAGNOSIS — Z923 Personal history of irradiation: Secondary | ICD-10-CM | POA: Insufficient documentation

## 2018-07-23 DIAGNOSIS — Z85828 Personal history of other malignant neoplasm of skin: Secondary | ICD-10-CM | POA: Diagnosis not present

## 2018-07-23 DIAGNOSIS — W272XXA Contact with scissors, initial encounter: Secondary | ICD-10-CM | POA: Insufficient documentation

## 2018-07-23 DIAGNOSIS — J449 Chronic obstructive pulmonary disease, unspecified: Secondary | ICD-10-CM | POA: Diagnosis not present

## 2018-07-23 DIAGNOSIS — X58XXXA Exposure to other specified factors, initial encounter: Secondary | ICD-10-CM | POA: Diagnosis not present

## 2018-07-23 DIAGNOSIS — I110 Hypertensive heart disease with heart failure: Secondary | ICD-10-CM | POA: Diagnosis not present

## 2018-07-23 DIAGNOSIS — Z853 Personal history of malignant neoplasm of breast: Secondary | ICD-10-CM | POA: Insufficient documentation

## 2018-07-23 DIAGNOSIS — Z87891 Personal history of nicotine dependence: Secondary | ICD-10-CM | POA: Diagnosis not present

## 2018-07-23 DIAGNOSIS — S81811A Laceration without foreign body, right lower leg, initial encounter: Secondary | ICD-10-CM | POA: Insufficient documentation

## 2018-07-23 DIAGNOSIS — I87331 Chronic venous hypertension (idiopathic) with ulcer and inflammation of right lower extremity: Secondary | ICD-10-CM | POA: Diagnosis not present

## 2018-07-23 DIAGNOSIS — I87311 Chronic venous hypertension (idiopathic) with ulcer of right lower extremity: Secondary | ICD-10-CM | POA: Diagnosis not present

## 2018-07-23 DIAGNOSIS — L97212 Non-pressure chronic ulcer of right calf with fat layer exposed: Secondary | ICD-10-CM | POA: Insufficient documentation

## 2018-07-23 LAB — FECAL LACTOFERRIN, QUANT
Fecal Lactoferrin: NEGATIVE
MICRO NUMBER: 91439677
SPECIMEN QUALITY:: ADEQUATE

## 2018-07-23 MED ORDER — METOLAZONE 2.5 MG PO TABS: 2.5000 mg | ORAL_TABLET | Freq: Every day | ORAL | 3 refills | Status: DC

## 2018-07-23 MED ORDER — POTASSIUM CHLORIDE CRYS ER 20 MEQ PO TBCR: 20.0000 meq | EXTENDED_RELEASE_TABLET | Freq: Every day | ORAL | 3 refills | Status: DC

## 2018-07-23 NOTE — Patient Instructions (Addendum)
We will be checking the following labs today - BMET and BNP   Medication Instructions:    Continue with your current medicines. BUT   STAY OFF THE ENTRESTO  DO NOT PICK UP THE RX OF METALOZONE - I AM SENDING IN A NEW RX TODAY FOR A LOWER DOSE  STAY ON 40 MG OF TORSEMIDE DAILY- TAKE 2.5 OF METALOZONE 30 MINUTES PRIOR TO YOUR TORSEMIDE FOR THE NEXT 3 DAYS - WEDNESDAY/THURSDAY/FRIDAY - I HAVE SENT THIS TO YOUR PHARMACY  I AM GIVING YOU POTASSIUM TO TAKE FOR JUST THE NEXT 3 DAYS - THIS IS AT THE PHARMACY TOO   If you need a refill on your cardiac medications before your next appointment, please call your pharmacy.     Testing/Procedures To Be Arranged:  N/A  Follow-Up:   See me next week.     At [facility], you and your health needs are our priority.  As part of our continuing mission to provide you with exceptional heart care, we have created designated Provider Care Teams.  These Care Teams include your primary Cardiologist (physician) and Advanced Practice Providers (APPs -  Physician Assistants and Nurse Practitioners) who all work together to provide you with the care you need, when you need it.  Special Instructions:  . You have to restrict your salt  Call the [place] office at [phone] if you have any questions, problems or concerns.

## 2018-07-23 NOTE — Telephone Encounter (Signed)
Pt's medication was resent to pt's pharmacy as requested. Confirmation received.  °

## 2018-07-23 NOTE — Progress Notes (Signed)
CARDIOLOGY OFFICE NOTE  Date:  07/23/2018    Mia Contreras Date of Birth: March 02, 1929 Medical Record #[id number]  PCP:  Mia Anger, MD  Cardiologist:  Mia Contreras   Chief Complaint  Patient presents with  . Congestive Heart Failure    Follow up visit - seen for Dr. Johnsie Contreras    History of Present Illness: Mia Contreras is a 84 y.o. female who presents today for a work in visit. Seen for Dr. Johnsie Contreras.   She has a history of NICM with EF of 45-50% on prior echo 2017 (previously 30-35% in 2016),hypertension, hyperlipidemia, CKD stage III, breast cancer and persistent atrial fibrillation on a apixaban.   She was admitted to the hospital 06/18/2018 for evaluation of weight gain and lower extremity edema despite extra torsemide at home.  She had had a recurrence of stage I breast cancer and underwent lumpectomy 06/11/2018 and following her surgery people were bringing food over to her home.  She may have been eating more salt than she usually does.  BNP was 1407 and chest x-ray showed mild vascular congestion.  Echo on 06/19/2018 showed EF down to 20-25% with dyskinesis of the anteroseptal myocardium.  She had no chest pain and does have CKD (sees Dr. Justin Contreras) so she was not felt to be a candidate for cardiac catheterization to evaluate her reduced EF.  She was diuresed with IV Lasix with improvement, weight loss of about 10 pounds and discharged home on her usual torsemide.  She was deemed to also not be a candidate for the addition of ACE inhibitor, ARB or ARNI.  Noted to basically have biventricular failure and complicated by her severe CKD. Blood pressure was low in the 40J-811 systolic, therefore she would not be able to tolerate further afterload reduction. It was the patient's opinion that the IV NS fluids given with surgery caused her volume overload.   She was seen for a post hospital visit by Mia Perch, NP about a month ago - she was improved. She did have a wound on her leg.  Has had a few phone calls since - has had weight gain - had extra salt - had diarrhea and continued issues with her leg wound.   Comes in today. Here alone. Frustrated - says she is getting the "run around". Confused about what all has transpired in my opinion. Seems mixed up about her medicines. Lives alone. No children. Brother lives in [place]. Apparently pretty independent. Tells me she was told this fluid "was not her heart and was her veins". ???. Her record notes that she is not a candidate for ACE/ARB/ARNI but yet she is on Entresto. But this apparently caused diarrhea - now NOT taking for the past 5 days - and too expensive for her also. Now with more swelling over the past week. Says she is restricting her salt better now but does mention eating crackers and peanuts. Confused about "all the doctors she sees". Going to the wound clinic later today - has had this wound for several months. No chest pain. She has chronic shortness of breath. May be worse. Seems more upset about the swelling - can't wear certain shoes.   Past Medical History:  Diagnosis Date  . AKI (acute kidney injury) ([place]) 01/2016  . ANEMIA-NOS 08/20/2007  . ANXIETY 03/16/2007  . BREAST CANCER, HX OF 03/16/2007  . CARPAL TUNNEL SYNDROME, RIGHT 01/11/2008  . Cataract    floaters  . CELLULITIS, LEG, RIGHT 08/20/2007  .  CHF (congestive heart failure) ([place])   . Collagenous colitis   . Congestive heart failure with right ventricular systolic dysfunction ([place]) 06/19/2018  . COPD (chronic obstructive pulmonary disease) ([place])   . DIVERTICULOSIS, COLON 08/20/2007  . Dyspnea    on exertion  . Dysrhythmia    atrial fib  . Family history of brain tumor   . Family history of lung cancer   . Family history of lymphoma   . Family history of prostate cancer   . Family history of skin cancer   . GOITER, MULTINODULAR 05/06/2010   Dr [name]  . HYPERLIPIDEMIA 08/20/2007  . HYPERTENSION 03/16/2007  . HYPERTHYROIDISM 05/23/2010  . IBS  (irritable bowel syndrome)   . OSTEOARTHRITIS 03/16/2007  . OSTEOPOROSIS 08/20/2007  . Personal history of breast cancer 07/01/2018  . Pneumonia 07/2011   took abx for several weeks  . PREMATURE ATRIAL CONTRACTIONS 09/21/2010  . SCOLIOSIS 01/11/2008  . Severe tricuspid regurgitation 06/19/2018  . SKIN CANCER, HX OF 11/12/2008    R cheek 2010, L Cheek 2011 Dr. Tonia Brooms  . Thyroid nodule   . Uterine cancer ([place])   . VENOUS INSUFFICIENCY 09/05/2007    Past Surgical History:  Procedure Laterality Date  . ABDOMINAL HYSTERECTOMY    . APPENDECTOMY  1972  . BREAST LUMPECTOMY Left 2001  . BREAST LUMPECTOMY WITH RADIOACTIVE SEED LOCALIZATION Left 06/11/2018   Procedure: LEFT BREAST LUMPECTOMY WITH BRACKETED RADIOACTIVE SEED LOCALIZATION;  Surgeon: Fanny Skates, MD;  Location: [facility];  Service: General;  Laterality: Left;  . CARPAL TUNNEL RELEASE    . CATARACT EXTRACTION    . EYE SURGERY     cataract ext  . HERNIA REPAIR    . MASTECTOMY, PARTIAL Left   . SKIN CANCER EXCISION     face-Dr. Bing Plume  . TOTAL KNEE ARTHROPLASTY    . TOTAL KNEE ARTHROPLASTY Right 2010   Dr Para March  . TOTAL KNEE ARTHROPLASTY  10/30/2011   Procedure: TOTAL KNEE ARTHROPLASTY;  Surgeon: Lorn Junes, MD;  Location: [facility];  Service: Orthopedics;  Laterality: Left;     . XRT     chemo, surgery     Medications: Current Meds  Medication Sig  . ALPRAZolam (XANAX) 0.5 MG tablet Take 1 tablet (0.5 mg total) by mouth 2 (two) times daily as needed for anxiety.  Marland Kitchen apixaban (ELIQUIS) 2.5 MG TABS tablet Take 1 tablet (2.5 mg total) by mouth 2 (two) times daily.  . carvedilol (COREG) 3.125 MG tablet Take 1 tablet (3.125 mg total) by mouth 2 (two) times daily with a meal.  . coal tar (NEUTROGENA T-GEL) 0.5 % shampoo Apply topically 2 (two) times a week.   Marland Kitchen ketoconazole (NIZORAL) 2 % shampoo Apply 1 application topically 2 (two) times a week.  Vladimir Faster Glycol-Propyl Glycol (SYSTANE ULTRA PF OP) Place 2 drops into both eyes  as needed (for dry eyes).  . torsemide (DEMADEX) 20 MG tablet TAKE TWO TABLETS BY MOUTH DAILY  . triamcinolone ointment (KENALOG) 0.1 % Apply 1 application topically 2 (two) times daily. Use qd -bid  . vitamin C (ASCORBIC ACID) 500 MG tablet Take 1 tablet (500 mg total) by mouth daily.  . [DISCONTINUED] azithromycin (ZITHROMAX Z-PAK) 250 MG tablet As directed  . [DISCONTINUED] metolazone (ZAROXOLYN) 5 MG tablet Take 1 tablet (5 mg total) by mouth every Monday, Wednesday, and Friday. In the mornings  . [DISCONTINUED] sacubitril-valsartan (ENTRESTO) 24-26 MG Take 1 tablet by mouth 2 (two) times daily.     Allergies:  Allergies  Allergen Reactions  . Cefuroxime Diarrhea  . Celecoxib Nausea Only  . Deltasone [Prednisone] Swelling    Oral deltasone is causing swelling (can use Depo-Medrol)  . Ranitidine Other (See Comments)    bloating  . Spironolactone Other (See Comments)    High K  . Tapazole [Methimazole] Rash  . Tramadol Hcl Nausea Only  . Doxycycline Nausea Only    Social History: The patient  reports that she quit smoking about 37 years ago. She has never used smokeless tobacco. She reports that she drank alcohol. She reports that she does not use drugs.   Family History: The patient's family history includes Alcohol abuse in her father; Alzheimer's disease in her mother; Bladder Cancer in her brother; Brain cancer (age of onset: 43) in her maternal uncle; Brain cancer (age of onset: 28) in her maternal aunt; Dementia in her mother; Diabetes in her other; Heart attack in her father, maternal grandfather, and maternal grandmother; Heart disease in her mother; Hypertension in her mother; Lung cancer in her paternal uncle; Lymphoma (age of onset: 55) in her other; Mental retardation in her mother; Prostate cancer in her brother; Skin cancer in her other.   Review of Systems: Please see the history of present illness.   Otherwise, the review of systems is positive for none.   All other  systems are reviewed and negative.   Physical Exam: VS:  BP 110/60 (BP Location: Left Arm, Patient Position: Sitting, Cuff Size: Normal)   Ht 5\' 3"  (1.6 m)   Wt 127 lb 12.8 oz (58 kg)   SpO2 (!) 84% Comment: sitting 90  BMI 22.64 kg/m  .  BMI Body mass index is 22.64 kg/m.  Wt Readings from Last 3 Encounters:  07/23/18 127 lb 12.8 oz (58 kg)  07/17/18 127 lb (57.6 kg)  07/02/18 120 lb (54.4 kg)    General: Pleasant. Well developed, well nourished and in no acute distress.   HEENT: Normal.  Neck: Supple, + JVD  Cardiac: Irregular irregular rhythm. Her rate is in the 90's on my exam.  1-2+ edema.  Respiratory:  Lungs are fairly clear to auscultation bilaterally with normal work of breathing.  GI: Soft and nontender.  MS: No deformity or atrophy. Gait and ROM intact.  Skin: Warm and dry. Color is normal.  Neuro:  Strength and sensation are intact and no gross focal deficits noted.  Psych: Alert, appropriate and with normal affect.   LABORATORY DATA:  EKG:  EKG is not ordered today.  Lab Results  Component Value Date   WBC 7.1 06/18/2018   HGB 13.3 06/18/2018   HCT 41.9 06/18/2018   PLT 197 06/18/2018   GLUCOSE 76 07/17/2018   CHOL 130 11/04/2015   TRIG 77.0 11/04/2015   HDL 54.50 11/04/2015   LDLCALC 60 11/04/2015   ALT 27 06/17/2018   AST 33 06/17/2018   NA 140 07/17/2018   K 4.2 07/17/2018   CL 98 07/17/2018   CREATININE 1.45 (H) 07/17/2018   BUN 34 (H) 07/17/2018   CO2 34 (H) 07/17/2018   TSH 1.82 05/06/2018   INR 0.95 10/25/2011   HGBA1C 5.8 05/06/2018   MICROALBUR 0.6 03/23/2011     BNP (last 3 results) Recent Labs    06/18/18 1905  BNP 1,407.4*    ProBNP (last 3 results) Recent Labs    06/17/18 1544  PROBNP 1,573.0*     Other Studies Reviewed Today:  Echocardiogram 06/19/18: Study Conclusions - Left ventricle: The cavity size  was normal. Wall thickness was normal. Systolic function was severely reduced. The estimated ejection  fraction was in the range of 20% to 25%. Dyskinesis of the anteroseptal myocardium. - Mitral valve: There was mild regurgitation. - Left atrium: The atrium was mildly dilated. - Right ventricle: The cavity size was severely dilated. Systolic function was moderately reduced. - Right atrium: The atrium was severely dilated. - Tricuspid valve: There was moderate-severe regurgitation. - Pulmonary arteries: Systolic pressure was moderately increased. PA peak pressure: 58 mm Hg (S).    Assessment/Plan:  1. Acute on chronic CHF - reduced EF by most recent echo - has biventricular failure with valvular heart disease - complicated by CKD. Already deemed to not be a candidate for ACE/ARB/ARNI - no longer on Entresto due to diarrhea and will not be able to afford. BP soft which limits medical therapy. Overall situation tenuous and challenging - needs salt restriction - reiterated numerous times during the visit today. Adding Zaroxolyn 2.5 mg to take for the next 3 days and then STOP. Will use potassium for 3 days as well and then stop. May have to use bi-weekly.  Lab today. Needs close follow up. Options appear limited to me.   2. Permanent AF - on Eliquis. Rate is fair today. Unclear if we really have medication compliance.   3. CKD - stage 3 - followed by nephrology.   4. Diarrhea - ?from [facility] - this has improved  5. COPD - oxygen sats up to 90% after sitting   6. Advanced age - options appear limited to me - no real family support - may need to consider palliative care. Would focus on symptoms relief.   7. HTN - BP soft - no changes made today.   8. Leg wound - going to the wound clinic later today.   Current medicines are reviewed with the patient today.  The patient does not have concerns regarding medicines other than what has been noted above.  The following changes have been made:  See above.  Labs/ tests ordered today include:    Orders Placed This Encounter  Procedures   . Basic metabolic panel  . Pro b natriuretic peptide (BNP)     Disposition:   FU with me next week. Recheck lab on return.   Patient is agreeable to this plan and will call if any problems develop in the interim.   SignedTruitt Merle, NP  07/23/2018 12:31 PM  [facility] [address] [place], [place]  [address] Phone: [phone] Fax: [phone]

## 2018-07-23 NOTE — Telephone Encounter (Signed)
 *  STAT* If patient is at the pharmacy, call can be transferred to refill team.   1. Which medications need to be refilled? (please list name of each medication and dose if known) metolazone (ZAROXOLYN) 2.5 MG tablet  2. Which pharmacy/location (including street and city if local pharmacy) is medication to be sent to?M[facility]re[facility]  3. Do they need a 30 day or 90 day supply?30  Pharmacy did not get the new script today. They are having issues with their E-script and stated a verbal order would be fine.

## 2018-07-24 LAB — BASIC METABOLIC PANEL
BUN/Creatinine Ratio: 20 (ref 12–28)
BUN: 28 mg/dL — ABNORMAL HIGH (ref 8–27)
CO2: 29 mmol/L (ref 20–29)
Calcium: 10.1 mg/dL (ref 8.7–10.3)
Chloride: 94 mmol/L — ABNORMAL LOW (ref 96–106)
Creatinine, Ser: 1.4 mg/dL — ABNORMAL HIGH (ref 0.57–1.00)
GFR calc Af Amer: 39 mL/min/{1.73_m2} — ABNORMAL LOW (ref >59)
GFR calc non Af Amer: 34 mL/min/{1.73_m2} — ABNORMAL LOW (ref >59)
Glucose: 89 mg/dL (ref 65–99)
Potassium: 4.1 mmol/L (ref 3.5–5.2)
Sodium: 140 mmol/L (ref 134–144)

## 2018-07-24 LAB — PRO B NATRIURETIC PEPTIDE: NT-Pro BNP: 8739 pg/mL — ABNORMAL HIGH (ref 0–738)

## 2018-07-25 NOTE — Progress Notes (Signed)
Noted  

## 2018-07-26 DIAGNOSIS — I4891 Unspecified atrial fibrillation: Secondary | ICD-10-CM | POA: Diagnosis not present

## 2018-07-26 DIAGNOSIS — I87311 Chronic venous hypertension (idiopathic) with ulcer of right lower extremity: Secondary | ICD-10-CM | POA: Diagnosis not present

## 2018-07-26 DIAGNOSIS — I890 Lymphedema, not elsewhere classified: Secondary | ICD-10-CM | POA: Diagnosis not present

## 2018-07-26 DIAGNOSIS — F419 Anxiety disorder, unspecified: Secondary | ICD-10-CM | POA: Diagnosis not present

## 2018-07-26 DIAGNOSIS — Z853 Personal history of malignant neoplasm of breast: Secondary | ICD-10-CM | POA: Diagnosis not present

## 2018-07-26 DIAGNOSIS — I5082 Biventricular heart failure: Secondary | ICD-10-CM | POA: Diagnosis not present

## 2018-07-26 DIAGNOSIS — E785 Hyperlipidemia, unspecified: Secondary | ICD-10-CM | POA: Diagnosis not present

## 2018-07-26 DIAGNOSIS — Z7901 Long term (current) use of anticoagulants: Secondary | ICD-10-CM | POA: Diagnosis not present

## 2018-07-29 ENCOUNTER — Telehealth (HOSPITAL_COMMUNITY): Payer: Self-pay | Admitting: Pharmacist

## 2018-07-29 DIAGNOSIS — I5082 Biventricular heart failure: Secondary | ICD-10-CM | POA: Diagnosis not present

## 2018-07-29 DIAGNOSIS — I4891 Unspecified atrial fibrillation: Secondary | ICD-10-CM | POA: Diagnosis not present

## 2018-07-29 DIAGNOSIS — F419 Anxiety disorder, unspecified: Secondary | ICD-10-CM | POA: Diagnosis not present

## 2018-07-29 DIAGNOSIS — I890 Lymphedema, not elsewhere classified: Secondary | ICD-10-CM | POA: Diagnosis not present

## 2018-07-29 DIAGNOSIS — E785 Hyperlipidemia, unspecified: Secondary | ICD-10-CM | POA: Diagnosis not present

## 2018-07-29 DIAGNOSIS — I87311 Chronic venous hypertension (idiopathic) with ulcer of right lower extremity: Secondary | ICD-10-CM | POA: Diagnosis not present

## 2018-07-29 NOTE — Telephone Encounter (Signed)
Patient has filled out and sent her portion of the [employer] patient assistance application for Eliquis. I will fax the physician portion today. She also knows that she needs to spend 3% of her annual income in 2020 before she is eligible and will send in proof of that when she meets it.   Ruta Hinds. Velva Harman, PharmD, BCPS, CPP Clinical Pharmacist Phone: [phone] 07/29/2018 12:01 PM

## 2018-07-30 DIAGNOSIS — Z87891 Personal history of nicotine dependence: Secondary | ICD-10-CM | POA: Diagnosis not present

## 2018-07-30 DIAGNOSIS — S8012XA Contusion of left lower leg, initial encounter: Secondary | ICD-10-CM | POA: Diagnosis not present

## 2018-07-30 DIAGNOSIS — L97212 Non-pressure chronic ulcer of right calf with fat layer exposed: Secondary | ICD-10-CM | POA: Diagnosis not present

## 2018-07-30 DIAGNOSIS — Z96653 Presence of artificial knee joint, bilateral: Secondary | ICD-10-CM | POA: Diagnosis not present

## 2018-07-30 DIAGNOSIS — I87331 Chronic venous hypertension (idiopathic) with ulcer and inflammation of right lower extremity: Secondary | ICD-10-CM | POA: Diagnosis not present

## 2018-07-30 DIAGNOSIS — I87311 Chronic venous hypertension (idiopathic) with ulcer of right lower extremity: Secondary | ICD-10-CM | POA: Diagnosis not present

## 2018-07-30 DIAGNOSIS — S81811A Laceration without foreign body, right lower leg, initial encounter: Secondary | ICD-10-CM | POA: Diagnosis not present

## 2018-07-30 NOTE — Progress Notes (Signed)
CARDIOLOGY OFFICE NOTE  Date:  07/31/2018    Mia Contreras Date of Birth: Feb 13, 1929 Medical Record #[id number]  PCP:  Cassandria Anger, MD  Cardiologist:  Gillian Shields    Chief Complaint  Patient presents with  . Congestive Heart Failure    1 week check - seen for Dr. Johnsie Cancel    History of Present Illness: Mia Contreras is a 84 y.o. female who presents today for a one week check. Seen for Dr. Johnsie Cancel.   She has a history of NICM with EF of 45-50% on prior echo 2017 (previously 30-35% in 2016),hypertension, hyperlipidemia, CKD stage III, breast cancer and persistent atrial fibrillation on a apixaban.  She was admitted to the hospital 06/18/2018 for evaluation of weight gain and lower extremity edema despite extra torsemide at home. She had had a recurrence of stage I breast cancer and underwent lumpectomy 06/11/2018 and following her surgery people were bringing food over to her home. She may have been eating more salt than she usually does. BNP was 1407 and chest x-ray showed mild vascular congestion.Echo on 06/19/2018 showed EF down to 20-25%with dyskinesis of the anteroseptal myocardium.She had no chest pain and does have CKD (sees Dr. Justin Mend) so she was not felt to be a candidate for cardiac catheterization to evaluate her reduced EF. She was diuresed with IV Lasix with improvement,weight loss of about 10 pounds and discharged home on her usual torsemide.She was deemed to also not be a candidate for the addition of ACE inhibitor, ARB or ARNI.Noted to basically have biventricular failure and complicated by her severe CKD. Blood pressure was low in the 89F-810 systolic, therefore she would not be able to tolerate further afterload reduction. It was the patient's opinion that the IV NS fluids given with surgery caused her volume overload.   She was seen for a post hospital visit by Daune Perch, NP a little over a month ago - she was improved at that  time.  She did have a wound on her leg. Had a few phone calls since - had weight gain - had extra salt - had diarrhea and continued issues with her leg wound. I then saw her last week - very confused about her condition/health. Seemed mixed up on her medicines to me. Lots of swelling. Weight was up. Poor social situation with no real support/no family/lives alone. Deemed to not be a candidate for ACE/ARB/ARNI but was on Entresto - potassium got high - this was stopped. She had lots of swelling on exam and I gave her a 3 day course of Zaroxolyn. I started an end of life conversation as well.   Comes in today. Here with a friend - [name]. [name] is doing better - weight is down almost 20 pounds. Swelling resolved. She has an Haematologist in place from the wound clinic. Breathing is some better. She is doing better with salt restriction. She has one floor living - no family - just a brother who is in [place] in an assisted living facility with CHF himself. Has not really thought of end of life planning. Getting more difficult with walking due to profound scoliosis.   Past Medical History:  Diagnosis Date  . AKI (acute kidney injury) ([place]) 01/2016  . ANEMIA-NOS 08/20/2007  . ANXIETY 03/16/2007  . BREAST CANCER, HX OF 03/16/2007  . CARPAL TUNNEL SYNDROME, RIGHT 01/11/2008  . Cataract    floaters  . CELLULITIS, LEG, RIGHT 08/20/2007  . CHF (congestive  heart failure) ([place])   . Collagenous colitis   . Congestive heart failure with right ventricular systolic dysfunction ([place]) 06/19/2018  . COPD (chronic obstructive pulmonary disease) ([place])   . DIVERTICULOSIS, COLON 08/20/2007  . Dyspnea    on exertion  . Dysrhythmia    atrial fib  . Family history of brain tumor   . Family history of lung cancer   . Family history of lymphoma   . Family history of prostate cancer   . Family history of skin cancer   . GOITER, MULTINODULAR 05/06/2010   Dr [name]  . HYPERLIPIDEMIA 08/20/2007  . HYPERTENSION 03/16/2007    . HYPERTHYROIDISM 05/23/2010  . IBS (irritable bowel syndrome)   . OSTEOARTHRITIS 03/16/2007  . OSTEOPOROSIS 08/20/2007  . Personal history of breast cancer 07/01/2018  . Pneumonia 07/2011   took abx for several weeks  . PREMATURE ATRIAL CONTRACTIONS 09/21/2010  . SCOLIOSIS 01/11/2008  . Severe tricuspid regurgitation 06/19/2018  . SKIN CANCER, HX OF 11/12/2008    R cheek 2010, L Cheek 2011 Dr. Tonia Brooms  . Thyroid nodule   . Uterine cancer ([place])   . VENOUS INSUFFICIENCY 09/05/2007    Past Surgical History:  Procedure Laterality Date  . ABDOMINAL HYSTERECTOMY    . APPENDECTOMY  1972  . BREAST LUMPECTOMY Left 2001  . BREAST LUMPECTOMY WITH RADIOACTIVE SEED LOCALIZATION Left 06/11/2018   Procedure: LEFT BREAST LUMPECTOMY WITH BRACKETED RADIOACTIVE SEED LOCALIZATION;  Surgeon: Fanny Skates, MD;  Location: [facility];  Service: General;  Laterality: Left;  . CARPAL TUNNEL RELEASE    . CATARACT EXTRACTION    . EYE SURGERY     cataract ext  . HERNIA REPAIR    . MASTECTOMY, PARTIAL Left   . SKIN CANCER EXCISION     face-Dr. Bing Plume  . TOTAL KNEE ARTHROPLASTY    . TOTAL KNEE ARTHROPLASTY Right 2010   Dr Para March  . TOTAL KNEE ARTHROPLASTY  10/30/2011   Procedure: TOTAL KNEE ARTHROPLASTY;  Surgeon: Lorn Junes, MD;  Location: [facility];  Service: Orthopedics;  Laterality: Left;     . XRT     chemo, surgery     Medications: Current Meds  Medication Sig  . ALPRAZolam (XANAX) 0.5 MG tablet Take 1 tablet (0.5 mg total) by mouth 2 (two) times daily as needed for anxiety.  Marland Kitchen apixaban (ELIQUIS) 2.5 MG TABS tablet Take 1 tablet (2.5 mg total) by mouth 2 (two) times daily.  . carvedilol (COREG) 3.125 MG tablet Take 1 tablet (3.125 mg total) by mouth 2 (two) times daily with a meal.  . coal tar (NEUTROGENA T-GEL) 0.5 % shampoo Apply topically 2 (two) times a week.   Marland Kitchen ketoconazole (NIZORAL) 2 % shampoo Apply 1 application topically 2 (two) times a week.  Vladimir Faster Glycol-Propyl Glycol (SYSTANE  ULTRA PF OP) Place 2 drops into both eyes as needed (for dry eyes).  . torsemide (DEMADEX) 20 MG tablet TAKE TWO TABLETS BY MOUTH DAILY  . triamcinolone ointment (KENALOG) 0.1 % Apply 1 application topically 2 (two) times daily. Use qd -bid  . vitamin C (ASCORBIC ACID) 500 MG tablet Take 1 tablet (500 mg total) by mouth daily.     Allergies: Allergies  Allergen Reactions  . Cefuroxime Diarrhea  . Celecoxib Nausea Only  . Deltasone [Prednisone] Swelling    Oral deltasone is causing swelling (can use Depo-Medrol)  . Ranitidine Other (See Comments)    bloating  . Spironolactone Other (See Comments)    High K  .  Tapazole [Methimazole] Rash  . Tramadol Hcl Nausea Only    Social History: The patient  reports that she quit smoking about 37 years ago. She has never used smokeless tobacco. She reports that she drank alcohol. She reports that she does not use drugs.   Family History: The patient's family history includes Alcohol abuse in her father; Alzheimer's disease in her mother; Bladder Cancer in her brother; Brain cancer (age of onset: 27) in her maternal uncle; Brain cancer (age of onset: 43) in her maternal aunt; Dementia in her mother; Diabetes in her other; Heart attack in her father, maternal grandfather, and maternal grandmother; Heart disease in her mother; Hypertension in her mother; Lung cancer in her paternal uncle; Lymphoma (age of onset: 39) in her other; Mental retardation in her mother; Prostate cancer in her brother; Skin cancer in her other.   Review of Systems: Please see the history of present illness.   Otherwise, the review of systems is positive for none.   All other systems are reviewed and negative.   Physical Exam: VS:  BP 106/64   Pulse 90   Ht 5\' 3"  (1.6 m)   Wt 109 lb 12.8 oz (49.8 kg)   SpO2 97%   BMI 19.45 kg/m  .  BMI Body mass index is 19.45 kg/m.  Wt Readings from Last 3 Encounters:  07/31/18 109 lb 12.8 oz (49.8 kg)  07/23/18 127 lb 12.8 oz (58  kg)  07/17/18 127 lb (57.6 kg)    General: Elderly. Alert and in no acute distress. Her weight is down considerably.   HEENT: Normal.  Neck: Supple, no JVD, carotid bruits, or masses noted.  Cardiac: Irregular irregular rhythm. May have S3. No real edema today - very much improved.  Respiratory:  Lungs are clear to auscultation bilaterally with normal work of breathing.  GI: Soft and nontender.  MS: No deformity or atrophy. Gait and ROM intact. Using a walker.  Skin: Warm and dry. Color is normal.  Neuro:  Strength and sensation are intact and no gross focal deficits noted.  Psych: Alert, appropriate and with normal affect.   LABORATORY DATA:  EKG:  EKG is not ordered today.  Lab Results  Component Value Date   WBC 7.1 06/18/2018   HGB 13.3 06/18/2018   HCT 41.9 06/18/2018   PLT 197 06/18/2018   GLUCOSE 89 07/23/2018   CHOL 130 11/04/2015   TRIG 77.0 11/04/2015   HDL 54.50 11/04/2015   LDLCALC 60 11/04/2015   ALT 27 06/17/2018   AST 33 06/17/2018   NA 140 07/23/2018   K 4.1 07/23/2018   CL 94 (L) 07/23/2018   CREATININE 1.40 (H) 07/23/2018   BUN 28 (H) 07/23/2018   CO2 29 07/23/2018   TSH 1.82 05/06/2018   INR 0.95 10/25/2011   HGBA1C 5.8 05/06/2018   MICROALBUR 0.6 03/23/2011     BNP (last 3 results) Recent Labs    06/18/18 1905  BNP 1,407.4*    ProBNP (last 3 results) Recent Labs    06/17/18 1544 07/23/18 1305  PROBNP 1,573.0* 8,739*     Other Studies Reviewed Today:  Echocardiogram 06/19/18: Study Conclusions - Left ventricle: The cavity size was normal. Wall thickness was normal. Systolic function was severely reduced. The estimated ejection fraction was in the range of 20% to 25%. Dyskinesis of the anteroseptal myocardium. - Mitral valve: There was mild regurgitation. - Left atrium: The atrium was mildly dilated. - Right ventricle: The cavity size was severely dilated. Systolic  function was moderately reduced. - Right atrium: The  atrium was severely dilated. - Tricuspid valve: There was moderate-severe regurgitation. - Pulmonary arteries: Systolic pressure was moderately increased. PA peak pressure: 58 mm Hg (S).    Assessment/Plan:  1. Acute on chronic CHF - reduced EF by most recent echo - has biventricular failure with valvular heart disease - complicated by CKD. Already deemed to not be a candidate for ACE/ARB/ARNI - no longer on Entresto due to diarrhea and will not be able to afford. BP remains soft which limits medical therapy. Overall situation remains tenuous and challenging - this is reiterated again today. She did improve clinically with Zaroxolyn. Recheck her lab today. Will need close follow up going forward. Salt restriction imperative - she is doing better with this.   2. Permanent AF - on Eliquis. Rate is fair today. May try to increase Coreg going forward.   3. CKD - stage 3 - followed by nephrology. Rechecking her lab today.   4. Diarrhea - ?from [facility] - this has improved - not reported today  5. COPD - chronic degree of shortness of breath.   6. Advanced age - options appear limited to me - no real family support - we have discussed this today.   7. HTN - BP too soft.   8. Leg wound - was sent to the wound clinic. Now with unna boot in place.    Current medicines are reviewed with the patient today.  The patient does not have concerns regarding medicines other than what has been noted above.  The following changes have been made:  See above.  Labs/ tests ordered today include:    Orders Placed This Encounter  Procedures  . Basic metabolic panel  . Pro b natriuretic peptide (BNP)     Disposition:   FU with Dr. Johnsie Cancel in January - I am happy to see back as well.   Patient is agreeable to this plan and will call if any problems develop in the interim.   SignedTruitt Merle, NP  07/31/2018 11:11 AM  [facility] [address]  [place], [place]  [address] Phone: [phone] Fax: [phone]

## 2018-07-31 ENCOUNTER — Telehealth: Payer: Self-pay | Admitting: Cardiovascular Disease

## 2018-07-31 ENCOUNTER — Encounter: Payer: Self-pay | Admitting: Nurse Practitioner

## 2018-07-31 ENCOUNTER — Ambulatory Visit (INDEPENDENT_AMBULATORY_CARE_PROVIDER_SITE_OTHER): Payer: Medicare Other | Admitting: Nurse Practitioner

## 2018-07-31 VITALS — BP 106/64 | HR 90 | Ht 63.0 in | Wt 109.8 lb

## 2018-07-31 DIAGNOSIS — I4821 Permanent atrial fibrillation: Secondary | ICD-10-CM | POA: Diagnosis not present

## 2018-07-31 DIAGNOSIS — I5022 Chronic systolic (congestive) heart failure: Secondary | ICD-10-CM

## 2018-07-31 DIAGNOSIS — N183 Chronic kidney disease, stage 3 unspecified: Secondary | ICD-10-CM

## 2018-07-31 LAB — BASIC METABOLIC PANEL
BUN/Creatinine Ratio: 27 (ref 12–28)
BUN: 44 mg/dL — ABNORMAL HIGH (ref 8–27)
CO2: 40 mmol/L — ABNORMAL HIGH (ref 20–29)
Calcium: 10.6 mg/dL — ABNORMAL HIGH (ref 8.7–10.3)
Chloride: 80 mmol/L — ABNORMAL LOW (ref 96–106)
Creatinine, Ser: 1.63 mg/dL — ABNORMAL HIGH (ref 0.57–1.00)
GFR calc Af Amer: 32 mL/min/{1.73_m2} — ABNORMAL LOW (ref >59)
GFR calc non Af Amer: 28 mL/min/{1.73_m2} — ABNORMAL LOW (ref >59)
Glucose: 72 mg/dL (ref 65–99)
Potassium: 3.3 mmol/L — ABNORMAL LOW (ref 3.5–5.2)
Sodium: 142 mmol/L (ref 134–144)

## 2018-07-31 LAB — PRO B NATRIURETIC PEPTIDE: NT-Pro BNP: 7046 pg/mL — ABNORMAL HIGH (ref 0–738)

## 2018-07-31 NOTE — Telephone Encounter (Signed)
Referred Rebecca with [facility] to contact patient's PCP for orders.

## 2018-07-31 NOTE — Patient Instructions (Addendum)
We will be checking the following labs today - BMET & BNP  If you have labs (blood work) drawn today and your tests are completely normal, you will receive your results only by: Marland Kitchen MyChart Message (if you have MyChart) OR . A paper copy in the mail If you have any lab test that is abnormal or we need to change your treatment, we will call you to review the results.   Medication Instructions:    Continue with your current medicines.    If you need a refill on your cardiac medications before your next appointment, please call your pharmacy.     Testing/Procedures To Be Arranged:  N/A  Follow-Up:   See Dr.[name] as planned in early January    At [facility], you and your health needs are our priority.  As part of our continuing mission to provide you with exceptional heart care, we have created designated Provider Care Teams.  These Care Teams include your primary Cardiologist (physician) and Advanced Practice Providers (APPs -  Physician Assistants and Nurse Practitioners) who all work together to provide you with the care you need, when you need it.  Special Instructions:  . Keep restricting your salt and weighing daily.  . Call us if your weight starts going back up.   Call the [facility] office at [phone] if you have any questions, problems or concerns.

## 2018-07-31 NOTE — Telephone Encounter (Signed)
New Message    [name] with [facility] is calling to obtain skilled nursing orders for two times a week for 8 weeks for CHF management. Mia Contreras

## 2018-08-01 ENCOUNTER — Telehealth: Payer: Self-pay | Admitting: Cardiovascular Disease

## 2018-08-01 ENCOUNTER — Telehealth: Payer: Self-pay | Admitting: *Deleted

## 2018-08-01 DIAGNOSIS — Z79899 Other long term (current) drug therapy: Secondary | ICD-10-CM

## 2018-08-01 MED ORDER — POTASSIUM CHLORIDE CRYS ER 20 MEQ PO TBCR: 20.0000 meq | EXTENDED_RELEASE_TABLET | Freq: Every day | ORAL | 3 refills | Status: DC

## 2018-08-01 NOTE — Telephone Encounter (Signed)
-----   Message from Burtis Junes, NP sent at 08/01/2018  7:57 AM EST ----- Please call - let's start her on Kdur 20 meq daily. BMET in one week.  Remind her to NOT take any more Metolazone.

## 2018-08-01 NOTE — Telephone Encounter (Signed)
Patient is returning call.  °

## 2018-08-01 NOTE — Telephone Encounter (Signed)
Electronic rx down due to upgrade. Phoned in Potassium 20 meq qd # 30 with 3 refills to [facility], spoke with April.

## 2018-08-02 DIAGNOSIS — I4891 Unspecified atrial fibrillation: Secondary | ICD-10-CM | POA: Diagnosis not present

## 2018-08-02 DIAGNOSIS — E785 Hyperlipidemia, unspecified: Secondary | ICD-10-CM | POA: Diagnosis not present

## 2018-08-02 DIAGNOSIS — F419 Anxiety disorder, unspecified: Secondary | ICD-10-CM | POA: Diagnosis not present

## 2018-08-02 DIAGNOSIS — I87311 Chronic venous hypertension (idiopathic) with ulcer of right lower extremity: Secondary | ICD-10-CM | POA: Diagnosis not present

## 2018-08-02 DIAGNOSIS — I890 Lymphedema, not elsewhere classified: Secondary | ICD-10-CM | POA: Diagnosis not present

## 2018-08-02 DIAGNOSIS — I5082 Biventricular heart failure: Secondary | ICD-10-CM | POA: Diagnosis not present

## 2018-08-05 ENCOUNTER — Other Ambulatory Visit: Payer: Medicare Other | Admitting: *Deleted

## 2018-08-05 DIAGNOSIS — I87311 Chronic venous hypertension (idiopathic) with ulcer of right lower extremity: Secondary | ICD-10-CM | POA: Diagnosis not present

## 2018-08-05 DIAGNOSIS — I4891 Unspecified atrial fibrillation: Secondary | ICD-10-CM | POA: Diagnosis not present

## 2018-08-05 DIAGNOSIS — Z79899 Other long term (current) drug therapy: Secondary | ICD-10-CM | POA: Diagnosis not present

## 2018-08-05 DIAGNOSIS — I5082 Biventricular heart failure: Secondary | ICD-10-CM | POA: Diagnosis not present

## 2018-08-05 DIAGNOSIS — F419 Anxiety disorder, unspecified: Secondary | ICD-10-CM | POA: Diagnosis not present

## 2018-08-05 DIAGNOSIS — E785 Hyperlipidemia, unspecified: Secondary | ICD-10-CM | POA: Diagnosis not present

## 2018-08-05 DIAGNOSIS — I890 Lymphedema, not elsewhere classified: Secondary | ICD-10-CM | POA: Diagnosis not present

## 2018-08-05 LAB — BASIC METABOLIC PANEL
BUN/Creatinine Ratio: 27 (ref 12–28)
BUN: 40 mg/dL — ABNORMAL HIGH (ref 8–27)
CO2: 33 mmol/L — ABNORMAL HIGH (ref 20–29)
Calcium: 9.9 mg/dL (ref 8.7–10.3)
Chloride: 90 mmol/L — ABNORMAL LOW (ref 96–106)
Creatinine, Ser: 1.47 mg/dL — ABNORMAL HIGH (ref 0.57–1.00)
GFR calc Af Amer: 37 mL/min/{1.73_m2} — ABNORMAL LOW (ref >59)
GFR calc non Af Amer: 32 mL/min/{1.73_m2} — ABNORMAL LOW (ref >59)
Glucose: 87 mg/dL (ref 65–99)
Potassium: 4.3 mmol/L (ref 3.5–5.2)
Sodium: 140 mmol/L (ref 134–144)

## 2018-08-06 ENCOUNTER — Ambulatory Visit: Payer: Medicare Other | Admitting: Internal Medicine

## 2018-08-06 DIAGNOSIS — S8012XA Contusion of left lower leg, initial encounter: Secondary | ICD-10-CM | POA: Diagnosis not present

## 2018-08-06 DIAGNOSIS — I87331 Chronic venous hypertension (idiopathic) with ulcer and inflammation of right lower extremity: Secondary | ICD-10-CM | POA: Diagnosis not present

## 2018-08-06 DIAGNOSIS — L97212 Non-pressure chronic ulcer of right calf with fat layer exposed: Secondary | ICD-10-CM | POA: Diagnosis not present

## 2018-08-06 DIAGNOSIS — Z96653 Presence of artificial knee joint, bilateral: Secondary | ICD-10-CM | POA: Diagnosis not present

## 2018-08-06 DIAGNOSIS — Z87891 Personal history of nicotine dependence: Secondary | ICD-10-CM | POA: Diagnosis not present

## 2018-08-06 DIAGNOSIS — S81811A Laceration without foreign body, right lower leg, initial encounter: Secondary | ICD-10-CM | POA: Diagnosis not present

## 2018-08-08 DIAGNOSIS — E785 Hyperlipidemia, unspecified: Secondary | ICD-10-CM | POA: Diagnosis not present

## 2018-08-08 DIAGNOSIS — I4891 Unspecified atrial fibrillation: Secondary | ICD-10-CM | POA: Diagnosis not present

## 2018-08-08 DIAGNOSIS — I5082 Biventricular heart failure: Secondary | ICD-10-CM | POA: Diagnosis not present

## 2018-08-08 DIAGNOSIS — I890 Lymphedema, not elsewhere classified: Secondary | ICD-10-CM | POA: Diagnosis not present

## 2018-08-08 DIAGNOSIS — I87311 Chronic venous hypertension (idiopathic) with ulcer of right lower extremity: Secondary | ICD-10-CM | POA: Diagnosis not present

## 2018-08-08 DIAGNOSIS — F419 Anxiety disorder, unspecified: Secondary | ICD-10-CM | POA: Diagnosis not present

## 2018-08-12 DIAGNOSIS — Z96653 Presence of artificial knee joint, bilateral: Secondary | ICD-10-CM | POA: Diagnosis not present

## 2018-08-12 DIAGNOSIS — L97212 Non-pressure chronic ulcer of right calf with fat layer exposed: Secondary | ICD-10-CM | POA: Diagnosis not present

## 2018-08-12 DIAGNOSIS — Z87891 Personal history of nicotine dependence: Secondary | ICD-10-CM | POA: Diagnosis not present

## 2018-08-12 DIAGNOSIS — S8012XA Contusion of left lower leg, initial encounter: Secondary | ICD-10-CM | POA: Diagnosis not present

## 2018-08-12 DIAGNOSIS — S81801A Unspecified open wound, right lower leg, initial encounter: Secondary | ICD-10-CM | POA: Diagnosis not present

## 2018-08-12 DIAGNOSIS — I87331 Chronic venous hypertension (idiopathic) with ulcer and inflammation of right lower extremity: Secondary | ICD-10-CM | POA: Diagnosis not present

## 2018-08-12 DIAGNOSIS — I872 Venous insufficiency (chronic) (peripheral): Secondary | ICD-10-CM | POA: Diagnosis not present

## 2018-08-12 DIAGNOSIS — S81811A Laceration without foreign body, right lower leg, initial encounter: Secondary | ICD-10-CM | POA: Diagnosis not present

## 2018-08-16 DIAGNOSIS — I87311 Chronic venous hypertension (idiopathic) with ulcer of right lower extremity: Secondary | ICD-10-CM | POA: Diagnosis not present

## 2018-08-16 DIAGNOSIS — F419 Anxiety disorder, unspecified: Secondary | ICD-10-CM | POA: Diagnosis not present

## 2018-08-16 DIAGNOSIS — I5082 Biventricular heart failure: Secondary | ICD-10-CM | POA: Diagnosis not present

## 2018-08-16 DIAGNOSIS — E785 Hyperlipidemia, unspecified: Secondary | ICD-10-CM | POA: Diagnosis not present

## 2018-08-16 DIAGNOSIS — I4891 Unspecified atrial fibrillation: Secondary | ICD-10-CM | POA: Diagnosis not present

## 2018-08-16 DIAGNOSIS — I890 Lymphedema, not elsewhere classified: Secondary | ICD-10-CM | POA: Diagnosis not present

## 2018-08-20 DIAGNOSIS — L97212 Non-pressure chronic ulcer of right calf with fat layer exposed: Secondary | ICD-10-CM | POA: Diagnosis not present

## 2018-08-20 DIAGNOSIS — Z96653 Presence of artificial knee joint, bilateral: Secondary | ICD-10-CM | POA: Diagnosis not present

## 2018-08-20 DIAGNOSIS — Z87891 Personal history of nicotine dependence: Secondary | ICD-10-CM | POA: Diagnosis not present

## 2018-08-20 DIAGNOSIS — S8012XA Contusion of left lower leg, initial encounter: Secondary | ICD-10-CM | POA: Diagnosis not present

## 2018-08-20 DIAGNOSIS — I87311 Chronic venous hypertension (idiopathic) with ulcer of right lower extremity: Secondary | ICD-10-CM | POA: Diagnosis not present

## 2018-08-20 DIAGNOSIS — S81811A Laceration without foreign body, right lower leg, initial encounter: Secondary | ICD-10-CM | POA: Diagnosis not present

## 2018-08-20 DIAGNOSIS — I87331 Chronic venous hypertension (idiopathic) with ulcer and inflammation of right lower extremity: Secondary | ICD-10-CM | POA: Diagnosis not present

## 2018-08-20 NOTE — Progress Notes (Signed)
CARDIOLOGY OFFICE NOTE  Date:  08/26/2018    Mia Contreras Date of Birth: 04/24/1929 Medical Record #[id number]  PCP:  Cassandria Anger, MD  Cardiologist:  Gillian Shields    No chief complaint on file.   History of Present Illness:  84 y.o. with NICM, CKD, Breast cancer and chronic afib. Breast CA recurred and had lumpectomy 06/11/18 Last TTE 06/19/18 with worsening EF 20-25%. No chest pain and with age and CKD heart cath not pursued Rx CHF also limited by soft BP.   Poor social situation with no real support/no family/lives alone.   Wearing Una boot on left leg seeing wound clinic for poorly healing ulcer  Doing much better. Less edema Weight down Had bad experience with [facility]  Getting cut by pair scissors changing dressing   Past Medical History:  Diagnosis Date  . AKI (acute kidney injury) ([place]) 01/2016  . ANEMIA-NOS 08/20/2007  . ANXIETY 03/16/2007  . BREAST CANCER, HX OF 03/16/2007  . CARPAL TUNNEL SYNDROME, RIGHT 01/11/2008  . Cataract    floaters  . CELLULITIS, LEG, RIGHT 08/20/2007  . CHF (congestive heart failure) ([place])   . Collagenous colitis   . Congestive heart failure with right ventricular systolic dysfunction ([place]) 06/19/2018  . COPD (chronic obstructive pulmonary disease) ([place])   . DIVERTICULOSIS, COLON 08/20/2007  . Dyspnea    on exertion  . Dysrhythmia    atrial fib  . Family history of brain tumor   . Family history of lung cancer   . Family history of lymphoma   . Family history of prostate cancer   . Family history of skin cancer   . GOITER, MULTINODULAR 05/06/2010   Dr [name]  . HYPERLIPIDEMIA 08/20/2007  . HYPERTENSION 03/16/2007  . HYPERTHYROIDISM 05/23/2010  . IBS (irritable bowel syndrome)   . OSTEOARTHRITIS 03/16/2007  . OSTEOPOROSIS 08/20/2007  . Personal history of breast cancer 07/01/2018  . Pneumonia 07/2011   took abx for several weeks  . PREMATURE ATRIAL CONTRACTIONS 09/21/2010  . SCOLIOSIS 01/11/2008  .  Severe tricuspid regurgitation 06/19/2018  . SKIN CANCER, HX OF 11/12/2008    R cheek 2010, L Cheek 2011 Dr. Tonia Brooms  . Thyroid nodule   . Uterine cancer ([place])   . VENOUS INSUFFICIENCY 09/05/2007    Past Surgical History:  Procedure Laterality Date  . ABDOMINAL HYSTERECTOMY    . APPENDECTOMY  1972  . BREAST LUMPECTOMY Left 2001  . BREAST LUMPECTOMY WITH RADIOACTIVE SEED LOCALIZATION Left 06/11/2018   Procedure: LEFT BREAST LUMPECTOMY WITH BRACKETED RADIOACTIVE SEED LOCALIZATION;  Surgeon: Fanny Skates, MD;  Location: [facility];  Service: General;  Laterality: Left;  . CARPAL TUNNEL RELEASE    . CATARACT EXTRACTION    . EYE SURGERY     cataract ext  . HERNIA REPAIR    . MASTECTOMY, PARTIAL Left   . SKIN CANCER EXCISION     face-Dr. Bing Plume  . TOTAL KNEE ARTHROPLASTY    . TOTAL KNEE ARTHROPLASTY Right 2010   Dr Para March  . TOTAL KNEE ARTHROPLASTY  10/30/2011   Procedure: TOTAL KNEE ARTHROPLASTY;  Surgeon: Lorn Junes, MD;  Location: [facility];  Service: Orthopedics;  Laterality: Left;     . XRT     chemo, surgery     Medications: Current Meds  Medication Sig  . ALPRAZolam (XANAX) 0.5 MG tablet Take 1 tablet (0.5 mg total) by mouth 2 (two) times daily as needed for anxiety.  Marland Kitchen apixaban (ELIQUIS) 2.5  MG TABS tablet Take 1 tablet (2.5 mg total) by mouth 2 (two) times daily.  . carvedilol (COREG) 3.125 MG tablet Take 1 tablet (3.125 mg total) by mouth 2 (two) times daily with a meal.  . coal tar (NEUTROGENA T-GEL) 0.5 % shampoo Apply topically 2 (two) times a week.   Marland Kitchen ketoconazole (NIZORAL) 2 % shampoo Apply 1 application topically 2 (two) times a week.  Vladimir Faster Glycol-Propyl Glycol (SYSTANE ULTRA PF OP) Place 2 drops into both eyes as needed (for dry eyes).  . potassium chloride SA (K-DUR,KLOR-CON) 20 MEQ tablet Take 1 tablet (20 mEq total) by mouth daily.  Marland Kitchen torsemide (DEMADEX) 20 MG tablet TAKE TWO TABLETS BY MOUTH DAILY  . triamcinolone ointment (KENALOG) 0.1 % Apply 1  application topically 2 (two) times daily. Use qd -bid  . vitamin C (ASCORBIC ACID) 500 MG tablet Take 1 tablet (500 mg total) by mouth daily.     Allergies: Allergies  Allergen Reactions  . Cefuroxime Diarrhea  . Celecoxib Nausea Only  . Deltasone [Prednisone] Swelling    Oral deltasone is causing swelling (can use Depo-Medrol)  . Ranitidine Other (See Comments)    bloating  . Spironolactone Other (See Comments)    High K  . Tapazole [Methimazole] Rash  . Tramadol Hcl Nausea Only    Social History: The patient  reports that she quit smoking about 37 years ago. She has never used smokeless tobacco. She reports previous alcohol use. She reports that she does not use drugs.   Family History: The patient's family history includes Alcohol abuse in her father; Alzheimer's disease in her mother; Bladder Cancer in her brother; Brain cancer (age of onset: 71) in her maternal uncle; Brain cancer (age of onset: 6) in her maternal aunt; Dementia in her mother; Diabetes in an other family member; Heart attack in her father, maternal grandfather, and maternal grandmother; Heart disease in her mother; Hypertension in her mother; Lung cancer in her paternal uncle; Lymphoma (age of onset: 39) in an other family member; Mental retardation in her mother; Prostate cancer in her brother; Skin cancer in an other family member.   Review of Systems: Please see the history of present illness.   Otherwise, the review of systems is positive for none.   All other systems are reviewed and negative.   Physical Exam: VS:  BP 102/66   Pulse 75   Ht 5\' 3"  (1.6 m)   Wt 114 lb 6.4 oz (51.9 kg)   SpO2 90%   BMI 20.27 kg/m  .  BMI Body mass index is 20.27 kg/m.  Wt Readings from Last 3 Encounters:  08/26/18 114 lb 6.4 oz (51.9 kg)  07/31/18 109 lb 12.8 oz (49.8 kg)  07/23/18 127 lb 12.8 oz (58 kg)   Affect appropriate Chronically ill white female  HEENT: normal Neck supple with no adenopathy JVP normal  no bruits no thyromegaly Lungs clear with no wheezing and good diaphragmatic motion Heart:  S1/S2 no murmur, no rub, gallop or click PMI increased  Abdomen: benighn, BS positve, no tenderness, no AAA no bruit.  No HSM or HJR Distal pulses intact with no bruits Plus 1 LE  Edema both legs wrapped  Neuro non-focal Una boot left LLE  No muscular weakness    LABORATORY DATA:  EKG:   06/19/18 AFib rate 96 LAD low voltage   Lab Results  Component Value Date   WBC 7.1 06/18/2018   HGB 13.3 06/18/2018   HCT 41.9 06/18/2018  PLT 197 06/18/2018   GLUCOSE 87 08/05/2018   CHOL 130 11/04/2015   TRIG 77.0 11/04/2015   HDL 54.50 11/04/2015   LDLCALC 60 11/04/2015   ALT 27 06/17/2018   AST 33 06/17/2018   NA 140 08/05/2018   K 4.3 08/05/2018   CL 90 (L) 08/05/2018   CREATININE 1.47 (H) 08/05/2018   BUN 40 (H) 08/05/2018   CO2 33 (H) 08/05/2018   TSH 1.82 05/06/2018   INR 0.95 10/25/2011   HGBA1C 5.8 05/06/2018   MICROALBUR 0.6 03/23/2011     BNP (last 3 results) Recent Labs    06/18/18 1905  BNP 1,407.4*    ProBNP (last 3 results) Recent Labs    06/17/18 1544 07/23/18 1305 07/31/18 1118  PROBNP 1,573.0* 8,739* 7,046*     Other Studies Reviewed Today:  Echocardiogram 06/19/18: Study Conclusions - Left ventricle: The cavity size was normal. Wall thickness was normal. Systolic function was severely reduced. The estimated ejection fraction was in the range of 20% to 25%. Dyskinesis of the anteroseptal myocardium. - Mitral valve: There was mild regurgitation. - Left atrium: The atrium was mildly dilated. - Right ventricle: The cavity size was severely dilated. Systolic function was moderately reduced. - Right atrium: The atrium was severely dilated. - Tricuspid valve: There was moderate-severe regurgitation. - Pulmonary arteries: Systolic pressure was moderately increased. PA peak pressure: 58 mm Hg (S).    Assessment/Plan:  1. Acute on  chronic CHF -  Biventricular failure with CKD Not a candidate for ACE/ARB/ARNI Primary Rx salt restriction and diuretics TTE 06/19/18 EF 20-25%   2. Permanent AF - on Eliquis. Rate control with coreg   3. CKD - stage 3 - followed by nephrology. BUN 40 Cr 1.47 08/05/18  4. Diarrhea - ?from [facility] - this has improved - not reported today  5. COPD - chronic degree of shortness of breath.   6. Advanced age - options appear limited to me - no real family support - we have discussed this today.   7. HTN - low meds cut back to allow for diuretics   8. Leg wound - was sent to the wound clinic. Now with unna boot in place.    Current medicines are reviewed with the patient today.  The patient does not have concerns regarding medicines other than what has been noted above.  The following changes have been made:  See above.  Labs/ tests ordered today include:    Jenkins Rouge

## 2018-08-23 DIAGNOSIS — I4891 Unspecified atrial fibrillation: Secondary | ICD-10-CM | POA: Diagnosis not present

## 2018-08-23 DIAGNOSIS — I5082 Biventricular heart failure: Secondary | ICD-10-CM | POA: Diagnosis not present

## 2018-08-23 DIAGNOSIS — E785 Hyperlipidemia, unspecified: Secondary | ICD-10-CM | POA: Diagnosis not present

## 2018-08-23 DIAGNOSIS — I890 Lymphedema, not elsewhere classified: Secondary | ICD-10-CM | POA: Diagnosis not present

## 2018-08-23 DIAGNOSIS — I87311 Chronic venous hypertension (idiopathic) with ulcer of right lower extremity: Secondary | ICD-10-CM | POA: Diagnosis not present

## 2018-08-23 DIAGNOSIS — F419 Anxiety disorder, unspecified: Secondary | ICD-10-CM | POA: Diagnosis not present

## 2018-08-26 ENCOUNTER — Encounter: Payer: Self-pay | Admitting: Cardiovascular Disease

## 2018-08-26 ENCOUNTER — Ambulatory Visit (INDEPENDENT_AMBULATORY_CARE_PROVIDER_SITE_OTHER): Payer: Medicare Other | Admitting: Cardiovascular Disease

## 2018-08-26 VITALS — BP 102/66 | HR 75 | Ht 63.0 in | Wt 114.4 lb

## 2018-08-26 DIAGNOSIS — N183 Chronic kidney disease, stage 3 unspecified: Secondary | ICD-10-CM

## 2018-08-26 DIAGNOSIS — I5022 Chronic systolic (congestive) heart failure: Secondary | ICD-10-CM

## 2018-08-26 DIAGNOSIS — I482 Chronic atrial fibrillation, unspecified: Secondary | ICD-10-CM

## 2018-08-26 DIAGNOSIS — I4821 Permanent atrial fibrillation: Secondary | ICD-10-CM

## 2018-08-26 DIAGNOSIS — I10 Essential (primary) hypertension: Secondary | ICD-10-CM

## 2018-08-26 NOTE — Patient Instructions (Signed)

## 2018-08-27 DIAGNOSIS — I890 Lymphedema, not elsewhere classified: Secondary | ICD-10-CM | POA: Diagnosis not present

## 2018-08-27 DIAGNOSIS — I5082 Biventricular heart failure: Secondary | ICD-10-CM | POA: Diagnosis not present

## 2018-08-27 DIAGNOSIS — E785 Hyperlipidemia, unspecified: Secondary | ICD-10-CM | POA: Diagnosis not present

## 2018-08-27 DIAGNOSIS — I4891 Unspecified atrial fibrillation: Secondary | ICD-10-CM | POA: Diagnosis not present

## 2018-08-27 DIAGNOSIS — F419 Anxiety disorder, unspecified: Secondary | ICD-10-CM | POA: Diagnosis not present

## 2018-08-27 DIAGNOSIS — I87311 Chronic venous hypertension (idiopathic) with ulcer of right lower extremity: Secondary | ICD-10-CM | POA: Diagnosis not present

## 2018-08-30 DIAGNOSIS — I4891 Unspecified atrial fibrillation: Secondary | ICD-10-CM | POA: Diagnosis not present

## 2018-08-30 DIAGNOSIS — I87311 Chronic venous hypertension (idiopathic) with ulcer of right lower extremity: Secondary | ICD-10-CM | POA: Diagnosis not present

## 2018-08-30 DIAGNOSIS — I890 Lymphedema, not elsewhere classified: Secondary | ICD-10-CM | POA: Diagnosis not present

## 2018-08-30 DIAGNOSIS — I5082 Biventricular heart failure: Secondary | ICD-10-CM | POA: Diagnosis not present

## 2018-08-30 DIAGNOSIS — F419 Anxiety disorder, unspecified: Secondary | ICD-10-CM | POA: Diagnosis not present

## 2018-08-30 DIAGNOSIS — E785 Hyperlipidemia, unspecified: Secondary | ICD-10-CM | POA: Diagnosis not present

## 2018-09-03 ENCOUNTER — Encounter (HOSPITAL_BASED_OUTPATIENT_CLINIC_OR_DEPARTMENT_OTHER): Payer: Medicare Other | Attending: Internal Medicine

## 2018-09-03 DIAGNOSIS — L97812 Non-pressure chronic ulcer of other part of right lower leg with fat layer exposed: Secondary | ICD-10-CM | POA: Diagnosis not present

## 2018-09-03 DIAGNOSIS — I10 Essential (primary) hypertension: Secondary | ICD-10-CM | POA: Diagnosis not present

## 2018-09-03 DIAGNOSIS — J449 Chronic obstructive pulmonary disease, unspecified: Secondary | ICD-10-CM | POA: Insufficient documentation

## 2018-09-03 DIAGNOSIS — I87331 Chronic venous hypertension (idiopathic) with ulcer and inflammation of right lower extremity: Secondary | ICD-10-CM | POA: Diagnosis not present

## 2018-09-03 DIAGNOSIS — B9562 Methicillin resistant Staphylococcus aureus infection as the cause of diseases classified elsewhere: Secondary | ICD-10-CM | POA: Insufficient documentation

## 2018-09-03 DIAGNOSIS — L97212 Non-pressure chronic ulcer of right calf with fat layer exposed: Secondary | ICD-10-CM | POA: Insufficient documentation

## 2018-09-03 DIAGNOSIS — Z9221 Personal history of antineoplastic chemotherapy: Secondary | ICD-10-CM | POA: Insufficient documentation

## 2018-09-03 DIAGNOSIS — Z923 Personal history of irradiation: Secondary | ICD-10-CM | POA: Insufficient documentation

## 2018-09-03 DIAGNOSIS — I87311 Chronic venous hypertension (idiopathic) with ulcer of right lower extremity: Secondary | ICD-10-CM | POA: Diagnosis not present

## 2018-09-03 DIAGNOSIS — I87332 Chronic venous hypertension (idiopathic) with ulcer and inflammation of left lower extremity: Secondary | ICD-10-CM | POA: Diagnosis not present

## 2018-09-03 DIAGNOSIS — L97822 Non-pressure chronic ulcer of other part of left lower leg with fat layer exposed: Secondary | ICD-10-CM | POA: Diagnosis not present

## 2018-09-04 ENCOUNTER — Ambulatory Visit (INDEPENDENT_AMBULATORY_CARE_PROVIDER_SITE_OTHER): Payer: Medicare Other | Admitting: Internal Medicine

## 2018-09-04 ENCOUNTER — Encounter: Payer: Self-pay | Admitting: Internal Medicine

## 2018-09-04 ENCOUNTER — Encounter

## 2018-09-04 VITALS — BP 108/62 | HR 88 | Temp 98.0°F | Ht 63.0 in | Wt 114.0 lb

## 2018-09-04 DIAGNOSIS — N183 Chronic kidney disease, stage 3 unspecified: Secondary | ICD-10-CM

## 2018-09-04 DIAGNOSIS — S81811D Laceration without foreign body, right lower leg, subsequent encounter: Secondary | ICD-10-CM | POA: Diagnosis not present

## 2018-09-04 DIAGNOSIS — I4891 Unspecified atrial fibrillation: Secondary | ICD-10-CM | POA: Diagnosis not present

## 2018-09-04 DIAGNOSIS — I5022 Chronic systolic (congestive) heart failure: Secondary | ICD-10-CM | POA: Diagnosis not present

## 2018-09-04 DIAGNOSIS — M810 Age-related osteoporosis without current pathological fracture: Secondary | ICD-10-CM

## 2018-09-04 MED ORDER — DENOSUMAB 60 MG/ML ~~LOC~~ SOSY
60.0000 mg | PREFILLED_SYRINGE | Freq: Once | SUBCUTANEOUS | Status: AC
  Administered 2018-09-04: 60 mg via SUBCUTANEOUS

## 2018-09-04 NOTE — Assessment & Plan Note (Signed)
Monitoring BMET 

## 2018-09-04 NOTE — Progress Notes (Signed)
Subjective:  Patient ID: Mia Contreras, female    DOB: 07/23/1929  Age: 85 y.o. MRN: [id number]  CC: No chief complaint on file.   HPI Mia Contreras presents for CHF, LE edema. L LE wound treated by Dr Dellia Nims  Outpatient Medications Prior to Visit  Medication Sig Dispense Refill  . ALPRAZolam (XANAX) 0.5 MG tablet Take 1 tablet (0.5 mg total) by mouth 2 (two) times daily as needed for anxiety. 60 tablet 3  . apixaban (ELIQUIS) 2.5 MG TABS tablet Take 1 tablet (2.5 mg total) by mouth 2 (two) times daily. 60 tablet 11  . carvedilol (COREG) 3.125 MG tablet Take 1 tablet (3.125 mg total) by mouth 2 (two) times daily with a meal. 60 tablet 11  . coal tar (NEUTROGENA T-GEL) 0.5 % shampoo Apply topically 2 (two) times a week.     Marland Kitchen ketoconazole (NIZORAL) 2 % shampoo Apply 1 application topically 2 (two) times a week.    Vladimir Faster Glycol-Propyl Glycol (SYSTANE ULTRA PF OP) Place 2 drops into both eyes as needed (for dry eyes).    . potassium chloride SA (K-DUR,KLOR-CON) 20 MEQ tablet Take 1 tablet (20 mEq total) by mouth daily. 30 tablet 3  . torsemide (DEMADEX) 20 MG tablet TAKE TWO TABLETS BY MOUTH DAILY 180 tablet 5  . triamcinolone ointment (KENALOG) 0.1 % Apply 1 application topically 2 (two) times daily. Use qd -bid 80 g 2  . vitamin C (ASCORBIC ACID) 500 MG tablet Take 1 tablet (500 mg total) by mouth daily. 100 tablet 3   No facility-administered medications prior to visit.     ROS: Review of Systems  Constitutional: Positive for fatigue. Negative for activity change, appetite change, chills and unexpected weight change.  HENT: Negative for congestion, mouth sores and sinus pressure.   Eyes: Negative for visual disturbance.  Respiratory: Negative for cough and chest tightness.   Cardiovascular: Positive for leg swelling.  Gastrointestinal: Negative for abdominal pain and nausea.  Genitourinary: Negative for difficulty urinating, frequency and vaginal pain.  Musculoskeletal:  Positive for arthralgias. Negative for back pain and gait problem.  Skin: Positive for color change and wound. Negative for pallor and rash.  Neurological: Negative for dizziness, tremors, weakness, numbness and headaches.  Psychiatric/Behavioral: Negative for confusion and sleep disturbance.    Objective:  BP 108/62 (BP Location: Left Arm, Patient Position: Sitting, Cuff Size: Normal)   Pulse 88   Temp 98 F (36.7 C) (Oral)   Ht 5\' 3"  (1.6 m)   Wt 114 lb (51.7 kg)   SpO2 97%   BMI 20.19 kg/m   BP Readings from Last 3 Encounters:  09/04/18 108/62  08/26/18 102/66  07/31/18 106/64    Wt Readings from Last 3 Encounters:  09/04/18 114 lb (51.7 kg)  08/26/18 114 lb 6.4 oz (51.9 kg)  07/31/18 109 lb 12.8 oz (49.8 kg)    Physical Exam Constitutional:      General: She is not in acute distress.    Appearance: She is well-developed.  HENT:     Head: Normocephalic.     Right Ear: External ear normal.     Left Ear: External ear normal.     Nose: Nose normal.  Eyes:     General:        Right eye: No discharge.        Left eye: No discharge.     Conjunctiva/sclera: Conjunctivae normal.     Pupils: Pupils are equal, round, and reactive to  light.  Neck:     Musculoskeletal: Normal range of motion and neck supple.     Thyroid: No thyromegaly.     Vascular: No JVD.     Trachea: No tracheal deviation.  Cardiovascular:     Rate and Rhythm: Normal rate and regular rhythm.     Heart sounds: Normal heart sounds.  Pulmonary:     Effort: No respiratory distress.     Breath sounds: No stridor. No wheezing.  Abdominal:     General: Bowel sounds are normal. There is no distension.     Palpations: Abdomen is soft. There is no mass.     Tenderness: There is no abdominal tenderness. There is no guarding or rebound.  Musculoskeletal:        General: No tenderness.  Lymphadenopathy:     Cervical: No cervical adenopathy.  Skin:    Findings: No erythema or rash.  Neurological:      Cranial Nerves: No cranial nerve deficit.     Motor: No abnormal muscle tone.     Coordination: Coordination normal.     Deep Tendon Reflexes: Reflexes normal.  Psychiatric:        Behavior: Behavior normal.        Thought Content: Thought content normal.        Judgment: Judgment normal.    B edema B legs are dressed   Lab Results  Component Value Date   WBC 7.1 06/18/2018   HGB 13.3 06/18/2018   HCT 41.9 06/18/2018   PLT 197 06/18/2018   GLUCOSE 87 08/05/2018   CHOL 130 11/04/2015   TRIG 77.0 11/04/2015   HDL 54.50 11/04/2015   LDLCALC 60 11/04/2015   ALT 27 06/17/2018   AST 33 06/17/2018   NA 140 08/05/2018   K 4.3 08/05/2018   CL 90 (L) 08/05/2018   CREATININE 1.47 (H) 08/05/2018   BUN 40 (H) 08/05/2018   CO2 33 (H) 08/05/2018   TSH 1.82 05/06/2018   INR 0.95 10/25/2011   HGBA1C 5.8 05/06/2018   MICROALBUR 0.6 03/23/2011    Dg Chest 2 View  Result Date: 06/18/2018 CLINICAL DATA:  Bilateral lower extremity swelling for 3 days. CHF. Recent left breast lumpectomy. EXAM: CHEST - 2 VIEW COMPARISON:  06/17/2018 FINDINGS: The cardiac silhouette remains enlarged. Aortic atherosclerosis and a large hiatal hernia are again noted. There is mild chronic interstitial coarsening which is likely related to patient's history of COPD. No airspace consolidation, overt pulmonary edema, pleural effusion, or pneumothorax is identified. Surgical clips project over the left breast and axilla. Degenerative changes are noted at the shoulders, and there is severe lumbar levoscoliosis. IMPRESSION: 1. No active cardiopulmonary disease. 2. Cardiomegaly, COPD, and large hiatal hernia. Electronically Signed   By: Logan Bores M.D.   On: 06/18/2018 20:17    Assessment & Plan:   There are no diagnoses linked to this encounter.   No orders of the defined types were placed in this encounter.    Follow-up: No follow-ups on file.  Walker Kehr, MD

## 2018-09-04 NOTE — Assessment & Plan Note (Signed)
Demadex, Coreg

## 2018-09-04 NOTE — Assessment & Plan Note (Signed)
Eliquis 

## 2018-09-04 NOTE — Assessment & Plan Note (Signed)
F/u w/Dr Dellia Nims - Wound Clinic Increase protein intake

## 2018-09-04 NOTE — Assessment & Plan Note (Signed)
prolia 

## 2018-09-04 NOTE — Addendum Note (Signed)
Addended by: Karren Cobble on: 09/04/2018 03:07 PM   Modules accepted: Orders

## 2018-09-06 DIAGNOSIS — F419 Anxiety disorder, unspecified: Secondary | ICD-10-CM | POA: Diagnosis not present

## 2018-09-06 DIAGNOSIS — I4891 Unspecified atrial fibrillation: Secondary | ICD-10-CM | POA: Diagnosis not present

## 2018-09-06 DIAGNOSIS — I5082 Biventricular heart failure: Secondary | ICD-10-CM | POA: Diagnosis not present

## 2018-09-06 DIAGNOSIS — E785 Hyperlipidemia, unspecified: Secondary | ICD-10-CM | POA: Diagnosis not present

## 2018-09-06 DIAGNOSIS — I890 Lymphedema, not elsewhere classified: Secondary | ICD-10-CM | POA: Diagnosis not present

## 2018-09-06 DIAGNOSIS — I87311 Chronic venous hypertension (idiopathic) with ulcer of right lower extremity: Secondary | ICD-10-CM | POA: Diagnosis not present

## 2018-09-10 ENCOUNTER — Other Ambulatory Visit (HOSPITAL_COMMUNITY)
Admission: RE | Admit: 2018-09-10 | Discharge: 2018-09-10 | Disposition: A | Discharge status: Home / Self Care | Payer: Medicare Other | Source: Other Acute Inpatient Hospital | Attending: Internal Medicine | Admitting: Internal Medicine

## 2018-09-10 DIAGNOSIS — L089 Local infection of the skin and subcutaneous tissue, unspecified: Secondary | ICD-10-CM | POA: Diagnosis not present

## 2018-09-10 DIAGNOSIS — I87331 Chronic venous hypertension (idiopathic) with ulcer and inflammation of right lower extremity: Secondary | ICD-10-CM | POA: Diagnosis not present

## 2018-09-10 DIAGNOSIS — S81812D Laceration without foreign body, left lower leg, subsequent encounter: Secondary | ICD-10-CM | POA: Diagnosis not present

## 2018-09-10 DIAGNOSIS — B9562 Methicillin resistant Staphylococcus aureus infection as the cause of diseases classified elsewhere: Secondary | ICD-10-CM | POA: Diagnosis not present

## 2018-09-10 DIAGNOSIS — L97812 Non-pressure chronic ulcer of other part of right lower leg with fat layer exposed: Secondary | ICD-10-CM | POA: Diagnosis not present

## 2018-09-10 DIAGNOSIS — L97212 Non-pressure chronic ulcer of right calf with fat layer exposed: Secondary | ICD-10-CM | POA: Diagnosis not present

## 2018-09-10 DIAGNOSIS — J449 Chronic obstructive pulmonary disease, unspecified: Secondary | ICD-10-CM | POA: Diagnosis not present

## 2018-09-10 DIAGNOSIS — L97822 Non-pressure chronic ulcer of other part of left lower leg with fat layer exposed: Secondary | ICD-10-CM | POA: Diagnosis not present

## 2018-09-10 DIAGNOSIS — S81802A Unspecified open wound, left lower leg, initial encounter: Secondary | ICD-10-CM | POA: Diagnosis not present

## 2018-09-10 DIAGNOSIS — S81811A Laceration without foreign body, right lower leg, initial encounter: Secondary | ICD-10-CM | POA: Diagnosis not present

## 2018-09-13 DIAGNOSIS — I5082 Biventricular heart failure: Secondary | ICD-10-CM | POA: Diagnosis not present

## 2018-09-13 DIAGNOSIS — F419 Anxiety disorder, unspecified: Secondary | ICD-10-CM | POA: Diagnosis not present

## 2018-09-13 DIAGNOSIS — I890 Lymphedema, not elsewhere classified: Secondary | ICD-10-CM | POA: Diagnosis not present

## 2018-09-13 DIAGNOSIS — E785 Hyperlipidemia, unspecified: Secondary | ICD-10-CM | POA: Diagnosis not present

## 2018-09-13 DIAGNOSIS — I87311 Chronic venous hypertension (idiopathic) with ulcer of right lower extremity: Secondary | ICD-10-CM | POA: Diagnosis not present

## 2018-09-13 DIAGNOSIS — I4891 Unspecified atrial fibrillation: Secondary | ICD-10-CM | POA: Diagnosis not present

## 2018-09-13 LAB — AEROBIC CULTURE W GRAM STAIN (SUPERFICIAL SPECIMEN)

## 2018-09-13 LAB — AEROBIC CULTURE  (SUPERFICIAL SPECIMEN)

## 2018-09-16 ENCOUNTER — Observation Stay (HOSPITAL_COMMUNITY)
Admission: EM | Admit: 2018-09-16 | Discharge: 2018-09-17 | Disposition: A | Discharge status: Home / Self Care | Payer: Medicare Other | Attending: Internal Medicine | Admitting: Internal Medicine

## 2018-09-16 ENCOUNTER — Encounter (HOSPITAL_COMMUNITY): Payer: Self-pay | Admitting: Emergency Medicine

## 2018-09-16 ENCOUNTER — Encounter: Payer: Self-pay | Admitting: Family

## 2018-09-16 ENCOUNTER — Other Ambulatory Visit: Payer: Self-pay

## 2018-09-16 ENCOUNTER — Ambulatory Visit (INDEPENDENT_AMBULATORY_CARE_PROVIDER_SITE_OTHER): Payer: Medicare Other | Admitting: Family

## 2018-09-16 ENCOUNTER — Emergency Department (HOSPITAL_COMMUNITY): Payer: Medicare Other

## 2018-09-16 VITALS — BP 102/62 | HR 120 | Temp 97.8°F | Ht 63.0 in

## 2018-09-16 DIAGNOSIS — E860 Dehydration: Principal | ICD-10-CM | POA: Diagnosis present

## 2018-09-16 DIAGNOSIS — J440 Chronic obstructive pulmonary disease with acute lower respiratory infection: Secondary | ICD-10-CM | POA: Diagnosis not present

## 2018-09-16 DIAGNOSIS — R05 Cough: Secondary | ICD-10-CM

## 2018-09-16 DIAGNOSIS — Z96653 Presence of artificial knee joint, bilateral: Secondary | ICD-10-CM | POA: Insufficient documentation

## 2018-09-16 DIAGNOSIS — E0590 Thyrotoxicosis, unspecified without thyrotoxic crisis or storm: Secondary | ICD-10-CM | POA: Diagnosis not present

## 2018-09-16 DIAGNOSIS — R197 Diarrhea, unspecified: Secondary | ICD-10-CM | POA: Diagnosis not present

## 2018-09-16 DIAGNOSIS — Z9012 Acquired absence of left breast and nipple: Secondary | ICD-10-CM | POA: Insufficient documentation

## 2018-09-16 DIAGNOSIS — Z881 Allergy status to other antibiotic agents status: Secondary | ICD-10-CM | POA: Diagnosis not present

## 2018-09-16 DIAGNOSIS — Z888 Allergy status to other drugs, medicaments and biological substances status: Secondary | ICD-10-CM | POA: Insufficient documentation

## 2018-09-16 DIAGNOSIS — L03115 Cellulitis of right lower limb: Secondary | ICD-10-CM | POA: Insufficient documentation

## 2018-09-16 DIAGNOSIS — Z87891 Personal history of nicotine dependence: Secondary | ICD-10-CM | POA: Insufficient documentation

## 2018-09-16 DIAGNOSIS — J209 Acute bronchitis, unspecified: Secondary | ICD-10-CM | POA: Diagnosis not present

## 2018-09-16 DIAGNOSIS — L97929 Non-pressure chronic ulcer of unspecified part of left lower leg with unspecified severity: Secondary | ICD-10-CM | POA: Insufficient documentation

## 2018-09-16 DIAGNOSIS — Z85828 Personal history of other malignant neoplasm of skin: Secondary | ICD-10-CM | POA: Insufficient documentation

## 2018-09-16 DIAGNOSIS — D631 Anemia in chronic kidney disease: Secondary | ICD-10-CM | POA: Diagnosis not present

## 2018-09-16 DIAGNOSIS — Z9221 Personal history of antineoplastic chemotherapy: Secondary | ICD-10-CM | POA: Insufficient documentation

## 2018-09-16 DIAGNOSIS — R0602 Shortness of breath: Secondary | ICD-10-CM

## 2018-09-16 DIAGNOSIS — B9562 Methicillin resistant Staphylococcus aureus infection as the cause of diseases classified elsewhere: Secondary | ICD-10-CM | POA: Insufficient documentation

## 2018-09-16 DIAGNOSIS — I5022 Chronic systolic (congestive) heart failure: Secondary | ICD-10-CM | POA: Diagnosis not present

## 2018-09-16 DIAGNOSIS — K580 Irritable bowel syndrome with diarrhea: Secondary | ICD-10-CM | POA: Diagnosis not present

## 2018-09-16 DIAGNOSIS — Z801 Family history of malignant neoplasm of trachea, bronchus and lung: Secondary | ICD-10-CM | POA: Insufficient documentation

## 2018-09-16 DIAGNOSIS — Z800 Family history of malignant neoplasm of digestive organs: Secondary | ICD-10-CM | POA: Insufficient documentation

## 2018-09-16 DIAGNOSIS — Z853 Personal history of malignant neoplasm of breast: Secondary | ICD-10-CM | POA: Diagnosis not present

## 2018-09-16 DIAGNOSIS — Z8542 Personal history of malignant neoplasm of other parts of uterus: Secondary | ICD-10-CM | POA: Insufficient documentation

## 2018-09-16 DIAGNOSIS — I272 Pulmonary hypertension, unspecified: Secondary | ICD-10-CM | POA: Insufficient documentation

## 2018-09-16 DIAGNOSIS — I4891 Unspecified atrial fibrillation: Secondary | ICD-10-CM | POA: Diagnosis not present

## 2018-09-16 DIAGNOSIS — J441 Chronic obstructive pulmonary disease with (acute) exacerbation: Secondary | ICD-10-CM | POA: Diagnosis not present

## 2018-09-16 DIAGNOSIS — I10 Essential (primary) hypertension: Secondary | ICD-10-CM | POA: Diagnosis not present

## 2018-09-16 DIAGNOSIS — N183 Chronic kidney disease, stage 3 (moderate): Secondary | ICD-10-CM | POA: Diagnosis not present

## 2018-09-16 DIAGNOSIS — Z7901 Long term (current) use of anticoagulants: Secondary | ICD-10-CM | POA: Insufficient documentation

## 2018-09-16 DIAGNOSIS — I071 Rheumatic tricuspid insufficiency: Secondary | ICD-10-CM | POA: Insufficient documentation

## 2018-09-16 DIAGNOSIS — R9431 Abnormal electrocardiogram [ECG] [EKG]: Secondary | ICD-10-CM | POA: Insufficient documentation

## 2018-09-16 DIAGNOSIS — Z79899 Other long term (current) drug therapy: Secondary | ICD-10-CM | POA: Insufficient documentation

## 2018-09-16 DIAGNOSIS — N179 Acute kidney failure, unspecified: Secondary | ICD-10-CM | POA: Insufficient documentation

## 2018-09-16 DIAGNOSIS — E872 Acidosis: Secondary | ICD-10-CM | POA: Diagnosis not present

## 2018-09-16 DIAGNOSIS — Z808 Family history of malignant neoplasm of other organs or systems: Secondary | ICD-10-CM | POA: Insufficient documentation

## 2018-09-16 DIAGNOSIS — R000 Tachycardia, unspecified: Secondary | ICD-10-CM | POA: Diagnosis not present

## 2018-09-16 DIAGNOSIS — Z8042 Family history of malignant neoplasm of prostate: Secondary | ICD-10-CM | POA: Insufficient documentation

## 2018-09-16 DIAGNOSIS — Z8052 Family history of malignant neoplasm of bladder: Secondary | ICD-10-CM | POA: Insufficient documentation

## 2018-09-16 DIAGNOSIS — J449 Chronic obstructive pulmonary disease, unspecified: Secondary | ICD-10-CM | POA: Diagnosis not present

## 2018-09-16 DIAGNOSIS — I130 Hypertensive heart and chronic kidney disease with heart failure and stage 1 through stage 4 chronic kidney disease, or unspecified chronic kidney disease: Secondary | ICD-10-CM | POA: Insufficient documentation

## 2018-09-16 DIAGNOSIS — F419 Anxiety disorder, unspecified: Secondary | ICD-10-CM | POA: Insufficient documentation

## 2018-09-16 DIAGNOSIS — R059 Cough, unspecified: Secondary | ICD-10-CM | POA: Diagnosis present

## 2018-09-16 DIAGNOSIS — Z8249 Family history of ischemic heart disease and other diseases of the circulatory system: Secondary | ICD-10-CM | POA: Insufficient documentation

## 2018-09-16 DIAGNOSIS — E871 Hypo-osmolality and hyponatremia: Secondary | ICD-10-CM | POA: Diagnosis not present

## 2018-09-16 DIAGNOSIS — E785 Hyperlipidemia, unspecified: Secondary | ICD-10-CM | POA: Diagnosis not present

## 2018-09-16 DIAGNOSIS — J189 Pneumonia, unspecified organism: Secondary | ICD-10-CM | POA: Diagnosis present

## 2018-09-16 LAB — CBC
HEMATOCRIT: 41.8 % (ref 36.0–46.0)
HEMOGLOBIN: 13.5 g/dL (ref 12.0–15.0)
MCH: 31.8 pg (ref 26.0–34.0)
MCHC: 32.3 g/dL (ref 30.0–36.0)
MCV: 98.6 fL (ref 80.0–100.0)
Platelets: 258 10*3/uL (ref 150–400)
RBC: 4.24 MIL/uL (ref 3.87–5.11)
RDW: 17.2 % — ABNORMAL HIGH (ref 11.5–15.5)
WBC: 7.9 10*3/uL (ref 4.0–10.5)
nRBC: 0.6 % — ABNORMAL HIGH (ref 0.0–0.2)

## 2018-09-16 LAB — URINALYSIS, ROUTINE W REFLEX MICROSCOPIC
Bilirubin Urine: NEGATIVE
GLUCOSE, UA: NEGATIVE mg/dL
Hgb urine dipstick: NEGATIVE
Ketones, ur: NEGATIVE mg/dL
Nitrite: NEGATIVE
Protein, ur: NEGATIVE mg/dL
Specific Gravity, Urine: 1.01 (ref 1.005–1.030)
pH: 5 (ref 5.0–8.0)

## 2018-09-16 LAB — COMPREHENSIVE METABOLIC PANEL
ALBUMIN: 3.8 g/dL (ref 3.5–5.0)
ALT: 58 U/L — ABNORMAL HIGH (ref 0–44)
AST: 75 U/L — ABNORMAL HIGH (ref 15–41)
Alkaline Phosphatase: 91 U/L (ref 38–126)
Anion gap: 13 (ref 5–15)
BUN: 47 mg/dL — ABNORMAL HIGH (ref 8–23)
CHLORIDE: 97 mmol/L — AB (ref 98–111)
CO2: 24 mmol/L (ref 22–32)
Calcium: 7.9 mg/dL — ABNORMAL LOW (ref 8.9–10.3)
Creatinine, Ser: 2.16 mg/dL — ABNORMAL HIGH (ref 0.44–1.00)
GFR calc Af Amer: 23 mL/min — ABNORMAL LOW (ref >60)
GFR calc non Af Amer: 20 mL/min — ABNORMAL LOW (ref >60)
GLUCOSE: 125 mg/dL — AB (ref 70–99)
Potassium: 4.9 mmol/L (ref 3.5–5.1)
Sodium: 134 mmol/L — ABNORMAL LOW (ref 135–145)
Total Bilirubin: 1.1 mg/dL (ref 0.3–1.2)
Total Protein: 6.2 g/dL — ABNORMAL LOW (ref 6.5–8.1)

## 2018-09-16 LAB — LACTIC ACID, PLASMA
Lactic Acid, Venous: 2 mmol/L (ref 0.5–1.9)
Lactic Acid, Venous: 1.7 mmol/L (ref 0.5–1.9)

## 2018-09-16 LAB — I-STAT TROPONIN, ED: Troponin i, poc: 0.02 ng/mL (ref 0.00–0.08)

## 2018-09-16 LAB — PROTIME-INR
INR: 1.84
Prothrombin Time: 21 seconds — ABNORMAL HIGH (ref 11.4–15.2)

## 2018-09-16 LAB — I-STAT CREATININE, ED: Creatinine, Ser: 2.1 mg/dL — ABNORMAL HIGH (ref 0.44–1.00)

## 2018-09-16 MED ORDER — APIXABAN 2.5 MG PO TABS
2.5000 mg | ORAL_TABLET | Freq: Two times a day (BID) | ORAL | Status: DC
  Administered 2018-09-16 – 2018-09-17 (×2): 2.5 mg via ORAL
  Filled 2018-09-16 (×2): qty 1

## 2018-09-16 MED ORDER — BUDESONIDE 0.25 MG/2ML IN SUSP
0.2500 mg | Freq: Two times a day (BID) | RESPIRATORY_TRACT | Status: DC
  Administered 2018-09-17: 0.25 mg via RESPIRATORY_TRACT
  Filled 2018-09-16: qty 2

## 2018-09-16 MED ORDER — DM-GUAIFENESIN ER 30-600 MG PO TB12
1.0000 | ORAL_TABLET | Freq: Two times a day (BID) | ORAL | Status: DC
  Administered 2018-09-16 – 2018-09-17 (×2): 1 via ORAL
  Filled 2018-09-16 (×2): qty 1

## 2018-09-16 MED ORDER — ACETAMINOPHEN 325 MG PO TABS
650.0000 mg | ORAL_TABLET | Freq: Four times a day (QID) | ORAL | Status: DC | PRN
  Administered 2018-09-16: 650 mg via ORAL
  Filled 2018-09-16: qty 2

## 2018-09-16 MED ORDER — SULFAMETHOXAZOLE-TRIMETHOPRIM 800-160 MG PO TABS
1.0000 | ORAL_TABLET | Freq: Two times a day (BID) | ORAL | Status: DC
  Administered 2018-09-16 – 2018-09-17 (×2): 1 via ORAL
  Filled 2018-09-16 (×2): qty 1

## 2018-09-16 MED ORDER — SODIUM CHLORIDE 0.9 % IV SOLN
INTRAVENOUS | Status: AC
  Administered 2018-09-16: 18:00:00 via INTRAVENOUS

## 2018-09-16 MED ORDER — CARVEDILOL 3.125 MG PO TABS
3.1250 mg | ORAL_TABLET | Freq: Two times a day (BID) | ORAL | Status: DC
  Administered 2018-09-16 – 2018-09-17 (×2): 3.125 mg via ORAL
  Filled 2018-09-16 (×2): qty 1

## 2018-09-16 MED ORDER — SODIUM CHLORIDE 0.9 % IV BOLUS
500.0000 mL | Freq: Once | INTRAVENOUS | Status: AC
  Administered 2018-09-16: 500 mL via INTRAVENOUS

## 2018-09-16 MED ORDER — ALPRAZOLAM 0.25 MG PO TABS
0.5000 mg | ORAL_TABLET | Freq: Two times a day (BID) | ORAL | Status: DC | PRN
  Administered 2018-09-16 – 2018-09-17 (×2): 0.5 mg via ORAL
  Filled 2018-09-16 (×2): qty 2

## 2018-09-16 MED ORDER — DIPHENOXYLATE-ATROPINE 2.5-0.025 MG PO TABS
1.0000 | ORAL_TABLET | Freq: Three times a day (TID) | ORAL | Status: DC
  Administered 2018-09-16 – 2018-09-17 (×2): 1 via ORAL
  Filled 2018-09-16 (×2): qty 1

## 2018-09-16 MED ORDER — SODIUM CHLORIDE 0.9% FLUSH
3.0000 mL | Freq: Once | INTRAVENOUS | Status: AC
  Administered 2018-09-16: 3 mL via INTRAVENOUS

## 2018-09-16 MED ORDER — LORATADINE 10 MG PO TABS
10.0000 mg | ORAL_TABLET | Freq: Every day | ORAL | Status: DC
  Administered 2018-09-16 – 2018-09-17 (×2): 10 mg via ORAL
  Filled 2018-09-16 (×2): qty 1

## 2018-09-16 MED ORDER — ACETAMINOPHEN 650 MG RE SUPP: 650.0000 mg | Freq: Four times a day (QID) | RECTAL | Status: DC | PRN

## 2018-09-16 NOTE — Progress Notes (Signed)
DEEANN SERVIDIO is a 85 y.o. female patient admitted from ED awake, alert - oriented  X 4 - no acute distress noted.  VSS - Blood pressure 108/75, pulse 77, temperature 97.7 F (36.5 C), temperature source Oral, resp. rate 18, height 5\' 3"  (1.6 m), weight 52.6 kg, SpO2 93 %.    IV in place, occlusive dsg intact without redness.  Orientation to room, and floor completed with information packet given to patient/family.  Patient declined safety video at this time.  Admission INP armband ID verified with patient/family, and in place.   SR up x 2, fall assessment complete, with patient and family able to verbalize understanding of risk associated with falls, and verbalized understanding to call nsg before up out of bed.  Call light within reach, patient able to voice, and demonstrate understanding.  Skin, clean-dry- with a wound on her left shin and abrasion on right leg  No evidence of skin break down noted on exam.     Will cont to eval and treat per MD orders.  Luci Bank, RN 09/16/2018 6:23 PM

## 2018-09-16 NOTE — ED Triage Notes (Signed)
Seen at [facility] office today sent to the ED for evaluation of shortness of breath rule out pneumonia, tachycardia, and wound check for her left lower extremity from 2 months ago and currently taking antibiotics. States having intermittent diarrhea with antibiotics.

## 2018-09-16 NOTE — Progress Notes (Signed)
Mia Contreras is a 85 y.o. female with the following history as recorded in EpicCare:  Patient Active Problem List   Diagnosis Date Noted  . Genetic testing 07/22/2018  . Personal history of breast cancer 07/01/2018  . Family history of prostate cancer   . Family history of skin cancer   . Family history of lymphoma   . Family history of brain tumor   . Family history of lung cancer   . Severe tricuspid regurgitation 06/19/2018  . Malignant neoplasm of upper-inner quadrant of left breast in female, estrogen receptor positive ([place]) 05/10/2018  . Grief 02/12/2018  . Psoriasis 09/14/2017  . Liver disorder 09/13/2017  . Hematoma of leg 08/24/2017  . Myalgia 06/21/2017  . Rash and nonspecific skin eruption 05/22/2017  . Arthralgia 04/13/2017  . Vaginitis, atrophic 03/06/2017  . Symptomatic bradycardia   . Acute systolic CHF (congestive heart failure) ([place])   . Hyperkalemia 06/06/2016  . Skin tear of left forearm without complication 90/24/0973  . HCAP (healthcare-associated pneumonia) 01/30/2016  . Chronic anticoagulation-Eliquis 01/30/2016  . Hypotension-currently asymptomatic 01/30/2016  . Malnutrition of moderate degree 01/27/2016  . Falls 01/26/2016  . Hypoxia 01/26/2016  . Chronic CHF (congestive heart failure) ([place]) 01/26/2016  . Atrial fibrillation ([place]) 01/11/2016  . Acute kidney injury superimposed on chronic kidney disease ([place]) 12/14/2015  . CRF (chronic renal failure) 12/14/2015  . IBS (irritable bowel syndrome) 12/14/2015  . Open leg wound 11/08/2015  . Hiatal hernia 05/12/2015  . Subconjunctival hemorrhage 08/07/2014  . Nausea without vomiting 07/15/2014  . Noninfected skin tear of right leg 07/03/2014  . Hypoglycemia 12/12/2013  . Chronic systolic heart failure ([place]) 08/22/2013  . NICM (nonischemic cardiomyopathy) ([place]) 08/22/2013  . Diarrhea 10/01/2012  . Dyspnea on exertion 06/27/2012  . Asthmatic bronchitis 06/27/2012  . UTI (urinary tract infection)  11/02/2011  . Postoperative anemia due to acute blood loss 11/01/2011  . COPD (chronic obstructive pulmonary disease) ([place]) 08/16/2011  . Cough 08/08/2011  . Preop exam for internal medicine 08/02/2011  . Cerumen impaction 08/02/2011  . Bruit 04/19/2011  . Arrhythmia 03/29/2011  . Neoplasm of uncertain behavior of skin 09/21/2010  . PREMATURE ATRIAL CONTRACTIONS 09/21/2010  . CYSTITIS 09/21/2010  . Thyrotoxicosis 05/23/2010  . GOITER, MULTINODULAR 05/06/2010  . TOBACCO USE, QUIT 06/14/2009  . ELECTROCARDIOGRAM, ABNORMAL 04/02/2009  . SKIN CANCER, HX OF 11/12/2008  . CARPAL TUNNEL SYNDROME, RIGHT 01/11/2008  . SCOLIOSIS 01/11/2008  . VENOUS INSUFFICIENCY 09/05/2007  . Edema 09/05/2007  . Anemia in chronic kidney disease 08/20/2007  . DIVERTICULOSIS, COLON 08/20/2007  . CELLULITIS, LEG, RIGHT 08/20/2007  . Osteoporosis 08/20/2007  . Open wound of knee, leg, and ankle 07/03/2007  . Anxiety 03/16/2007  . Essential hypertension 03/16/2007  . Osteoarthritis 03/16/2007  . hx: breast cancer, left, infiltrating ductal 03/16/2007    Current Outpatient Medications  Medication Sig Dispense Refill  . ALPRAZolam (XANAX) 0.5 MG tablet Take 1 tablet (0.5 mg total) by mouth 2 (two) times daily as needed for anxiety. 60 tablet 3  . apixaban (ELIQUIS) 2.5 MG TABS tablet Take 1 tablet (2.5 mg total) by mouth 2 (two) times daily. 60 tablet 11  . carvedilol (COREG) 3.125 MG tablet Take 1 tablet (3.125 mg total) by mouth 2 (two) times daily with a meal. 60 tablet 11  . coal tar (NEUTROGENA T-GEL) 0.5 % shampoo Apply topically 2 (two) times a week.     Marland Kitchen ketoconazole (NIZORAL) 2 % shampoo Apply 1 application topically 2 (two) times a week.    Marland Kitchen  Polyethyl Glycol-Propyl Glycol (SYSTANE ULTRA PF OP) Place 2 drops into both eyes as needed (for dry eyes).    . potassium chloride SA (K-DUR,KLOR-CON) 20 MEQ tablet Take 1 tablet (20 mEq total) by mouth daily. 30 tablet 3  . sulfamethoxazole-trimethoprim  (BACTRIM DS) 800-160 MG tablet Take 1 tablet by mouth 2 (two) times daily.    Marland Kitchen torsemide (DEMADEX) 20 MG tablet TAKE TWO TABLETS BY MOUTH DAILY 180 tablet 5  . triamcinolone ointment (KENALOG) 0.1 % Apply 1 application topically 2 (two) times daily. Use qd -bid 80 g 2  . vitamin C (ASCORBIC ACID) 500 MG tablet Take 1 tablet (500 mg total) by mouth daily. 100 tablet 3   No current facility-administered medications for this visit.     Allergies: Cefuroxime; Celecoxib; Deltasone [prednisone]; Ranitidine; Spironolactone; Tapazole [methimazole]; Tramadol hcl; and Entresto [sacubitril-valsartan]  Past Medical History:  Diagnosis Date  . AKI (acute kidney injury) ([place]) 01/2016  . ANEMIA-NOS 08/20/2007  . ANXIETY 03/16/2007  . BREAST CANCER, HX OF 03/16/2007  . CARPAL TUNNEL SYNDROME, RIGHT 01/11/2008  . Cataract    floaters  . CELLULITIS, LEG, RIGHT 08/20/2007  . CHF (congestive heart failure) ([place])   . Collagenous colitis   . Congestive heart failure with right ventricular systolic dysfunction ([place]) 06/19/2018  . COPD (chronic obstructive pulmonary disease) ([place])   . DIVERTICULOSIS, COLON 08/20/2007  . Dyspnea    on exertion  . Dysrhythmia    atrial fib  . Family history of brain tumor   . Family history of lung cancer   . Family history of lymphoma   . Family history of prostate cancer   . Family history of skin cancer   . GOITER, MULTINODULAR 05/06/2010   Dr [name]  . HYPERLIPIDEMIA 08/20/2007  . HYPERTENSION 03/16/2007  . HYPERTHYROIDISM 05/23/2010  . IBS (irritable bowel syndrome)   . OSTEOARTHRITIS 03/16/2007  . OSTEOPOROSIS 08/20/2007  . Personal history of breast cancer 07/01/2018  . Pneumonia 07/2011   took abx for several weeks  . PREMATURE ATRIAL CONTRACTIONS 09/21/2010  . SCOLIOSIS 01/11/2008  . Severe tricuspid regurgitation 06/19/2018  . SKIN CANCER, HX OF 11/12/2008    R cheek 2010, L Cheek 2011 Dr. Tonia Brooms  . Thyroid nodule   . Uterine cancer ([place])   . VENOUS  INSUFFICIENCY 09/05/2007    Past Surgical History:  Procedure Laterality Date  . ABDOMINAL HYSTERECTOMY    . APPENDECTOMY  1972  . BREAST LUMPECTOMY Left 2001  . BREAST LUMPECTOMY WITH RADIOACTIVE SEED LOCALIZATION Left 06/11/2018   Procedure: LEFT BREAST LUMPECTOMY WITH BRACKETED RADIOACTIVE SEED LOCALIZATION;  Surgeon: Fanny Skates, MD;  Location: [place];  Service: General;  Laterality: Left;  . CARPAL TUNNEL RELEASE    . CATARACT EXTRACTION    . EYE SURGERY     cataract ext  . HERNIA REPAIR    . MASTECTOMY, PARTIAL Left   . SKIN CANCER EXCISION     face-Dr. Bing Plume  . TOTAL KNEE ARTHROPLASTY    . TOTAL KNEE ARTHROPLASTY Right 2010   Dr Para March  . TOTAL KNEE ARTHROPLASTY  10/30/2011   Procedure: TOTAL KNEE ARTHROPLASTY;  Surgeon: Lorn Junes, MD;  Location: [place];  Service: Orthopedics;  Laterality: Left;     . XRT     chemo, surgery    Family History  Problem Relation Age of Onset  . Dementia Mother   . Heart disease Mother   . Mental retardation Mother   . Hypertension Mother   . Alzheimer's  disease Mother   . Heart attack Father   . Alcohol abuse Father   . Prostate cancer Brother        dx >50  . Bladder Cancer Brother        dx >50  . Diabetes Other   . Brain cancer Maternal Aunt 70  . Brain cancer Maternal Uncle 40  . Lung cancer Paternal Uncle   . Heart attack Maternal Grandmother   . Heart attack Maternal Grandfather   . Lymphoma Other 38       [facility]  . Skin cancer Other   . Colon cancer Neg Hx   . Anesthesia problems Neg Hx     Social History   Tobacco Use  . Smoking status: Former Smoker    Last attempt to quit: 12/20/1980    Years since quitting: 37.7  . Smokeless tobacco: Never Used  Substance Use Topics  . Alcohol use: Not Currently    Subjective:  Patient presents with concerns for cough x 1 week; has been taking Bactrim DS per wound clinic- started Saturday am; accompanied by neighbor who helps provider history; Complaining of worsening  cough/ shortness of breath/ weakness; was started on Bactrim DS on Saturday- has had worsening diarrhea for the past 48 hours; Known history of A. Fib- rate is typically well controlled/ pulse today at 120;     Objective:  Vitals:   09/16/18 1053  BP: 102/62  Pulse: (!) 120  Temp: 97.8 F (36.6 C)  TempSrc: Oral  SpO2: 94%  Height: 5\' 3"  (1.6 m)    General: Well developed, well nourished, in no acute distress  Skin : Warm and dry.  Head: Normocephalic and atraumatic  Eyes: Sclera and conjunctiva clear; pupils round and reactive to light; extraocular movements intact  Ears: External normal; canals clear; tympanic membranes normal  Oropharynx: Pink, supple. No suspicious lesions  Neck: Supple without thyromegaly, adenopathy  Lungs: Respirations unlabored; coarse breath sounds- ? Pneumonia RLL CVS exam: irregularly irregular rhythm with rate 120.  Neurologic: Alert and oriented; speech intact; face symmetrical; moves all extremities well; CNII-XII intact without focal deficit   Assessment:  1. Shortness of breath   2. Tachycardia   3. Cough     Plan:  Based on appearance in office and physical exam, feel that patient would benefit from ER evaluation; patient and caregiver agree- sent to [facility]  ER; intake RN at [facility] notified; follow-up to be determined.  No follow-ups on file.  No orders of the defined types were placed in this encounter.   Requested Prescriptions    No prescriptions requested or ordered in this encounter

## 2018-09-16 NOTE — ED Provider Notes (Signed)
[facility] EMERGENCY DEPARTMENT Provider Note   CSN: [id number] Arrival date & time: 09/16/18  1130     History   Chief Complaint Chief Complaint  Patient presents with  . Tachycardia  . Shortness of Breath  . Wound Check    HPI Mia Contreras is a 85 y.o. female.  HPI   Presents from PCP office Being treated at chronic wound center, cellulitis left leg 1/24 bactrim 1/21 doxycycline Diarrhea started 1/21, every time eat, at least 3 times a day. No blood. Cough, started last Thursday 10 days ago, productive of clear sputum No fever Shortness of breath, has been since coughing.  No orthopnea or PND. Takes xanax to help calm at times No congestion, no sore throat.   Past Medical History:  Diagnosis Date  . AKI (acute kidney injury) ([place]) 01/2016  . ANEMIA-NOS 08/20/2007  . ANXIETY 03/16/2007  . BREAST CANCER, HX OF 03/16/2007  . CARPAL TUNNEL SYNDROME, RIGHT 01/11/2008  . Cataract    floaters  . CELLULITIS, LEG, RIGHT 08/20/2007  . CHF (congestive heart failure) ([place])   . Collagenous colitis   . Congestive heart failure with right ventricular systolic dysfunction ([place]) 06/19/2018  . COPD (chronic obstructive pulmonary disease) ([place])   . DIVERTICULOSIS, COLON 08/20/2007  . Dyspnea    on exertion  . Dysrhythmia    atrial fib  . Family history of brain tumor   . Family history of lung cancer   . Family history of lymphoma   . Family history of prostate cancer   . Family history of skin cancer   . GOITER, MULTINODULAR 05/06/2010   Dr [name]  . HYPERLIPIDEMIA 08/20/2007  . HYPERTENSION 03/16/2007  . HYPERTHYROIDISM 05/23/2010  . IBS (irritable bowel syndrome)   . OSTEOARTHRITIS 03/16/2007  . OSTEOPOROSIS 08/20/2007  . Personal history of breast cancer 07/01/2018  . Pneumonia 07/2011   took abx for several weeks  . PREMATURE ATRIAL CONTRACTIONS 09/21/2010  . SCOLIOSIS 01/11/2008  . Severe tricuspid regurgitation 06/19/2018  . SKIN CANCER, HX OF  11/12/2008    R cheek 2010, L Cheek 2011 Dr. Tonia Brooms  . Thyroid nodule   . Uterine cancer ([place])   . VENOUS INSUFFICIENCY 09/05/2007    Patient Active Problem List   Diagnosis Date Noted  . Dehydration 09/16/2018  . AKI (acute kidney injury) ([place]) 09/16/2018  . Genetic testing 07/22/2018  . Personal history of breast cancer 07/01/2018  . Family history of prostate cancer   . Family history of skin cancer   . Family history of lymphoma   . Family history of brain tumor   . Family history of lung cancer   . Severe tricuspid regurgitation 06/19/2018  . Malignant neoplasm of upper-inner quadrant of left breast in female, estrogen receptor positive ([place]) 05/10/2018  . Grief 02/12/2018  . Psoriasis 09/14/2017  . Liver disorder 09/13/2017  . Hematoma of leg 08/24/2017  . Myalgia 06/21/2017  . Rash and nonspecific skin eruption 05/22/2017  . Arthralgia 04/13/2017  . Vaginitis, atrophic 03/06/2017  . Symptomatic bradycardia   . Acute systolic CHF (congestive heart failure) ([place])   . Hyperkalemia 06/06/2016  . Skin tear of left forearm without complication 06/14/8526  . HCAP (healthcare-associated pneumonia) 01/30/2016  . Chronic anticoagulation-Eliquis 01/30/2016  . Hypotension-currently asymptomatic 01/30/2016  . Malnutrition of moderate degree 01/27/2016  . Falls 01/26/2016  . Hypoxia 01/26/2016  . Chronic CHF (congestive heart failure) ([place]) 01/26/2016  . Atrial fibrillation ([place]) 01/11/2016  . Acute kidney injury  superimposed on chronic kidney disease ([place]) 12/14/2015  . CRF (chronic renal failure) 12/14/2015  . IBS (irritable bowel syndrome) 12/14/2015  . Open leg wound 11/08/2015  . Hiatal hernia 05/12/2015  . Subconjunctival hemorrhage 08/07/2014  . Nausea without vomiting 07/15/2014  . Noninfected skin tear of right leg 07/03/2014  . Hypoglycemia 12/12/2013  . Chronic systolic heart failure ([place]) 08/22/2013  . NICM (nonischemic cardiomyopathy) ([place]) 08/22/2013  .  Diarrhea 10/01/2012  . Dyspnea on exertion 06/27/2012  . Asthmatic bronchitis 06/27/2012  . UTI (urinary tract infection) 11/02/2011  . Postoperative anemia due to acute blood loss 11/01/2011  . COPD (chronic obstructive pulmonary disease) ([place]) 08/16/2011  . Cough 08/08/2011  . Preop exam for internal medicine 08/02/2011  . Cerumen impaction 08/02/2011  . Bruit 04/19/2011  . Arrhythmia 03/29/2011  . Neoplasm of uncertain behavior of skin 09/21/2010  . PREMATURE ATRIAL CONTRACTIONS 09/21/2010  . CYSTITIS 09/21/2010  . Thyrotoxicosis 05/23/2010  . GOITER, MULTINODULAR 05/06/2010  . TOBACCO USE, QUIT 06/14/2009  . ELECTROCARDIOGRAM, ABNORMAL 04/02/2009  . SKIN CANCER, HX OF 11/12/2008  . CARPAL TUNNEL SYNDROME, RIGHT 01/11/2008  . SCOLIOSIS 01/11/2008  . VENOUS INSUFFICIENCY 09/05/2007  . Edema 09/05/2007  . Anemia in chronic kidney disease 08/20/2007  . DIVERTICULOSIS, COLON 08/20/2007  . CELLULITIS, LEG, RIGHT 08/20/2007  . Osteoporosis 08/20/2007  . Open wound of knee, leg, and ankle 07/03/2007  . Anxiety 03/16/2007  . Essential hypertension 03/16/2007  . Osteoarthritis 03/16/2007  . hx: breast cancer, left, infiltrating ductal 03/16/2007    Past Surgical History:  Procedure Laterality Date  . ABDOMINAL HYSTERECTOMY    . APPENDECTOMY  1972  . BREAST LUMPECTOMY Left 2001  . BREAST LUMPECTOMY WITH RADIOACTIVE SEED LOCALIZATION Left 06/11/2018   Procedure: LEFT BREAST LUMPECTOMY WITH BRACKETED RADIOACTIVE SEED LOCALIZATION;  Surgeon: Fanny Skates, MD;  Location: [facility];  Service: General;  Laterality: Left;  . CARPAL TUNNEL RELEASE    . CATARACT EXTRACTION    . EYE SURGERY     cataract ext  . HERNIA REPAIR    . MASTECTOMY, PARTIAL Left   . SKIN CANCER EXCISION     face-Dr. Bing Plume  . TOTAL KNEE ARTHROPLASTY    . TOTAL KNEE ARTHROPLASTY Right 2010   Dr Para March  . TOTAL KNEE ARTHROPLASTY  10/30/2011   Procedure: TOTAL KNEE ARTHROPLASTY;  Surgeon: Lorn Junes, MD;   Location: [employer] [facility];  Service: Orthopedics;  Laterality: Left;     . XRT     chemo, surgery     OB History   No obstetric history on file.      Home Medications    Prior to Admission medications   Medication Sig Start Date End Date Taking? Authorizing Provider  ALPRAZolam Duanne Moron) 0.5 MG tablet Take 1 tablet (0.5 mg total) by mouth 2 (two) times daily as needed for anxiety. 07/02/18  Yes Plotnikov, Evie Lacks, MD  apixaban (ELIQUIS) 2.5 MG TABS tablet Take 1 tablet (2.5 mg total) by mouth 2 (two) times daily. 03/12/18  Yes Bensimhon, Shaune Pascal, MD  carvedilol (COREG) 3.125 MG tablet Take 1 tablet (3.125 mg total) by mouth 2 (two) times daily with a meal. 02/11/18  Yes Josue Hector, MD  coal tar (NEUTROGENA T-GEL) 0.5 % shampoo Apply topically 2 (two) times a week.    Yes [provider]  ketoconazole (NIZORAL) 2 % shampoo Apply 1 application topically 2 (two) times a week.   Yes [provider]  Polyethyl Glycol-Propyl Glycol (SYSTANE ULTRA PF OP)  Place 2 drops into both eyes as needed (for dry eyes).   Yes [provider]  potassium chloride SA (K-DUR,KLOR-CON) 20 MEQ tablet Take 1 tablet (20 mEq total) by mouth daily. 08/01/18  Yes Burtis Junes, NP  sulfamethoxazole-trimethoprim (BACTRIM DS) 800-160 MG tablet Take 1 tablet by mouth 2 (two) times daily.   Yes [provider]  torsemide (DEMADEX) 20 MG tablet TAKE TWO TABLETS BY MOUTH DAILY Patient taking differently: Take 40 mg by mouth daily.  04/30/18  Yes Plotnikov, Evie Lacks, MD  triamcinolone ointment (KENALOG) 0.1 % Apply 1 application topically 2 (two) times daily. Use qd -bid Patient taking differently: Apply 1 application topically 2 (two) times daily as needed (on affected area of skin).  05/06/18  Yes Plotnikov, Evie Lacks, MD  vitamin C (ASCORBIC ACID) 500 MG tablet Take 1 tablet (500 mg total) by mouth daily. 03/14/16  Yes Plotnikov, Evie Lacks, MD    Family History Family History  Problem  Relation Age of Onset  . Dementia Mother   . Heart disease Mother   . Mental retardation Mother   . Hypertension Mother   . Alzheimer's disease Mother   . Heart attack Father   . Alcohol abuse Father   . Prostate cancer Brother        dx >50  . Bladder Cancer Brother        dx >50  . Diabetes Other   . Brain cancer Maternal Aunt 70  . Brain cancer Maternal Uncle 40  . Lung cancer Paternal Uncle   . Heart attack Maternal Grandmother   . Heart attack Maternal Grandfather   . Lymphoma Other 38       NH  . Skin cancer Other   . Colon cancer Neg Hx   . Anesthesia problems Neg Hx     Social History Social History   Tobacco Use  . Smoking status: Former Smoker    Last attempt to quit: 12/20/1980    Years since quitting: 37.7  . Smokeless tobacco: Never Used  Substance Use Topics  . Alcohol use: Not Currently  . Drug use: No     Allergies   Cefuroxime; Celecoxib; Deltasone [prednisone]; Ranitidine; Spironolactone; Tapazole [methimazole]; Tramadol hcl; and Entresto [sacubitril-valsartan]   Review of Systems Review of Systems  Constitutional: Positive for appetite change and fatigue. Negative for fever.  HENT: Negative for sore throat.   Eyes: Negative for visual disturbance.  Respiratory: Positive for cough and shortness of breath.   Cardiovascular: Positive for palpitations. Negative for chest pain.  Gastrointestinal: Positive for nausea. Negative for abdominal pain and vomiting.  Genitourinary: Negative for difficulty urinating.  Musculoskeletal: Negative for back pain and neck pain.  Skin: Positive for rash and wound.  Neurological: Negative for syncope and headaches.     Physical Exam Updated Vital Signs BP 90/63 (BP Location: Left Arm)   Pulse 75   Temp (!) 97.5 F (36.4 C) (Oral)   Resp 18   Ht 5\' 3"  (1.6 m)   Wt 53.7 kg   SpO2 90%   BMI 20.97 kg/m   Physical Exam Vitals signs and nursing note reviewed.  Constitutional:      General: She is not in  acute distress.    Appearance: She is well-developed. She is not diaphoretic.  HENT:     Head: Normocephalic and atraumatic.  Eyes:     Conjunctiva/sclera: Conjunctivae normal.  Neck:     Musculoskeletal: Normal range of motion.  Cardiovascular:  Rate and Rhythm: Normal rate. Rhythm regularly irregular.     Heart sounds: Normal heart sounds. No murmur. No friction rub. No gallop.   Pulmonary:     Effort: Pulmonary effort is normal. No respiratory distress.     Breath sounds: Normal breath sounds. No wheezing or rales.  Abdominal:     General: There is no distension.     Palpations: Abdomen is soft.     Tenderness: There is no abdominal tenderness. There is no guarding.  Musculoskeletal:        General: No tenderness.     Comments: Removed chronic wound dressings, edema compressed up to knees, with proximal edema   Skin:    General: Skin is warm and dry.     Findings: No erythema or rash.     Comments: Left anterior lower leg with 4x4cm area wound, no exudate. Mild erythema surrounding wound, no significant cellulitis  Neurological:     Mental Status: She is alert and oriented to person, place, and time.      ED Treatments / Results  Labs (all labs ordered are listed, but only abnormal results are displayed) Labs Reviewed  COMPREHENSIVE METABOLIC PANEL - Abnormal; Notable for the following components:      Result Value   Sodium 134 (*)    Chloride 97 (*)    Glucose, Bld 125 (*)    BUN 47 (*)    Creatinine, Ser 2.16 (*)    Calcium 7.9 (*)    Total Protein 6.2 (*)    AST 75 (*)    ALT 58 (*)    GFR calc non Af Amer 20 (*)    GFR calc Af Amer 23 (*)    All other components within normal limits  LACTIC ACID, PLASMA - Abnormal; Notable for the following components:   Lactic Acid, Venous 2.0 (*)    All other components within normal limits  URINALYSIS, ROUTINE W REFLEX MICROSCOPIC - Abnormal; Notable for the following components:   APPearance HAZY (*)    Leukocytes,  UA MODERATE (*)    Bacteria, UA RARE (*)    Non Squamous Epithelial 0-5 (*)    All other components within normal limits  PROTIME-INR - Abnormal; Notable for the following components:   Prothrombin Time 21.0 (*)    All other components within normal limits  CBC - Abnormal; Notable for the following components:   RDW 17.2 (*)    nRBC 0.6 (*)    All other components within normal limits  I-STAT CREATININE, ED - Abnormal; Notable for the following components:   Creatinine, Ser 2.10 (*)    All other components within normal limits  CULTURE, BLOOD (ROUTINE X 2)  CULTURE, BLOOD (ROUTINE X 2)  LACTIC ACID, PLASMA  BASIC METABOLIC PANEL  I-STAT TROPONIN, ED    EKG EKG Interpretation  Date/Time:  Monday September 16 2018 12:09:04 EST Ventricular Rate:  120 PR Interval:    QRS Duration: 114 QT Interval:  338 QTC Calculation: 477 R Axis:   -91 Text Interpretation:  Atrial fibrillation with RVR Right superior axis deviation Inferior infarct , age undetermined Anteroseptal infarct , age undetermined ST & T wave abnormality, consider lateral ischemia Abnormal ECG Since prior ECG, rate has increased Confirmed by Gareth Morgan [phone]) on 09/16/2018 12:33:38 PM   Radiology Dg Chest 2 View  Result Date: 09/16/2018 CLINICAL DATA:  Cough and shortness of Breath EXAM: CHEST - 2 VIEW COMPARISON:  06/18/2018 FINDINGS: Cardiac shadow is enlarged. Aortic calcifications  are again identified and stable. The lungs are well aerated bilaterally. Focal eventration of the left hemidiaphragm is noted and stable. No focal infiltrate or sizable effusion. No acute bony abnormality noted. IMPRESSION: No acute abnormality noted. Electronically Signed   By: Inez Catalina M.D.   On: 09/16/2018 12:47    Procedures Procedures (including critical care time)  Medications Ordered in ED Medications  apixaban (ELIQUIS) tablet 2.5 mg (2.5 mg Oral Given 09/16/18 2113)  carvedilol (COREG) tablet 3.125 mg (3.125 mg Oral  Given 09/16/18 2129)  ALPRAZolam (XANAX) tablet 0.5 mg (0.5 mg Oral Given 09/16/18 2028)  sulfamethoxazole-trimethoprim (BACTRIM DS,SEPTRA DS) 800-160 MG per tablet 1 tablet (1 tablet Oral Given 09/16/18 2113)  dextromethorphan-guaiFENesin (MUCINEX DM) 30-600 MG per 12 hr tablet 1 tablet (1 tablet Oral Given 09/16/18 2113)  budesonide (PULMICORT) nebulizer solution 0.25 mg (0.25 mg Nebulization Not Given 09/16/18 2308)  0.9 %  sodium chloride infusion ( Intravenous New Bag/Given 09/16/18 1818)  diphenoxylate-atropine (LOMOTIL) 2.5-0.025 MG per tablet 1 tablet (1 tablet Oral Given 09/16/18 2113)  acetaminophen (TYLENOL) tablet 650 mg (650 mg Oral Given 09/16/18 2028)    Or  acetaminophen (TYLENOL) suppository 650 mg ( Rectal See Alternative 09/16/18 2028)  loratadine (CLARITIN) tablet 10 mg (10 mg Oral Given 09/16/18 2129)  sodium chloride flush (NS) 0.9 % injection 3 mL (3 mLs Intravenous Given 09/16/18 1314)  sodium chloride 0.9 % bolus 500 mL (0 mLs Intravenous Stopped 09/16/18 1750)     Initial Impression / Assessment and Plan / ED Course  I have reviewed the triage vital signs and the nursing notes.  Pertinent labs & imaging results that were available during my care of the patient were reviewed by me and considered in my medical decision making (see chart for details).     85 year old female with a history of COPD, CHF, atrial fibrillation on Eliquis, hypertension, hyperlipidemia, chronic wound, who presents from PCPs office with concern for atrial fibrillation with RVR, cough, recently diagnosed cellulitis of left leg, and diarrhea.  Differential diagnosis for diarrhea include side effect of her antibiotics, or possible infectious etiology.  Ordered stool studies, however patient without diarrhea during her time in the emergency department.  X-ray done given cough shows no sign of pulmonary edema or pneumonia.  History does not suggest congestive heart failure exacerbation.  Atrial  fibrillation with RVR likely in the setting of acute illness and dehydration.  Labs are significant for a mild acute kidney injury, likely in the setting of dehydration from diarrhea.  Patient was given 500 cc of normal saline for hydration.  Her heart rate has improved in the emergency department.  She is on antibiotics for cellulitis of the left lower extremity.  Chronic wound dressing removed, and patient without significant continuing cellulitis.    Patient admitted with concern for mild acute kidney injury in the setting of diarrhea, and atrial fibrillation in setting of dehydration.  Suspect her cough is likely in the setting of viral infection and bronchitis, with some mild wheezing noted on exam.  No significant dyspnea, hypoxia, work of breathing.  Final Clinical Impressions(s) / ED Diagnoses   Final diagnoses:  AKI (acute kidney injury) ([place])  Bronchitis, chronic obstructive w acute bronchitis ([place])  Diarrhea, unspecified type    ED Discharge Orders    None       Gareth Morgan, MD 09/16/18 2356

## 2018-09-16 NOTE — ED Notes (Signed)
Patient transported to X-ray 

## 2018-09-16 NOTE — H&P (Addendum)
History and Physical    Mia Contreras  TIW:[id number]  DOB: 04-24-1929  DOA: 09/16/2018 PCP: Cassandria Anger, MD   Patient coming from: home  Chief Complaint: sent from PCP's office  HPI: Mia Contreras is a 85 y.o. female with medical history of breast CA, HFrEF, A-fib on DOAC, COPD not on inhalers, left leg ulcer under the care of Dr Dellia Nims who comes in from her doctor's office to evaluate for pneumonia. She has had a cough since 1/16 which is currently productive of clear sputum. No fevers. She feels her cough has not gotten better but is also not worse. Not really short of breath on exertion. She has also had an ulcer on her left leg that his being managed at the wound care center. The wound grew  Out MRSA and she was placed on Doxycyline on Tuesday and began to have diarrhea on Wednesday. The medication was changed to Septra but she continues to have about 3-4 loose BMs a day. No abdominal pain or nausea or vomiting.  No blood in the stool.   ED Course: A-fib with HR in 120s initially, improved without intervention to 80s Cr 2.16 up from her baseline of about 1.4.  CXR negative for infiltrates, WBC normal.    Review of Systems:  All other systems reviewed and apart from HPI, are negative.  Past Medical History:  Diagnosis Date  . AKI (acute kidney injury) ([place]) 01/2016  . ANEMIA-NOS 08/20/2007  . ANXIETY 03/16/2007  . BREAST CANCER, HX OF 03/16/2007  . CARPAL TUNNEL SYNDROME, RIGHT 01/11/2008  . Cataract    floaters  . CELLULITIS, LEG, RIGHT 08/20/2007  . CHF (congestive heart failure) ([place])   . Collagenous colitis   . Congestive heart failure with right ventricular systolic dysfunction ([place]) 06/19/2018  . COPD (chronic obstructive pulmonary disease) ([place])   . DIVERTICULOSIS, COLON 08/20/2007  . Dyspnea    on exertion  . Dysrhythmia    atrial fib  . Family history of brain tumor   . Family history of lung cancer   . Family history of lymphoma   . Family history  of prostate cancer   . Family history of skin cancer   . GOITER, MULTINODULAR 05/06/2010   Dr [name]  . HYPERLIPIDEMIA 08/20/2007  . HYPERTENSION 03/16/2007  . HYPERTHYROIDISM 05/23/2010  . IBS (irritable bowel syndrome)   . OSTEOARTHRITIS 03/16/2007  . OSTEOPOROSIS 08/20/2007  . Personal history of breast cancer 07/01/2018  . Pneumonia 07/2011   took abx for several weeks  . PREMATURE ATRIAL CONTRACTIONS 09/21/2010  . SCOLIOSIS 01/11/2008  . Severe tricuspid regurgitation 06/19/2018  . SKIN CANCER, HX OF 11/12/2008    R cheek 2010, L Cheek 2011 Dr. Tonia Brooms  . Thyroid nodule   . Uterine cancer ([place])   . VENOUS INSUFFICIENCY 09/05/2007    Past Surgical History:  Procedure Laterality Date  . ABDOMINAL HYSTERECTOMY    . APPENDECTOMY  1972  . BREAST LUMPECTOMY Left 2001  . BREAST LUMPECTOMY WITH RADIOACTIVE SEED LOCALIZATION Left 06/11/2018   Procedure: LEFT BREAST LUMPECTOMY WITH BRACKETED RADIOACTIVE SEED LOCALIZATION;  Surgeon: Fanny Skates, MD;  Location: [facility];  Service: General;  Laterality: Left;  . CARPAL TUNNEL RELEASE    . CATARACT EXTRACTION    . EYE SURGERY     cataract ext  . HERNIA REPAIR    . MASTECTOMY, PARTIAL Left   . SKIN CANCER EXCISION     face-Dr. Bing Plume  . TOTAL KNEE ARTHROPLASTY    .  TOTAL KNEE ARTHROPLASTY Right 2010   Dr Para March  . TOTAL KNEE ARTHROPLASTY  10/30/2011   Procedure: TOTAL KNEE ARTHROPLASTY;  Surgeon: Lorn Junes, MD;  Location: [facility];  Service: Orthopedics;  Laterality: Left;     . XRT     chemo, surgery    Social History:   reports that she quit smoking about 37 years ago. She has never used smokeless tobacco. She reports previous alcohol use. She reports that she does not use drugs.  Allergies  Allergen Reactions  . Cefuroxime Diarrhea  . Celecoxib Nausea Only  . Deltasone [Prednisone] Swelling    Oral deltasone is causing swelling (can use Depo-Medrol)  . Ranitidine Other (See Comments)    bloating  . Spironolactone Other  (See Comments)    High K  . Tapazole [Methimazole] Rash  . Tramadol Hcl Nausea Only  . Entresto [Sacubitril-Valsartan]     Side effects    Family History  Problem Relation Age of Onset  . Dementia Mother   . Heart disease Mother   . Mental retardation Mother   . Hypertension Mother   . Alzheimer's disease Mother   . Heart attack Father   . Alcohol abuse Father   . Prostate cancer Brother        dx >50  . Bladder Cancer Brother        dx >50  . Diabetes Other   . Brain cancer Maternal Aunt 70  . Brain cancer Maternal Uncle 40  . Lung cancer Paternal Uncle   . Heart attack Maternal Grandmother   . Heart attack Maternal Grandfather   . Lymphoma Other 38       NH  . Skin cancer Other   . Colon cancer Neg Hx   . Anesthesia problems Neg Hx      Prior to Admission medications   Medication Sig Start Date End Date Taking? Authorizing Provider  ALPRAZolam Duanne Moron) 0.5 MG tablet Take 1 tablet (0.5 mg total) by mouth 2 (two) times daily as needed for anxiety. 07/02/18  Yes Plotnikov, Evie Lacks, MD  apixaban (ELIQUIS) 2.5 MG TABS tablet Take 1 tablet (2.5 mg total) by mouth 2 (two) times daily. 03/12/18  Yes Bensimhon, Shaune Pascal, MD  carvedilol (COREG) 3.125 MG tablet Take 1 tablet (3.125 mg total) by mouth 2 (two) times daily with a meal. 02/11/18  Yes Josue Hector, MD  coal tar (NEUTROGENA T-GEL) 0.5 % shampoo Apply topically 2 (two) times a week.    Yes [provider]  ketoconazole (NIZORAL) 2 % shampoo Apply 1 application topically 2 (two) times a week.   Yes [provider]  Polyethyl Glycol-Propyl Glycol (SYSTANE ULTRA PF OP) Place 2 drops into both eyes as needed (for dry eyes).   Yes [provider]  potassium chloride SA (K-DUR,KLOR-CON) 20 MEQ tablet Take 1 tablet (20 mEq total) by mouth daily. 08/01/18  Yes Burtis Junes, NP  sulfamethoxazole-trimethoprim (BACTRIM DS) 800-160 MG tablet Take 1 tablet by mouth 2 (two) times daily.   Yes [provider]  torsemide (DEMADEX) 20 MG tablet TAKE TWO TABLETS BY MOUTH DAILY Patient taking differently: Take 40 mg by mouth daily.  04/30/18  Yes Plotnikov, Evie Lacks, MD  triamcinolone ointment (KENALOG) 0.1 % Apply 1 application topically 2 (two) times daily. Use qd -bid Patient taking differently: Apply 1 application topically 2 (two) times daily as needed (on affected area of skin).  05/06/18  Yes Plotnikov, Evie Lacks, MD  vitamin  C (ASCORBIC ACID) 500 MG tablet Take 1 tablet (500 mg total) by mouth daily. 03/14/16  Yes Plotnikov, Evie Lacks, MD    Physical Exam: Wt Readings from Last 3 Encounters:  09/16/18 53.7 kg  09/04/18 51.7 kg  08/26/18 51.9 kg   Vitals:   09/16/18 1630 09/16/18 1645 09/16/18 1700 09/16/18 1811  BP: (!) 134/93  109/76 108/75  Pulse: (!) 44 94 (!) 43 77  Resp: (!) 26 (!) 23 (!) 21 18  Temp:    97.7 F (36.5 C)  TempSrc:    Oral  SpO2: 100% 95% 97% 93%  Weight:    53.7 kg  Height:    5\' 3"  (1.6 m)      Constitutional:  Calm & comfortable Eyes: PERRLA, lids and conjunctivae normal ENT:  Mucous membranes are dry Pharynx clear of exudate   Normal dentition.  Neck: Supple, no masses  Respiratory:  Clear to auscultation bilaterally - very mild chest congestion  Normal respiratory effort.  Cardiovascular:  S1 & S2 heard, IIRR, rate controlled No Murmurs Abdomen:  Non distended No tenderness, No masses Bowel sounds normal Extremities:  No clubbing / cyanosis No pedal edema No joint deformity    Skin:  Shallow ulcer on left leg, no cellulitis noted, no pus on wound or dressing Neurologic:  AAO x 3 CN 2-12 grossly intact Sensation intact Strength 5/5 in all 4 extremities Psychiatric:  Normal Mood and affect    Labs on Admission: I have personally reviewed following labs and imaging studies  CBC: Recent Labs  Lab 09/16/18 1306  WBC 7.9  HGB 13.5  HCT 41.8  MCV 98.6  PLT 160   Basic Metabolic Panel: Recent Labs  Lab  09/16/18 1306 09/16/18 1315  NA 134*  --   K 4.9  --   CL 97*  --   CO2 24  --   GLUCOSE 125*  --   BUN 47*  --   CREATININE 2.16* 2.10*  CALCIUM 7.9*  --    GFR: Estimated Creatinine Clearance: 15.6 mL/min (A) (by C-G formula based on SCr of 2.1 mg/dL (H)). Liver Function Tests: Recent Labs  Lab 09/16/18 1306  AST 75*  ALT 58*  ALKPHOS 91  BILITOT 1.1  PROT 6.2*  ALBUMIN 3.8   No results for input(s): LIPASE, AMYLASE in the last 168 hours. No results for input(s): AMMONIA in the last 168 hours. Coagulation Profile: Recent Labs  Lab 09/16/18 1325  INR 1.84   Cardiac Enzymes: No results for input(s): CKTOTAL, CKMB, CKMBINDEX, TROPONINI in the last 168 hours. BNP (last 3 results) Recent Labs    06/17/18 1544 07/23/18 1305 07/31/18 1118  PROBNP 1,573.0* 8,739* 7,046*   HbA1C: No results for input(s): HGBA1C in the last 72 hours. CBG: No results for input(s): GLUCAP in the last 168 hours. Lipid Profile: No results for input(s): CHOL, HDL, LDLCALC, TRIG, CHOLHDL, LDLDIRECT in the last 72 hours. Thyroid Function Tests: No results for input(s): TSH, T4TOTAL, FREET4, T3FREE, THYROIDAB in the last 72 hours. Anemia Panel: No results for input(s): VITAMINB12, FOLATE, FERRITIN, TIBC, IRON, RETICCTPCT in the last 72 hours. Urine analysis:    Component Value Date/Time   COLORURINE YELLOW 09/16/2018 1210   APPEARANCEUR HAZY (A) 09/16/2018 1210   LABSPEC 1.010 09/16/2018 1210   PHURINE 5.0 09/16/2018 1210   GLUCOSEU NEGATIVE 09/16/2018 1210   GLUCOSEU NEGATIVE 05/06/2018 1440   HGBUR NEGATIVE 09/16/2018 1210   BILIRUBINUR NEGATIVE 09/16/2018 [place] 09/16/2018 1210  PROTEINUR NEGATIVE 09/16/2018 1210   UROBILINOGEN 0.2 05/06/2018 1440   NITRITE NEGATIVE 09/16/2018 1210   LEUKOCYTESUR MODERATE (A) 09/16/2018 1210   Sepsis Labs: @LABRCNTIP (procalcitonin:4,lacticidven:4) ) Recent Results (from the past 240 hour(s))  Aerobic Culture  (superficial specimen)     Status: None   Collection Time: 09/10/18  2:30 PM  Result Value Ref Range Status   Specimen Description   Final    LEG LEFT LOWER Performed at [facility] [address] [place], [place] [address]    Special Requests   Final    NONE Performed at [facility], [place] [address], [place], [place] [address]    Gram Stain   Final    FEW WBC PRESENT,BOTH PMN AND MONONUCLEAR RARE GRAM POSITIVE COCCI Performed at [facility], [place] [address], [place], [place] [address]    Culture FEW METHICILLIN RESISTANT STAPHYLOCOCCUS AUREUS  Final   Report Status 09/13/2018 FINAL  Final   Organism ID, Bacteria METHICILLIN RESISTANT STAPHYLOCOCCUS AUREUS  Final      Susceptibility   Methicillin resistant staphylococcus aureus - MIC*    CIPROFLOXACIN >=8 RESISTANT Resistant     ERYTHROMYCIN >=8 RESISTANT Resistant     GENTAMICIN <=0.5 SENSITIVE Sensitive     OXACILLIN >=4 RESISTANT Resistant     TETRACYCLINE 2 SENSITIVE Sensitive     VANCOMYCIN 1 SENSITIVE Sensitive     TRIMETH/SULFA <=10 SENSITIVE Sensitive     CLINDAMYCIN RESISTANT Resistant     RIFAMPIN <=0.5 SENSITIVE Sensitive     Inducible Clindamycin POSITIVE Resistant     * FEW METHICILLIN RESISTANT STAPHYLOCOCCUS AUREUS     Radiological Exams on Admission: Dg Chest 2 View  Result Date: 09/16/2018 CLINICAL DATA:  Cough and shortness of Breath EXAM: CHEST - 2 VIEW COMPARISON:  06/18/2018 FINDINGS: Cardiac shadow is enlarged. Aortic calcifications are again identified and stable. The lungs are well aerated bilaterally. Focal eventration of the left hemidiaphragm is noted and stable. No focal infiltrate or sizable effusion. No acute bony abnormality noted. IMPRESSION: No acute abnormality noted. Electronically Signed   By: Inez Catalina M.D.   On: 09/16/2018 12:47    EKG: Independently reviewed.A-fib at 120 bpm  Assessment/Plan Principal Problem:   AKI (acute kidney  injury) and hyponatremia due to Dehydration with resultant lactic acidosis CKD 3 H/o systolic CHF (EF 28%), mod Pulm HTN and Severe tricuspid regurgitation - dehydrated due to diarrhea- hold Demadex for today - the ED has ordered a 500 cc NS bolus, I will then continue her on NS 75 cc/hr for 10 hrs and reassess in AM  Active Problems: Diarrhea - in response to antibiotics- it seems both the Doxy and the Septra are culprits - abdomen is benign, no fever or leukocytosis- no need to check for C diff yet - will give Lomotil  TId (should get 2 doses today) - reassess tomorrow   Cellulitis-  - apparently was present in relation to ulcer on left leg- culture was done by Dr Dellia Nims -- grew MRSA- her physician planned for a 5 day course of Bactrim- today is day 3  Cough- COPD (chronic obstructive pulmonary disease) - no pneumonia- may have started out as a viral bronchitis and is now lingering- again, no fever or leukocytosis - plan> Mucinex DM (cannot take Codeine) and Pulmicort Nebs- if they help, she can transition to a steroid inhaler, Claritin - avoid Albuterol/ Xopenex in setting of A-fib- no significant wheezing or hypoxia - she is not hypoxic -  I question the h/o COPD as she is not on any maintenance medications    Essential hypertension - cont  Coreg for this and for A-fib     Atrial fibrillation - Coreg, Eliquis- rate controlled    DVT prophylaxis: Eliquis  Code Status: do not intubate  Family Communication: no family at bedside  Disposition Plan:  Hopefully home tomorrow Consults called: none  Admission status: observation    Debbe Odea MD [facility] Pager: [email or web address] Password [email or web address] 7PM-7AM, please contact night-coverage   09/16/2018, 6:52 PM

## 2018-09-16 NOTE — ED Notes (Signed)
Pt states she was placed on antibiotics for a staph infection to the lower left leg and that is when the diarrhea began.

## 2018-09-17 ENCOUNTER — Telehealth: Payer: Self-pay | Admitting: *Deleted

## 2018-09-17 DIAGNOSIS — J440 Chronic obstructive pulmonary disease with acute lower respiratory infection: Secondary | ICD-10-CM

## 2018-09-17 DIAGNOSIS — B9562 Methicillin resistant Staphylococcus aureus infection as the cause of diseases classified elsewhere: Secondary | ICD-10-CM | POA: Diagnosis not present

## 2018-09-17 DIAGNOSIS — I4891 Unspecified atrial fibrillation: Secondary | ICD-10-CM | POA: Diagnosis not present

## 2018-09-17 DIAGNOSIS — K521 Toxic gastroenteritis and colitis: Secondary | ICD-10-CM | POA: Diagnosis not present

## 2018-09-17 DIAGNOSIS — I5022 Chronic systolic (congestive) heart failure: Secondary | ICD-10-CM | POA: Diagnosis not present

## 2018-09-17 DIAGNOSIS — J449 Chronic obstructive pulmonary disease, unspecified: Secondary | ICD-10-CM | POA: Diagnosis not present

## 2018-09-17 DIAGNOSIS — L97812 Non-pressure chronic ulcer of other part of right lower leg with fat layer exposed: Secondary | ICD-10-CM | POA: Diagnosis not present

## 2018-09-17 DIAGNOSIS — E860 Dehydration: Secondary | ICD-10-CM | POA: Diagnosis not present

## 2018-09-17 DIAGNOSIS — L97822 Non-pressure chronic ulcer of other part of left lower leg with fat layer exposed: Secondary | ICD-10-CM | POA: Diagnosis not present

## 2018-09-17 DIAGNOSIS — N179 Acute kidney failure, unspecified: Secondary | ICD-10-CM | POA: Diagnosis not present

## 2018-09-17 DIAGNOSIS — S81801A Unspecified open wound, right lower leg, initial encounter: Secondary | ICD-10-CM | POA: Diagnosis not present

## 2018-09-17 DIAGNOSIS — L97212 Non-pressure chronic ulcer of right calf with fat layer exposed: Secondary | ICD-10-CM | POA: Diagnosis not present

## 2018-09-17 DIAGNOSIS — I87313 Chronic venous hypertension (idiopathic) with ulcer of bilateral lower extremity: Secondary | ICD-10-CM | POA: Diagnosis not present

## 2018-09-17 DIAGNOSIS — I87331 Chronic venous hypertension (idiopathic) with ulcer and inflammation of right lower extremity: Secondary | ICD-10-CM | POA: Diagnosis not present

## 2018-09-17 LAB — BASIC METABOLIC PANEL
Anion gap: 11 (ref 5–15)
BUN: 41 mg/dL — ABNORMAL HIGH (ref 8–23)
CHLORIDE: 100 mmol/L (ref 98–111)
CO2: 25 mmol/L (ref 22–32)
Calcium: 7.1 mg/dL — ABNORMAL LOW (ref 8.9–10.3)
Creatinine, Ser: 1.8 mg/dL — ABNORMAL HIGH (ref 0.44–1.00)
GFR calc Af Amer: 29 mL/min — ABNORMAL LOW (ref >60)
GFR calc non Af Amer: 25 mL/min — ABNORMAL LOW (ref >60)
Glucose, Bld: 87 mg/dL (ref 70–99)
Potassium: 4.2 mmol/L (ref 3.5–5.1)
Sodium: 136 mmol/L (ref 135–145)

## 2018-09-17 MED ORDER — DIPHENOXYLATE-ATROPINE 2.5-0.025 MG PO TABS: 1.0000 | ORAL_TABLET | Freq: Three times a day (TID) | ORAL | 0 refills | Status: DC

## 2018-09-17 MED ORDER — DM-GUAIFENESIN ER 30-600 MG PO TB12: 1.0000 | ORAL_TABLET | Freq: Two times a day (BID) | ORAL | 0 refills | Status: DC | PRN

## 2018-09-17 MED ORDER — LORATADINE 10 MG PO TABS: 10.0000 mg | ORAL_TABLET | Freq: Every day | ORAL | 0 refills | Status: DC | PRN

## 2018-09-17 MED FILL — DIPHENOXYLATE-ATROPINE TAB: 2.5-0.025 | 10 days supply | Qty: 30 | Fill #0

## 2018-09-17 MED FILL — LORATADINE 10 MG TABLET: 10 | 30 days supply | Qty: 30 | Fill #0

## 2018-09-17 MED FILL — MUCUS RELIEF DM 30-600 MG T: 30-600 | 10 days supply | Qty: 20 | Fill #0

## 2018-09-17 NOTE — Telephone Encounter (Signed)
Pt was on TCM report discharge this am, and hosp f/u appt was made for 09/23/18. Called pt to verify appt which she was currently at the wound center having her leg look at. She gave the phone to her neighbor which relayed to me what pt stated. Pt was not aware of the appt that was made, but she stated it will work. Driver states he can bring her. Completed TCM call below.../lmb  Transition Care Management Follow-up Telephone Call   Date discharged? 09/17/18   How have you been since you were released from the hospital? Pt states she is weak but that's expected due to being dehydrated and taking antibiotics    Do you understand why you were in the hospital? YES   Do you understand the discharge instructions? YES   Where were you discharged to? Home   Items Reviewed:  Medications reviewed: YES  Allergies reviewed: YES  Dietary changes reviewed: YES  Referrals reviewed: No referral needed   Functional Questionnaire:   Activities of Daily Living (ADLs):   She states she are independent in the following: bathing and hygiene, feeding, continence, grooming, toileting and dressing States they require assistance with the following: ambulation she sates she uses a walker to get around when she leave the house, other than that she takes her time and walk at home    Any transportation issues/concerns?: NO   Any patient concerns? NO   Confirmed importance and date/time of follow-up visits scheduled YES, 09/23/18  Provider Appointment booked with Dr. Alain Marion  Confirmed with patient if condition begins to worsen call PCP or go to the ER.  Patient was given the office number and encouraged to call back with question or concerns.  : YES

## 2018-09-17 NOTE — Plan of Care (Signed)
  Problem: Activity: °Goal: Risk for activity intolerance will decrease °Outcome: Progressing °  °Problem: Elimination: °Goal: Will not experience complications related to urinary retention °Outcome: Progressing °  °Problem: Safety: °Goal: Ability to remain free from injury will improve °Outcome: Progressing °  °Problem: Skin Integrity: °Goal: Risk for impaired skin integrity will decrease °Outcome: Progressing °  °

## 2018-09-17 NOTE — Progress Notes (Signed)
Mia Contreras to be D/C'd Home per MD order.  Discussed with the patient and all questions fully answered.  VSS, Skin clean, dry and intact without evidence of skin break down, no evidence of skin tears noted. IV catheter discontinued intact. Site without signs and symptoms of complications. Dressing and pressure applied.  An After Visit Summary was printed and given to the patient. Patient received prescription.  D/c education completed with patient/family including follow up instructions, medication list, d/c activities limitations if indicated, with other d/c instructions as indicated by MD - patient able to verbalize understanding, all questions fully answered.   Patient instructed to return to ED, call 911, or call MD for any changes in condition.   Patient escorted via [place], and D/C home via private auto.  Luci Bank 09/17/2018 11:24 AM

## 2018-09-17 NOTE — Discharge Summary (Signed)
Physician Discharge Summary  Mia Contreras ZHY:[id number] DOB: Sep 11, 1928 DOA: 09/16/2018  PCP: Cassandria Anger, MD  Admit date: 09/16/2018 Discharge date: 09/17/2018  Admitted From: Home Disposition:  home   Discharge Condition:  stable CODE STATUS:  Full code Consultations:  none   Discharge Diagnoses:  Principal Problem:   AKI (acute kidney injury) - dehydration Active Problems: Diarrhea due to antibiotics   Essential hypertension   Cough   COPD (chronic obstructive pulmonary disease) (HCC)   Chronic systolic heart failure (HCC)   Atrial fibrillation ([place])    Brief Summary: Mia Contreras is a 85 y.o. female with medical history of breast CA, HFrEF, A-fib on DOAC, COPD not on inhalers, left leg ulcer under the care of Dr Dellia Nims who comes in from her doctor's office to evaluate for pneumonia. She has had a cough since 1/16 which is currently productive of clear sputum. No fevers. She feels her cough has not gotten better but is also not worse. Not really short of breath on exertion. No wheezing.  She has also had an ulcer on her left leg that his being managed at the wound care center. The wound grew  Out MRSA and she was placed on Doxycyline on Tuesday and began to have diarrhea on Wednesday. The medication was changed to Septra but she continues to have about 3-4 loose BMs a day. No abdominal pain or nausea or vomiting.  No blood in the stool.   ED Course: A-fib with HR in 120s initially, improved without intervention to 80s Cr 2.16 up from her baseline of about 1.4.  CXR negative for infiltrates, WBC normal.  Admitted for AKI and diarrhea.  Hospital Course:  Principal Problem:   AKI (acute kidney injury) and hyponatremia due to Dehydration with resultant lactic acidosis CKD 3 H/o systolic CHF (EF 29%), mod Pulm HTN and Severe tricuspid regurgitation - dehydrated due to diarrhea- held Demadex and hydrated with NS overnight- Diarrhea has resolved with Lomotil  -Cr  has improved from 2.16 to 1.88 - baseline is ~ 1.3-1.5- recommended to hold Lasix for today and resume tomorrow    Active Problems: Diarrhea - in response to antibiotics- it seems both the Doxy and the Septra are culprits - abdomen is benign, no fever or leukocytosis- no need to check for C diff yet - diarrhea resolved after 2 doses of Lomotil  Cellulitis-  - apparently was present in relation to ulcer on left leg- culture was done by Dr Dellia Nims --her wound grew MRSA- her physician planned for a 5 day course of Bactrim- today is day 4- she has an appt today with Dr Dellia Nims- we are discharging her early to allow time for her to get to her appt  Cough- COPD (chronic obstructive pulmonary disease)? - may have started out as a viral bronchitis and is now lingering- no fever or leukocytosis, no infiltrates on CXR - plan> Mucinex DM (cannot take Codeine) and Pulmicort Nebs- if they help, she can transition to a steroid inhaler, Claritin - avoid Albuterol/ Xopenex in setting of A-fib- no significant wheezing or hypoxia - she is not hypoxic -  she is not on any maintenance medications for COPD but she states she has the diagnosis - as of today, her cough has improved significantly- I recommend to continue Mucinex DM and Claritin which I have prescribed    Essential hypertension - cont  Coreg for this and for A-fib     Atrial fibrillation - Coreg, Eliquis- rate controlled  Discharge Exam: Vitals:   09/17/18 0759 09/17/18 0957  BP:  109/83  Pulse:  (!) 101  Resp:    Temp:    SpO2: 90%    Vitals:   09/16/18 2156 09/17/18 0648 09/17/18 0759 09/17/18 0957  BP: 90/63 107/88  109/83  Pulse: 75 84  (!) 101  Resp: 18 18    Temp: (!) 97.5 F (36.4 C) 98 F (36.7 C)    TempSrc: Oral Oral    SpO2: 90% 91% 90%   Weight:      Height:        General: Pt is alert, awake, not in acute distress Cardiovascular: IIRR, S1/S2 +, no rubs, no gallops Respiratory: CTA bilaterally, no  wheezing, no rhonchi Abdominal: Soft, NT, ND, bowel sounds + Extremities: no edema, no cyanosis   Discharge Instructions  Discharge Instructions    Diet - low sodium heart healthy   Complete by:  As directed    Increase activity slowly   Complete by:  As directed      Allergies as of 09/17/2018      Reactions   Cefuroxime Diarrhea   Celecoxib Nausea Only   Deltasone [prednisone] Swelling   Oral deltasone is causing swelling (can use Depo-Medrol)   Ranitidine Other (See Comments)   bloating   Spironolactone Other (See Comments)   High K   Tapazole [methimazole] Rash   Tramadol Hcl Nausea Only   Entresto [sacubitril-valsartan]    Side effects      Medication List    STOP taking these medications   potassium chloride SA 20 MEQ tablet Commonly known as:  K-DUR,KLOR-CON   torsemide 20 MG tablet Commonly known as:  DEMADEX     TAKE these medications   ALPRAZolam 0.5 MG tablet Commonly known as:  XANAX Take 1 tablet (0.5 mg total) by mouth 2 (two) times daily as needed for anxiety.   apixaban 2.5 MG Tabs tablet Commonly known as:  ELIQUIS Take 1 tablet (2.5 mg total) by mouth 2 (two) times daily.   BACTRIM DS 800-160 MG tablet Generic drug:  sulfamethoxazole-trimethoprim Take 1 tablet by mouth 2 (two) times daily.   carvedilol 3.125 MG tablet Commonly known as:  COREG Take 1 tablet (3.125 mg total) by mouth 2 (two) times daily with a meal.   coal tar 0.5 % shampoo Commonly known as:  NEUTROGENA T-GEL Apply topically 2 (two) times a week.   dextromethorphan-guaiFENesin 30-600 MG 12hr tablet Commonly known as:  MUCINEX DM Take 1 tablet by mouth 2 (two) times daily as needed for cough.   diphenoxylate-atropine 2.5-0.025 MG tablet Commonly known as:  LOMOTIL Take 1 tablet by mouth 3 (three) times daily.   ketoconazole 2 % shampoo Commonly known as:  NIZORAL Apply 1 application topically 2 (two) times a week.   loratadine 10 MG tablet Commonly known as:   CLARITIN Take 1 tablet (10 mg total) by mouth daily as needed for allergies.   SYSTANE ULTRA PF OP Place 2 drops into both eyes as needed (for dry eyes).   triamcinolone ointment 0.1 % Commonly known as:  KENALOG Apply 1 application topically 2 (two) times daily. Use qd -bid What changed:    when to take this  reasons to take this  additional instructions   vitamin C 500 MG tablet Commonly known as:  ASCORBIC ACID Take 1 tablet (500 mg total) by mouth daily.      Follow-up Information    Plotnikov, Evie Lacks, MD Follow up.  Specialty:  Internal Medicine Contact information: [place] [address] [phone]          Allergies  Allergen Reactions  . Cefuroxime Diarrhea  . Celecoxib Nausea Only  . Deltasone [Prednisone] Swelling    Oral deltasone is causing swelling (can use Depo-Medrol)  . Ranitidine Other (See Comments)    bloating  . Spironolactone Other (See Comments)    High K  . Tapazole [Methimazole] Rash  . Tramadol Hcl Nausea Only  . Entresto [Sacubitril-Valsartan]     Side effects     Procedures/Studies:   Dg Chest 2 View  Result Date: 09/16/2018 CLINICAL DATA:  Cough and shortness of Breath EXAM: CHEST - 2 VIEW COMPARISON:  06/18/2018 FINDINGS: Cardiac shadow is enlarged. Aortic calcifications are again identified and stable. The lungs are well aerated bilaterally. Focal eventration of the left hemidiaphragm is noted and stable. No focal infiltrate or sizable effusion. No acute bony abnormality noted. IMPRESSION: No acute abnormality noted. Electronically Signed   By: Inez Catalina M.D.   On: 09/16/2018 12:47      The results of significant diagnostics from this hospitalization (including imaging, microbiology, ancillary and laboratory) are listed below for reference.     Microbiology: Recent Results (from the past 240 hour(s))  Aerobic Culture (superficial specimen)     Status: None   Collection Time: 09/10/18  2:30 PM   Result Value Ref Range Status   Specimen Description   Final    LEG LEFT LOWER Performed at [facility] [address], [place], [place] [address]    Special Requests   Final    NONE Performed at [facility], [place] [address], [place], [place] [address]    Gram Stain   Final    FEW WBC PRESENT,BOTH PMN AND MONONUCLEAR RARE GRAM POSITIVE COCCI Performed at [facility], [place] [address] [place], [place] [address]    Culture FEW METHICILLIN RESISTANT STAPHYLOCOCCUS AUREUS  Final   Report Status 09/13/2018 FINAL  Final   Organism ID, Bacteria METHICILLIN RESISTANT STAPHYLOCOCCUS AUREUS  Final      Susceptibility   Methicillin resistant staphylococcus aureus - MIC*    CIPROFLOXACIN >=8 RESISTANT Resistant     ERYTHROMYCIN >=8 RESISTANT Resistant     GENTAMICIN <=0.5 SENSITIVE Sensitive     OXACILLIN >=4 RESISTANT Resistant     TETRACYCLINE 2 SENSITIVE Sensitive     VANCOMYCIN 1 SENSITIVE Sensitive     TRIMETH/SULFA <=10 SENSITIVE Sensitive     CLINDAMYCIN RESISTANT Resistant     RIFAMPIN <=0.5 SENSITIVE Sensitive     Inducible Clindamycin POSITIVE Resistant     * FEW METHICILLIN RESISTANT STAPHYLOCOCCUS AUREUS  Culture, blood (Routine x 2)     Status: None (Preliminary result)   Collection Time: 09/16/18  6:49 PM  Result Value Ref Range Status   Specimen Description BLOOD LEFT ANTECUBITAL  Final   Special Requests   Final    BOTTLES DRAWN AEROBIC ONLY Blood Culture results may not be optimal due to an inadequate volume of blood received in culture bottles   Culture   Final    NO GROWTH < 24 HOURS Performed at [facility], [place] [address] [place], [place] [address]    Report Status PENDING  Incomplete  Culture, blood (Routine x 2)     Status: None (Preliminary result)   Collection Time: 09/16/18  6:50 PM  Result Value Ref Range Status   Specimen Description BLOOD BLOOD  RIGHT FOREARM  Final   Special Requests   Final     BOTTLES DRAWN AEROBIC ONLY Blood Culture adequate volume   Culture   Final    NO GROWTH < 24 HOURS Performed at [facility], [address], [place], [place] [address]    Report Status PENDING  Incomplete     Labs: BNP (last 3 results) Recent Labs    06/18/18 1905  BNP 6,045.4*   Basic Metabolic Panel: Recent Labs  Lab 09/16/18 1306 09/16/18 1315 09/17/18 0330  NA 134*  --  136  K 4.9  --  4.2  CL 97*  --  100  CO2 24  --  25  GLUCOSE 125*  --  87  BUN 47*  --  41*  CREATININE 2.16* 2.10* 1.80*  CALCIUM 7.9*  --  7.1*   Liver Function Tests: Recent Labs  Lab 09/16/18 1306  AST 75*  ALT 58*  ALKPHOS 91  BILITOT 1.1  PROT 6.2*  ALBUMIN 3.8   No results for input(s): LIPASE, AMYLASE in the last 168 hours. No results for input(s): AMMONIA in the last 168 hours. CBC: Recent Labs  Lab 09/16/18 1306  WBC 7.9  HGB 13.5  HCT 41.8  MCV 98.6  PLT 258   Cardiac Enzymes: No results for input(s): CKTOTAL, CKMB, CKMBINDEX, TROPONINI in the last 168 hours. BNP: Invalid input(s): POCBNP CBG: No results for input(s): GLUCAP in the last 168 hours. D-Dimer No results for input(s): DDIMER in the last 72 hours. Hgb A1c No results for input(s): HGBA1C in the last 72 hours. Lipid Profile No results for input(s): CHOL, HDL, LDLCALC, TRIG, CHOLHDL, LDLDIRECT in the last 72 hours. Thyroid function studies No results for input(s): TSH, T4TOTAL, T3FREE, THYROIDAB in the last 72 hours.  Invalid input(s): FREET3 Anemia work up No results for input(s): VITAMINB12, FOLATE, FERRITIN, TIBC, IRON, RETICCTPCT in the last 72 hours. Urinalysis    Component Value Date/Time   COLORURINE YELLOW 09/16/2018 1210   APPEARANCEUR HAZY (A) 09/16/2018 1210   LABSPEC 1.010 09/16/2018 1210   PHURINE 5.0 09/16/2018 1210   GLUCOSEU NEGATIVE 09/16/2018 1210   GLUCOSEU NEGATIVE 05/06/2018 1440   HGBUR NEGATIVE 09/16/2018 1210   BILIRUBINUR NEGATIVE 09/16/2018 1210   KETONESUR  NEGATIVE 09/16/2018 1210   PROTEINUR NEGATIVE 09/16/2018 1210   UROBILINOGEN 0.2 05/06/2018 1440   NITRITE NEGATIVE 09/16/2018 1210   LEUKOCYTESUR MODERATE (A) 09/16/2018 1210   Sepsis Labs Invalid input(s): PROCALCITONIN,  WBC,  LACTICIDVEN Microbiology Recent Results (from the past 240 hour(s))  Aerobic Culture (superficial specimen)     Status: None   Collection Time: 09/10/18  2:30 PM  Result Value Ref Range Status   Specimen Description   Final    LEG LEFT LOWER Performed at [facility], [place] [address] [place], [place] [address]    Special Requests   Final    NONE Performed at [facility], [place] [address] [place], [place] [address]    Gram Stain   Final    FEW WBC PRESENT,BOTH PMN AND MONONUCLEAR RARE GRAM POSITIVE COCCI Performed at [facility], [place] [address], [place], [place] [address]    Culture FEW METHICILLIN RESISTANT STAPHYLOCOCCUS AUREUS  Final   Report Status 09/13/2018 FINAL  Final   Organism ID, Bacteria METHICILLIN RESISTANT STAPHYLOCOCCUS AUREUS  Final      Susceptibility   Methicillin resistant staphylococcus aureus - MIC*    CIPROFLOXACIN >=8 RESISTANT Resistant  ERYTHROMYCIN >=8 RESISTANT Resistant     GENTAMICIN <=0.5 SENSITIVE Sensitive     OXACILLIN >=4 RESISTANT Resistant     TETRACYCLINE 2 SENSITIVE Sensitive     VANCOMYCIN 1 SENSITIVE Sensitive     TRIMETH/SULFA <=10 SENSITIVE Sensitive     CLINDAMYCIN RESISTANT Resistant     RIFAMPIN <=0.5 SENSITIVE Sensitive     Inducible Clindamycin POSITIVE Resistant     * FEW METHICILLIN RESISTANT STAPHYLOCOCCUS AUREUS  Culture, blood (Routine x 2)     Status: None (Preliminary result)   Collection Time: 09/16/18  6:49 PM  Result Value Ref Range Status   Specimen Description BLOOD LEFT ANTECUBITAL  Final   Special Requests   Final    BOTTLES DRAWN AEROBIC ONLY Blood Culture results may not be optimal due to an inadequate volume of blood received in  culture bottles   Culture   Final    NO GROWTH < 24 HOURS Performed at [facility] [address] [place], [place] [address]    Report Status PENDING  Incomplete  Culture, blood (Routine x 2)     Status: None (Preliminary result)   Collection Time: 09/16/18  6:50 PM  Result Value Ref Range Status   Specimen Description BLOOD BLOOD RIGHT FOREARM  Final   Special Requests   Final    BOTTLES DRAWN AEROBIC ONLY Blood Culture adequate volume   Culture   Final    NO GROWTH < 24 HOURS Performed at [facility], [place] [address], [place], [place] [address]    Report Status PENDING  Incomplete     Time coordinating discharge in minutes: 65  SIGNED:   Debbe Odea, MD  [facility] 09/17/2018, 6:08 PM Pager   If 7PM-7AM, please contact night-coverage [email or web address] Password [email or web address]

## 2018-09-17 NOTE — Discharge Instructions (Signed)
Do not take your Torsemid and potassium today. Resume tomorrow as long as you are not having significant diarrhea.    You were cared for by a hospitalist during your hospital stay. If you have any questions about your discharge medications or the care you received while you were in the hospital after you are discharged, you can call the unit and asked to speak with the hospitalist on call if the hospitalist that took care of you is not available. Once you are discharged, your primary care physician will handle any further medical issues.   Please note that NO REFILLS for any discharge medications will be authorized once you are discharged, as it is imperative that you return to your primary care physician (or establish a relationship with a primary care physician if you do not have one) for your aftercare needs so that they can reassess your need for medications and monitor your lab values.  Please take all your medications with you for your next visit with your Primary MD. Please ask your Primary MD to get all Hospital records sent to his/her office. Please request your Primary MD to go over all hospital test results at the follow up.   If you experience worsening of your admission symptoms, develop shortness of breath, chest pain, suicidal or homicidal thoughts or a life threatening emergency, you must seek medical attention immediately by calling 911 or calling your MD.   Dennis Bast must read the complete instructions/literature along with all the possible adverse reactions/side effects for all the medicines you take including new medications that have been prescribed to you. Take new medicines after you have completely understood and accpet all the possible adverse reactions/side effects.    Do not drive when taking pain medications or sedatives.     Do not take more than prescribed Pain, Sleep and Anxiety Medications   If you have smoked or chewed Tobacco in the last 2 yrs please stop. Stop any  regular alcohol  and or recreational drug use.   Wear Seat belts while driving.

## 2018-09-18 ENCOUNTER — Encounter: Payer: Self-pay | Admitting: Nurse Practitioner

## 2018-09-18 ENCOUNTER — Ambulatory Visit: Payer: Self-pay | Admitting: *Deleted

## 2018-09-18 ENCOUNTER — Ambulatory Visit (INDEPENDENT_AMBULATORY_CARE_PROVIDER_SITE_OTHER): Payer: Medicare Other | Admitting: Nurse Practitioner

## 2018-09-18 VITALS — BP 90/60 | HR 98 | Temp 97.9°F | Ht 63.0 in | Wt 120.0 lb

## 2018-09-18 DIAGNOSIS — I87331 Chronic venous hypertension (idiopathic) with ulcer and inflammation of right lower extremity: Secondary | ICD-10-CM | POA: Diagnosis not present

## 2018-09-18 DIAGNOSIS — B9562 Methicillin resistant Staphylococcus aureus infection as the cause of diseases classified elsewhere: Secondary | ICD-10-CM | POA: Diagnosis not present

## 2018-09-18 DIAGNOSIS — S81809A Unspecified open wound, unspecified lower leg, initial encounter: Secondary | ICD-10-CM | POA: Diagnosis not present

## 2018-09-18 DIAGNOSIS — J449 Chronic obstructive pulmonary disease, unspecified: Secondary | ICD-10-CM | POA: Diagnosis not present

## 2018-09-18 DIAGNOSIS — L97822 Non-pressure chronic ulcer of other part of left lower leg with fat layer exposed: Secondary | ICD-10-CM | POA: Diagnosis not present

## 2018-09-18 DIAGNOSIS — L97812 Non-pressure chronic ulcer of other part of right lower leg with fat layer exposed: Secondary | ICD-10-CM | POA: Diagnosis not present

## 2018-09-18 DIAGNOSIS — L97212 Non-pressure chronic ulcer of right calf with fat layer exposed: Secondary | ICD-10-CM | POA: Diagnosis not present

## 2018-09-18 NOTE — Telephone Encounter (Signed)
Pt called stating her left leg is draining fluid. She was discharged from the hospital yesterday and went to the wound clinic where they wrapped her leg. She said that she had some drainage from her leg while she was in the hospital and tid not think anything of it. She denies her leg being swollen. No pain. No increased shortness of breath. (has COPD and always has some SOB) Her leg is still wrapped. So she does not know if it is discolored. She can see her foot and states her foot looks normal. It drained so much over night that she had to change her clothes this morning. She is requesting to see Dr. Alain Marion this morning, but not available appointments with him. Scheduled with A. Shambley for this appointment. She has to call someone to bring her and will call back if she can not come at this time.  Routing to flow at [facility] at [facility]    Reason for Disposition . Nursing judgment  Protocols used: NO GUIDELINE OR REFERENCE AVAILABLE-A-AH

## 2018-09-18 NOTE — Progress Notes (Signed)
Mia Contreras is a 85 y.o. female with the following history as recorded in EpicCare:  Patient Active Problem List   Diagnosis Date Noted  . Dehydration 09/16/2018  . AKI (acute kidney injury) ([place]) 09/16/2018  . Genetic testing 07/22/2018  . Personal history of breast cancer 07/01/2018  . Family history of prostate cancer   . Family history of skin cancer   . Family history of lymphoma   . Family history of brain tumor   . Family history of lung cancer   . Severe tricuspid regurgitation 06/19/2018  . Malignant neoplasm of upper-inner quadrant of left breast in female, estrogen receptor positive ([place]) 05/10/2018  . Grief 02/12/2018  . Psoriasis 09/14/2017  . Liver disorder 09/13/2017  . Hematoma of leg 08/24/2017  . Myalgia 06/21/2017  . Rash and nonspecific skin eruption 05/22/2017  . Arthralgia 04/13/2017  . Vaginitis, atrophic 03/06/2017  . Symptomatic bradycardia   . Acute systolic CHF (congestive heart failure) ([place])   . Hyperkalemia 06/06/2016  . Skin tear of left forearm without complication 83/41/9622  . HCAP (healthcare-associated pneumonia) 01/30/2016  . Chronic anticoagulation-Eliquis 01/30/2016  . Hypotension-currently asymptomatic 01/30/2016  . Malnutrition of moderate degree 01/27/2016  . Falls 01/26/2016  . Hypoxia 01/26/2016  . Chronic CHF (congestive heart failure) ([place]) 01/26/2016  . Atrial fibrillation ([place]) 01/11/2016  . Acute kidney injury superimposed on chronic kidney disease ([place]) 12/14/2015  . CRF (chronic renal failure) 12/14/2015  . IBS (irritable bowel syndrome) 12/14/2015  . Open leg wound 11/08/2015  . Hiatal hernia 05/12/2015  . Subconjunctival hemorrhage 08/07/2014  . Nausea without vomiting 07/15/2014  . Noninfected skin tear of right leg 07/03/2014  . Hypoglycemia 12/12/2013  . Chronic systolic heart failure ([place]) 08/22/2013  . NICM (nonischemic cardiomyopathy) ([place]) 08/22/2013  . Diarrhea 10/01/2012  . Dyspnea on exertion 06/27/2012   . Asthmatic bronchitis 06/27/2012  . UTI (urinary tract infection) 11/02/2011  . Postoperative anemia due to acute blood loss 11/01/2011  . COPD (chronic obstructive pulmonary disease) ([place]) 08/16/2011  . Cough 08/08/2011  . Preop exam for internal medicine 08/02/2011  . Cerumen impaction 08/02/2011  . Bruit 04/19/2011  . Arrhythmia 03/29/2011  . Neoplasm of uncertain behavior of skin 09/21/2010  . PREMATURE ATRIAL CONTRACTIONS 09/21/2010  . CYSTITIS 09/21/2010  . Thyrotoxicosis 05/23/2010  . GOITER, MULTINODULAR 05/06/2010  . TOBACCO USE, QUIT 06/14/2009  . ELECTROCARDIOGRAM, ABNORMAL 04/02/2009  . SKIN CANCER, HX OF 11/12/2008  . CARPAL TUNNEL SYNDROME, RIGHT 01/11/2008  . SCOLIOSIS 01/11/2008  . VENOUS INSUFFICIENCY 09/05/2007  . Edema 09/05/2007  . Anemia in chronic kidney disease 08/20/2007  . DIVERTICULOSIS, COLON 08/20/2007  . CELLULITIS, LEG, RIGHT 08/20/2007  . Osteoporosis 08/20/2007  . Open wound of knee, leg, and ankle 07/03/2007  . Anxiety 03/16/2007  . Essential hypertension 03/16/2007  . Osteoarthritis 03/16/2007  . hx: breast cancer, left, infiltrating ductal 03/16/2007    Current Outpatient Medications  Medication Sig Dispense Refill  . ALPRAZolam (XANAX) 0.5 MG tablet Take 1 tablet (0.5 mg total) by mouth 2 (two) times daily as needed for anxiety. 60 tablet 3  . apixaban (ELIQUIS) 2.5 MG TABS tablet Take 1 tablet (2.5 mg total) by mouth 2 (two) times daily. 60 tablet 11  . carvedilol (COREG) 3.125 MG tablet Take 1 tablet (3.125 mg total) by mouth 2 (two) times daily with a meal. 60 tablet 11  . coal tar (NEUTROGENA T-GEL) 0.5 % shampoo Apply topically 2 (two) times a week.     Marland Kitchen dextromethorphan-guaiFENesin [facility]  DM) 30-600 MG 12hr tablet Take 1 tablet by mouth 2 (two) times daily as needed for cough. 60 tablet 0  . diphenoxylate-atropine (LOMOTIL) 2.5-0.025 MG tablet Take 1 tablet by mouth 3 (three) times daily. 30 tablet 0  . ketoconazole (NIZORAL) 2 %  shampoo Apply 1 application topically 2 (two) times a week.    . loratadine (CLARITIN) 10 MG tablet Take 1 tablet (10 mg total) by mouth daily as needed for allergies. 30 tablet 0  . Polyethyl Glycol-Propyl Glycol (SYSTANE ULTRA PF OP) Place 2 drops into both eyes as needed (for dry eyes).    Marland Kitchen sulfamethoxazole-trimethoprim (BACTRIM DS) 800-160 MG tablet Take 1 tablet by mouth 2 (two) times daily.    Marland Kitchen triamcinolone ointment (KENALOG) 0.1 % Apply 1 application topically 2 (two) times daily. Use qd -bid (Patient taking differently: Apply 1 application topically 2 (two) times daily as needed (on affected area of skin). ) 80 g 2  . vitamin C (ASCORBIC ACID) 500 MG tablet Take 1 tablet (500 mg total) by mouth daily. 100 tablet 3   No current facility-administered medications for this visit.     Allergies: Cefuroxime; Celecoxib; Deltasone [prednisone]; Ranitidine; Spironolactone; Tapazole [methimazole]; Tramadol hcl; and Entresto [sacubitril-valsartan]  Past Medical History:  Diagnosis Date  . AKI (acute kidney injury) ([place]) 01/2016  . ANEMIA-NOS 08/20/2007  . ANXIETY 03/16/2007  . BREAST CANCER, HX OF 03/16/2007  . CARPAL TUNNEL SYNDROME, RIGHT 01/11/2008  . Cataract    floaters  . CELLULITIS, LEG, RIGHT 08/20/2007  . CHF (congestive heart failure) ([place])   . Collagenous colitis   . Congestive heart failure with right ventricular systolic dysfunction ([place]) 06/19/2018  . COPD (chronic obstructive pulmonary disease) ([place])   . DIVERTICULOSIS, COLON 08/20/2007  . Dyspnea    on exertion  . Dysrhythmia    atrial fib  . Family history of brain tumor   . Family history of lung cancer   . Family history of lymphoma   . Family history of prostate cancer   . Family history of skin cancer   . GOITER, MULTINODULAR 05/06/2010   Dr [name]  . HYPERLIPIDEMIA 08/20/2007  . HYPERTENSION 03/16/2007  . HYPERTHYROIDISM 05/23/2010  . IBS (irritable bowel syndrome)   . OSTEOARTHRITIS 03/16/2007  . OSTEOPOROSIS  08/20/2007  . Personal history of breast cancer 07/01/2018  . Pneumonia 07/2011   took abx for several weeks  . PREMATURE ATRIAL CONTRACTIONS 09/21/2010  . SCOLIOSIS 01/11/2008  . Severe tricuspid regurgitation 06/19/2018  . SKIN CANCER, HX OF 11/12/2008    R cheek 2010, L Cheek 2011 Dr. Tonia Brooms  . Thyroid nodule   . Uterine cancer ([place])   . VENOUS INSUFFICIENCY 09/05/2007    Past Surgical History:  Procedure Laterality Date  . ABDOMINAL HYSTERECTOMY    . APPENDECTOMY  1972  . BREAST LUMPECTOMY Left 2001  . BREAST LUMPECTOMY WITH RADIOACTIVE SEED LOCALIZATION Left 06/11/2018   Procedure: LEFT BREAST LUMPECTOMY WITH BRACKETED RADIOACTIVE SEED LOCALIZATION;  Surgeon: Fanny Skates, MD;  Location: [facility];  Service: General;  Laterality: Left;  . CARPAL TUNNEL RELEASE    . CATARACT EXTRACTION    . EYE SURGERY     cataract ext  . HERNIA REPAIR    . MASTECTOMY, PARTIAL Left   . SKIN CANCER EXCISION     face-Dr. Bing Plume  . TOTAL KNEE ARTHROPLASTY    . TOTAL KNEE ARTHROPLASTY Right 2010   Dr Para March  . TOTAL KNEE ARTHROPLASTY  10/30/2011   Procedure: TOTAL KNEE  ARTHROPLASTY;  Surgeon: Lorn Junes, MD;  Location: [facility];  Service: Orthopedics;  Laterality: Left;     . XRT     chemo, surgery    Family History  Problem Relation Age of Onset  . Dementia Mother   . Heart disease Mother   . Mental retardation Mother   . Hypertension Mother   . Alzheimer's disease Mother   . Heart attack Father   . Alcohol abuse Father   . Prostate cancer Brother        dx >50  . Bladder Cancer Brother        dx >50  . Diabetes Other   . Brain cancer Maternal Aunt 70  . Brain cancer Maternal Uncle 40  . Lung cancer Paternal Uncle   . Heart attack Maternal Grandmother   . Heart attack Maternal Grandfather   . Lymphoma Other 38       [facility]  . Skin cancer Other   . Colon cancer Neg Hx   . Anesthesia problems Neg Hx     Social History   Tobacco Use  . Smoking status: Former Smoker    Last  attempt to quit: 12/20/1980    Years since quitting: 37.7  . Smokeless tobacco: Never Used  Substance Use Topics  . Alcohol use: Not Currently     Subjective:  Ms Scardina is here requesting evaluation of "wound drainage", with wounds to BLE, following with wound care and also has [facility] wound nurse coming to her house for dressing changes, last had dressings placed yesterday by wound care nurse, woke this morning and her bed sheet was wet with clear drainage under the left leg, worried the wounds are "weeping more", dressings are dry now. She is currently on bactrim for mrsa infection of wounds, seeing her PCP again next week for a hospital follow up and wound care appointment again next week as well. She denies fevers, chills, syncope, chest pain, edema, bleeding, leg pain   ROS- See HPI  Objective:  Vitals:   09/18/18 0952  BP: 90/60  Pulse: 98  Temp: 97.9 F (36.6 C)  TempSrc: Oral  SpO2: 90%  Weight: 120 lb (54.4 kg)  Height: 5\' 3"  (1.6 m)    General: Well developed, well nourished, in no acute distress  Skin : Warm and dry.  Head: Normocephalic and atraumatic  Eyes: Sclera and conjunctiva clear; pupils round and reactive to light; extraocular movements intact  Oropharynx: Pink, supple. No suspicious lesions  Neck: Supple Lungs: Respirations unlabored; clear to auscultation bilaterally  CVS exam: normal rate and regular rhythm.  usculoskeletal: No deformities; no active joint inflammation  Extremities: No edema, cyanosis, clubbing. Dry dressings to BLE. Vessels: Symmetric bilaterally  Neurologic: Alert and oriented; speech intact; face symmetrical; moves all extremities well; CNII-XII intact without focal deficit  Psychiatric: Normal mood and affect.   Assessment:  1. Open wound of lower extremity, unspecified laterality, initial encounter     Plan:   No changes in treatment today, dressings are clean and dry at this time, she has wound care nurse and provider to follow up  with Home management, red flags and return precautions including when to seek immediate care discussed and printed on AVS Continue planned follow up with wound care specialists, PCP  No follow-ups on file.  No orders of the defined types were placed in this encounter.   Requested Prescriptions    No prescriptions requested or ordered in this encounter

## 2018-09-18 NOTE — Patient Instructions (Signed)
Keep planned follow up with wound clinic and Dr Alain Marion   Wound Care, Adult Taking care of your wound properly can help to prevent pain, infection, and scarring. It can also help your wound to heal more quickly. How to care for your wound Wound care      Follow instructions from your health care provider about how to take care of your wound. Make sure you: ? Wash your hands with soap and water before you change the bandage (dressing). If soap and water are not available, use hand sanitizer. ? Change your dressing as told by your health care provider. ? Leave stitches (sutures), skin glue, or adhesive strips in place. These skin closures may need to stay in place for 2 weeks or longer. If adhesive strip edges start to loosen and curl up, you may trim the loose edges. Do not remove adhesive strips completely unless your health care provider tells you to do that.  Check your wound area every day for signs of infection. Check for: ? Redness, swelling, or pain. ? Fluid or blood. ? Warmth. ? Pus or a bad smell.  Ask your health care provider if you should clean the wound with mild soap and water. Doing this may include: ? Using a clean towel to pat the wound dry after cleaning it. Do not rub or scrub the wound. ? Applying a cream or ointment. Do this only as told by your health care provider. ? Covering the incision with a clean dressing.  Ask your health care provider when you can leave the wound uncovered.  Keep the dressing dry until your health care provider says it can be removed. Do not take baths, swim, use a hot tub, or do anything that would put the wound underwater until your health care provider approves. Ask your health care provider if you can take showers. You may only be allowed to take sponge baths. Medicines   If you were prescribed an antibiotic medicine, cream, or ointment, take or use the antibiotic as told by your health care provider. Do not stop taking or using the  antibiotic even if your condition improves.  Take over-the-counter and prescription medicines only as told by your health care provider. If you were prescribed pain medicine, take it 30 or more minutes before you do any wound care or as told by your health care provider. General instructions  Return to your normal activities as told by your health care provider. Ask your health care provider what activities are safe.  Do not scratch or pick at the wound.  Do not use any products that contain nicotine or tobacco, such as cigarettes and e-cigarettes. These may delay wound healing. If you need help quitting, ask your health care provider.  Keep all follow-up visits as told by your health care provider. This is important.  Eat a diet that includes protein, vitamin A, vitamin C, and other nutrient-rich foods to help the wound heal. ? Foods rich in protein include meat, dairy, beans, nuts, and other sources. ? Foods rich in vitamin A include carrots and dark green, leafy vegetables. ? Foods rich in vitamin C include citrus, tomatoes, and other fruits and vegetables. ? Nutrient-rich foods have protein, carbohydrates, fat, vitamins, or minerals. Eat a variety of healthy foods including vegetables, fruits, and whole grains. Contact a health care provider if:  You received a tetanus shot and you have swelling, severe pain, redness, or bleeding at the injection site.  Your pain is not controlled with  medicine.  You have redness, swelling, or pain around the wound.  You have fluid or blood coming from the wound.  Your wound feels warm to the touch.  You have pus or a bad smell coming from the wound.  You have a fever or chills.  You are nauseous or you vomit.  You are dizzy. Get help right away if:  You have a red streak going away from your wound.  The edges of the wound open up and separate.  Your wound is bleeding, and the bleeding does not stop with gentle pressure.  You have a  rash.  You faint.  You have trouble breathing. Summary  Always wash your hands with soap and water before changing your bandage (dressing).  To help with healing, eat foods that are rich in protein, vitamin A, vitamin C, and other nutrients.  Check your wound every day for signs of infection. Contact your health care provider if you suspect that your wound is infected. This information is not intended to replace advice given to you by your health care provider. Make sure you discuss any questions you have with your health care provider. Document Released: 05/16/2008 Document Revised: 09/18/2017 Document Reviewed: 02/22/2016 Elsevier Interactive Patient Education  2019 [employer].

## 2018-09-19 DIAGNOSIS — I4891 Unspecified atrial fibrillation: Secondary | ICD-10-CM | POA: Diagnosis not present

## 2018-09-19 DIAGNOSIS — I87311 Chronic venous hypertension (idiopathic) with ulcer of right lower extremity: Secondary | ICD-10-CM | POA: Diagnosis not present

## 2018-09-19 DIAGNOSIS — E785 Hyperlipidemia, unspecified: Secondary | ICD-10-CM | POA: Diagnosis not present

## 2018-09-19 DIAGNOSIS — I890 Lymphedema, not elsewhere classified: Secondary | ICD-10-CM | POA: Diagnosis not present

## 2018-09-19 DIAGNOSIS — I5082 Biventricular heart failure: Secondary | ICD-10-CM | POA: Diagnosis not present

## 2018-09-19 DIAGNOSIS — F419 Anxiety disorder, unspecified: Secondary | ICD-10-CM | POA: Diagnosis not present

## 2018-09-20 DIAGNOSIS — L97812 Non-pressure chronic ulcer of other part of right lower leg with fat layer exposed: Secondary | ICD-10-CM | POA: Diagnosis not present

## 2018-09-20 DIAGNOSIS — B9562 Methicillin resistant Staphylococcus aureus infection as the cause of diseases classified elsewhere: Secondary | ICD-10-CM | POA: Diagnosis not present

## 2018-09-20 DIAGNOSIS — I87331 Chronic venous hypertension (idiopathic) with ulcer and inflammation of right lower extremity: Secondary | ICD-10-CM | POA: Diagnosis not present

## 2018-09-20 DIAGNOSIS — L97212 Non-pressure chronic ulcer of right calf with fat layer exposed: Secondary | ICD-10-CM | POA: Diagnosis not present

## 2018-09-20 DIAGNOSIS — J449 Chronic obstructive pulmonary disease, unspecified: Secondary | ICD-10-CM | POA: Diagnosis not present

## 2018-09-20 DIAGNOSIS — L97822 Non-pressure chronic ulcer of other part of left lower leg with fat layer exposed: Secondary | ICD-10-CM | POA: Diagnosis not present

## 2018-09-20 DIAGNOSIS — I87312 Chronic venous hypertension (idiopathic) with ulcer of left lower extremity: Secondary | ICD-10-CM | POA: Diagnosis not present

## 2018-09-21 LAB — CULTURE, BLOOD (ROUTINE X 2)
CULTURE: NO GROWTH
Culture: NO GROWTH
Special Requests: ADEQUATE

## 2018-09-23 ENCOUNTER — Ambulatory Visit (INDEPENDENT_AMBULATORY_CARE_PROVIDER_SITE_OTHER): Payer: Medicare Other | Admitting: Internal Medicine

## 2018-09-23 ENCOUNTER — Encounter: Payer: Self-pay | Admitting: Internal Medicine

## 2018-09-23 VITALS — BP 110/74 | HR 79 | Temp 98.2°F | Ht 63.0 in | Wt 116.0 lb

## 2018-09-23 DIAGNOSIS — I4891 Unspecified atrial fibrillation: Secondary | ICD-10-CM | POA: Diagnosis not present

## 2018-09-23 DIAGNOSIS — S91009D Unspecified open wound, unspecified ankle, subsequent encounter: Secondary | ICD-10-CM

## 2018-09-23 DIAGNOSIS — S81009D Unspecified open wound, unspecified knee, subsequent encounter: Secondary | ICD-10-CM | POA: Diagnosis not present

## 2018-09-23 DIAGNOSIS — S81809D Unspecified open wound, unspecified lower leg, subsequent encounter: Secondary | ICD-10-CM

## 2018-09-23 DIAGNOSIS — I5022 Chronic systolic (congestive) heart failure: Secondary | ICD-10-CM | POA: Diagnosis not present

## 2018-09-23 DIAGNOSIS — R627 Adult failure to thrive: Secondary | ICD-10-CM | POA: Diagnosis not present

## 2018-09-23 NOTE — Progress Notes (Signed)
Subjective:  Patient ID: Mia Contreras, female    DOB: [birth year]  Age: 85 y.o. MRN: [id number]  CC: No chief complaint on file.   HPI Mia Contreras presents for post-hosp f/u for dehydration. C/o weakness, fatigue, edema, CHF f/u. Hospital documents reviewed. Treated for dehydration, bronchitis The pt had a mild nose bleed  Discharge Diagnoses:  Principal Problem:   AKI (acute kidney injury) - dehydration Active Problems: Diarrhea due to antibiotics   Essential hypertension   Cough   COPD (chronic obstructive pulmonary disease) (HCC)   Chronic systolic heart failure (H[facility])   Atrial fibrillation ([facility])    Brief Summary: Mia Contreras a 85 y.o.femalewith medical history ofbreast CA, HFrEF, A-fib on DOAC, COPD not on inhalers, left leg ulcer under the care of Dr Dellia Nims who comes in from her doctor's office to evaluate for pneumonia. She has had a cough since 1/16 which is currently productive of clear sputum. No fevers. She feels her cough has not gotten better but is also not worse. Not really short of breath on exertion. No wheezing.  She has also had an ulcer on her left leg that his being managed at the wound care center. The wound grew Out MRSA and she was placed on Doxycyline on Tuesday and began to have diarrhea on Wednesday. The medication was changed to Septra but she continues to have about 3-4 loose BMs a day. No abdominal pain or nausea or vomiting. No blood in the stool.   ED Course:A-fib with HR in 120s initially, improved without intervention to 80s Cr 2.16 up from her baseline of about 1.4.  CXR negative for infiltrates, WBC normal. Admitted for AKI and diarrhea.  Hospital Course:  Principal Problem: AKI (acute kidney injury) and hyponatremia due toDehydration with resultant lactic acidosis CKD 3 H/o systolic CHF (EF 56%), mod Pulm HTN andSevere tricuspid regurgitation - dehydrated due to diarrhea- held Demadex and hydrated with NS overnight-  Diarrhea has resolved with Lomotil  -Cr has improved from 2.16 to 1.88 - baseline is ~ 1.3-1.5- recommended to hold Lasix for today and resume tomorrow    Active Problems: Diarrhea - in response to antibiotics- it seems both the Doxy and the Septra are culprits - abdomen is benign, no fever or leukocytosis- no need to check for C diff yet - diarrhea resolved after 2 doses of Lomotil  Cellulitis-  - apparently was present in relation to ulcer on left leg- culture was done by Dr Dellia Nims --her wound grewMRSA- her physician planned for a 5 day course of Bactrim- today is day 4- she has an appt today with Dr Dellia Nims- we are discharging her early to allow time for her to get to her appt  Cough-COPD (chronic obstructive pulmonary disease)? - may have started out as a viral bronchitis and is now lingering- no fever or leukocytosis, no infiltrates on CXR - plan> Mucinex DM (cannot take Codeine) and Pulmicort Nebs- if they help, she can transition to a steroid inhaler, Claritin - avoid Albuterol/ Xopenex in setting of A-fib- no significant wheezing or hypoxia - she is not hypoxic -  she is not on any maintenance medications for COPD but she states she has the diagnosis - as of today, her cough has improved significantly- I recommend to continue Mucinex DM and Claritin which I have prescribed  Essential hypertension - cont Coreg for this and for A-fib  Atrial fibrillation - Coreg, Eliquis- rate controlled   Outpatient Medications Prior to Visit  Medication  Sig Dispense Refill  . ALPRAZolam (XANAX) 0.5 MG tablet Take 1 tablet (0.5 mg total) by mouth 2 (two) times daily as needed for anxiety. 60 tablet 3  . apixaban (ELIQUIS) 2.5 MG TABS tablet Take 1 tablet (2.5 mg total) by mouth 2 (two) times daily. 60 tablet 11  . carvedilol (COREG) 3.125 MG tablet Take 1 tablet (3.125 mg total) by mouth 2 (two) times daily with a meal. 60 tablet 11  . coal tar (NEUTROGENA T-GEL) 0.5 % shampoo  Apply topically 2 (two) times a week.     . diphenoxylate-atropine (LOMOTIL) 2.5-0.025 MG tablet Take 1 tablet by mouth 3 (three) times daily. 30 tablet 0  . ketoconazole (NIZORAL) 2 % shampoo Apply 1 application topically 2 (two) times a week.    . loratadine (CLARITIN) 10 MG tablet Take 1 tablet (10 mg total) by mouth daily as needed for allergies. 30 tablet 0  . Polyethyl Glycol-Propyl Glycol (SYSTANE ULTRA PF OP) Place 2 drops into both eyes as needed (for dry eyes).    . triamcinolone ointment (KENALOG) 0.1 % Apply 1 application topically 2 (two) times daily. Use qd -bid (Patient taking differently: Apply 1 application topically 2 (two) times daily as needed (on affected area of skin). ) 80 g 2  . vitamin C (ASCORBIC ACID) 500 MG tablet Take 1 tablet (500 mg total) by mouth daily. 100 tablet 3  . dextromethorphan-guaiFENesin (MUCINEX DM) 30-600 MG 12hr tablet Take 1 tablet by mouth 2 (two) times daily as needed for cough. (Patient not taking: Reported on 09/23/2018) 60 tablet 0  . sulfamethoxazole-trimethoprim (BACTRIM DS) 800-160 MG tablet Take 1 tablet by mouth 2 (two) times daily.     No facility-administered medications prior to visit.     ROS: Review of Systems  Constitutional: Positive for fatigue. Negative for activity change, appetite change, chills and unexpected weight change.  HENT: Negative for congestion, mouth sores and sinus pressure.   Eyes: Negative for visual disturbance.  Respiratory: Negative for cough and chest tightness.   Cardiovascular: Positive for leg swelling.  Gastrointestinal: Negative for abdominal pain and nausea.  Genitourinary: Negative for difficulty urinating, frequency and vaginal pain.  Musculoskeletal: Positive for gait problem. Negative for back pain.  Skin: Negative for pallor and rash.  Neurological: Negative for dizziness, tremors, weakness, numbness and headaches.  Psychiatric/Behavioral: Negative for confusion, sleep disturbance and suicidal  ideas. The patient is nervous/anxious.     Objective:  BP 110/74 (BP Location: Right Arm, Patient Position: Sitting, Cuff Size: Normal)   Pulse 79   Temp 98.2 F (36.8 C) (Oral)   Ht 5\' 3"  (1.6 m)   Wt 116 lb (52.6 kg)   SpO2 96%   BMI 20.55 kg/m   BP Readings from Last 3 Encounters:  09/23/18 110/74  09/18/18 90/60  09/17/18 109/83    Wt Readings from Last 3 Encounters:  09/23/18 116 lb (52.6 kg)  09/18/18 120 lb (54.4 kg)  09/16/18 118 lb 6.2 oz (53.7 kg)    Physical Exam Constitutional:      General: She is not in acute distress.    Appearance: She is well-developed.  HENT:     Head: Normocephalic.     Right Ear: External ear normal.     Left Ear: External ear normal.     Nose: Nose normal.  Eyes:     General:        Right eye: No discharge.        Left eye: No discharge.  Conjunctiva/sclera: Conjunctivae normal.     Pupils: Pupils are equal, round, and reactive to light.  Neck:     Musculoskeletal: Normal range of motion and neck supple.     Thyroid: No thyromegaly.     Vascular: No JVD.     Trachea: No tracheal deviation.  Cardiovascular:     Rate and Rhythm: Normal rate and regular rhythm.     Heart sounds: Normal heart sounds.  Pulmonary:     Effort: No respiratory distress.     Breath sounds: No stridor. No wheezing.  Abdominal:     General: Bowel sounds are normal. There is no distension.     Palpations: Abdomen is soft. There is no mass.     Tenderness: There is no abdominal tenderness. There is no guarding or rebound.  Musculoskeletal:        General: No tenderness.  Lymphadenopathy:     Cervical: No cervical adenopathy.  Skin:    Findings: No erythema or rash.  Neurological:     Cranial Nerves: No cranial nerve deficit.     Motor: No abnormal muscle tone.     Coordination: Coordination normal.     Deep Tendon Reflexes: Reflexes normal.  Psychiatric:        Behavior: Behavior normal.        Thought Content: Thought content normal.          Judgment: Judgment normal.    A/o/c In a w/c  Lab Results  Component Value Date   WBC 7.9 09/16/2018   HGB 13.5 09/16/2018   HCT 41.8 09/16/2018   PLT 258 09/16/2018   GLUCOSE 87 09/17/2018   CHOL 130 11/04/2015   TRIG 77.0 11/04/2015   HDL 54.50 11/04/2015   LDLCALC 60 11/04/2015   ALT 58 (H) 09/16/2018   AST 75 (H) 09/16/2018   NA 136 09/17/2018   K 4.2 09/17/2018   CL 100 09/17/2018   CREATININE 1.80 (H) 09/17/2018   BUN 41 (H) 09/17/2018   CO2 25 09/17/2018   TSH 1.82 05/06/2018   INR 1.84 09/16/2018   HGBA1C 5.8 05/06/2018   MICROALBUR 0.6 03/23/2011    Dg Chest 2 View  Result Date: 09/16/2018 CLINICAL DATA:  Cough and shortness of Breath EXAM: CHEST - 2 VIEW COMPARISON:  06/18/2018 FINDINGS: Cardiac shadow is enlarged. Aortic calcifications are again identified and stable. The lungs are well aerated bilaterally. Focal eventration of the left hemidiaphragm is noted and stable. No focal infiltrate or sizable effusion. No acute bony abnormality noted. IMPRESSION: No acute abnormality noted. Electronically Signed   By: Inez Catalina M.D.   On: 09/16/2018 12:47    Assessment & Plan:   There are no diagnoses linked to this encounter.   No orders of the defined types were placed in this encounter.    Follow-up: No follow-ups on file.  Walker Kehr, MD

## 2018-09-23 NOTE — Assessment & Plan Note (Signed)
Pt  Is refusing help at home

## 2018-09-23 NOTE — Assessment & Plan Note (Addendum)
FTT: the pt  Is refusing help at home. We discussed senior living, Palliative care, hospice consult - pt declined. Post hospital RN visits I asked the pt not to drive

## 2018-09-23 NOTE — Assessment & Plan Note (Addendum)
Discussed FTT: the pt  Is refusing help at home. We discussed senior living, Palliative care, hospice consult - pt declined.

## 2018-09-23 NOTE — Assessment & Plan Note (Signed)
F/u w/Dr Dellia [facility]  FTT: the pt  Is refusing help at home. We discussed senior living, Palliative care, hospice consult - pt declined.

## 2018-09-24 ENCOUNTER — Encounter (HOSPITAL_BASED_OUTPATIENT_CLINIC_OR_DEPARTMENT_OTHER): Payer: Medicare Other | Attending: Internal Medicine

## 2018-09-24 ENCOUNTER — Telehealth: Payer: Self-pay | Admitting: Cardiovascular Disease

## 2018-09-24 DIAGNOSIS — I87312 Chronic venous hypertension (idiopathic) with ulcer of left lower extremity: Secondary | ICD-10-CM | POA: Diagnosis not present

## 2018-09-24 DIAGNOSIS — I110 Hypertensive heart disease with heart failure: Secondary | ICD-10-CM | POA: Insufficient documentation

## 2018-09-24 DIAGNOSIS — L97822 Non-pressure chronic ulcer of other part of left lower leg with fat layer exposed: Secondary | ICD-10-CM | POA: Diagnosis not present

## 2018-09-24 DIAGNOSIS — Z853 Personal history of malignant neoplasm of breast: Secondary | ICD-10-CM | POA: Diagnosis not present

## 2018-09-24 DIAGNOSIS — Z9221 Personal history of antineoplastic chemotherapy: Secondary | ICD-10-CM | POA: Insufficient documentation

## 2018-09-24 DIAGNOSIS — S81812D Laceration without foreign body, left lower leg, subsequent encounter: Secondary | ICD-10-CM | POA: Diagnosis not present

## 2018-09-24 DIAGNOSIS — I87311 Chronic venous hypertension (idiopathic) with ulcer of right lower extremity: Secondary | ICD-10-CM | POA: Diagnosis not present

## 2018-09-24 DIAGNOSIS — I5023 Acute on chronic systolic (congestive) heart failure: Secondary | ICD-10-CM | POA: Diagnosis not present

## 2018-09-24 DIAGNOSIS — L97211 Non-pressure chronic ulcer of right calf limited to breakdown of skin: Secondary | ICD-10-CM | POA: Diagnosis not present

## 2018-09-24 DIAGNOSIS — I509 Heart failure, unspecified: Secondary | ICD-10-CM | POA: Insufficient documentation

## 2018-09-24 DIAGNOSIS — L97812 Non-pressure chronic ulcer of other part of right lower leg with fat layer exposed: Secondary | ICD-10-CM | POA: Diagnosis not present

## 2018-09-24 DIAGNOSIS — S81811A Laceration without foreign body, right lower leg, initial encounter: Secondary | ICD-10-CM | POA: Diagnosis not present

## 2018-09-24 DIAGNOSIS — Z4800 Encounter for change or removal of nonsurgical wound dressing: Secondary | ICD-10-CM | POA: Diagnosis not present

## 2018-09-24 DIAGNOSIS — I429 Cardiomyopathy, unspecified: Secondary | ICD-10-CM | POA: Diagnosis not present

## 2018-09-24 DIAGNOSIS — I890 Lymphedema, not elsewhere classified: Secondary | ICD-10-CM | POA: Diagnosis not present

## 2018-09-24 DIAGNOSIS — Z7901 Long term (current) use of anticoagulants: Secondary | ICD-10-CM | POA: Diagnosis not present

## 2018-09-24 DIAGNOSIS — I87331 Chronic venous hypertension (idiopathic) with ulcer and inflammation of right lower extremity: Secondary | ICD-10-CM | POA: Insufficient documentation

## 2018-09-24 DIAGNOSIS — I5082 Biventricular heart failure: Secondary | ICD-10-CM | POA: Diagnosis not present

## 2018-09-24 DIAGNOSIS — I272 Pulmonary hypertension, unspecified: Secondary | ICD-10-CM | POA: Diagnosis not present

## 2018-09-24 DIAGNOSIS — J449 Chronic obstructive pulmonary disease, unspecified: Secondary | ICD-10-CM | POA: Diagnosis not present

## 2018-09-24 DIAGNOSIS — Z923 Personal history of irradiation: Secondary | ICD-10-CM | POA: Insufficient documentation

## 2018-09-24 MED ORDER — POTASSIUM CHLORIDE CRYS ER 20 MEQ PO TBCR: 20.0000 meq | EXTENDED_RELEASE_TABLET | Freq: Every day | ORAL | 3 refills | Status: DC

## 2018-09-24 MED ORDER — TORSEMIDE 20 MG PO TABS: 40.0000 mg | ORAL_TABLET | Freq: Two times a day (BID) | ORAL | 3 refills | Status: DC

## 2018-09-24 NOTE — Telephone Encounter (Signed)
  Pt c/o swelling: STAT is pt has developed SOB within 24 hours  1) How much weight have you gained and in what time span? na  2) If swelling, where is the swelling located? Knees and thighs  3) Are you currently taking a fluid pill? Yes, twice daily  4) Are you currently SOB? no  5) Do you have a log of your daily weights (if so, list)? na  6) Have you gained 3 pounds in a day or 5 pounds in a week? na  7) Have you traveled recently? No  Patient is at the wound care appt and they unwrapped her legs and her knees and thighs are swollen. She would like to know if she should double up on her lasix

## 2018-09-24 NOTE — Telephone Encounter (Signed)
New Message   Patient calling back stating on the last page of AVS is states for her to start taking her Torsemide again.  She would like a nurse to call her back to confirm.

## 2018-09-24 NOTE — Telephone Encounter (Signed)
Called patient back. Patient stated that she still has swelling in her BLE, but the wound doctor stated her swelling has improved. Patient was recently in the hospital and discharge instruction stated to stop Torsemide and Potassium. Patient stated she was suppose to start back on Torsemide and Potassium in a few days after her discharge. Informed patient that I want to clarify with Dr. Johnsie Cancel to see if she suppose to be on these medications or not, so I can add them back on her list if so. Informed patient that I would call her back.

## 2018-09-24 NOTE — Telephone Encounter (Signed)
Saw on AVS that patient could start back on Torsemide and Potassium the day after discharge. Added medications back on to patient's list, so she can get refills.

## 2018-09-26 DIAGNOSIS — S81812D Laceration without foreign body, left lower leg, subsequent encounter: Secondary | ICD-10-CM | POA: Diagnosis not present

## 2018-09-26 DIAGNOSIS — I87311 Chronic venous hypertension (idiopathic) with ulcer of right lower extremity: Secondary | ICD-10-CM | POA: Diagnosis not present

## 2018-09-26 DIAGNOSIS — L97211 Non-pressure chronic ulcer of right calf limited to breakdown of skin: Secondary | ICD-10-CM | POA: Diagnosis not present

## 2018-09-26 DIAGNOSIS — I5082 Biventricular heart failure: Secondary | ICD-10-CM | POA: Diagnosis not present

## 2018-09-26 DIAGNOSIS — I890 Lymphedema, not elsewhere classified: Secondary | ICD-10-CM | POA: Diagnosis not present

## 2018-09-26 DIAGNOSIS — I5023 Acute on chronic systolic (congestive) heart failure: Secondary | ICD-10-CM | POA: Diagnosis not present

## 2018-09-27 ENCOUNTER — Encounter: Payer: Self-pay | Admitting: Family

## 2018-09-27 ENCOUNTER — Ambulatory Visit (INDEPENDENT_AMBULATORY_CARE_PROVIDER_SITE_OTHER): Payer: Medicare Other | Admitting: Family

## 2018-09-27 ENCOUNTER — Telehealth: Payer: Self-pay

## 2018-09-27 ENCOUNTER — Telehealth (HOSPITAL_COMMUNITY): Payer: Self-pay | Admitting: Licensed Clinical Social Worker

## 2018-09-27 VITALS — BP 106/68 | HR 90 | Temp 97.9°F | Ht 63.0 in | Wt 116.0 lb

## 2018-09-27 DIAGNOSIS — L97211 Non-pressure chronic ulcer of right calf limited to breakdown of skin: Secondary | ICD-10-CM | POA: Diagnosis not present

## 2018-09-27 DIAGNOSIS — S81812D Laceration without foreign body, left lower leg, subsequent encounter: Secondary | ICD-10-CM | POA: Diagnosis not present

## 2018-09-27 DIAGNOSIS — I890 Lymphedema, not elsewhere classified: Secondary | ICD-10-CM | POA: Diagnosis not present

## 2018-09-27 DIAGNOSIS — R05 Cough: Secondary | ICD-10-CM | POA: Diagnosis not present

## 2018-09-27 DIAGNOSIS — R059 Cough, unspecified: Secondary | ICD-10-CM

## 2018-09-27 DIAGNOSIS — I87311 Chronic venous hypertension (idiopathic) with ulcer of right lower extremity: Secondary | ICD-10-CM | POA: Diagnosis not present

## 2018-09-27 DIAGNOSIS — I5082 Biventricular heart failure: Secondary | ICD-10-CM | POA: Diagnosis not present

## 2018-09-27 DIAGNOSIS — I5023 Acute on chronic systolic (congestive) heart failure: Secondary | ICD-10-CM | POA: Diagnosis not present

## 2018-09-27 MED ORDER — BENZONATATE 100 MG PO CAPS: 100.0000 mg | ORAL_CAPSULE | Freq: Three times a day (TID) | ORAL | 0 refills | Status: DC | PRN

## 2018-09-27 MED ORDER — TORSEMIDE 20 MG PO TABS: 40.0000 mg | ORAL_TABLET | Freq: Every day | ORAL | 3 refills | Status: DC

## 2018-09-27 NOTE — Progress Notes (Signed)
Mia Contreras is a 85 y.o. female with the following history as recorded in EpicCare:  Patient Active Problem List   Diagnosis Date Noted  . Failure to thrive in adult 09/23/2018  . Dehydration 09/16/2018  . AKI (acute kidney injury) ([place]) 09/16/2018  . Genetic testing 07/22/2018  . Personal history of breast cancer 07/01/2018  . Family history of prostate cancer   . Family history of skin cancer   . Family history of lymphoma   . Family history of brain tumor   . Family history of lung cancer   . Severe tricuspid regurgitation 06/19/2018  . Malignant neoplasm of upper-inner quadrant of left breast in female, estrogen receptor positive ([place]) 05/10/2018  . Grief 02/12/2018  . Psoriasis 09/14/2017  . Liver disorder 09/13/2017  . Hematoma of leg 08/24/2017  . Myalgia 06/21/2017  . Rash and nonspecific skin eruption 05/22/2017  . Arthralgia 04/13/2017  . Vaginitis, atrophic 03/06/2017  . Symptomatic bradycardia   . Acute systolic CHF (congestive heart failure) ([place])   . Hyperkalemia 06/06/2016  . Skin tear of left forearm without complication 62/37/6283  . HCAP (healthcare-associated pneumonia) 01/30/2016  . Chronic anticoagulation-Eliquis 01/30/2016  . Hypotension-currently asymptomatic 01/30/2016  . Malnutrition of moderate degree 01/27/2016  . Falls 01/26/2016  . Hypoxia 01/26/2016  . Chronic CHF (congestive heart failure) ([place]) 01/26/2016  . Atrial fibrillation ([place]) 01/11/2016  . Acute kidney injury superimposed on chronic kidney disease ([place]) 12/14/2015  . CRF (chronic renal failure) 12/14/2015  . IBS (irritable bowel syndrome) 12/14/2015  . Wound, open, leg 11/08/2015  . Hiatal hernia 05/12/2015  . Subconjunctival hemorrhage 08/07/2014  . Nausea without vomiting 07/15/2014  . Noninfected skin tear of right leg 07/03/2014  . Hypoglycemia 12/12/2013  . Chronic systolic heart failure ([place]) 08/22/2013  . NICM (nonischemic cardiomyopathy) ([place]) 08/22/2013  .  Diarrhea 10/01/2012  . Dyspnea on exertion 06/27/2012  . Asthmatic bronchitis 06/27/2012  . UTI (urinary tract infection) 11/02/2011  . Postoperative anemia due to acute blood loss 11/01/2011  . COPD (chronic obstructive pulmonary disease) ([place]) 08/16/2011  . Cough 08/08/2011  . Preop exam for internal medicine 08/02/2011  . Cerumen impaction 08/02/2011  . Bruit 04/19/2011  . Arrhythmia 03/29/2011  . Neoplasm of uncertain behavior of skin 09/21/2010  . PREMATURE ATRIAL CONTRACTIONS 09/21/2010  . CYSTITIS 09/21/2010  . Thyrotoxicosis 05/23/2010  . GOITER, MULTINODULAR 05/06/2010  . TOBACCO USE, QUIT 06/14/2009  . ELECTROCARDIOGRAM, ABNORMAL 04/02/2009  . SKIN CANCER, HX OF 11/12/2008  . CARPAL TUNNEL SYNDROME, RIGHT 01/11/2008  . SCOLIOSIS 01/11/2008  . VENOUS INSUFFICIENCY 09/05/2007  . Edema 09/05/2007  . Anemia in chronic kidney disease 08/20/2007  . DIVERTICULOSIS, COLON 08/20/2007  . CELLULITIS, LEG, RIGHT 08/20/2007  . Osteoporosis 08/20/2007  . Open wound of knee, leg, and ankle 07/03/2007  . Anxiety 03/16/2007  . Essential hypertension 03/16/2007  . Osteoarthritis 03/16/2007  . hx: breast cancer, left, infiltrating ductal 03/16/2007    Current Outpatient Medications  Medication Sig Dispense Refill  . ALPRAZolam (XANAX) 0.5 MG tablet Take 1 tablet (0.5 mg total) by mouth 2 (two) times daily as needed for anxiety. 60 tablet 3  . apixaban (ELIQUIS) 2.5 MG TABS tablet Take 1 tablet (2.5 mg total) by mouth 2 (two) times daily. 60 tablet 11  . carvedilol (COREG) 3.125 MG tablet Take 1 tablet (3.125 mg total) by mouth 2 (two) times daily with a meal. 60 tablet 11  . coal tar (NEUTROGENA T-GEL) 0.5 % shampoo Apply topically 2 (two) times  a week.     Marland Kitchen dextromethorphan-guaiFENesin (MUCINEX DM) 30-600 MG 12hr tablet Take 1 tablet by mouth 2 (two) times daily as needed for cough. 60 tablet 0  . diphenoxylate-atropine (LOMOTIL) 2.5-0.025 MG tablet Take 1 tablet by mouth 3 (three)  times daily. 30 tablet 0  . ketoconazole (NIZORAL) 2 % shampoo Apply 1 application topically 2 (two) times a week.    . loratadine (CLARITIN) 10 MG tablet Take 1 tablet (10 mg total) by mouth daily as needed for allergies. 30 tablet 0  . Polyethyl Glycol-Propyl Glycol (SYSTANE ULTRA PF OP) Place 2 drops into both eyes as needed (for dry eyes).    . potassium chloride SA (K-DUR,KLOR-CON) 20 MEQ tablet Take 1 tablet (20 mEq total) by mouth daily. 90 tablet 3  . torsemide (DEMADEX) 20 MG tablet Take 2 tablets (40 mg total) by mouth 2 (two) times daily. 180 tablet 3  . triamcinolone ointment (KENALOG) 0.1 % Apply 1 application topically 2 (two) times daily. Use qd -bid (Patient taking differently: Apply 1 application topically 2 (two) times daily as needed (on affected area of skin). ) 80 g 2  . vitamin C (ASCORBIC ACID) 500 MG tablet Take 1 tablet (500 mg total) by mouth daily. 100 tablet 3  . benzonatate (TESSALON) 100 MG capsule Take 1 capsule (100 mg total) by mouth 3 (three) times daily as needed. 20 capsule 0   No current facility-administered medications for this visit.     Allergies: Cefuroxime; Celecoxib; Deltasone [prednisone]; Ranitidine; Spironolactone; Tapazole [methimazole]; Tramadol hcl; and Entresto [sacubitril-valsartan]  Past Medical History:  Diagnosis Date  . AKI (acute kidney injury) ([place]) 01/2016  . ANEMIA-NOS 08/20/2007  . ANXIETY 03/16/2007  . BREAST CANCER, HX OF 03/16/2007  . CARPAL TUNNEL SYNDROME, RIGHT 01/11/2008  . Cataract    floaters  . CELLULITIS, LEG, RIGHT 08/20/2007  . CHF (congestive heart failure) ([place])   . Collagenous colitis   . Congestive heart failure with right ventricular systolic dysfunction ([place]) 06/19/2018  . COPD (chronic obstructive pulmonary disease) ([place])   . DIVERTICULOSIS, COLON 08/20/2007  . Dyspnea    on exertion  . Dysrhythmia    atrial fib  . Family history of brain tumor   . Family history of lung cancer   . Family history of  lymphoma   . Family history of prostate cancer   . Family history of skin cancer   . GOITER, MULTINODULAR 05/06/2010   Dr [name]  . HYPERLIPIDEMIA 08/20/2007  . HYPERTENSION 03/16/2007  . HYPERTHYROIDISM 05/23/2010  . IBS (irritable bowel syndrome)   . OSTEOARTHRITIS 03/16/2007  . OSTEOPOROSIS 08/20/2007  . Personal history of breast cancer 07/01/2018  . Pneumonia 07/2011   took abx for several weeks  . PREMATURE ATRIAL CONTRACTIONS 09/21/2010  . SCOLIOSIS 01/11/2008  . Severe tricuspid regurgitation 06/19/2018  . SKIN CANCER, HX OF 11/12/2008    R cheek 2010, L Cheek 2011 Dr. Tonia Brooms  . Thyroid nodule   . Uterine cancer ([place])   . VENOUS INSUFFICIENCY 09/05/2007    Past Surgical History:  Procedure Laterality Date  . ABDOMINAL HYSTERECTOMY    . APPENDECTOMY  1972  . BREAST LUMPECTOMY Left 2001  . BREAST LUMPECTOMY WITH RADIOACTIVE SEED LOCALIZATION Left 06/11/2018   Procedure: LEFT BREAST LUMPECTOMY WITH BRACKETED RADIOACTIVE SEED LOCALIZATION;  Surgeon: Fanny Skates, MD;  Location: [facility];  Service: General;  Laterality: Left;  . CARPAL TUNNEL RELEASE    . CATARACT EXTRACTION    . EYE SURGERY  cataract ext  . HERNIA REPAIR    . MASTECTOMY, PARTIAL Left   . SKIN CANCER EXCISION     face-Dr. Bing Plume  . TOTAL KNEE ARTHROPLASTY    . TOTAL KNEE ARTHROPLASTY Right 2010   Dr Para March  . TOTAL KNEE ARTHROPLASTY  10/30/2011   Procedure: TOTAL KNEE ARTHROPLASTY;  Surgeon: Lorn Junes, MD;  Location: [facility];  Service: Orthopedics;  Laterality: Left;     . XRT     chemo, surgery    Family History  Problem Relation Age of Onset  . Dementia Mother   . Heart disease Mother   . Mental retardation Mother   . Hypertension Mother   . Alzheimer's disease Mother   . Heart attack Father   . Alcohol abuse Father   . Prostate cancer Brother        dx >50  . Bladder Cancer Brother        dx >50  . Diabetes Other   . Brain cancer Maternal Aunt 70  . Brain cancer Maternal Uncle 40  .  Lung cancer Paternal Uncle   . Heart attack Maternal Grandmother   . Heart attack Maternal Grandfather   . Lymphoma Other 38       [facility]  . Skin cancer Other   . Colon cancer Neg Hx   . Anesthesia problems Neg Hx     Social History   Tobacco Use  . Smoking status: Former Smoker    Last attempt to quit: 12/20/1980    Years since quitting: 37.7  . Smokeless tobacco: Never Used  Substance Use Topics  . Alcohol use: Not Currently    Subjective:  Patient presents again with concerns for 2 week history of cough; just saw her PCP on Monday with these concerns- was told to use Mucinex DM, Claritin; Does have history of COPD- not currently taking any medication; had home health nurse check her this am- lungs were clear; had normal CXR on 1/27; is taking Torsemide for her CHF- in reviewing notes, there is a discrepancy about how medication is prescribed;     Objective:  Vitals:   09/27/18 1529  BP: 106/68  Pulse: 90  Temp: 97.9 F (36.6 C)  TempSrc: Oral  SpO2: 96%  Weight: 116 lb (52.6 kg)  Height: 5\' 3"  (1.6 m)    General: Well developed, well nourished, in no acute distress  Skin : Warm and dry.  Head: Normocephalic and atraumatic  Eyes: Sclera and conjunctiva clear; pupils round and reactive to light; extraocular movements intact  Ears: External normal; canals clear; tympanic membranes normal  Oropharynx: Pink, supple. No suspicious lesions  Neck: Supple without thyromegaly, adenopathy  Lungs: Respirations unlabored; clear to auscultation bilaterally without wheeze, rales, rhonchi  CVS exam: normal rate and regular rhythm.  Neurologic: Alert and oriented; speech intact; face symmetrical; moves all extremities well; CNII-XII intact without focal deficit   Assessment:  1. Cough     Plan:  Rx for Tessalon Perles 100 mg tid prn; reassurance; I have put in a call to her cardiologist to clarify her Torsemide- ? If cough could be related to uncontrolled to CHF; will let her know  once I hear back from her cardiologist.  No follow-ups on file.  No orders of the defined types were placed in this encounter.   Requested Prescriptions   Signed Prescriptions Disp Refills  . benzonatate (TESSALON) 100 MG capsule 20 capsule 0    Sig: Take 1 capsule (100 mg total)  by mouth 3 (three) times daily as needed.

## 2018-09-27 NOTE — Telephone Encounter (Signed)
Pt called in requesting an explanation for the change of dose on her Torsemide. She is confused on why she needs to take 2 tablets twice a day. She would like to be given a call back on [phone]. Please address. Thank you.

## 2018-09-27 NOTE — Telephone Encounter (Signed)
Patient is suppose to only take Torsemide 40 mg daily. This was the last dose she was on. Made sure patient's medication list showed correct frequency. Patient verbalized understanding and states she has been only taking 40 mg daily.

## 2018-09-27 NOTE — Telephone Encounter (Signed)
CSW received notification that pt is not eligible for [place] that pt not currently eligible for assistance for Eliquis.  CSW called to inform pt and she states she had also gotten a notification from them stating she is not eligible until she has spent more money on her medications for the year.  CSW will continue to follow and assist as needed  Jorge Ny, LCSW Clinical Social Worker [facility] [phone]

## 2018-09-30 DIAGNOSIS — I87312 Chronic venous hypertension (idiopathic) with ulcer of left lower extremity: Secondary | ICD-10-CM | POA: Diagnosis not present

## 2018-09-30 DIAGNOSIS — L97822 Non-pressure chronic ulcer of other part of left lower leg with fat layer exposed: Secondary | ICD-10-CM | POA: Diagnosis not present

## 2018-09-30 DIAGNOSIS — J449 Chronic obstructive pulmonary disease, unspecified: Secondary | ICD-10-CM | POA: Diagnosis not present

## 2018-09-30 DIAGNOSIS — I110 Hypertensive heart disease with heart failure: Secondary | ICD-10-CM | POA: Diagnosis not present

## 2018-09-30 DIAGNOSIS — I509 Heart failure, unspecified: Secondary | ICD-10-CM | POA: Diagnosis not present

## 2018-09-30 DIAGNOSIS — L97812 Non-pressure chronic ulcer of other part of right lower leg with fat layer exposed: Secondary | ICD-10-CM | POA: Diagnosis not present

## 2018-09-30 DIAGNOSIS — I87331 Chronic venous hypertension (idiopathic) with ulcer and inflammation of right lower extremity: Secondary | ICD-10-CM | POA: Diagnosis not present

## 2018-10-03 ENCOUNTER — Telehealth: Payer: Self-pay | Admitting: Cardiovascular Disease

## 2018-10-03 DIAGNOSIS — S81812D Laceration without foreign body, left lower leg, subsequent encounter: Secondary | ICD-10-CM | POA: Diagnosis not present

## 2018-10-03 DIAGNOSIS — L97211 Non-pressure chronic ulcer of right calf limited to breakdown of skin: Secondary | ICD-10-CM | POA: Diagnosis not present

## 2018-10-03 DIAGNOSIS — I890 Lymphedema, not elsewhere classified: Secondary | ICD-10-CM | POA: Diagnosis not present

## 2018-10-03 DIAGNOSIS — I87311 Chronic venous hypertension (idiopathic) with ulcer of right lower extremity: Secondary | ICD-10-CM | POA: Diagnosis not present

## 2018-10-03 DIAGNOSIS — I5023 Acute on chronic systolic (congestive) heart failure: Secondary | ICD-10-CM | POA: Diagnosis not present

## 2018-10-03 DIAGNOSIS — I5082 Biventricular heart failure: Secondary | ICD-10-CM | POA: Diagnosis not present

## 2018-10-03 NOTE — Telephone Encounter (Signed)
Pt c/o swelling: STAT is pt has developed SOB within 24 hours  1) How much weight have you gained and in what time span? no  2) If swelling, where is the swelling located? legs  3) Are you currently taking a fluid pill? torsemide (DEMADEX) 20 MG tablet  4) Are you currently SOB? no  5) Do you have a log of your daily weights (if so, list)? no  6) Have you gained 3 pounds in a day or 5 pounds in a week? no  7) Have you traveled recently? No  Home nurse was there today changing the dressing on her legs because she has a wound on her left leg, and she noticed some swelling on her legs.  She advised her to call office to let us know about the swelling.  She is have more swelling on the left leg than the right. She is wondering if she will need to increase the torsemide.

## 2018-10-03 NOTE — Telephone Encounter (Signed)
Patient aware of Dr. Kyla Balzarine recommendations. Patient stated she will see her wound doctor soon and discuss it with him.

## 2018-10-03 NOTE — Telephone Encounter (Signed)
No change in diuretic primary and wound center should address this

## 2018-10-03 NOTE — Telephone Encounter (Signed)
Tried to call patient back, no answer.   Patient called back. Patient is concerned about swelling in her BLE after her dressing change today. Patient was wondering if she should increase her Torsemide. Patient denies any SOB or weight gain. Encouraged patient to keep a low salt diet and elevated her legs. Informed patient that message would be sent to Dr. Johnsie Cancel for advisement.

## 2018-10-08 ENCOUNTER — Encounter (HOSPITAL_COMMUNITY): Payer: Self-pay | Admitting: Emergency Medicine

## 2018-10-08 ENCOUNTER — Inpatient Hospital Stay (HOSPITAL_COMMUNITY)
Admission: EM | Admit: 2018-10-08 | Discharge: 2018-10-15 | DRG: 602 | Disposition: A | Discharge status: Home Health | Payer: Medicare Other | Source: Ambulatory Visit | Attending: Internal Medicine | Admitting: Internal Medicine

## 2018-10-08 ENCOUNTER — Emergency Department (HOSPITAL_COMMUNITY): Payer: Medicare Other

## 2018-10-08 DIAGNOSIS — Z833 Family history of diabetes mellitus: Secondary | ICD-10-CM

## 2018-10-08 DIAGNOSIS — Z87891 Personal history of nicotine dependence: Secondary | ICD-10-CM

## 2018-10-08 DIAGNOSIS — L03116 Cellulitis of left lower limb: Secondary | ICD-10-CM | POA: Diagnosis present

## 2018-10-08 DIAGNOSIS — Z9981 Dependence on supplemental oxygen: Secondary | ICD-10-CM

## 2018-10-08 DIAGNOSIS — K589 Irritable bowel syndrome without diarrhea: Secondary | ICD-10-CM | POA: Diagnosis present

## 2018-10-08 DIAGNOSIS — R0902 Hypoxemia: Secondary | ICD-10-CM | POA: Diagnosis not present

## 2018-10-08 DIAGNOSIS — B9562 Methicillin resistant Staphylococcus aureus infection as the cause of diseases classified elsewhere: Secondary | ICD-10-CM | POA: Diagnosis present

## 2018-10-08 DIAGNOSIS — K449 Diaphragmatic hernia without obstruction or gangrene: Secondary | ICD-10-CM | POA: Diagnosis present

## 2018-10-08 DIAGNOSIS — N183 Chronic kidney disease, stage 3 (moderate): Secondary | ICD-10-CM | POA: Diagnosis present

## 2018-10-08 DIAGNOSIS — Z807 Family history of other malignant neoplasms of lymphoid, hematopoietic and related tissues: Secondary | ICD-10-CM

## 2018-10-08 DIAGNOSIS — I4891 Unspecified atrial fibrillation: Secondary | ICD-10-CM | POA: Diagnosis not present

## 2018-10-08 DIAGNOSIS — I272 Pulmonary hypertension, unspecified: Secondary | ICD-10-CM | POA: Diagnosis not present

## 2018-10-08 DIAGNOSIS — Z90710 Acquired absence of both cervix and uterus: Secondary | ICD-10-CM

## 2018-10-08 DIAGNOSIS — I482 Chronic atrial fibrillation, unspecified: Secondary | ICD-10-CM | POA: Diagnosis not present

## 2018-10-08 DIAGNOSIS — Z9012 Acquired absence of left breast and nipple: Secondary | ICD-10-CM

## 2018-10-08 DIAGNOSIS — Z808 Family history of malignant neoplasm of other organs or systems: Secondary | ICD-10-CM

## 2018-10-08 DIAGNOSIS — J432 Centrilobular emphysema: Secondary | ICD-10-CM | POA: Diagnosis present

## 2018-10-08 DIAGNOSIS — I130 Hypertensive heart and chronic kidney disease with heart failure and stage 1 through stage 4 chronic kidney disease, or unspecified chronic kidney disease: Secondary | ICD-10-CM | POA: Diagnosis not present

## 2018-10-08 DIAGNOSIS — Z8542 Personal history of malignant neoplasm of other parts of uterus: Secondary | ICD-10-CM

## 2018-10-08 DIAGNOSIS — M810 Age-related osteoporosis without current pathological fracture: Secondary | ICD-10-CM | POA: Diagnosis present

## 2018-10-08 DIAGNOSIS — Z79899 Other long term (current) drug therapy: Secondary | ICD-10-CM

## 2018-10-08 DIAGNOSIS — D631 Anemia in chronic kidney disease: Secondary | ICD-10-CM | POA: Diagnosis present

## 2018-10-08 DIAGNOSIS — L03119 Cellulitis of unspecified part of limb: Secondary | ICD-10-CM

## 2018-10-08 DIAGNOSIS — I4892 Unspecified atrial flutter: Secondary | ICD-10-CM | POA: Diagnosis not present

## 2018-10-08 DIAGNOSIS — I5022 Chronic systolic (congestive) heart failure: Secondary | ICD-10-CM | POA: Diagnosis not present

## 2018-10-08 DIAGNOSIS — F419 Anxiety disorder, unspecified: Secondary | ICD-10-CM | POA: Diagnosis present

## 2018-10-08 DIAGNOSIS — L0390 Cellulitis, unspecified: Secondary | ICD-10-CM

## 2018-10-08 DIAGNOSIS — Z888 Allergy status to other drugs, medicaments and biological substances status: Secondary | ICD-10-CM

## 2018-10-08 DIAGNOSIS — L03115 Cellulitis of right lower limb: Principal | ICD-10-CM | POA: Diagnosis present

## 2018-10-08 DIAGNOSIS — S81802A Unspecified open wound, left lower leg, initial encounter: Secondary | ICD-10-CM | POA: Diagnosis not present

## 2018-10-08 DIAGNOSIS — Z8042 Family history of malignant neoplasm of prostate: Secondary | ICD-10-CM

## 2018-10-08 DIAGNOSIS — Z811 Family history of alcohol abuse and dependence: Secondary | ICD-10-CM

## 2018-10-08 DIAGNOSIS — Z85828 Personal history of other malignant neoplasm of skin: Secondary | ICD-10-CM

## 2018-10-08 DIAGNOSIS — Z7901 Long term (current) use of anticoagulants: Secondary | ICD-10-CM

## 2018-10-08 DIAGNOSIS — E785 Hyperlipidemia, unspecified: Secondary | ICD-10-CM | POA: Diagnosis present

## 2018-10-08 DIAGNOSIS — Z801 Family history of malignant neoplasm of trachea, bronchus and lung: Secondary | ICD-10-CM

## 2018-10-08 DIAGNOSIS — Z96653 Presence of artificial knee joint, bilateral: Secondary | ICD-10-CM | POA: Diagnosis present

## 2018-10-08 DIAGNOSIS — J9601 Acute respiratory failure with hypoxia: Secondary | ICD-10-CM | POA: Diagnosis not present

## 2018-10-08 DIAGNOSIS — R5381 Other malaise: Secondary | ICD-10-CM | POA: Diagnosis present

## 2018-10-08 DIAGNOSIS — Z853 Personal history of malignant neoplasm of breast: Secondary | ICD-10-CM

## 2018-10-08 DIAGNOSIS — Z885 Allergy status to narcotic agent status: Secondary | ICD-10-CM

## 2018-10-08 DIAGNOSIS — Z8249 Family history of ischemic heart disease and other diseases of the circulatory system: Secondary | ICD-10-CM

## 2018-10-08 DIAGNOSIS — I429 Cardiomyopathy, unspecified: Secondary | ICD-10-CM | POA: Diagnosis not present

## 2018-10-08 DIAGNOSIS — Z810 Family history of intellectual disabilities: Secondary | ICD-10-CM

## 2018-10-08 DIAGNOSIS — Z8052 Family history of malignant neoplasm of bladder: Secondary | ICD-10-CM

## 2018-10-08 LAB — CBC WITH DIFFERENTIAL/PLATELET
Abs Immature Granulocytes: 0.02 10*3/uL (ref 0.00–0.07)
Basophils Absolute: 0.1 10*3/uL (ref 0.0–0.1)
Basophils Relative: 1 %
Eosinophils Absolute: 0.1 10*3/uL (ref 0.0–0.5)
Eosinophils Relative: 2 %
HCT: 42.2 % (ref 36.0–46.0)
Hemoglobin: 12.7 g/dL (ref 12.0–15.0)
Immature Granulocytes: 0 %
Lymphocytes Relative: 21 %
Lymphs Abs: 1.3 10*3/uL (ref 0.7–4.0)
MCH: 30.8 pg (ref 26.0–34.0)
MCHC: 30.1 g/dL (ref 30.0–36.0)
MCV: 102.4 fL — ABNORMAL HIGH (ref 80.0–100.0)
Monocytes Absolute: 0.9 10*3/uL (ref 0.1–1.0)
Monocytes Relative: 15 %
Neutro Abs: 3.7 10*3/uL (ref 1.7–7.7)
Neutrophils Relative %: 61 %
Platelets: 198 10*3/uL (ref 150–400)
RBC: 4.12 MIL/uL (ref 3.87–5.11)
RDW: 17.3 % — ABNORMAL HIGH (ref 11.5–15.5)
WBC: 6.1 10*3/uL (ref 4.0–10.5)
nRBC: 0 % (ref 0.0–0.2)

## 2018-10-08 LAB — BASIC METABOLIC PANEL
Anion gap: 10 (ref 5–15)
BUN: 32 mg/dL — ABNORMAL HIGH (ref 8–23)
CO2: 28 mmol/L (ref 22–32)
Calcium: 8.3 mg/dL — ABNORMAL LOW (ref 8.9–10.3)
Chloride: 101 mmol/L (ref 98–111)
Creatinine, Ser: 1.29 mg/dL — ABNORMAL HIGH (ref 0.44–1.00)
GFR calc Af Amer: 43 mL/min — ABNORMAL LOW (ref >60)
GFR calc non Af Amer: 37 mL/min — ABNORMAL LOW (ref >60)
Glucose, Bld: 93 mg/dL (ref 70–99)
Potassium: 4.5 mmol/L (ref 3.5–5.1)
Sodium: 139 mmol/L (ref 135–145)

## 2018-10-08 LAB — LACTIC ACID, PLASMA: Lactic Acid, Venous: 1 mmol/L (ref 0.5–1.9)

## 2018-10-08 MED ORDER — METOPROLOL TARTRATE 5 MG/5ML IV SOLN
5.0000 mg | Freq: Once | INTRAVENOUS | Status: AC
  Administered 2018-10-09: 5 mg via INTRAVENOUS
  Filled 2018-10-08: qty 5

## 2018-10-08 MED ORDER — VANCOMYCIN HCL 10 G IV SOLR
1250.0000 mg | Freq: Once | INTRAVENOUS | Status: AC
  Administered 2018-10-09: 1250 mg via INTRAVENOUS
  Filled 2018-10-08: qty 1250

## 2018-10-08 NOTE — ED Provider Notes (Signed)
  Face-to-face evaluation   History: Patient sent here for evaluation after seen by her managing doctor today for concern of worsening swelling in the leg, possibly indicating infection, bleeding, CHF,  Physical exam: Elderly female.  She is calm and comfortable.  Lungs with fair air movement bilaterally.  Heart tachycardic, irregularly irregular rhythm.  Left lower leg tender, swollen, red, with evidence for cellulitis, and central wound likely source for cellulitis.  Neurovascular intact distally.  Mild edema of the left upper leg.  Medical screening examination/treatment/procedure(s) were conducted as a shared visit with non-physician practitioner(s) and myself.  I personally evaluated the patient during the encounter   EKG Interpretation  Date/Time:  Tuesday October 08 2018 21:51:38 EST Ventricular Rate:  84 PR Interval:    QRS Duration: 108 QT Interval:  391 QTC Calculation: 463 R Axis:   -100 Text Interpretation:  Atrial fibrillation Incomplete RBBB and LAFB Probable right ventricular hypertrophy Probable anterolateral infarct, age indeterm Baseline wander in lead(s) V2 since last tracing no significant change Confirmed by Daleen Bo [phone]) on 10/08/2018 11:36:57 PM        Daleen Bo, MD 10/09/18 0031

## 2018-10-08 NOTE — ED Triage Notes (Signed)
Pt brought over from [facility] for left lower leg infection that has gotten worse with swelling. Pt can hardly bear weight on left leg.

## 2018-10-08 NOTE — Progress Notes (Signed)
A consult was received from an ED physician for vancomycin per pharmacy dosing.  The patient's profile has been reviewed for ht/wt/allergies/indication/available labs.   A one time order has been placed for Vancomycin 1250 mg.  Further antibiotics/pharmacy consults should be ordered by admitting physician if indicated.                       Thank you, Dorrene German 10/08/2018  11:37 PM

## 2018-10-08 NOTE — ED Provider Notes (Signed)
[facility] DEPT Provider Note   CSN: [id number] Arrival date & time: 10/08/18  1547    History   Chief Complaint Chief Complaint  Patient presents with  . leg infection  . sent from [place] is a 85 y.o. female with history of anemia, CHF, COPD, A. fib, HTN, HLD, IBS, venous insufficiency, recurrent cellulitis presenting for evaluation of acute onset, progressively worsening left lower extremity erythema, pain and swelling.  Reports she has been seeing wound care for the last 3 months or so due to a wound to the left lower extremity.  Has been on multiple different antibiotics, not currently on any.  Reports that for the last week or so she has had worsening spread of erythema and swelling of the lower extremity and worsening drainage from her wound.  Went to see her wound care physician Dr. Dellia Nims today who sent her to the ED for further evaluation.  She notes pain with any palpation and has significant difficulty ambulating due to the pain at this time.  He had some concern for possible DVT, hemorrhage into the leg, systemic fluid overload from her CHF/CKD, and possible contribution from A. fib with RVR.  Recent admission for AKI secondary to diarrhea as a result of antibiotic use.  Reports she has been compliant with her Eliquis.  Denies fevers, chest pain, or shortness of breath out of the ordinary for her.    The history is provided by the patient.    Past Medical History:  Diagnosis Date  . AKI (acute kidney injury) ([place]) 01/2016  . ANEMIA-NOS 08/20/2007  . ANXIETY 03/16/2007  . BREAST CANCER, HX OF 03/16/2007  . CARPAL TUNNEL SYNDROME, RIGHT 01/11/2008  . Cataract    floaters  . CELLULITIS, LEG, RIGHT 08/20/2007  . CHF (congestive heart failure) ([place])   . Collagenous colitis   . Congestive heart failure with right ventricular systolic dysfunction ([place]) 06/19/2018  . COPD (chronic obstructive pulmonary disease) ([place])     . DIVERTICULOSIS, COLON 08/20/2007  . Dyspnea    on exertion  . Dysrhythmia    atrial fib  . Family history of brain tumor   . Family history of lung cancer   . Family history of lymphoma   . Family history of prostate cancer   . Family history of skin cancer   . GOITER, MULTINODULAR 05/06/2010   Dr [name]  . HYPERLIPIDEMIA 08/20/2007  . HYPERTENSION 03/16/2007  . HYPERTHYROIDISM 05/23/2010  . IBS (irritable bowel syndrome)   . OSTEOARTHRITIS 03/16/2007  . OSTEOPOROSIS 08/20/2007  . Personal history of breast cancer 07/01/2018  . Pneumonia 07/2011   took abx for several weeks  . PREMATURE ATRIAL CONTRACTIONS 09/21/2010  . SCOLIOSIS 01/11/2008  . Severe tricuspid regurgitation 06/19/2018  . SKIN CANCER, HX OF 11/12/2008    R cheek 2010, L Cheek 2011 Dr. Tonia Brooms  . Thyroid nodule   . Uterine cancer ([place])   . VENOUS INSUFFICIENCY 09/05/2007    Patient Active Problem List   Diagnosis Date Noted  . Failure to thrive in adult 09/23/2018  . Dehydration 09/16/2018  . AKI (acute kidney injury) ([place]) 09/16/2018  . Genetic testing 07/22/2018  . Personal history of breast cancer 07/01/2018  . Family history of prostate cancer   . Family history of skin cancer   . Family history of lymphoma   . Family history of brain tumor   . Family history of lung cancer   . Severe  tricuspid regurgitation 06/19/2018  . Malignant neoplasm of upper-inner quadrant of left breast in female, estrogen receptor positive ([place]) 05/10/2018  . Grief 02/12/2018  . Psoriasis 09/14/2017  . Liver disorder 09/13/2017  . Hematoma of leg 08/24/2017  . Myalgia 06/21/2017  . Rash and nonspecific skin eruption 05/22/2017  . Arthralgia 04/13/2017  . Vaginitis, atrophic 03/06/2017  . Symptomatic bradycardia   . Acute systolic CHF (congestive heart failure) ([place])   . Hyperkalemia 06/06/2016  . Skin tear of left forearm without complication 53/66/4403  . HCAP (healthcare-associated pneumonia) 01/30/2016  . Chronic  anticoagulation-Eliquis 01/30/2016  . Hypotension-currently asymptomatic 01/30/2016  . Malnutrition of moderate degree 01/27/2016  . Falls 01/26/2016  . Hypoxia 01/26/2016  . Chronic CHF (congestive heart failure) ([place]) 01/26/2016  . Atrial fibrillation ([place]) 01/11/2016  . Acute kidney injury superimposed on chronic kidney disease ([place]) 12/14/2015  . CRF (chronic renal failure) 12/14/2015  . IBS (irritable bowel syndrome) 12/14/2015  . Wound, open, leg 11/08/2015  . Hiatal hernia 05/12/2015  . Subconjunctival hemorrhage 08/07/2014  . Nausea without vomiting 07/15/2014  . Noninfected skin tear of right leg 07/03/2014  . Hypoglycemia 12/12/2013  . Chronic systolic heart failure ([place]) 08/22/2013  . NICM (nonischemic cardiomyopathy) ([place]) 08/22/2013  . Diarrhea 10/01/2012  . Dyspnea on exertion 06/27/2012  . Asthmatic bronchitis 06/27/2012  . UTI (urinary tract infection) 11/02/2011  . Postoperative anemia due to acute blood loss 11/01/2011  . COPD (chronic obstructive pulmonary disease) ([place]) 08/16/2011  . Cough 08/08/2011  . Preop exam for internal medicine 08/02/2011  . Cerumen impaction 08/02/2011  . Bruit 04/19/2011  . Arrhythmia 03/29/2011  . Neoplasm of uncertain behavior of skin 09/21/2010  . PREMATURE ATRIAL CONTRACTIONS 09/21/2010  . CYSTITIS 09/21/2010  . Thyrotoxicosis 05/23/2010  . GOITER, MULTINODULAR 05/06/2010  . TOBACCO USE, QUIT 06/14/2009  . ELECTROCARDIOGRAM, ABNORMAL 04/02/2009  . SKIN CANCER, HX OF 11/12/2008  . CARPAL TUNNEL SYNDROME, RIGHT 01/11/2008  . SCOLIOSIS 01/11/2008  . VENOUS INSUFFICIENCY 09/05/2007  . Edema 09/05/2007  . Anemia in chronic kidney disease 08/20/2007  . DIVERTICULOSIS, COLON 08/20/2007  . CELLULITIS, LEG, RIGHT 08/20/2007  . Osteoporosis 08/20/2007  . Open wound of knee, leg, and ankle 07/03/2007  . Anxiety 03/16/2007  . Essential hypertension 03/16/2007  . Osteoarthritis 03/16/2007  . hx: breast cancer, left, infiltrating  ductal 03/16/2007    Past Surgical History:  Procedure Laterality Date  . ABDOMINAL HYSTERECTOMY    . APPENDECTOMY  1972  . BREAST LUMPECTOMY Left 2001  . BREAST LUMPECTOMY WITH RADIOACTIVE SEED LOCALIZATION Left 06/11/2018   Procedure: LEFT BREAST LUMPECTOMY WITH BRACKETED RADIOACTIVE SEED LOCALIZATION;  Surgeon: Fanny Skates, MD;  Location: [facility];  Service: General;  Laterality: Left;  . CARPAL TUNNEL RELEASE    . CATARACT EXTRACTION    . EYE SURGERY     cataract ext  . HERNIA REPAIR    . MASTECTOMY, PARTIAL Left   . SKIN CANCER EXCISION     face-Dr. Bing Plume  . TOTAL KNEE ARTHROPLASTY    . TOTAL KNEE ARTHROPLASTY Right 2010   Dr Para March  . TOTAL KNEE ARTHROPLASTY  10/30/2011   Procedure: TOTAL KNEE ARTHROPLASTY;  Surgeon: Lorn Junes, MD;  Location: [facility];  Service: Orthopedics;  Laterality: Left;     . XRT     chemo, surgery     OB History   No obstetric history on file.      Home Medications    Prior to Admission medications   Medication Sig Start  Date End Date Taking? Authorizing Provider  ALPRAZolam Duanne Moron) 0.5 MG tablet Take 1 tablet (0.5 mg total) by mouth 2 (two) times daily as needed for anxiety. Patient taking differently: Take 0.5-1 mg by mouth at bedtime as needed for anxiety or sleep.  07/02/18  Yes Plotnikov, Evie Lacks, MD  apixaban (ELIQUIS) 2.5 MG TABS tablet Take 1 tablet (2.5 mg total) by mouth 2 (two) times daily. 03/12/18  Yes Bensimhon, Shaune Pascal, MD  carvedilol (COREG) 3.125 MG tablet Take 1 tablet (3.125 mg total) by mouth 2 (two) times daily with a meal. 02/11/18  Yes Josue Hector, MD  coal tar (NEUTROGENA T-GEL) 0.5 % shampoo Apply 1 application topically once a week.    Yes [provider]  diphenoxylate-atropine (LOMOTIL) 2.5-0.025 MG tablet Take 1 tablet by mouth 3 (three) times daily. Patient taking differently: Take 1 tablet by mouth 3 (three) times daily as needed for diarrhea or loose stools.  09/17/18  Yes Debbe Odea, MD    ketoconazole (NIZORAL) 2 % shampoo Apply 1 application topically 2 (two) times a week.   Yes [provider]  loratadine (CLARITIN) 10 MG tablet Take 1 tablet (10 mg total) by mouth daily as needed for allergies. 09/17/18  Yes Debbe Odea, MD  Polyethyl Glycol-Propyl Glycol (SYSTANE ULTRA PF OP) Place 2 drops into both eyes daily as needed (dry eyes).    Yes [provider]  potassium chloride SA (K-DUR,KLOR-CON) 20 MEQ tablet Take 1 tablet (20 mEq total) by mouth daily. 09/24/18  Yes Josue Hector, MD  torsemide (DEMADEX) 20 MG tablet Take 2 tablets (40 mg total) by mouth daily. 09/27/18  Yes Josue Hector, MD  triamcinolone ointment (KENALOG) 0.1 % Apply 1 application topically 2 (two) times daily. Use qd -bid Patient taking differently: Apply 1 application topically 2 (two) times daily as needed (on affected area of skin).  05/06/18  Yes Plotnikov, Evie Lacks, MD  benzonatate (TESSALON) 100 MG capsule Take 1 capsule (100 mg total) by mouth 3 (three) times daily as needed. Patient not taking: Reported on 10/08/2018 09/27/18   Marrian Salvage, FNP  dextromethorphan-guaiFENesin [facility] DM) 30-600 MG 12hr tablet Take 1 tablet by mouth 2 (two) times daily as needed for cough. Patient not taking: Reported on 10/08/2018 09/17/18   Debbe Odea, MD  vitamin C (ASCORBIC ACID) 500 MG tablet Take 1 tablet (500 mg total) by mouth daily. Patient not taking: Reported on 10/08/2018 03/14/16   Plotnikov, Evie Lacks, MD    Family History Family History  Problem Relation Age of Onset  . Dementia Mother   . Heart disease Mother   . Mental retardation Mother   . Hypertension Mother   . Alzheimer's disease Mother   . Heart attack Father   . Alcohol abuse Father   . Prostate cancer Brother        dx >50  . Bladder Cancer Brother        dx >50  . Diabetes Other   . Brain cancer Maternal Aunt 70  . Brain cancer Maternal Uncle 40  . Lung cancer Paternal Uncle   . Heart attack Maternal  Grandmother   . Heart attack Maternal Grandfather   . Lymphoma Other 38       NH  . Skin cancer Other   . Colon cancer Neg Hx   . Anesthesia problems Neg Hx     Social History Social History   Tobacco Use  . Smoking status: Former Smoker  Last attempt to quit: 12/20/1980    Years since quitting: 37.8  . Smokeless tobacco: Never Used  Substance Use Topics  . Alcohol use: Not Currently  . Drug use: No     Allergies   Cefuroxime; Celecoxib; Deltasone [prednisone]; Ranitidine; Spironolactone; Tapazole [methimazole]; Tramadol hcl; and Entresto [sacubitril-valsartan]   Review of Systems Review of Systems  Constitutional: Negative for chills and fever.  Respiratory: Negative for shortness of breath.   Cardiovascular: Positive for leg swelling. Negative for chest pain.  Gastrointestinal: Negative for abdominal pain, nausea and vomiting.  Skin: Positive for color change.  All other systems reviewed and are negative.    Physical Exam Updated Vital Signs BP (!) 140/105   Pulse (!) 145   Temp 97.7 F (36.5 C) (Oral)   Resp (!) 21   Ht 5\' 3"  (1.6 m)   Wt 54.5 kg   SpO2 98%   BMI 21.29 kg/m   Physical Exam Vitals signs and nursing note reviewed.  Constitutional:      General: She is not in acute distress.    Appearance: She is well-developed.  HENT:     Head: Normocephalic and atraumatic.  Eyes:     General:        Right eye: No discharge.        Left eye: No discharge.     Conjunctiva/sclera: Conjunctivae normal.  Neck:     Musculoskeletal: Normal range of motion and neck supple.     Vascular: No JVD.     Trachea: No tracheal deviation.  Cardiovascular:     Rate and Rhythm: Tachycardia present. Rhythm irregular.     Comments: See below images.  3+ pitting edema of the left lower extremity, 2+ pitting edema of the right lower extremity.  Diffuse erythema of the left lower leg with a wound weeping serous fluid.  Homans sign present bilaterally.  Chronic venous  stasis skin changes noted bilaterally. Pulmonary:     Effort: Pulmonary effort is normal.     Comments: Globally diminished breath sounds.  SPO2 saturations down to 88% on room air while speaking to me. Improved to 98% on 2L [place] Abdominal:     General: Abdomen is flat. There is no distension.     Tenderness: There is no abdominal tenderness. There is no guarding or rebound.  Skin:    General: Skin is warm and dry.     Findings: No erythema.  Neurological:     Mental Status: She is alert.  Psychiatric:        Behavior: Behavior normal.        ED Treatments / Results  Labs (all labs ordered are listed, but only abnormal results are displayed) Labs Reviewed  BASIC METABOLIC PANEL - Abnormal; Notable for the following components:      Result Value   BUN 32 (*)    Creatinine, Ser 1.29 (*)    Calcium 8.3 (*)    GFR calc non Af Amer 37 (*)    GFR calc Af Amer 43 (*)    All other components within normal limits  CBC WITH DIFFERENTIAL/PLATELET - Abnormal; Notable for the following components:   MCV 102.4 (*)    RDW 17.3 (*)    All other components within normal limits  LACTIC ACID, PLASMA    EKG EKG Interpretation  Date/Time:  Tuesday October 08 2018 21:51:38 EST Ventricular Rate:  84 PR Interval:    QRS Duration: 108 QT Interval:  391 QTC Calculation: 463 R Axis:   -  100 Text Interpretation:  Atrial fibrillation Incomplete RBBB and LAFB Probable right ventricular hypertrophy Probable anterolateral infarct, age indeterm Baseline wander in lead(s) V2 since last tracing no significant change Confirmed by Daleen Bo [phone]) on 10/08/2018 11:36:57 PM   Radiology Dg Chest 2 View  Result Date: 10/08/2018 CLINICAL DATA:  Hypoxia EXAM: CHEST - 2 VIEW COMPARISON:  09/16/2018, 06/18/2018, 06/06/2016, ultrasound 09/05/2013 FINDINGS: Cardiomegaly. No pleural effusion. Moderate hiatal hernia. Aortic atherosclerosis. Left paratracheal opacity, likely corresponding to enlarged  thyroid on prior ultrasound IMPRESSION: No active cardiopulmonary disease. Cardiomegaly. Large hiatal hernia Electronically Signed   By: Donavan Foil M.D.   On: 10/08/2018 22:49    Procedures Procedures (including critical care time)  Medications Ordered in ED Medications  metoprolol tartrate (LOPRESSOR) injection 5 mg (has no administration in time range)  vancomycin (VANCOCIN) 1,250 mg in sodium chloride 0.9 % 250 mL IVPB (has no administration in time range)     Initial Impression / Assessment and Plan / ED Course  I have reviewed the triage vital signs and the nursing notes.  Pertinent labs & imaging results that were available during my care of the patient were reviewed by me and considered in my medical decision making (see chart for details).        Patient presenting for evaluation of worsening left lower extremity wound.  She is afebrile, persistently tachycardic in the ED especially with any movement or palpation of her lower leg - found to be in A-fib with RVR. Will give dose of metoprolol. Also hypoxic to 88% on RA on my assessment, improved to 98% on 2L [place]. Labwork reviewed by me shows no leukocytosis, no metabolic derangements. Lactic acid negative. Renal insufficiency around the patient's baseline. Chest xray shows no acute cardiopulmonary abnormalities, stable cardiomegaly. Does not appear septic at this time. With worsening cellulitis with A-fib w/RVR and low O2 saturations, will require admission for further evaluation and management. Broad spectrum antibiotics initiated. A DVT study can be obtained in the morning when vascular ultrasound is available. Patient seen and evaluated by Dr. Eulis Foster who agrees with assessment and plan at this time. Care handed off to [facility] for anticipated admission to hospitalist service.   Final Clinical Impressions(s) / ED Diagnoses   Final diagnoses:  Recurrent cellulitis of lower extremity  Atrial fibrillation with RVR [facility])  Hypoxia      ED Discharge Orders    None       Renita Papa, PA-C 10/08/18 2346    Daleen Bo, MD 10/09/18 0031

## 2018-10-09 ENCOUNTER — Ambulatory Visit: Payer: Medicare Other | Admitting: Cardiovascular Disease

## 2018-10-09 ENCOUNTER — Other Ambulatory Visit: Payer: Self-pay

## 2018-10-09 DIAGNOSIS — L0390 Cellulitis, unspecified: Secondary | ICD-10-CM

## 2018-10-09 DIAGNOSIS — Z85828 Personal history of other malignant neoplasm of skin: Secondary | ICD-10-CM | POA: Diagnosis not present

## 2018-10-09 DIAGNOSIS — Z853 Personal history of malignant neoplasm of breast: Secondary | ICD-10-CM | POA: Diagnosis not present

## 2018-10-09 DIAGNOSIS — B9562 Methicillin resistant Staphylococcus aureus infection as the cause of diseases classified elsewhere: Secondary | ICD-10-CM | POA: Diagnosis present

## 2018-10-09 DIAGNOSIS — L03116 Cellulitis of left lower limb: Secondary | ICD-10-CM | POA: Diagnosis present

## 2018-10-09 DIAGNOSIS — L03119 Cellulitis of unspecified part of limb: Secondary | ICD-10-CM | POA: Diagnosis not present

## 2018-10-09 DIAGNOSIS — I130 Hypertensive heart and chronic kidney disease with heart failure and stage 1 through stage 4 chronic kidney disease, or unspecified chronic kidney disease: Secondary | ICD-10-CM | POA: Diagnosis present

## 2018-10-09 DIAGNOSIS — E785 Hyperlipidemia, unspecified: Secondary | ICD-10-CM | POA: Diagnosis present

## 2018-10-09 DIAGNOSIS — Z885 Allergy status to narcotic agent status: Secondary | ICD-10-CM | POA: Diagnosis not present

## 2018-10-09 DIAGNOSIS — M810 Age-related osteoporosis without current pathological fracture: Secondary | ICD-10-CM | POA: Diagnosis present

## 2018-10-09 DIAGNOSIS — F419 Anxiety disorder, unspecified: Secondary | ICD-10-CM | POA: Diagnosis present

## 2018-10-09 DIAGNOSIS — Z9012 Acquired absence of left breast and nipple: Secondary | ICD-10-CM | POA: Diagnosis not present

## 2018-10-09 DIAGNOSIS — J432 Centrilobular emphysema: Secondary | ICD-10-CM | POA: Diagnosis present

## 2018-10-09 DIAGNOSIS — Z90710 Acquired absence of both cervix and uterus: Secondary | ICD-10-CM | POA: Diagnosis not present

## 2018-10-09 DIAGNOSIS — R5381 Other malaise: Secondary | ICD-10-CM | POA: Diagnosis present

## 2018-10-09 DIAGNOSIS — I4891 Unspecified atrial fibrillation: Secondary | ICD-10-CM | POA: Diagnosis not present

## 2018-10-09 DIAGNOSIS — I5022 Chronic systolic (congestive) heart failure: Secondary | ICD-10-CM | POA: Diagnosis present

## 2018-10-09 DIAGNOSIS — K589 Irritable bowel syndrome without diarrhea: Secondary | ICD-10-CM | POA: Diagnosis present

## 2018-10-09 DIAGNOSIS — Z87891 Personal history of nicotine dependence: Secondary | ICD-10-CM | POA: Diagnosis not present

## 2018-10-09 DIAGNOSIS — Z96653 Presence of artificial knee joint, bilateral: Secondary | ICD-10-CM | POA: Diagnosis present

## 2018-10-09 DIAGNOSIS — N183 Chronic kidney disease, stage 3 (moderate): Secondary | ICD-10-CM | POA: Diagnosis present

## 2018-10-09 DIAGNOSIS — D631 Anemia in chronic kidney disease: Secondary | ICD-10-CM | POA: Diagnosis present

## 2018-10-09 DIAGNOSIS — L03115 Cellulitis of right lower limb: Secondary | ICD-10-CM | POA: Diagnosis present

## 2018-10-09 DIAGNOSIS — I482 Chronic atrial fibrillation, unspecified: Secondary | ICD-10-CM | POA: Diagnosis present

## 2018-10-09 DIAGNOSIS — J90 Pleural effusion, not elsewhere classified: Secondary | ICD-10-CM | POA: Diagnosis not present

## 2018-10-09 DIAGNOSIS — Z8542 Personal history of malignant neoplasm of other parts of uterus: Secondary | ICD-10-CM | POA: Diagnosis not present

## 2018-10-09 DIAGNOSIS — J9601 Acute respiratory failure with hypoxia: Secondary | ICD-10-CM | POA: Diagnosis present

## 2018-10-09 DIAGNOSIS — J9811 Atelectasis: Secondary | ICD-10-CM | POA: Diagnosis not present

## 2018-10-09 DIAGNOSIS — K449 Diaphragmatic hernia without obstruction or gangrene: Secondary | ICD-10-CM | POA: Diagnosis present

## 2018-10-09 MED ORDER — ONDANSETRON HCL 4 MG/2ML IJ SOLN: 4.0000 mg | Freq: Four times a day (QID) | INTRAMUSCULAR | Status: DC | PRN

## 2018-10-09 MED ORDER — ALPRAZOLAM 0.5 MG PO TABS
0.5000 mg | ORAL_TABLET | Freq: Once | ORAL | Status: AC
  Administered 2018-10-09: 0.5 mg via ORAL
  Filled 2018-10-09: qty 1

## 2018-10-09 MED ORDER — ACETAMINOPHEN 650 MG RE SUPP: 650.0000 mg | Freq: Four times a day (QID) | RECTAL | Status: DC | PRN

## 2018-10-09 MED ORDER — POLYETHYL GLYC-PROPYL GLYC PF 0.4-0.3 % OP SOLN: Freq: Every day | OPHTHALMIC | Status: DC | PRN

## 2018-10-09 MED ORDER — ALPRAZOLAM 0.5 MG PO TABS
0.5000 mg | ORAL_TABLET | Freq: Every evening | ORAL | Status: DC | PRN
  Administered 2018-10-09 – 2018-10-14 (×8): 0.5 mg via ORAL
  Filled 2018-10-09 (×8): qty 1

## 2018-10-09 MED ORDER — ONDANSETRON HCL 4 MG PO TABS: 4.0000 mg | ORAL_TABLET | Freq: Four times a day (QID) | ORAL | Status: DC | PRN

## 2018-10-09 MED ORDER — TORSEMIDE 20 MG PO TABS
40.0000 mg | ORAL_TABLET | Freq: Every day | ORAL | Status: DC
  Administered 2018-10-09 – 2018-10-15 (×7): 40 mg via ORAL
  Filled 2018-10-09 (×8): qty 2

## 2018-10-09 MED ORDER — VANCOMYCIN HCL 500 MG IV SOLR
500.0000 mg | Freq: Every day | INTRAVENOUS | Status: DC
  Administered 2018-10-10 – 2018-10-13 (×4): 500 mg via INTRAVENOUS
  Filled 2018-10-09 (×4): qty 500

## 2018-10-09 MED ORDER — POTASSIUM CHLORIDE CRYS ER 20 MEQ PO TBCR
20.0000 meq | EXTENDED_RELEASE_TABLET | Freq: Every day | ORAL | Status: DC
  Administered 2018-10-09 – 2018-10-15 (×7): 20 meq via ORAL
  Filled 2018-10-09 (×7): qty 1

## 2018-10-09 MED ORDER — POLYVINYL ALCOHOL 1.4 % OP SOLN
1.0000 [drp] | Freq: Every day | OPHTHALMIC | Status: DC | PRN
  Filled 2018-10-09: qty 15

## 2018-10-09 MED ORDER — CARVEDILOL 3.125 MG PO TABS
3.1250 mg | ORAL_TABLET | Freq: Two times a day (BID) | ORAL | Status: DC
  Administered 2018-10-09 – 2018-10-15 (×12): 3.125 mg via ORAL
  Filled 2018-10-09 (×12): qty 1

## 2018-10-09 MED ORDER — ACETAMINOPHEN 325 MG PO TABS: 650.0000 mg | ORAL_TABLET | Freq: Four times a day (QID) | ORAL | Status: DC | PRN

## 2018-10-09 MED ORDER — APIXABAN 2.5 MG PO TABS
2.5000 mg | ORAL_TABLET | Freq: Two times a day (BID) | ORAL | Status: DC
  Administered 2018-10-09 – 2018-10-15 (×13): 2.5 mg via ORAL
  Filled 2018-10-09 (×13): qty 1

## 2018-10-09 NOTE — ED Notes (Signed)
ED TO INPATIENT HANDOFF REPORT  Name/Age/Gender Mia Contreras 85 y.o. female  Code Status    Code Status Orders  (From admission, onward)         Start     Ordered   10/09/18 0555  Limited resuscitation (code)  Continuous    Question Answer Comment  In the event of cardiac or respiratory ARREST: Initiate Code Blue, Call Rapid Response Yes   In the event of cardiac or respiratory ARREST: Perform CPR Yes   In the event of cardiac or respiratory ARREST: Perform Intubation/Mechanical Ventilation No   In the event of cardiac or respiratory ARREST: Use NIPPV/BiPAp only if indicated Yes   In the event of cardiac or respiratory ARREST: Administer ACLS medications if indicated Yes   In the event of cardiac or respiratory ARREST: Perform Defibrillation or Cardioversion if indicated Yes      10/09/18 0556        Code Status History    Date Active Date Inactive Code Status Order ID Comments User Context   09/16/2018 1656 09/17/2018 1429 Partial Code [id number]  Debbe Odea, MD ED   06/19/2018 0152 06/20/2018 2050 Partial Code [id number]  Che[place] Lions, RN Inpatient   06/19/2018 0010 06/19/2018 0152 DNR [id number]  Etta Quill, DO ED   06/06/2016 2311 06/12/2016 1641 DNR [id number]  Etta Quill, DO ED   06/06/2016 2119 06/06/2016 2311 DNR [id number]  Corey Harold, NP ED   01/26/2016 1348 02/07/2016 1912 Full Code [id number]  Conrad [place], NP Inpatient   12/14/2015 1330 12/19/2015 1919 Full Code [id number]  Waldemar Dickens, MD Inpatient   10/30/2011 1213 11/02/2011 1710 Full Code [id number]  Rande Lawman, RN Inpatient    Advance Directive Documentation     Most Recent Value  Type of Advance Directive  Healthcare Power of Attorney, Living will  Pre-existing out of facility DNR order (yellow form or pink MOST form)  -  "MOST" Form in Place?  -      Home/SNF/Other Home  Chief Complaint left leg pain   Level of Care/Admitting Diagnosis ED Disposition    ED Disposition  Condition [place]: [facility] [100102]  Level of Care: Med-Surg [16]  Diagnosis: Cellulitis due to methicillin-resistant Staphylococcus aureus (MRSA) [456256]  Admitting Physician: Doreatha Massed  Attending Physician: Etta Quill [phone]  Estimated length of stay: past midnight tomorrow  Certification:: I certify this patient will need inpatient services for at least 2 midnights  PT Class (Do Not Modify): Inpatient [101]  PT Acc Code (Do Not Modify): Private [1]       Medical History Past Medical History:  Diagnosis Date  . AKI (acute kidney injury) ([place]) 01/2016  . ANEMIA-NOS 08/20/2007  . ANXIETY 03/16/2007  . BREAST CANCER, HX OF 03/16/2007  . CARPAL TUNNEL SYNDROME, RIGHT 01/11/2008  . Cataract    floaters  . CELLULITIS, LEG, RIGHT 08/20/2007  . CHF (congestive heart failure) ([place])   . Collagenous colitis   . Congestive heart failure with right ventricular systolic dysfunction ([place]) 06/19/2018  . COPD (chronic obstructive pulmonary disease) ([place])   . DIVERTICULOSIS, COLON 08/20/2007  . Dyspnea    on exertion  . Dysrhythmia    atrial fib  . Family history of brain tumor   . Family history of lung cancer   . Family history of lymphoma   . Family history of prostate cancer   . Family history of  skin cancer   . GOITER, MULTINODULAR 05/06/2010   Dr [name]  . HYPERLIPIDEMIA 08/20/2007  . HYPERTENSION 03/16/2007  . HYPERTHYROIDISM 05/23/2010  . IBS (irritable bowel syndrome)   . OSTEOARTHRITIS 03/16/2007  . OSTEOPOROSIS 08/20/2007  . Personal history of breast cancer 07/01/2018  . Pneumonia 07/2011   took abx for several weeks  . PREMATURE ATRIAL CONTRACTIONS 09/21/2010  . SCOLIOSIS 01/11/2008  . Severe tricuspid regurgitation 06/19/2018  . SKIN CANCER, HX OF 11/12/2008    R cheek 2010, L Cheek 2011 Dr. Tonia Brooms  . Thyroid nodule   . Uterine cancer ([place])   . VENOUS INSUFFICIENCY 09/05/2007    Allergies Allergies   Allergen Reactions  . Cefuroxime Diarrhea  . Celecoxib Nausea Only  . Deltasone [Prednisone] Swelling    Oral deltasone is causing swelling (can use Depo-Medrol)  . Ranitidine Other (See Comments)    bloating  . Spironolactone Other (See Comments)    High K  . Tapazole [Methimazole] Rash  . Tramadol Hcl Nausea Only  . Entresto [Sacubitril-Valsartan] Other (See Comments)    Unknown reaction per pt    IV Location/Drains/Wounds Patient Lines/Drains/Airways Status   Active Line/Drains/Airways    Name:   Placement date:   Placement time:   Site:   Days:   Peripheral IV 10/09/18 Right Forearm   10/09/18    0244    Forearm   less than 1   Incision 10/30/11 Leg Left   10/30/11    0921     2536   Incision (Closed) 06/11/18 Breast Left   06/11/18    1006     120   Wound / Incision (Open or Dehisced) 06/07/16 Other (Comment) Leg Left Unnas boot in place, UTA   06/07/16    0253    Leg   854   Wound / Incision (Open or Dehisced) 06/07/16 Other (Comment) Leg Right Unnas boot in place, UTA   06/07/16    0253    Leg   854   Wound / Incision (Open or Dehisced) 09/16/18 Pretibial Left   09/16/18    1812    Pretibial   23          Labs/Imaging Results for orders placed or performed during the hospital encounter of 10/08/18 (from the past 48 hour(s))  Basic metabolic panel     Status: Abnormal   Collection Time: 10/08/18  9:39 PM  Result Value Ref Range   Sodium 139 135 - 145 mmol/L   Potassium 4.5 3.5 - 5.1 mmol/L   Chloride 101 98 - 111 mmol/L   CO2 28 22 - 32 mmol/L   Glucose, Bld 93 70 - 99 mg/dL   BUN 32 (H) 8 - 23 mg/dL   Creatinine, Ser 1.29 (H) 0.44 - 1.00 mg/dL   Calcium 8.3 (L) 8.9 - 10.3 mg/dL   GFR calc non Af Amer 37 (L) >60 mL/min   GFR calc Af Amer 43 (L) >60 mL/min   Anion gap 10 5 - 15    Comment: Performed at [facility], [place] [address], [place], [place] [address]  CBC with Differential     Status: Abnormal   Collection Time: 10/08/18  9:39 PM   Result Value Ref Range   WBC 6.1 4.0 - 10.5 K/uL   RBC 4.12 3.87 - 5.11 MIL/uL   Hemoglobin 12.7 12.0 - 15.0 g/dL   HCT 42.2 36.0 - 46.0 %   MCV 102.4 (H) 80.0 - 100.0 fL   MCH  30.8 26.0 - 34.0 pg   MCHC 30.1 30.0 - 36.0 g/dL   RDW 17.3 (H) 11.5 - 15.5 %   Platelets 198 150 - 400 K/uL   nRBC 0.0 0.0 - 0.2 %   Neutrophils Relative % 61 %   Neutro Abs 3.7 1.7 - 7.7 K/uL   Lymphocytes Relative 21 %   Lymphs Abs 1.3 0.7 - 4.0 K/uL   Monocytes Relative 15 %   Monocytes Absolute 0.9 0.1 - 1.0 K/uL   Eosinophils Relative 2 %   Eosinophils Absolute 0.1 0.0 - 0.5 K/uL   Basophils Relative 1 %   Basophils Absolute 0.1 0.0 - 0.1 K/uL   Immature Granulocytes 0 %   Abs Immature Granulocytes 0.02 0.00 - 0.07 K/uL    Comment: Performed at [facility], [place] [address], [place], [place] [address]  Lactic acid, plasma     Status: None   Collection Time: 10/08/18  9:39 PM  Result Value Ref Range   Lactic Acid, Venous 1.0 0.5 - 1.9 mmol/L    Comment: Performed at [facility], [place] [address] [place], [place] [address]   Dg Chest 2 View  Result Date: 10/08/2018 CLINICAL DATA:  Hypoxia EXAM: CHEST - 2 VIEW COMPARISON:  09/16/2018, 06/18/2018, 06/06/2016, ultrasound 09/05/2013 FINDINGS: Cardiomegaly. No pleural effusion. Moderate hiatal hernia. Aortic atherosclerosis. Left paratracheal opacity, likely corresponding to enlarged thyroid on prior ultrasound IMPRESSION: No active cardiopulmonary disease. Cardiomegaly. Large hiatal hernia Electronically Signed   By: Donavan Foil M.D.   On: 10/08/2018 22:49   EKG Interpretation  Date/Time:  Tuesday October 08 2018 21:51:38 EST Ventricular Rate:  84 PR Interval:    QRS Duration: 108 QT Interval:  391 QTC Calculation: 463 R Axis:   -100 Text Interpretation:  Duplicate Confirmed by Daleen Bo [phone]) on 10/08/2018 11:39:08 PM   Pending Labs Unresulted Labs (From admission, onward)    Start     Ordered    10/10/18 0500  CBC  Tomorrow morning,   R     10/09/18 0556   10/10/18 7673  Basic metabolic panel  Tomorrow morning,   R     10/09/18 0556   10/09/18 0610  Aerobic Culture (superficial specimen)  Once,   R     10/09/18 0609          Vitals/Pain Today's Vitals   10/09/18 0400 10/09/18 0500 10/09/18 0530 10/09/18 0630  BP: 118/66 (!) 102/55 118/78 121/85  Pulse: 97 78 97 72  Resp: (!) 26 17 (!) 24 16  Temp:      TempSrc:      SpO2: 96% 94% 95% 95%  Weight:      Height:      PainSc:        Isolation Precautions No active isolations  Medications Medications  apixaban (ELIQUIS) tablet 2.5 mg (has no administration in time range)  torsemide (DEMADEX) tablet 40 mg (has no administration in time range)  potassium chloride SA (K-DUR,KLOR-CON) CR tablet 20 mEq (has no administration in time range)  carvedilol (COREG) tablet 3.125 mg (has no administration in time range)  ALPRAZolam (XANAX) tablet 0.5-1 mg (has no administration in time range)  vancomycin (VANCOCIN) 500 mg in sodium chloride 0.9 % 100 mL IVPB (has no administration in time range)  acetaminophen (TYLENOL) tablet 650 mg (has no administration in time range)    Or  acetaminophen (TYLENOL) suppository 650 mg (has no administration in time range)  ondansetron (ZOFRAN) tablet 4 mg (  has no administration in time range)    Or  ondansetron (ZOFRAN) injection 4 mg (has no administration in time range)  polyvinyl alcohol (LIQUIFILM TEARS) 1.4 % ophthalmic solution 1 drop (has no administration in time range)  metoprolol tartrate (LOPRESSOR) injection 5 mg (5 mg Intravenous Given 10/09/18 0015)  vancomycin (VANCOCIN) 1,250 mg in sodium chloride 0.9 % 250 mL IVPB (0 mg Intravenous Stopped 10/09/18 0230)  ALPRAZolam (XANAX) tablet 0.5 mg (0.5 mg Oral Given 10/09/18 0248)    Mobility walks with device

## 2018-10-09 NOTE — ED Provider Notes (Signed)
Care signed out from [facility], PA-C at shift change pending admission for further treatment of LLE wound infection and episodes of afib with rvr with intermittent hypoxia.   5:10 AM Discussed case with Dr. Alcario Drought who will admit the patient.    Rodney Booze, PA-C 10/09/18 8737    Fatima Blank, MD 10/09/18 [phone]

## 2018-10-09 NOTE — Consult Note (Signed)
[facility] Nurse wound consult note Reason for Consult:Nonhealing wound to left anterior leg below knee.  Seen at wound care center.  Wound type:Trauma/infectious Pressure Injury POA: NA Measurement: 4 cm x 3.4 cm x 0.3 cm  Wound YEM:VVKPQ red Drainage (amount, consistency, odor) minimal serosanguinous   Periwound:chronic skin changes   Dressing procedure/placement/frequency:Cleanse wound to left anterior lower leg with NS and pat dry.  Apply NS moist Aquacel Ag to wound bed.  Cover with gauze and kerlix.  Change M/W/F Will not follow at this time.  Please re-consult if needed.  Domenic Moras MSN, RN, FNP-BC CWON Wound, Ostomy, Continence Nurse Pager [phone]

## 2018-10-09 NOTE — ED Notes (Signed)
Report given to [facility] on 5W

## 2018-10-09 NOTE — Progress Notes (Signed)
Pharmacy Antibiotic Note  Mia Contreras is a 85 y.o. female admitted on 10/08/2018 with wound infection.  Pharmacy has been consulted for vancomycin dosing.  Plan: Vancomycin 1250 mg x1 then 500 mg IV q24h for est AUC = 499 Goal AUC = 400-550 F/u scr/cultures/levels  Height: 5\' 3"  (160 cm) Weight: 120 lb 3.2 oz (54.5 kg) IBW/kg (Calculated) : 52.4  Temp (24hrs), Avg:97.7 F (36.5 C), Min:97.7 F (36.5 C), Max:97.7 F (36.5 C)  Recent Labs  Lab 10/08/18 2139  WBC 6.1  CREATININE 1.29*  LATICACIDVEN 1.0    Estimated Creatinine Clearance: 25.4 mL/min (A) (by C-G formula based on SCr of 1.29 mg/dL (H)).    Allergies  Allergen Reactions  . Cefuroxime Diarrhea  . Celecoxib Nausea Only  . Deltasone [Prednisone] Swelling    Oral deltasone is causing swelling (can use Depo-Medrol)  . Ranitidine Other (See Comments)    bloating  . Spironolactone Other (See Comments)    High K  . Tapazole [Methimazole] Rash  . Tramadol Hcl Nausea Only  . Entresto [Sacubitril-Valsartan] Other (See Comments)    Unknown reaction per pt    Antimicrobials this admission: 2/19 vancomycin >>    >>   Dose adjustments this admission:   Microbiology results:  BCx:  UCx:    Sputum:    MRSA PCR:   Thank you for allowing pharmacy to be a part of this patient's care.  Dorrene German 10/09/2018 5:41 AM

## 2018-10-09 NOTE — Progress Notes (Signed)
Mia Contreras is a 85 y.o. female with medical history significant of CHF, A.Fib, HTN, BRCA.  Recent and recurrent cellulitis and ulcers of LLE that has been worsening despite seeing wound care with Dr. Dellia Nims for past 3 months or so.  Culture in Jan of wound grew MRSA.  They attempted to treat with doxycycline and then bactrim.  Unfortunately erythema has progressively worsened with worsening drainage of wound.  Patient sent in to ED for failed outpatient treatment.  Started on vancomycin.  Hospitalist asked to admit.  10/09/2018: Patient seen and examined at bedside.  Denies any pain at this time.  Currently on IV vancomycin for MRSA cellulitis.  Wound care specialist has been consulted.  Please refer to H&P dictated by Dr. Alcario Drought on 10/09/2018 for further details of the assessment and plan.

## 2018-10-09 NOTE — H&P (Signed)
History and Physical    Mia Contreras HKV:[id number] DOB: [birth year] DOA: 10/08/2018  PCP: Cassandria Anger, MD  Patient coming from: Home  I have personally briefly reviewed patient's old medical records in [place]  Chief Complaint: LLE wound infection  HPI: Mia Contreras is a 85 y.o. female with medical history significant of CHF, A.Fib, HTN, BRCA.  Recent and recurrent cellulitis and ulcers of LLE that has been worsening despite seeing wound care with Dr. Dellia Nims for past 3 months or so.  Culture in Jan of wound grew MRSA.  They attempted to treat with doxycycline and then bactrim.  Unfortunately erythema has progressively worsened with worsening drainage of wound.  Patient sent in to ED for failed outpatient treatment.   ED Course: Started on vancomycin.  Hospitalist asked to admit.   Review of Systems: As per HPI otherwise 10 point review of systems negative.   Past Medical History:  Diagnosis Date  . AKI (acute kidney injury) ([place]) 01/2016  . ANEMIA-NOS 08/20/2007  . ANXIETY 03/16/2007  . BREAST CANCER, HX OF 03/16/2007  . CARPAL TUNNEL SYNDROME, RIGHT 01/11/2008  . Cataract    floaters  . CELLULITIS, LEG, RIGHT 08/20/2007  . CHF (congestive heart failure) ([place])   . Collagenous colitis   . Congestive heart failure with right ventricular systolic dysfunction ([place]) 06/19/2018  . COPD (chronic obstructive pulmonary disease) ([place])   . DIVERTICULOSIS, COLON 08/20/2007  . Dyspnea    on exertion  . Dysrhythmia    atrial fib  . Family history of brain tumor   . Family history of lung cancer   . Family history of lymphoma   . Family history of prostate cancer   . Family history of skin cancer   . GOITER, MULTINODULAR 05/06/2010   Dr [name]  . HYPERLIPIDEMIA 08/20/2007  . HYPERTENSION 03/16/2007  . HYPERTHYROIDISM 05/23/2010  . IBS (irritable bowel syndrome)   . OSTEOARTHRITIS 03/16/2007  . OSTEOPOROSIS 08/20/2007  . Personal history of breast cancer  07/01/2018  . Pneumonia 07/2011   took abx for several weeks  . PREMATURE ATRIAL CONTRACTIONS 09/21/2010  . SCOLIOSIS 01/11/2008  . Severe tricuspid regurgitation 06/19/2018  . SKIN CANCER, HX OF 11/12/2008    R cheek 2010, L Cheek 2011 Dr. Tonia Brooms  . Thyroid nodule   . Uterine cancer ([place])   . VENOUS INSUFFICIENCY 09/05/2007    Past Surgical History:  Procedure Laterality Date  . ABDOMINAL HYSTERECTOMY    . APPENDECTOMY  1972  . BREAST LUMPECTOMY Left 2001  . BREAST LUMPECTOMY WITH RADIOACTIVE SEED LOCALIZATION Left 06/11/2018   Procedure: LEFT BREAST LUMPECTOMY WITH BRACKETED RADIOACTIVE SEED LOCALIZATION;  Surgeon: Fanny Skates, MD;  Location: [facility];  Service: General;  Laterality: Left;  . CARPAL TUNNEL RELEASE    . CATARACT EXTRACTION    . EYE SURGERY     cataract ext  . HERNIA REPAIR    . MASTECTOMY, PARTIAL Left   . SKIN CANCER EXCISION     face-Dr. Bing Plume  . TOTAL KNEE ARTHROPLASTY    . TOTAL KNEE ARTHROPLASTY Right 2010   Dr Para March  . TOTAL KNEE ARTHROPLASTY  10/30/2011   Procedure: TOTAL KNEE ARTHROPLASTY;  Surgeon: Lorn Junes, MD;  Location: [facility];  Service: Orthopedics;  Laterality: Left;     . XRT     chemo, surgery     reports that she quit smoking about 37 years ago. She has never used smokeless tobacco. She reports previous alcohol use. She  reports that she does not use drugs.  Allergies  Allergen Reactions  . Cefuroxime Diarrhea  . Celecoxib Nausea Only  . Deltasone [Prednisone] Swelling    Oral deltasone is causing swelling (can use Depo-Medrol)  . Ranitidine Other (See Comments)    bloating  . Spironolactone Other (See Comments)    High K  . Tapazole [Methimazole] Rash  . Tramadol Hcl Nausea Only  . Entresto [Sacubitril-Valsartan] Other (See Comments)    Unknown reaction per pt    Family History  Problem Relation Age of Onset  . Dementia Mother   . Heart disease Mother   . Mental retardation Mother   . Hypertension Mother   .  Alzheimer's disease Mother   . Heart attack Father   . Alcohol abuse Father   . Prostate cancer Brother        dx >50  . Bladder Cancer Brother        dx >50  . Diabetes Other   . Brain cancer Maternal Aunt 70  . Brain cancer Maternal Uncle 40  . Lung cancer Paternal Uncle   . Heart attack Maternal Grandmother   . Heart attack Maternal Grandfather   . Lymphoma Other 38       NH  . Skin cancer Other   . Colon cancer Neg Hx   . Anesthesia problems Neg Hx      Prior to Admission medications   Medication Sig Start Date End Date Taking? Authorizing Provider  ALPRAZolam Duanne Moron) 0.5 MG tablet Take 1 tablet (0.5 mg total) by mouth 2 (two) times daily as needed for anxiety. Patient taking differently: Take 0.5-1 mg by mouth at bedtime as needed for anxiety or sleep.  07/02/18  Yes Plotnikov, Evie Lacks, MD  apixaban (ELIQUIS) 2.5 MG TABS tablet Take 1 tablet (2.5 mg total) by mouth 2 (two) times daily. 03/12/18  Yes Bensimhon, Shaune Pascal, MD  carvedilol (COREG) 3.125 MG tablet Take 1 tablet (3.125 mg total) by mouth 2 (two) times daily with a meal. 02/11/18  Yes Josue Hector, MD  coal tar (NEUTROGENA T-GEL) 0.5 % shampoo Apply 1 application topically once a week.    Yes [provider]  diphenoxylate-atropine (LOMOTIL) 2.5-0.025 MG tablet Take 1 tablet by mouth 3 (three) times daily. Patient taking differently: Take 1 tablet by mouth 3 (three) times daily as needed for diarrhea or loose stools.  09/17/18  Yes Debbe Odea, MD  ketoconazole (NIZORAL) 2 % shampoo Apply 1 application topically 2 (two) times a week.   Yes [provider]  loratadine (CLARITIN) 10 MG tablet Take 1 tablet (10 mg total) by mouth daily as needed for allergies. 09/17/18  Yes Debbe Odea, MD  Polyethyl Glycol-Propyl Glycol (SYSTANE ULTRA PF OP) Place 2 drops into both eyes daily as needed (dry eyes).    Yes [provider]  potassium chloride SA (K-DUR,KLOR-CON) 20 MEQ tablet Take 1 tablet (20  mEq total) by mouth daily. 09/24/18  Yes Josue Hector, MD  torsemide (DEMADEX) 20 MG tablet Take 2 tablets (40 mg total) by mouth daily. 09/27/18  Yes Josue Hector, MD  triamcinolone ointment (KENALOG) 0.1 % Apply 1 application topically 2 (two) times daily. Use qd -bid Patient taking differently: Apply 1 application topically 2 (two) times daily as needed (on affected area of skin).  05/06/18  Yes Plotnikov, Evie Lacks, MD    Physical Exam: Vitals:   10/09/18 0230 10/09/18 0245 10/09/18 0330 10/09/18 0400  BP: 106/68  103/62  118/66  Pulse:  84 (!) 53 97  Resp: (!) 25 (!) 26 (!) 23 (!) 26  Temp:      TempSrc:      SpO2:  95% 94% 96%  Weight:      Height:        Constitutional: NAD, calm, comfortable Eyes: PERRL, lids and conjunctivae normal ENMT: Mucous membranes are moist. Posterior pharynx clear of any exudate or lesions.Normal dentition.  Neck: normal, supple, no masses, no thyromegaly Respiratory: clear to auscultation bilaterally, no wheezing, no crackles. Normal respiratory effort. No accessory muscle use.  Cardiovascular: Regular rate and rhythm, no murmurs / rubs / gallops. No extremity edema. 2+ pedal pulses. No carotid bruits.  Abdomen: no tenderness, no masses palpated. No hepatosplenomegaly. Bowel sounds positive.  Musculoskeletal: no clubbing / cyanosis. No joint deformity upper and lower extremities. Good ROM, no contractures. Normal muscle tone.  Skin:  Neurologic: CN 2-12 grossly intact. Sensation intact, DTR normal. Strength 5/5 in all 4.  Psychiatric: Normal judgment and insight. Alert and oriented x 3. Normal mood.    Labs on Admission: I have personally reviewed following labs and imaging studies  CBC: Recent Labs  Lab 10/08/18 2139  WBC 6.1  NEUTROABS 3.7  HGB 12.7  HCT 42.2  MCV 102.4*  PLT 045   Basic Metabolic Panel: Recent Labs  Lab 10/08/18 2139  NA 139  K 4.5  CL 101  CO2 28  GLUCOSE 93  BUN 32*  CREATININE 1.29*  CALCIUM 8.3*    GFR: Estimated Creatinine Clearance: 25.4 mL/min (A) (by C-G formula based on SCr of 1.29 mg/dL (H)). Liver Function Tests: No results for input(s): AST, ALT, ALKPHOS, BILITOT, PROT, ALBUMIN in the last 168 hours. No results for input(s): LIPASE, AMYLASE in the last 168 hours. No results for input(s): AMMONIA in the last 168 hours. Coagulation Profile: No results for input(s): INR, PROTIME in the last 168 hours. Cardiac Enzymes: No results for input(s): CKTOTAL, CKMB, CKMBINDEX, TROPONINI in the last 168 hours. BNP (last 3 results) Recent Labs    06/17/18 1544 07/23/18 1305 07/31/18 1118  PROBNP 1,573.0* 8,739* 7,046*   HbA1C: No results for input(s): HGBA1C in the last 72 hours. CBG: No results for input(s): GLUCAP in the last 168 hours. Lipid Profile: No results for input(s): CHOL, HDL, LDLCALC, TRIG, CHOLHDL, LDLDIRECT in the last 72 hours. Thyroid Function Tests: No results for input(s): TSH, T4TOTAL, FREET4, T3FREE, THYROIDAB in the last 72 hours. Anemia Panel: No results for input(s): VITAMINB12, FOLATE, FERRITIN, TIBC, IRON, RETICCTPCT in the last 72 hours. Urine analysis:    Component Value Date/Time   COLORURINE YELLOW 09/16/2018 1210   APPEARANCEUR HAZY (A) 09/16/2018 1210   LABSPEC 1.010 09/16/2018 1210   PHURINE 5.0 09/16/2018 1210   GLUCOSEU NEGATIVE 09/16/2018 1210   GLUCOSEU NEGATIVE 05/06/2018 1440   HGBUR NEGATIVE 09/16/2018 1210   BILIRUBINUR NEGATIVE 09/16/2018 1210   KETONESUR NEGATIVE 09/16/2018 1210   PROTEINUR NEGATIVE 09/16/2018 1210   UROBILINOGEN 0.2 05/06/2018 1440   NITRITE NEGATIVE 09/16/2018 1210   LEUKOCYTESUR MODERATE (A) 09/16/2018 1210    Radiological Exams on Admission: Dg Chest 2 View  Result Date: 10/08/2018 CLINICAL DATA:  Hypoxia EXAM: CHEST - 2 VIEW COMPARISON:  09/16/2018, 06/18/2018, 06/06/2016, ultrasound 09/05/2013 FINDINGS: Cardiomegaly. No pleural effusion. Moderate hiatal hernia. Aortic atherosclerosis. Left  paratracheal opacity, likely corresponding to enlarged thyroid on prior ultrasound IMPRESSION: No active cardiopulmonary disease. Cardiomegaly. Large hiatal hernia Electronically Signed   By: Madie Reno.D.  On: 10/08/2018 22:49    EKG: Independently reviewed.  Assessment/Plan Principal Problem:   Cellulitis due to methicillin-resistant Staphylococcus aureus (MRSA) Active Problems:   Chronic systolic heart failure (HCC)   Atrial fibrillation ([place])    1. Cellulitis of L leg with ulcers due to MRSA - 1. Will go ahead and repeat wound culture 2. But culture of wound did grow out MRSA last month 3. Treating for MRSA with vancomycin 4. Repeat CBC/BMP tomorrow AM 5. Wound care consult 2. Chronic systolic CHF - 1. Continue home torsemide 3. A.Fib - 1. Continue coreg for rate control 2. Continue eliquis  DVT prophylaxis: Eliquis Code Status: Partial code, DNI Family Communication: Daughter at bedside Disposition Plan: Home after admit Consults called: None Admission status: Admit to inpatient  Severity of Illness: The appropriate patient status for this patient is INPATIENT. Inpatient status is judged to be reasonable and necessary in order to provide the required intensity of service to ensure the patient's safety. The patient's presenting symptoms, physical exam findings, and initial radiographic and laboratory data in the context of their chronic comorbidities is felt to place them at high risk for further clinical deterioration. Furthermore, it is not anticipated that the patient will be medically stable for discharge from the hospital within 2 midnights of admission. The following factors support the patient status of inpatient.   MRSA skin infection of LLE wounds, this is progressive and worse despite outpatient therapy and attempts to treat with doxycycline and bactrim.  Patient requires inpatient management for initiation of vancomycin.  * I certify that at the point of  admission it is my clinical judgment that the patient will require inpatient hospital care spanning beyond 2 midnights from the point of admission due to high intensity of service, high risk for further deterioration and high frequency of surveillance required.*    GARDNER, JARED M. DO [facility]  How to contact the [facility] Attending or Consulting provider [place] or covering provider during after hours [place], for this patient?  1. Check the care team in [facility] and look for a) attending/consulting TR[facility] provider listed and b) the [facility] team listed 2. Log into [email or web address]  Amion Physician Scheduling and messaging for groups and whole hospitals  On call and physician scheduling software for group practices, residents, hospitalists and other medical providers for call, clinic, rotation and shift schedules. OnCall Enterprise is a hospital-wide system for scheduling doctors and paging doctors on call. EasyPlot is for scientific plotting and data analysis.  [email or web address]  and use [place]'s universal password to access. If you do not have the password, please contact the hospital operator.  3. Locate the [facility] provider you are looking for under [facility] and page to a number that you can be directly reached. 4. If you still have difficulty reaching the provider, please page the [facility] (Director on Call) for the Hospitalists listed on amion for assistance.  10/09/2018, 5:57 AM

## 2018-10-09 NOTE — Progress Notes (Signed)
RN from ED reported pt would be on the floor soon. O2 sats stable.

## 2018-10-10 DIAGNOSIS — B9562 Methicillin resistant Staphylococcus aureus infection as the cause of diseases classified elsewhere: Secondary | ICD-10-CM

## 2018-10-10 DIAGNOSIS — L0390 Cellulitis, unspecified: Secondary | ICD-10-CM

## 2018-10-10 LAB — CBC
HCT: 38.1 % (ref 36.0–46.0)
Hemoglobin: 11.5 g/dL — ABNORMAL LOW (ref 12.0–15.0)
MCH: 31.6 pg (ref 26.0–34.0)
MCHC: 30.2 g/dL (ref 30.0–36.0)
MCV: 104.7 fL — ABNORMAL HIGH (ref 80.0–100.0)
Platelets: 186 10*3/uL (ref 150–400)
RBC: 3.64 MIL/uL — ABNORMAL LOW (ref 3.87–5.11)
RDW: 17.6 % — ABNORMAL HIGH (ref 11.5–15.5)
WBC: 5.6 10*3/uL (ref 4.0–10.5)
nRBC: 0 % (ref 0.0–0.2)

## 2018-10-10 LAB — BASIC METABOLIC PANEL
Anion gap: 6 (ref 5–15)
BUN: 30 mg/dL — ABNORMAL HIGH (ref 8–23)
CO2: 29 mmol/L (ref 22–32)
Calcium: 8 mg/dL — ABNORMAL LOW (ref 8.9–10.3)
Chloride: 105 mmol/L (ref 98–111)
Creatinine, Ser: 1 mg/dL (ref 0.44–1.00)
GFR calc Af Amer: 59 mL/min — ABNORMAL LOW (ref >60)
GFR calc non Af Amer: 51 mL/min — ABNORMAL LOW (ref >60)
Glucose, Bld: 94 mg/dL (ref 70–99)
Potassium: 4.4 mmol/L (ref 3.5–5.1)
SODIUM: 140 mmol/L (ref 135–145)

## 2018-10-10 NOTE — Care Management (Signed)
Per [facility] rep, pt is active with [facility]. Will need resumption orders at dc. Marney Doctor RN,BSN [phone]

## 2018-10-10 NOTE — Progress Notes (Signed)
PROGRESS NOTE  Mia Contreras WPY:[id number] DOB: 1929/06/09 DOA: 10/08/2018 PCP: Cassandria Anger, MD  HPI/Recap of past 24 hours: Mia Contreras a 85 y.o.femalewith medical history significant ofCHF, A.Fib, HTN, BRCA. Recent and recurrent cellulitis and ulcers of LLEthat has been worsening despite seeing wound care with Dr. Dellia Nims for past 3 months or so.  Culture in Jan of wound grew MRSA. They attempted to treat with doxycycline and then bactrim. Unfortunatelyerythema has progressively worsened with worsening drainage of wound. Patient sent in to ED for failed outpatient treatment.  Started on vancomycin. Hospitalistasked to admit.  10/10/2018: Patient seen and examined at bedside.  Persistent redness and tenderness with minimal touch of left lower extremity.  On IV vancomycin for MRSA cellulitis, continue.  Assessment/Plan: Principal Problem:   Cellulitis due to methicillin-resistant Staphylococcus aureus (MRSA) Active Problems:   Chronic systolic heart failure (HCC)   Atrial fibrillation (HCC)  MRSA cellulitis of right lower extremity, failed outpatient treatment Continue IV vancomycin Culture of wound grew MRSA last month IV vancomycin managed by pharmacy Tolerating treatment well No leukocytosis and afebrile  Chronic systolic CHF Last 2D echo revealed LVEF 20 to 25% Continue strict I's and O's and daily weight Neck urine output since admission -560 cc  AKI, resolving Presented with creatinine of 1.29 Baseline creatinine 0.9 Continue to avoid nephrotoxic agents  Physical debility/generalized weakness PT to assess Fall precautions   DVT prophylaxis: Eliquis Code Status: Partial code, DNI Family Communication: Daughter at bedside Disposition Plan:  Home possibly with home health PT Consults called: None    Objective: Vitals:   10/09/18 1619 10/09/18 2124 10/10/18 0631 10/10/18 1408  BP: 1'02/60 93/64 95/64 '$ 94/66  Pulse: 73 60 73 98    Resp:  '20 20 17  '$ Temp:  98.4 F (36.9 C) 97.7 F (36.5 C) 97.7 F (36.5 C)  TempSrc:  Oral Oral Oral  SpO2: 90% 98% 95% 92%  Weight:      Height:        Intake/Output Summary (Last 24 hours) at 10/10/2018 1816 Last data filed at 10/10/2018 1752 Gross per 24 hour  Intake 921.22 ml  Output 2051 ml  Net -1129.78 ml   Filed Weights   10/08/18 1615  Weight: 54.5 kg    Exam:  . General: 85 y.o. year-old female well developed well nourished in no acute distress.  Alert and oriented x3. . Cardiovascular: Regular rate and rhythm with no rubs or gallops.  No thyromegaly or JVD noted.   Marland Kitchen Respiratory: Clear to auscultation with no wheezes or rales. Good inspiratory effort. . Abdomen: Soft nontender nondistended with normal bowel sounds x4 quadrants. . Musculoskeletal: No lower extremity edema. 2/4 pulses in all 4 extremities. . Skin: Left lower extremity warm erythematous and edematous with anterior wound . Psychiatry: Mood is appropriate for condition and setting   Data Reviewed: CBC: Recent Labs  Lab 10/08/18 2139 10/10/18 0451  WBC 6.1 5.6  NEUTROABS 3.7  --   HGB 12.7 11.5*  HCT 42.2 38.1  MCV 102.4* 104.7*  PLT 198 053   Basic Metabolic Panel: Recent Labs  Lab 10/08/18 2139 10/10/18 0451  NA 139 140  K 4.5 4.4  CL 101 105  CO2 28 29  GLUCOSE 93 94  BUN 32* 30*  CREATININE 1.29* 1.00  CALCIUM 8.3* 8.0*   GFR: Estimated Creatinine Clearance: 32.8 mL/min (by C-G formula based on SCr of 1 mg/dL). Liver Function Tests: No results for input(s): AST, ALT, ALKPHOS, BILITOT, PROT,  ALBUMIN in the last 168 hours. No results for input(s): LIPASE, AMYLASE in the last 168 hours. No results for input(s): AMMONIA in the last 168 hours. Coagulation Profile: No results for input(s): INR, PROTIME in the last 168 hours. Cardiac Enzymes: No results for input(s): CKTOTAL, CKMB, CKMBINDEX, TROPONINI in the last 168 hours. BNP (last 3 results) Recent Labs    06/17/18 1544  07/23/18 1305 07/31/18 1118  PROBNP 1,573.0* 8,739* 7,046*   HbA1C: No results for input(s): HGBA1C in the last 72 hours. CBG: No results for input(s): GLUCAP in the last 168 hours. Lipid Profile: No results for input(s): CHOL, HDL, LDLCALC, TRIG, CHOLHDL, LDLDIRECT in the last 72 hours. Thyroid Function Tests: No results for input(s): TSH, T4TOTAL, FREET4, T3FREE, THYROIDAB in the last 72 hours. Anemia Panel: No results for input(s): VITAMINB12, FOLATE, FERRITIN, TIBC, IRON, RETICCTPCT in the last 72 hours. Urine analysis:    Component Value Date/Time   COLORURINE YELLOW 09/16/2018 1210   APPEARANCEUR HAZY (A) 09/16/2018 1210   LABSPEC 1.010 09/16/2018 1210   PHURINE 5.0 09/16/2018 1210   GLUCOSEU NEGATIVE 09/16/2018 1210   GLUCOSEU NEGATIVE 05/06/2018 1440   HGBUR NEGATIVE 09/16/2018 1210   BILIRUBINUR NEGATIVE 09/16/2018 1210   KETONESUR NEGATIVE 09/16/2018 1210   PROTEINUR NEGATIVE 09/16/2018 1210   UROBILINOGEN 0.2 05/06/2018 1440   NITRITE NEGATIVE 09/16/2018 1210   LEUKOCYTESUR MODERATE (A) 09/16/2018 1210   Sepsis Labs: '@LABRCNTIP'$ (procalcitonin:4,lacticidven:4)  )No results found for this or any previous visit (from the past 240 hour(s)).    Studies: No results found.  Scheduled Meds: . apixaban  2.5 mg Oral BID  . carvedilol  3.125 mg Oral BID WC  . potassium chloride SA  20 mEq Oral Daily  . torsemide  40 mg Oral Daily    Continuous Infusions: . vancomycin 500 mg (10/10/18 0231)     LOS: 1 day     Kayleen Memos, MD [facility] Pager [phone]  If 7PM-7AM, please contact night-coverage [email or web address] Password [facility] 10/10/2018, 6:16 PM

## 2018-10-11 LAB — BASIC METABOLIC PANEL
Anion gap: 7 (ref 5–15)
BUN: 25 mg/dL — AB (ref 8–23)
CO2: 30 mmol/L (ref 22–32)
Calcium: 8.2 mg/dL — ABNORMAL LOW (ref 8.9–10.3)
Chloride: 102 mmol/L (ref 98–111)
Creatinine, Ser: 0.94 mg/dL (ref 0.44–1.00)
GFR calc Af Amer: >60 mL/min (ref >60)
GFR calc non Af Amer: 55 mL/min — ABNORMAL LOW (ref >60)
Glucose, Bld: 88 mg/dL (ref 70–99)
Potassium: 4.4 mmol/L (ref 3.5–5.1)
Sodium: 139 mmol/L (ref 135–145)

## 2018-10-11 NOTE — Progress Notes (Signed)
Pharmacy Antibiotic Note  Mia Contreras is a 85 y.o. female admitted on 10/08/2018 with wound infection.  Pharmacy has been consulted for vancomycin dosing.  10/11/2018 Scr 0.94 WBC 5.6  Plan: Continue Vancomycin 500 mg IV q24h for est AUC = 403.1, Cmin 11.4, Scr 1.0) Goal AUC = 400-550 F/u scr/cultures/levels  Height: 5\' 3"  (160 cm) Weight: 120 lb 3.2 oz (54.5 kg) IBW/kg (Calculated) : 52.4  Temp (24hrs), Avg:98.2 F (36.8 C), Min:97.7 F (36.5 C), Max:98.4 F (36.9 C)  Recent Labs  Lab 10/08/18 2139 10/10/18 0451 10/11/18 0734  WBC 6.1 5.6  --   CREATININE 1.29* 1.00 0.94  LATICACIDVEN 1.0  --   --     Estimated Creatinine Clearance: 34.9 mL/min (by C-G formula based on SCr of 0.94 mg/dL).    Allergies  Allergen Reactions  . Cefuroxime Diarrhea  . Celecoxib Nausea Only  . Deltasone [Prednisone] Swelling    Oral deltasone is causing swelling (can use Depo-Medrol)  . Ranitidine Other (See Comments)    bloating  . Spironolactone Other (See Comments)    High K  . Tapazole [Methimazole] Rash  . Tramadol Hcl Nausea Only  . Entresto [Sacubitril-Valsartan] Other (See Comments)    Unknown reaction per pt    Antimicrobials this admission: 2/19 vancomycin >>    >>   Dose adjustments this admission:   Microbiology results:  BCx:  UCx:    Sputum:    MRSA PCR:   Thank you for allowing pharmacy to be a part of this patient's care.  Dolly Rias RPh 10/11/2018, 12:03 PM Pager [phone]

## 2018-10-11 NOTE — Progress Notes (Signed)
Patient called out stating that her dressing had not been changed to her left lower extremity wound yet today. It looked like wound care was changing the dressing on MWF, but when wound care nurse notified, she said it was a dressing that was changed by [name] at bedside. Supplies gathered for wound care. On entry to room, patient is visibly upset and teary-eyed. States she has been dealing with this wound for 11 weeks and she is tired and doesn't want to deal with it anymore. Wants to know how to get in touch with her doctor, stating that there were specific instructions left for wound to be changed and that it had not been done yet since she had been admitted. 2 areas on left lower extremity (anterior shin and lateral/posterior calf) were cleansed with NS. Moistened aquacel was placed on both areas and left lower extremity was wrapped in kerlix. I did discuss with patient that I would get a number for her physician if she would like, or that she could talk to one of the unit managers. Regarding the later time, I offered for her to talk to the [facility] for this shift and she was agreeable. I told patient that I would put a note in the chart that her dressing was to be changed every MWF so that all shifts would know what needed to be done. Patient feeling better now and much calmer. Apologized for yelling and being upset. New dressing CDI. Will continue to monitor.

## 2018-10-11 NOTE — Progress Notes (Signed)
PROGRESS NOTE  Mia Contreras IDP:[id number] DOB: 02-08-1929 DOA: 10/08/2018 PCP: Cassandria Anger, MD  HPI/Recap of past 24 hours: Mia Contreras a 85 y.o.femalewith medical history significant ofCHF, A.Fib, HTN, BRCA. Recent and recurrent cellulitis and ulcers of LLEthat has been worsening despite seeing wound care with Dr. Dellia Nims for past 3 months or so.  Culture in Jan of wound grew MRSA. They attempted to treat with doxycycline and then bactrim. Unfortunatelyerythema has progressively worsened with worsening drainage of wound. Patient sent in to ED for failed outpatient treatment.  Started on vancomycin. Hospitalistasked to admit.  10/10/2018: Patient seen and examined at bedside.  Persistent redness and tenderness with minimal touch of left lower extremity.  On IV vancomycin for MRSA cellulitis, continue.  10/11/18: Erythema in her left lower extremity is improving.  Her pain is also improving.  Assessment/Plan: Principal Problem:   Cellulitis due to methicillin-resistant Staphylococcus aureus (MRSA) Active Problems:   Chronic systolic heart failure (HCC)   Atrial fibrillation (HCC)  MRSA cellulitis of right lower extremity, failed outpatient treatment Continue IV vancomycin day #3 Culture of wound grew MRSA last month IV vancomycin managed by pharmacy Tolerating treatment well No leukocytosis and afebrile  Chronic systolic CHF Last 2D echo revealed LVEF 20 to 25% Continue strict I's and O's and daily weight Good urine output net negative 2.8 L since admission  AKI, resolving Presented with creatinine of 1.29 Baseline creatinine 0.9 Continue to avoid nephrotoxic agents  Physical debility/generalized weakness PT to assess Fall precautions   DVT prophylaxis: Eliquis Code Status: Partial code, DNI Family Communication: Daughter at bedside Disposition Plan:  Home possibly with home health PT Consults called: None    Objective: Vitals:   10/10/18 1408 10/10/18 2037 10/11/18 0649 10/11/18 1345  BP: 94/66 101/62 105/70 126/80  Pulse: 98 (!) 52 78 (!) 108  Resp: '17 18 16 17  '$ Temp: 97.7 F (36.5 C) 98.4 F (36.9 C) 98.4 F (36.9 C) (!) 97.5 F (36.4 C)  TempSrc: Oral Oral Oral Oral  SpO2: 92% 96% 99% 96%  Weight:      Height:        Intake/Output Summary (Last 24 hours) at 10/11/2018 1559 Last data filed at 10/11/2018 1503 Gross per 24 hour  Intake 435.86 ml  Output 1650 ml  Net -1214.14 ml   Filed Weights   10/08/18 1615  Weight: 54.5 kg    Exam:  . General: 85 y.o. year-old female well-developed well-nourished no acute distress.  Alert oriented x3. . Cardiovascular: Regular rate and rhythm with no rubs or gallops.  No JVD or thyromegaly. Marland Kitchen Respiratory: Clear to auscultation with no wheezes or rales.  Good inspiratory effort. . Abdomen: Soft nontender nondistended with normal bowel sounds x4 quadrants. . Musculoskeletal: No lower extremity edema. 2/4 pulses in all 4 extremities. . Skin: Left lower extremity warm erythematous and edematous with anterior wound . Psychiatry: Mood is appropriate for condition and setting   Data Reviewed: CBC: Recent Labs  Lab 10/08/18 2139 10/10/18 0451  WBC 6.1 5.6  NEUTROABS 3.7  --   HGB 12.7 11.5*  HCT 42.2 38.1  MCV 102.4* 104.7*  PLT 198 443   Basic Metabolic Panel: Recent Labs  Lab 10/08/18 2139 10/10/18 0451 10/11/18 0734  NA 139 140 139  K 4.5 4.4 4.4  CL 101 105 102  CO2 '28 29 30  '$ GLUCOSE 93 94 88  BUN 32* 30* 25*  CREATININE 1.29* 1.00 0.94  CALCIUM 8.3* 8.0* 8.2*  GFR: Estimated Creatinine Clearance: 34.9 mL/min (by C-G formula based on SCr of 0.94 mg/dL). Liver Function Tests: No results for input(s): AST, ALT, ALKPHOS, BILITOT, PROT, ALBUMIN in the last 168 hours. No results for input(s): LIPASE, AMYLASE in the last 168 hours. No results for input(s): AMMONIA in the last 168 hours. Coagulation Profile: No results for input(s): INR,  PROTIME in the last 168 hours. Cardiac Enzymes: No results for input(s): CKTOTAL, CKMB, CKMBINDEX, TROPONINI in the last 168 hours. BNP (last 3 results) Recent Labs    06/17/18 1544 07/23/18 1305 07/31/18 1118  PROBNP 1,573.0* 8,739* 7,046*   HbA1C: No results for input(s): HGBA1C in the last 72 hours. CBG: No results for input(s): GLUCAP in the last 168 hours. Lipid Profile: No results for input(s): CHOL, HDL, LDLCALC, TRIG, CHOLHDL, LDLDIRECT in the last 72 hours. Thyroid Function Tests: No results for input(s): TSH, T4TOTAL, FREET4, T3FREE, THYROIDAB in the last 72 hours. Anemia Panel: No results for input(s): VITAMINB12, FOLATE, FERRITIN, TIBC, IRON, RETICCTPCT in the last 72 hours. Urine analysis:    Component Value Date/Time   COLORURINE YELLOW 09/16/2018 1210   APPEARANCEUR HAZY (A) 09/16/2018 1210   LABSPEC 1.010 09/16/2018 1210   PHURINE 5.0 09/16/2018 1210   GLUCOSEU NEGATIVE 09/16/2018 1210   GLUCOSEU NEGATIVE 05/06/2018 1440   HGBUR NEGATIVE 09/16/2018 1210   BILIRUBINUR NEGATIVE 09/16/2018 1210   KETONESUR NEGATIVE 09/16/2018 1210   PROTEINUR NEGATIVE 09/16/2018 1210   UROBILINOGEN 0.2 05/06/2018 1440   NITRITE NEGATIVE 09/16/2018 1210   LEUKOCYTESUR MODERATE (A) 09/16/2018 1210   Sepsis Labs: '@LABRCNTIP'$ (procalcitonin:4,lacticidven:4)  )No results found for this or any previous visit (from the past 240 hour(s)).    Studies: No results found.  Scheduled Meds: . apixaban  2.5 mg Oral BID  . carvedilol  3.125 mg Oral BID WC  . potassium chloride SA  20 mEq Oral Daily  . torsemide  40 mg Oral Daily    Continuous Infusions: . vancomycin Stopped (10/11/18 0908)     LOS: 2 days     Kayleen Memos, MD [facility] Pager [phone]  If 7PM-7AM, please contact night-coverage [email or web address] Password [facility] 10/11/2018, 3:59 PM

## 2018-10-11 NOTE — Evaluation (Signed)
Physical Therapy Evaluation Patient Details Name: Mia Contreras MRN: [id number] DOB: [birth year] Today's Date: 10/11/2018   History of Present Illness  85 y.o. female with medical history significant of CHF, A.Fib, HTN, BRCA, CKD stage IV.  Pt with Recent and recurrent cellulitis and ulcers of LLE that has been worsening despite seeing wound care and admitted for MRSA cellulitis of right lower extremity (failed outpatient treatment)  Clinical Impression  Pt admitted with above diagnosis. Pt currently with functional limitations due to the deficits listed below (see PT Problem List).  Pt will benefit from skilled PT to increase their independence and safety with mobility to allow discharge to the venue listed below.  Pt reports frustrations with her previous wound care however agreeable to mobilize after expressing her frustrations and sharing life stories (making a longer session).  Pt states she is very independent and uses RW for mobility at baseline.  Pt states [facility] assists with wound care and she is considering HHPT through them however also states she may just try her own seated exercises on her own.  Pt requiring supplemental oxygen at this time.  SPO2 dropped to 80% on room air with ambulation so 2L O2 [place] reapplied once pt returned to bed and SpO2 improved to 90% prior to leaving room.       Follow Up Recommendations Home health PT;Supervision for mobility/OOB    Equipment Recommendations  None recommended by PT    Recommendations for Other Services       Precautions / Restrictions Precautions Precautions: Fall Precaution Comments: monitor sats      Mobility  Bed Mobility Overal bed mobility: Needs Assistance Bed Mobility: Supine to Sit;Sit to Supine     Supine to sit: Supervision Sit to supine: Supervision      Transfers Overall transfer level: Needs assistance Equipment used: Rolling walker (2 wheeled) Transfers: Sit to/from Stand Sit to Stand:  Supervision            Ambulation/Gait Ambulation/Gait assistance: Min guard Gait Distance (Feet): 60 Feet Assistive device: Rolling walker (2 wheeled) Gait Pattern/deviations: Step-through pattern;Wide base of support     General Gait Details: stiff lower legs with short steps and wide BOS, one instance of LOB however pt self corrected with UEs through RW, SPO2 dropped to 80% on room air so 2L O2 [place] reapplied once back in room  Stairs            Wheelchair Mobility    Modified Rankin (Stroke Patients Only)       Balance Overall balance assessment: Mild deficits observed, not formally tested(pt denies any recent falls)                                           Pertinent Vitals/Pain Pain Assessment: Faces Faces Pain Scale: Hurts little more Pain Location: left lower leg Pain Descriptors / Indicators: Aching;Tender Pain Intervention(s): Monitored during session;Repositioned    Home Living Family/patient expects to be discharged to:: Private residence Living Arrangements: Alone Available Help at Discharge: Friend(s);Neighbor;Available PRN/intermittently Type of Home: House(townhome)       Home Layout: One level Home Equipment: Grab bars - tub/shower;Walker - 2 wheels;Walker - 4 wheels      Prior Function Level of Independence: Independent with assistive device(s)         Comments: pt reports she is mod I with RW at home, neighbor/friend  drives her to appts     Hand Dominance        Extremity/Trunk Assessment        Lower Extremity Assessment Lower Extremity Assessment: LLE deficits/detail LLE Deficits / Details: pt with dressing over lower leg, pt reports "bunion" on foot causing pain as well; hx of bil TKAs       Communication   Communication: No difficulties  Cognition Arousal/Alertness: Awake/alert Behavior During Therapy: WFL for tasks assessed/performed Overall Cognitive Status: Within Functional Limits for tasks  assessed                                        General Comments      Exercises     Assessment/Plan    PT Assessment Patient needs continued PT services  PT Problem List Decreased mobility;Decreased balance;Decreased skin integrity;Decreased activity tolerance;Cardiopulmonary status limiting activity       PT Treatment Interventions DME instruction;Balance training;Functional mobility training;Gait training;Therapeutic activities;Stair training;Patient/family education;Therapeutic exercise    PT Goals (Current goals can be found in the Care Plan section)  Acute Rehab PT Goals PT Goal Formulation: With patient Time For Goal Achievement: 10/25/18 Potential to Achieve Goals: Good    Frequency Min 3X/week   Barriers to discharge        Co-evaluation               AM-PAC PT "6 Clicks" Mobility  Outcome Measure Help needed turning from your back to your side while in a flat bed without using bedrails?: None Help needed moving from lying on your back to sitting on the side of a flat bed without using bedrails?: A Little Help needed moving to and from a bed to a chair (including a wheelchair)?: A Little Help needed standing up from a chair using your arms (e.g., wheelchair or bedside chair)?: A Little Help needed to walk in hospital room?: A Little Help needed climbing 3-5 steps with a railing? : A Little 6 Click Score: 19    End of Session Equipment Utilized During Treatment: Gait belt;Oxygen Activity Tolerance: Patient tolerated treatment well Patient left: in bed;with call bell/phone within reach;with bed alarm set   PT Visit Diagnosis: Other abnormalities of gait and mobility (R26.89)    Time: 8938-1017 PT Time Calculation (min) (ACUTE ONLY): 51 min   Charges:   PT Evaluation $PT Eval Low Complexity: [place], PT, DPT Acute Rehabilitation Services Office: [phone] Pager: [phone]  Trena Platt 10/11/2018, 4:19 PM

## 2018-10-11 NOTE — Plan of Care (Signed)
Patient lying in bed this morning; has been up to bathroom multiple times. No complaints of pain currently. Will continue to monitor.

## 2018-10-11 NOTE — Care Management Important Message (Signed)
Important Message  Patient Details  Name: OVETA IDRIS MRN: [id number] Date of Birth: [birth year]   Medicare Important Message Given:  Yes    Kerin Salen 10/11/2018, 11:45 AMImportant Message  Patient Details  Name: CEARRA PORTNOY MRN: [id number] Date of Birth: [birth year]   Medicare Important Message Given:  Yes    Kerin Salen 10/11/2018, 11:45 AM

## 2018-10-12 MED ORDER — BENZONATATE 100 MG PO CAPS
200.0000 mg | ORAL_CAPSULE | Freq: Three times a day (TID) | ORAL | Status: AC
  Administered 2018-10-12 – 2018-10-13 (×3): 200 mg via ORAL
  Filled 2018-10-12 (×3): qty 2

## 2018-10-12 MED ORDER — GUAIFENESIN-DM 100-10 MG/5ML PO SYRP
5.0000 mL | ORAL_SOLUTION | ORAL | Status: DC | PRN
  Administered 2018-10-12 – 2018-10-13 (×2): 5 mL via ORAL
  Filled 2018-10-12 (×2): qty 10

## 2018-10-12 NOTE — Plan of Care (Signed)
Patient lying in bed this morning. Resting well; no complaints of pain at this time. Will continue to monitor.

## 2018-10-12 NOTE — Progress Notes (Signed)
PROGRESS NOTE  Mia Contreras OPF:[id number] DOB: 06/26/1929 DOA: 10/08/2018 PCP: Cassandria Anger, MD  HPI/Recap of past 24 hours: Mia Contreras a 85 y.o.femalewith medical history significant ofCHF, A.Fib, HTN, BRCA. Recent and recurrent cellulitis and ulcers of LLEthat has been worsening despite seeing wound care with Dr. Dellia Nims for past 3 months or so.  Culture in Jan of wound grew MRSA. They attempted to treat with doxycycline and then bactrim. Unfortunatelyerythema has progressively worsened with worsening drainage of wound. Patient sent in to ED for failed outpatient treatment.  Started on IV vancomycin. Hospitalistasked to admit.  10/12/18: Tolerating treatment well.  Still some warmth, erythema, and tenderness with minimal touch on exam.  Assessment/Plan: Principal Problem:   Cellulitis due to methicillin-resistant Staphylococcus aureus (MRSA) Active Problems:   Chronic systolic heart failure (HCC)   Atrial fibrillation (HCC)  MRSA cellulitis of right lower extremity, failed outpatient treatment Continue IV vancomycin day #4 Culture of wound grew MRSA last month IV vancomycin managed by pharmacy Tolerating treatment well No leukocytosis and afebrile Continue dressing changes as recommended by wound care specialist  Chronic systolic CHF Last 2D echo revealed LVEF 20 to 25% Continue strict I's and O's and daily weight Good urine output net negative 2.8 L since admission  AKI, resolving Presented with creatinine of 1.29 Baseline creatinine 0.9 Continue to avoid nephrotoxic agents  Physical debility/generalized weakness PT recommends home health PT Fall precautions   DVT prophylaxis: Eliquis Code Status: Partial code, DNI Family Communication: Daughter at bedside Disposition Plan:  Home possibly with home health PT Consults called: None    Objective: Vitals:   10/11/18 2159 10/11/18 2258 10/12/18 0446 10/12/18 1427  BP: 108/60 105/60  101/82 120/75  Pulse: (!) 111 86 89 79  Resp: '15  15 18  '$ Temp: 98.9 F (37.2 C)  98.9 F (37.2 C) 98.9 F (37.2 C)  TempSrc: Oral  Oral Oral  SpO2: 97% 96% 93% 95%  Weight:      Height:        Intake/Output Summary (Last 24 hours) at 10/12/2018 1755 Last data filed at 10/12/2018 1422 Gross per 24 hour  Intake 509.98 ml  Output 1575 ml  Net -1065.02 ml   Filed Weights   10/08/18 1615  Weight: 54.5 kg    Exam:  . General: 85 y.o. year-old female well-nourished in no acute distress.  Alert and oriented x3 . Cardiovascular: Regular rate and rhythm with no rubs or gallops.  No JVD or thyromegaly . Respiratory: Clear to auscultation with no wheezes or rales.  Good inspiratory effort. . Abdomen: Soft nontender nondistended with normal bowel sounds x4 quadrants. . Musculoskeletal: 1+ pitting edema in lower extremities bilaterally a little worse on the left lower extremity. . Skin: Left lower extremity warm erythematous and edematous with wounds . Psychiatry: Mood is appropriate for condition and setting   Data Reviewed: CBC: Recent Labs  Lab 10/08/18 2139 10/10/18 0451  WBC 6.1 5.6  NEUTROABS 3.7  --   HGB 12.7 11.5*  HCT 42.2 38.1  MCV 102.4* 104.7*  PLT 198 381   Basic Metabolic Panel: Recent Labs  Lab 10/08/18 2139 10/10/18 0451 10/11/18 0734  NA 139 140 139  K 4.5 4.4 4.4  CL 101 105 102  CO2 '28 29 30  '$ GLUCOSE 93 94 88  BUN 32* 30* 25*  CREATININE 1.29* 1.00 0.94  CALCIUM 8.3* 8.0* 8.2*   GFR: Estimated Creatinine Clearance: 34.9 mL/min (by C-G formula based on SCr of  0.94 mg/dL). Liver Function Tests: No results for input(s): AST, ALT, ALKPHOS, BILITOT, PROT, ALBUMIN in the last 168 hours. No results for input(s): LIPASE, AMYLASE in the last 168 hours. No results for input(s): AMMONIA in the last 168 hours. Coagulation Profile: No results for input(s): INR, PROTIME in the last 168 hours. Cardiac Enzymes: No results for input(s): CKTOTAL, CKMB,  CKMBINDEX, TROPONINI in the last 168 hours. BNP (last 3 results) Recent Labs    06/17/18 1544 07/23/18 1305 07/31/18 1118  PROBNP 1,573.0* 8,739* 7,046*   HbA1C: No results for input(s): HGBA1C in the last 72 hours. CBG: No results for input(s): GLUCAP in the last 168 hours. Lipid Profile: No results for input(s): CHOL, HDL, LDLCALC, TRIG, CHOLHDL, LDLDIRECT in the last 72 hours. Thyroid Function Tests: No results for input(s): TSH, T4TOTAL, FREET4, T3FREE, THYROIDAB in the last 72 hours. Anemia Panel: No results for input(s): VITAMINB12, FOLATE, FERRITIN, TIBC, IRON, RETICCTPCT in the last 72 hours. Urine analysis:    Component Value Date/Time   COLORURINE YELLOW 09/16/2018 1210   APPEARANCEUR HAZY (A) 09/16/2018 1210   LABSPEC 1.010 09/16/2018 1210   PHURINE 5.0 09/16/2018 1210   GLUCOSEU NEGATIVE 09/16/2018 1210   GLUCOSEU NEGATIVE 05/06/2018 1440   HGBUR NEGATIVE 09/16/2018 1210   BILIRUBINUR NEGATIVE 09/16/2018 1210   KETONESUR NEGATIVE 09/16/2018 1210   PROTEINUR NEGATIVE 09/16/2018 1210   UROBILINOGEN 0.2 05/06/2018 1440   NITRITE NEGATIVE 09/16/2018 1210   LEUKOCYTESUR MODERATE (A) 09/16/2018 1210   Sepsis Labs: '@LABRCNTIP'$ (procalcitonin:4,lacticidven:4)  )No results found for this or any previous visit (from the past 240 hour(s)).    Studies: No results found.  Scheduled Meds: . apixaban  2.5 mg Oral BID  . benzonatate  200 mg Oral TID  . carvedilol  3.125 mg Oral BID WC  . potassium chloride SA  20 mEq Oral Daily  . torsemide  40 mg Oral Daily    Continuous Infusions: . vancomycin 10 mL/hr at 10/12/18 1000     LOS: 3 days     Kayleen Memos, MD [facility] Pager [phone]  If 7PM-7AM, please contact night-coverage [email or web address] Password [facility] 10/12/2018, 5:55 PM

## 2018-10-12 NOTE — Progress Notes (Signed)
Bandage on dressing to left lower extremity slid down. Removed old dressing; cleansed area with NS. Aquacel applied and covered with gauze and kerlix.

## 2018-10-13 LAB — CBC
HCT: 42.2 % (ref 36.0–46.0)
Hemoglobin: 12.3 g/dL (ref 12.0–15.0)
MCH: 30.7 pg (ref 26.0–34.0)
MCHC: 29.1 g/dL — ABNORMAL LOW (ref 30.0–36.0)
MCV: 105.2 fL — ABNORMAL HIGH (ref 80.0–100.0)
PLATELETS: 231 10*3/uL (ref 150–400)
RBC: 4.01 MIL/uL (ref 3.87–5.11)
RDW: 17.6 % — ABNORMAL HIGH (ref 11.5–15.5)
WBC: 6.9 10*3/uL (ref 4.0–10.5)
nRBC: 0 % (ref 0.0–0.2)

## 2018-10-13 LAB — VANCOMYCIN, PEAK: Vancomycin Pk: 19 ug/mL — ABNORMAL LOW (ref 30–40)

## 2018-10-13 LAB — CREATININE, SERUM
Creatinine, Ser: 1.1 mg/dL — ABNORMAL HIGH (ref 0.44–1.00)
GFR calc Af Amer: 52 mL/min — ABNORMAL LOW (ref >60)
GFR calc non Af Amer: 45 mL/min — ABNORMAL LOW (ref >60)

## 2018-10-13 LAB — VANCOMYCIN, TROUGH: Vancomycin Tr: 9 ug/mL — ABNORMAL LOW (ref 15–20)

## 2018-10-13 MED ORDER — VANCOMYCIN HCL IN DEXTROSE 750-5 MG/150ML-% IV SOLN
750.0000 mg | Freq: Every day | INTRAVENOUS | Status: DC
  Administered 2018-10-14: 750 mg via INTRAVENOUS
  Filled 2018-10-13: qty 150

## 2018-10-13 NOTE — Progress Notes (Signed)
Pharmacy Antibiotic Note  Mia Contreras is a 85 y.o. female admitted on 10/08/2018 with wound infection with MRSA in 09/10/18 wound culture.  Pharmacy has been consulted for vancomycin dosing.  10/13/2018  Day 5 Vanc 500mg  IV q24  Afebrile  WBC stable WNL  SCr rose slightly from 0.94 to 1.1 - CrCl 30  Vanc Trough 9, Vanc Peak 19 - calculated AUC of 332 (goal 400-550)   Plan:  Change vanc from 500mg  IV q24 to 750mg  IV q24 - newly estimated AUC of 497, Pk 30 and Tr 13  Height: 5\' 3"  (160 cm) Weight: 120 lb 3.2 oz (54.5 kg) IBW/kg (Calculated) : 52.4  Temp (24hrs), Avg:98.2 F (36.8 C), Min:97.4 F (36.3 C), Max:98.9 F (37.2 C)  Recent Labs  Lab 10/08/18 2139 10/10/18 0451 10/11/18 0734 10/13/18 0452 10/13/18 0758  WBC 6.1 5.6  --  6.9  --   CREATININE 1.29* 1.00 0.94 1.10*  --   LATICACIDVEN 1.0  --   --   --   --   VANCOTROUGH  --   --   --  9*  --   VANCOPEAK  --   --   --   --  19*    Estimated Creatinine Clearance: 29.8 mL/min (A) (by C-G formula based on SCr of 1.1 mg/dL (H)).    Allergies  Allergen Reactions  . Cefuroxime Diarrhea  . Celecoxib Nausea Only  . Deltasone [Prednisone] Swelling    Oral deltasone is causing swelling (can use Depo-Medrol)  . Ranitidine Other (See Comments)    bloating  . Spironolactone Other (See Comments)    High K  . Tapazole [Methimazole] Rash  . Tramadol Hcl Nausea Only  . Entresto [Sacubitril-Valsartan] Other (See Comments)    Unknown reaction per pt     Thank you for allowing pharmacy to be a part of this patient's care.  Adrian Saran, PharmD, BCPS 10/13/2018 9:21 AM

## 2018-10-13 NOTE — Progress Notes (Addendum)
PROGRESS NOTE  Mia Contreras LEX:[id number] DOB: [birth year] DOA: 10/08/2018 PCP: Cassandria Anger, MD  HPI/Recap of past 24 hours: Mia Contreras a 85 y.o.femalewith medical history significant ofCHF, A.Fib, HTN, BRCA. Recent and recurrent cellulitis and ulcers of LLEthat has been worsening despite seeing wound care with Dr. Dellia Nims for past 3 months or so.  Culture in Jan of wound grew MRSA. They attempted to treat with doxycycline and then bactrim. Unfortunatelyerythema has progressively worsened with worsening drainage of wound. Patient sent in to ED for failed outpatient treatment.  Started on IV vancomycin. Hospitalistasked to admit.  10/13/2018: Patient seen and examined at bedside.  No acute events overnight.  No new complaints.  Tolerating her treatment well.  Possible discharge tomorrow.  Will follow-up with general surgery outpatient.    Assessment/Plan: Principal Problem:   Cellulitis due to methicillin-resistant Staphylococcus aureus (MRSA) Active Problems:   Chronic systolic heart failure (HCC)   Atrial fibrillation (HCC)  MRSA cellulitis of right lower extremity, failed outpatient treatment Continue IV vancomycin day #5 Culture of wound grew MRSA last month IV vancomycin managed by pharmacy Tolerating treatment well No leukocytosis and afebrile Continue dressing changes as recommended by wound care specialist Follow-up with general surgery outpatient  Chronic systolic CHF Last 2D echo revealed LVEF 20 to 25% Continue strict I's and O's and daily weight Good urine output net -4.4 L since admission  AKI Presented with creatinine of 1.29 Baseline creatinine 1.10 from 0.9 Continue to avoid nephrotoxic agents Good urine output Continue to monitor Obtain BMP in the morning  Physical debility/generalized weakness PT recommends home health PT Fall precautions   DVT prophylaxis: Eliquis Code Status: Partial code, DNI Family Communication:  Daughter at bedside Disposition Plan:  Home possibly Monday, 10/14/2018 with home health PT Consults called: None    Objective: Vitals:   10/12/18 0446 10/12/18 1427 10/12/18 2037 10/13/18 0448  BP: 101/82 120/75 98/74 116/78  Pulse: 89 79 86 85  Resp: _0 Temp: 98.9 F (37.2 C) 98.9 F (37.2 C) (!) 97.4 F (36.3 C) 98.3 F (36.8 C)  TempSrc: Oral Oral Oral Oral  SpO2: 93% 95% 90% 90%  Weight:      Height:        Intake/Output Summary (Last 24 hours) at 10/13/2018 1301 Last data filed at 10/13/2018 0437 Gross per 24 hour  Intake 265.43 ml  Output 1200 ml  Net -934.57 ml   Filed Weights   10/08/18 1615  Weight: 54.5 kg    Exam:  . General: 85 y.o. year-old female well-nourished well developed in no acute distress.  Alert and oriented x3. . Cardiovascular: Regular rate and rhythm with no rubs or gallops.  No JVD or thyromegaly. Marland Kitchen Respiratory: Clear to auscultation with no wheezes or rales.  Good inspiratory effort.  . Abdomen: Soft nontender nondistended with normal bowel sounds x4 quadrants. . Musculoskeletal: 1+ pitting edema in lower extremities bilaterally a little worse on the left lower extremity. Marland Kitchen Psychiatry: Mood is appropriate for condition and setting   Data Reviewed: CBC: Recent Labs  Lab 10/08/18 2139 10/10/18 0451 10/13/18 0452  WBC 6.1 5.6 6.9  NEUTROABS 3.7  --   --   HGB 12.7 11.5* 12.3  HCT 42.2 38.1 42.2  MCV 102.4* 104.7* 105.2*  PLT 198 186 449   Basic Metabolic Panel: Recent Labs  Lab 10/08/18 2139 10/10/18 0451 10/11/18 0734 10/13/18 0452  NA 139 140 139  --   K 4.5 4.4  4.4  --   CL 101 105 102  --   CO2 _0 --   GLUCOSE 93 94 88  --   BUN 32* 30* 25*  --   CREATININE 1.29* 1.00 0.94 1.10*  CALCIUM 8.3* 8.0* 8.2*  --    GFR: Estimated Creatinine Clearance: 29.8 mL/min (A) (by C-G formula based on SCr of 1.1 mg/dL (H)). Liver Function Tests: No results for input(s): AST, ALT, ALKPHOS, BILITOT, PROT, ALBUMIN  in the last 168 hours. No results for input(s): LIPASE, AMYLASE in the last 168 hours. No results for input(s): AMMONIA in the last 168 hours. Coagulation Profile: No results for input(s): INR, PROTIME in the last 168 hours. Cardiac Enzymes: No results for input(s): CKTOTAL, CKMB, CKMBINDEX, TROPONINI in the last 168 hours. BNP (last 3 results) Recent Labs    06/17/18 1544 07/23/18 1305 07/31/18 1118  PROBNP 1,573.0* 8,739* 7,046*   HbA1C: No results for input(s): HGBA1C in the last 72 hours. CBG: No results for input(s): GLUCAP in the last 168 hours. Lipid Profile: No results for input(s): CHOL, HDL, LDLCALC, TRIG, CHOLHDL, LDLDIRECT in the last 72 hours. Thyroid Function Tests: No results for input(s): TSH, T4TOTAL, FREET4, T3FREE, THYROIDAB in the last 72 hours. Anemia Panel: No results for input(s): VITAMINB12, FOLATE, FERRITIN, TIBC, IRON, RETICCTPCT in the last 72 hours. Urine analysis:    Component Value Date/Time   COLORURINE YELLOW 09/16/2018 1210   APPEARANCEUR HAZY (A) 09/16/2018 1210   LABSPEC 1.010 09/16/2018 1210   PHURINE 5.0 09/16/2018 1210   GLUCOSEU NEGATIVE 09/16/2018 1210   GLUCOSEU NEGATIVE 05/06/2018 1440   HGBUR NEGATIVE 09/16/2018 1210   BILIRUBINUR NEGATIVE 09/16/2018 1210   KETONESUR NEGATIVE 09/16/2018 1210   PROTEINUR NEGATIVE 09/16/2018 1210   UROBILINOGEN 0.2 05/06/2018 1440   NITRITE NEGATIVE 09/16/2018 1210   LEUKOCYTESUR MODERATE (A) 09/16/2018 1210   Sepsis Labs: _1 (procalcitonin:4,lacticidven:4)  )No results found for this or any previous visit (from the past 240 hour(s)).    Studies: No results found.  Scheduled Meds: . apixaban  2.5 mg Oral BID  . carvedilol  3.125 mg Oral BID WC  . potassium chloride SA  20 mEq Oral Daily  . torsemide  40 mg Oral Daily    Continuous Infusions: . [START ON 10/14/2018] vancomycin       LOS: 4 days     Kayleen Memos, MD [facility] Pager [phone]  If 7PM-7AM,  please contact night-coverage [email or web address] Password [email or web address] 10/13/2018, 1:01 PM

## 2018-10-13 NOTE — Plan of Care (Signed)
Patient lying in bed this morning. No complaints of pain currently. Dressing remains in place to left lower extremity. Edema to bilateral lower extremities. Will continue to monitor.

## 2018-10-14 ENCOUNTER — Inpatient Hospital Stay (HOSPITAL_COMMUNITY): Payer: Medicare Other

## 2018-10-14 LAB — BASIC METABOLIC PANEL
ANION GAP: 5 (ref 5–15)
BUN: 24 mg/dL — ABNORMAL HIGH (ref 8–23)
CO2: 33 mmol/L — ABNORMAL HIGH (ref 22–32)
Calcium: 7.9 mg/dL — ABNORMAL LOW (ref 8.9–10.3)
Chloride: 101 mmol/L (ref 98–111)
Creatinine, Ser: 0.99 mg/dL (ref 0.44–1.00)
GFR calc non Af Amer: 51 mL/min — ABNORMAL LOW (ref >60)
GFR, EST AFRICAN AMERICAN: 59 mL/min — AB (ref >60)
Glucose, Bld: 95 mg/dL (ref 70–99)
Potassium: 4.3 mmol/L (ref 3.5–5.1)
Sodium: 139 mmol/L (ref 135–145)

## 2018-10-14 LAB — CBC
HCT: 37.6 % (ref 36.0–46.0)
Hemoglobin: 11.4 g/dL — ABNORMAL LOW (ref 12.0–15.0)
MCH: 31.5 pg (ref 26.0–34.0)
MCHC: 30.3 g/dL (ref 30.0–36.0)
MCV: 103.9 fL — ABNORMAL HIGH (ref 80.0–100.0)
Platelets: 201 10*3/uL (ref 150–400)
RBC: 3.62 MIL/uL — AB (ref 3.87–5.11)
RDW: 17.7 % — ABNORMAL HIGH (ref 11.5–15.5)
WBC: 6.4 10*3/uL (ref 4.0–10.5)
nRBC: 0 % (ref 0.0–0.2)

## 2018-10-14 LAB — MAGNESIUM: Magnesium: 2.2 mg/dL (ref 1.7–2.4)

## 2018-10-14 MED ORDER — DOXYCYCLINE HYCLATE 100 MG PO TABS
100.0000 mg | ORAL_TABLET | Freq: Two times a day (BID) | ORAL | Status: DC
  Administered 2018-10-14 – 2018-10-15 (×2): 100 mg via ORAL
  Filled 2018-10-14 (×2): qty 1

## 2018-10-14 MED ORDER — DOXYCYCLINE HYCLATE 100 MG PO TABS: 100.0000 mg | ORAL_TABLET | Freq: Two times a day (BID) | ORAL | 0 refills | Status: AC

## 2018-10-14 NOTE — Progress Notes (Signed)
Called wound center and let them know pt will not be able to make appointment for tomorrow since still in the hospital.  Talked with [name] who said she would take care of the appointment but for Mia Contreras to call when ready and able for follow up.

## 2018-10-14 NOTE — Progress Notes (Signed)
Physical Therapy Treatment Patient Details Name: Mia Contreras MRN: [id number] DOB: 23-Jan-1929 Today's Date: 10/14/2018    History of Present Illness 85 y.o. female with medical history significant of CHF, A.Fib, HTN, BRCA, CKD stage IV.  Pt with Recent and recurrent cellulitis and ulcers of LLE that has been worsening despite seeing wound care and admitted for MRSA cellulitis of right lower extremity (failed outpatient treatment)    PT Comments    Patient seen for mobility progression. Patient very emotional and tearful throughout session, but remained motivated to participate. Patient performing transfers and mobility with RW with Min guard assist for safety - some verbal cueing for safety. Of note SpO2 on RA continues to drop into the low 80s with mobility - may need to consider home oxygen. Will continue to follow.     Follow Up Recommendations  Home health PT;Supervision for mobility/OOB     Equipment Recommendations  None recommended by PT    Recommendations for Other Services       Precautions / Restrictions Precautions Precautions: Fall Precaution Comments: monitor sats Restrictions Weight Bearing Restrictions: No    Mobility  Bed Mobility Overal bed mobility: Needs Assistance Bed Mobility: Supine to Sit     Supine to sit: Supervision     General bed mobility comments: increased time and effort  Transfers Overall transfer level: Needs assistance Equipment used: Rolling walker (2 wheeled) Transfers: Sit to/from Stand Sit to Stand: Min guard         General transfer comment: min guard with patient pushing up from bedside, toilet and recliner  Ambulation/Gait Ambulation/Gait assistance: Min guard Gait Distance (Feet): 75 Feet Assistive device: Rolling walker (2 wheeled) Gait Pattern/deviations: Step-through pattern;Shuffle;Wide base of support Gait velocity: decreased   General Gait Details: shuffle steps - patient reports this at baseline; SpO2  dropping to 83% on RA, with 1L able to improve to 93%   Stairs             Wheelchair Mobility    Modified Rankin (Stroke Patients Only)       Balance Overall balance assessment: Mild deficits observed, not formally tested                                          Cognition Arousal/Alertness: Awake/alert Behavior During Therapy: WFL for tasks assessed/performed Overall Cognitive Status: Within Functional Limits for tasks assessed                                        Exercises      General Comments General comments (skin integrity, edema, etc.): friend present; patient very emotional      Pertinent Vitals/Pain Pain Assessment: No/denies pain    Home Living                      Prior Function            PT Goals (current goals can now be found in the care plan section) Acute Rehab PT Goals Patient Stated Goal: "I don't want to deal with this anymore" - regarding her wounds PT Goal Formulation: With patient Time For Goal Achievement: 10/25/18 Potential to Achieve Goals: Good Progress towards PT goals: Progressing toward goals    Frequency    Min 3X/week  PT Plan Current plan remains appropriate    Co-evaluation              AM-PAC PT "6 Clicks" Mobility   Outcome Measure  Help needed turning from your back to your side while in a flat bed without using bedrails?: A Little Help needed moving from lying on your back to sitting on the side of a flat bed without using bedrails?: A Little Help needed moving to and from a bed to a chair (including a wheelchair)?: A Little Help needed standing up from a chair using your arms (e.g., wheelchair or bedside chair)?: A Little Help needed to walk in hospital room?: A Little Help needed climbing 3-5 steps with a railing? : A Little 6 Click Score: 18    End of Session Equipment Utilized During Treatment: Gait belt;Oxygen Activity Tolerance: Patient  tolerated treatment well Patient left: in chair;with call bell/phone within reach;with family/visitor present Nurse Communication: Mobility status PT Visit Diagnosis: Other abnormalities of gait and mobility (R26.89)     Time: 5643-3295 PT Time Calculation (min) (ACUTE ONLY): 34 min  Charges:  $Gait Training: 8-22 mins $Therapeutic Activity: 8-22 mins                      Lanney Gins, PT, DPT Supplemental Physical Therapist 10/14/18 1:26 PM Pager: [phone] Office: [phone]

## 2018-10-14 NOTE — Care Management Note (Signed)
Case Management Note  Patient Details  Name: Mia Contreras MRN: [id number] Date of Birth: [birth year]  Subjective/Objective:                  Discharged to home   Action/Plan: dme [facility] RN,P.T. AND AIDE -[facility] Expected Discharge Date:                  Expected Discharge Plan:  [place]  In-House Referral:     Discharge planning Services  CM Consult  Post Acute Care Choice:  Durable Medical Equipment, Home Health Choice offered to:  Patient  DME Arranged:  Oxygen DME Agency:  [place] Arranged:  RN, Nurse's Aide, PT [facility] Agency:  [place]  Status of Service:  Completed, signed off  If discussed at [place] of Stay Meetings, dates discussed:    Additional Comments:  Leeroy Cha, RN 10/14/2018, 2:26 PM

## 2018-10-14 NOTE — Progress Notes (Signed)
   10/14/18 1857  Oxygen Therapy  SpO2 93 %  O2 Device Nasal Cannula  O2 Flow Rate (L/min) 2 L/min  Patient Activity (if Appropriate) Ambulating

## 2018-10-14 NOTE — Consult Note (Signed)
[place] Nurse wound consult note Reason for Consult:Re-evaluate leg wound to left anterior lower leg below knee  Trauma from dropped box.  Seen at wound care center.  Has appointment tomorrow and encouraged to follow up at this appointment for multi layer compression for chronic edema. Cellulitis to left leg, improving.  WBC 6.2.  Erythema and generalized edema to both lower legs Wound type:trauma Pressure Injury POA: Yes/No/NA Measurement: 4 cm x 2 cm x 0.3 cm wound bed is 100% fibrin slough Wound bed: see above Drainage (amount, consistency, odor) minimal serous weeping  Periwound: edema and erythema Dressing procedure/placement/frequency: Dressing changed.  Continue Aquacel Ag  Follow up at wound care center tomorrow with Dr Dellia Nims Will not follow at this time.  Please re-consult if needed.  Mia Moras MSN, RN, FNP-BC [facility]ON Wound, Ostomy, Continence Nurse Pager [phone]

## 2018-10-14 NOTE — Discharge Summary (Addendum)
Discharge Summary  Mia Contreras BZJ:[id number] DOB: Jul 18, 1929  PCP: Cassandria Anger, MD  Admit date: 10/08/2018 Discharge date: 10/14/2018  Time spent: 35 minutes  UPDATE: DC delayed due to persistent significant hypoxia ([place] in the 80's when ambulating on RA; not on O2 at baseline) requiring further evaluation and treatment. Will obtain CT chest to further assess her hypoxia. On Eliquis for chronic A-fib.  Recommendations for Outpatient Follow-up:  1. Follow-up with your general surgeon Dr. Dellia Nims tomorrow 2. Follow-up with your PCP within a week 3. Take medication as prescribed 4. Continue wound care at home 5. Continue PT 6. Fall precautions   Discharge Diagnoses:  Active Hospital Problems   Diagnosis Date Noted  . Cellulitis due to methicillin-resistant Staphylococcus aureus (MRSA) 10/09/2018  . Atrial fibrillation ([facility]) 01/11/2016  . Chronic systolic heart failure ([facility]) 08/22/2013    Resolved Hospital Problems  No resolved problems to display.    Discharge Condition: Stable  Diet recommendation: Resume previous diet, heart healthy  Vitals:   10/14/18 1035 10/14/18 1419  BP: 96/71 116/64  Pulse:  94  Resp:  18  Temp:  (!) 97.4 F (36.3 C)  SpO2:  (!) 84%    History of present illness:  Mia Marzano Sorrellis a 85 y.o.femalewith medical history significant ofsCHF, chronic A.Fib, HTN, BRCA. Recent and recurrent cellulitis and ulcers of LLEthat has been worsening despite seeing wound care with Dr. Dellia Nims for past 3 months or so.  Culture in Jan of wound grew MRSA. Attempted to treat with doxycycline and then bactrim. Unfortunatelyerythema has progressively worsened with worsening drainage of wound. Patient sent in to ED for failed outpatient treatment.  Completed 7 days of IV vancomycin. Hospital course complicated by generalized weakness with recommendation for home health PT by physical therapy.   10/14/18: Patient seen and examined at  bedside.  No acute events overnight.  Afebrile with no leukocytosis.  She has no new complaints.  Ambulated with physical therapy with recommendations for home health PT.  Patient has an appointment with her general surgeon Dr. Dellia Nims tomorrow 10/15/2018.  On the day of discharge, the patient was hemodynamically stable.  She will need to follow-up with her general surgeon for wound care and also later with her PCP.    Hospital Course:  Principal Problem:   Cellulitis due to methicillin-resistant Staphylococcus aureus (MRSA) Active Problems:   Chronic systolic heart failure (HCC)   Atrial fibrillation (H[facility])  MRSA cellulitis of right lower extremity, failed outpatient treatment Completed 7 days of IV vancomycin Culture of wound grew MRSA last month No leukocytosis and afebrile Continue dressing changes as recommended by wound care specialist Follow-up with general surgery outpatient Dr Dellia Nims on 10/15/18  Acute hypoxic respiratory failure Failed home O2 evaluation with O2 saturation dropping to 83% on room air while ambulating Will require at least 1 L of oxygen by nasal cannula to maintain O2 saturation greater than 92% Obtain a CTA for further evaluation We will discharge home with DME oxygen if CT chest with no contrast is unremarkable for any acute findings No use of IV contrast due to recent AKI Continue O2 saturation greater than 01%  Chronic systolic CHF Not in acute exacerbation Last 2D echo revealed LVEF 20 to 25% Continue strict I's and O's and daily weight Good urine output net -5.3 L since admission Continue torsemide 40 mg daily Follow-up with your cardiologist outpatient  Resolved AKI on CKD 3 Presented with creatinine of 1.29 Baseline creatinine 0.9 with GFR 51 Creatinine  is back to her baseline of 0.9 on 10/14/2018 Continue to avoid nephrotoxic agents Good urine output net -5.3 L since admission Follow-up with your PCP  Physical debility/generalized  weakness PT recommends home health PT Fall precautions  Chronic atrial fibrillation Rate is well controlled on Coreg, continue On Eliquis for primary CVA prevention, continue Follow-up with cardiology outpatient  Essential hypertension Stable Follow-up with your PCP Continue Coreg  Chronic anxiety Continue Xanax 0.5 mg twice daily   DVT prophylaxis:Eliquis Code Status:Partial code, DNI      Discharge Exam: BP 116/64 (BP Location: Right Arm)   Pulse 94   Temp (!) 97.4 F (36.3 C) (Oral)   Resp 18   Ht '5\' 3"'$  (1.6 m)   Wt 54.5 kg   SpO2 (!) 84%   BMI 21.29 kg/m  . General: 85 y.o. year-old female well developed well nourished in no acute distress.  Alert and oriented x3. . Cardiovascular: Irregular rate and rhythm with no rubs or gallops.  No thyromegaly or JVD noted.   Marland Kitchen Respiratory: Clear to auscultation with no wheezes or rales. Good inspiratory effort. . Abdomen: Soft nontender nondistended with normal bowel sounds x4 quadrants. . Musculoskeletal: No lower extremity edema. 2/4 pulses in all 4 extremities. . Skin: 1+ pitting edema in lower extremities bilaterally left mildly greater than right.  Improved erythema also noted from left lower extremity. Marland Kitchen Psychiatry: Mood is appropriate for condition and setting  Discharge Instructions You were cared for by a hospitalist during your hospital stay. If you have any questions about your discharge medications or the care you received while you were in the hospital after you are discharged, you can call the unit and asked to speak with the hospitalist on call if the hospitalist that took care of you is not available. Once you are discharged, your primary care physician will handle any further medical issues. Please note that NO REFILLS for any discharge medications will be authorized once you are discharged, as it is imperative that you return to your primary care physician (or establish a relationship with a primary care  physician if you do not have one) for your aftercare needs so that they can reassess your need for medications and monitor your lab values.   Allergies as of 10/14/2018      Reactions   Cefuroxime Diarrhea   Celecoxib Nausea Only   Deltasone [prednisone] Swelling   Oral deltasone is causing swelling (can use Depo-Medrol)   Ranitidine Other (See Comments)   bloating   Spironolactone Other (See Comments)   High K   Tapazole [methimazole] Rash   Tramadol Hcl Nausea Only   Entresto [sacubitril-valsartan] Other (See Comments)   Unknown reaction per pt      Medication List    TAKE these medications   ALPRAZolam 0.5 MG tablet Commonly known as:  XANAX Take 1 tablet (0.5 mg total) by mouth 2 (two) times daily as needed for anxiety. What changed:    how much to take  when to take this  reasons to take this   apixaban 2.5 MG Tabs tablet Commonly known as:  ELIQUIS Take 1 tablet (2.5 mg total) by mouth 2 (two) times daily.   carvedilol 3.125 MG tablet Commonly known as:  COREG Take 1 tablet (3.125 mg total) by mouth 2 (two) times daily with a meal.   coal tar 0.5 % shampoo Commonly known as:  NEUTROGENA T-GEL Apply 1 application topically once a week.   diphenoxylate-atropine 2.5-0.025 MG tablet Commonly  known as:  LOMOTIL Take 1 tablet by mouth 3 (three) times daily. What changed:    when to take this  reasons to take this   doxycycline 100 MG tablet Commonly known as:  VIBRA-TABS Take 1 tablet (100 mg total) by mouth every 12 (twelve) hours for 7 days.   ketoconazole 2 % shampoo Commonly known as:  NIZORAL Apply 1 application topically 2 (two) times a week.   loratadine 10 MG tablet Commonly known as:  CLARITIN Take 1 tablet (10 mg total) by mouth daily as needed for allergies.   potassium chloride SA 20 MEQ tablet Commonly known as:  K-DUR,KLOR-CON Take 1 tablet (20 mEq total) by mouth daily.   SYSTANE ULTRA PF OP Place 2 drops into both eyes daily as  needed (dry eyes).   torsemide 20 MG tablet Commonly known as:  DEMADEX Take 2 tablets (40 mg total) by mouth daily.   triamcinolone ointment 0.1 % Commonly known as:  KENALOG Apply 1 application topically 2 (two) times daily. Use qd -bid What changed:    when to take this  reasons to take this  additional instructions            Durable Medical Equipment  (From admission, onward)         Start     Ordered   10/14/18 1345  For home use only DME oxygen  Once    Question Answer Comment  Mode or (Route) Nasal cannula   Liters per Minute 2   Frequency Continuous (stationary and portable oxygen unit needed)   Oxygen conserving device Yes   Oxygen delivery system Gas      10/14/18 1344         Allergies  Allergen Reactions  . Cefuroxime Diarrhea  . Celecoxib Nausea Only  . Deltasone [Prednisone] Swelling    Oral deltasone is causing swelling (can use Depo-Medrol)  . Ranitidine Other (See Comments)    bloating  . Spironolactone Other (See Comments)    High K  . Tapazole [Methimazole] Rash  . Tramadol Hcl Nausea Only  . Entresto [Sacubitril-Valsartan] Other (See Comments)    Unknown reaction per pt   Follow-up Information    Plotnikov, Evie Lacks, MD. Call in 1 day(s).   Specialty:  Internal Medicine Why:  Please call for a post hospital follow-up appointment. Contact information: [place] [address] [phone]        Josue Hector, MD .   Specialty:  Cardiology Contact information: [phone] [place]  [address] [place] Follow up.   Why:  will be out to the house for delivery of 02 and RN, PT and Aide Contact information: [address] [place] [address] [phone]            The results of significant diagnostics from this hospitalization (including imaging, microbiology, ancillary and laboratory) are listed below for reference.     Significant Diagnostic Studies: Dg Chest 2 View  Result Date: 10/08/2018 CLINICAL DATA:  Hypoxia EXAM: CHEST - 2 VIEW COMPARISON:  09/16/2018, 06/18/2018, 06/06/2016, ultrasound 09/05/2013 FINDINGS: Cardiomegaly. No pleural effusion. Moderate hiatal hernia. Aortic atherosclerosis. Left paratracheal opacity, likely corresponding to enlarged thyroid on prior ultrasound IMPRESSION: No active cardiopulmonary disease. Cardiomegaly. Large hiatal hernia Electronically Signed   By: Donavan Foil M.D.   On: 10/08/2018 22:49   Dg Chest 2 View  Result Date: 09/16/2018  CLINICAL DATA:  Cough and shortness of Breath EXAM: CHEST - 2 VIEW COMPARISON:  06/18/2018 FINDINGS: Cardiac shadow is enlarged. Aortic calcifications are again identified and stable. The lungs are well aerated bilaterally. Focal eventration of the left hemidiaphragm is noted and stable. No focal infiltrate or sizable effusion. No acute bony abnormality noted. IMPRESSION: No acute abnormality noted. Electronically Signed   By: Inez Catalina M.D.   On: 09/16/2018 12:47    Microbiology: No results found for this or any previous visit (from the past 240 hour(s)).   Labs: Basic Metabolic Panel: Recent Labs  Lab 10/08/18 2139 10/10/18 0451 10/11/18 0734 10/13/18 0452 10/14/18 0739  NA 139 140 139  --  139  K 4.5 4.4 4.4  --  4.3  CL 101 105 102  --  101  CO2 '28 29 30  '$ --  33*  GLUCOSE 93 94 88  --  95  BUN 32* 30* 25*  --  24*  CREATININE 1.29* 1.00 0.94 1.10* 0.99  CALCIUM 8.3* 8.0* 8.2*  --  7.9*  MG  --   --   --   --  2.2   Liver Function Tests: No results for input(s): AST, ALT, ALKPHOS, BILITOT, PROT, ALBUMIN in the last 168 hours. No results for input(s): LIPASE, AMYLASE in the last 168 hours. No results for input(s): AMMONIA in the last 168 hours. CBC: Recent Labs  Lab 10/08/18 2139 10/10/18 0451 10/13/18 0452 10/14/18 0739  WBC 6.1 5.6 6.9 6.4  NEUTROABS 3.7  --   --   --   HGB 12.7 11.5* 12.3 11.4*  HCT 42.2  38.1 42.2 37.6  MCV 102.4* 104.7* 105.2* 103.9*  PLT 198 186 231 201   Cardiac Enzymes: No results for input(s): CKTOTAL, CKMB, CKMBINDEX, TROPONINI in the last 168 hours. BNP: BNP (last 3 results) Recent Labs    06/18/18 1905  BNP 1,407.4*    ProBNP (last 3 results) Recent Labs    06/17/18 1544 07/23/18 1305 07/31/18 1118  PROBNP 1,573.0* 8,739* 7,046*    CBG: No results for input(s): GLUCAP in the last 168 hours.     Signed:  Kayleen Memos, MD [facility] 10/14/2018, 2:42 PM

## 2018-10-14 NOTE — Progress Notes (Signed)
   10/14/18 1855  Oxygen Therapy  SpO2 (!) 86 %  O2 Device Room Air  Patient Activity (if Appropriate) In chair (at rest)

## 2018-10-14 NOTE — Care Management Important Message (Signed)
Important Message  Patient Details  Name: Mia Contreras MRN: [id number] Date of Birth: [birth year]   Medicare Important Message Given:  Yes    Kerin Salen 10/14/2018, 12:10 [place] Message  Patient Details  Name: Mia Contreras MRN: [id number] Date of Birth: 05-Dec-1928   Medicare Important Message Given:  Yes    Kerin Salen 10/14/2018, 12:10 PM

## 2018-10-15 NOTE — Progress Notes (Signed)
SATURATION QUALIFICATIONS: (This note is used to comply with regulatory documentation for home oxygen)  Patient Saturations on Room Air at Rest = 88%  Patient Saturations on Room Air while Ambulating = 85%  Patient Saturations on 2 Liters of oxygen while Ambulating = 93%  Please briefly explain why patient needs home oxygen: Pt saturation decreases while ambulating and at rest.

## 2018-10-15 NOTE — Care Management (Signed)
[place] screen done by nursing staff. [facility] rep alerted of need for home [address] Marney Doctor RN,BSN [phone]

## 2018-10-15 NOTE — Discharge Summary (Signed)
Discharge Summary  Mia Contreras HXT:[id number] DOB: [birth year]  PCP: Mia Anger, MD  Admit date: 10/08/2018 Discharge date: 10/15/2018  Time spent: 35 minutes  UPDATE: DC delayed due to persistent significant hypoxia ([place] in the 80's when ambulating on RA; not on O2 at baseline) requiring further evaluation and treatment. Will obtain CT chest to further assess her hypoxia. On Eliquis for chronic A-fib.  Recommendations for Outpatient Follow-up:  1. Follow-up with your general surgeon Dr. Dellia Contreras tomorrow 2. Follow-up with your PCP within a week 3. Take medication as prescribed 4. Continue wound care at home 5. Continue PT 6. Fall precautions   Discharge Diagnoses:  Active Hospital Problems   Diagnosis Date Noted  . Cellulitis due to methicillin-resistant Staphylococcus aureus (MRSA) 10/09/2018  . Atrial fibrillation ([facility]) 01/11/2016  . Chronic systolic heart failure ([facility]) 08/22/2013    Resolved Hospital Problems  No resolved problems to display.    Discharge Condition: Stable  Diet recommendation: Resume previous diet, heart healthy  Vitals:   10/14/18 2231 10/15/18 0632  BP: 99/64 107/71  Pulse: 64 72  Resp: 16 18  Temp: 98 F (36.7 C) 98.7 F (37.1 C)  SpO2: 95% 94%    History of present illness:  Mia Contreras a 85 y.o.femalewith medical history significant ofsCHF, chronic A.Fib, HTN, BRCA. Recent and recurrent cellulitis and ulcers of LLEthat has been worsening despite seeing wound care with Dr. Dellia Contreras for past 3 months or so.  Culture in Jan of wound grew MRSA. Attempted to treat with doxycycline and then bactrim. Unfortunatelyerythema has progressively worsened with worsening drainage of wound. Patient sent in to ED for failed outpatient treatment.  Completed 7 days of IV vancomycin. Hospital course complicated by generalized weakness with recommendation for home health PT by physical therapy.   10/14/18: Patient seen and  examined at bedside.  No acute events overnight.  Afebrile with no leukocytosis.  She has no new complaints.  Ambulated with physical therapy with recommendations for home health PT.  Patient has an appointment with her general surgeon Dr. Dellia Contreras tomorrow 10/15/2018.  10/15/18: No new complaints. No acute events overnight.  On the day of discharge, the patient was hemodynamically stable.  She will need to follow-up with her general surgeon for wound care and also later with her PCP.    Hospital Course:  Principal Problem:   Cellulitis due to methicillin-resistant Staphylococcus aureus (MRSA) Active Problems:   Chronic systolic heart failure (H[facility])   Atrial fibrillation ([facility])  MRSA cellulitis of right lower extremity, failed outpatient treatment Completed 7 days of IV vancomycin Culture of wound grew MRSA last month No leukocytosis and afebrile Continue dressing changes as recommended by wound care specialist Follow-up with general surgery outpatient Dr Mia Contreras on 10/15/18  Acute hypoxic respiratory failure Failed home O2 evaluation with O2 saturation dropping to 83% on room air while ambulating Will require at least 1 L of oxygen by nasal cannula to maintain O2 saturation greater than 92% Obtain a CTA for further evaluation We will discharge home with DME oxygen if CT chest with no contrast is unremarkable for any acute findings No use of IV contrast due to recent AKI Continue O2 saturation greater than 16%  Chronic systolic CHF Not in acute exacerbation Last 2D echo revealed LVEF 20 to 25% Continue strict I's and O's and daily weight Good urine output net -5.3 L since admission Continue torsemide 40 mg daily Follow-up with your cardiologist outpatient  Resolved AKI on CKD 3 Presented with  creatinine of 1.29 Baseline creatinine 0.9 with GFR 51 Creatinine is back to her baseline of 0.9 on 10/14/2018 Continue to avoid nephrotoxic agents Good urine output net -5.3 L since  admission Follow-up with your PCP  Physical debility/generalized weakness PT recommends home health PT Fall precautions  Chronic atrial fibrillation Rate is well controlled on Coreg, continue On Eliquis for primary CVA prevention, continue Follow-up with cardiology outpatient  Essential hypertension Stable Follow-up with your PCP Continue Coreg  Chronic anxiety Continue Xanax 0.5 mg twice daily  Large hiatal hernia Seen on CT chest with contrast done on 10/14/2018 Increase in size compared to CT chest done on 12/01/2006  Moderate centrilobular emphysema Noted Maintain O2 saturation greater than 92% Continue O2 supplementation  History of left thyroid lobe mass Also present in imaging from 2008 Last TSH done on 05/06/2018 was 1.82 Vital signs stable Follow-up with your PCP   DVT prophylaxis:Eliquis Code Status:Partial code, DNI      Discharge Exam: BP 107/71 (BP Location: Right Arm)   Pulse 72   Temp 98.7 F (37.1 C) (Oral)   Resp 18   Ht _0  (1.6 m)   Wt 54.5 kg   SpO2 94%   BMI 21.29 kg/m  . General: 85 y.o. year-old female well-developed well-nourished in no acute distress.  Alert and within x3. . Cardiovascular: Irregular rate and rhythm with no rubs or gallops.  No JVD or thyromegaly . Respiratory: Clear to Auscultation with No Wheezes or Rales.  Good inspiratory effort. . Abdomen: Soft nontender nondistended with normal bowel sounds x4 quadrants. . Musculoskeletal: No lower extremity edema. 2/4 pulses in all 4 extremities. . Skin: 1+ pitting edema in lower extremities bilaterally left mildly greater than right.  Improved erythema also noted from left lower extremity. Marland Kitchen Psychiatry: Mood is appropriate for condition and setting  Discharge Instructions You were cared for by a hospitalist during your hospital stay. If you have any questions about your discharge medications or the care you received while you were in the hospital after you are  discharged, you can call the unit and asked to speak with the hospitalist on call if the hospitalist that took care of you is not available. Once you are discharged, your primary care physician will handle any further medical issues. Please note that NO REFILLS for any discharge medications will be authorized once you are discharged, as it is imperative that you return to your primary care physician (or establish a relationship with a primary care physician if you do not have one) for your aftercare needs so that they can reassess your need for medications and monitor your lab values.   Allergies as of 10/15/2018      Reactions   Cefuroxime Diarrhea   Celecoxib Nausea Only   Deltasone [prednisone] Swelling   Oral deltasone is causing swelling (can use Depo-Medrol)   Ranitidine Other (See Comments)   bloating   Spironolactone Other (See Comments)   High K   Tapazole [methimazole] Rash   Tramadol Hcl Nausea Only   Entresto [sacubitril-valsartan] Other (See Comments)   Unknown reaction per pt      Medication List    TAKE these medications   ALPRAZolam 0.5 MG tablet Commonly known as:  XANAX Take 1 tablet (0.5 mg total) by mouth 2 (two) times daily as needed for anxiety. What changed:    how much to take  when to take this  reasons to take this   apixaban 2.5 MG Tabs tablet Commonly known  asArne [place] Take 1 tablet (2.5 mg total) by mouth 2 (two) times daily.   carvedilol 3.125 MG tablet Commonly known as:  COREG Take 1 tablet (3.125 mg total) by mouth 2 (two) times daily with a meal.   coal tar 0.5 % shampoo Commonly known as:  NEUTROGENA T-GEL Apply 1 application topically once a week.   diphenoxylate-atropine 2.5-0.025 MG tablet Commonly known as:  LOMOTIL Take 1 tablet by mouth 3 (three) times daily. What changed:    when to take this  reasons to take this   doxycycline 100 MG tablet Commonly known as:  VIBRA-TABS Take 1 tablet (100 mg total) by mouth every 12  (twelve) hours for 7 days.   ketoconazole 2 % shampoo Commonly known as:  NIZORAL Apply 1 application topically 2 (two) times a week.   loratadine 10 MG tablet Commonly known as:  CLARITIN Take 1 tablet (10 mg total) by mouth daily as needed for allergies.   potassium chloride SA 20 MEQ tablet Commonly known as:  K-DUR,KLOR-CON Take 1 tablet (20 mEq total) by mouth daily.   SYSTANE ULTRA PF OP Place 2 drops into both eyes daily as needed (dry eyes).   torsemide 20 MG tablet Commonly known as:  DEMADEX Take 2 tablets (40 mg total) by mouth daily.   triamcinolone ointment 0.1 % Commonly known as:  KENALOG Apply 1 application topically 2 (two) times daily. Use qd -bid What changed:    when to take this  reasons to take this  additional instructions            Durable Medical Equipment  (From admission, onward)         Start     Ordered   10/14/18 1345  For home use only DME oxygen  Once    Question Answer Comment  Mode or (Route) Nasal cannula   Liters per Minute 2   Frequency Continuous (stationary and portable oxygen unit needed)   Oxygen conserving device Yes   Oxygen delivery system Gas      10/14/18 1344         Allergies  Allergen Reactions  . Cefuroxime Diarrhea  . Celecoxib Nausea Only  . Deltasone [Prednisone] Swelling    Oral deltasone is causing swelling (can use Depo-Medrol)  . Ranitidine Other (See Comments)    bloating  . Spironolactone Other (See Comments)    High K  . Tapazole [Methimazole] Rash  . Tramadol Hcl Nausea Only  . Entresto [Sacubitril-Valsartan] Other (See Comments)    Unknown reaction per pt   Follow-up Information    Plotnikov, Evie Lacks, MD. Call in 1 day(s).   Specialty:  Internal Medicine Why:  Please call for a post hospital follow-up appointment. Contact information: [place] [address] [phone]        Josue Hector, MD .   Specialty:  Cardiology Contact information: [phone] [place] [address] [facility] Follow up.   Why:  will be out to the house for delivery of 02 and RN, PT and Aide Contact information: [address] [place] [address] [phone]            The results of significant diagnostics from this hospitalization (including imaging, microbiology, ancillary and laboratory) are listed below for reference.    Significant Diagnostic Studies: Dg Chest 2 View  Result Date: 10/08/2018 CLINICAL DATA:  Hypoxia EXAM: CHEST - 2 VIEW COMPARISON:  09/16/2018, 06/18/2018, 06/06/2016, ultrasound 09/05/2013 FINDINGS: Cardiomegaly. No pleural effusion. Moderate hiatal hernia. Aortic atherosclerosis. Left paratracheal opacity, likely corresponding to enlarged thyroid on prior ultrasound IMPRESSION: No active cardiopulmonary disease. Cardiomegaly. Large hiatal hernia Electronically Signed   By: Donavan Foil M.D.   On: 10/08/2018 22:49   Dg Chest 2 View  Result Date: 09/16/2018 CLINICAL DATA:  Cough and shortness of Breath EXAM: CHEST - 2 VIEW COMPARISON:  06/18/2018 FINDINGS: Cardiac shadow is enlarged. Aortic calcifications are again identified and stable. The lungs are well aerated bilaterally. Focal eventration of the left hemidiaphragm is noted and stable. No focal infiltrate or sizable effusion. No acute bony abnormality noted. IMPRESSION: No acute abnormality noted. Electronically Signed   By: Inez Catalina M.D.   On: 09/16/2018 12:47   Ct Chest Wo Contrast  Result Date: 10/14/2018 CLINICAL DATA:  Hypoxia, cough. History of interstitial lung disease. History of atrial fibrillation and chronic systolic heart failure. EXAM: CT CHEST WITHOUT CONTRAST TECHNIQUE: Multidetector CT imaging of the chest was performed following the standard protocol without IV contrast. COMPARISON:  Chest x-ray dated 10/08/2018. Chest CT dated 12/01/2006. FINDINGS: Cardiovascular: Cardiomegaly. No  significant pericardial effusion seen. Diffuse coronary artery calcifications. Aortic atherosclerosis. No thoracic aortic aneurysm. Mediastinum/Nodes: No enlarged lymph nodes seen. Large hiatal hernia, increased in size compared to the previous CT of 12/01/2006. LEFT thyroid lobe mass, with retrosternal extension, stable to slightly increased in size compared to the previous CT. Lungs/Pleura: Small LEFT pleural effusion and associated mild atelectasis. Again noted is central lobular emphysema bilaterally, upper lobe predominant, at least moderate in degree and similar to the previous chest CT. No pulmonary nodule or mass appreciated. No evidence of pneumonia. No pneumothorax. Upper Abdomen: No acute findings within the upper abdomen. Prominence of the IVC suggesting RIGHT heart failure. Musculoskeletal: Degenerative changes throughout the scoliotic thoracolumbar spine, mild to moderate in degree. No acute appearing osseous abnormality. IMPRESSION: 1. Large hiatal hernia, increased in size compared to the previous CT of 12/01/2006. 2. Small LEFT pleural effusion and associated mild atelectasis. 3. No evidence of pneumonia or pulmonary edema. 4. Central lobular emphysema bilaterally, upper lobe predominant, at least moderate in degree and similar to the previous chest CT. 5. Cardiomegaly. 6. LEFT thyroid lobe mass, with retrosternal extension, stable to slightly increased in size compared to the previous CT of 12/01/2006, presumed multinodular goiter. Previous nuclear medicine thyroid scan was nondiagnostic. Aortic Atherosclerosis (ICD10-I70.0) and Emphysema (ICD10-J43.9). Electronically Signed   By: Franki Cabot M.D.   On: 10/14/2018 15:08    Microbiology: No results found for this or any previous visit (from the past 240 hour(s)).   Labs: Basic Metabolic Panel: Recent Labs  Lab 10/08/18 2139 10/10/18 0451 10/11/18 0734 10/13/18 0452 10/14/18 0739  NA 139 140 139  --  139  K 4.5 4.4 4.4  --  4.3  CL  101 105 102  --  101  CO2 _0 --  33*  GLUCOSE 93 94 88  --  95  BUN 32* 30* 25*  --  24*  CREATININE 1.29* 1.00 0.94 1.10* 0.99  CALCIUM 8.3* 8.0* 8.2*  --  7.9*  MG  --   --   --   --  2.2   Liver Function Tests: No results for input(s): AST, ALT, ALKPHOS, BILITOT, PROT, ALBUMIN in the last 168 hours. No results for input(s): LIPASE, AMYLASE in the last 168 hours. No results for input(s): AMMONIA  in the last 168 hours. CBC: Recent Labs  Lab 10/08/18 2139 10/10/18 0451 10/13/18 0452 10/14/18 0739  WBC 6.1 5.6 6.9 6.4  NEUTROABS 3.7  --   --   --   HGB 12.7 11.5* 12.3 11.4*  HCT 42.2 38.1 42.2 37.6  MCV 102.4* 104.7* 105.2* 103.9*  PLT 198 186 231 201   Cardiac Enzymes: No results for input(s): CKTOTAL, CKMB, CKMBINDEX, TROPONINI in the last 168 hours. BNP: BNP (last 3 results) Recent Labs    06/18/18 1905  BNP 1,407.4*    ProBNP (last 3 results) Recent Labs    06/17/18 1544 07/23/18 1305 07/31/18 1118  PROBNP 1,573.0* 8,739* 7,046*    CBG: No results for input(s): GLUCAP in the last 168 hours.     Signed:  Kayleen Memos, MD [facility] 10/15/2018, 12:53 PM

## 2018-10-16 ENCOUNTER — Ambulatory Visit (INDEPENDENT_AMBULATORY_CARE_PROVIDER_SITE_OTHER): Payer: Medicare Other | Admitting: Nurse Practitioner

## 2018-10-16 ENCOUNTER — Encounter: Payer: Self-pay | Admitting: Nurse Practitioner

## 2018-10-16 ENCOUNTER — Telehealth: Payer: Self-pay | Admitting: *Deleted

## 2018-10-16 VITALS — BP 96/58 | HR 125 | Ht 63.0 in | Wt 127.8 lb

## 2018-10-16 DIAGNOSIS — S81812D Laceration without foreign body, left lower leg, subsequent encounter: Secondary | ICD-10-CM | POA: Diagnosis not present

## 2018-10-16 DIAGNOSIS — I5022 Chronic systolic (congestive) heart failure: Secondary | ICD-10-CM

## 2018-10-16 DIAGNOSIS — I4821 Permanent atrial fibrillation: Secondary | ICD-10-CM

## 2018-10-16 DIAGNOSIS — I5082 Biventricular heart failure: Secondary | ICD-10-CM | POA: Diagnosis not present

## 2018-10-16 DIAGNOSIS — L97211 Non-pressure chronic ulcer of right calf limited to breakdown of skin: Secondary | ICD-10-CM | POA: Diagnosis not present

## 2018-10-16 DIAGNOSIS — I5023 Acute on chronic systolic (congestive) heart failure: Secondary | ICD-10-CM | POA: Diagnosis not present

## 2018-10-16 DIAGNOSIS — I890 Lymphedema, not elsewhere classified: Secondary | ICD-10-CM | POA: Diagnosis not present

## 2018-10-16 DIAGNOSIS — I87311 Chronic venous hypertension (idiopathic) with ulcer of right lower extremity: Secondary | ICD-10-CM | POA: Diagnosis not present

## 2018-10-16 MED ORDER — METOLAZONE 2.5 MG PO TABS: 2.5000 mg | ORAL_TABLET | Freq: Every day | ORAL | 0 refills | Status: DC

## 2018-10-16 NOTE — Telephone Encounter (Signed)
Transition Care Management Follow-up Telephone Call   Date discharged? 10/15/18   How have you been since you were released from the hospital? Pt states she is doing so so. Just woke up from a nap   Do you understand why you were in the hospital? YES   Do you understand the discharge instructions? YES   Where were you discharged to? Home   Items Reviewed:  Medications reviewed: YES  Allergies reviewed: YES  Dietary changes reviewed: YES, heart healthy  Referrals reviewed: YES   Functional Questionnaire:   Activities of Daily Living (ADLs):   She states she are independent in the following: bathing and hygiene, feeding, continence, grooming and toileting States they require assistance with the following: ambulation and dressing   Any transportation issues/concerns?: NO   Any patient concerns? NO   Confirmed importance and date/time of follow-up visits scheduled YES, appt 10/23/18  Provider Appointment booked with Dr. Alain Marion   Confirmed with patient if condition begins to worsen call PCP or go to the ER.  Patient was given the office number and encouraged to call back with question or concerns.  : YES

## 2018-10-16 NOTE — Patient Instructions (Addendum)
We will be checking the following labs today - NONE   Medication Instructions:    Continue with your current medicines. BUT  I am adding Zaroxolyn 2.5 mg to take daily for the next 3 days only and then STOP - take about 1/2 hour prior to the Torsemide - this is at your pharmacy.   Take an extra potassium for the next 3 days only.    If you need a refill on your cardiac medications before your next appointment, please call your pharmacy.     Testing/Procedures To Be Arranged:  N/A  Follow-Up:   See me in one week - we will recheck lab at that visit     At [facility], you and your health needs are our priority.  As part of our continuing mission to provide you with exceptional heart care, we have created designated Provider Care Teams.  These Care Teams include your primary Cardiologist (physician) and Advanced Practice Providers (APPs -  Physician Assistants and Nurse Practitioners) who all work together to provide you with the care you need, when you need it.  Special Instructions:  . Think about what we talked about today.   Call the [facility] office at [phone] if you have any questions, problems or concerns.

## 2018-10-16 NOTE — Progress Notes (Signed)
CARDIOLOGY OFFICE NOTE  Date:  10/16/2018    Mia Contreras Date of Birth: 1928-08-31 Medical Record #[id number]  PCP:  Cassandria Anger, MD  Cardiologist:  Gillian Shields    Chief Complaint  Patient presents with  . Congestive Heart Failure    Work in/post hospital visit - seen for Dr. Johnsie Cancel    History of Present Illness: Mia Contreras is a 85 y.o. female who presents today for a work in/post hospital visit.  Seen for Dr. Johnsie Cancel.   She has a history of NICM with EF of45-50% onpriorecho 2017 (previously 30-35% in 2016),hypertension, hyperlipidemia, CKD stage III, breast cancer and persistent atrial fibrillation on a apixaban.  Shewas admitted to the hospital 06/18/2018 for evaluation of weight gain and lower extremity edema despite extra torsemide at home. She had had a recurrence of stage I breast cancer and underwent lumpectomy 06/11/2018 and following her surgery peoplewerebringing food over to her home. She may have been eating more salt than she usually does. BNP was 1407 and chest x-ray showed mild vascular congestion.Echo on 06/19/2018 showed EF down to 20-25%with dyskinesis of the anteroseptal myocardium.She had no chest pain and does haveCKD (sees Dr. Myrla Halsted she was not felt to be a candidate for cardiac catheterization to evaluate her reduced EF. She was diuresed with IV Lasix with improvement,weight loss of about 10 pounds and discharged home on her usual torsemide.Shewas deemed toalso notbea candidate for the addition of ACE inhibitor, ARB or ARNI.Noted to basically have biventricular failure and complicated by her severe CKD.Blood pressure was low in the 90W-409 systolic, therefore she would not be able to tolerate further afterload reduction.It was the patient's opinion thatthe IV NS fluids given with surgery caused her volume overload.  She was seen for a post hospital visit by Daune Perch, NP - she was improved at that  time.  She did have a wound on her leg. Had a few phone calls since - had weight gain - had extra salt - had diarrhea and continued issues with her leg wound.I then saw her a few times this past December of 2019 - very confused about her condition/health. Seemed mixed up on her medicines to me. Lots of swelling. Weight was up. Poor social situation with no real support/no family/lives alone. Deemed to not be a candidate for ACE/ARB/ARNI but was on Entresto - potassium got high - this was stopped. She was not going to be able to afford and had lots of diarrhea with it as well. She had lots of swelling on exam and I gave her a 3 day course of Zaroxolyn. I started an end of life conversation as well. She was improved on last follow up with me.   Last seen by Dr. Johnsie Cancel in January - was felt to be doing well. Limited walking due to significant scoliosis. She continues to be followed in the wound clinic. She has had MRSA cultured.   Admitted earlier this month - just discharged yesterday - has cellulitis that failed on outpatient therapy - she required 7 days of IV Vancomycin. Also quite hypoxic - had CT scan. Remains on Eliquis. Stay was complicated by generalized weakness. She was placed on oxygen.   Comes in today. Herewith a friend Comptroller. Hasn't been home 24 hours. Doing poorly. Hard to get around. Did not come with her oxygen. Remains hypoxic. HR is fast. More swelling on exam and her weight is up considerably. She feels weak. No active chest  pain.   Past Medical History:  Diagnosis Date  . AKI (acute kidney injury) ([place]) 01/2016  . ANEMIA-NOS 08/20/2007  . ANXIETY 03/16/2007  . BREAST CANCER, HX OF 03/16/2007  . CARPAL TUNNEL SYNDROME, RIGHT 01/11/2008  . Cataract    floaters  . CELLULITIS, LEG, RIGHT 08/20/2007  . CHF (congestive heart failure) ([facility])   . Collagenous colitis   . Congestive heart failure with right ventricular systolic dysfunction ([place]) 06/19/2018  . COPD (chronic  obstructive pulmonary disease) ([place])   . DIVERTICULOSIS, COLON 08/20/2007  . Dyspnea    on exertion  . Dysrhythmia    atrial fib  . Family history of brain tumor   . Family history of lung cancer   . Family history of lymphoma   . Family history of prostate cancer   . Family history of skin cancer   . GOITER, MULTINODULAR 05/06/2010   Dr [name]  . HYPERLIPIDEMIA 08/20/2007  . HYPERTENSION 03/16/2007  . HYPERTHYROIDISM 05/23/2010  . IBS (irritable bowel syndrome)   . OSTEOARTHRITIS 03/16/2007  . OSTEOPOROSIS 08/20/2007  . Personal history of breast cancer 07/01/2018  . Pneumonia 07/2011   took abx for several weeks  . PREMATURE ATRIAL CONTRACTIONS 09/21/2010  . SCOLIOSIS 01/11/2008  . Severe tricuspid regurgitation 06/19/2018  . SKIN CANCER, HX OF 11/12/2008    R cheek 2010, L Cheek 2011 Dr. Tonia Brooms  . Thyroid nodule   . Uterine cancer ([place])   . VENOUS INSUFFICIENCY 09/05/2007    Past Surgical History:  Procedure Laterality Date  . ABDOMINAL HYSTERECTOMY    . APPENDECTOMY  1972  . BREAST LUMPECTOMY Left 2001  . BREAST LUMPECTOMY WITH RADIOACTIVE SEED LOCALIZATION Left 06/11/2018   Procedure: LEFT BREAST LUMPECTOMY WITH BRACKETED RADIOACTIVE SEED LOCALIZATION;  Surgeon: Fanny Skates, MD;  Location: [facility];  Service: General;  Laterality: Left;  . CARPAL TUNNEL RELEASE    . CATARACT EXTRACTION    . EYE SURGERY     cataract ext  . HERNIA REPAIR    . MASTECTOMY, PARTIAL Left   . SKIN CANCER EXCISION     face-Dr. Bing Plume  . TOTAL KNEE ARTHROPLASTY    . TOTAL KNEE ARTHROPLASTY Right 2010   Dr Para March  . TOTAL KNEE ARTHROPLASTY  10/30/2011   Procedure: TOTAL KNEE ARTHROPLASTY;  Surgeon: Lorn Junes, MD;  Location: [facility];  Service: Orthopedics;  Laterality: Left;     . XRT     chemo, surgery     Medications: Current Meds  Medication Sig  . ALPRAZolam (XANAX) 0.5 MG tablet Take 1 tablet (0.5 mg total) by mouth 2 (two) times daily as needed for anxiety. (Patient taking  differently: Take 0.5-1 mg by mouth at bedtime as needed for anxiety or sleep. )  . apixaban (ELIQUIS) 2.5 MG TABS tablet Take 1 tablet (2.5 mg total) by mouth 2 (two) times daily.  . carvedilol (COREG) 3.125 MG tablet Take 1 tablet (3.125 mg total) by mouth 2 (two) times daily with a meal.  . coal tar (NEUTROGENA T-GEL) 0.5 % shampoo Apply 1 application topically once a week.   . diphenoxylate-atropine (LOMOTIL) 2.5-0.025 MG tablet Take 1 tablet by mouth 3 (three) times daily. (Patient taking differently: Take 1 tablet by mouth 3 (three) times daily as needed for diarrhea or loose stools. )  . doxycycline (VIBRA-TABS) 100 MG tablet Take 1 tablet (100 mg total) by mouth every 12 (twelve) hours for 7 days.  Marland Kitchen ketoconazole (NIZORAL) 2 % shampoo Apply 1 application topically 2 (  two) times a week.  . loratadine (CLARITIN) 10 MG tablet Take 1 tablet (10 mg total) by mouth daily as needed for allergies.  Vladimir Faster Glycol-Propyl Glycol (SYSTANE ULTRA PF OP) Place 2 drops into both eyes daily as needed (dry eyes).   . potassium chloride SA (K-DUR,KLOR-CON) 20 MEQ tablet Take 1 tablet (20 mEq total) by mouth daily.  Marland Kitchen torsemide (DEMADEX) 20 MG tablet Take 2 tablets (40 mg total) by mouth daily.  Marland Kitchen triamcinolone ointment (KENALOG) 0.1 % Apply 1 application topically 2 (two) times daily. Use qd -bid (Patient taking differently: Apply 1 application topically 2 (two) times daily as needed (on affected area of skin). )     Allergies: Allergies  Allergen Reactions  . Cefuroxime Diarrhea  . Celecoxib Nausea Only  . Deltasone [Prednisone] Swelling    Oral deltasone is causing swelling (can use Depo-Medrol)  . Ranitidine Other (See Comments)    bloating  . Spironolactone Other (See Comments)    High K  . Tapazole [Methimazole] Rash  . Tramadol Hcl Nausea Only  . Entresto [Sacubitril-Valsartan] Other (See Comments)    Unknown reaction per pt    Social History: The patient  reports that she quit  smoking about 37 years ago. She has never used smokeless tobacco. She reports previous alcohol use. She reports that she does not use drugs.   Family History: The patient's family history includes Alcohol abuse in her father; Alzheimer's disease in her mother; Bladder Cancer in her brother; Brain cancer (age of onset: 80) in her maternal uncle; Brain cancer (age of onset: 22) in her maternal aunt; Dementia in her mother; Diabetes in an other family member; Heart attack in her father, maternal grandfather, and maternal grandmother; Heart disease in her mother; Hypertension in her mother; Lung cancer in her paternal uncle; Lymphoma (age of onset: 57) in an other family member; Mental retardation in her mother; Prostate cancer in her brother; Skin cancer in an other family member.   Review of Systems: Please see the history of present illness.   Otherwise, the review of systems is positive for none.   All other systems are reviewed and negative.   Physical Exam: VS:  BP (!) 96/58 (BP Location: Left Arm, Patient Position: Sitting, Cuff Size: Small)   Pulse (!) 125   Ht 5\' 3"  (1.6 m)   Wt 127 lb 12.8 oz (58 kg)   SpO2 (!) 86% Comment: at rest  BMI 22.64 kg/m  .  BMI Body mass index is 22.64 kg/m.  Wt Readings from Last 3 Encounters:  10/16/18 127 lb 12.8 oz (58 kg)  10/08/18 120 lb 3.2 oz (54.5 kg)  09/27/18 116 lb (52.6 kg)    General: Elderly. She looks chronically ill. She did not bring her oxygen with her today. Her weight is up considerably.  HEENT: Normal.  Neck: Supple, no JVD, carotid bruits, or masses noted.  Cardiac: Regular rate and rhythm. No murmurs, rubs, or gallops. No edema.  Respiratory:  Lungs are clear to auscultation bilaterally with normal work of breathing.  GI: Soft and nontender.  MS: No deformity or atrophy. Gait and ROM intact.  Skin: Warm and dry. Color is normal.  Neuro:  Strength and sensation are intact and no gross focal deficits noted.  Psych: Alert,  appropriate and with normal affect.   LABORATORY DATA:  EKG:  EKG is not ordered today.  Lab Results  Component Value Date   WBC 6.4 10/14/2018   HGB 11.4 (L)  10/14/2018   HCT 37.6 10/14/2018   PLT 201 10/14/2018   GLUCOSE 95 10/14/2018   CHOL 130 11/04/2015   TRIG 77.0 11/04/2015   HDL 54.50 11/04/2015   LDLCALC 60 11/04/2015   ALT 58 (H) 09/16/2018   AST 75 (H) 09/16/2018   NA 139 10/14/2018   K 4.3 10/14/2018   CL 101 10/14/2018   CREATININE 0.99 10/14/2018   BUN 24 (H) 10/14/2018   CO2 33 (H) 10/14/2018   TSH 1.82 05/06/2018   INR 1.84 09/16/2018   HGBA1C 5.8 05/06/2018   MICROALBUR 0.6 03/23/2011     BNP (last 3 results) Recent Labs    06/18/18 1905  BNP 1,407.4*    ProBNP (last 3 results) Recent Labs    06/17/18 1544 07/23/18 1305 07/31/18 1118  PROBNP 1,573.0* 8,739* 7,046*     Other Studies Reviewed Today:  CT CHEST IMPRESSION 09/2018: 1. Large hiatal hernia, increased in size compared to the previous CT of 12/01/2006. 2. Small LEFT pleural effusion and associated mild atelectasis. 3. No evidence of pneumonia or pulmonary edema. 4. Central lobular emphysema bilaterally, upper lobe predominant, at least moderate in degree and similar to the previous chest CT. 5. Cardiomegaly. 6. LEFT thyroid lobe mass, with retrosternal extension, stable to slightly increased in size compared to the previous CT of 12/01/2006, presumed multinodular goiter. Previous nuclear medicine thyroid scan was nondiagnostic.  Aortic Atherosclerosis (ICD10-I70.0) and Emphysema (ICD10-J43.9).   Electronically Signed   By: Franki Cabot M.D.   On: 10/14/2018 15:08    Echocardiogram 06/19/18: Study Conclusions - Left ventricle: The cavity size was normal. Wall thickness was normal. Systolic function was severely reduced. The estimated ejection fraction was in the range of 20% to 25%. Dyskinesis of the anteroseptal myocardium. - Mitral valve: There was  mild regurgitation. - Left atrium: The atrium was mildly dilated. - Right ventricle: The cavity size was severely dilated. Systolic function was moderately reduced. - Right atrium: The atrium was severely dilated. - Tricuspid valve: There was moderate-severe regurgitation. - Pulmonary arteries: Systolic pressure was moderately increased. PA peak pressure: 58 mm Hg (S).    Assessment/Plan:  1.Chronic systolic HF - she has biventricular failure with valvular heart disease - complicated by CKD. Already deemed to not be a candidate for ACE/ARB/ARNI - no longer on Entresto due to diarrhea and will not be able to afford. BP remains soft which limits medical therapy. Her overall prognosis is quite poor. Will give 3 days of Zaroxolyn. We have continued to have an end of life conversation. She is very admanant about not being placed in a facility. She wishes to remain at home. She has no real social support other than her church. We have talked about Hospice and hiring help to stay with her. At this time she remains a full code. She wishes to have CPR, intubation, etc. We have talked about goals with end of life. She is very overwhelmed.   2. Permanent AF - on Eliquis. Rate is up - I suspect this is due to volume overload. BP is soft. Hopefully with diuresis she will improve but situation is quite tenuous at best.   3. CKD - stage 3 - followed by nephrology. Rechecking labs on return.   4. Cellulitis - prior MRSA + culture - just finished 7 day course of IV Vancoymcin  5. COPD - chronic degree of shortness of breath. Now on oxygen - CT from yesterday noted. Does not have with her at home - ?if she  can even manage this going forward.   6. Advanced age - options appear very limited to me - no real family support - we have discussed this today in great depth. Her friend [name] is quite Patent attorney. Her church family is very concerned but will not be able to provide the ongoing care that she  will need going forward.    7. HTN - BP too soft.   8. Generalized weakness - most likely multifactorial - see above.   Current medicines are reviewed with the patient today.  The patient does not have concerns regarding medicines other than what has been noted above.  The following changes have been made:  See above.  Labs/ tests ordered today include:   No orders of the defined types were placed in this encounter.    Disposition:   FU with [name] in about a month. Options felt to be quite limited. Needs living situation addressed as well as end of life planing.   Patient is agreeable to this plan and will call if any problems develop in the interim.   SignedTruitt Merle, NP  10/16/2018 9:28 AM  [facility] [address] [place], [place]  [address] Phone: [phone] Fax: [phone]

## 2018-10-17 DIAGNOSIS — L97211 Non-pressure chronic ulcer of right calf limited to breakdown of skin: Secondary | ICD-10-CM | POA: Diagnosis not present

## 2018-10-17 DIAGNOSIS — S81812D Laceration without foreign body, left lower leg, subsequent encounter: Secondary | ICD-10-CM | POA: Diagnosis not present

## 2018-10-17 DIAGNOSIS — I5023 Acute on chronic systolic (congestive) heart failure: Secondary | ICD-10-CM | POA: Diagnosis not present

## 2018-10-17 DIAGNOSIS — I5082 Biventricular heart failure: Secondary | ICD-10-CM | POA: Diagnosis not present

## 2018-10-17 DIAGNOSIS — I890 Lymphedema, not elsewhere classified: Secondary | ICD-10-CM | POA: Diagnosis not present

## 2018-10-17 DIAGNOSIS — I87311 Chronic venous hypertension (idiopathic) with ulcer of right lower extremity: Secondary | ICD-10-CM | POA: Diagnosis not present

## 2018-10-20 DIAGNOSIS — I890 Lymphedema, not elsewhere classified: Secondary | ICD-10-CM | POA: Diagnosis not present

## 2018-10-20 DIAGNOSIS — I5082 Biventricular heart failure: Secondary | ICD-10-CM | POA: Diagnosis not present

## 2018-10-20 DIAGNOSIS — S81812D Laceration without foreign body, left lower leg, subsequent encounter: Secondary | ICD-10-CM | POA: Diagnosis not present

## 2018-10-20 DIAGNOSIS — L97211 Non-pressure chronic ulcer of right calf limited to breakdown of skin: Secondary | ICD-10-CM | POA: Diagnosis not present

## 2018-10-20 DIAGNOSIS — I5023 Acute on chronic systolic (congestive) heart failure: Secondary | ICD-10-CM | POA: Diagnosis not present

## 2018-10-20 DIAGNOSIS — I87311 Chronic venous hypertension (idiopathic) with ulcer of right lower extremity: Secondary | ICD-10-CM | POA: Diagnosis not present

## 2018-10-21 ENCOUNTER — Telehealth: Payer: Self-pay | Admitting: Internal Medicine

## 2018-10-21 NOTE — Telephone Encounter (Signed)
Copied from [place] [id number]. Topic: Quick Communication - Home Health Verbal Orders >> Oct 21, 2018  4:53 PM Ivar Drape wrote: Caller/Agency:  Valda Favia w/[facility] Number: [phone] Requesting OT/PT/Skilled Nursing/Social Work:  Physical Therapy for strengthening and balancing and gait  Frequency: 1w1, 2w3

## 2018-10-22 ENCOUNTER — Telehealth: Payer: Self-pay | Admitting: Nurse Practitioner

## 2018-10-22 ENCOUNTER — Encounter: Payer: Self-pay | Admitting: Nurse Practitioner

## 2018-10-22 ENCOUNTER — Telehealth: Payer: Self-pay | Admitting: Internal Medicine

## 2018-10-22 ENCOUNTER — Other Ambulatory Visit: Payer: Self-pay | Admitting: Nurse Practitioner

## 2018-10-22 ENCOUNTER — Ambulatory Visit (INDEPENDENT_AMBULATORY_CARE_PROVIDER_SITE_OTHER): Payer: Medicare Other | Admitting: Nurse Practitioner

## 2018-10-22 ENCOUNTER — Encounter (HOSPITAL_BASED_OUTPATIENT_CLINIC_OR_DEPARTMENT_OTHER): Payer: Medicare Other | Attending: Internal Medicine

## 2018-10-22 VITALS — BP 90/60 | HR 74 | Ht 63.0 in | Wt 115.1 lb

## 2018-10-22 DIAGNOSIS — I509 Heart failure, unspecified: Secondary | ICD-10-CM | POA: Diagnosis not present

## 2018-10-22 DIAGNOSIS — L97822 Non-pressure chronic ulcer of other part of left lower leg with fat layer exposed: Secondary | ICD-10-CM | POA: Diagnosis not present

## 2018-10-22 DIAGNOSIS — Z923 Personal history of irradiation: Secondary | ICD-10-CM | POA: Diagnosis not present

## 2018-10-22 DIAGNOSIS — I110 Hypertensive heart disease with heart failure: Secondary | ICD-10-CM | POA: Diagnosis not present

## 2018-10-22 DIAGNOSIS — I5022 Chronic systolic (congestive) heart failure: Secondary | ICD-10-CM

## 2018-10-22 DIAGNOSIS — Z79899 Other long term (current) drug therapy: Secondary | ICD-10-CM | POA: Diagnosis not present

## 2018-10-22 DIAGNOSIS — I482 Chronic atrial fibrillation, unspecified: Secondary | ICD-10-CM | POA: Diagnosis not present

## 2018-10-22 DIAGNOSIS — I071 Rheumatic tricuspid insufficiency: Secondary | ICD-10-CM | POA: Diagnosis not present

## 2018-10-22 DIAGNOSIS — I87312 Chronic venous hypertension (idiopathic) with ulcer of left lower extremity: Secondary | ICD-10-CM | POA: Diagnosis not present

## 2018-10-22 DIAGNOSIS — I429 Cardiomyopathy, unspecified: Secondary | ICD-10-CM | POA: Diagnosis not present

## 2018-10-22 DIAGNOSIS — Z9221 Personal history of antineoplastic chemotherapy: Secondary | ICD-10-CM | POA: Diagnosis not present

## 2018-10-22 DIAGNOSIS — I890 Lymphedema, not elsewhere classified: Secondary | ICD-10-CM | POA: Diagnosis not present

## 2018-10-22 DIAGNOSIS — L97812 Non-pressure chronic ulcer of other part of right lower leg with fat layer exposed: Secondary | ICD-10-CM | POA: Insufficient documentation

## 2018-10-22 DIAGNOSIS — S80822A Blister (nonthermal), left lower leg, initial encounter: Secondary | ICD-10-CM | POA: Diagnosis not present

## 2018-10-22 DIAGNOSIS — J449 Chronic obstructive pulmonary disease, unspecified: Secondary | ICD-10-CM | POA: Insufficient documentation

## 2018-10-22 DIAGNOSIS — L97222 Non-pressure chronic ulcer of left calf with fat layer exposed: Secondary | ICD-10-CM | POA: Insufficient documentation

## 2018-10-22 DIAGNOSIS — I87331 Chronic venous hypertension (idiopathic) with ulcer and inflammation of right lower extremity: Secondary | ICD-10-CM | POA: Insufficient documentation

## 2018-10-22 LAB — BASIC METABOLIC PANEL
BUN/Creatinine Ratio: 32 — ABNORMAL HIGH (ref 12–28)
BUN: 44 mg/dL — ABNORMAL HIGH (ref 8–27)
CO2: 39 mmol/L — ABNORMAL HIGH (ref 20–29)
Calcium: 10.7 mg/dL — ABNORMAL HIGH (ref 8.7–10.3)
Chloride: 81 mmol/L — ABNORMAL LOW (ref 96–106)
Creatinine, Ser: 1.39 mg/dL — ABNORMAL HIGH (ref 0.57–1.00)
GFR calc Af Amer: 39 mL/min/{1.73_m2} — ABNORMAL LOW (ref >59)
GFR calc non Af Amer: 34 mL/min/{1.73_m2} — ABNORMAL LOW (ref >59)
Glucose: 78 mg/dL (ref 65–99)
Potassium: 3.2 mmol/L — ABNORMAL LOW (ref 3.5–5.2)
Sodium: 141 mmol/L (ref 134–144)

## 2018-10-22 LAB — PRO B NATRIURETIC PEPTIDE: NT-Pro BNP: 5759 pg/mL — ABNORMAL HIGH (ref 0–738)

## 2018-10-22 NOTE — Telephone Encounter (Signed)
New message   Amy is calling to get clarification on a referral that was sent to office on this patient. Need to know if referral is for palliative care or for hospice?

## 2018-10-22 NOTE — Patient Instructions (Addendum)
We will be checking the following labs today - BMET and BNP   Medication Instructions:    Continue with your current medicines.   Do not take any more Metolazone    If you need a refill on your cardiac medications before your next appointment, please call your pharmacy.     Testing/Procedures To Be Arranged:  N/A  Follow-Up:   See me in about 2 weeks.     At [facility], you and your health needs are our priority.  As part of our continuing mission to provide you with exceptional heart care, we have created designated Provider Care Teams.  These Care Teams include your primary Cardiologist (physician) and Advanced Practice Providers (APPs -  Physician Assistants and Nurse Practitioners) who all work together to provide you with the care you need, when you need it.  Special Instructions:  . I would advise calling [phone] or [facility] [phone] . I will send an order to have the Hospice staff come and speak with you.  . We have completed a DNR form today - place this on your refrigerator at home.   Call the [place] [facility] office at [phone] if you have any questions, problems or concerns.

## 2018-10-22 NOTE — Telephone Encounter (Signed)
S/w Amy at hospice to let know Cecille Rubin put pt on hospice today.  Hospice paperwork and Cherlynn Polo note was faxed to hospice.

## 2018-10-22 NOTE — Progress Notes (Signed)
CARDIOLOGY OFFICE NOTE  Date:  10/22/2018    Mia Contreras Date of Birth: [birth year] Medical Record #[id number]  PCP:  Mia Anger, MD  Cardiologist:  Mia Contreras    Chief Complaint  Patient presents with  . Congestive Heart Failure    1 week check - seen for Mia Contreras    History of Present Illness: Mia Contreras is a 85 y.o. female who presents today for a one week check. Seen for Mia Contreras.   She has a history of NICM with EF of45-50% onpriorecho 2017 (previously 30-35% in 2016),hypertension, hyperlipidemia, CKD stage III, breast cancer and persistent atrial fibrillation on apixaban.  Shewas admitted to the hospital 06/18/2018 for evaluation of weight gain and lower extremity edema despite extra torsemide at home. She had had a recurrence of stage I breast cancer and underwent lumpectomy 06/11/2018 and following her surgery peoplewerebringing food over to her home. She may have been eating more salt than she usually does. BNP was 1407 and chest x-ray showed mild vascular congestion.Echo on 06/19/2018 showed EF down to 20-25%with dyskinesis of the anteroseptal myocardium.She had no chest pain and does haveCKD (sees Dr. Myrla Halsted she was not felt to be a candidate for cardiac catheterization to evaluate her reduced EF. She was diuresed with IV Lasix with improvement,weight loss of about 10 pounds and discharged home on her usual torsemide.Shewas deemed toalso notbea candidate for the addition of ACE inhibitor, ARB or ARNI.Noted to basically have biventricular failure and complicated by her severe CKD.Blood pressure was low in the 27X-412 systolic, therefore she would not be able to tolerate further afterload reduction.It was the patient's opinion thatthe IV NS fluids given with surgery caused her volume overload.  She was seen for a post hospital visit by Mia Salvo, NP- she was improvedat that time.She did have a wound on her  leg. Had a few phone calls since - had weight gain - had extra salt - had diarrhea and continued issues with her leg wound.I then saw her a few times this past December of 2019 - very confused about her condition/health. Seemed mixed up on her medicines to me. Lots of swelling.Weight was up.Poor social situation with no real support/no family/lives alone. Deemed to not be a candidate for ACE/ARB/ARNI but was on Entresto - potassium got high - this was stopped. She was not going to be able to afford and had lots of diarrhea with it as well. She had lots of swelling on exam and I gave her a 3 day course of Zaroxolyn. I started an end of life conversation as well.She was improved on last follow up with me.   Last seen by Mia Contreras in January - was felt to be doing well. Limited walking due to significant scoliosis. She continues to be followed in the wound clinic. She has had MRSA cultured.   Admitted last month with cellulitis that failed on outpatient therapy - she required 7 days of IV Vancomycin. Also quite hypoxic - had CT scan. Remains on Eliquis. Stay was complicated by generalized weakness. She was placed on oxygen. I then saw her on follow up - she was doing poorly - did not come with her oxygen and remained hypoxic. Weight was way back up again - ended up putting her on a short course of Zaroxolyn. Very poor insight into the seriousness of her issues. Social situation very poor.   Comes in today. Here alone. [name] stayed in the waiting room today.  She is down 12 pounds. Her legs are just weeping. Has had lots of blisters that have "popped". Difficult to keep her legs wrapped. Going to the wound clinic later today. Seeing PCP tomorrow. We will get her labs here today. Not on any oxygen here today - using at night. Says her breathing is better. We had a long discussion about her social situation - pretty sad - still has a mortgage on her house - no savings. Unclear if she will be able to get any  assistance. She is asking about Hospice services and the self pay services ([facility] [place], [facility]). She is asking about DNR - says she has a living will. Does not wish to have CPR or be intubated. Says she just "lives one day at a time" - admits she has not given any thought to the future. Her brother in [place] is her POA. He is in an assisted living facility there. He actually sends her money.   Past Medical History:  Diagnosis Date  . AKI (acute kidney injury) ([place]) 01/2016  . ANEMIA-NOS 08/20/2007  . ANXIETY 03/16/2007  . BREAST CANCER, HX OF 03/16/2007  . CARPAL TUNNEL SYNDROME, RIGHT 01/11/2008  . Cataract    floaters  . CELLULITIS, LEG, RIGHT 08/20/2007  . CHF (congestive heart failure) ([place])   . Collagenous colitis   . Congestive heart failure with right ventricular systolic dysfunction ([place]) 06/19/2018  . COPD (chronic obstructive pulmonary disease) ([place])   . DIVERTICULOSIS, COLON 08/20/2007  . Dyspnea    on exertion  . Dysrhythmia    atrial fib  . Family history of brain tumor   . Family history of lung cancer   . Family history of lymphoma   . Family history of prostate cancer   . Family history of skin cancer   . GOITER, MULTINODULAR 05/06/2010   Dr [name]  . HYPERLIPIDEMIA 08/20/2007  . HYPERTENSION 03/16/2007  . HYPERTHYROIDISM 05/23/2010  . IBS (irritable bowel syndrome)   . OSTEOARTHRITIS 03/16/2007  . OSTEOPOROSIS 08/20/2007  . Personal history of breast cancer 07/01/2018  . Pneumonia 07/2011   took abx for several weeks  . PREMATURE ATRIAL CONTRACTIONS 09/21/2010  . SCOLIOSIS 01/11/2008  . Severe tricuspid regurgitation 06/19/2018  . SKIN CANCER, HX OF 11/12/2008    R cheek 2010, L Cheek 2011 Dr. Tonia Brooms  . Thyroid nodule   . Uterine cancer ([place])   . VENOUS INSUFFICIENCY 09/05/2007    Past Surgical History:  Procedure Laterality Date  . ABDOMINAL HYSTERECTOMY    . APPENDECTOMY  1972  . BREAST LUMPECTOMY Left 2001  . BREAST LUMPECTOMY WITH RADIOACTIVE  SEED LOCALIZATION Left 06/11/2018   Procedure: LEFT BREAST LUMPECTOMY WITH BRACKETED RADIOACTIVE SEED LOCALIZATION;  Surgeon: Fanny Skates, MD;  Location: [facility];  Service: General;  Laterality: Left;  . CARPAL TUNNEL RELEASE    . CATARACT EXTRACTION    . EYE SURGERY     cataract ext  . HERNIA REPAIR    . MASTECTOMY, PARTIAL Left   . SKIN CANCER EXCISION     face-Dr. Bing Plume  . TOTAL KNEE ARTHROPLASTY    . TOTAL KNEE ARTHROPLASTY Right 2010   Dr Para March  . TOTAL KNEE ARTHROPLASTY  10/30/2011   Procedure: TOTAL KNEE ARTHROPLASTY;  Surgeon: Lorn Junes, MD;  Location: [facility];  Service: Orthopedics;  Laterality: Left;     . XRT     chemo, surgery     Medications: Current Meds  Medication Sig  . ALPRAZolam (XANAX) 0.5  MG tablet Take 0.5 mg by mouth 2 (two) times daily as needed for anxiety or sleep.  Marland Kitchen apixaban (ELIQUIS) 2.5 MG TABS tablet Take 1 tablet (2.5 mg total) by mouth 2 (two) times daily.  . carvedilol (COREG) 3.125 MG tablet Take 1 tablet (3.125 mg total) by mouth 2 (two) times daily with a meal.  . coal tar (NEUTROGENA T-GEL) 0.5 % shampoo Apply 1 application topically once a week.   . diphenoxylate-atropine (LOMOTIL) 2.5-0.025 MG/5ML liquid Take by mouth 3 (three) times daily as needed for diarrhea or loose stools.  Marland Kitchen ketoconazole (NIZORAL) 2 % shampoo Apply 1 application topically 2 (two) times a week.  . loratadine (CLARITIN) 10 MG tablet Take 1 tablet (10 mg total) by mouth daily as needed for allergies.  . OXYGEN Inhale 1.5 L into the lungs at bedtime.  Vladimir Faster Glycol-Propyl Glycol (SYSTANE ULTRA PF OP) Place 2 drops into both eyes daily as needed (dry eyes).   . potassium chloride SA (K-DUR,KLOR-CON) 20 MEQ tablet Take 1 tablet (20 mEq total) by mouth daily.  Marland Kitchen torsemide (DEMADEX) 20 MG tablet Take 2 tablets (40 mg total) by mouth daily.  Marland Kitchen triamcinolone ointment (KENALOG) 0.1 % Apply 1 application topically 2 (two) times daily as needed (on affected area).   .  [DISCONTINUED] metolazone (ZAROXOLYN) 2.5 MG tablet Take 1 tablet (2.5 mg total) by mouth daily.     Allergies: Allergies  Allergen Reactions  . Cefuroxime Diarrhea  . Celecoxib Nausea Only  . Deltasone [Prednisone] Swelling    Oral deltasone is causing swelling (can use Depo-Medrol)  . Ranitidine Other (See Comments)    bloating  . Spironolactone Other (See Comments)    High K  . Tapazole [Methimazole] Rash  . Tramadol Hcl Nausea Only  . Entresto [Sacubitril-Valsartan] Other (See Comments)    Unknown reaction per pt    Social History: The patient  reports that she quit smoking about 37 years ago. She has never used smokeless tobacco. She reports previous alcohol use. She reports that she does not use drugs.   Family History: The patient's family history includes Alcohol abuse in her father; Alzheimer's disease in her mother; Bladder Cancer in her brother; Brain cancer (age of onset: 59) in her maternal uncle; Brain cancer (age of onset: 37) in her maternal aunt; Dementia in her mother; Diabetes in an other family member; Heart attack in her father, maternal grandfather, and maternal grandmother; Heart disease in her mother; Hypertension in her mother; Lung cancer in her paternal uncle; Lymphoma (age of onset: 10) in an other family member; Mental retardation in her mother; Prostate cancer in her brother; Skin cancer in an other family member.   Review of Systems: Please see the history of present illness.   Otherwise, the review of systems is positive for none.   All other systems are reviewed and negative.   Physical Exam: VS:  BP 90/60 (BP Location: Right Arm, Patient Position: Sitting, Cuff Size: Small)   Pulse 74   Ht 5\' 3"  (1.6 m)   Wt 115 lb 1.9 oz (52.2 kg)   SpO2 95% Comment: at rest  BMI 20.39 kg/m  .  BMI Body mass index is 20.39 kg/m.  Wt Readings from Last 3 Encounters:  10/22/18 115 lb 1.9 oz (52.2 kg)  10/16/18 127 lb 12.8 oz (58 kg)  10/08/18 120 lb 3.2 oz  (54.5 kg)    General:  Alert and in no acute distress. Her weight is basically where it  was in January.  Still looks chronically ill. Color is sallow. Eyes seem a little yellow.  HEENT: Normal.  Neck: Supple, no JVD, carotid bruits, or masses noted.  Cardiac: Irregular irregular rhythm.  Less edema - her legs are wrapped.  Respiratory:  Lungs are clear to auscultation bilaterally with normal work of breathing.  GI: Soft and nontender.  MS: No deformity or atrophy. Gait and ROM intact. Using a walker.  Skin: Warm and dry. Color is normal.  Neuro:  Strength and sensation are intact and no gross focal deficits noted.  Psych: Alert, appropriate and with normal affect.   LABORATORY DATA:  EKG:  EKG is not ordered today.  Lab Results  Component Value Date   WBC 6.4 10/14/2018   HGB 11.4 (L) 10/14/2018   HCT 37.6 10/14/2018   PLT 201 10/14/2018   GLUCOSE 95 10/14/2018   CHOL 130 11/04/2015   TRIG 77.0 11/04/2015   HDL 54.50 11/04/2015   LDLCALC 60 11/04/2015   ALT 58 (H) 09/16/2018   AST 75 (H) 09/16/2018   NA 139 10/14/2018   K 4.3 10/14/2018   CL 101 10/14/2018   CREATININE 0.99 10/14/2018   BUN 24 (H) 10/14/2018   CO2 33 (H) 10/14/2018   TSH 1.82 05/06/2018   INR 1.84 09/16/2018   HGBA1C 5.8 05/06/2018   MICROALBUR 0.6 03/23/2011     BNP (last 3 results) Recent Labs    06/18/18 1905  BNP 1,407.4*    ProBNP (last 3 results) Recent Labs    06/17/18 1544 07/23/18 1305 07/31/18 1118  PROBNP 1,573.0* 8,739* 7,046*     Other Studies Reviewed Today:  CT CHEST IMPRESSION 09/2018: 1. Large hiatal hernia, increased in size compared to the previous CT of 12/01/2006. 2. Small LEFT pleural effusion and associated mild atelectasis. 3. No evidence of pneumonia or pulmonary edema. 4. Central lobular emphysema bilaterally, upper lobe predominant, at least moderate in degree and similar to the previous chest CT. 5. Cardiomegaly. 6. LEFT thyroid lobe mass, with  retrosternal extension, stable to slightly increased in size compared to the previous CT of 12/01/2006, presumed multinodular goiter. Previous nuclear medicine thyroid scan was nondiagnostic.  Aortic Atherosclerosis (ICD10-I70.0) and Emphysema (ICD10-J43.9).   Electronically Signed By: Franki Cabot M.D. On: 10/14/2018 15:08    Echocardiogram 06/19/18: Study Conclusions - Left ventricle: The cavity size was normal. Wall thickness was normal. Systolic function was severely reduced. The estimated ejection fraction was in the range of 20% to 25%. Dyskinesis of the anteroseptal myocardium. - Mitral valve: There was mild regurgitation. - Left atrium: The atrium was mildly dilated. - Right ventricle: The cavity size was severely dilated. Systolic function was moderately reduced. - Right atrium: The atrium was severely dilated. - Tricuspid valve: There was moderate-severe regurgitation. - Pulmonary arteries: Systolic pressure was moderately increased. PA peak pressure: 58 mm Hg (S).    Assessment/Plan:  1.Chronic systolic HF - she has biventricular failure with valvular heart disease - complicated by CKD. Already deemed to not be a candidate for ACE/ARB/ARNI - no longer on Entresto due to diarrhea and will not be able to afford. BPremainssoft which limits medical therapy. Her overall prognosis remains quite poor. She has had good diuresis with Zaroxolyn. Need to recheck her lab today - may need this on a once or twice a week basis - will see what the lab shows. We have talked about end of life decisions today - she wishes to be made a DNR. She wants to speak with  Hospice and may agree to their services. She is asking about how much time she has left - I suspect maybe less than a year but reiterated that I do not have this crystal ball .  2. Permanent AF - on Eliquis. Rate is a little better. Not able to titrate her medicines due to soft BP.   3. CKD - stage  3 - followed by nephrology.Rechecking labs today.   4. Cellulitis - prior MRSA + culture - followed in the wound clinic.   5. COPD -chronic degree of shortness of breath.Now on oxygen - using at night.   6. Advanced age - options appear very limited to me - no real family support -we have discussed this again today in great depth. She wants to call [facility]. Will ask Hospice to see her as well. Very sad situation.   7. HTN -BP too soft.  8. Generalized weakness - most likely multifactorial - see above.   Current medicines are reviewed with the patient today.  The patient does not have concerns regarding medicines other than what has been noted above.  The following changes have been made:  See above.  Labs/ tests ordered today include:    Orders Placed This Encounter  Procedures  . Basic metabolic panel  . Pro b natriuretic peptide (BNP)     Disposition:   FU with me in about 2 weeks.  Overall prognosis tenuous at best.   Patient is agreeable to this plan and will call if any problems develop in the interim.   SignedTruitt Merle, NP  10/22/2018 12:35 PM  [facility] [address] [place], [place]  [address] Phone: [phone] Fax: [phone]

## 2018-10-22 NOTE — Telephone Encounter (Signed)
Copied from [place] [id number]. Topic: General - Other >> Oct 22, 2018  4:03 PM Yvette Rack wrote: Reason for CRM: Amy with [facility] calling to see if Dr. Alain Marion will be the attending provider for hospice. Cb# [phone]

## 2018-10-23 ENCOUNTER — Ambulatory Visit (INDEPENDENT_AMBULATORY_CARE_PROVIDER_SITE_OTHER): Payer: Medicare Other | Admitting: Internal Medicine

## 2018-10-23 ENCOUNTER — Encounter: Payer: Self-pay | Admitting: Internal Medicine

## 2018-10-23 VITALS — BP 110/62 | HR 68 | Temp 97.6°F | Ht 63.0 in | Wt 113.0 lb

## 2018-10-23 DIAGNOSIS — N183 Chronic kidney disease, stage 3 unspecified: Secondary | ICD-10-CM

## 2018-10-23 DIAGNOSIS — I5042 Chronic combined systolic (congestive) and diastolic (congestive) heart failure: Secondary | ICD-10-CM | POA: Diagnosis not present

## 2018-10-23 DIAGNOSIS — I5022 Chronic systolic (congestive) heart failure: Secondary | ICD-10-CM

## 2018-10-23 DIAGNOSIS — Z66 Do not resuscitate: Secondary | ICD-10-CM | POA: Insufficient documentation

## 2018-10-23 DIAGNOSIS — I4891 Unspecified atrial fibrillation: Secondary | ICD-10-CM

## 2018-10-23 MED ORDER — ALPRAZOLAM 0.5 MG PO TABS: 0.5000 mg | ORAL_TABLET | Freq: Two times a day (BID) | ORAL | 3 refills | Status: DC | PRN

## 2018-10-23 NOTE — Telephone Encounter (Signed)
Verbals given, FYI 

## 2018-10-23 NOTE — Assessment & Plan Note (Signed)
Eliquis 

## 2018-10-23 NOTE — Assessment & Plan Note (Addendum)
Confirmed DNR Hospice at home

## 2018-10-23 NOTE — Assessment & Plan Note (Addendum)
Coreg low dose Torsemide Low EF discussed Confirmed DNR Hospice at home

## 2018-10-23 NOTE — Telephone Encounter (Signed)
Notified that Dr. Alain Marion will be attending provider

## 2018-10-23 NOTE — Assessment & Plan Note (Signed)
Confirmed DNR Hospice at home

## 2018-10-23 NOTE — Progress Notes (Signed)
Subjective:  Patient ID: Mia Contreras, female    DOB: Jan 14, 1929  Age: 85 y.o. MRN: [id number]  CC: No chief complaint on file.   HPI [facility] R[facility] [place] presents for post-hosp stay for cellulitis.  Recent hospitalization record was reviewed.  She is status post IV antibiotics.  She is seeing Dr. Dellia Nims at the wound clinic [facility]). F/u CHF - worse over past 6 months F/u A fib A complex case involving her CHF discussion and the end-of-life care  Outpatient Medications Prior to Visit  Medication Sig Dispense Refill  . ALPRAZolam (XANAX) 0.5 MG tablet Take 0.5 mg by mouth 2 (two) times daily as needed for anxiety or sleep.    Marland Kitchen apixaban (ELIQUIS) 2.5 MG TABS tablet Take 1 tablet (2.5 mg total) by mouth 2 (two) times daily. 60 tablet 11  . carvedilol (COREG) 3.125 MG tablet Take 1 tablet (3.125 mg total) by mouth 2 (two) times daily with a meal. 60 tablet 11  . coal tar (NEUTROGENA T-GEL) 0.5 % shampoo Apply 1 application topically once a week.     . diphenoxylate-atropine (LOMOTIL) 2.5-0.025 MG/5ML liquid Take by mouth 3 (three) times daily as needed for diarrhea or loose stools.    Marland Kitchen ketoconazole (NIZORAL) 2 % shampoo Apply 1 application topically 2 (two) times a week.    . loratadine (CLARITIN) 10 MG tablet Take 1 tablet (10 mg total) by mouth daily as needed for allergies. 30 tablet 0  . OXYGEN Inhale 1.5 L into the lungs at bedtime.    Vladimir Faster Glycol-Propyl Glycol (SYSTANE ULTRA PF OP) Place 2 drops into both eyes daily as needed (dry eyes).     . potassium chloride SA (K-DUR,KLOR-CON) 20 MEQ tablet Take 1 tablet (20 mEq total) by mouth daily. 90 tablet 3  . torsemide (DEMADEX) 20 MG tablet Take 2 tablets (40 mg total) by mouth daily. 180 tablet 3  . triamcinolone ointment (KENALOG) 0.1 % Apply 1 application topically 2 (two) times daily as needed (on affected area).      No facility-administered medications prior to visit.     ROS: Review of Systems  Constitutional:  Positive for fatigue. Negative for activity change, appetite change, chills and unexpected weight change.  HENT: Negative for congestion, mouth sores and sinus pressure.   Eyes: Negative for visual disturbance.  Respiratory: Negative for cough and chest tightness.   Cardiovascular: Positive for leg swelling.  Gastrointestinal: Negative for abdominal pain and nausea.  Genitourinary: Negative for difficulty urinating, frequency and vaginal pain.  Musculoskeletal: Positive for arthralgias, back pain and gait problem.  Skin: Positive for wound. Negative for pallor and rash.  Neurological: Positive for weakness. Negative for dizziness, tremors, numbness and headaches.  Psychiatric/Behavioral: Negative for confusion, sleep disturbance and suicidal ideas. The patient is nervous/anxious.     Objective:  BP 110/62 (BP Location: Left Arm, Patient Position: Sitting, Cuff Size: Normal)   Pulse 68   Temp 97.6 F (36.4 C) (Oral)   Ht 5\' 3"  (1.6 m)   Wt 113 lb (51.3 kg)   SpO2 95%   BMI 20.02 kg/m   BP Readings from Last 3 Encounters:  10/23/18 110/62  10/22/18 90/60  10/16/18 (!) 96/58    Wt Readings from Last 3 Encounters:  10/23/18 113 lb (51.3 kg)  10/22/18 115 lb 1.9 oz (52.2 kg)  10/16/18 127 lb 12.8 oz (58 kg)    Physical Exam Constitutional:      General: She is not in acute distress.  Appearance: She is well-developed.  HENT:     Head: Normocephalic.     Right Ear: External ear normal.     Left Ear: External ear normal.     Nose: Nose normal.  Eyes:     General:        Right eye: No discharge.        Left eye: No discharge.     Conjunctiva/sclera: Conjunctivae normal.     Pupils: Pupils are equal, round, and reactive to light.  Neck:     Musculoskeletal: Normal range of motion and neck supple.     Thyroid: No thyromegaly.     Vascular: No JVD.     Trachea: No tracheal deviation.  Cardiovascular:     Rate and Rhythm: Normal rate and regular rhythm.     Heart  sounds: Normal heart sounds.  Pulmonary:     Effort: No respiratory distress.     Breath sounds: No stridor. No wheezing.  Abdominal:     General: Bowel sounds are normal. There is no distension.     Palpations: Abdomen is soft. There is no mass.     Tenderness: There is no abdominal tenderness. There is no guarding or rebound.  Musculoskeletal:        General: No tenderness.  Lymphadenopathy:     Cervical: No cervical adenopathy.  Skin:    Findings: No erythema or rash.  Neurological:     Mental Status: She is oriented to person, place, and time.     Cranial Nerves: No cranial nerve deficit.     Motor: No abnormal muscle tone.     Coordination: Coordination abnormal.     Deep Tendon Reflexes: Reflexes normal.  Psychiatric:        Behavior: Behavior normal.        Thought Content: Thought content normal.        Judgment: Judgment normal.    Walker Feet and lower legs are wrapped   Lab Results  Component Value Date   WBC 6.4 10/14/2018   HGB 11.4 (L) 10/14/2018   HCT 37.6 10/14/2018   PLT 201 10/14/2018   GLUCOSE 78 10/22/2018   CHOL 130 11/04/2015   TRIG 77.0 11/04/2015   HDL 54.50 11/04/2015   LDLCALC 60 11/04/2015   ALT 58 (H) 09/16/2018   AST 75 (H) 09/16/2018   NA 141 10/22/2018   K 3.2 (L) 10/22/2018   CL 81 (L) 10/22/2018   CREATININE 1.39 (H) 10/22/2018   BUN 44 (H) 10/22/2018   CO2 39 (H) 10/22/2018   TSH 1.82 05/06/2018   INR 1.84 09/16/2018   HGBA1C 5.8 05/06/2018   MICROALBUR 0.6 03/23/2011    Dg Chest 2 View  Result Date: 10/08/2018 CLINICAL DATA:  Hypoxia EXAM: CHEST - 2 VIEW COMPARISON:  09/16/2018, 06/18/2018, 06/06/2016, ultrasound 09/05/2013 FINDINGS: Cardiomegaly. No pleural effusion. Moderate hiatal hernia. Aortic atherosclerosis. Left paratracheal opacity, likely corresponding to enlarged thyroid on prior ultrasound IMPRESSION: No active cardiopulmonary disease. Cardiomegaly. Large hiatal hernia Electronically Signed   By: Donavan Foil  M.D.   On: 10/08/2018 22:49    Assessment & Plan:   There are no diagnoses linked to this encounter.   No orders of the defined types were placed in this encounter.    Follow-up: No follow-ups on file.  Walker Kehr, MD

## 2018-10-23 NOTE — Assessment & Plan Note (Signed)
St $ Labs discussed

## 2018-10-23 NOTE — Patient Instructions (Addendum)
   [employer]  4.1 [phone]  $$  Shoe store  [place], [place]  In [facility]  Open ? Closes 9PM  [phone]  Brand-name footwear & accessories   [employer]  4.3 [phone]  $$  Shoe store  [place]  In [facility]  Open ? Closes 9PM  [phone]  Discounted brand-name shoes & handbags

## 2018-10-24 ENCOUNTER — Other Ambulatory Visit: Payer: Self-pay | Admitting: *Deleted

## 2018-10-24 DIAGNOSIS — Z4800 Encounter for change or removal of nonsurgical wound dressing: Secondary | ICD-10-CM | POA: Diagnosis not present

## 2018-10-24 DIAGNOSIS — I5023 Acute on chronic systolic (congestive) heart failure: Secondary | ICD-10-CM | POA: Diagnosis not present

## 2018-10-24 DIAGNOSIS — I87311 Chronic venous hypertension (idiopathic) with ulcer of right lower extremity: Secondary | ICD-10-CM | POA: Diagnosis not present

## 2018-10-24 DIAGNOSIS — A4902 Methicillin resistant Staphylococcus aureus infection, unspecified site: Secondary | ICD-10-CM | POA: Diagnosis not present

## 2018-10-24 DIAGNOSIS — L97211 Non-pressure chronic ulcer of right calf limited to breakdown of skin: Secondary | ICD-10-CM | POA: Diagnosis not present

## 2018-10-24 DIAGNOSIS — I5082 Biventricular heart failure: Secondary | ICD-10-CM | POA: Diagnosis not present

## 2018-10-24 DIAGNOSIS — Z7901 Long term (current) use of anticoagulants: Secondary | ICD-10-CM | POA: Diagnosis not present

## 2018-10-24 DIAGNOSIS — Z853 Personal history of malignant neoplasm of breast: Secondary | ICD-10-CM | POA: Diagnosis not present

## 2018-10-24 DIAGNOSIS — I482 Chronic atrial fibrillation, unspecified: Secondary | ICD-10-CM | POA: Diagnosis not present

## 2018-10-24 DIAGNOSIS — S81812D Laceration without foreign body, left lower leg, subsequent encounter: Secondary | ICD-10-CM | POA: Diagnosis not present

## 2018-10-24 DIAGNOSIS — I890 Lymphedema, not elsewhere classified: Secondary | ICD-10-CM | POA: Diagnosis not present

## 2018-10-24 DIAGNOSIS — L03116 Cellulitis of left lower limb: Secondary | ICD-10-CM | POA: Diagnosis not present

## 2018-10-24 DIAGNOSIS — J432 Centrilobular emphysema: Secondary | ICD-10-CM | POA: Diagnosis not present

## 2018-10-24 MED ORDER — POTASSIUM CHLORIDE CRYS ER 20 MEQ PO TBCR: 40.0000 meq | EXTENDED_RELEASE_TABLET | Freq: Every day | ORAL | 3 refills | Status: DC

## 2018-10-25 ENCOUNTER — Telehealth: Payer: Self-pay | Admitting: Internal Medicine

## 2018-10-25 DIAGNOSIS — A4902 Methicillin resistant Staphylococcus aureus infection, unspecified site: Secondary | ICD-10-CM | POA: Diagnosis not present

## 2018-10-25 DIAGNOSIS — L03116 Cellulitis of left lower limb: Secondary | ICD-10-CM | POA: Diagnosis not present

## 2018-10-25 DIAGNOSIS — I890 Lymphedema, not elsewhere classified: Secondary | ICD-10-CM | POA: Diagnosis not present

## 2018-10-25 DIAGNOSIS — L97211 Non-pressure chronic ulcer of right calf limited to breakdown of skin: Secondary | ICD-10-CM | POA: Diagnosis not present

## 2018-10-25 DIAGNOSIS — I87311 Chronic venous hypertension (idiopathic) with ulcer of right lower extremity: Secondary | ICD-10-CM | POA: Diagnosis not present

## 2018-10-25 DIAGNOSIS — S81812D Laceration without foreign body, left lower leg, subsequent encounter: Secondary | ICD-10-CM | POA: Diagnosis not present

## 2018-10-25 NOTE — Telephone Encounter (Signed)
Copied from [place] [id number]. Topic: Quick Communication - See Telephone Encounter >> Oct 25, 2018  4:21 PM [name] wrote: CRM for notification. See Telephone encounter for: 10/25/18. Mia Contreras calling from hospice and and stated that pt has declined hospice care. Please advise

## 2018-10-28 DIAGNOSIS — L97211 Non-pressure chronic ulcer of right calf limited to breakdown of skin: Secondary | ICD-10-CM | POA: Diagnosis not present

## 2018-10-28 DIAGNOSIS — A4902 Methicillin resistant Staphylococcus aureus infection, unspecified site: Secondary | ICD-10-CM | POA: Diagnosis not present

## 2018-10-28 DIAGNOSIS — L03116 Cellulitis of left lower limb: Secondary | ICD-10-CM | POA: Diagnosis not present

## 2018-10-28 DIAGNOSIS — I890 Lymphedema, not elsewhere classified: Secondary | ICD-10-CM | POA: Diagnosis not present

## 2018-10-28 DIAGNOSIS — I87311 Chronic venous hypertension (idiopathic) with ulcer of right lower extremity: Secondary | ICD-10-CM | POA: Diagnosis not present

## 2018-10-28 DIAGNOSIS — S81812D Laceration without foreign body, left lower leg, subsequent encounter: Secondary | ICD-10-CM | POA: Diagnosis not present

## 2018-10-28 NOTE — Telephone Encounter (Signed)
FYI

## 2018-10-29 DIAGNOSIS — I87331 Chronic venous hypertension (idiopathic) with ulcer and inflammation of right lower extremity: Secondary | ICD-10-CM | POA: Diagnosis not present

## 2018-10-29 DIAGNOSIS — L97212 Non-pressure chronic ulcer of right calf with fat layer exposed: Secondary | ICD-10-CM | POA: Diagnosis not present

## 2018-10-29 DIAGNOSIS — I110 Hypertensive heart disease with heart failure: Secondary | ICD-10-CM | POA: Diagnosis not present

## 2018-10-29 DIAGNOSIS — L97822 Non-pressure chronic ulcer of other part of left lower leg with fat layer exposed: Secondary | ICD-10-CM | POA: Diagnosis not present

## 2018-10-29 DIAGNOSIS — I87313 Chronic venous hypertension (idiopathic) with ulcer of bilateral lower extremity: Secondary | ICD-10-CM | POA: Diagnosis not present

## 2018-10-29 DIAGNOSIS — L97812 Non-pressure chronic ulcer of other part of right lower leg with fat layer exposed: Secondary | ICD-10-CM | POA: Diagnosis not present

## 2018-10-29 DIAGNOSIS — J449 Chronic obstructive pulmonary disease, unspecified: Secondary | ICD-10-CM | POA: Diagnosis not present

## 2018-10-29 DIAGNOSIS — L97222 Non-pressure chronic ulcer of left calf with fat layer exposed: Secondary | ICD-10-CM | POA: Diagnosis not present

## 2018-10-30 NOTE — Telephone Encounter (Signed)
Noted. Thx.

## 2018-11-01 DIAGNOSIS — S81812D Laceration without foreign body, left lower leg, subsequent encounter: Secondary | ICD-10-CM | POA: Diagnosis not present

## 2018-11-01 DIAGNOSIS — L03116 Cellulitis of left lower limb: Secondary | ICD-10-CM | POA: Diagnosis not present

## 2018-11-01 DIAGNOSIS — I87311 Chronic venous hypertension (idiopathic) with ulcer of right lower extremity: Secondary | ICD-10-CM | POA: Diagnosis not present

## 2018-11-01 DIAGNOSIS — A4902 Methicillin resistant Staphylococcus aureus infection, unspecified site: Secondary | ICD-10-CM | POA: Diagnosis not present

## 2018-11-01 DIAGNOSIS — I890 Lymphedema, not elsewhere classified: Secondary | ICD-10-CM | POA: Diagnosis not present

## 2018-11-01 DIAGNOSIS — L97211 Non-pressure chronic ulcer of right calf limited to breakdown of skin: Secondary | ICD-10-CM | POA: Diagnosis not present

## 2018-11-04 ENCOUNTER — Ambulatory Visit: Payer: Medicare Other | Admitting: Internal Medicine

## 2018-11-04 DIAGNOSIS — I110 Hypertensive heart disease with heart failure: Secondary | ICD-10-CM | POA: Diagnosis not present

## 2018-11-04 DIAGNOSIS — I87331 Chronic venous hypertension (idiopathic) with ulcer and inflammation of right lower extremity: Secondary | ICD-10-CM | POA: Diagnosis not present

## 2018-11-04 DIAGNOSIS — L97222 Non-pressure chronic ulcer of left calf with fat layer exposed: Secondary | ICD-10-CM | POA: Diagnosis not present

## 2018-11-04 DIAGNOSIS — L97822 Non-pressure chronic ulcer of other part of left lower leg with fat layer exposed: Secondary | ICD-10-CM | POA: Diagnosis not present

## 2018-11-04 DIAGNOSIS — L97812 Non-pressure chronic ulcer of other part of right lower leg with fat layer exposed: Secondary | ICD-10-CM | POA: Diagnosis not present

## 2018-11-04 DIAGNOSIS — J449 Chronic obstructive pulmonary disease, unspecified: Secondary | ICD-10-CM | POA: Diagnosis not present

## 2018-11-06 ENCOUNTER — Encounter: Payer: Self-pay | Admitting: Nurse Practitioner

## 2018-11-06 ENCOUNTER — Ambulatory Visit (INDEPENDENT_AMBULATORY_CARE_PROVIDER_SITE_OTHER): Payer: Medicare Other | Admitting: Nurse Practitioner

## 2018-11-06 ENCOUNTER — Ambulatory Visit: Payer: Medicare Other | Admitting: Nurse Practitioner

## 2018-11-06 ENCOUNTER — Other Ambulatory Visit: Payer: Self-pay

## 2018-11-06 VITALS — BP 130/90 | HR 100 | Ht 63.0 in | Wt 118.1 lb

## 2018-11-06 DIAGNOSIS — I87311 Chronic venous hypertension (idiopathic) with ulcer of right lower extremity: Secondary | ICD-10-CM | POA: Diagnosis not present

## 2018-11-06 DIAGNOSIS — A4902 Methicillin resistant Staphylococcus aureus infection, unspecified site: Secondary | ICD-10-CM | POA: Diagnosis not present

## 2018-11-06 DIAGNOSIS — L97211 Non-pressure chronic ulcer of right calf limited to breakdown of skin: Secondary | ICD-10-CM | POA: Diagnosis not present

## 2018-11-06 DIAGNOSIS — I5022 Chronic systolic (congestive) heart failure: Secondary | ICD-10-CM

## 2018-11-06 DIAGNOSIS — I890 Lymphedema, not elsewhere classified: Secondary | ICD-10-CM | POA: Diagnosis not present

## 2018-11-06 DIAGNOSIS — S81812D Laceration without foreign body, left lower leg, subsequent encounter: Secondary | ICD-10-CM | POA: Diagnosis not present

## 2018-11-06 DIAGNOSIS — L03116 Cellulitis of left lower limb: Secondary | ICD-10-CM | POA: Diagnosis not present

## 2018-11-06 LAB — BASIC METABOLIC PANEL
BUN/Creatinine Ratio: 23 (ref 12–28)
BUN: 29 mg/dL — ABNORMAL HIGH (ref 8–27)
CO2: 25 mmol/L (ref 20–29)
Calcium: 8.8 mg/dL (ref 8.7–10.3)
Chloride: 97 mmol/L (ref 96–106)
Creatinine, Ser: 1.25 mg/dL — ABNORMAL HIGH (ref 0.57–1.00)
GFR calc Af Amer: 45 mL/min/{1.73_m2} — ABNORMAL LOW (ref >59)
GFR calc non Af Amer: 39 mL/min/{1.73_m2} — ABNORMAL LOW (ref >59)
Glucose: 83 mg/dL (ref 65–99)
Potassium: 5.4 mmol/L — ABNORMAL HIGH (ref 3.5–5.2)
Sodium: 138 mmol/L (ref 134–144)

## 2018-11-06 MED ORDER — POTASSIUM CHLORIDE CRYS ER 20 MEQ PO TBCR: 40.0000 meq | EXTENDED_RELEASE_TABLET | Freq: Every day | ORAL | 3 refills | Status: DC

## 2018-11-06 MED ORDER — CARVEDILOL 6.25 MG PO TABS: 6.2500 mg | ORAL_TABLET | Freq: Two times a day (BID) | ORAL | 3 refills | Status: DC

## 2018-11-06 NOTE — Telephone Encounter (Signed)
Pt called in and pharmacy never received potassium.

## 2018-11-06 NOTE — Progress Notes (Signed)
CARDIOLOGY OFFICE NOTE  Date:  11/06/2018    Mia Contreras Date of Birth: Jul 18, 1929 Medical Record #[id number]  PCP:  Cassandria Anger, MD  Cardiologist:  Gillian Shields    Chief Complaint  Patient presents with   Congestive Heart Failure    FU visit - seen for Dr. Johnsie Cancel    History of Present Illness: Mia Contreras is a 85 y.o. female who presents today for a follow up visit. Seen for Dr. Johnsie Cancel.   She has a history of NICM with EF of45-50% onpriorecho 2017 (previously 30-35% in 2016),hypertension, hyperlipidemia, CKD stage III, breast cancer and persistent atrial fibrillation on apixaban.  Shewas admitted to the hospital 06/18/2018 for evaluation of weight gain and lower extremity edema despite extra torsemide at home. She had had a recurrence of stage I breast cancer and underwent lumpectomy 06/11/2018 and following her surgery peoplewerebringing food over to her home. She may have been eating more salt than she usually does. BNP was 1407 and chest x-ray showed mild vascular congestion.Echo on 06/19/2018 showed EF down to 20-25%with dyskinesis of the anteroseptal myocardium.She had no chest pain and does haveCKD (sees Dr. Myrla Halsted she was not felt to be a candidate for cardiac catheterization to evaluate her reduced EF. She was diuresed with IV Lasix with improvement,weight loss of about 10 pounds and discharged home on her usual torsemide.Shewas deemed toalso notbea candidate for the addition of ACE inhibitor, ARB or ARNI.Noted to basically have biventricular failure and complicated by her severe CKD.Blood pressure was low in the 82U-235 systolic, therefore she would not be able to tolerate further afterload reduction.It was the patient's opinion thatthe IV NS fluids given with surgery caused her volume overload.  She was seen for a post hospital visit by Marcy Salvo, NP- she was improvedat that time.She did have a wound on her  leg. Had a few phone calls since - had weight gain - had extra salt - had diarrhea and continued issues with her leg wound.I then saw hera few times this past December of 2019-very confused about her condition/health. Seemed mixed up on her medicines to me. Lots of swelling.Weight was up.Poor social situation with no real support/no family/lives alone. Deemed to not be a candidate for ACE/ARB/ARNI but was on Entresto - potassium got high - this was stopped.She was not going to be able to afford and had lots of diarrhea with it as well.She had lots of swelling on exam and I gave her a 3 day course of Zaroxolyn. I started an end of life conversation as well.She was improved onlastfollow up with me.   Last seen by Dr. Johnsie Cancel in January - was felt to be doing well. Limited walking due to significant scoliosis. She continues to be followed in the wound clinic. She has had MRSA cultured.   Admitted in February with cellulitis that failed on outpatient therapy - she required 7 days of IV Vancomycin. Also quite hypoxic - had CT scan. Remains on Eliquis. Stay was complicated by generalized weakness. She was placed on oxygen.I then saw her on follow up - she was doing poorly - did not come with her oxygen and remained hypoxic. Weight was way back up again - ended up putting her on a short course of Zaroxolyn. Very poor insight into the seriousness of her issues. Social situation very poor. She still has a mortgage on her house - no savings. Unclear if she will be able to get any assistance. She was  asking about Hospice services and the self pay services ([facility] [place] [place] [facility]). She is asking about DNR -  she has a living will. Did not wish to have CPR or be intubated. Said she just "lives one day at a time" - admits she has not given any thought to the future. Her brother in [place] is her POA. He is in an assisted living facility there. He actually sends her money.   I signed a DNR for her at last  visit. Hospice was called to evaluate as well.   Patient screened for recent travel, fever, URI symptoms and shortness of breath. Patient denies travel over the last 14 days and is currently without symptoms.   Comes in today. Here alone. She has multiple complaints today. She has seen multiple providers. Her eyes are itchy and runny. She is coughing. She has had a cold for a month. Her legs hurt with the dressings on her legs. Her cell phone is dead. She is suppose to see Dr. Dalbert Batman this Friday for her breast cancer follow up. She says she is prepared to stay in. She has food. She is not able to drive due to her legs. She is having issues sleeping due to her legs and the dressings in place.  Her breathing is "ok". Sounds like she is drinking lots of fluids. Her weight is up. She declined Hospice - said she did not like what they said - felt "pushy". She has "no plan B". She is more concerned about her legs and getting the Corona virus.   Past Medical History:  Diagnosis Date   AKI (acute kidney injury) ([place]) 01/2016   ANEMIA-NOS 08/20/2007   ANXIETY 03/16/2007   BREAST CANCER, HX OF 03/16/2007   CARPAL TUNNEL SYNDROME, RIGHT 01/11/2008   Cataract    floaters   CELLULITIS, LEG, RIGHT 08/20/2007   CHF (congestive heart failure) ([facility])    Collagenous colitis    Congestive heart failure with right ventricular systolic dysfunction ([place]) 06/19/2018   COPD (chronic obstructive pulmonary disease) ([place])    DIVERTICULOSIS, COLON 08/20/2007   Dyspnea    on exertion   Dysrhythmia    atrial fib   Family history of brain tumor    Family history of lung cancer    Family history of lymphoma    Family history of prostate cancer    Family history of skin cancer    GOITER, MULTINODULAR 05/06/2010   Dr [name]   HYPERLIPIDEMIA 08/20/2007   HYPERTENSION 03/16/2007   HYPERTHYROIDISM 05/23/2010   IBS (irritable bowel syndrome)    OSTEOARTHRITIS 03/16/2007   OSTEOPOROSIS 08/20/2007     Personal history of breast cancer 07/01/2018   Pneumonia 07/2011   took abx for several weeks   PREMATURE ATRIAL CONTRACTIONS 09/21/2010   SCOLIOSIS 01/11/2008   Severe tricuspid regurgitation 06/19/2018   SKIN CANCER, HX OF 11/12/2008    R cheek 2010, L Cheek 2011 Dr. Tonia Brooms   Thyroid nodule    Uterine cancer [facility])    VENOUS INSUFFICIENCY 09/05/2007    Past Surgical History:  Procedure Laterality Date   ABDOMINAL HYSTERECTOMY     APPENDECTOMY  1972   BREAST LUMPECTOMY Left 2001   BREAST LUMPECTOMY WITH RADIOACTIVE SEED LOCALIZATION Left 06/11/2018   Procedure: LEFT BREAST LUMPECTOMY WITH BRACKETED RADIOACTIVE SEED LOCALIZATION;  Surgeon: Fanny Skates, MD;  Location: [facility];  Service: General;  Laterality: Left;   CARPAL TUNNEL RELEASE     CATARACT EXTRACTION     EYE SURGERY  cataract ext   HERNIA REPAIR     MASTECTOMY, PARTIAL Left    SKIN CANCER EXCISION     face-Dr. Bing Plume   TOTAL KNEE ARTHROPLASTY     TOTAL KNEE ARTHROPLASTY Right 2010   Dr Para March   TOTAL KNEE ARTHROPLASTY  10/30/2011   Procedure: TOTAL KNEE ARTHROPLASTY;  Surgeon: Lorn Junes, MD;  Location: [facility];  Service: Orthopedics;  Laterality: Left;      XRT     chemo, surgery     Medications: Current Meds  Medication Sig   ALPRAZolam (XANAX) 0.5 MG tablet Take 1 tablet (0.5 mg total) by mouth 2 (two) times daily as needed for anxiety or sleep.   apixaban (ELIQUIS) 2.5 MG TABS tablet Take 1 tablet (2.5 mg total) by mouth 2 (two) times daily.   coal tar (NEUTROGENA T-GEL) 0.5 % shampoo Apply 1 application topically once a week.    diphenoxylate-atropine (LOMOTIL) 2.5-0.025 MG/5ML liquid Take by mouth 3 (three) times daily as needed for diarrhea or loose stools.   ketoconazole (NIZORAL) 2 % shampoo Apply 1 application topically 2 (two) times a week.   loratadine (CLARITIN) 10 MG tablet Take 1 tablet (10 mg total) by mouth daily as needed for allergies.   OXYGEN Inhale 1.5 L  into the lungs at bedtime.   Polyethyl Glycol-Propyl Glycol (SYSTANE ULTRA PF OP) Place 2 drops into both eyes daily as needed (dry eyes).    potassium chloride SA (K-DUR,KLOR-CON) 20 MEQ tablet Take 2 tablets (40 mEq total) by mouth daily.   torsemide (DEMADEX) 20 MG tablet Take 2 tablets (40 mg total) by mouth daily.   triamcinolone ointment (KENALOG) 0.1 % Apply 1 application topically 2 (two) times daily as needed (on affected area).    [DISCONTINUED] carvedilol (COREG) 3.125 MG tablet Take 1 tablet (3.125 mg total) by mouth 2 (two) times daily with a meal.   [DISCONTINUED] potassium chloride SA (K-DUR,KLOR-CON) 20 MEQ tablet Take 2 tablets (40 mEq total) by mouth daily.     Allergies: Allergies  Allergen Reactions   Cefuroxime Diarrhea   Celecoxib Nausea Only   Deltasone [Prednisone] Swelling    Oral deltasone is causing swelling (can use Depo-Medrol)   Ranitidine Other (See Comments)    bloating   Spironolactone Other (See Comments)    High K   Tapazole [Methimazole] Rash   Tramadol Hcl Nausea Only   Entresto [Sacubitril-Valsartan] Other (See Comments)    Unknown reaction per pt    Social History: The patient  reports that she quit smoking about 37 years ago. She has never used smokeless tobacco. She reports previous alcohol use. She reports that she does not use drugs.   Family History: The patient's family history includes Alcohol abuse in her father; Alzheimer's disease in her mother; Bladder Cancer in her brother; Brain cancer (age of onset: 30) in her maternal uncle; Brain cancer (age of onset: 35) in her maternal aunt; Dementia in her mother; Diabetes in an other family member; Heart attack in her father, maternal grandfather, and maternal grandmother; Heart disease in her mother; Hypertension in her mother; Lung cancer in her paternal uncle; Lymphoma (age of onset: 61) in an other family member; Mental retardation in her mother; Prostate cancer in her  brother; Skin cancer in an other family member.   Review of Systems: Please see the history of present illness.   Otherwise, the review of systems is positive for none.   All other systems are reviewed and negative.  Physical Exam: VS:  BP 130/90 (BP Location: Right Arm, Patient Position: Sitting, Cuff Size: Small)    Pulse 100 Comment: -125 irratic   Ht 5\' 3"  (1.6 m)    Wt 118 lb 1.9 oz (53.6 kg)    SpO2 90% Comment: at rest 98 on left after rest   BMI 20.92 kg/m  .  BMI Body mass index is 20.92 kg/m.  Wt Readings from Last 3 Encounters:  11/06/18 118 lb 1.9 oz (53.6 kg)  10/23/18 113 lb (51.3 kg)  10/22/18 115 lb 1.9 oz (52.2 kg)    General: Alert. Elderly. She is in no acute distress.  She looks better today clinically. Her weight is up a few pounds. But seems to have less swelling in her legs noted.  HEENT: Normal.  Neck: Supple, no JVD, carotid bruits, or masses noted.  Cardiac: Irregular irregular rhythm. Rate is fast.  No murmurs, rubs, or gallops. Less edema. She has dressings in place to both legs.  Respiratory:  Lungs are clear to auscultation bilaterally with normal work of breathing.  GI: Soft and nontender.  MS: No deformity or atrophy. Gait and ROM intact. She is using a walker.  Skin: Warm and dry. Color is normal.  Neuro:  Strength and sensation are intact and no gross focal deficits noted.  Psych: Alert, appropriate and with normal affect.   LABORATORY DATA:  EKG:  EKG is not ordered today.  Lab Results  Component Value Date   WBC 6.4 10/14/2018   HGB 11.4 (L) 10/14/2018   HCT 37.6 10/14/2018   PLT 201 10/14/2018   GLUCOSE 78 10/22/2018   CHOL 130 11/04/2015   TRIG 77.0 11/04/2015   HDL 54.50 11/04/2015   LDLCALC 60 11/04/2015   ALT 58 (H) 09/16/2018   AST 75 (H) 09/16/2018   NA 141 10/22/2018   K 3.2 (L) 10/22/2018   CL 81 (L) 10/22/2018   CREATININE 1.39 (H) 10/22/2018   BUN 44 (H) 10/22/2018   CO2 39 (H) 10/22/2018   TSH 1.82 05/06/2018   INR  1.84 09/16/2018   HGBA1C 5.8 05/06/2018   MICROALBUR 0.6 03/23/2011     BNP (last 3 results) Recent Labs    06/18/18 1905  BNP 1,407.4*    ProBNP (last 3 results) Recent Labs    07/23/18 1305 07/31/18 1118 10/22/18 1140  PROBNP 8,739* 7,046* 5,759*     Other Studies Reviewed Today:  CT CHESTIMPRESSION2/2020: 1. Large hiatal hernia, increased in size compared to the previous CT of 12/01/2006. 2. Small LEFT pleural effusion and associated mild atelectasis. 3. No evidence of pneumonia or pulmonary edema. 4. Central lobular emphysema bilaterally, upper lobe predominant, at least moderate in degree and similar to the previous chest CT. 5. Cardiomegaly. 6. LEFT thyroid lobe mass, with retrosternal extension, stable to slightly increased in size compared to the previous CT of 12/01/2006, presumed multinodular goiter. Previous nuclear medicine thyroid scan was nondiagnostic.  Aortic Atherosclerosis (ICD10-I70.0) and Emphysema (ICD10-J43.9).   Electronically Signed By: Franki Cabot M.D. On: 10/14/2018 15:08    Echocardiogram 06/19/18: Study Conclusions - Left ventricle: The cavity size was normal. Wall thickness was normal. Systolic function was severely reduced. The estimated ejection fraction was in the range of 20% to 25%. Dyskinesis of the anteroseptal myocardium. - Mitral valve: There was mild regurgitation. - Left atrium: The atrium was mildly dilated. - Right ventricle: The cavity size was severely dilated. Systolic function was moderately reduced. - Right atrium: The atrium was severely dilated. -  Tricuspid valve: There was moderate-severe regurgitation. - Pulmonary arteries: Systolic pressure was moderately increased. PA peak pressure: 58 mm Hg (S).    Assessment/Plan:  1.Chronic systolic HF - she hasbiventricular failure with valvular heart disease - complicated by CKD. Already deemed to not be a candidate for  ACE/ARB/ARNI - no longer on Entresto due to diarrhea and will not be able to afford. BPbetter today - will try to increase her Coreg - in the past her BP has limited the titration of her medicines. Her overall prognosis remains quite poor. She has declined Hospice services. Overall situation is very poor. She did not wish to discuss any other planning at this time.   2. Permanent AF- on Eliquis. Rate is fast - BP better - will try to increase the Coreg today.   3. CKD- stage 3 - followed by nephrology.Recheckinglabs today.   4. Cellulitis - prior MRSA + culture - followed in the wound clinic.   5. COPD-chronic degree of shortness of breath.Now on oxygen - using at night. Oxygen sat is better today.   6. Advanced age- options appear verylimited to me - no real family support -she did not wish to discuss this today at all - I think there is also some degree of confusion on her part - she declines any further assistance.   7. HTN-Trying to increase Coreg today.   8. Generalized weakness- most likely multifactorial - see above.  9. Prior breast cancer - will get her visit with Dr. Dalbert Batman changed  Current medicines are reviewed with the patient today.  The patient does not have concerns regarding medicines other than what has been noted above.  The following changes have been made:  See above.  Labs/ tests ordered today include:    Orders Placed This Encounter  Procedures   Basic metabolic panel     Disposition:   FU with me in 6 weeks.   Patient is agreeable to this plan and will call if any problems develop in the interim.   SignedTruitt Merle, NP  11/06/2018 11:48 AM  [facility] [address] [place], [place]  [address] Phone: [phone] Fax: [phone]

## 2018-11-06 NOTE — Patient Instructions (Addendum)
We will be checking the following labs today - BMET    Medication Instructions:    Continue with your current medicines. BUT  I have refilled the potassium - this is at your pharmacy  I am increasing the Coreg to 6.25 mg twice a day - this is at your pharmacy - start this  tomorrow. This is to help slow your heart rate down.    If you need a refill on your cardiac medications before your next appointment, please call your pharmacy.     Testing/Procedures To Be Arranged:  N/A  Follow-Up:   See me in about 6 weeks.   I have called Dr. Dalbert Batman - your appointment with him has been changed to Monday, April 27th at 4:30 PM    At [facility], you and your health needs are our priority.  As part of our continuing mission to provide you with exceptional heart care, we have created designated Provider Care Teams.  These Care Teams include your primary Cardiologist (physician) and Advanced Practice Providers (APPs -  Physician Assistants and Nurse Practitioners) who all work together to provide you with the care you need, when you need it.  Special Instructions:  . Try to cut back on how much you are drinking  Call the [place] office at [phone] if you have any questions, problems or concerns.

## 2018-11-07 ENCOUNTER — Other Ambulatory Visit: Payer: Self-pay | Admitting: *Deleted

## 2018-11-07 MED ORDER — POTASSIUM CHLORIDE CRYS ER 20 MEQ PO TBCR: 20.0000 meq | EXTENDED_RELEASE_TABLET | Freq: Every day | ORAL | 3 refills | Status: DC

## 2018-11-08 DIAGNOSIS — S81812D Laceration without foreign body, left lower leg, subsequent encounter: Secondary | ICD-10-CM | POA: Diagnosis not present

## 2018-11-08 DIAGNOSIS — I87311 Chronic venous hypertension (idiopathic) with ulcer of right lower extremity: Secondary | ICD-10-CM | POA: Diagnosis not present

## 2018-11-08 DIAGNOSIS — A4902 Methicillin resistant Staphylococcus aureus infection, unspecified site: Secondary | ICD-10-CM | POA: Diagnosis not present

## 2018-11-08 DIAGNOSIS — L97211 Non-pressure chronic ulcer of right calf limited to breakdown of skin: Secondary | ICD-10-CM | POA: Diagnosis not present

## 2018-11-08 DIAGNOSIS — L03116 Cellulitis of left lower limb: Secondary | ICD-10-CM | POA: Diagnosis not present

## 2018-11-08 DIAGNOSIS — I890 Lymphedema, not elsewhere classified: Secondary | ICD-10-CM | POA: Diagnosis not present

## 2018-11-09 DIAGNOSIS — L97211 Non-pressure chronic ulcer of right calf limited to breakdown of skin: Secondary | ICD-10-CM | POA: Diagnosis not present

## 2018-11-09 DIAGNOSIS — I87311 Chronic venous hypertension (idiopathic) with ulcer of right lower extremity: Secondary | ICD-10-CM | POA: Diagnosis not present

## 2018-11-09 DIAGNOSIS — S81812D Laceration without foreign body, left lower leg, subsequent encounter: Secondary | ICD-10-CM | POA: Diagnosis not present

## 2018-11-09 DIAGNOSIS — L03116 Cellulitis of left lower limb: Secondary | ICD-10-CM | POA: Diagnosis not present

## 2018-11-09 DIAGNOSIS — I890 Lymphedema, not elsewhere classified: Secondary | ICD-10-CM | POA: Diagnosis not present

## 2018-11-09 DIAGNOSIS — A4902 Methicillin resistant Staphylococcus aureus infection, unspecified site: Secondary | ICD-10-CM | POA: Diagnosis not present

## 2018-11-10 ENCOUNTER — Other Ambulatory Visit (HOSPITAL_COMMUNITY): Payer: Self-pay | Admitting: Internal Medicine

## 2018-11-12 ENCOUNTER — Telehealth: Payer: Self-pay | Admitting: Nurse Practitioner

## 2018-11-12 DIAGNOSIS — J449 Chronic obstructive pulmonary disease, unspecified: Secondary | ICD-10-CM | POA: Diagnosis not present

## 2018-11-12 DIAGNOSIS — L97822 Non-pressure chronic ulcer of other part of left lower leg with fat layer exposed: Secondary | ICD-10-CM | POA: Diagnosis not present

## 2018-11-12 DIAGNOSIS — L97222 Non-pressure chronic ulcer of left calf with fat layer exposed: Secondary | ICD-10-CM | POA: Diagnosis not present

## 2018-11-12 DIAGNOSIS — I890 Lymphedema, not elsewhere classified: Secondary | ICD-10-CM | POA: Diagnosis not present

## 2018-11-12 DIAGNOSIS — I872 Venous insufficiency (chronic) (peripheral): Secondary | ICD-10-CM | POA: Diagnosis not present

## 2018-11-12 DIAGNOSIS — L97812 Non-pressure chronic ulcer of other part of right lower leg with fat layer exposed: Secondary | ICD-10-CM | POA: Diagnosis not present

## 2018-11-12 DIAGNOSIS — I110 Hypertensive heart disease with heart failure: Secondary | ICD-10-CM | POA: Diagnosis not present

## 2018-11-12 DIAGNOSIS — I87331 Chronic venous hypertension (idiopathic) with ulcer and inflammation of right lower extremity: Secondary | ICD-10-CM | POA: Diagnosis not present

## 2018-11-12 NOTE — Telephone Encounter (Signed)
  Patient states that Mia Contreras changed her carvedilol (COREG) 6.25 MG tablet and she has started to feel like she has no energy and her blood pressure and heart rate is running low. She would like to know what to do.

## 2018-11-13 ENCOUNTER — Telehealth (HOSPITAL_COMMUNITY): Payer: Self-pay | Admitting: Licensed Clinical Social Worker

## 2018-11-13 ENCOUNTER — Ambulatory Visit: Payer: Medicare Other | Admitting: Nurse Practitioner

## 2018-11-13 ENCOUNTER — Other Ambulatory Visit: Payer: Self-pay | Admitting: *Deleted

## 2018-11-13 MED ORDER — TORSEMIDE 20 MG PO TABS: 60.0000 mg | ORAL_TABLET | Freq: Every day | ORAL | 3 refills | Status: DC

## 2018-11-13 MED ORDER — CARVEDILOL 3.125 MG PO TABS: 6.2500 mg | ORAL_TABLET | Freq: Two times a day (BID) | ORAL | 9 refills | Status: DC

## 2018-11-13 NOTE — Telephone Encounter (Signed)
Attempted multiple calls - she is not answering. Will continue to try.

## 2018-11-13 NOTE — Telephone Encounter (Signed)
CSW received referral from Truitt Merle, NP to assist patient with some follow up care in the home. CSW contacted patient to discuss paramedicine referral and patient very quickly agreeable. CSW explained the program and will have a visit for tomorrow if possible. Patient states she has food, medications and a pill box. Patient agreeable to visit and denies any other concerns at this time. CSW will reach out to [facility], CP to discuss case needs and visit tomorrow. CSW will continue to follow for needs. Raquel Sarna, [place], [place]

## 2018-11-13 NOTE — Telephone Encounter (Signed)
Called Mia Contreras - 12 noon - she just woke up - complains of being tired - wants to go back to Coreg 3.125 mg BID. I talked with her about Dr. Janalyn Rouse message to me (more swelling?)  She does feel it is worse - will increase the Torsemide to 3 pills a day. She seems very mixed up about her medicines. I spoke to a person with her Sharlee Blew [phone]. Shaundra is agreeable to seeing if we can get any support from the Advanced CHF team ?paramedicine??  Marikay prefers her contact to be Franchot Mimes - her neighbor - [phone]  Med list updated. I have reached out to Raquel Sarna with social work - she will be in touch.   Burtis Junes, RN, [place] [address] [place], [place]  [address] [phone]

## 2018-11-14 ENCOUNTER — Other Ambulatory Visit (HOSPITAL_COMMUNITY): Payer: Self-pay

## 2018-11-14 NOTE — Progress Notes (Signed)
Paramedicine Encounter    Patient ID: Mia Contreras, female    DOB: May 13, 1929, 85 y.o.   MRN: [id number]   Patient Care Team: Cassandria Anger, MD as PCP - General Josue Hector, MD as PCP - Cardiology (Cardiology) Josue Hector, MD as Attending Physician (Cardiology) Renato Shin, MD as Consulting Physician (Endocrinology) Rolm Bookbinder, MD as Consulting Physician (Dermatology) Truitt Merle, MD as Consulting Physician (Hematology) Fanny Skates, MD as Consulting Physician (General Surgery) Gery Pray, MD as Consulting Physician (Radiation Oncology)  Patient Active Problem List   Diagnosis Date Noted  . DNR (do not resuscitate) 10/23/2018  . Cellulitis due to methicillin-resistant Staphylococcus aureus (MRSA) 10/09/2018  . Failure to thrive in adult 09/23/2018  . Dehydration 09/16/2018  . AKI (acute kidney injury) ([place]) 09/16/2018  . Genetic testing 07/22/2018  . Personal history of breast cancer 07/01/2018  . Family history of prostate cancer   . Family history of skin cancer   . Family history of lymphoma   . Family history of brain tumor   . Family history of lung cancer   . Severe tricuspid regurgitation 06/19/2018  . Malignant neoplasm of upper-inner quadrant of left breast in female, estrogen receptor positive ([place]) 05/10/2018  . Grief 02/12/2018  . Psoriasis 09/14/2017  . Liver disorder 09/13/2017  . Hematoma of leg 08/24/2017  . Myalgia 06/21/2017  . Rash and nonspecific skin eruption 05/22/2017  . Arthralgia 04/13/2017  . Vaginitis, atrophic 03/06/2017  . Symptomatic bradycardia   . Acute systolic CHF (congestive heart failure) ([place])   . Hyperkalemia 06/06/2016  . Skin tear of left forearm without complication 72/04/4708  . HCAP (healthcare-associated pneumonia) 01/30/2016  . Chronic anticoagulation-Eliquis 01/30/2016  . Hypotension-currently asymptomatic 01/30/2016  . Malnutrition of moderate degree 01/27/2016  . Falls 01/26/2016  . Hypoxia  01/26/2016  . Chronic CHF (congestive heart failure) ([place]) 01/26/2016  . Atrial fibrillation ([place]) 01/11/2016  . Acute kidney injury superimposed on chronic kidney disease ([place]) 12/14/2015  . CRF (chronic renal failure) 12/14/2015  . IBS (irritable bowel syndrome) 12/14/2015  . Wound, open, leg 11/08/2015  . Hiatal hernia 05/12/2015  . Subconjunctival hemorrhage 08/07/2014  . Nausea without vomiting 07/15/2014  . Noninfected skin tear of right leg 07/03/2014  . Hypoglycemia 12/12/2013  . Chronic systolic heart failure ([place]) 08/22/2013  . NICM (nonischemic cardiomyopathy) ([place]) 08/22/2013  . Diarrhea 10/01/2012  . Dyspnea on exertion 06/27/2012  . Asthmatic bronchitis 06/27/2012  . UTI (urinary tract infection) 11/02/2011  . Postoperative anemia due to acute blood loss 11/01/2011  . COPD (chronic obstructive pulmonary disease) ([place]) 08/16/2011  . Cough 08/08/2011  . Preop exam for internal medicine 08/02/2011  . Cerumen impaction 08/02/2011  . Bruit 04/19/2011  . Arrhythmia 03/29/2011  . Neoplasm of uncertain behavior of skin 09/21/2010  . PREMATURE ATRIAL CONTRACTIONS 09/21/2010  . CYSTITIS 09/21/2010  . Thyrotoxicosis 05/23/2010  . GOITER, MULTINODULAR 05/06/2010  . TOBACCO USE, QUIT 06/14/2009  . ELECTROCARDIOGRAM, ABNORMAL 04/02/2009  . SKIN CANCER, HX OF 11/12/2008  . CARPAL TUNNEL SYNDROME, RIGHT 01/11/2008  . SCOLIOSIS 01/11/2008  . VENOUS INSUFFICIENCY 09/05/2007  . Edema 09/05/2007  . Anemia in chronic kidney disease 08/20/2007  . DIVERTICULOSIS, COLON 08/20/2007  . CELLULITIS, LEG, RIGHT 08/20/2007  . Osteoporosis 08/20/2007  . Open wound of knee, leg, and ankle 07/03/2007  . Anxiety 03/16/2007  . Essential hypertension 03/16/2007  . Osteoarthritis 03/16/2007  . hx: breast cancer, left, infiltrating ductal 03/16/2007    Current Outpatient Medications:  .  ALPRAZolam (  XANAX) 0.5 MG tablet, Take 1 tablet (0.5 mg total) by mouth 2 (two) times daily as needed for  anxiety or sleep., Disp: 60 tablet, Rfl: 3 .  carvedilol (COREG) 3.125 MG tablet, Take 2 tablets (6.25 mg total) by mouth 2 (two) times daily with a meal., Disp: 60 tablet, Rfl: 9 .  coal tar (NEUTROGENA T-GEL) 0.5 % shampoo, Apply 1 application topically once a week. , Disp: , Rfl:  .  ELIQUIS 2.5 MG TABS tablet, TAKE ONE TABLET BY MOUTH TWICE A DAY  **MUST CALL MD FOR APPOINTMENT FOR ADDITIONAL REFILLS, Disp: 60 tablet, Rfl: 1 .  loratadine (CLARITIN) 10 MG tablet, Take 1 tablet (10 mg total) by mouth daily as needed for allergies., Disp: 30 tablet, Rfl: 0 .  OXYGEN, Inhale 1.5 L into the lungs at bedtime., Disp: , Rfl:  .  potassium chloride SA (K-DUR,KLOR-CON) 20 MEQ tablet, Take 1 tablet (20 mEq total) by mouth daily., Disp: 180 tablet, Rfl: 3 .  torsemide (DEMADEX) 20 MG tablet, Take 3 tablets (60 mg total) by mouth daily., Disp: 180 tablet, Rfl: 3 .  diphenoxylate-atropine (LOMOTIL) 2.5-0.025 MG/5ML liquid, Take by mouth 3 (three) times daily as needed for diarrhea or loose stools., Disp: , Rfl:  .  ketoconazole (NIZORAL) 2 % shampoo, Apply 1 application topically 2 (two) times a week., Disp: , Rfl:  .  Polyethyl Glycol-Propyl Glycol (SYSTANE ULTRA PF OP), Place 2 drops into both eyes daily as needed (dry eyes). , Disp: , Rfl:  .  triamcinolone ointment (KENALOG) 0.1 %, Apply 1 application topically 2 (two) times daily as needed (on affected area). , Disp: , Rfl:  Allergies  Allergen Reactions  . Cefuroxime Diarrhea  . Celecoxib Nausea Only  . Deltasone [Prednisone] Swelling    Oral deltasone is causing swelling (can use Depo-Medrol)  . Ranitidine Other (See Comments)    bloating  . Spironolactone Other (See Comments)    High K  . Tapazole [Methimazole] Rash  . Tramadol Hcl Nausea Only  . Entresto [Sacubitril-Valsartan] Other (See Comments)    Unknown reaction per pt      Social History   Socioeconomic History  . Marital status: Single    Spouse name: Not on file  . Number  of children: Not on file  . Years of education: Not on file  . Highest education level: Not on file  Occupational History  . Not on file  Social Needs  . Financial resource strain: Not on file  . Food insecurity:    Worry: Not on file    Inability: Not on file  . Transportation needs:    Medical: Not on file    Non-medical: Not on file  Tobacco Use  . Smoking status: Former Smoker    Last attempt to quit: 12/20/1980    Years since quitting: 37.9  . Smokeless tobacco: Never Used  Substance and Sexual Activity  . Alcohol use: Not Currently  . Drug use: No  . Sexual activity: Not Currently  Lifestyle  . Physical activity:    Days per week: Not on file    Minutes per session: Not on file  . Stress: Not on file  Relationships  . Social connections:    Talks on phone: Not on file    Gets together: Not on file    Attends religious service: Not on file    Active member of club or organization: Not on file    Attends meetings of clubs or organizations: Not on  file    Relationship status: Not on file  . Intimate partner violence:    Fear of current or ex partner: Not on file    Emotionally abused: Not on file    Physically abused: Not on file    Forced sexual activity: Not on file  Other Topics Concern  . Not on file  Social History Narrative  . Not on file    Physical Exam Cardiovascular:     Rate and Rhythm: Tachycardia present. Rhythm irregular.     Pulses: Normal pulses.  Pulmonary:     Effort: Pulmonary effort is normal.     Breath sounds: Normal breath sounds.  Musculoskeletal: Normal range of motion.     Right lower leg: Edema present.     Left lower leg: Edema present.  Skin:    General: Skin is warm and dry.     Capillary Refill: Capillary refill takes less than 2 seconds.  Neurological:     Mental Status: She is alert and oriented to person, place, and time.  Psychiatric:        Mood and Affect: Mood normal.         Future Appointments  Date Time  Provider [place]  12/05/2018  1:40 PM Plotnikov, Evie Lacks, MD [facility]  12/18/2018  3:30 PM Burtis Junes, NP [facility]  12/25/2018  1:45 PM Renato Shin, MD [facility] None  12/30/2018  1:30 PM [facility] None  12/30/2018  2:00 PM Truitt Merle, MD [facility] None    BP 111/71 (BP Location: Left Arm, Patient Position: Sitting, Cuff Size: Small)   Pulse 100   Resp 16   SpO2 95%   Weight yesterday- Did not weigh Last visit weight- N/A  Mia Contreras was seen at home today and reported feeling well. The was our first visit as she was just referred by Truitt Merle, NP. She denied chest pain, SOB, headache, dizziness or orthopnea. Mia Contreras has not been weighing daily because she has been rushed in the mornings to various doctors appointments. I stressed the importance of daily weights and she expressed understanding. She advised that she has decreased her carvedilol to 3.125 mg BID after speaking to her nurse practitioner yesterday. This had not been changed in her chart so I contacted her doctors office who confirmed the change. All other medications were verified and her pillbox was checked against her medication list and was found to have been filled correctly. I will check with Raquel Sarna, LCSW, and Truitt Merle, NP, to see if they want to keep her on the paramedicine program.   Truitt Merle, NP [place], EMT 11/14/18  ACTION: Home visit completed

## 2018-11-15 DIAGNOSIS — L03116 Cellulitis of left lower limb: Secondary | ICD-10-CM | POA: Diagnosis not present

## 2018-11-15 DIAGNOSIS — A4902 Methicillin resistant Staphylococcus aureus infection, unspecified site: Secondary | ICD-10-CM | POA: Diagnosis not present

## 2018-11-15 DIAGNOSIS — S81812D Laceration without foreign body, left lower leg, subsequent encounter: Secondary | ICD-10-CM | POA: Diagnosis not present

## 2018-11-15 DIAGNOSIS — L97211 Non-pressure chronic ulcer of right calf limited to breakdown of skin: Secondary | ICD-10-CM | POA: Diagnosis not present

## 2018-11-15 DIAGNOSIS — I890 Lymphedema, not elsewhere classified: Secondary | ICD-10-CM | POA: Diagnosis not present

## 2018-11-15 DIAGNOSIS — I87311 Chronic venous hypertension (idiopathic) with ulcer of right lower extremity: Secondary | ICD-10-CM | POA: Diagnosis not present

## 2018-11-18 ENCOUNTER — Telehealth: Payer: Self-pay | Admitting: Nurse Practitioner

## 2018-11-18 ENCOUNTER — Other Ambulatory Visit (HOSPITAL_COMMUNITY)
Admission: RE | Admit: 2018-11-18 | Discharge: 2018-11-18 | Disposition: A | Discharge status: Home / Self Care | Payer: Medicare Other | Source: Other Acute Inpatient Hospital | Attending: Internal Medicine | Admitting: Internal Medicine

## 2018-11-18 DIAGNOSIS — I110 Hypertensive heart disease with heart failure: Secondary | ICD-10-CM | POA: Diagnosis not present

## 2018-11-18 DIAGNOSIS — L97822 Non-pressure chronic ulcer of other part of left lower leg with fat layer exposed: Secondary | ICD-10-CM | POA: Diagnosis not present

## 2018-11-18 DIAGNOSIS — L97821 Non-pressure chronic ulcer of other part of left lower leg limited to breakdown of skin: Secondary | ICD-10-CM | POA: Insufficient documentation

## 2018-11-18 DIAGNOSIS — L97222 Non-pressure chronic ulcer of left calf with fat layer exposed: Secondary | ICD-10-CM | POA: Diagnosis not present

## 2018-11-18 DIAGNOSIS — I87331 Chronic venous hypertension (idiopathic) with ulcer and inflammation of right lower extremity: Secondary | ICD-10-CM | POA: Diagnosis not present

## 2018-11-18 DIAGNOSIS — J449 Chronic obstructive pulmonary disease, unspecified: Secondary | ICD-10-CM | POA: Diagnosis not present

## 2018-11-18 DIAGNOSIS — L97812 Non-pressure chronic ulcer of other part of right lower leg with fat layer exposed: Secondary | ICD-10-CM | POA: Diagnosis not present

## 2018-11-18 DIAGNOSIS — L97212 Non-pressure chronic ulcer of right calf with fat layer exposed: Secondary | ICD-10-CM | POA: Diagnosis not present

## 2018-11-18 DIAGNOSIS — I872 Venous insufficiency (chronic) (peripheral): Secondary | ICD-10-CM | POA: Diagnosis not present

## 2018-11-18 NOTE — Telephone Encounter (Signed)
° °  Patient is currently calling from Dr Robinson's office to report swelling  1) How much weight have you gained and in what time span? n/a  2) If swelling, where is the swelling located? Legs, feet are weeping  3) Are you currently taking a fluid pill? yes  4) Are you currently SOB? no  5) Do you have a log of your daily weights (if so, list)?  6) Have you gained 3 pounds in a day or 5 pounds in a week?  7) Have you traveled recently? no

## 2018-11-18 NOTE — Telephone Encounter (Signed)
I have called Mia Contreras, She is having more weeping from her legs. She has cut her dose of diuretic back to just 2 pills a day. Notes she is having to change linens every day, legs are weeping into her carpets, etc.. Refuses other assistance - I.e., Hospice. Will try to find out when paramedicine comes back this week to see her. She is still going to the wound clinic and is other wise trying to stay at home.   Overall situation is very tenuous - as clearly lined out in my notes.

## 2018-11-19 ENCOUNTER — Telehealth (HOSPITAL_COMMUNITY): Payer: Self-pay | Admitting: Licensed Clinical Social Worker

## 2018-11-19 ENCOUNTER — Other Ambulatory Visit (HOSPITAL_COMMUNITY): Payer: Self-pay

## 2018-11-19 NOTE — Telephone Encounter (Signed)
CSW attempted to reach patient with no answer. CSW spoke earlier with Mia Contreras, CP about possible home visit today. CSW will attempt again at a later time. Raquel Sarna, [place], [place]

## 2018-11-19 NOTE — Progress Notes (Signed)
Paramedicine Encounter    Patient ID: Mia Contreras, female    DOB: Jan 14, 1929, 85 y.o.   MRN: [id number]   Patient Care Team: Cassandria Anger, MD as PCP - General Josue Hector, MD as PCP - Cardiology (Cardiology) Josue Hector, MD as Attending Physician (Cardiology) Renato Shin, MD as Consulting Physician (Endocrinology) Rolm Bookbinder, MD as Consulting Physician (Dermatology) Truitt Merle, MD as Consulting Physician (Hematology) Fanny Skates, MD as Consulting Physician (General Surgery) Gery Pray, MD as Consulting Physician (Radiation Oncology)  Patient Active Problem List   Diagnosis Date Noted  . DNR (do not resuscitate) 10/23/2018  . Cellulitis due to methicillin-resistant Staphylococcus aureus (MRSA) 10/09/2018  . Failure to thrive in adult 09/23/2018  . Dehydration 09/16/2018  . AKI (acute kidney injury) ([place]) 09/16/2018  . Genetic testing 07/22/2018  . Personal history of breast cancer 07/01/2018  . Family history of prostate cancer   . Family history of skin cancer   . Family history of lymphoma   . Family history of brain tumor   . Family history of lung cancer   . Severe tricuspid regurgitation 06/19/2018  . Malignant neoplasm of upper-inner quadrant of left breast in female, estrogen receptor positive ([place]) 05/10/2018  . Grief 02/12/2018  . Psoriasis 09/14/2017  . Liver disorder 09/13/2017  . Hematoma of leg 08/24/2017  . Myalgia 06/21/2017  . Rash and nonspecific skin eruption 05/22/2017  . Arthralgia 04/13/2017  . Vaginitis, atrophic 03/06/2017  . Symptomatic bradycardia   . Acute systolic CHF (congestive heart failure) ([place])   . Hyperkalemia 06/06/2016  . Skin tear of left forearm without complication 44/08/270  . HCAP (healthcare-associated pneumonia) 01/30/2016  . Chronic anticoagulation-Eliquis 01/30/2016  . Hypotension-currently asymptomatic 01/30/2016  . Malnutrition of moderate degree 01/27/2016  . Falls 01/26/2016  . Hypoxia  01/26/2016  . Chronic CHF (congestive heart failure) ([place]) 01/26/2016  . Atrial fibrillation ([place]) 01/11/2016  . Acute kidney injury superimposed on chronic kidney disease ([place]) 12/14/2015  . CRF (chronic renal failure) 12/14/2015  . IBS (irritable bowel syndrome) 12/14/2015  . Wound, open, leg 11/08/2015  . Hiatal hernia 05/12/2015  . Subconjunctival hemorrhage 08/07/2014  . Nausea without vomiting 07/15/2014  . Noninfected skin tear of right leg 07/03/2014  . Hypoglycemia 12/12/2013  . Chronic systolic heart failure ([place]) 08/22/2013  . NICM (nonischemic cardiomyopathy) ([place]) 08/22/2013  . Diarrhea 10/01/2012  . Dyspnea on exertion 06/27/2012  . Asthmatic bronchitis 06/27/2012  . UTI (urinary tract infection) 11/02/2011  . Postoperative anemia due to acute blood loss 11/01/2011  . COPD (chronic obstructive pulmonary disease) ([place]) 08/16/2011  . Cough 08/08/2011  . Preop exam for internal medicine 08/02/2011  . Cerumen impaction 08/02/2011  . Bruit 04/19/2011  . Arrhythmia 03/29/2011  . Neoplasm of uncertain behavior of skin 09/21/2010  . PREMATURE ATRIAL CONTRACTIONS 09/21/2010  . CYSTITIS 09/21/2010  . Thyrotoxicosis 05/23/2010  . GOITER, MULTINODULAR 05/06/2010  . TOBACCO USE, QUIT 06/14/2009  . ELECTROCARDIOGRAM, ABNORMAL 04/02/2009  . SKIN CANCER, HX OF 11/12/2008  . CARPAL TUNNEL SYNDROME, RIGHT 01/11/2008  . SCOLIOSIS 01/11/2008  . VENOUS INSUFFICIENCY 09/05/2007  . Edema 09/05/2007  . Anemia in chronic kidney disease 08/20/2007  . DIVERTICULOSIS, COLON 08/20/2007  . CELLULITIS, LEG, RIGHT 08/20/2007  . Osteoporosis 08/20/2007  . Open wound of knee, leg, and ankle 07/03/2007  . Anxiety 03/16/2007  . Essential hypertension 03/16/2007  . Osteoarthritis 03/16/2007  . hx: breast cancer, left, infiltrating ductal 03/16/2007    Current Outpatient Medications:  .  ALPRAZolam (  XANAX) 0.5 MG tablet, Take 1 tablet (0.5 mg total) by mouth 2 (two) times daily as needed for  anxiety or sleep., Disp: 60 tablet, Rfl: 3 .  carvedilol (COREG) 3.125 MG tablet, Take 2 tablets (6.25 mg total) by mouth 2 (two) times daily with a meal., Disp: 60 tablet, Rfl: 9 .  coal tar (NEUTROGENA T-GEL) 0.5 % shampoo, Apply 1 application topically once a week. , Disp: , Rfl:  .  diphenoxylate-atropine (LOMOTIL) 2.5-0.025 MG/5ML liquid, Take by mouth 3 (three) times daily as needed for diarrhea or loose stools., Disp: , Rfl:  .  ELIQUIS 2.5 MG TABS tablet, TAKE ONE TABLET BY MOUTH TWICE A DAY  **MUST CALL MD FOR APPOINTMENT FOR ADDITIONAL REFILLS, Disp: 60 tablet, Rfl: 1 .  ketoconazole (NIZORAL) 2 % shampoo, Apply 1 application topically 2 (two) times a week., Disp: , Rfl:  .  loratadine (CLARITIN) 10 MG tablet, Take 1 tablet (10 mg total) by mouth daily as needed for allergies., Disp: 30 tablet, Rfl: 0 .  OXYGEN, Inhale 1.5 L into the lungs at bedtime., Disp: , Rfl:  .  Polyethyl Glycol-Propyl Glycol (SYSTANE ULTRA PF OP), Place 2 drops into both eyes daily as needed (dry eyes). , Disp: , Rfl:  .  potassium chloride SA (K-DUR,KLOR-CON) 20 MEQ tablet, Take 1 tablet (20 mEq total) by mouth daily., Disp: 180 tablet, Rfl: 3 .  torsemide (DEMADEX) 20 MG tablet, Take 3 tablets (60 mg total) by mouth daily., Disp: 180 tablet, Rfl: 3 .  triamcinolone ointment (KENALOG) 0.1 %, Apply 1 application topically 2 (two) times daily as needed (on affected area). , Disp: , Rfl:  Allergies  Allergen Reactions  . Cefuroxime Diarrhea  . Celecoxib Nausea Only  . Deltasone [Prednisone] Swelling    Oral deltasone is causing swelling (can use Depo-Medrol)  . Ranitidine Other (See Comments)    bloating  . Spironolactone Other (See Comments)    High K  . Tapazole [Methimazole] Rash  . Tramadol Hcl Nausea Only  . Entresto [Sacubitril-Valsartan] Other (See Comments)    Unknown reaction per pt      Social History   Socioeconomic History  . Marital status: Single    Spouse name: Not on file  . Number  of children: Not on file  . Years of education: Not on file  . Highest education level: Not on file  Occupational History  . Not on file  Social Needs  . Financial resource strain: Not on file  . Food insecurity:    Worry: Not on file    Inability: Not on file  . Transportation needs:    Medical: Not on file    Non-medical: Not on file  Tobacco Use  . Smoking status: Former Smoker    Last attempt to quit: 12/20/1980    Years since quitting: 37.9  . Smokeless tobacco: Never Used  Substance and Sexual Activity  . Alcohol use: Not Currently  . Drug use: No  . Sexual activity: Not Currently  Lifestyle  . Physical activity:    Days per week: Not on file    Minutes per session: Not on file  . Stress: Not on file  Relationships  . Social connections:    Talks on phone: Not on file    Gets together: Not on file    Attends religious service: Not on file    Active member of club or organization: Not on file    Attends meetings of clubs or organizations: Not on  file    Relationship status: Not on file  . Intimate partner violence:    Fear of current or ex partner: Not on file    Emotionally abused: Not on file    Physically abused: Not on file    Forced sexual activity: Not on file  Other Topics Concern  . Not on file  Social History Narrative  . Not on file    Physical Exam Cardiovascular:     Rate and Rhythm: Normal rate. Rhythm irregular.     Pulses: Normal pulses.  Pulmonary:     Effort: Pulmonary effort is normal.     Breath sounds: Normal breath sounds.  Abdominal:     General: Abdomen is flat. There is no distension.  Musculoskeletal: Normal range of motion.     Right lower leg: Edema present.     Left lower leg: Edema present.  Skin:    General: Skin is warm and dry.     Capillary Refill: Capillary refill takes less than 2 seconds.  Neurological:     Mental Status: She is alert and oriented to person, place, and time.  Psychiatric:        Mood and Affect:  Mood normal.         Future Appointments  Date Time Provider [place]  12/05/2018  1:40 PM Plotnikov, Evie Lacks, MD [facility]  12/18/2018  3:30 PM Burtis Junes, NP [facility]  12/25/2018  1:45 PM Renato Shin, MD [facility] None  12/30/2018  1:30 PM [facility] None  12/30/2018  2:00 PM Truitt Merle, MD [facility] None    BP (!) 91/57 (BP Location: Right Arm, Patient Position: Supine, Cuff Size: Normal)   Pulse 80   Resp 16   Wt 114 lb (51.7 kg)   SpO2 99%   BMI 20.19 kg/m   Weight yesterday- 119.2 lb Last visit weight-   Ms Derocher was seen at home today and reported feeling tired. She denied having chest pain, SOB, headache, dizziness or orthopnea. She stated she has been compliant with her medications since I saw her last and her weight has been trending down. We spent the majority of this visit talking about Hospice and the benefits of their services. She continues to be apprehensive about being taken into their care and has several questions which I was unable to answer. She is agreeing to meet with a representative from Hospice but wants to wait until after the Covid-19 pandemic is under better control. I relayed this information to Raquel Sarna, LCSW, via a phone call while I was with Ms Addair. Her leg wounds have worsened since last week according to her. I was not able to visualize because they are wrapped by the wound care clinic. HHRN is also coming out to change her dressings. She advised they have only been coming out one time per week for the past 12 weeks so I contacted [facility] to check on this since her after visit summary stated she should be seen twice per week by home health for dressing changes. They advised that they received the new order today and would be increasing home visits to twice weekly. I relayed this information to Ms Lemoine and she was agreeable. I will follow up with her next week.   Jacquiline Doe, EMT 11/19/18  ACTION: Home visit completed Next visit planned for 1 week

## 2018-11-21 ENCOUNTER — Encounter (HOSPITAL_BASED_OUTPATIENT_CLINIC_OR_DEPARTMENT_OTHER): Payer: Medicare Other | Attending: Internal Medicine

## 2018-11-21 DIAGNOSIS — Z923 Personal history of irradiation: Secondary | ICD-10-CM | POA: Diagnosis not present

## 2018-11-21 DIAGNOSIS — I87311 Chronic venous hypertension (idiopathic) with ulcer of right lower extremity: Secondary | ICD-10-CM | POA: Diagnosis not present

## 2018-11-21 DIAGNOSIS — B9562 Methicillin resistant Staphylococcus aureus infection as the cause of diseases classified elsewhere: Secondary | ICD-10-CM | POA: Diagnosis not present

## 2018-11-21 DIAGNOSIS — I959 Hypotension, unspecified: Secondary | ICD-10-CM | POA: Insufficient documentation

## 2018-11-21 DIAGNOSIS — I509 Heart failure, unspecified: Secondary | ICD-10-CM | POA: Insufficient documentation

## 2018-11-21 DIAGNOSIS — J449 Chronic obstructive pulmonary disease, unspecified: Secondary | ICD-10-CM | POA: Insufficient documentation

## 2018-11-21 DIAGNOSIS — Z9221 Personal history of antineoplastic chemotherapy: Secondary | ICD-10-CM | POA: Diagnosis not present

## 2018-11-21 DIAGNOSIS — L97812 Non-pressure chronic ulcer of other part of right lower leg with fat layer exposed: Secondary | ICD-10-CM | POA: Diagnosis not present

## 2018-11-21 DIAGNOSIS — I87312 Chronic venous hypertension (idiopathic) with ulcer of left lower extremity: Secondary | ICD-10-CM | POA: Diagnosis not present

## 2018-11-21 DIAGNOSIS — I110 Hypertensive heart disease with heart failure: Secondary | ICD-10-CM | POA: Insufficient documentation

## 2018-11-21 DIAGNOSIS — L97822 Non-pressure chronic ulcer of other part of left lower leg with fat layer exposed: Secondary | ICD-10-CM | POA: Diagnosis not present

## 2018-11-22 DIAGNOSIS — L03116 Cellulitis of left lower limb: Secondary | ICD-10-CM | POA: Diagnosis not present

## 2018-11-22 DIAGNOSIS — I87311 Chronic venous hypertension (idiopathic) with ulcer of right lower extremity: Secondary | ICD-10-CM | POA: Diagnosis not present

## 2018-11-22 DIAGNOSIS — S81812D Laceration without foreign body, left lower leg, subsequent encounter: Secondary | ICD-10-CM | POA: Diagnosis not present

## 2018-11-22 DIAGNOSIS — L97211 Non-pressure chronic ulcer of right calf limited to breakdown of skin: Secondary | ICD-10-CM | POA: Diagnosis not present

## 2018-11-22 DIAGNOSIS — I890 Lymphedema, not elsewhere classified: Secondary | ICD-10-CM | POA: Diagnosis not present

## 2018-11-22 DIAGNOSIS — A4902 Methicillin resistant Staphylococcus aureus infection, unspecified site: Secondary | ICD-10-CM | POA: Diagnosis not present

## 2018-11-22 LAB — AEROBIC CULTURE W GRAM STAIN (SUPERFICIAL SPECIMEN): Gram Stain: NONE SEEN

## 2018-11-25 ENCOUNTER — Other Ambulatory Visit: Payer: Self-pay

## 2018-11-25 ENCOUNTER — Encounter (HOSPITAL_COMMUNITY): Payer: Self-pay | Admitting: Family Medicine

## 2018-11-25 ENCOUNTER — Telehealth: Payer: Self-pay | Admitting: Nurse Practitioner

## 2018-11-25 ENCOUNTER — Emergency Department (HOSPITAL_COMMUNITY): Payer: Medicare Other

## 2018-11-25 ENCOUNTER — Inpatient Hospital Stay (HOSPITAL_COMMUNITY)
Admission: EM | Admit: 2018-11-25 | Discharge: 2018-12-02 | DRG: 871 | Disposition: A | Discharge status: Skilled Nursing Facility | Payer: Medicare Other | Attending: Internal Medicine | Admitting: Internal Medicine

## 2018-11-25 DIAGNOSIS — Z96653 Presence of artificial knee joint, bilateral: Secondary | ICD-10-CM | POA: Diagnosis present

## 2018-11-25 DIAGNOSIS — I10 Essential (primary) hypertension: Secondary | ICD-10-CM | POA: Diagnosis present

## 2018-11-25 DIAGNOSIS — D649 Anemia, unspecified: Secondary | ICD-10-CM | POA: Diagnosis not present

## 2018-11-25 DIAGNOSIS — Z8542 Personal history of malignant neoplasm of other parts of uterus: Secondary | ICD-10-CM | POA: Diagnosis not present

## 2018-11-25 DIAGNOSIS — R188 Other ascites: Secondary | ICD-10-CM | POA: Diagnosis not present

## 2018-11-25 DIAGNOSIS — E872 Acidosis: Secondary | ICD-10-CM | POA: Diagnosis not present

## 2018-11-25 DIAGNOSIS — A4102 Sepsis due to Methicillin resistant Staphylococcus aureus: Secondary | ICD-10-CM | POA: Diagnosis not present

## 2018-11-25 DIAGNOSIS — N170 Acute kidney failure with tubular necrosis: Secondary | ICD-10-CM | POA: Diagnosis present

## 2018-11-25 DIAGNOSIS — J9611 Chronic respiratory failure with hypoxia: Secondary | ICD-10-CM | POA: Diagnosis present

## 2018-11-25 DIAGNOSIS — I482 Chronic atrial fibrillation, unspecified: Secondary | ICD-10-CM | POA: Diagnosis not present

## 2018-11-25 DIAGNOSIS — F419 Anxiety disorder, unspecified: Secondary | ICD-10-CM | POA: Diagnosis present

## 2018-11-25 DIAGNOSIS — N189 Chronic kidney disease, unspecified: Secondary | ICD-10-CM

## 2018-11-25 DIAGNOSIS — R578 Other shock: Secondary | ICD-10-CM | POA: Diagnosis not present

## 2018-11-25 DIAGNOSIS — J449 Chronic obstructive pulmonary disease, unspecified: Secondary | ICD-10-CM | POA: Diagnosis present

## 2018-11-25 DIAGNOSIS — I4819 Other persistent atrial fibrillation: Secondary | ICD-10-CM | POA: Diagnosis not present

## 2018-11-25 DIAGNOSIS — Z79899 Other long term (current) drug therapy: Secondary | ICD-10-CM

## 2018-11-25 DIAGNOSIS — Z85828 Personal history of other malignant neoplasm of skin: Secondary | ICD-10-CM | POA: Diagnosis not present

## 2018-11-25 DIAGNOSIS — Z90710 Acquired absence of both cervix and uterus: Secondary | ICD-10-CM

## 2018-11-25 DIAGNOSIS — Z853 Personal history of malignant neoplasm of breast: Secondary | ICD-10-CM

## 2018-11-25 DIAGNOSIS — Z87891 Personal history of nicotine dependence: Secondary | ICD-10-CM | POA: Diagnosis not present

## 2018-11-25 DIAGNOSIS — G92 Toxic encephalopathy: Secondary | ICD-10-CM | POA: Diagnosis present

## 2018-11-25 DIAGNOSIS — L0390 Cellulitis, unspecified: Secondary | ICD-10-CM

## 2018-11-25 DIAGNOSIS — R627 Adult failure to thrive: Secondary | ICD-10-CM | POA: Diagnosis not present

## 2018-11-25 DIAGNOSIS — R0682 Tachypnea, not elsewhere classified: Secondary | ICD-10-CM | POA: Diagnosis not present

## 2018-11-25 DIAGNOSIS — Z9981 Dependence on supplemental oxygen: Secondary | ICD-10-CM

## 2018-11-25 DIAGNOSIS — M2550 Pain in unspecified joint: Secondary | ICD-10-CM | POA: Diagnosis not present

## 2018-11-25 DIAGNOSIS — I4811 Longstanding persistent atrial fibrillation: Secondary | ICD-10-CM | POA: Diagnosis not present

## 2018-11-25 DIAGNOSIS — T424X5A Adverse effect of benzodiazepines, initial encounter: Secondary | ICD-10-CM | POA: Diagnosis present

## 2018-11-25 DIAGNOSIS — L03116 Cellulitis of left lower limb: Secondary | ICD-10-CM | POA: Diagnosis present

## 2018-11-25 DIAGNOSIS — L8942 Pressure ulcer of contiguous site of back, buttock and hip, stage 2: Secondary | ICD-10-CM | POA: Diagnosis not present

## 2018-11-25 DIAGNOSIS — Z7401 Bed confinement status: Secondary | ICD-10-CM | POA: Diagnosis not present

## 2018-11-25 DIAGNOSIS — N139 Obstructive and reflux uropathy, unspecified: Secondary | ICD-10-CM | POA: Diagnosis not present

## 2018-11-25 DIAGNOSIS — I872 Venous insufficiency (chronic) (peripheral): Secondary | ICD-10-CM | POA: Diagnosis present

## 2018-11-25 DIAGNOSIS — I959 Hypotension, unspecified: Secondary | ICD-10-CM | POA: Diagnosis not present

## 2018-11-25 DIAGNOSIS — I890 Lymphedema, not elsewhere classified: Secondary | ICD-10-CM | POA: Diagnosis not present

## 2018-11-25 DIAGNOSIS — A419 Sepsis, unspecified organism: Secondary | ICD-10-CM

## 2018-11-25 DIAGNOSIS — I4891 Unspecified atrial fibrillation: Secondary | ICD-10-CM | POA: Diagnosis not present

## 2018-11-25 DIAGNOSIS — Z7901 Long term (current) use of anticoagulants: Secondary | ICD-10-CM | POA: Diagnosis not present

## 2018-11-25 DIAGNOSIS — K5900 Constipation, unspecified: Secondary | ICD-10-CM | POA: Diagnosis present

## 2018-11-25 DIAGNOSIS — R0602 Shortness of breath: Secondary | ICD-10-CM | POA: Diagnosis not present

## 2018-11-25 DIAGNOSIS — E079 Disorder of thyroid, unspecified: Secondary | ICD-10-CM | POA: Diagnosis present

## 2018-11-25 DIAGNOSIS — M810 Age-related osteoporosis without current pathological fracture: Secondary | ICD-10-CM | POA: Diagnosis present

## 2018-11-25 DIAGNOSIS — N179 Acute kidney failure, unspecified: Secondary | ICD-10-CM | POA: Diagnosis present

## 2018-11-25 DIAGNOSIS — I5022 Chronic systolic (congestive) heart failure: Secondary | ICD-10-CM | POA: Diagnosis present

## 2018-11-25 DIAGNOSIS — I130 Hypertensive heart and chronic kidney disease with heart failure and stage 1 through stage 4 chronic kidney disease, or unspecified chronic kidney disease: Secondary | ICD-10-CM | POA: Diagnosis present

## 2018-11-25 DIAGNOSIS — E785 Hyperlipidemia, unspecified: Secondary | ICD-10-CM | POA: Diagnosis present

## 2018-11-25 DIAGNOSIS — I5042 Chronic combined systolic (congestive) and diastolic (congestive) heart failure: Secondary | ICD-10-CM | POA: Diagnosis not present

## 2018-11-25 DIAGNOSIS — R278 Other lack of coordination: Secondary | ICD-10-CM | POA: Diagnosis not present

## 2018-11-25 DIAGNOSIS — N183 Chronic kidney disease, stage 3 (moderate): Secondary | ICD-10-CM | POA: Diagnosis present

## 2018-11-25 DIAGNOSIS — N289 Disorder of kidney and ureter, unspecified: Secondary | ICD-10-CM

## 2018-11-25 DIAGNOSIS — E875 Hyperkalemia: Secondary | ICD-10-CM | POA: Diagnosis not present

## 2018-11-25 DIAGNOSIS — R5381 Other malaise: Secondary | ICD-10-CM | POA: Diagnosis not present

## 2018-11-25 DIAGNOSIS — L89152 Pressure ulcer of sacral region, stage 2: Secondary | ICD-10-CM | POA: Diagnosis present

## 2018-11-25 DIAGNOSIS — K449 Diaphragmatic hernia without obstruction or gangrene: Secondary | ICD-10-CM | POA: Diagnosis present

## 2018-11-25 DIAGNOSIS — R2689 Other abnormalities of gait and mobility: Secondary | ICD-10-CM | POA: Diagnosis not present

## 2018-11-25 DIAGNOSIS — L89159 Pressure ulcer of sacral region, unspecified stage: Secondary | ICD-10-CM | POA: Diagnosis not present

## 2018-11-25 DIAGNOSIS — Z66 Do not resuscitate: Secondary | ICD-10-CM | POA: Diagnosis present

## 2018-11-25 DIAGNOSIS — S81802A Unspecified open wound, left lower leg, initial encounter: Secondary | ICD-10-CM | POA: Diagnosis not present

## 2018-11-25 DIAGNOSIS — I5084 End stage heart failure: Secondary | ICD-10-CM | POA: Diagnosis present

## 2018-11-25 DIAGNOSIS — L03115 Cellulitis of right lower limb: Secondary | ICD-10-CM | POA: Diagnosis not present

## 2018-11-25 DIAGNOSIS — L8990 Pressure ulcer of unspecified site, unspecified stage: Secondary | ICD-10-CM

## 2018-11-25 DIAGNOSIS — S81801A Unspecified open wound, right lower leg, initial encounter: Secondary | ICD-10-CM | POA: Diagnosis not present

## 2018-11-25 LAB — COMPREHENSIVE METABOLIC PANEL
ALT: 27 U/L (ref 0–44)
AST: 22 U/L (ref 15–41)
Albumin: 3.5 g/dL (ref 3.5–5.0)
Alkaline Phosphatase: 115 U/L (ref 38–126)
Anion gap: 6 (ref 5–15)
BUN: 66 mg/dL — ABNORMAL HIGH (ref 8–23)
CO2: 26 mmol/L (ref 22–32)
Calcium: 7.8 mg/dL — ABNORMAL LOW (ref 8.9–10.3)
Chloride: 103 mmol/L (ref 98–111)
Creatinine, Ser: 1.88 mg/dL — ABNORMAL HIGH (ref 0.44–1.00)
GFR calc Af Amer: 27 mL/min — ABNORMAL LOW (ref >60)
GFR calc non Af Amer: 24 mL/min — ABNORMAL LOW (ref >60)
Glucose, Bld: 123 mg/dL — ABNORMAL HIGH (ref 70–99)
Potassium: 6.1 mmol/L — ABNORMAL HIGH (ref 3.5–5.1)
Sodium: 135 mmol/L (ref 135–145)
Total Bilirubin: 1.1 mg/dL (ref 0.3–1.2)
Total Protein: 6.1 g/dL — ABNORMAL LOW (ref 6.5–8.1)

## 2018-11-25 LAB — CBC WITH DIFFERENTIAL/PLATELET
Abs Immature Granulocytes: 0.22 10*3/uL — ABNORMAL HIGH (ref 0.00–0.07)
Basophils Absolute: 0 10*3/uL (ref 0.0–0.1)
Basophils Relative: 0 %
Eosinophils Absolute: 0.2 10*3/uL (ref 0.0–0.5)
Eosinophils Relative: 1 %
HCT: 43.2 % (ref 36.0–46.0)
Hemoglobin: 13.4 g/dL (ref 12.0–15.0)
Immature Granulocytes: 1 %
Lymphocytes Relative: 2 %
Lymphs Abs: 0.3 10*3/uL — ABNORMAL LOW (ref 0.7–4.0)
MCH: 31.8 pg (ref 26.0–34.0)
MCHC: 31 g/dL (ref 30.0–36.0)
MCV: 102.4 fL — ABNORMAL HIGH (ref 80.0–100.0)
Monocytes Absolute: 1.2 10*3/uL — ABNORMAL HIGH (ref 0.1–1.0)
Monocytes Relative: 7 %
Neutro Abs: 14.7 10*3/uL — ABNORMAL HIGH (ref 1.7–7.7)
Neutrophils Relative %: 89 %
Platelets: 224 10*3/uL (ref 150–400)
RBC: 4.22 MIL/uL (ref 3.87–5.11)
RDW: 19.9 % — ABNORMAL HIGH (ref 11.5–15.5)
WBC: 16.6 10*3/uL — ABNORMAL HIGH (ref 4.0–10.5)
nRBC: 0.3 % — ABNORMAL HIGH (ref 0.0–0.2)

## 2018-11-25 LAB — LACTIC ACID, PLASMA
Lactic Acid, Venous: 1.4 mmol/L (ref 0.5–1.9)
Lactic Acid, Venous: 2.3 mmol/L (ref 0.5–1.9)

## 2018-11-25 MED ORDER — VANCOMYCIN HCL IN DEXTROSE 1-5 GM/200ML-% IV SOLN
1000.0000 mg | Freq: Once | INTRAVENOUS | Status: AC
  Administered 2018-11-25: 16:00:00 1000 mg via INTRAVENOUS
  Filled 2018-11-25: qty 200

## 2018-11-25 MED ORDER — SODIUM CHLORIDE 0.9 % IV BOLUS
500.0000 mL | Freq: Once | INTRAVENOUS | Status: AC
  Administered 2018-11-25: 15:00:00 500 mL via INTRAVENOUS

## 2018-11-25 MED ORDER — SODIUM ZIRCONIUM CYCLOSILICATE 5 G PO PACK
5.0000 g | PACK | Freq: Once | ORAL | Status: AC
  Administered 2018-11-25: 17:00:00 5 g via ORAL
  Filled 2018-11-25: qty 1

## 2018-11-25 MED ORDER — HYDROCODONE-ACETAMINOPHEN 5-325 MG PO TABS
2.0000 | ORAL_TABLET | Freq: Once | ORAL | Status: AC
  Administered 2018-11-25: 22:00:00 2 via ORAL
  Filled 2018-11-25: qty 2

## 2018-11-25 MED ORDER — SODIUM CHLORIDE 0.9 % IV SOLN: 1.0000 g | INTRAVENOUS | Status: DC

## 2018-11-25 MED ORDER — SODIUM CHLORIDE 0.9 % IV SOLN: 2.0000 g | Freq: Once | INTRAVENOUS | Status: DC

## 2018-11-25 MED ORDER — HYDROCODONE-ACETAMINOPHEN 5-325 MG PO TABS
1.0000 | ORAL_TABLET | Freq: Four times a day (QID) | ORAL | Status: DC | PRN
  Administered 2018-11-26 (×2): 2 via ORAL
  Administered 2018-11-27: 16:00:00 1 via ORAL
  Administered 2018-11-28: 10:00:00 2 via ORAL
  Filled 2018-11-25 (×2): qty 2
  Filled 2018-11-25: qty 1
  Filled 2018-11-25: qty 2

## 2018-11-25 MED ORDER — VANCOMYCIN HCL IN DEXTROSE 1-5 GM/200ML-% IV SOLN: 1000.0000 mg | Freq: Once | INTRAVENOUS | Status: DC

## 2018-11-25 MED ORDER — SODIUM CHLORIDE 0.9 % IV SOLN
2.0000 g | INTRAVENOUS | Status: AC
  Administered 2018-11-25: 16:00:00 2 g via INTRAVENOUS
  Filled 2018-11-25: qty 20

## 2018-11-25 MED ORDER — VANCOMYCIN HCL 500 MG IV SOLR
500.0000 mg | INTRAVENOUS | Status: DC
  Administered 2018-11-27: 05:00:00 500 mg via INTRAVENOUS
  Filled 2018-11-25: qty 500

## 2018-11-25 MED ORDER — SODIUM CHLORIDE 0.9 % IV SOLN
1000.0000 mL | INTRAVENOUS | Status: DC
  Administered 2018-11-25 (×2): 1000 mL via INTRAVENOUS

## 2018-11-25 NOTE — Telephone Encounter (Signed)
° ° °  Mr Mia Contreras [facility]) calling to report patient has weakness. States patient unable to prepare food, unable to walk without assistance. They are currently at an appointment at the would clinic , but he feels someone needs to have  Conversation regarding hospice care.  Requesting a call at [phone]

## 2018-11-25 NOTE — Telephone Encounter (Signed)
Follow Up:   Pt is at the [facility]. The doctor recommends that she will be admitted to the hospital. Mr Percell Miller wants to know which hospital Mia Contreras thinks she should go to? Pt does not want to go, because she is scare.She has a bacterial infection.

## 2018-11-25 NOTE — ED Provider Notes (Signed)
[facility] DEPT Provider Note   CSN: [id number] Arrival date & time: 11/25/18  1333    History   Chief Complaint Chief Complaint  Patient presents with  . Wound Infection    HPI Mia Contreras is a 85 y.o. female.     This is a 85 year old female who presents due to failure of outpatient antibiotics for her bilateral lower extremity cellulitis.  States she is had increasing weakness and has had trouble doing her ADLs.  Denies any cough congestion or chest pain.  Does have a history of CHF.  No recent fever or chills.  Patient saw her wound care doctor today who sent the patient in for admission for IV antibiotics.     Past Medical History:  Diagnosis Date  . AKI (acute kidney injury) ([place]) 01/2016  . ANEMIA-NOS 08/20/2007  . ANXIETY 03/16/2007  . BREAST CANCER, HX OF 03/16/2007  . CARPAL TUNNEL SYNDROME, RIGHT 01/11/2008  . Cataract    floaters  . CELLULITIS, LEG, RIGHT 08/20/2007  . CHF (congestive heart failure) ([place])   . Collagenous colitis   . Congestive heart failure with right ventricular systolic dysfunction ([place]) 06/19/2018  . COPD (chronic obstructive pulmonary disease) ([place])   . DIVERTICULOSIS, COLON 08/20/2007  . Dyspnea    on exertion  . Dysrhythmia    atrial fib  . Family history of brain tumor   . Family history of lung cancer   . Family history of lymphoma   . Family history of prostate cancer   . Family history of skin cancer   . GOITER, MULTINODULAR 05/06/2010   Dr [name]  . HYPERLIPIDEMIA 08/20/2007  . HYPERTENSION 03/16/2007  . HYPERTHYROIDISM 05/23/2010  . IBS (irritable bowel syndrome)   . OSTEOARTHRITIS 03/16/2007  . OSTEOPOROSIS 08/20/2007  . Personal history of breast cancer 07/01/2018  . Pneumonia 07/2011   took abx for several weeks  . PREMATURE ATRIAL CONTRACTIONS 09/21/2010  . SCOLIOSIS 01/11/2008  . Severe tricuspid regurgitation 06/19/2018  . SKIN CANCER, HX OF 11/12/2008    R cheek 2010, L Cheek 2011  Dr. Tonia Brooms  . Thyroid nodule   . Uterine cancer ([place])   . VENOUS INSUFFICIENCY 09/05/2007    Patient Active Problem List   Diagnosis Date Noted  . DNR (do not resuscitate) 10/23/2018  . Cellulitis due to methicillin-resistant Staphylococcus aureus (MRSA) 10/09/2018  . Failure to thrive in adult 09/23/2018  . Dehydration 09/16/2018  . AKI (acute kidney injury) ([place]) 09/16/2018  . Genetic testing 07/22/2018  . Personal history of breast cancer 07/01/2018  . Family history of prostate cancer   . Family history of skin cancer   . Family history of lymphoma   . Family history of brain tumor   . Family history of lung cancer   . Severe tricuspid regurgitation 06/19/2018  . Malignant neoplasm of upper-inner quadrant of left breast in female, estrogen receptor positive ([place]) 05/10/2018  . Grief 02/12/2018  . Psoriasis 09/14/2017  . Liver disorder 09/13/2017  . Hematoma of leg 08/24/2017  . Myalgia 06/21/2017  . Rash and nonspecific skin eruption 05/22/2017  . Arthralgia 04/13/2017  . Vaginitis, atrophic 03/06/2017  . Symptomatic bradycardia   . Acute systolic CHF (congestive heart failure) ([place])   . Hyperkalemia 06/06/2016  . Skin tear of left forearm without complication 78/58/8502  . HCAP (healthcare-associated pneumonia) 01/30/2016  . Chronic anticoagulation-Eliquis 01/30/2016  . Hypotension-currently asymptomatic 01/30/2016  . Malnutrition of moderate degree 01/27/2016  . Falls 01/26/2016  . Hypoxia  01/26/2016  . Chronic CHF (congestive heart failure) ([place]) 01/26/2016  . Atrial fibrillation ([place]) 01/11/2016  . Acute kidney injury superimposed on chronic kidney disease ([place]) 12/14/2015  . CRF (chronic renal failure) 12/14/2015  . IBS (irritable bowel syndrome) 12/14/2015  . Wound, open, leg 11/08/2015  . Hiatal hernia 05/12/2015  . Subconjunctival hemorrhage 08/07/2014  . Nausea without vomiting 07/15/2014  . Noninfected skin tear of right leg 07/03/2014  . Hypoglycemia  12/12/2013  . Chronic systolic heart failure ([place]) 08/22/2013  . NICM (nonischemic cardiomyopathy) ([place]) 08/22/2013  . Diarrhea 10/01/2012  . Dyspnea on exertion 06/27/2012  . Asthmatic bronchitis 06/27/2012  . UTI (urinary tract infection) 11/02/2011  . Postoperative anemia due to acute blood loss 11/01/2011  . COPD (chronic obstructive pulmonary disease) ([place]) 08/16/2011  . Cough 08/08/2011  . Preop exam for internal medicine 08/02/2011  . Cerumen impaction 08/02/2011  . Bruit 04/19/2011  . Arrhythmia 03/29/2011  . Neoplasm of uncertain behavior of skin 09/21/2010  . PREMATURE ATRIAL CONTRACTIONS 09/21/2010  . CYSTITIS 09/21/2010  . Thyrotoxicosis 05/23/2010  . GOITER, MULTINODULAR 05/06/2010  . TOBACCO USE, QUIT 06/14/2009  . ELECTROCARDIOGRAM, ABNORMAL 04/02/2009  . SKIN CANCER, HX OF 11/12/2008  . CARPAL TUNNEL SYNDROME, RIGHT 01/11/2008  . SCOLIOSIS 01/11/2008  . VENOUS INSUFFICIENCY 09/05/2007  . Edema 09/05/2007  . Anemia in chronic kidney disease 08/20/2007  . DIVERTICULOSIS, COLON 08/20/2007  . CELLULITIS, LEG, RIGHT 08/20/2007  . Osteoporosis 08/20/2007  . Open wound of knee, leg, and ankle 07/03/2007  . Anxiety 03/16/2007  . Essential hypertension 03/16/2007  . Osteoarthritis 03/16/2007  . hx: breast cancer, left, infiltrating ductal 03/16/2007    Past Surgical History:  Procedure Laterality Date  . ABDOMINAL HYSTERECTOMY    . APPENDECTOMY  1972  . BREAST LUMPECTOMY Left 2001  . BREAST LUMPECTOMY WITH RADIOACTIVE SEED LOCALIZATION Left 06/11/2018   Procedure: LEFT BREAST LUMPECTOMY WITH BRACKETED RADIOACTIVE SEED LOCALIZATION;  Surgeon: Fanny Skates, MD;  Location: [facility];  Service: General;  Laterality: Left;  . CARPAL TUNNEL RELEASE    . CATARACT EXTRACTION    . EYE SURGERY     cataract ext  . HERNIA REPAIR    . MASTECTOMY, PARTIAL Left   . SKIN CANCER EXCISION     face-Dr. Bing Plume  . TOTAL KNEE ARTHROPLASTY    . TOTAL KNEE ARTHROPLASTY Right 2010    Dr Para March  . TOTAL KNEE ARTHROPLASTY  10/30/2011   Procedure: TOTAL KNEE ARTHROPLASTY;  Surgeon: Lorn Junes, MD;  Location: [facility];  Service: Orthopedics;  Laterality: Left;     . XRT     chemo, surgery     OB History   No obstetric history on file.      Home Medications    Prior to Admission medications   Medication Sig Start Date End Date Taking? Authorizing Provider  ALPRAZolam Duanne Moron) 0.5 MG tablet Take 1 tablet (0.5 mg total) by mouth 2 (two) times daily as needed for anxiety or sleep. 10/23/18   Plotnikov, Evie Lacks, MD  carvedilol (COREG) 3.125 MG tablet Take 2 tablets (6.25 mg total) by mouth 2 (two) times daily with a meal. 11/13/18   Burtis Junes, NP  coal tar (NEUTROGENA T-GEL) 0.5 % shampoo Apply 1 application topically once a week.     [provider]  diphenoxylate-atropine (LOMOTIL) 2.5-0.025 MG/5ML liquid Take by mouth 3 (three) times daily as needed for diarrhea or loose stools.    [provider]  ELIQUIS 2.5 MG TABS tablet  TAKE ONE TABLET BY MOUTH TWICE A DAY  **MUST CALL MD FOR APPOINTMENT FOR ADDITIONAL REFILLS 11/11/18   Bensimhon, Shaune Pascal, MD  ketoconazole (NIZORAL) 2 % shampoo Apply 1 application topically 2 (two) times a week.    [provider]  loratadine (CLARITIN) 10 MG tablet Take 1 tablet (10 mg total) by mouth daily as needed for allergies. 09/17/18   Debbe Odea, MD  OXYGEN Inhale 1.5 L into the lungs at bedtime.    [provider]  Polyethyl Glycol-Propyl Glycol (SYSTANE ULTRA PF OP) Place 2 drops into both eyes daily as needed (dry eyes).     [provider]  potassium chloride SA (K-DUR,KLOR-CON) 20 MEQ tablet Take 1 tablet (20 mEq total) by mouth daily. 11/07/18   Burtis Junes, NP  torsemide (DEMADEX) 20 MG tablet Take 3 tablets (60 mg total) by mouth daily. 11/13/18   Burtis Junes, NP  triamcinolone ointment (KENALOG) 0.1 % Apply 1 application topically 2 (two) times daily as needed (on affected  area).     [provider]    Family History Family History  Problem Relation Age of Onset  . Dementia Mother   . Heart disease Mother   . Mental retardation Mother   . Hypertension Mother   . Alzheimer's disease Mother   . Heart attack Father   . Alcohol abuse Father   . Prostate cancer Brother        dx >50  . Bladder Cancer Brother        dx >50  . Diabetes Other   . Brain cancer Maternal Aunt 70  . Brain cancer Maternal Uncle 40  . Lung cancer Paternal Uncle   . Heart attack Maternal Grandmother   . Heart attack Maternal Grandfather   . Lymphoma Other 38       [facility]  . Skin cancer Other   . Colon cancer Neg Hx   . Anesthesia problems Neg Hx     Social History Social History   Tobacco Use  . Smoking status: Former Smoker    Last attempt to quit: 12/20/1980    Years since quitting: 37.9  . Smokeless tobacco: Never Used  Substance Use Topics  . Alcohol use: Not Currently  . Drug use: No     Allergies   Cefuroxime; Celecoxib; Deltasone [prednisone]; Ranitidine; Spironolactone; Tapazole [methimazole]; Tramadol hcl; and Entresto [sacubitril-valsartan]   Review of Systems Review of Systems  All other systems reviewed and are negative.    Physical Exam Updated Vital Signs BP (!) 82/54 (BP Location: Left Arm)   Pulse (!) 103   Temp (!) 97.3 F (36.3 C) (Oral)   Resp 14   Ht 1.6 m (5\' 3" )   Wt 51.7 kg   SpO2 94%   BMI 20.19 kg/m   Physical Exam Vitals signs and nursing note reviewed.  Constitutional:      General: She is not in acute distress.    Appearance: Normal appearance. She is well-developed. She is not toxic-appearing.  HENT:     Head: Normocephalic and atraumatic.  Eyes:     General: Lids are normal.     Conjunctiva/sclera: Conjunctivae normal.     Pupils: Pupils are equal, round, and reactive to light.  Neck:     Musculoskeletal: Normal range of motion and neck supple.     Thyroid: No thyroid mass.     Trachea: No tracheal  deviation.  Cardiovascular:     Rate and Rhythm: Normal rate  and regular rhythm.     Heart sounds: Normal heart sounds. No murmur. No gallop.   Pulmonary:     Effort: Pulmonary effort is normal. No respiratory distress.     Breath sounds: Normal breath sounds. No stridor. No decreased breath sounds, wheezing, rhonchi or rales.  Abdominal:     General: Bowel sounds are normal. There is no distension.     Palpations: Abdomen is soft.     Tenderness: There is no abdominal tenderness. There is no rebound.  Musculoskeletal: Normal range of motion.        General: No tenderness.     Comments: Bilateral lower extremity wounds noted without active extravasation of pus but surrounding erythema noted.  Palpable dorsalis pedis pulses weak bilaterally.  No gross edema noted  Skin:    General: Skin is warm and dry.     Findings: Erythema present.  Neurological:     Mental Status: She is alert and oriented to person, place, and time.     GCS: GCS eye subscore is 4. GCS verbal subscore is 5. GCS motor subscore is 6.     Cranial Nerves: No cranial nerve deficit.     Sensory: No sensory deficit.  Psychiatric:        Speech: Speech normal.        Behavior: Behavior normal.      ED Treatments / Results  Labs (all labs ordered are listed, but only abnormal results are displayed) Labs Reviewed  CULTURE, BLOOD (ROUTINE X 2)  CULTURE, BLOOD (ROUTINE X 2)  LACTIC ACID, PLASMA  LACTIC ACID, PLASMA  COMPREHENSIVE METABOLIC PANEL  CBC WITH DIFFERENTIAL/PLATELET  URINALYSIS, ROUTINE W REFLEX MICROSCOPIC    EKG None  Radiology No results found.  Procedures Procedures (including critical care time)  Medications Ordered in ED Medications  0.9 %  sodium chloride infusion (has no administration in time range)  vancomycin (VANCOCIN) IVPB 1000 mg/200 mL premix (has no administration in time range)  aztreonam (AZACTAM) 2 g in sodium chloride 0.9 % 100 mL IVPB (has no administration in time range)   sodium chloride 0.9 % bolus 500 mL (has no administration in time range)     Initial Impression / Assessment and Plan / ED Course  I have reviewed the triage vital signs and the nursing notes.  Pertinent labs & imaging results that were available during my care of the patient were reviewed by me and considered in my medical decision making (see chart for details).        Patient started on empiric antibiotics for her nonhealing wounds.  Patient's hypotension noted and treated with IV fluids.  Patient's blood pressure responded well to IV fluids.  She does have a leukocytosis as well as evidenced renal insufficiency and hyperkalemia.  Given Lokelma for her hyperkalemia.  Started on IV antibiotics.  Suspect possible bacteremia.  Blood cultures obtained.  Patient will be admitted to the hospital  CRITICAL CARE Performed by: Leota Jacobsen Total critical care time: 55 minutes Critical care time was exclusive of separately billable procedures and treating other patients. Critical care was necessary to treat or prevent imminent or life-threatening deterioration. Critical care was time spent personally by me on the following activities: development of treatment plan with patient and/or surrogate as well as nursing, discussions with consultants, evaluation of patient's response to treatment, examination of patient, obtaining history from patient or surrogate, ordering and performing treatments and interventions, ordering and review of laboratory studies, ordering and review of radiographic  studies, pulse oximetry and re-evaluation of patient's condition.  Final Clinical Impressions(s) / ED Diagnoses   Final diagnoses:  None    ED Discharge Orders    None       Lacretia Leigh, MD 11/25/18 [phone]

## 2018-11-25 NOTE — Progress Notes (Signed)
Pharmacy Antibiotic Note  Mia Contreras is a 85 y.o. female admitted on 11/25/2018 with cellulitis.  PMH significant for bilateral lower extremity cellulitis and sent to ED due to worsening on outpatient antibiotics.  3/30 leg wound culture shows + MRSA (final result 11/22/2018).  In the ED, patient received Vancomycin 1gm and Ceftriaxone 2gm IV x 1 dose each.  Upon admission, Pharmacy has been consulted for Vancomycin dosing.  Plan:  Vancomycin 500 mg IV Q 36 hrs. Goal AUC 400-550.  Expected AUC: 479 SCr used: 1.88  Follow renal function  Follow blood culture results and sensitivities   Height: 5\' 3"  (160 cm) Weight: 114 lb (51.7 kg) IBW/kg (Calculated) : 52.4  Temp (24hrs), Avg:97.3 F (36.3 C), Min:97.3 F (36.3 C), Max:97.3 F (36.3 C)  Recent Labs  Lab 11/25/18 1533 11/25/18 1534  WBC 16.6*  --   CREATININE 1.88*  --   LATICACIDVEN  --  1.4    Estimated Creatinine Clearance: 17.2 mL/min (A) (by C-G formula based on SCr of 1.88 mg/dL (H)).    Allergies  Allergen Reactions  . Cefuroxime Diarrhea  . Celecoxib Nausea Only  . Deltasone [Prednisone] Swelling    Oral deltasone is causing swelling (can use Depo-Medrol)  . Ranitidine Other (See Comments)    bloating  . Spironolactone Other (See Comments)    High K  . Tapazole [Methimazole] Rash  . Tramadol Hcl Nausea Only  . Entresto [Sacubitril-Valsartan] Other (See Comments)    Unknown reaction per pt    Antimicrobials this admission: 4/6 Ceftriaxone x 1 dose  4/6 Vanc >>    Dose adjustments this admission:    Microbiology results: (PTA 3/30 Leg wound Cx: +MRSA) 4/6 BCx: sent  Thank you for allowing pharmacy to be a part of this patient's care.  Everette Rank, PharmD 11/25/2018 5:29 PM

## 2018-11-25 NOTE — H&P (Addendum)
History and Physical    Mia Contreras UXN:[id number] DOB: 10/18/1928 DOA: 11/25/2018  PCP: Cassandria Anger, MD   Patient coming from: Home  Chief Complaint: Leg pain and weeping  HPI: Mia Contreras is a 85 y.o. female with medical history significant for hypertension, heart failure EF 20-30%, A. fib on Eliquis, COPD without supplemental oxygen, who has been following wound care with Dr. Dellia Nims in the outpatient setting over the past few months for chronic lower extremity wounds, these appear to be bilateral and poorly healing. She otherwise declines any fevers chills or other symptoms other than ongoing worsening leg pain (now 6 out of 10) over the past 48 hours and worsening skin weeping/drainage over the past week.  Of note patient was recently discharged from this facility on 10/15/2018 requiring IV vancomycin after failing both Bactrim and doxycycline in the outpatient setting.  Patient currently meets sepsis criteria given abnormal vitals, leukocytosis and bilateral lower extremity wounds.  Hospitalist called to admit for IV antibiotics for failure of outpatient treatment of sepsis secondary to bilateral lower extremity cellulitis.  ED Course: In the ED patient had routine labs and imaging including portable chest x-ray which was reviewed personally and unremarkable, labs were remarkable for mild hyperkalemia, elevated creatinine above baseline, leukocytosis as well as mild hypotension at intake.  Patient currently meets sepsis criteria given mild hypothermia, tachycardia, tachypnea with notable bilateral lower extremity wounds.  Review of Systems: As per HPI otherwise 10 point review of systems negative.   Past Medical History:  Diagnosis Date   AKI (acute kidney injury) ([place]) 01/2016   ANEMIA-NOS 08/20/2007   ANXIETY 03/16/2007   BREAST CANCER, HX OF 03/16/2007   CARPAL TUNNEL SYNDROME, RIGHT 01/11/2008   Cataract    floaters   CELLULITIS, LEG, RIGHT 08/20/2007   CHF  (congestive heart failure) (HCC)    Collagenous colitis    Congestive heart failure with right ventricular systolic dysfunction ([place]) 06/19/2018   COPD (chronic obstructive pulmonary disease) ([place])    DIVERTICULOSIS, COLON 08/20/2007   Dyspnea    on exertion   Dysrhythmia    atrial fib   Family history of brain tumor    Family history of lung cancer    Family history of lymphoma    Family history of prostate cancer    Family history of skin cancer    GOITER, MULTINODULAR 05/06/2010   Dr [name]   HYPERLIPIDEMIA 08/20/2007   HYPERTENSION 03/16/2007   HYPERTHYROIDISM 05/23/2010   IBS (irritable bowel syndrome)    OSTEOARTHRITIS 03/16/2007   OSTEOPOROSIS 08/20/2007   Personal history of breast cancer 07/01/2018   Pneumonia 07/2011   took abx for several weeks   PREMATURE ATRIAL CONTRACTIONS 09/21/2010   SCOLIOSIS 01/11/2008   Severe tricuspid regurgitation 06/19/2018   SKIN CANCER, HX OF 11/12/2008    R cheek 2010, L Cheek 2011 Dr. Tonia Brooms   Thyroid nodule    Uterine cancer [facility])    VENOUS INSUFFICIENCY 09/05/2007    Past Surgical History:  Procedure Laterality Date   ABDOMINAL HYSTERECTOMY     APPENDECTOMY  1972   BREAST LUMPECTOMY Left 2001   BREAST LUMPECTOMY WITH RADIOACTIVE SEED LOCALIZATION Left 06/11/2018   Procedure: LEFT BREAST LUMPECTOMY WITH BRACKETED RADIOACTIVE SEED LOCALIZATION;  Surgeon: Fanny Skates, MD;  Location: [place];  Service: General;  Laterality: Left;   CARPAL TUNNEL RELEASE     CATARACT EXTRACTION     EYE SURGERY     cataract ext   HERNIA REPAIR  MASTECTOMY, PARTIAL Left    SKIN CANCER EXCISION     face-Dr. Bing Plume   TOTAL KNEE ARTHROPLASTY     TOTAL KNEE ARTHROPLASTY Right 2010   Dr Para March   TOTAL KNEE ARTHROPLASTY  10/30/2011   Procedure: TOTAL KNEE ARTHROPLASTY;  Surgeon: Lorn Junes, MD;  Location: [place];  Service: Orthopedics;  Laterality: Left;      XRT     chemo, surgery     reports that she  quit smoking about 37 years ago. She has never used smokeless tobacco. She reports previous alcohol use. She reports that she does not use drugs.  Allergies  Allergen Reactions   Cefuroxime Diarrhea   Celecoxib Nausea Only   Deltasone [Prednisone] Swelling    Oral deltasone is causing swelling (can use Depo-Medrol)   Ranitidine Other (See Comments)    bloating   Spironolactone Other (See Comments)    High K   Tapazole [Methimazole] Rash   Tramadol Hcl Nausea Only   Entresto [Sacubitril-Valsartan] Other (See Comments)    Unknown reaction per pt    Family History  Problem Relation Age of Onset   Dementia Mother    Heart disease Mother    Mental retardation Mother    Hypertension Mother    Alzheimer's disease Mother    Heart attack Father    Alcohol abuse Father    Prostate cancer Brother        dx >50   Bladder Cancer Brother        dx >50   Diabetes Other    Brain cancer Maternal Aunt 23   Brain cancer Maternal Uncle 32   Lung cancer Paternal Uncle    Heart attack Maternal Grandmother    Heart attack Maternal Grandfather    Lymphoma Other 10       NH   Skin cancer Other    Colon cancer Neg Hx    Anesthesia problems Neg Hx     Prior to Admission medications   Medication Sig Start Date End Date Taking? Authorizing Provider  ALPRAZolam Duanne Moron) 0.5 MG tablet Take 1 tablet (0.5 mg total) by mouth 2 (two) times daily as needed for anxiety or sleep. 10/23/18   Plotnikov, Evie Lacks, MD  carvedilol (COREG) 3.125 MG tablet Take 2 tablets (6.25 mg total) by mouth 2 (two) times daily with a meal. 11/13/18   Burtis Junes, NP  coal tar (NEUTROGENA T-GEL) 0.5 % shampoo Apply 1 application topically once a week.     [provider]  diphenoxylate-atropine (LOMOTIL) 2.5-0.025 MG/5ML liquid Take by mouth 3 (three) times daily as needed for diarrhea or loose stools.    [provider]  ELIQUIS 2.5 MG TABS tablet TAKE ONE TABLET BY MOUTH  TWICE A DAY  **MUST CALL MD FOR APPOINTMENT FOR ADDITIONAL REFILLS 11/11/18   Bensimhon, Shaune Pascal, MD  ketoconazole (NIZORAL) 2 % shampoo Apply 1 application topically 2 (two) times a week.    [provider]  loratadine (CLARITIN) 10 MG tablet Take 1 tablet (10 mg total) by mouth daily as needed for allergies. 09/17/18   Debbe Odea, MD  OXYGEN Inhale 1.5 L into the lungs at bedtime.    [provider]  Polyethyl Glycol-Propyl Glycol (SYSTANE ULTRA PF OP) Place 2 drops into both eyes daily as needed (dry eyes).     [provider]  potassium chloride SA (K-DUR,KLOR-CON) 20 MEQ tablet Take 1 tablet (20 mEq total) by mouth daily. 11/07/18   Truitt Merle  C, NP  torsemide (DEMADEX) 20 MG tablet Take 3 tablets (60 mg total) by mouth daily. 11/13/18   Burtis Junes, NP  triamcinolone ointment (KENALOG) 0.1 % Apply 1 application topically 2 (two) times daily as needed (on affected area).     [provider]    Physical Exam: Vitals:   11/25/18 1639 11/25/18 1815 11/25/18 1857 11/25/18 1901  BP:    (!) 87/65  Pulse: (!) 109 92  88  Resp: 17 (!) 22  17  Temp:      TempSrc:      SpO2: 96% 92% 92% 95%  Weight:      Height:        Constitutional: NAD, calm, comfortable Vitals:   11/25/18 1639 11/25/18 1815 11/25/18 1857 11/25/18 1901  BP:    (!) 87/65  Pulse: (!) 109 92  88  Resp: 17 (!) 22  17  Temp:      TempSrc:      SpO2: 96% 92% 92% 95%  Weight:      Height:       Eyes: PERRL, lids and conjunctivae normal ENMT: Mucous membranes are moist. Posterior pharynx clear of any exudate or lesions.Normal dentition.  Neck: normal, supple, no masses, no thyromegaly Respiratory: clear to auscultation bilaterally, no wheezing, no crackles. Normal respiratory effort. No accessory muscle use.  Cardiovascular: Regular rate and rhythm, no murmurs / rubs / gallops. No extremity edema. 2+ pedal pulses. No carotid bruits.  Abdomen: no tenderness, no masses  palpated. No hepatosplenomegaly. Bowel sounds positive.  Musculoskeletal: no clubbing / cyanosis. No joint deformity upper and lower extremities. Good ROM, no contractures. Normal muscle tone.  Skin: Bilateral lower extremity wounds(please see nursing note for details), blanching erythema over left lower extremity with scant 1+ pitting edema bilateral lower extremities. Neurologic: CN 2-12 grossly intact. Sensation intact, DTR normal. Strength 5/5 in all 4.  Psychiatric: Normal judgment and insight. Alert and oriented x 3. Normal mood.   Labs on Admission: I have personally reviewed following labs and imaging studies  CBC: Recent Labs  Lab 11/25/18 1533  WBC 16.6*  NEUTROABS 14.7*  HGB 13.4  HCT 43.2  MCV 102.4*  PLT 935   Basic Metabolic Panel: Recent Labs  Lab 11/25/18 1533  NA 135  K 6.1*  CL 103  CO2 26  GLUCOSE 123*  BUN 66*  CREATININE 1.88*  CALCIUM 7.8*   GFR: Estimated Creatinine Clearance: 17.2 mL/min (A) (by C-G formula based on SCr of 1.88 mg/dL (H)). Liver Function Tests: Recent Labs  Lab 11/25/18 1533  AST 22  ALT 27  ALKPHOS 115  BILITOT 1.1  PROT 6.1*  ALBUMIN 3.5   No results for input(s): LIPASE, AMYLASE in the last 168 hours. No results for input(s): AMMONIA in the last 168 hours. Coagulation Profile: No results for input(s): INR, PROTIME in the last 168 hours. Cardiac Enzymes: No results for input(s): CKTOTAL, CKMB, CKMBINDEX, TROPONINI in the last 168 hours. BNP (last 3 results) Recent Labs    07/23/18 1305 07/31/18 1118 10/22/18 1140  PROBNP 8,739* 7,046* 5,759*   HbA1C: No results for input(s): HGBA1C in the last 72 hours. CBG: No results for input(s): GLUCAP in the last 168 hours. Lipid Profile: No results for input(s): CHOL, HDL, LDLCALC, TRIG, CHOLHDL, LDLDIRECT in the last 72 hours. Thyroid Function Tests: No results for input(s): TSH, T4TOTAL, FREET4, T3FREE, THYROIDAB in the last 72 hours. Anemia Panel: No results for  input(s): VITAMINB12, FOLATE, FERRITIN, TIBC, IRON,  RETICCTPCT in the last 72 hours. Urine analysis:    Component Value Date/Time   COLORURINE YELLOW 09/16/2018 1210   APPEARANCEUR HAZY (A) 09/16/2018 1210   LABSPEC 1.010 09/16/2018 1210   PHURINE 5.0 09/16/2018 1210   GLUCOSEU NEGATIVE 09/16/2018 1210   GLUCOSEU NEGATIVE 05/06/2018 1440   HGBUR NEGATIVE 09/16/2018 1210   BILIRUBINUR NEGATIVE 09/16/2018 1210   KETONESUR NEGATIVE 09/16/2018 1210   PROTEINUR NEGATIVE 09/16/2018 1210   UROBILINOGEN 0.2 05/06/2018 1440   NITRITE NEGATIVE 09/16/2018 1210   LEUKOCYTESUR MODERATE (A) 09/16/2018 1210    Radiological Exams on Admission: Dg Chest Port 1 View  Result Date: 11/25/2018 CLINICAL DATA:  Weakness and shortness of breath EXAM: PORTABLE CHEST 1 VIEW COMPARISON:  August 08, 2019 FINDINGS: There is persistent consolidation in the left lower lobe with small left pleural effusion. The right lung is clear. Heart is mildly enlarged with pulmonary vascularity normal. There is aortic atherosclerosis. No adenopathy evident. Large hiatal hernia again noted. There are surgical clips over the left breast region. There is deviation of the upper thoracic trachea, a stable finding. There is superior migration of each humeral head. IMPRESSION: Large hiatal hernia with adjacent chronic consolidation and small left pleural effusion. Right lung clear. Stable cardiac prominence. Deviation of the trachea in the upper thoracic region to the right is likely due to thyroid mass, also present on prior CT. Aortic Atherosclerosis (ICD10-I70.0). Superior migration of each humeral head is likely indicative of chronic rotator cuff tears bilaterally. Electronically Signed   By: Lowella Grip III M.D.   On: 11/25/2018 16:15    Assessment/Plan Active Problems:   Sepsis due to cellulitis [facility])   Anxiety   Essential hypertension   Chronic systolic heart failure (HCC)   Atrial fibrillation (HCC)   Sepsis secondary  to cellulitis (likely MRSA), bilateral lower extremities, failure of outpatient therapy, POA -Patient has chronic recurrent bilateral lower extremity cellulitis, was just discharged from our facility one month ago for similar episode requiring IV vancomycin after failing outpatient therapy -Had wound culture on 11/18/2018 remarkable for MRSA per report -Patient indicates she is currently on antibiotics, although not recall occasion she is currently on -Patient currently meets sepsis criteria with leukocytosis, tachypnea, tachycardia -Follow cultures, concern for MRSA bacteremia and prolonged course and persistent wounds -Continue IV vancomycin, pharmacy assistance with dosing given patient's AKI as below -She will need close follow-up with wound care -currently follows with Dr. Dellia Nims in the outpatient setting -Patient's chronic poorly healing bilateral lower extremity wounds patient may benefit from further outpatient work-up for treatment of possible peripheral vascular disease noted on chart review  AKI without history of CKD -Continue increased p.o. intake -Baseline creatinine appears to be 0.9 -last month  (currently 1.8) -Hold off on further IV fluids given ED already administered 1 L - known heart failure as below -Continue to hold diuretics as below  Lactic acidosis secondary to above  -Follow repeat per protocol - already received IV fluids  Heart failure, chronic systolic, EF 93/81% not in acute exacerbation -Patient currently appears euvolemic, will hold off on IV fluids -Hold home torsemide -given AKI as above -Would likely benefit from core measures as tolerated, patient is only on the beta-blocker and diuretics at this time. Defer to cardiology for initiation of ACE/ARB +/- spironolactone versus Entresto given her reduced ejection fraction  Hyperkalemia, mild  -Treated appropriately in the ED, follow with morning labs  A.fib, persistent chronic, without RVR, rate controlled, on  chronic anticoagulation with Eliquis -Chronic  A. fib currently on Eliquis, rate control with carvedilol 3.125 -Resume home medications once med rec verified  Anxiety -Resume home medications once med rec verified: 0.5mg  alprazolam twice daily as needed for anxiety   DVT prophylaxis: Continue home Eliquis Code Status: DNR  Family Communication: None present Disposition Plan: Patient will likely need 24 to 48 hours of additional IV antibiotics and close monitoring given AKI in the setting of sepsis -tentative disposition discharge home Consults called: None Admission status: Inpatient  [facility]  If 7PM-7AM, please contact night-coverage [email or web address] Password [email or web address]  11/25/2018, 8:01 PM

## 2018-11-25 NOTE — ED Triage Notes (Signed)
Patient states she was seen at her usual weekly wound care appointment. Patient was referred her for further evaluation and decline in condition.

## 2018-11-25 NOTE — ED Notes (Signed)
ED TO INPATIENT HANDOFF REPORT  ED Nurse Name and Phone #: [phone]  S Name/Age/Gender Mia Contreras 85 y.o. female Room/Bed: WA04/WA04  Code Status   Code Status: DNR  Home/SNF/Other Home Patient oriented to: self, place, time and situation Is this baseline? Yes   Triage Complete: Triage complete  Chief Complaint mersa; cellulitis  Triage Note Patient states she was seen at her usual weekly wound care appointment. Patient was referred her for further evaluation and decline in condition.    Allergies Allergies  Allergen Reactions  . Cefuroxime Diarrhea  . Celecoxib Nausea Only  . Deltasone [Prednisone] Swelling    Oral deltasone is causing swelling (can use Depo-Medrol)  . Ranitidine Other (See Comments)    bloating  . Spironolactone Other (See Comments)    High K  . Tapazole [Methimazole] Rash  . Tramadol Hcl Nausea Only  . Entresto [Sacubitril-Valsartan] Other (See Comments)    Unknown reaction per pt    Level of Care/Admitting Diagnosis ED Disposition    ED Disposition Condition [facility] Area: [place] [[id number]]  Level of Care: Med-Surg [16]  Diagnosis: Sepsis due to cellulitis [facility]) [[id number]]  Admitting Physician: Little Ishikawa [[id number]]  Attending Physician: Little Ishikawa [[id number]]  Estimated length of stay: past midnight tomorrow  Certification:: I certify this patient will need inpatient services for at least 2 midnights  PT Class (Do Not Modify): Inpatient [101]  PT Acc Code (Do Not Modify): Private [1]       B Medical/Surgery History Past Medical History:  Diagnosis Date  . AKI (acute kidney injury) ([place]) 01/2016  . ANEMIA-NOS 08/20/2007  . ANXIETY 03/16/2007  . BREAST CANCER, HX OF 03/16/2007  . CARPAL TUNNEL SYNDROME, RIGHT 01/11/2008  . Cataract    floaters  . CELLULITIS, LEG, RIGHT 08/20/2007  . CHF (congestive heart failure) ([place])   . Collagenous colitis   . Congestive heart  failure with right ventricular systolic dysfunction ([place]) 06/19/2018  . COPD (chronic obstructive pulmonary disease) ([place])   . DIVERTICULOSIS, COLON 08/20/2007  . Dyspnea    on exertion  . Dysrhythmia    atrial fib  . Family history of brain tumor   . Family history of lung cancer   . Family history of lymphoma   . Family history of prostate cancer   . Family history of skin cancer   . GOITER, MULTINODULAR 05/06/2010   Dr [name]  . HYPERLIPIDEMIA 08/20/2007  . HYPERTENSION 03/16/2007  . HYPERTHYROIDISM 05/23/2010  . IBS (irritable bowel syndrome)   . OSTEOARTHRITIS 03/16/2007  . OSTEOPOROSIS 08/20/2007  . Personal history of breast cancer 07/01/2018  . Pneumonia 07/2011   took abx for several weeks  . PREMATURE ATRIAL CONTRACTIONS 09/21/2010  . SCOLIOSIS 01/11/2008  . Severe tricuspid regurgitation 06/19/2018  . SKIN CANCER, HX OF 11/12/2008    R cheek 2010, L Cheek 2011 Dr. Tonia Brooms  . Thyroid nodule   . Uterine cancer ([place])   . VENOUS INSUFFICIENCY 09/05/2007   Past Surgical History:  Procedure Laterality Date  . ABDOMINAL HYSTERECTOMY    . APPENDECTOMY  1972  . BREAST LUMPECTOMY Left 2001  . BREAST LUMPECTOMY WITH RADIOACTIVE SEED LOCALIZATION Left 06/11/2018   Procedure: LEFT BREAST LUMPECTOMY WITH BRACKETED RADIOACTIVE SEED LOCALIZATION;  Surgeon: Fanny Skates, MD;  Location: [place];  Service: General;  Laterality: Left;  . CARPAL TUNNEL RELEASE    . CATARACT EXTRACTION    . EYE SURGERY     cataract  ext  . HERNIA REPAIR    . MASTECTOMY, PARTIAL Left   . SKIN CANCER EXCISION     face-Dr. Bing Plume  . TOTAL KNEE ARTHROPLASTY    . TOTAL KNEE ARTHROPLASTY Right 2010   Dr Para March  . TOTAL KNEE ARTHROPLASTY  10/30/2011   Procedure: TOTAL KNEE ARTHROPLASTY;  Surgeon: Lorn Junes, MD;  Location: [place];  Service: Orthopedics;  Laterality: Left;     . XRT     chemo, surgery     A IV Location/Drains/Wounds Patient Lines/Drains/Airways Status   Active Line/Drains/Airways     Name:   Placement date:   Placement time:   Site:   Days:   Peripheral IV 11/25/18 Right Antecubital   11/25/18    1519    Antecubital   less than 1   Peripheral IV 11/25/18 Right;Anterior Forearm   11/25/18    1541    Forearm   less than 1   Incision 10/30/11 Leg Left   10/30/11    0921     2583   Incision (Closed) 06/11/18 Breast Left   06/11/18    1006     167   Wound / Incision (Open or Dehisced) 06/07/16 Other (Comment) Leg Left Unnas boot in place, UTA   06/07/16    0253    Leg   901   Wound / Incision (Open or Dehisced) 06/07/16 Other (Comment) Leg Right Unnas boot in place, UTA   06/07/16    0253    Leg   901   Wound / Incision (Open or Dehisced) 09/16/18 Pretibial Left   09/16/18    1812    Pretibial   70   Wound / Incision (Open or Dehisced) 10/09/18 Other (Comment) Leg Left WOUND HX OF MRSA/ SEEN AT WOUND CLINIC   10/09/18    0812    Leg   47          Intake/Output Last 24 hours  Intake/Output Summary (Last 24 hours) at 11/25/2018 1829 Last data filed at 11/25/2018 1757 Gross per 24 hour  Intake 800 ml  Output -  Net 800 ml    Labs/Imaging Results for orders placed or performed during the hospital encounter of 11/25/18 (from the past 48 hour(s))  Comprehensive metabolic panel     Status: Abnormal   Collection Time: 11/25/18  3:33 PM  Result Value Ref Range   Sodium 135 135 - 145 mmol/L   Potassium 6.1 (H) 3.5 - 5.1 mmol/L   Chloride 103 98 - 111 mmol/L   CO2 26 22 - 32 mmol/L   Glucose, Bld 123 (H) 70 - 99 mg/dL   BUN 66 (H) 8 - 23 mg/dL   Creatinine, Ser 1.88 (H) 0.44 - 1.00 mg/dL   Calcium 7.8 (L) 8.9 - 10.3 mg/dL   Total Protein 6.1 (L) 6.5 - 8.1 g/dL   Albumin 3.5 3.5 - 5.0 g/dL   AST 22 15 - 41 U/L   ALT 27 0 - 44 U/L   Alkaline Phosphatase 115 38 - 126 U/L   Total Bilirubin 1.1 0.3 - 1.2 mg/dL   GFR calc non Af Amer 24 (L) >60 mL/min   GFR calc Af Amer 27 (L) >60 mL/min   Anion gap 6 5 - 15    Comment: Performed at [facility], [place]  [address], [place], [place] [address]  CBC WITH DIFFERENTIAL     Status: Abnormal   Collection Time: 11/25/18  3:33 PM  Result  Value Ref Range   WBC 16.6 (H) 4.0 - 10.5 K/uL   RBC 4.22 3.87 - 5.11 MIL/uL   Hemoglobin 13.4 12.0 - 15.0 g/dL   HCT 43.2 36.0 - 46.0 %   MCV 102.4 (H) 80.0 - 100.0 fL   MCH 31.8 26.0 - 34.0 pg   MCHC 31.0 30.0 - 36.0 g/dL   RDW 19.9 (H) 11.5 - 15.5 %   Platelets 224 150 - 400 K/uL   nRBC 0.3 (H) 0.0 - 0.2 %   Neutrophils Relative % 89 %   Neutro Abs 14.7 (H) 1.7 - 7.7 K/uL   Lymphocytes Relative 2 %   Lymphs Abs 0.3 (L) 0.7 - 4.0 K/uL   Monocytes Relative 7 %   Monocytes Absolute 1.2 (H) 0.1 - 1.0 K/uL   Eosinophils Relative 1 %   Eosinophils Absolute 0.2 0.0 - 0.5 K/uL   Basophils Relative 0 %   Basophils Absolute 0.0 0.0 - 0.1 K/uL   Immature Granulocytes 1 %   Abs Immature Granulocytes 0.22 (H) 0.00 - 0.07 K/uL    Comment: Performed at [facility], [place] [address], [place], [place] [address]  Lactic acid, plasma     Status: None   Collection Time: 11/25/18  3:34 PM  Result Value Ref Range   Lactic Acid, Venous 1.4 0.5 - 1.9 mmol/L    Comment: Performed at [facility], [place] [address], [place], [place] [address]   Dg Chest Port 1 View  Result Date: 11/25/2018 CLINICAL DATA:  Weakness and shortness of breath EXAM: PORTABLE CHEST 1 VIEW COMPARISON:  August 08, 2019 FINDINGS: There is persistent consolidation in the left lower lobe with small left pleural effusion. The right lung is clear. Heart is mildly enlarged with pulmonary vascularity normal. There is aortic atherosclerosis. No adenopathy evident. Large hiatal hernia again noted. There are surgical clips over the left breast region. There is deviation of the upper thoracic trachea, a stable finding. There is superior migration of each humeral head. IMPRESSION: Large hiatal hernia with adjacent chronic consolidation and small left pleural effusion. Right lung  clear. Stable cardiac prominence. Deviation of the trachea in the upper thoracic region to the right is likely due to thyroid mass, also present on prior CT. Aortic Atherosclerosis (ICD10-I70.0). Superior migration of each humeral head is likely indicative of chronic rotator cuff tears bilaterally. Electronically Signed   By: Lowella Grip III M.D.   On: 11/25/2018 16:15    Pending Labs Unresulted Labs (From admission, onward)    Start     Ordered   11/26/18 2595  Basic metabolic panel  Tomorrow morning,   R     11/25/18 1717   11/26/18 0500  CBC  Tomorrow morning,   R     11/25/18 1717   11/25/18 1506  Lactic acid, plasma  Now then every 2 hours,   STAT     11/25/18 1506   11/25/18 1506  Blood Culture (routine x 2)  BLOOD CULTURE X 2,   STAT     11/25/18 1506   11/25/18 1506  Urinalysis, Routine w reflex microscopic  ONCE - STAT,   STAT     11/25/18 1506          Vitals/Pain Today's Vitals   11/25/18 1535 11/25/18 1635 11/25/18 1639 11/25/18 1815  BP: 119/88     Pulse: (!) 107 98 (!) 109 92  Resp:  (!) 21 17 (!) 22  Temp:  TempSrc:      SpO2: 100% 95% 96% 92%  Weight:      Height:      PainSc:        Isolation Precautions No active isolations  Medications Medications  0.9 %  sodium chloride infusion (1,000 mLs Intravenous New Bag/Given 11/25/18 1521)  vancomycin (VANCOCIN) 500 mg in sodium chloride 0.9 % 100 mL IVPB (has no administration in time range)  vancomycin (VANCOCIN) IVPB 1000 mg/200 mL premix (0 mg Intravenous Stopped 11/25/18 1756)  sodium chloride 0.9 % bolus 500 mL (0 mLs Intravenous Stopped 11/25/18 1757)  cefTRIAXone (ROCEPHIN) 2 g in sodium chloride 0.9 % 100 mL IVPB (0 g Intravenous Stopped 11/25/18 1643)  sodium zirconium cyclosilicate (LOKELMA) packet 5 g (5 g Oral Given 11/25/18 1700)    Mobility walks with person assist Moderate fall risk   Focused Assessments intergumentary   R Recommendations: See Admitting Provider Note  Report given  to:   Additional Notes: skin

## 2018-11-25 NOTE — Progress Notes (Signed)
ED called for report at [place]. Patient arrived in [place] at 31. Alert and oriented x 4. Cellulitis LE. Pain complained. Vitals signs was taken. Room is set up. Call light is within patient's reach.

## 2018-11-25 NOTE — Telephone Encounter (Signed)
I have spoken to [place] her DPR by phone - Brigitta has been taken to the ER at [facility] for possible bacterial infection and progressive weakness/CHF - currently unable to walk or prepare her own food. Currently in the ER for evaluation. Her overall prognosis is very poor - has been very poor for quite some time - she has previously refused Hospice services despite multiple recommendations.

## 2018-11-25 NOTE — Progress Notes (Signed)
A consult was received from an ED physician for Vancomycin and Aztreonam per pharmacy dosing.  Pharmacist to investigate beta-lactam allergy. If history of intolerance, mild allergy, or documented history of use of cephalosporins, pharmacy can adjust aztreonam to ceftriaxone.  Cefuroxime intolerance reaction = diarrhea  (Aztreonam d/c'ed and Ceftriaxone order will be placed).  The patient's profile has been reviewed for ht/wt/allergies/indication/available labs.    A one time order has been placed for Vancomycin 1gm and Ceftriaxone 2gm.    Further antibiotics/pharmacy consults should be ordered by admitting physician if indicated.                       Thank you, Everette Rank, PharmD 11/25/2018  3:19 PM

## 2018-11-26 ENCOUNTER — Inpatient Hospital Stay (HOSPITAL_COMMUNITY): Payer: Medicare Other

## 2018-11-26 DIAGNOSIS — F419 Anxiety disorder, unspecified: Secondary | ICD-10-CM

## 2018-11-26 DIAGNOSIS — I482 Chronic atrial fibrillation, unspecified: Secondary | ICD-10-CM

## 2018-11-26 LAB — BASIC METABOLIC PANEL
Anion gap: 8 (ref 5–15)
Anion gap: 10 (ref 5–15)
Anion gap: 7 (ref 5–15)
BUN: 60 mg/dL — ABNORMAL HIGH (ref 8–23)
BUN: 60 mg/dL — ABNORMAL HIGH (ref 8–23)
BUN: 62 mg/dL — ABNORMAL HIGH (ref 8–23)
CO2: 20 mmol/L — ABNORMAL LOW (ref 22–32)
CO2: 18 mmol/L — ABNORMAL LOW (ref 22–32)
CO2: 21 mmol/L — ABNORMAL LOW (ref 22–32)
Calcium: 7.1 mg/dL — ABNORMAL LOW (ref 8.9–10.3)
Calcium: 7.2 mg/dL — ABNORMAL LOW (ref 8.9–10.3)
Calcium: 7.1 mg/dL — ABNORMAL LOW (ref 8.9–10.3)
Chloride: 107 mmol/L (ref 98–111)
Chloride: 108 mmol/L (ref 98–111)
Chloride: 107 mmol/L (ref 98–111)
Creatinine, Ser: 1.65 mg/dL — ABNORMAL HIGH (ref 0.44–1.00)
Creatinine, Ser: 1.55 mg/dL — ABNORMAL HIGH (ref 0.44–1.00)
Creatinine, Ser: 1.74 mg/dL — ABNORMAL HIGH (ref 0.44–1.00)
GFR calc Af Amer: 32 mL/min — ABNORMAL LOW (ref >60)
GFR calc Af Amer: 35 mL/min — ABNORMAL LOW (ref >60)
GFR calc Af Amer: 30 mL/min — ABNORMAL LOW (ref >60)
GFR calc non Af Amer: 28 mL/min — ABNORMAL LOW (ref >60)
GFR calc non Af Amer: 30 mL/min — ABNORMAL LOW (ref >60)
GFR calc non Af Amer: 26 mL/min — ABNORMAL LOW (ref >60)
Glucose, Bld: 120 mg/dL — ABNORMAL HIGH (ref 70–99)
Glucose, Bld: 103 mg/dL — ABNORMAL HIGH (ref 70–99)
Glucose, Bld: 120 mg/dL — ABNORMAL HIGH (ref 70–99)
Potassium: 6.4 mmol/L (ref 3.5–5.1)
Potassium: 6.6 mmol/L (ref 3.5–5.1)
Potassium: 6.7 mmol/L (ref 3.5–5.1)
Sodium: 135 mmol/L (ref 135–145)
Sodium: 136 mmol/L (ref 135–145)
Sodium: 135 mmol/L (ref 135–145)

## 2018-11-26 LAB — CBC
HCT: 39.2 % (ref 36.0–46.0)
Hemoglobin: 11.9 g/dL — ABNORMAL LOW (ref 12.0–15.0)
MCH: 31.9 pg (ref 26.0–34.0)
MCHC: 30.4 g/dL (ref 30.0–36.0)
MCV: 105.1 fL — ABNORMAL HIGH (ref 80.0–100.0)
Platelets: 209 10*3/uL (ref 150–400)
RBC: 3.73 MIL/uL — ABNORMAL LOW (ref 3.87–5.11)
RDW: 19.9 % — ABNORMAL HIGH (ref 11.5–15.5)
WBC: 14.1 10*3/uL — ABNORMAL HIGH (ref 4.0–10.5)
nRBC: 0.3 % — ABNORMAL HIGH (ref 0.0–0.2)

## 2018-11-26 LAB — LACTIC ACID, PLASMA: Lactic Acid, Venous: 2.2 mmol/L (ref 0.5–1.9)

## 2018-11-26 MED ORDER — COLLAGENASE 250 UNIT/GM EX OINT
TOPICAL_OINTMENT | Freq: Every day | CUTANEOUS | Status: DC
  Administered 2018-11-26 – 2018-12-02 (×7): via TOPICAL
  Filled 2018-11-26: qty 90

## 2018-11-26 MED ORDER — SODIUM CHLORIDE 0.9 % IV BOLUS
500.0000 mL | Freq: Once | INTRAVENOUS | Status: AC
  Administered 2018-11-26: 10:00:00 via INTRAVENOUS

## 2018-11-26 MED ORDER — ALBUTEROL SULFATE (2.5 MG/3ML) 0.083% IN NEBU: 2.5000 mg | INHALATION_SOLUTION | RESPIRATORY_TRACT | Status: DC | PRN

## 2018-11-26 MED ORDER — ALPRAZOLAM 0.5 MG PO TABS
0.5000 mg | ORAL_TABLET | Freq: Two times a day (BID) | ORAL | Status: DC | PRN
  Administered 2018-11-26 – 2018-11-27 (×2): 0.5 mg via ORAL
  Filled 2018-11-26 (×2): qty 1

## 2018-11-26 MED ORDER — APIXABAN 2.5 MG PO TABS
2.5000 mg | ORAL_TABLET | Freq: Two times a day (BID) | ORAL | Status: DC
  Administered 2018-11-26 – 2018-12-02 (×13): 2.5 mg via ORAL
  Filled 2018-11-26 (×13): qty 1

## 2018-11-26 MED ORDER — SODIUM CHLORIDE 0.9 % IV BOLUS
500.0000 mL | Freq: Once | INTRAVENOUS | Status: AC
  Administered 2018-11-26: 22:00:00 via INTRAVENOUS

## 2018-11-26 MED ORDER — SODIUM ZIRCONIUM CYCLOSILICATE 10 G PO PACK
10.0000 g | PACK | Freq: Three times a day (TID) | ORAL | Status: DC
  Administered 2018-11-26 – 2018-11-27 (×5): 10 g via ORAL
  Filled 2018-11-26 (×6): qty 1

## 2018-11-26 MED ORDER — ALBUTEROL SULFATE (2.5 MG/3ML) 0.083% IN NEBU: 2.5000 mg | INHALATION_SOLUTION | Freq: Four times a day (QID) | RESPIRATORY_TRACT | Status: DC

## 2018-11-26 MED ORDER — STERILE WATER FOR INJECTION IV SOLN
INTRAVENOUS | Status: AC
  Administered 2018-11-26 – 2018-11-27 (×2): via INTRAVENOUS
  Filled 2018-11-26 (×2): qty 850

## 2018-11-26 MED ORDER — ALBUMIN HUMAN 25 % IV SOLN
12.5000 g | Freq: Once | INTRAVENOUS | Status: AC
  Administered 2018-11-27: 12.5 g via INTRAVENOUS
  Filled 2018-11-26: qty 50

## 2018-11-26 MED ORDER — SODIUM ZIRCONIUM CYCLOSILICATE 10 G PO PACK
10.0000 g | PACK | Freq: Two times a day (BID) | ORAL | Status: DC
  Administered 2018-11-26: 11:00:00 10 g via ORAL
  Filled 2018-11-26: qty 1

## 2018-11-26 MED ORDER — SODIUM CHLORIDE 0.9 % IV BOLUS
250.0000 mL | Freq: Once | INTRAVENOUS | Status: AC
  Administered 2018-11-27: via INTRAVENOUS

## 2018-11-26 MED ORDER — SODIUM CHLORIDE 0.9 % IV BOLUS: 500.0000 mL | Freq: Once | INTRAVENOUS | Status: DC

## 2018-11-26 NOTE — Progress Notes (Signed)
Rectal temp of 94.0; BP; 62/53 - O2 is at 82% on 3l - paged MD for rectal temp

## 2018-11-26 NOTE — Consult Note (Signed)
[place] Nurse wound consult note: Consult conducted via technology and communication with Physiological scientist, [employer]. See photos taken by Dr. Bonner Puna in his note dated 11/26/18. Reason for Consult:Wounds on the bilateral LEs. L>R. Followed by outpatient [facility]), last seen by my partner K. Sanders on 2/19 and 10/14/18. Wounds and LE presentation in general has declined since that assessment. Referred to ED by [facility] Wound type:Trauma,infectious,vasculitis Pressure Injury POA: N/A Measurement: Left anterior LE: 4cm x 3.5cm x 0.2cm with 6-% black, 40% red tissue Right medial full thickness wounds in the inferolatral area Left LE wound with ruddy red appearance and extend nearly 20cms in length. Wound bed: As described above Drainage (amount, consistency, odor) Small to moderate amount serous exudate Periwound:erythematous, warm Dressing procedure/placement/frequency: As noted above, wounds continue to decline despite optimal therapy in outpatient wound care center and oral antibiotic support. Wounds in decline and underlying pathology exceed the scope of WOC Nursing.  Recommend Vascular consultation.  If you agree, please order/consult.  I will provide staff with conservative care orders using xeroform gauze to the RLE and  Santyl to LLE wound with xeroform placed to all other areas of tissue loss and order pressure redistribution heel boots for patient.  [place] nursing team will not follow, but will remain available to this patient, the nursing and medical teams.  Please re-consult if needed. Thanks, Maudie Flakes, MSN, RN, [place], Arther Abbott  Pager# [phone]

## 2018-11-26 NOTE — Progress Notes (Signed)
CRITICAL VALUE ALERT  Critical Value:  K+ 6.7  Date & Time Notied:  11/26/2018 @ 21:00  Provider Notified: Dr. Hilbert Bible  Orders Received/Actions taken:  Notified Nephrology

## 2018-11-26 NOTE — Consult Note (Signed)
Referring Provider: No ref. provider found Primary Care Physician:  Cassandria Anger, MD Primary Nephrologist:  Dr. Justin Mend  Reason for Consultation: Chronic renal insufficiency stage III/IV.  Hyperkalemia, metabolic acidosis.  Frequent loose bowel movements.  HPI: This is an 85 year old lady with a history of congestive heart failure with ejection fraction of 20 to 25% history of atrial fibrillation and COPD she has chronic lower extremity wounds and venous insufficiency has been managed by for cellulitis.  The wounds are nonhealing left greater than right complicated with MRSA cellulitis.  She has been followed by Dr. Dellia Nims.   On October 12, 2018 until October 15, 2018 she was admitted to the Hospital with recurrent cellulitis of lower extremity having failed doxycycline as an outpatient.  She was treated with a 7-day course of intravenous vancomycin.  She was afebrile no leukocytosis and discharged improved. She returned to the emergency room for 4 02/2019 with recurrent cellulitis of her lower extremities.  Blood pressure has been low 73/55 with an MAP of 62 measured 6 AM 11/26/2018.  She was not tachycardic and heart rate has been 89.Marland Kitchen  She has been afebrile with good O2 sats.  Blood pressure 110/60 pulse 72 temperature 98 she is in atrial fibrillation with a regular rate and rhythm O2 sats 90% 1 L nasal cannula.  Urine output 200 cc input positive about 3 L since admission  Home medications carvedilol 6.25 mg twice daily, doxycycline 100 mg twice daily, Eliquis 2.5 mg twice daily, potassium chloride 20 mEq daily, torsemide 40 mg daily  Medications in hospital.  Ceftriaxone 2 g x 1 dose 11/25/2018, vancomycin 1 g administered 11/25/2018   Eliquis 2.5 mg twice daily, Lokelma 10 g 3 times daily, sodium bicarbonate 75 cc an hour, albuterol nebulizer 4 times daily.  Sodium 136 potassium 6.6 chloride 108 CO2 18 BUN 60 creatinine 1.55 glucose 103 calcium 7.2 WBC 14.1 hemoglobin 11.9 platelets  209  Lokelma 5 g administered 11/25/2018 at 5 PM Lokelma 10 g administered 10:43 AM 11/26/2018  Rocephin 2 g administered 3:47 PM  Sodium bicarbonate 150 mEq started 3:11 PM 11/26/2018  Chest x-ray 11/25/2018 large hiatal hernia right lung clear small left pleural effusion slight deviation of the trachea upper thoracic region to the right most likely secondary to thyroid mass.  This was noted on CT scan 10/14/2018     Past Medical History:  Diagnosis Date  . AKI (acute kidney injury) ([place]) 01/2016  . ANEMIA-NOS 08/20/2007  . ANXIETY 03/16/2007  . BREAST CANCER, HX OF 03/16/2007  . CARPAL TUNNEL SYNDROME, RIGHT 01/11/2008  . Cataract    floaters  . CELLULITIS, LEG, RIGHT 08/20/2007  . CHF (congestive heart failure) ([place])   . Collagenous colitis   . Congestive heart failure with right ventricular systolic dysfunction ([place]) 06/19/2018  . COPD (chronic obstructive pulmonary disease) ([place])   . DIVERTICULOSIS, COLON 08/20/2007  . Dyspnea    on exertion  . Dysrhythmia    atrial fib  . Family history of brain tumor   . Family history of lung cancer   . Family history of lymphoma   . Family history of prostate cancer   . Family history of skin cancer   . GOITER, MULTINODULAR 05/06/2010   Dr [name]  . HYPERLIPIDEMIA 08/20/2007  . HYPERTENSION 03/16/2007  . HYPERTHYROIDISM 05/23/2010  . IBS (irritable bowel syndrome)   . OSTEOARTHRITIS 03/16/2007  . OSTEOPOROSIS 08/20/2007  . Personal history of breast cancer 07/01/2018  . Pneumonia 07/2011   took abx  for several weeks  . PREMATURE ATRIAL CONTRACTIONS 09/21/2010  . SCOLIOSIS 01/11/2008  . Severe tricuspid regurgitation 06/19/2018  . SKIN CANCER, HX OF 11/12/2008    R cheek 2010, L Cheek 2011 Dr. Tonia Brooms  . Thyroid nodule   . Uterine cancer ([place])   . VENOUS INSUFFICIENCY 09/05/2007    Past Surgical History:  Procedure Laterality Date  . ABDOMINAL HYSTERECTOMY    . APPENDECTOMY  1972  . BREAST LUMPECTOMY Left 2001  . BREAST LUMPECTOMY  WITH RADIOACTIVE SEED LOCALIZATION Left 06/11/2018   Procedure: LEFT BREAST LUMPECTOMY WITH BRACKETED RADIOACTIVE SEED LOCALIZATION;  Surgeon: Fanny Skates, MD;  Location: [place];  Service: General;  Laterality: Left;  . CARPAL TUNNEL RELEASE    . CATARACT EXTRACTION    . EYE SURGERY     cataract ext  . HERNIA REPAIR    . MASTECTOMY, PARTIAL Left   . SKIN CANCER EXCISION     face-Dr. Bing Plume  . TOTAL KNEE ARTHROPLASTY    . TOTAL KNEE ARTHROPLASTY Right 2010   Dr Para March  . TOTAL KNEE ARTHROPLASTY  10/30/2011   Procedure: TOTAL KNEE ARTHROPLASTY;  Surgeon: Lorn Junes, MD;  Location: [place];  Service: Orthopedics;  Laterality: Left;     . XRT     chemo, surgery    Prior to Admission medications   Medication Sig Start Date End Date Taking? Authorizing Provider  acetaminophen (TYLENOL) 500 MG tablet Take 500 mg by mouth at bedtime as needed.   Yes [provider]  ALPRAZolam (XANAX) 0.5 MG tablet Take 1 tablet (0.5 mg total) by mouth 2 (two) times daily as needed for anxiety or sleep. Patient taking differently: Take 0.5-1 mg by mouth 2 (two) times daily as needed for anxiety or sleep.  10/23/18  Yes Plotnikov, Evie Lacks, MD  carvedilol (COREG) 3.125 MG tablet Take 2 tablets (6.25 mg total) by mouth 2 (two) times daily with a meal. Patient taking differently: Take 3.125 mg by mouth 2 (two) times daily with a meal.  11/13/18  Yes Burtis Junes, NP  coal tar (NEUTROGENA T-GEL) 0.5 % shampoo Apply 1 application topically once a week.    Yes [provider]  doxycycline (VIBRA-TABS) 100 MG tablet Take 100 mg by mouth 2 (two) times daily.   Yes [provider]  ELIQUIS 2.5 MG TABS tablet TAKE ONE TABLET BY MOUTH TWICE A DAY  **MUST CALL MD FOR APPOINTMENT FOR ADDITIONAL REFILLS Patient taking differently: Take 2.5 mg by mouth 2 (two) times daily.  11/11/18  Yes Bensimhon, Shaune Pascal, MD  ketoconazole (NIZORAL) 2 % shampoo Apply 1 application topically 2 (two) times a  week.   Yes [provider]  OXYGEN Inhale 1.5 L into the lungs at bedtime.   Yes [provider]  Polyethyl Glycol-Propyl Glycol (SYSTANE ULTRA PF OP) Place 2 drops into both eyes daily as needed (dry eyes).    Yes [provider]  potassium chloride SA (K-DUR,KLOR-CON) 20 MEQ tablet Take 1 tablet (20 mEq total) by mouth daily. 11/07/18  Yes Burtis Junes, NP  Probiotic Product (PROBIOTIC PO) Take 1 tablet by mouth daily.   Yes [provider]  torsemide (DEMADEX) 20 MG tablet Take 3 tablets (60 mg total) by mouth daily. Patient taking differently: Take 40 mg by mouth daily.  11/13/18  Yes Burtis Junes, NP  loratadine (CLARITIN) 10 MG tablet Take 1 tablet (10 mg total) by mouth daily as needed for allergies. Patient not taking:  Reported on 11/25/2018 09/17/18   Debbe Odea, MD    Current Facility-Administered Medications  Medication Dose Route Frequency Provider Last Rate Last Dose  . albuterol (PROVENTIL) (2.5 MG/3ML) 0.083% nebulizer solution 2.5 mg  2.5 mg Nebulization QID Patrecia Pour, MD      . ALPRAZolam Duanne Moron) tablet 0.5 mg  0.5 mg Oral BID PRN Patrecia Pour, MD   0.5 mg at 11/26/18 1315  . apixaban (ELIQUIS) tablet 2.5 mg  2.5 mg Oral BID Vance Gather B, MD   2.5 mg at 11/26/18 1315  . collagenase (SANTYL) ointment   Topical Daily Patrecia Pour, MD      . HYDROcodone-acetaminophen (NORCO/VICODIN) 5-325 MG per tablet 1-2 tablet  1-2 tablet Oral Q6H PRN Patrecia Pour, MD   2 tablet at 11/26/18 1057  . sodium bicarbonate 150 mEq in sterile water 1,000 mL infusion   Intravenous Continuous Patrecia Pour, MD 75 mL/hr at 11/26/18 1511    . sodium zirconium cyclosilicate (LOKELMA) packet 10 g  10 g Oral TID Patrecia Pour, MD   10 g at 11/26/18 1509  . [START ON 11/27/2018] vancomycin (VANCOCIN) 500 mg in sodium chloride 0.9 % 100 mL IVPB  500 mg Intravenous Q36H Patrecia Pour, MD        Allergies as of 11/25/2018 - Review Complete 11/25/2018  Allergen  Reaction Noted  . Cefuroxime Diarrhea 10/16/2011  . Celecoxib Nausea Only 09/15/2009  . Deltasone [prednisone] Swelling 11/10/2015  . Ranitidine Other (See Comments) 07/05/2012  . Spironolactone Other (See Comments) 08/03/2016  . Tapazole [methimazole] Rash 06/21/2017  . Tramadol hcl Nausea Only 09/15/2009  . Doxycycline Diarrhea and Nausea Only 06/11/2018  . Entresto [sacubitril-valsartan] Other (See Comments) 09/04/2018    Family History  Problem Relation Age of Onset  . Dementia Mother   . Heart disease Mother   . Mental retardation Mother   . Hypertension Mother   . Alzheimer's disease Mother   . Heart attack Father   . Alcohol abuse Father   . Prostate cancer Brother        dx >50  . Bladder Cancer Brother        dx >50  . Diabetes Other   . Brain cancer Maternal Aunt 70  . Brain cancer Maternal Uncle 40  . Lung cancer Paternal Uncle   . Heart attack Maternal Grandmother   . Heart attack Maternal Grandfather   . Lymphoma Other 38       [facility]  . Skin cancer Other   . Colon cancer Neg Hx   . Anesthesia problems Neg Hx     Social History   Socioeconomic History  . Marital status: Single    Spouse name: Not on file  . Number of children: Not on file  . Years of education: Not on file  . Highest education level: Not on file  Occupational History  . Not on file  Social Needs  . Financial resource strain: Not on file  . Food insecurity:    Worry: Not on file    Inability: Not on file  . Transportation needs:    Medical: Not on file    Non-medical: Not on file  Tobacco Use  . Smoking status: Former Smoker    Last attempt to quit: 12/20/1980    Years since quitting: 37.9  . Smokeless tobacco: Never Used  Substance and Sexual Activity  . Alcohol use: Not Currently  . Drug use: No  . Sexual activity:  Not Currently  Lifestyle  . Physical activity:    Days per week: Not on file    Minutes per session: Not on file  . Stress: Not on file  Relationships  .  Social connections:    Talks on phone: Not on file    Gets together: Not on file    Attends religious service: Not on file    Active member of club or organization: Not on file    Attends meetings of clubs or organizations: Not on file    Relationship status: Not on file  . Intimate partner violence:    Fear of current or ex partner: Not on file    Emotionally abused: Not on file    Physically abused: Not on file    Forced sexual activity: Not on file  Other Topics Concern  . Not on file  Social History Narrative  . Not on file    Review of Systems: Gen: Denies any fever, chills, admits to thirst weakness and malaise HEENT: No visual complaints, No history of Retinopathy. Normal external appearance No Epistaxis or Sore throat. No sinusitis.   CV: Denies chest pain, angina, palpitations, syncope, orthopnea, PND, peripheral edema, and claudication. Resp: Denies dyspnea at rest, dyspnea with exercise, cough, sputum, wheezing, coughing up blood, and pleurisy. GI: Complains of some diarrhea and fecal incontinence GU : Denies urinary burning, blood in urine, urinary frequency, urinary hesitancy, nocturnal urination, and urinary incontinence.  No renal calculi. MS: Denies joint pain, limitation of movement, and swelling, stiffness, low back pain, extremity pain.  Diffuse muscle weakness Derm: Denies rash, itching, dry skin, hives, moles, warts, or unhealing ulcers.  Psych: Denies depression, anxiety, memory loss, suicidal ideation, hallucinations, paranoia, and confusion. Heme: Denies bruising, bleeding, and enlarged lymph nodes. Neuro: No headache.  No diplopia. No dysarthria.  No dysphasia.  No history of CVA.  No Seizures. No paresthesias.  No weakness. Endocrine No DM.  Thyroid mass noted on CT 10/14/2018 also noted on 12/01/2006.  No Adrenal disease.  Physical Exam: Vital signs in last 24 hours: Temp:  [97.5 F (36.4 C)-98 F (36.7 C)] 98 F (36.7 C) (04/07 1114) Pulse Rate:   [72-109] 72 (04/07 1114) Resp:  [14-22] 16 (04/07 1114) BP: (73-110)/(55-65) 110/60 (04/07 1114) SpO2:  [88 %-96 %] 92 % (04/07 1407) Last BM Date: 11/25/18 General:   Elderly weak white female Head:  Normocephalic and atraumatic. Eyes:  Sclera clear, no icterus.   Conjunctiva pink. Ears:  Normal auditory acuity. Nose:  No deformity, discharge,  or lesions. Mouth: Dry mucous membranes Neck: Enlarged thyroid.  JVP not elevated Lungs: Otherwise clear to auscultation anteriorly Heart: Irregular rate and rhythm 3/6 systolic murmur Abdomen:  Soft, nontender and nondistended. No masses, hepatosplenomegaly or hernias noted. Normal bowel sounds, without guarding, and without rebound.   Msk:  Symmetrical without gross deformities. Normal posture. Pulses:  No carotid, renal, femoral bruits. DP and PT symmetrical and equal Extremities: No real edema in lower extremities but legs wrapped but difficult to assess Neurologic:  Alert and  oriented x4;  grossly normal neurologically. Skin: Skin wounds lower extremity wrapped Cervical Nodes:  No significant cervical adenopathy. Psych:  Alert and cooperative. Normal mood and affect.  Intake/Output from previous day: 04/06 0701 - 04/07 0700 In: 2796.6 [P.O.:240; I.V.:1756.6; IV Piggyback:800] Out: -  Intake/Output this shift: Total I/O In: 243.6 [I.V.:243.6] Out: 200 [Urine:200]  Lab Results: Recent Labs    11/25/18 1533 11/26/18 0525  WBC 16.6* 14.1*  HGB  13.4 11.9*  HCT 43.2 39.2  PLT 224 209   BMET Recent Labs    11/25/18 1533 11/26/18 0525 11/26/18 1312  NA 135 135 136  K 6.1* 6.4* 6.6*  CL 103 107 108  CO2 26 20* 18*  GLUCOSE 123* 120* 103*  BUN 66* 60* 60*  CREATININE 1.88* 1.65* 1.55*  CALCIUM 7.8* 7.1* 7.2*   LFT Recent Labs    11/25/18 1533  PROT 6.1*  ALBUMIN 3.5  AST 22  ALT 27  ALKPHOS 115  BILITOT 1.1   PT/INR No results for input(s): LABPROT, INR in the last 72 hours. Hepatitis Panel No results for  input(s): HEPBSAG, HCVAB, HEPAIGM, HEPBIGM in the last 72 hours.  Studies/Results: Dg Chest Port 1 View  Result Date: 11/25/2018 CLINICAL DATA:  Weakness and shortness of breath EXAM: PORTABLE CHEST 1 VIEW COMPARISON:  August 08, 2019 FINDINGS: There is persistent consolidation in the left lower lobe with small left pleural effusion. The right lung is clear. Heart is mildly enlarged with pulmonary vascularity normal. There is aortic atherosclerosis. No adenopathy evident. Large hiatal hernia again noted. There are surgical clips over the left breast region. There is deviation of the upper thoracic trachea, a stable finding. There is superior migration of each humeral head. IMPRESSION: Large hiatal hernia with adjacent chronic consolidation and small left pleural effusion. Right lung clear. Stable cardiac prominence. Deviation of the trachea in the upper thoracic region to the right is likely due to thyroid mass, also present on prior CT. Aortic Atherosclerosis (ICD10-I70.0). Superior migration of each humeral head is likely indicative of chronic rotator cuff tears bilaterally. Electronically Signed   By: Lowella Grip III M.D.   On: 11/25/2018 16:15    Assessment/Plan:  Acute kidney injury with hypotension I would imagine this is secondary to acute tubular necrosis we will check urine electrolytes and renal ultrasound.  Renal function has improved with IV fluids.  However urine output continues to be poor.  She is not a candidate for chronic outpatient hemodialysis.  I would not expect that she will need this.  There is been no use of ACE inhibitor or ARB or nonsteroidal anti-inflammatory drugs.  I do not see any use of other outpatient antibiotic medications since 10/15/2018.  I doubt this represents acute interstitial nephritis or acute glomerulonephritis although both of these are possible in the setting of acute infection and chronic wounds.  Hyperkalemia.  Agree with conservative management with  Lokelma 10 g 3 times daily and IV bicarbonate.  We will continue to follow her potassium.  Her outpatient the potassium chloride was discontinued.  Anemia stable does not appear to be an issue at this time  COPD/emphysema continue nebulized treatment this should also help with hyperkalemia  Lower extremity wounds.  Will be cautious with the use of vancomycin patient could develop acute kidney injury suggest switching to daptomycin.  Left thyroid lobe mass  Congestive heart failure with systolic dysfunction ejection fraction of about 20%.  Atrial fibrillation with rapid ventricular rate appears to be rate controlled at this present time.  Sepsis syndrome with lactic acidosis and hypotension.  We will continue IV bicarbonate and supportive care.  Disposition I would encourage discussion about end-of-life issues.  Patient is DNR which is appropriate.   LOS: [place] @TODAY @4 :03 PM

## 2018-11-26 NOTE — Progress Notes (Signed)
PROGRESS NOTE  Mia Contreras  RAQ:[id number] DOB: 09-21-1928 DOA: 11/25/2018 PCP: Cassandria Anger, MD  Outpatient Specialists: Nephrology, Dr. Justin Mend; Cardiology, Dr. Haroldine Laws Brief Narrative: Mia Contreras is an 85 y.o. female with a history of HFrEF (EF 20-25%), AFib, COPD, and chronic lower extremity wounds and venous insufficiency who presented to [facility] on the advice of her wound care doctor due to progressive infection complicating chronic wounds despite outpatient oral antibiotics associated with increasing weakness/dependence for ADLs. She was afebrile, hypotensive. Labs included leukocytosis (WBC16.6), and AKI w/hyperkalemia (Cr 1.88, K 6.1). Blood cultures were drawn, ceftriaxone and vancomycin started for sepsis due to cellulitis which failed outpatient management. Lactic acid trended slightly up with continued hypotension treated with IVF's which are given judiciously due to CHF. BPs improving.   Assessment & Plan: Active Problems:   Anxiety   Essential hypertension   Chronic systolic heart failure (HCC)   Atrial fibrillation ([place])   Sepsis due to cellulitis ([place])  Bilateral LE nonhealing wounds L > R complicated by sepsis due to MRSA cellulitis: Wound culture 3/30 demonstrated moderate MRSA, MRSA also on previous Cx in Jan 2020. Did not make significant clinical gains on doxycycline as outpatient.  - Continue vancomycin - Monitor blood cultures. - Lactic acidosis worsened, continues to be hypotensive but mentating well. Will hold diuretics, give fluids and recheck lactate.  - WOC consulted for dressing recommendations. Has had ABI performed in 2017, with yield limited by medial calcifications.   AKI on stage III CKD with hyperkalemia: Mild acidosis, no overload or uremic symptoms.  - Suspect due to prerenal azotemia, will repeat bolus IVF's and attempt to avoid hypotension without iatrogenically overloading the patient w/Hx CHF.  - Hold home potassium supplement which she  took yesterday. Recheck K this PM after getting fluids, lokelma, given Ca gluconate and will keep on telemetry. ECG ordered as well.  - Renally dosing medications, continuing vancomycin due to concern for purulent cellulitis.   Chronic HFrEF: EF 20-25%. Followed by Dr. Haroldine Laws.  - Hold torsemide today given sepsis w/hypotension/AKI. I suspect she's not been on ACE/ARB/ARNI due to CKD  HTN, Chronic AFib:  - Continue low dose eliquis - Rate controlled, holding coreg for now, low threshold to restart if rate increases.  COPD: Previous hospitalization complicated by hypoxia, none currently. Recent CT did show significant emphysema. - Continue prn bronchodilators - MAR reports nocturnal oxygen of 1.5L qHS.   Hiatal hernia: Chronic, stable on CXR.   Left thyroid lobe mass: Noted to be present from 2008. Last TSH wnl at 1.82.  - Outpatient f/u  Anxiety:  - Continue low dose xanax prn  DVT prophylaxis: Eliquis 2.5mg  po BID Code Status: Confirmed DNR status with patient. Note hospice care has been recommended in the recent past. Note with multiple comorbidities and severity of current illness, could consider entering palliative care discussions in earnest during this admission. This was introduced as an option during the encounter today. Family Communication: None at bedside Disposition Plan: Uncertain, pending clinical progress  Consultants:   None  Procedures:   None  Antimicrobials:  Vancomycin, ceftriaxone.    Subjective: Does not feel ill, but knows this is a bad infection. Denies dyspnea, chest pain, palpitations, lightheadedness, confusion, or fever. Drainage from wounds is about the same as it has been, swelling less than usually actually.   Objective: Vitals:   11/25/18 2123 11/26/18 0630 11/26/18 0800 11/26/18 1114  BP: 90/65 (!) 73/55  110/60  Pulse:  89  72  Resp:  14 16 16   Temp: (!) 97.5 F (36.4 C)   98 F (36.7 C)  TempSrc:    Axillary  SpO2:  94% 94% 92%   Weight:      Height:        Intake/Output Summary (Last 24 hours) at 11/26/2018 1211 Last data filed at 11/26/2018 1012 Gross per 24 hour  Intake 3040.17 ml  Output 200 ml  Net 2840.17 ml   Filed Weights   11/25/18 1345  Weight: 51.7 kg    Gen: Elderly, frail female in no distress  Pulm: Non-labored breathing. Clear to auscultation bilaterally.  CV: Irreg irreg. No murmur, rub, or gallop. No JVD, Mild pedal edema. GI: Abdomen soft, non-tender, non-distended, with normoactive bowel sounds. No organomegaly or masses felt. Ext: Warm, no deformities Skin: Bilateral lower extremities with scattered superficial ulcers and slough/malodorous discharge on left > right. See pictures below.  Neuro: Alert and oriented. No focal neurological deficits. Psych: Judgement and insight appear normal. Mood & affect appropriate.    Left inferolateral leg   Right lateral leg   L > RLE wounds.  Data Reviewed: I have personally reviewed following labs and imaging studies  CBC: Recent Labs  Lab 11/25/18 1533 11/26/18 0525  WBC 16.6* 14.1*  NEUTROABS 14.7*  --   HGB 13.4 11.9*  HCT 43.2 39.2  MCV 102.4* 105.1*  PLT 224 097   Basic Metabolic Panel: Recent Labs  Lab 11/25/18 1533 11/26/18 0525  NA 135 135  K 6.1* 6.4*  CL 103 107  CO2 26 20*  GLUCOSE 123* 120*  BUN 66* 60*  CREATININE 1.88* 1.65*  CALCIUM 7.8* 7.1*   GFR: Estimated Creatinine Clearance: 19.6 mL/min (A) (by C-G formula based on SCr of 1.65 mg/dL (H)). Liver Function Tests: Recent Labs  Lab 11/25/18 1533  AST 22  ALT 27  ALKPHOS 115  BILITOT 1.1  PROT 6.1*  ALBUMIN 3.5   BNP (last 3 results) Recent Labs    07/23/18 1305 07/31/18 1118 10/22/18 1140  PROBNP 8,739* 7,046* 5,759*   Urine analysis:    Component Value Date/Time   COLORURINE YELLOW 09/16/2018 1210   APPEARANCEUR HAZY (A) 09/16/2018 1210   LABSPEC 1.010 09/16/2018 1210   PHURINE 5.0 09/16/2018 1210   GLUCOSEU NEGATIVE 09/16/2018 1210    GLUCOSEU NEGATIVE 05/06/2018 1440   HGBUR NEGATIVE 09/16/2018 1210   BILIRUBINUR NEGATIVE 09/16/2018 1210   KETONESUR NEGATIVE 09/16/2018 1210   PROTEINUR NEGATIVE 09/16/2018 1210   UROBILINOGEN 0.2 05/06/2018 1440   NITRITE NEGATIVE 09/16/2018 1210   LEUKOCYTESUR MODERATE (A) 09/16/2018 1210   Recent Results (from the past 240 hour(s))  Aerobic Culture (superficial specimen)     Status: None   Collection Time: 11/18/18 11:35 AM  Result Value Ref Range Status   Specimen Description   Final    WOUND LEG Performed at [facility], [place] [address], [place], [place] [address]    Special Requests   Final    NONE Performed at [facility], [place] [address] [place], [place] [address]    Gram Stain   Final    NO WBC SEEN RARE GRAM POSITIVE COCCI RARE GRAM POSITIVE RODS Performed at [facility], [place] [address] [place], [place] [address]    Culture   Final    MODERATE METHICILLIN RESISTANT STAPHYLOCOCCUS AUREUS AMONG MULTIPLE ORGANISMS PRESENT, NONE PREDOMINANT    Report Status 11/22/2018 FINAL  Final   Organism ID, Bacteria METHICILLIN RESISTANT STAPHYLOCOCCUS  AUREUS  Final      Susceptibility   Methicillin resistant staphylococcus aureus - MIC*    CIPROFLOXACIN >=8 RESISTANT Resistant     ERYTHROMYCIN >=8 RESISTANT Resistant     GENTAMICIN <=0.5 SENSITIVE Sensitive     OXACILLIN >=4 RESISTANT Resistant     TETRACYCLINE <=1 SENSITIVE Sensitive     VANCOMYCIN <=0.5 SENSITIVE Sensitive     TRIMETH/SULFA <=10 SENSITIVE Sensitive     CLINDAMYCIN RESISTANT Resistant     RIFAMPIN <=0.5 SENSITIVE Sensitive     Inducible Clindamycin POSITIVE Resistant     * MODERATE METHICILLIN RESISTANT STAPHYLOCOCCUS AUREUS  Blood Culture (routine x 2)     Status: None (Preliminary result)   Collection Time: 11/25/18  3:19 PM  Result Value Ref Range Status   Specimen Description   Final    BLOOD BLOOD RIGHT FOREARM Performed at [facility] [address], [place], [place] [address]    Special Requests   Final    BOTTLES DRAWN AEROBIC AND ANAEROBIC Blood Culture adequate volume Performed at [facility] [address], [place], [place] [address]    Culture   Final    NO GROWTH < 24 HOURS Performed at [facility] [address] [place], [place] [address]    Report Status PENDING  Incomplete  Blood Culture (routine x 2)     Status: None (Preliminary result)   Collection Time: 11/25/18  3:19 PM  Result Value Ref Range Status   Specimen Description   Final    BLOOD RIGHT ANTECUBITAL Performed at [facility] [address] [place], [place] [address]    Special Requests   Final    BOTTLES DRAWN AEROBIC AND ANAEROBIC Blood Culture adequate volume Performed at [facility] [address] [place], [place] [address]    Culture   Final    NO GROWTH < 24 HOURS Performed at [facility] [address], [place], [place] [address]    Report Status PENDING  Incomplete      Radiology Studies: Dg Chest Port 1 View  Result Date: 11/25/2018 CLINICAL DATA:  Weakness and shortness of breath EXAM: PORTABLE CHEST 1 VIEW COMPARISON:  August 08, 2019 FINDINGS: There is persistent consolidation in the left lower lobe with small left pleural effusion. The right lung is clear. Heart is mildly enlarged with pulmonary vascularity normal. There is aortic atherosclerosis. No adenopathy evident. Large hiatal hernia again noted. There are surgical clips over the left breast region. There is deviation of the upper thoracic trachea, a stable finding. There is superior migration of each humeral head. IMPRESSION: Large hiatal hernia with adjacent chronic consolidation and small left pleural effusion. Right lung clear. Stable cardiac prominence. Deviation of the trachea in the upper thoracic region to the right is likely due to thyroid mass, also present on prior CT. Aortic  Atherosclerosis (ICD10-I70.0). Superior migration of each humeral head is likely indicative of chronic rotator cuff tears bilaterally. Electronically Signed   By: Lowella Grip III M.D.   On: 11/25/2018 16:15    Scheduled Meds: . sodium zirconium cyclosilicate  10 g Oral BID   Continuous Infusions: . [START ON 11/27/2018] vancomycin       LOS: 1 day   Time spent: 35 minutes.  Patrecia Pour, MD [facility] [email or web address] Password [facility] 11/26/2018, 12:11 PM

## 2018-11-26 NOTE — Progress Notes (Signed)
CRITICAL VALUE ALERT  Critical Value:  K+ 6.4  Date & Time Notied:  11/26/2018 @ 6:43AM  Provider Notified: Dr. Hilbert Bible  Orders Received/Actions taken: PENDING

## 2018-11-26 NOTE — Progress Notes (Addendum)
CRITICAL VALUE ALERT  Critical Value:  K+ = 6.6; lactic acid = 2.2  Date & Time Notied:  11/26/2018 at 1440  Provider Notified: MD Bonner Puna  Orders Received/Actions taken: patient is alert and oriented x4. Waiting for new order.

## 2018-11-26 NOTE — Progress Notes (Signed)
Paged on-call MD due to low blood pressure- 534mL bolus prescribed

## 2018-11-27 DIAGNOSIS — L89152 Pressure ulcer of sacral region, stage 2: Secondary | ICD-10-CM

## 2018-11-27 DIAGNOSIS — L8990 Pressure ulcer of unspecified site, unspecified stage: Secondary | ICD-10-CM

## 2018-11-27 DIAGNOSIS — N183 Chronic kidney disease, stage 3 (moderate): Secondary | ICD-10-CM

## 2018-11-27 LAB — GLUCOSE, CAPILLARY
Glucose-Capillary: 110 mg/dL — ABNORMAL HIGH (ref 70–99)
Glucose-Capillary: 122 mg/dL — ABNORMAL HIGH (ref 70–99)
Glucose-Capillary: 142 mg/dL — ABNORMAL HIGH (ref 70–99)
Glucose-Capillary: 197 mg/dL — ABNORMAL HIGH (ref 70–99)
Glucose-Capillary: 63 mg/dL — ABNORMAL LOW (ref 70–99)
Glucose-Capillary: 74 mg/dL (ref 70–99)
Glucose-Capillary: 89 mg/dL (ref 70–99)
Glucose-Capillary: 94 mg/dL (ref 70–99)
Glucose-Capillary: 96 mg/dL (ref 70–99)

## 2018-11-27 LAB — RENAL FUNCTION PANEL
Albumin: 2.9 g/dL — ABNORMAL LOW (ref 3.5–5.0)
Albumin: 3 g/dL — ABNORMAL LOW (ref 3.5–5.0)
Anion gap: 9 (ref 5–15)
Anion gap: 10 (ref 5–15)
BUN: 60 mg/dL — ABNORMAL HIGH (ref 8–23)
BUN: 59 mg/dL — ABNORMAL HIGH (ref 8–23)
CO2: 20 mmol/L — ABNORMAL LOW (ref 22–32)
CO2: 20 mmol/L — ABNORMAL LOW (ref 22–32)
Calcium: 7.2 mg/dL — ABNORMAL LOW (ref 8.9–10.3)
Calcium: 7.5 mg/dL — ABNORMAL LOW (ref 8.9–10.3)
Chloride: 106 mmol/L (ref 98–111)
Chloride: 107 mmol/L (ref 98–111)
Creatinine, Ser: 1.9 mg/dL — ABNORMAL HIGH (ref 0.44–1.00)
Creatinine, Ser: 1.96 mg/dL — ABNORMAL HIGH (ref 0.44–1.00)
GFR calc Af Amer: 27 mL/min — ABNORMAL LOW (ref >60)
GFR calc Af Amer: 26 mL/min — ABNORMAL LOW (ref >60)
GFR calc non Af Amer: 23 mL/min — ABNORMAL LOW (ref >60)
GFR calc non Af Amer: 22 mL/min — ABNORMAL LOW (ref >60)
Glucose, Bld: 114 mg/dL — ABNORMAL HIGH (ref 70–99)
Glucose, Bld: 69 mg/dL — ABNORMAL LOW (ref 70–99)
Phosphorus: 5.1 mg/dL — ABNORMAL HIGH (ref 2.5–4.6)
Phosphorus: 5.1 mg/dL — ABNORMAL HIGH (ref 2.5–4.6)
Potassium: 7.1 mmol/L (ref 3.5–5.1)
Potassium: 5.7 mmol/L — ABNORMAL HIGH (ref 3.5–5.1)
Sodium: 135 mmol/L (ref 135–145)
Sodium: 137 mmol/L (ref 135–145)

## 2018-11-27 LAB — CBC
HCT: 41.7 % (ref 36.0–46.0)
Hemoglobin: 12.8 g/dL (ref 12.0–15.0)
MCH: 31.9 pg (ref 26.0–34.0)
MCHC: 30.7 g/dL (ref 30.0–36.0)
MCV: 104 fL — ABNORMAL HIGH (ref 80.0–100.0)
Platelets: 203 10*3/uL (ref 150–400)
RBC: 4.01 MIL/uL (ref 3.87–5.11)
RDW: 20.2 % — ABNORMAL HIGH (ref 11.5–15.5)
WBC: 12.1 10*3/uL — ABNORMAL HIGH (ref 4.0–10.5)
nRBC: 1.2 % — ABNORMAL HIGH (ref 0.0–0.2)

## 2018-11-27 MED ORDER — DEXTROSE 50 % IV SOLN
1.0000 | Freq: Once | INTRAVENOUS | Status: AC
  Administered 2018-11-27: 02:00:00 50 mL via INTRAVENOUS
  Filled 2018-11-27: qty 50

## 2018-11-27 MED ORDER — ALPRAZOLAM 1 MG PO TABS
1.0000 mg | ORAL_TABLET | ORAL | Status: DC | PRN
  Administered 2018-11-28 – 2018-12-02 (×2): 1 mg via ORAL
  Filled 2018-11-27 (×3): qty 1

## 2018-11-27 MED ORDER — DEXTROSE 50 % IV SOLN
1.0000 | Freq: Once | INTRAVENOUS | Status: AC
  Administered 2018-11-27: 08:00:00 50 mL via INTRAVENOUS
  Filled 2018-11-27: qty 50

## 2018-11-27 MED ORDER — SODIUM CHLORIDE 0.9 % IV BOLUS
500.0000 mL | Freq: Once | INTRAVENOUS | Status: AC
  Administered 2018-11-27: 02:00:00 500 mL via INTRAVENOUS

## 2018-11-27 MED ORDER — ACETAMINOPHEN 325 MG PO TABS
650.0000 mg | ORAL_TABLET | Freq: Every day | ORAL | Status: DC
  Administered 2018-11-27: 650 mg via ORAL
  Filled 2018-11-27: qty 2

## 2018-11-27 MED ORDER — SODIUM POLYSTYRENE SULFONATE 15 GM/60ML PO SUSP
30.0000 g | Freq: Once | ORAL | Status: AC
  Administered 2018-11-27: 30 g via ORAL
  Filled 2018-11-27: qty 120

## 2018-11-27 MED ORDER — INSULIN ASPART 100 UNIT/ML IV SOLN
10.0000 [IU] | Freq: Once | INTRAVENOUS | Status: AC
  Administered 2018-11-27: 10 [IU] via INTRAVENOUS

## 2018-11-27 MED ORDER — VANCOMYCIN VARIABLE DOSE PER UNSTABLE RENAL FUNCTION (PHARMACIST DOSING): Status: DC

## 2018-11-27 MED ORDER — LIP MEDEX EX OINT
TOPICAL_OINTMENT | CUTANEOUS | Status: AC
  Administered 2018-11-27: 19:00:00
  Filled 2018-11-27: qty 7

## 2018-11-27 MED ORDER — DOXYCYCLINE HYCLATE 100 MG PO TABS
100.0000 mg | ORAL_TABLET | Freq: Two times a day (BID) | ORAL | Status: DC
  Administered 2018-11-27 – 2018-12-02 (×11): 100 mg via ORAL
  Filled 2018-11-27 (×11): qty 1

## 2018-11-27 MED ORDER — CALCIUM GLUCONATE 10 % IV SOLN
1.0000 g | Freq: Once | INTRAVENOUS | Status: AC
  Administered 2018-11-27: 1 g via INTRAVENOUS
  Filled 2018-11-27: qty 10

## 2018-11-27 MED ORDER — INSULIN REGULAR NICU BOLUS VIA INFUSION
10.0000 [IU] | Freq: Once | INTRAVENOUS | Status: DC
  Filled 2018-11-27: qty 20

## 2018-11-27 MED ORDER — INSULIN ASPART 100 UNIT/ML ~~LOC~~ SOLN
10.0000 [IU] | Freq: Once | SUBCUTANEOUS | Status: AC
  Administered 2018-11-27: 10 [IU] via SUBCUTANEOUS

## 2018-11-27 MED ORDER — SODIUM CHLORIDE 0.9 % IV BOLUS
500.0000 mL | Freq: Once | INTRAVENOUS | Status: AC
  Administered 2018-11-27: 06:00:00 500 mL via INTRAVENOUS

## 2018-11-27 NOTE — Progress Notes (Addendum)
CRITICAL VALUE ALERT  Critical Value: K+ 7.1  Date & Time Notied:  11/27/2018 6:36am  Provider Notified: Dr. Hilbert Bible / Dr. Johnney Ou  Orders Received/Actions taken:  Per Dr. Johnney Ou, Repeat steps from earlier - orders have been placed

## 2018-11-27 NOTE — Progress Notes (Signed)
PROGRESS NOTE    Mia Contreras  QVZ:[id number] DOB: [birth year] DOA: 11/25/2018 PCP: Cassandria Anger, MD    Brief Narrative:  85 year old female who presented with leg edema and pain.  She does have significant past medical history for hypertension, chronic systolic heart failure with ejection fraction 20 to 30%, chronic atrial fibrillation, CKD stage 3 B, COPD with hypoxic respiratory failure.  Reported worsening lower extremity edematous pain for the last 48 hours, and treated for lower extremity cellulitis with IV antibiotic therapy vancomycin and oral Bactrim, doxycycline.  On her initial physical examination her blood pressure was 87/65, heart rate 109, respiratory rate 22, oxygen saturation 92%.  She had moist mucous membranes, lungs clear to auscultation bilaterally, heart S1-S2 present rhythm, abdomen was soft, positive lower extremity edema, positive bilateral lower extremity erythematous rash.  Sodium 135, potassium 6.1, chloride 103, bicarb 26, glucose 123, BUN 66, creatinine 1.88, white count 16.6, hemoglobin 13.4, hematocrit 43.2, platelets 224  Patient was admitted to the hospital working diagnosis of sepsis due to lower extremity cellulitis complicated by acute kidney injury   Assessment & Plan:   Active Problems:   Anxiety   Essential hypertension   Chronic systolic heart failure (HCC)   Atrial fibrillation ([place])   Sepsis due to cellulitis (HCC)   Pressure injury of skin   1. Oliguric AKI on CKD stage 3 B. Patient with worsening renal function, urine output with 350 ml over last 24 H. Serum K is 5,7 with serum bicarbonate at 20 and serum cr at 1,96. Case discussed with Nephrology, Dr. Justin Mend, patient is not candidate for renal replacement therapy. Continue K correction with sodium zirconuim. Will consult palliative care services. Patient's fluid balance is positive 6,962 since admission, no clinica signs of volume overload.   2. Bilateral lower extremity cellulitis. Wound  culture with multiple bacteria isolated, gram positive cocci and rods, moderate staph aureus (MRSA). Will dc vancomycin and will start patient on doxycycline 100 mg bid. Continue local wound care for now.   3. Chronic stable systolic heart failure. LV ejection fraction 20 to 25%. Patient hypotensive, systolic 78 to 81 mmHg, not able to tolerate after load reduction, RASS inhibition or beta blockade.   4. Chronic atrial fibrillation. Continue anticoagulation with apixaban for now, will need renal dose adjustment due to worsening renal function.   5. Stage 2, sacrum pressure ulcer. Present on admission. Continue local wound care.   DVT prophylaxis: apixaban   Code Status:  dnr  Family Communication: I spoke with patient's brother over the phone at the bedside, all questions were addressed.  Disposition Plan/ discharge barriers: pending clinical course.   Body mass index is 20.19 kg/m. Malnutrition Type:      Malnutrition Characteristics:      Nutrition Interventions:     RN Pressure Injury Documentation: Pressure Injury 11/27/18 Stage II -  Partial thickness loss of dermis presenting as a shallow open ulcer with a red, pink wound bed without slough. 1X2 - pink, moist; Right between buttocks (Active)  11/27/18 0107  Location: Sacrum  Location Orientation: Medial  Staging: Stage II -  Partial thickness loss of dermis presenting as a shallow open ulcer with a red, pink wound bed without slough.  Wound Description (Comments): 1X2 - pink, moist; Right between buttocks  Present on Admission:      Consultants:   Nephrology   Procedures:     Antimicrobials:       Subjective: Patient is very concerned about her medical condition,  worsening renal failure. Feels very anxious and scared. No dyspneal, no nausea or vomiting. Positive wound pain, to palpation.   Objective: Vitals:   11/26/18 2320 11/27/18 0158 11/27/18 0358 11/27/18 0838  BP: (!) 62/53 (!) 76/45 (!) 73/58  (!) 88/63  Pulse:  82 72 62  Resp:  11 14 20   Temp: (!) 94 F (34.4 C) 99.3 F (37.4 C) (!) 97.5 F (36.4 C) (!) 97.5 F (36.4 C)  TempSrc: Rectal Oral Oral Axillary  SpO2: (!) 82% 93% 96% 94%  Weight:      Height:        Intake/Output Summary (Last 24 hours) at 11/27/2018 1107 Last data filed at 11/27/2018 0644 Gross per 24 hour  Intake 4032.14 ml  Output 150 ml  Net 3882.14 ml   Filed Weights   11/25/18 1345  Weight: 51.7 kg    Examination:   General: deconditioned and ill looking appearing Neurology: Awake and alert, non focal  E ENT: mild pallor, no icterus, oral mucosa moist Cardiovascular: No JVD. S1-S2 present, rhythmic, no gallops, rubs, or murmurs. No lower extremity edema. Pulmonary: positive breath sounds bilaterally, no wheezing, scattered rhonchi and rales, bilaterally. Gastrointestinal. Abdomen with no organomegaly, non tender, no rebound or guarding Skin. Bilateral lower extremities wounds with dressings in place, protective boots in place. No erythema beyond dressings.  Musculoskeletal: no joint deformities     Data Reviewed: I have personally reviewed following labs and imaging studies  CBC: Recent Labs  Lab 11/25/18 1533 11/26/18 0525 11/27/18 0535  WBC 16.6* 14.1* 12.1*  NEUTROABS 14.7*  --   --   HGB 13.4 11.9* 12.8  HCT 43.2 39.2 41.7  MCV 102.4* 105.1* 104.0*  PLT 224 209 852   Basic Metabolic Panel: Recent Labs  Lab 11/26/18 0525 11/26/18 1312 11/26/18 1947 11/27/18 0535 11/27/18 1003  NA 135 136 135 135 137  K 6.4* 6.6* 6.7* 7.1* 5.7*  CL 107 108 107 106 107  CO2 20* 18* 21* 20* 20*  GLUCOSE 120* 103* 120* 114* 69*  BUN 60* 60* 62* 60* 59*  CREATININE 1.65* 1.55* 1.74* 1.90* 1.96*  CALCIUM 7.1* 7.2* 7.1* 7.2* 7.5*  PHOS  --   --   --  5.1* 5.1*   GFR: Estimated Creatinine Clearance: 16.5 mL/min (A) (by C-G formula based on SCr of 1.96 mg/dL (H)). Liver Function Tests: Recent Labs  Lab 11/25/18 1533 11/27/18 0535  11/27/18 1003  AST 22  --   --   ALT 27  --   --   ALKPHOS 115  --   --   BILITOT 1.1  --   --   PROT 6.1*  --   --   ALBUMIN 3.5 2.9* 3.0*   No results for input(s): LIPASE, AMYLASE in the last 168 hours. No results for input(s): AMMONIA in the last 168 hours. Coagulation Profile: No results for input(s): INR, PROTIME in the last 168 hours. Cardiac Enzymes: No results for input(s): CKTOTAL, CKMB, CKMBINDEX, TROPONINI in the last 168 hours. BNP (last 3 results) Recent Labs    07/23/18 1305 07/31/18 1118 10/22/18 1140  PROBNP 8,739* 7,046* 5,759*   HbA1C: No results for input(s): HGBA1C in the last 72 hours. CBG: Recent Labs  Lab 11/27/18 0201 11/27/18 0353 11/27/18 0615 11/27/18 0746 11/27/18 1008  GLUCAP 197* 142* 110* 96 74   Lipid Profile: No results for input(s): CHOL, HDL, LDLCALC, TRIG, CHOLHDL, LDLDIRECT in the last 72 hours. Thyroid Function Tests: No results for input(s): TSH,  T4TOTAL, FREET4, T3FREE, THYROIDAB in the last 72 hours. Anemia Panel: No results for input(s): VITAMINB12, FOLATE, FERRITIN, TIBC, IRON, RETICCTPCT in the last 72 hours.    Radiology Studies: I have reviewed all of the imaging during this hospital visit personally     Scheduled Meds: . apixaban  2.5 mg Oral BID  . collagenase   Topical Daily  . sodium zirconium cyclosilicate  10 g Oral TID   Continuous Infusions: . vancomycin 500 mg (11/27/18 0431)     LOS: 2 days         Gerome Apley, MD

## 2018-11-27 NOTE — Progress Notes (Signed)
Pharmacy Antibiotic Note  Mia Contreras is a 85 y.o. female admitted on 11/25/2018 with cellulitis.  PMH significant for bilateral lower extremity cellulitis and sent to ED due to worsening on outpatient antibiotics.  3/30 leg wound culture shows + MRSA (final result 11/22/2018).  In the ED, patient received Vancomycin 1gm and Ceftriaxone 2gm IV x 1 dose each.  Upon admission, Pharmacy has been consulted for Vancomycin dosing.  Plan:  With unstable renal function and no urine output, will dose vancomycin based on levels.  Obtain vancomycin level with AM labs on 4/10, about 48 hours after last dose given on 4/8  Follow renal function  Follow blood culture results and sensitivities   Height: 5\' 3"  (160 cm) Weight: 114 lb (51.7 kg) IBW/kg (Calculated) : 52.4  Temp (24hrs), Avg:97.4 F (36.3 C), Min:94 F (34.4 C), Max:99.3 F (37.4 C)  Recent Labs  Lab 11/25/18 1533 11/25/18 1534 11/25/18 1937 11/26/18 0525 11/26/18 1312 11/26/18 1947 11/27/18 0535 11/27/18 1003  WBC 16.6*  --   --  14.1*  --   --  12.1*  --   CREATININE 1.88*  --   --  1.65* 1.55* 1.74* 1.90* 1.96*  LATICACIDVEN  --  1.4 2.3*  --  2.2*  --   --   --     Estimated Creatinine Clearance: 16.5 mL/min (A) (by C-G formula based on SCr of 1.96 mg/dL (H)).    Allergies  Allergen Reactions  . Cefuroxime Diarrhea  . Celecoxib Nausea Only  . Deltasone [Prednisone] Swelling    Oral deltasone is causing swelling (can use Depo-Medrol)  . Ranitidine Other (See Comments)    bloating  . Spironolactone Other (See Comments)    High K  . Tapazole [Methimazole] Rash  . Tramadol Hcl Nausea Only  . Doxycycline Diarrhea and Nausea Only  . Entresto [Sacubitril-Valsartan] Other (See Comments)    Unknown reaction per pt    Antimicrobials this admission: 4/6 Ceftriaxone x 1 dose  4/6 Vanc >>    Dose adjustments this admission:    Microbiology results: 4/6 BCx: NGTD  Previous cultures: 3/30 wound: MRSA 1/21  Wound: MRSA  Thank you for allowing pharmacy to be a part of this patient's care.    Royetta Asal, PharmD, BCPS Pager [phone] 11/27/2018 2:24 PM

## 2018-11-27 NOTE — Progress Notes (Addendum)
Patient is receiving another 267mL bolus as well as an albumin infusion - kpad has been applied with heating blankets wrapped around patient - there is a slight change in her breathing  - condition has deteriorated drastically from last night  - very lethargic, drowsy, unable to get an accurate O2 reading due to her temperature - currently reading between 75 and 82 - patient was moved to 3L with no change. Will notify MD of new temperature in two hours per order  UPDATE 11/27/2018 @ 2:30AM Patient now has an oral temp, registering at 99.3. BP still soft, 76/45; O2 is 93% on 4L.   No urine output since around 8pm yesterday - bladder scan shows only 91ML - giving another bolus for BP

## 2018-11-27 NOTE — Consult Note (Signed)
Mia Contreras is an 85 yo woman with CHF, CKD and COPD with worsening frailty and functional status decline who is now admitted with early sepsis related to infected lower extremity wounds. She is fiercely independent and is determined to return home despite significant safety concerns. She is followed by the [facility] and [facility] who have been following her at home. She has refused hospice in the past per the documentation. She has no family except a brother who is 72 yo and lives in [place], otherwise she has a close group of church friends and a neighbor who help take care of her.  This evening I spoke with Mia Contreras and her RN. Mia Contreras is very anxious and having trouble sleeping. Also having pain in her legs.  Plan: 1. Add scheduled PM Tylenol 2. Increase Alprazolam to 1mg  q4 prn 3. Will leave hydrocodone for pain 4. Support with baseline O2 -on this at home 5. Continue Antibiotics and monitor renal function 6. DNR/ documents available  If her renal function continues to decline and her hypotension persists she may not survive to discharge- will need to see how she does over the next day or two but she is clearly hospice referral appropriate.  Will continue to follow.  Time: 35 min Greater than 50%  of this time was spent counseling and coordinating care related to the above assessment and plan.  Mia Hacker, DO Palliative Medicine

## 2018-11-27 NOTE — Progress Notes (Signed)
[facility] ROUNDING NOTE   Subjective:   This is an 85 year old lady with congestive heart failure ejection fraction of 2025% history of atrial fibrillation.  She has chronic lower extremity wounds and venous insufficiency is being managed for cellulitis.  This is been a recurrent problem.  She had some hypotension on 11/26/2018.  She has had marginal urine output.  Although with rehydration appears the creatinine has somewhat improved.  Vancomycin per pharmacy, Eliquis 2.5 mg twice daily, albuterol, Lokelma 10 g 3 times daily  Emergent administration of insulin and glucose, albuterol.  Calcium gluconate\  Administration of sodium chloride boluses amounting to 2.25 L 11/26/2019 11/27/2018  Blood pressure 88/63 pulse 62 temperature 97.5 O2 sats 94% nasal cannula 4 L  Sodium 135 potassium 7.1 chloride 106 CO2 20 BUN 60 creatinine 1.9 glucose 114 phosphorus 5.1 albumin 2.9 WBC 12.1 hemoglobin 12.8 platelets 203  There does not appear to be any recorded urine output despite administration of 6 L IV fluids since 11/26/2018  Objective:  Vital signs in last 24 hours:  Temp:  [94 F (34.4 C)-99.3 F (37.4 C)] 97.5 F (36.4 C) (04/08 0838) Pulse Rate:  [62-92] 62 (04/08 0838) Resp:  [11-20] 20 (04/08 0838) BP: (62-110)/(43-63) 88/63 (04/08 0838) SpO2:  [82 %-96 %] 94 % (04/08 0838)  Weight change:  Filed Weights   11/25/18 1345  Weight: 51.7 kg    Intake/Output: I/O last 3 completed shifts: In: 6512.3 [P.O.:1195; I.V.:3566; IV Piggyback:1751.4] Out: 350 [Urine:350]   Intake/Output this shift:  No intake/output data recorded.   Alert and oriented nondistressed CVS-regular rate and rhythm systolic murmur left sternal edge RS-no wheezes or rales ABD- BS present soft non-distended EXT- no edema   Basic Metabolic Panel: Recent Labs  Lab 11/25/18 1533 11/26/18 0525 11/26/18 1312 11/26/18 1947 11/27/18 0535  NA 135 135 136 135 135  K 6.1* 6.4* 6.6* 6.7* 7.1*  CL  103 107 108 107 106  CO2 26 20* 18* 21* 20*  GLUCOSE 123* 120* 103* 120* 114*  BUN 66* 60* 60* 62* 60*  CREATININE 1.88* 1.65* 1.55* 1.74* 1.90*  CALCIUM 7.8* 7.1* 7.2* 7.1* 7.2*  PHOS  --   --   --   --  5.1*    Liver Function Tests: Recent Labs  Lab 11/25/18 1533 11/27/18 0535  AST 22  --   ALT 27  --   ALKPHOS 115  --   BILITOT 1.1  --   PROT 6.1*  --   ALBUMIN 3.5 2.9*   No results for input(s): LIPASE, AMYLASE in the last 168 hours. No results for input(s): AMMONIA in the last 168 hours.  CBC: Recent Labs  Lab 11/25/18 1533 11/26/18 0525 11/27/18 0535  WBC 16.6* 14.1* 12.1*  NEUTROABS 14.7*  --   --   HGB 13.4 11.9* 12.8  HCT 43.2 39.2 41.7  MCV 102.4* 105.1* 104.0*  PLT 224 209 203    Cardiac Enzymes: No results for input(s): CKTOTAL, CKMB, CKMBINDEX, TROPONINI in the last 168 hours.  BNP: Invalid input(s): POCBNP  CBG: Recent Labs  Lab 11/27/18 0026 11/27/18 0201 11/27/18 0353 11/27/18 0615 11/27/18 0746  GLUCAP 122* 197* 142* 110* 96    Microbiology: Results for orders placed or performed during the hospital encounter of 11/25/18  Blood Culture (routine x 2)     Status: None (Preliminary result)   Collection Time: 11/25/18  3:19 PM  Result Value Ref Range Status   Specimen Description   Final  BLOOD BLOOD RIGHT FOREARM Performed at [facility] [address] [place], [place] [address]    Special Requests   Final    BOTTLES DRAWN AEROBIC AND ANAEROBIC Blood Culture adequate volume Performed at [facility] [address], [place], [place] [address]    Culture   Final    NO GROWTH 2 DAYS Performed at [facility] [address] [place], [place] [address]    Report Status PENDING  Incomplete  Blood Culture (routine x 2)     Status: None (Preliminary result)   Collection Time: 11/25/18  3:19 PM  Result Value Ref Range Status   Specimen Description   Final    BLOOD RIGHT  ANTECUBITAL Performed at [facility] [address] [place], [place] [address]    Special Requests   Final    BOTTLES DRAWN AEROBIC AND ANAEROBIC Blood Culture adequate volume Performed at [facility] [address], [place], [place] [address]    Culture   Final    NO GROWTH 2 DAYS Performed at [facility] [address] [place], [place] [address]    Report Status PENDING  Incomplete    Coagulation Studies: No results for input(s): LABPROT, INR in the last 72 hours.  Urinalysis: No results for input(s): COLORURINE, LABSPEC, PHURINE, GLUCOSEU, HGBUR, BILIRUBINUR, KETONESUR, PROTEINUR, UROBILINOGEN, NITRITE, LEUKOCYTESUR in the last 72 hours.  Invalid input(s): APPERANCEUR    Imaging: US Renal  Result Date: 11/26/2018 CLINICAL DATA:  Acute renal disease. EXAM: RENAL / URINARY TRACT ULTRASOUND COMPLETE COMPARISON:  Renal ultrasound 12/14/2015 FINDINGS: Right Kidney: Renal measurements: 9.4 x 4.0 x 4.1 cm = volume: 80.6 mL. Mild thinning of renal parenchyma and borderline increased echogenicity. No hydronephrosis. No focal renal mass. Left Kidney: Renal measurements: 9.5 x 4.2 x 3.7 cm = volume: 77.7 mL. Mild thinning of renal parenchyma. Borderline increased echogenicity. No hydronephrosis. No focal lesion. Bladder: Appears normal for degree of bladder distention. Small amount of ascites and bilateral pleural effusions are incidentally noted. IMPRESSION: 1. No obstructive uropathy. 2. Incidental note of small volume abdominopelvic ascites and pleural effusions. Electronically Signed   By: Keith Rake M.D.   On: 11/26/2018 20:59   Dg Chest Port 1 View  Result Date: 11/25/2018 CLINICAL DATA:  Weakness and shortness of breath EXAM: PORTABLE CHEST 1 VIEW COMPARISON:  August 08, 2019 FINDINGS: There is persistent consolidation in the left lower lobe with small left pleural effusion. The right lung is clear. Heart is mildly enlarged with  pulmonary vascularity normal. There is aortic atherosclerosis. No adenopathy evident. Large hiatal hernia again noted. There are surgical clips over the left breast region. There is deviation of the upper thoracic trachea, a stable finding. There is superior migration of each humeral head. IMPRESSION: Large hiatal hernia with adjacent chronic consolidation and small left pleural effusion. Right lung clear. Stable cardiac prominence. Deviation of the trachea in the upper thoracic region to the right is likely due to thyroid mass, also present on prior CT. Aortic Atherosclerosis (ICD10-I70.0). Superior migration of each humeral head is likely indicative of chronic rotator cuff tears bilaterally. Electronically Signed   By: Lowella Grip III M.D.   On: 11/25/2018 16:15     Medications:   . vancomycin 500 mg (11/27/18 0431)   . apixaban  2.5 mg Oral BID  . collagenase   Topical Daily  . sodium zirconium cyclosilicate  10 g Oral TID   albuterol, ALPRAZolam,  HYDROcodone-acetaminophen  Assessment/ Plan:   Acute kidney injury secondary to hypotension and shock.  This appears to be cardiogenic in origin.  I do not see any urine output recorded.  She is not using an ACE inhibitor or ARB nonsteroidal anti-inflammatory drugs.  There is no outpatient antibiotics.  Urinalysis and urine sodium pending.  No evidence of obstructive uropathy she has some abdominal pelvic ascites.  The IV bicarbonate has been discontinued.  Hyperkalemia.  Consider bowel ischemia with critically low blood pressures.  She has not a candidate for CRRT/dialysis as she is DNR.  Will need to consider hospice referral.  We will continue to manage hyperkalemia medically.  With no urine output this will be challenging.  I would favor comfort care  Anemia does not appear to be an issue at this time  COPD/emphysema continue nebulized treatments  Lower extremity wounds continues on vancomycin  Left thyroid lobe mass stable since  2008  Congestive heart failure with systolic dysfunction EF 61%  Shock possibly secondary to sepsis versus cardiogenic.  Patient is DNR appropriate discussion about end-of-life issues   LOS: 2 Sherril Croon @TODAY @10 :03 AM

## 2018-11-27 NOTE — Progress Notes (Signed)
Spoke to [facility] (Dr. Johnney Ou) - verbal orders have been put in for calcium gluconate 1gram IV; Regular Insulin 10u IV, Dextrose 50% 1 ampule and q2h CBG check for 12 hours.

## 2018-11-28 LAB — RENAL FUNCTION PANEL
Albumin: 2.5 g/dL — ABNORMAL LOW (ref 3.5–5.0)
Anion gap: 11 (ref 5–15)
BUN: 64 mg/dL — ABNORMAL HIGH (ref 8–23)
CO2: 20 mmol/L — ABNORMAL LOW (ref 22–32)
Calcium: 6.8 mg/dL — ABNORMAL LOW (ref 8.9–10.3)
Chloride: 102 mmol/L (ref 98–111)
Creatinine, Ser: 1.79 mg/dL — ABNORMAL HIGH (ref 0.44–1.00)
GFR calc Af Amer: 29 mL/min — ABNORMAL LOW (ref >60)
GFR calc non Af Amer: 25 mL/min — ABNORMAL LOW (ref >60)
Glucose, Bld: 87 mg/dL (ref 70–99)
Phosphorus: 4.1 mg/dL (ref 2.5–4.6)
Potassium: 4.3 mmol/L (ref 3.5–5.1)
Sodium: 133 mmol/L — ABNORMAL LOW (ref 135–145)

## 2018-11-28 MED ORDER — LIDOCAINE 5 % EX PTCH
2.0000 | MEDICATED_PATCH | CUTANEOUS | Status: DC
  Administered 2018-11-28 – 2018-12-02 (×5): 2 via TRANSDERMAL
  Filled 2018-11-28 (×5): qty 2

## 2018-11-28 MED ORDER — FUROSEMIDE 10 MG/ML IJ SOLN: 80.0000 mg | Freq: Every day | INTRAMUSCULAR | Status: DC | PRN

## 2018-11-28 MED ORDER — CLONAZEPAM 0.5 MG PO TABS: 0.5000 mg | ORAL_TABLET | Freq: Every day | ORAL | Status: DC

## 2018-11-28 MED ORDER — HYDROMORPHONE HCL 2 MG PO TABS
1.0000 mg | ORAL_TABLET | ORAL | Status: DC | PRN
  Administered 2018-12-01: 1 mg via ORAL
  Filled 2018-11-28: qty 1

## 2018-11-28 MED ORDER — FUROSEMIDE 10 MG/ML IJ SOLN
80.0000 mg | Freq: Two times a day (BID) | INTRAMUSCULAR | Status: DC
  Filled 2018-11-28: qty 8

## 2018-11-28 MED ORDER — ACETAMINOPHEN 325 MG PO TABS
650.0000 mg | ORAL_TABLET | Freq: Four times a day (QID) | ORAL | Status: DC
  Administered 2018-11-28 – 2018-12-02 (×10): 650 mg via ORAL
  Filled 2018-11-28 (×12): qty 2

## 2018-11-28 MED ORDER — HYDROMORPHONE HCL 2 MG PO TABS: 2.0000 mg | ORAL_TABLET | ORAL | Status: DC | PRN

## 2018-11-28 NOTE — Progress Notes (Signed)
Palliative Care Progress Note  Mia Contreras reports a difficult night mostly from pain in her legs-she reports that the prevalon boots are very uncomfortable and are preventing her from moving her lower legs-she is very worried about her lower extremity mobility and losing that what in the hospital. Mia Contreras tells me that her biggest fear is dying Mia Contreras has high levels on anxiety in general. We discussed the following goals of care:  1. Today we will focus on pain control as primary goal 2. Discontinue tele and other uncomfortable monitoring 3. Patient needs frequent check-in and reassurance 4. We dicussed the future and possible trajectories of her illness but it is difficult at this time to prognosticate. I re-introduced the concept of hospice-she does not want to go into a nursing facility.  Recs for pain control:  1. Scheduled ATC Tylenol+lidoderm to bilateral LE 2. Oral Hydromorphone PRN for breakthrough pain and at bedtime 3. Added low dose klonopin for anxiety-she has been on benzodiazepines for many years, continue alprazolam. 4.Hydromorphine prn for pain 5. Removed boots at her request 6. Wound care with palliative approach  Will follow closely.  Mia Hacker, DO Palliative Medicine

## 2018-11-28 NOTE — Progress Notes (Signed)
[facility] ROUNDING NOTE   Subjective:   This is an 85 year old lady with congestive heart failure ejection fraction of 2025% history of atrial fibrillation.  She has chronic lower extremity wounds and venous insufficiency is being managed for cellulitis.  This is been a recurrent problem.  She had some hypotension on 11/26/2018.  She has had marginal urine output.  Although with rehydration appears the creatinine has somewhat improved.  Patient appears to be more dyspneic this morning   , Eliquis 2.5 mg twice daily,   doxycycline 100 mg twice daily  Blood pressure 82/46 pulse 80 temperature 97.9 O2 sats 98% 2 L nasal cannula  Sodium 133 potassium 4.3 chloride 102 CO2 20 BUN 64 creatinine 1.79 glucose 87 albumin 2.5 phosphorus 4.1 calcium 6.8  There does not appear to be any recorded urine output.  It appears the patient may be incontinent of urine.  Positive fluid balance by 8 L.  Patient appears to be more dyspneic  Objective:  Vital signs in last 24 hours:  Temp:  [97.9 F (36.6 C)-98.2 F (36.8 C)] 97.9 F (36.6 C) (04/09 0531) Pulse Rate:  [80-90] 80 (04/09 0531) Resp:  [16-20] 16 (04/09 0531) BP: (78-82)/(46-57) 82/46 (04/09 0531) SpO2:  [94 %-98 %] 98 % (04/09 0531)  Weight change:  Filed Weights   11/25/18 1345  Weight: 51.7 kg    Intake/Output: I/O last 3 completed shifts: In: 4181.5 [P.O.:1075; I.V.:1355.1; IV Piggyback:1751.4] Out: 154 [Urine:154]   Intake/Output this shift:  No intake/output data recorded.   Alert and oriented nondistressed CVS-JVP elevated heart sounds distant RS-diminished breath sounds some wheezes and rales at bases ABD- BS present soft non-distended EXT- no edema   Basic Metabolic Panel: Recent Labs  Lab 11/26/18 1312 11/26/18 1947 11/27/18 0535 11/27/18 1003 11/28/18 0514  NA 136 135 135 137 133*  K 6.6* 6.7* 7.1* 5.7* 4.3  CL 108 107 106 107 102  CO2 18* 21* 20* 20* 20*  GLUCOSE 103* 120* 114* 69* 87  BUN  60* 62* 60* 59* 64*  CREATININE 1.55* 1.74* 1.90* 1.96* 1.79*  CALCIUM 7.2* 7.1* 7.2* 7.5* 6.8*  PHOS  --   --  5.1* 5.1* 4.1    Liver Function Tests: Recent Labs  Lab 11/25/18 1533 11/27/18 0535 11/27/18 1003 11/28/18 0514  AST 22  --   --   --   ALT 27  --   --   --   ALKPHOS 115  --   --   --   BILITOT 1.1  --   --   --   PROT 6.1*  --   --   --   ALBUMIN 3.5 2.9* 3.0* 2.5*   No results for input(s): LIPASE, AMYLASE in the last 168 hours. No results for input(s): AMMONIA in the last 168 hours.  CBC: Recent Labs  Lab 11/25/18 1533 11/26/18 0525 11/27/18 0535  WBC 16.6* 14.1* 12.1*  NEUTROABS 14.7*  --   --   HGB 13.4 11.9* 12.8  HCT 43.2 39.2 41.7  MCV 102.4* 105.1* 104.0*  PLT 224 209 203    Cardiac Enzymes: No results for input(s): CKTOTAL, CKMB, CKMBINDEX, TROPONINI in the last 168 hours.  BNP: Invalid input(s): POCBNP  CBG: Recent Labs  Lab 11/27/18 0746 11/27/18 1008 11/27/18 1207 11/27/18 1229 11/27/18 1353  GLUCAP 96 74 63* 94 64    Microbiology: Results for orders placed or performed during the hospital encounter of 11/25/18  Blood Culture (routine x 2)  Status: None (Preliminary result)   Collection Time: 11/25/18  3:19 PM  Result Value Ref Range Status   Specimen Description   Final    BLOOD BLOOD RIGHT FOREARM Performed at [facility] [address], [place], [place] [address]    Special Requests   Final    BOTTLES DRAWN AEROBIC AND ANAEROBIC Blood Culture adequate volume Performed at [facility] [address], [place], [place] [address]    Culture   Final    NO GROWTH 3 DAYS Performed at [facility], [place] [address] [place], [place] [address]    Report Status PENDING  Incomplete  Blood Culture (routine x 2)     Status: None (Preliminary result)   Collection Time: 11/25/18  3:19 PM  Result Value Ref Range Status   Specimen Description   Final    BLOOD RIGHT  ANTECUBITAL Performed at [facility] [address] [place], [place] [address]    Special Requests   Final    BOTTLES DRAWN AEROBIC AND ANAEROBIC Blood Culture adequate volume Performed at [facility] [address], [place], [place] [address]    Culture   Final    NO GROWTH 3 DAYS Performed at [facility], [place] [address], [place], [place] [address]    Report Status PENDING  Incomplete    Coagulation Studies: No results for input(s): LABPROT, INR in the last 72 hours.  Urinalysis: No results for input(s): COLORURINE, LABSPEC, PHURINE, GLUCOSEU, HGBUR, BILIRUBINUR, KETONESUR, PROTEINUR, UROBILINOGEN, NITRITE, LEUKOCYTESUR in the last 72 hours.  Invalid input(s): APPERANCEUR    Imaging: US Renal  Result Date: 11/26/2018 CLINICAL DATA:  Acute renal disease. EXAM: RENAL / URINARY TRACT ULTRASOUND COMPLETE COMPARISON:  Renal ultrasound 12/14/2015 FINDINGS: Right Kidney: Renal measurements: 9.4 x 4.0 x 4.1 cm = volume: 80.6 mL. Mild thinning of renal parenchyma and borderline increased echogenicity. No hydronephrosis. No focal renal mass. Left Kidney: Renal measurements: 9.5 x 4.2 x 3.7 cm = volume: 77.7 mL. Mild thinning of renal parenchyma. Borderline increased echogenicity. No hydronephrosis. No focal lesion. Bladder: Appears normal for degree of bladder distention. Small amount of ascites and bilateral pleural effusions are incidentally noted. IMPRESSION: 1. No obstructive uropathy. 2. Incidental note of small volume abdominopelvic ascites and pleural effusions. Electronically Signed   By: Keith Rake M.D.   On: 11/26/2018 20:59     Medications:    . acetaminophen  650 mg Oral QID  . apixaban  2.5 mg Oral BID  . clonazePAM  0.5 mg Oral Daily  . collagenase   Topical Daily  . doxycycline  100 mg Oral Q12H  . lidocaine  2 patch Transdermal Q24H   albuterol, ALPRAZolam, HYDROmorphone  Assessment/ Plan:   Acute kidney injury  secondary to hypotension and shock.  This appears to be cardiogenic in origin.  I do not see any urine output recorded.  She is not using an ACE inhibitor or ARB nonsteroidal anti-inflammatory drugs.  There is no outpatient antibiotics.  Urinalysis and urine sodium pending.  No evidence of obstructive uropathy she has some abdominal pelvic ascites.  The IV bicarbonate has been discontinued.  Hyperkalemia.  Consider bowel ischemia with critically low blood pressures.  She has not a candidate for CRRT/dialysis as she is DNR.  Will need to consider hospice referral.  We will continue to manage hyperkalemia medically.  With no urine output this will be challenging.  I would favor comfort care.  She may be incontinent of urine.  I do not see any results from bladder scans  Anemia does not appear to be an issue at this time  COPD/emphysema continue nebulized treatments  Lower extremity wounds treated with doxycycline 100 mg twice daily  Left thyroid lobe mass stable since 2008  Congestive heart failure with systolic dysfunction EF 71% we will add back diuretics.  Will start Lasix at 80 mg twice daily IV  Shock possibly secondary to sepsis versus cardiogenic.  Patient is DNR appropriate discussion about end-of-life issues appreciate assistance from Dr. Hilma Favors.   LOS: [place] @TODAY @10 :17 AM

## 2018-11-28 NOTE — Progress Notes (Addendum)
PROGRESS NOTE    SARAFINA PUTHOFF  LFY:[id number] DOB: 1929/03/25 DOA: 11/25/2018 PCP: Cassandria Anger, MD    Brief Narrative:  85 year old female who presented with leg edema and pain.  She does have significant past medical history for hypertension, chronic systolic heart failure with ejection fraction 20 to 30%, chronic atrial fibrillation, CKD stage 3 B, COPD with hypoxic respiratory failure.  Reported worsening lower extremity edematous pain for the last 48 hours, and treated for lower extremity cellulitis with IV antibiotic therapy vancomycin and oral Bactrim, doxycycline.  On her initial physical examination her blood pressure was 87/65, heart rate 109, respiratory rate 22, oxygen saturation 92%.  She had moist mucous membranes, lungs clear to auscultation bilaterally, heart S1-S2 present rhythm, abdomen was soft, positive lower extremity edema, positive bilateral lower extremity erythematous rash.  Sodium 135, potassium 6.1, chloride 103, bicarb 26, glucose 123, BUN 66, creatinine 1.88, white count 16.6, hemoglobin 13.4, hematocrit 43.2, platelets 224  Patient was admitted to the hospital working diagnosis of sepsis due to lower extremity cellulitis complicated by acute kidney injury   Assessment & Plan:   Principal Problem:   Acute kidney injury superimposed on chronic kidney disease ([facility]) Active Problems:   Anxiety   Essential hypertension   Chronic systolic heart failure ([facility])   Atrial fibrillation ([facility])   Sepsis due to cellulitis ([facility])   Pressure injury of skin   1. Oliguric AKI on CKD stage 3 B. Documented urine output is 4 ml, not clear accuracy of measurement. Patient with no dyspnea, no JVD or lower extremity edema. Her blood pressure is low, will use furosemide only as needed to prevent further hypotension. Her K is 4,3, serum bicarbonate 20, BUN 64 and serum cr 1,79. Hold sodium zirconium for now. Patient is not candidate for renal replacement therapy, palliative care  team has been consulted.   2. Bilateral lower extremity cellulitis. Wound culture with multiple bacteria isolated, gram positive cocci and rods, moderate staph aureus (MRSA). Will continue doxycycline therapy, for a total of 8 days. Today day #3 of total antibiotic therapy.    3. Chronic stable systolic heart failure. LV ejection fraction 20 to 25%. Patient hypotensive, systolic blood pressure 81 to 82 mmHg, patient will not tolerate RASS inhibition, or b blockade. Patien with end-stage heart failure.     4. Chronic atrial fibrillation. Rate controlled, telemetry has been discontinued, will continue renal dose apixaban for now.   5. Stage 2, sacrum pressure ulcer. Will continue local wound care. Added hydromorphone for pain control, will reduce dose to 1 mg as needed to prevent oversedation.   6. Toxic encephalopathy (new). Patient this am very somnolent and hypo-reactive, symptoms started after clonazepam, will continue neuro checks per unit protocol and aspiration precautions. Will hold on long acting benzodiazepines for now and will continue as needed alprazolam.   DVT prophylaxis: apixaban   Code Status:  dnr  Family Communication: No family at the bedside  Disposition Plan/ discharge barriers: pending clinical course, patient may need residential hospice at discharge.     Body mass index is 20.19 kg/m. Malnutrition Type:      Malnutrition Characteristics:      Nutrition Interventions:     RN Pressure Injury Documentation: Pressure Injury 11/27/18 Stage II -  Partial thickness loss of dermis presenting as a shallow open ulcer with a red, pink wound bed without slough. 1X2 - pink, moist; Right between buttocks (Active)  11/27/18 0107  Location: Sacrum  Location Orientation: Medial  Staging: Stage II -  Partial thickness loss of dermis presenting as a shallow open ulcer with a red, pink wound bed without slough.  Wound Description (Comments): 1X2 - pink, moist; Right  between buttocks  Present on Admission:      Consultants:   Nephrology   Palliative Care  Procedures:     Antimicrobials:   Vancomycin dc   Doxycycline    Subjective: Patient is very somnolent and hypo-reactive, very somnolent, no eye opening to pain stimuli.   Objective: Vitals:   11/27/18 1226 11/27/18 1357 11/27/18 2029 11/28/18 0531  BP: (!) 78/57 (!) 81/57 (!) 81/57 (!) 82/46  Pulse: 90 83 83 80  Resp: 20 16 16 16   Temp: 98.2 F (36.8 C) 98 F (36.7 C) 98 F (36.7 C) 97.9 F (36.6 C)  TempSrc: Oral Oral Oral Oral  SpO2: 94% 97%  98%  Weight:      Height:        Intake/Output Summary (Last 24 hours) at 11/28/2018 1113 Last data filed at 11/28/2018 0605 Gross per 24 hour  Intake 600 ml  Output 4 ml  Net 596 ml   Filed Weights   11/25/18 1345  Weight: 51.7 kg    Examination:   General: deconditioned  Neurology: somnolent and hypo-reactive, no eye or verbal response to pain stimuli,  E ENT: mild pallor, no icterus, oral mucosa moist Cardiovascular: No JVD. S1-S2 present, rhythmic, no gallops, rubs, or murmurs. No lower extremity edema. Pulmonary: positive breath sounds bilaterally, adequate air movement, no wheezing, or rhonchi, bibasilar rales. Gastrointestinal. Abdomen with, no organomegaly, non tender, no rebound or guarding Skin. Bilateral lower extremities dressings in place.  Musculoskeletal: no joint deformities     Data Reviewed: I have personally reviewed following labs and imaging studies  CBC: Recent Labs  Lab 11/25/18 1533 11/26/18 0525 11/27/18 0535  WBC 16.6* 14.1* 12.1*  NEUTROABS 14.7*  --   --   HGB 13.4 11.9* 12.8  HCT 43.2 39.2 41.7  MCV 102.4* 105.1* 104.0*  PLT 224 209 557   Basic Metabolic Panel: Recent Labs  Lab 11/26/18 1312 11/26/18 1947 11/27/18 0535 11/27/18 1003 11/28/18 0514  NA 136 135 135 137 133*  K 6.6* 6.7* 7.1* 5.7* 4.3  CL 108 107 106 107 102  CO2 18* 21* 20* 20* 20*  GLUCOSE 103* 120* 114*  69* 87  BUN 60* 62* 60* 59* 64*  CREATININE 1.55* 1.74* 1.90* 1.96* 1.79*  CALCIUM 7.2* 7.1* 7.2* 7.5* 6.8*  PHOS  --   --  5.1* 5.1* 4.1   GFR: Estimated Creatinine Clearance: 18.1 mL/min (A) (by C-G formula based on SCr of 1.79 mg/dL (H)). Liver Function Tests: Recent Labs  Lab 11/25/18 1533 11/27/18 0535 11/27/18 1003 11/28/18 0514  AST 22  --   --   --   ALT 27  --   --   --   ALKPHOS 115  --   --   --   BILITOT 1.1  --   --   --   PROT 6.1*  --   --   --   ALBUMIN 3.5 2.9* 3.0* 2.5*   No results for input(s): LIPASE, AMYLASE in the last 168 hours. No results for input(s): AMMONIA in the last 168 hours. Coagulation Profile: No results for input(s): INR, PROTIME in the last 168 hours. Cardiac Enzymes: No results for input(s): CKTOTAL, CKMB, CKMBINDEX, TROPONINI in the last 168 hours. BNP (last 3 results) Recent Labs    07/23/18  1305 07/31/18 1118 10/22/18 1140  PROBNP 8,739* 7,046* 5,759*   HbA1C: No results for input(s): HGBA1C in the last 72 hours. CBG: Recent Labs  Lab 11/27/18 0746 11/27/18 1008 11/27/18 1207 11/27/18 1229 11/27/18 1353  GLUCAP 96 74 63* 94 89   Lipid Profile: No results for input(s): CHOL, HDL, LDLCALC, TRIG, CHOLHDL, LDLDIRECT in the last 72 hours. Thyroid Function Tests: No results for input(s): TSH, T4TOTAL, FREET4, T3FREE, THYROIDAB in the last 72 hours. Anemia Panel: No results for input(s): VITAMINB12, FOLATE, FERRITIN, TIBC, IRON, RETICCTPCT in the last 72 hours.    Radiology Studies: I have reviewed all of the imaging during this hospital visit personally     Scheduled Meds:  acetaminophen  650 mg Oral QID   apixaban  2.5 mg Oral BID   clonazePAM  0.5 mg Oral Daily   collagenase   Topical Daily   doxycycline  100 mg Oral Q12H   furosemide  80 mg Intravenous BID   lidocaine  2 patch Transdermal Q24H   Continuous Infusions:   LOS: 3 days        Aron Needles Gerome Apley, MD

## 2018-11-28 NOTE — Progress Notes (Signed)
Bladder scanned pt, 214 cc at 0545. Pt then urinated 100 cc. Notified TRH/Schorr, no orders given.

## 2018-11-28 NOTE — Care Management Important Message (Signed)
Important Message  Patient Details  Name: Mia Contreras MRN: [id number] Date of Birth: 1929-01-23   Medicare Important Message Given:  Yes    Kerin Salen 11/28/2018, 11:23 [place] Message  Patient Details  Name: Mia Contreras MRN: [id number] Date of Birth: 29-Mar-1929   Medicare Important Message Given:  Yes    Kerin Salen 11/28/2018, 11:23 AM

## 2018-11-28 NOTE — Progress Notes (Addendum)
Chaplain rounding on pt due to spiritual care consult from palliative.  We also received voicemail referral from member of Mia Contreras's church, [employer]. .    Mia Contreras was asleep at time of chaplain rounding.  Attempted to wake, but pt was sleeping soundly.    Will follow up to make contact for support needs.  We understand that pt has brother (90y).  We will work to facilitate virtual visits with this brother during Mia Contreras's admission with [name].    Currently waiting to follow up with this church member [place] until Mia Contreras gives affirmation of this, as they are not on her contact list.      Jerene Pitch, MDiv, [facility]

## 2018-11-29 DIAGNOSIS — N189 Chronic kidney disease, unspecified: Secondary | ICD-10-CM

## 2018-11-29 LAB — RENAL FUNCTION PANEL
Albumin: 2.6 g/dL — ABNORMAL LOW (ref 3.5–5.0)
Anion gap: 11 (ref 5–15)
BUN: 64 mg/dL — ABNORMAL HIGH (ref 8–23)
CO2: 22 mmol/L (ref 22–32)
Calcium: 6.9 mg/dL — ABNORMAL LOW (ref 8.9–10.3)
Chloride: 104 mmol/L (ref 98–111)
Creatinine, Ser: 1.46 mg/dL — ABNORMAL HIGH (ref 0.44–1.00)
GFR calc Af Amer: 37 mL/min — ABNORMAL LOW (ref >60)
GFR calc non Af Amer: 32 mL/min — ABNORMAL LOW (ref >60)
Glucose, Bld: 103 mg/dL — ABNORMAL HIGH (ref 70–99)
Phosphorus: 3.6 mg/dL (ref 2.5–4.6)
Potassium: 3.6 mmol/L (ref 3.5–5.1)
Sodium: 137 mmol/L (ref 135–145)

## 2018-11-29 NOTE — Progress Notes (Signed)
PROGRESS NOTE    Mia Contreras  TXM:[id number] DOB: 08-08-1929 DOA: 11/25/2018 PCP: Cassandria Anger, MD    Brief Narrative:  85 year old female who presented with leg edema and pain. She does have significant past medical history for hypertension, chronic systolic heart failure with ejection fraction 20 to 30%, chronic atrial fibrillation, CKD stage 3 B,COPD with hypoxic respiratory failure. Reported worsening lower extremity edematous pain for the last 48 hours, and treated for lower extremity cellulitis with IV antibiotic therapy vancomycin and oral Bactrim,doxycycline. On her initial physical examination her blood pressure was 87/65, heart rate 109, respiratory rate 22, oxygen saturation 92%. She had moist mucous membranes, lungs clear to auscultation bilaterally, heart S1-S2 present rhythm, abdomen was soft, positive lower extremity edema, positive bilateral lower extremity erythematous rash. Sodium 135, potassium 6.1, chloride 103, bicarb 26, glucose 123, BUN 66, creatinine 1.88, white count 16.6, hemoglobin 13.4, hematocrit 43.2, platelets 224  Patient was admitted to the hospital working diagnosis of sepsis due to lower extremity cellulitis complicated by acute kidney injury   Assessment & Plan:   Principal Problem:   Acute kidney injury superimposed on chronic kidney disease ([place]) Active Problems:   Anxiety   Essential hypertension   Chronic systolic heart failure (HCC)   Atrial fibrillation ([place])   Sepsis due to cellulitis (HCC)   Pressure injury of skin  1. Oliguric AKI on CKD stage 3 B. This am patient with no edema, no JVD or dyspnea. Will continue to hold on furosemide for now. Her serum cr today is 1,46 from 1.79, her K is 3,6 and serum bicarbonate is 22. Will continue close follow up of renal function and electrolytes. Documented urine output is 100 ml/ 24 H. Patient is not candidate for renal replacement therapy.   2. Bilateral lower extremity cellulitis.  Wound culture with multiple bacteria isolated, gram positive cocci and rods, moderate staph aureus (MRSA). Continue doxycycline therapy, #4/8 of total antibiotic therapy. Continue local wound care. Today leg pain is mild to moderate.   3. Chronic stable systolic heart failure. LV ejection fraction 20 to 25%. This am blood pressure is 90/62 with a MAP of 72, unable to use afterload reduction or b blockade due to risk of worsening hypotension. Per heart failure clinic outpatient notes (personally reviewed), advanced heart failure, with very poor prognosis. She has been on carvedilol as outpatient.   4. Chronic atrial fibrillation. Continue anticoagulation with apixaban, her heart rate has been controlled. Now is off telemetry.   5. Stage 2, sacrum pressure ulcer. Local wound care.Out of bed to chair TID and ambulation with physical therapy.   6. Toxic encephalopathy (new). This am patient is more awake and alert, will continue to hold on clonazepam, to prevent oversedation. Will continue as needed alprazolam. This am patient reports no anxiety.   DVT prophylaxis:apixaban Code Status:dnr Family Communication:No family at the bedside  Disposition Plan/ discharge barriers:Patient is agreeing for SNF at discharge, she has been in SNF before after knee surgery. At home uses walker for ambulation.   Body mass index is 20.19 kg/m. Malnutrition Type:      Malnutrition Characteristics:      Nutrition Interventions:     RN Pressure Injury Documentation: Pressure Injury 11/27/18 Stage II -  Partial thickness loss of dermis presenting as a shallow open ulcer with a red, pink wound bed without slough. 1X2 - pink, moist; Right between buttocks (Active)  11/27/18 0107  Location: Sacrum  Location Orientation: Medial  Staging: Stage II -  Partial thickness loss of dermis presenting as a shallow open ulcer with a red, pink wound bed without slough.  Wound Description (Comments): 1X2 -  pink, moist; Right between buttocks  Present on Admission:      Consultants:   Nephrology   Palliative Care   Procedures:     Antimicrobials:   Vancomycin  Doxycycline     Subjective: Patient is more awake and reactive this am, tolerating po well with no nausea or vomiting. No significant anxiety or pain, no dyspnea or chest pain. At home uses walker and she agrees for SNF if needed.   Objective: Vitals:   11/28/18 0531 11/28/18 1431 11/28/18 2125 11/29/18 0546  BP: (!) 82/46 (!) 79/55 (!) 89/59 90/62  Pulse: 80 91 66 89  Resp: 16 20 19 16   Temp: 97.9 F (36.6 C) (!) 97.4 F (36.3 C) 97.7 F (36.5 C) (!) 97.3 F (36.3 C)  TempSrc: Oral Oral Oral Oral  SpO2: 98% (!) 87% 90% 93%  Weight:      Height:        Intake/Output Summary (Last 24 hours) at 11/29/2018 8841 Last data filed at 11/29/2018 0500 Gross per 24 hour  Intake 155 ml  Output 100 ml  Net 55 ml   Filed Weights   11/25/18 1345  Weight: 51.7 kg    Examination:   General: deconditioned and ill looking appearing  Neurology: Awake and alert, non focal  E ENT: mild pallor, no icterus, oral mucosa moist Cardiovascular: No JVD. S1-S2 present, rhythmic, no gallops, rubs, or murmurs. No lower extremity edema. Pulmonary: positive breath sounds bilaterally, adequate air movement, no wheezing, rhonchi or rales. Gastrointestinal. Abdomen fwith no organomegaly, non tender, no rebound or guarding Skin. Bilateral legs dressings in place.  Musculoskeletal: no joint deformities     Data Reviewed: I have personally reviewed following labs and imaging studies  CBC: Recent Labs  Lab 11/25/18 1533 11/26/18 0525 11/27/18 0535  WBC 16.6* 14.1* 12.1*  NEUTROABS 14.7*  --   --   HGB 13.4 11.9* 12.8  HCT 43.2 39.2 41.7  MCV 102.4* 105.1* 104.0*  PLT 224 209 660   Basic Metabolic Panel: Recent Labs  Lab 11/26/18 1947 11/27/18 0535 11/27/18 1003 11/28/18 0514 11/29/18 0516  NA 135 135 137 133* 137   K 6.7* 7.1* 5.7* 4.3 3.6  CL 107 106 107 102 104  CO2 21* 20* 20* 20* 22  GLUCOSE 120* 114* 69* 87 103*  BUN 62* 60* 59* 64* 64*  CREATININE 1.74* 1.90* 1.96* 1.79* 1.46*  CALCIUM 7.1* 7.2* 7.5* 6.8* 6.9*  PHOS  --  5.1* 5.1* 4.1 3.6   GFR: Estimated Creatinine Clearance: 22.2 mL/min (A) (by C-G formula based on SCr of 1.46 mg/dL (H)). Liver Function Tests: Recent Labs  Lab 11/25/18 1533 11/27/18 0535 11/27/18 1003 11/28/18 0514 11/29/18 0516  AST 22  --   --   --   --   ALT 27  --   --   --   --   ALKPHOS 115  --   --   --   --   BILITOT 1.1  --   --   --   --   PROT 6.1*  --   --   --   --   ALBUMIN 3.5 2.9* 3.0* 2.5* 2.6*   No results for input(s): LIPASE, AMYLASE in the last 168 hours. No results for input(s): AMMONIA in the last 168 hours. Coagulation Profile: No results for input(s):  INR, PROTIME in the last 168 hours. Cardiac Enzymes: No results for input(s): CKTOTAL, CKMB, CKMBINDEX, TROPONINI in the last 168 hours. BNP (last 3 results) Recent Labs    07/23/18 1305 07/31/18 1118 10/22/18 1140  PROBNP 8,739* 7,046* 5,759*   HbA1C: No results for input(s): HGBA1C in the last 72 hours. CBG: Recent Labs  Lab 11/27/18 0746 11/27/18 1008 11/27/18 1207 11/27/18 1229 11/27/18 1353  GLUCAP 96 74 63* 94 89   Lipid Profile: No results for input(s): CHOL, HDL, LDLCALC, TRIG, CHOLHDL, LDLDIRECT in the last 72 hours. Thyroid Function Tests: No results for input(s): TSH, T4TOTAL, FREET4, T3FREE, THYROIDAB in the last 72 hours. Anemia Panel: No results for input(s): VITAMINB12, FOLATE, FERRITIN, TIBC, IRON, RETICCTPCT in the last 72 hours.    Radiology Studies: I have reviewed all of the imaging during this hospital visit personally     Scheduled Meds: . acetaminophen  650 mg Oral QID  . apixaban  2.5 mg Oral BID  . collagenase   Topical Daily  . doxycycline  100 mg Oral Q12H  . lidocaine  2 patch Transdermal Q24H   Continuous Infusions:   LOS: 4  days        Vastie Douty Gerome Apley, MD

## 2018-11-29 NOTE — Progress Notes (Signed)
   11/29/18 1439  Clinical Encounter Type  Visited With Patient  Visit Type Initial;Social support  Referral From Palliative care team  Consult/Referral To Chaplain  The chaplain responded to PMT consult for Pt. spiritual care.  The chaplain introduced herself to the Pt. RN-Bableen before entering the Pt. room. The Pt. who prefers to be called "Mia Contreras" was sitting up in her chair after PT.  The chaplain connected the RN to the Pt.'s request to talk to the Pt. Brother-Frank.  The Pt. shared with the chaplain, her church friend-Levina left a text message. The Pt. plans to return the text.  In between the Pt.phone calls with her brother, the chaplain heard the Pt. say she was afraid of dying and lonely.  The chaplain understands the Pt. is connected to a [employer].  The Pt. accepted the chaplain's invitation for a return spiritual care visit.  The chaplain will re-visit the topic of the Pt.'s health and spirituality at a F/U visit.

## 2018-11-29 NOTE — Evaluation (Signed)
Physical Therapy Evaluation Patient Details Name: QUINLYN TEP MRN: [id number] DOB: 10-06-1928 Today's Date: 11/29/2018   History of Present Illness  85 yo female admitted with AKI. Hx of CHF, A fib, HTN, breast cancer, CKD, recurrent cellullitis and ulcers on LEs, MRSA, COPD  Clinical Impression  On eval, pt required Max assist for mobility. She required significant assistance to stand and transfer to recliner. Pt was unable to take any ambulatory steps 2* weakness, fatigue, dyspnea, and pain. At this time, recommendation is for SNF for continued rehab. Will follow during hospital stay     Follow Up Recommendations SNF    Equipment Recommendations  (TBD at next venue)    Recommendations for Other Services       Precautions / Restrictions Precautions Precautions: Fall Precaution Comments: monitor O2; bil LE wounds Restrictions Weight Bearing Restrictions: No      Mobility  Bed Mobility Overal bed mobility: Needs Assistance Bed Mobility: Supine to Sit     Supine to sit: HOB elevated;Max assist     General bed mobility comments: Assist for trunk and bil LEs. Utilized bepad for scooting, positioning. Increased time.   Transfers Overall transfer level: Needs assistance Equipment used: Rolling walker (2 wheeled);None Transfers: Sit to/from [facility] Sit to Stand: Mod assist;From elevated surface Stand pivot: Max assist         General transfer comment: Sit to stand x 3 (2 stands with RW, 1 stand using "bear hug" technique. Pt able to stand for ~10-15 seconds before needing to sit down 2* weakness, fatigue, dyspnea. Stand pivot using "bear hug" technique to pivot to recliner. LE buckling noted during pivot.   Ambulation/Gait             General Gait Details: Pt unable to take any ambulatory steps with the RW  Stairs            Wheelchair Mobility    Modified Rankin (Stroke Patients Only)       Balance Overall balance assessment:  Needs assistance         Standing balance support: Bilateral upper extremity supported Standing balance-Leahy Scale: Poor                               Pertinent Vitals/Pain Pain Assessment: 0-10 Pain Score: 4  Pain Location: bil LEs Pain Descriptors / Indicators: Aching;Sore Pain Intervention(s): Limited activity within patient's tolerance;Repositioned    Home Living Family/patient expects to be discharged to:: Unsure Living Arrangements: Alone Available Help at Discharge: Friend(s);Neighbor;Available PRN/intermittently Type of Home: House(townhome) Home Access: Stairs to enter Entrance Stairs-Rails: None Entrance Stairs-Number of Steps: 1 Home Layout: One level Home Equipment: Grab bars - tub/shower;Walker - 2 wheels;Walker - 4 wheels      Prior Function Level of Independence: Independent with assistive device(s)         Comments: pt reports she is mod I with RW at home, neighbor/friend drives her to appts     Hand Dominance        Extremity/Trunk Assessment   Upper Extremity Assessment Upper Extremity Assessment: Generalized weakness(limited ROM bil UEs)    Lower Extremity Assessment Lower Extremity Assessment: Generalized weakness(wounds and dressing bil lower leg)    Cervical / Trunk Assessment Cervical / Trunk Assessment: Kyphotic  Communication   Communication: No difficulties  Cognition Arousal/Alertness: Awake/alert Behavior During Therapy: WFL for tasks assessed/performed Overall Cognitive Status: Within Functional Limits for tasks assessed  General Comments      Exercises     Assessment/Plan    PT Assessment Patient needs continued PT services  PT Problem List Decreased strength;Decreased balance;Decreased mobility;Decreased activity tolerance;Decreased range of motion;Decreased knowledge of use of DME;Pain;Decreased skin integrity       PT Treatment Interventions  DME instruction;Gait training;Therapeutic activities;Functional mobility training;Balance training;Patient/family education;Therapeutic exercise    PT Goals (Current goals can be found in the Care Plan section)  Acute Rehab PT Goals Patient Stated Goal: to regain strength PT Goal Formulation: With patient Time For Goal Achievement: 12/13/18 Potential to Achieve Goals: Fair    Frequency Min 3X/week   Barriers to discharge        Co-evaluation               AM-PAC PT "6 Clicks" Mobility  Outcome Measure Help needed turning from your back to your side while in a flat bed without using bedrails?: A Lot Help needed moving from lying on your back to sitting on the side of a flat bed without using bedrails?: A Lot Help needed moving to and from a bed to a chair (including a wheelchair)?: A Lot Help needed standing up from a chair using your arms (e.g., wheelchair or bedside chair)?: A Lot Help needed to walk in hospital room?: Total Help needed climbing 3-5 steps with a railing? : Total 6 Click Score: 10    End of Session Equipment Utilized During Treatment: Gait belt Activity Tolerance: Patient limited by fatigue;Patient limited by pain Patient left: in chair;with call bell/phone within reach Nurse Communication: Mobility status(need for +2 assistance) PT Visit Diagnosis: Muscle weakness (generalized) (M62.81);Difficulty in walking, not elsewhere classified (R26.2);Other abnormalities of gait and mobility (R26.89)    Time: 7824-2353 PT Time Calculation (min) (ACUTE ONLY): 34 min   Charges:   PT Evaluation $PT Eval Moderate Complexity: 1 Mod PT Treatments $Therapeutic Activity: 8-22 mins         Weston Anna, PT Acute Rehabilitation Services Pager: [phone] Office: [phone]

## 2018-11-29 NOTE — Progress Notes (Signed)
[facility] ROUNDING NOTE   Subjective:   This is an 85 year old lady with congestive heart failure ejection fraction of 2025% history of atrial fibrillation.  She has chronic lower extremity wounds and venous insufficiency is being managed for cellulitis.  This is been a recurrent problem.  She had some hypotension on 11/26/2018.  She has had marginal urine output.  Although with rehydration appears the creatinine has somewhat improved.  Appears to be more oliguric.    Eliquis 2.5 mg twice daily,   doxycycline 100 mg twice daily  Blood pressure 89/63 pulse 81 temperature 97.6 O2 sats 90% nasal cannula 4 L  Sodium 137 potassium 3.6 chloride 104 CO2 22 BUN 64 creatinine 1.46 glucose 103 phosphorus 3.6  There does not appear to be any recorded urine output.  It appears the patient may be incontinent of urine.  Positive fluid balance by 8 L.  IV fluids have been discontinued patient appears to be more dyspneic  Objective:  Vital signs in last 24 hours:  Temp:  [97.3 F (36.3 C)-97.7 F (36.5 C)] 97.6 F (36.4 C) (04/10 1250) Pulse Rate:  [66-89] 81 (04/10 1250) Resp:  [16-19] 18 (04/10 1250) BP: (89-90)/(59-63) 89/63 (04/10 1250) SpO2:  [90 %-93 %] 90 % (04/10 1250)  Weight change:  Filed Weights   11/25/18 1345  Weight: 51.7 kg    Intake/Output: I/O last 3 completed shifts: In: 275 [P.O.:275] Out: 104 [YWVPX:106]   Intake/Output this shift:  Total I/O In: 360 [P.O.:360] Out: 100 [Urine:100]   Alert and oriented nondistressed CVS-JVP elevated heart sounds distant RS-diminished breath sounds some wheezes and rales at bases ABD- BS present soft non-distended EXT- no edema   Basic Metabolic Panel: Recent Labs  Lab 11/26/18 1947 11/27/18 0535 11/27/18 1003 11/28/18 0514 11/29/18 0516  NA 135 135 137 133* 137  K 6.7* 7.1* 5.7* 4.3 3.6  CL 107 106 107 102 104  CO2 21* 20* 20* 20* 22  GLUCOSE 120* 114* 69* 87 103*  BUN 62* 60* 59* 64* 64*  CREATININE  1.74* 1.90* 1.96* 1.79* 1.46*  CALCIUM 7.1* 7.2* 7.5* 6.8* 6.9*  PHOS  --  5.1* 5.1* 4.1 3.6    Liver Function Tests: Recent Labs  Lab 11/25/18 1533 11/27/18 0535 11/27/18 1003 11/28/18 0514 11/29/18 0516  AST 22  --   --   --   --   ALT 27  --   --   --   --   ALKPHOS 115  --   --   --   --   BILITOT 1.1  --   --   --   --   PROT 6.1*  --   --   --   --   ALBUMIN 3.5 2.9* 3.0* 2.5* 2.6*   No results for input(s): LIPASE, AMYLASE in the last 168 hours. No results for input(s): AMMONIA in the last 168 hours.  CBC: Recent Labs  Lab 11/25/18 1533 11/26/18 0525 11/27/18 0535  WBC 16.6* 14.1* 12.1*  NEUTROABS 14.7*  --   --   HGB 13.4 11.9* 12.8  HCT 43.2 39.2 41.7  MCV 102.4* 105.1* 104.0*  PLT 224 209 203    Cardiac Enzymes: No results for input(s): CKTOTAL, CKMB, CKMBINDEX, TROPONINI in the last 168 hours.  BNP: Invalid input(s): POCBNP  CBG: Recent Labs  Lab 11/27/18 0746 11/27/18 1008 11/27/18 1207 11/27/18 1229 11/27/18 1353  GLUCAP 96 74 63* 94 89    Microbiology: Results for orders placed or  performed during the hospital encounter of 11/25/18  Blood Culture (routine x 2)     Status: None (Preliminary result)   Collection Time: 11/25/18  3:19 PM  Result Value Ref Range Status   Specimen Description   Final    BLOOD BLOOD RIGHT FOREARM Performed at [facility] [address], [place], [place] [address]    Special Requests   Final    BOTTLES DRAWN AEROBIC AND ANAEROBIC Blood Culture adequate volume Performed at [facility] [address], [place], [place] [address]    Culture   Final    NO GROWTH 4 DAYS Performed at [facility], [place] [address] [place], [place] [address]    Report Status PENDING  Incomplete  Blood Culture (routine x 2)     Status: None (Preliminary result)   Collection Time: 11/25/18  3:19 PM  Result Value Ref Range Status   Specimen Description   Final    BLOOD RIGHT  ANTECUBITAL Performed at [facility] [address] [place], [place] [address]    Special Requests   Final    BOTTLES DRAWN AEROBIC AND ANAEROBIC Blood Culture adequate volume Performed at [facility] [address], [place], [place] [address]    Culture   Final    NO GROWTH 4 DAYS Performed at [facility], [place] [address], [place], [place] [address]    Report Status PENDING  Incomplete    Coagulation Studies: No results for input(s): LABPROT, INR in the last 72 hours.  Urinalysis: No results for input(s): COLORURINE, LABSPEC, PHURINE, GLUCOSEU, HGBUR, BILIRUBINUR, KETONESUR, PROTEINUR, UROBILINOGEN, NITRITE, LEUKOCYTESUR in the last 72 hours.  Invalid input(s): APPERANCEUR    Imaging: No results found.   Medications:    . acetaminophen  650 mg Oral QID  . apixaban  2.5 mg Oral BID  . collagenase   Topical Daily  . doxycycline  100 mg Oral Q12H  . lidocaine  2 patch Transdermal Q24H   albuterol, ALPRAZolam, furosemide, HYDROmorphone  Assessment/ Plan:   Acute kidney injury secondary to hypotension and shock.  This appears to be cardiogenic in origin.  I do not see any urine output recorded.  She is not using an ACE inhibitor or ARB nonsteroidal anti-inflammatory drugs.  There is no outpatient antibiotics.  Urinalysis and urine sodium pending.  No evidence of obstructive uropathy she has some abdominal pelvic ascites.  The IV bicarbonate has been discontinued.  Hyperkalemia.  Consider bowel ischemia with critically low blood pressures.  She has not a candidate for CRRT/dialysis as she is DNR.  Will need to consider hospice referral.  We will continue to manage hyperkalemia medically.  With no urine output this will be challenging.  I would favor comfort care.  She may be incontinent of urine.  I do not see any results from bladder scans  Anemia does not appear to be an issue at this time  COPD/emphysema continue nebulized  treatments  Lower extremity wounds treated with doxycycline 100 mg twice daily  Left thyroid lobe mass stable since 2008  Congestive heart failure with systolic dysfunction EF 51%, diuretics have been discontinued by primary service due to hypotension.  Think this is reasonable just to move towards comfort care.  I will sign off thank you ever so much for this very interesting consult  Shock possibly secondary to sepsis versus cardiogenic.  Patient is DNR appropriate discussion about end-of-life issues appreciate assistance from Dr. Hilma Favors.  LOS: [place] @TODAY @2 :54 PM

## 2018-11-29 NOTE — Progress Notes (Signed)
Palliative Care Follow-up  Mia Contreras slept all night after medication adjustments last PM. She is grateful for the pain control but feels weak and deconditioned. Her goals are to continue medical treatment and she would like a chance at rehab to regain her strength and to return home.She remains very frail, and will likely at some point in the near future have another significant decline.   Recommendations:  1. D/C klonopin, continue prn alprazolam with a scheduled PM dose of that and hydromorphone to help her sleep.  2. PT consult today.  3. She is still eating and reports being hungry.  4. Overall she is much better today even though slightly more sedated than I would like her to be if she is going to attempt rehab.  5. Will need palliative to follow at SNF.  Will follow closely.  Lane Hacker, DO Palliative Medicine  Time:20 min Greater than 50%  of this time was spent counseling and coordinating care related to the above assessment and plan.

## 2018-11-30 DIAGNOSIS — L8942 Pressure ulcer of contiguous site of back, buttock and hip, stage 2: Secondary | ICD-10-CM

## 2018-11-30 LAB — CULTURE, BLOOD (ROUTINE X 2)
Culture: NO GROWTH
Culture: NO GROWTH
Special Requests: ADEQUATE
Special Requests: ADEQUATE

## 2018-11-30 LAB — BASIC METABOLIC PANEL
Anion gap: 12 (ref 5–15)
BUN: 62 mg/dL — ABNORMAL HIGH (ref 8–23)
CO2: 22 mmol/L (ref 22–32)
Calcium: 7 mg/dL — ABNORMAL LOW (ref 8.9–10.3)
Chloride: 104 mmol/L (ref 98–111)
Creatinine, Ser: 1.34 mg/dL — ABNORMAL HIGH (ref 0.44–1.00)
GFR calc Af Amer: 41 mL/min — ABNORMAL LOW (ref >60)
GFR calc non Af Amer: 36 mL/min — ABNORMAL LOW (ref >60)
Glucose, Bld: 127 mg/dL — ABNORMAL HIGH (ref 70–99)
Potassium: 3.3 mmol/L — ABNORMAL LOW (ref 3.5–5.1)
Sodium: 138 mmol/L (ref 135–145)

## 2018-11-30 NOTE — Progress Notes (Signed)
Bedside shift report received, assumed care. Patient resting in bed with eyes closed. Will continue to monitor.

## 2018-11-30 NOTE — Progress Notes (Signed)
PROGRESS NOTE    Mia Contreras  YYT:[id number] DOB: 08/23/1928 DOA: 11/25/2018 PCP: Cassandria Anger, MD    Brief Narrative:  85 year old female who presented with leg edema and pain. She does have significant past medical history for hypertension, chronic systolic heart failure with ejection fraction 20 to 30%, chronic atrial fibrillation, CKD stage 3 B,COPD with hypoxic respiratory failure. Reported worsening lower extremity edematous pain for the last 48 hours, and treated for lower extremity cellulitis with IV antibiotic therapy vancomycin and oral Bactrim,doxycycline. On her initial physical examination her blood pressure was 87/65, heart rate 109, respiratory rate 22, oxygen saturation 92%. She had moist mucous membranes, lungs clear to auscultation bilaterally, heart S1-S2 present rhythm, abdomen was soft, positive lower extremity edema, positive bilateral lower extremity erythematous rash. Sodium 135, potassium 6.1, chloride 103, bicarb 26, glucose 123, BUN 66, creatinine 1.88, white count 16.6, hemoglobin 13.4, hematocrit 43.2, platelets 224  Patient was admitted to the hospital working diagnosis of sepsis due to lower extremity cellulitis complicated by acute kidney injury   Assessment & Plan:   Principal Problem:   Acute kidney injury superimposed on chronic kidney disease ([place]) Active Problems:   Anxiety   Essential hypertension   Chronic systolic heart failure (HCC)   Atrial fibrillation ([place])   Sepsis due to cellulitis (HCC)   Pressure injury of skin  1. Oliguric AKI on CKD stage 3 B and hyperkalemia.Renal function with a serum cr that continues to trend down, today at 1,34, K at 3,3 ans serum bicarbonate at 22. Documented urine output is 350 ml. Clinically with no signs of volume overload. Patient tolerating po well, will follow on renal panel in am. Hold on K correction for now, patient had hyperkalemia.   2. Bilateral lower extremity cellulitis. Wound  culture with multiple bacteria isolated, gram positive cocci and rods, moderate staph aureus (MRSA).Tolerating well doxycycline therapy, #5/8. Continue pain control with oral hydromorphone and local wound care. Patient will need SNF at discharge.   3. Chronic stable systolic heart failure. LV ejection fraction 20 to 25%.Blood pressure systolic has remained in the low 90's, with a MAP of 70 mmHg. Holding B blockers, after load reducing agents, ACE inh and ARB due to the risk of worsening hypotension.   4. Chronic atrial fibrillation.Anticoagulation with apixaban. HR in the 66 to 88 range.   5. Stage 2, sacrum pressure ulcer. Continue with local wound care.Continue to encourage out of bed to chair TID. Ambulation with physical therapy. Will need SNF.   6. Toxic encephalopathy (new). Clinically improved, will continue neuro checks per unit protocol, tolerating well alprazolam. Last dose 04/09.   DVT prophylaxis:apixaban Code Status:dnr Family Communication:No family at the bedside Disposition Plan/ discharge barriers:Patient medically stable to dc to SNF when bed available.  Body mass index is 20.19 kg/m. Malnutrition Type:      Malnutrition Characteristics:      Nutrition Interventions:     RN Pressure Injury Documentation: Pressure Injury 11/27/18 Stage II -  Partial thickness loss of dermis presenting as a shallow open ulcer with a red, pink wound bed without slough. 1X2 - pink, moist; Right between buttocks (Active)  11/27/18 0107  Location: Sacrum  Location Orientation: Medial  Staging: Stage II -  Partial thickness loss of dermis presenting as a shallow open ulcer with a red, pink wound bed without slough.  Wound Description (Comments): 1X2 - pink, moist; Right between buttocks  Present on Admission:      Consultants:   Nephrology  Palliative Care  Procedures:    Antimicrobials:       Subjective: Patient is feeling well this am, continue  to be very weak and deconditioned, not back to her baseline, no nausea or vomiting, no chest pain or dyspnea. Lower extremity pain improved with oral analgesics.   Objective: Vitals:   11/28/18 2125 11/29/18 0546 11/29/18 1250 11/29/18 2055  BP: (!) 89/59 90/62 (!) 89/63 90/60  Pulse: 66 89 81 88  Resp: 19 16 18 18   Temp: 97.7 F (36.5 C) (!) 97.3 F (36.3 C) 97.6 F (36.4 C) 98.4 F (36.9 C)  TempSrc: Oral Oral Oral Oral  SpO2: 90% 93% 90% 92%  Weight:      Height:        Intake/Output Summary (Last 24 hours) at 11/30/2018 0924 Last data filed at 11/30/2018 0500 Gross per 24 hour  Intake 400 ml  Output 250 ml  Net 150 ml   Filed Weights   11/25/18 1345  Weight: 51.7 kg    Examination:   General: deconditioned  Neurology: Awake and alert, non focal  E ENT: positive pallor, no icterus, oral mucosa moist Cardiovascular: No JVD. S1-S2 present, rhythmic, no gallops, rubs, or murmurs. No lower extremity edema. Pulmonary:  Positive breath sounds bilaterally, adequate air movement, no wheezing, rhonchi or rales. Gastrointestinal. Abdomen with no organomegaly, non tender, no rebound or guarding Skin. Bilateral dressings in place, to lower extremities.  Musculoskeletal: no joint deformities     Data Reviewed: I have personally reviewed following labs and imaging studies  CBC: Recent Labs  Lab 11/25/18 1533 11/26/18 0525 11/27/18 0535  WBC 16.6* 14.1* 12.1*  NEUTROABS 14.7*  --   --   HGB 13.4 11.9* 12.8  HCT 43.2 39.2 41.7  MCV 102.4* 105.1* 104.0*  PLT 224 209 962   Basic Metabolic Panel: Recent Labs  Lab 11/27/18 0535 11/27/18 1003 11/28/18 0514 11/29/18 0516 11/30/18 0432  NA 135 137 133* 137 138  K 7.1* 5.7* 4.3 3.6 3.3*  CL 106 107 102 104 104  CO2 20* 20* 20* 22 22  GLUCOSE 114* 69* 87 103* 127*  BUN 60* 59* 64* 64* 62*  CREATININE 1.90* 1.96* 1.79* 1.46* 1.34*  CALCIUM 7.2* 7.5* 6.8* 6.9* 7.0*  PHOS 5.1* 5.1* 4.1 3.6  --    GFR: Estimated  Creatinine Clearance: 24.1 mL/min (A) (by C-G formula based on SCr of 1.34 mg/dL (H)). Liver Function Tests: Recent Labs  Lab 11/25/18 1533 11/27/18 0535 11/27/18 1003 11/28/18 0514 11/29/18 0516  AST 22  --   --   --   --   ALT 27  --   --   --   --   ALKPHOS 115  --   --   --   --   BILITOT 1.1  --   --   --   --   PROT 6.1*  --   --   --   --   ALBUMIN 3.5 2.9* 3.0* 2.5* 2.6*   No results for input(s): LIPASE, AMYLASE in the last 168 hours. No results for input(s): AMMONIA in the last 168 hours. Coagulation Profile: No results for input(s): INR, PROTIME in the last 168 hours. Cardiac Enzymes: No results for input(s): CKTOTAL, CKMB, CKMBINDEX, TROPONINI in the last 168 hours. BNP (last 3 results) Recent Labs    07/23/18 1305 07/31/18 1118 10/22/18 1140  PROBNP 8,739* 7,046* 5,759*   HbA1C: No results for input(s): HGBA1C in the last 72 hours. CBG: Recent  Labs  Lab 11/27/18 0746 11/27/18 1008 11/27/18 1207 11/27/18 1229 11/27/18 1353  GLUCAP 96 74 63* 94 89   Lipid Profile: No results for input(s): CHOL, HDL, LDLCALC, TRIG, CHOLHDL, LDLDIRECT in the last 72 hours. Thyroid Function Tests: No results for input(s): TSH, T4TOTAL, FREET4, T3FREE, THYROIDAB in the last 72 hours. Anemia Panel: No results for input(s): VITAMINB12, FOLATE, FERRITIN, TIBC, IRON, RETICCTPCT in the last 72 hours.    Radiology Studies: I have reviewed all of the imaging during this hospital visit personally     Scheduled Meds: . acetaminophen  650 mg Oral QID  . apixaban  2.5 mg Oral BID  . collagenase   Topical Daily  . doxycycline  100 mg Oral Q12H  . lidocaine  2 patch Transdermal Q24H   Continuous Infusions:   LOS: 5 days        Mauricio Gerome Apley, MD

## 2018-12-01 LAB — BASIC METABOLIC PANEL
Anion gap: 8 (ref 5–15)
BUN: 49 mg/dL — ABNORMAL HIGH (ref 8–23)
CO2: 27 mmol/L (ref 22–32)
Calcium: 7.4 mg/dL — ABNORMAL LOW (ref 8.9–10.3)
Chloride: 107 mmol/L (ref 98–111)
Creatinine, Ser: 1.08 mg/dL — ABNORMAL HIGH (ref 0.44–1.00)
GFR calc Af Amer: 53 mL/min — ABNORMAL LOW (ref >60)
GFR calc non Af Amer: 46 mL/min — ABNORMAL LOW (ref >60)
Glucose, Bld: 80 mg/dL (ref 70–99)
Potassium: 3.6 mmol/L (ref 3.5–5.1)
Sodium: 142 mmol/L (ref 135–145)

## 2018-12-01 MED ORDER — POLYETHYLENE GLYCOL 3350 17 G PO PACK
17.0000 g | PACK | Freq: Two times a day (BID) | ORAL | Status: DC
  Administered 2018-12-01 – 2018-12-02 (×2): 17 g via ORAL
  Filled 2018-12-01 (×2): qty 1

## 2018-12-01 MED ORDER — PHENOL 1.4 % MT LIQD
1.0000 | OROMUCOSAL | Status: DC | PRN
  Filled 2018-12-01: qty 177

## 2018-12-01 NOTE — Progress Notes (Signed)
PROGRESS NOTE    Mia Contreras  OYD:[id number] DOB: 1928/08/24 DOA: 11/25/2018 PCP: Cassandria Anger, MD    Brief Narrative:  85 year old female who presented with leg edema and pain. She does have significant past medical history for hypertension, chronic systolic heart failure with ejection fraction 20 to 30%, chronic atrial fibrillation, CKD stage 3 B,COPD with hypoxic respiratory failure. Reported worsening lower extremity edematous pain for the last 48 hours, and treated for lower extremity cellulitis with IV antibiotic therapy vancomycin and oral Bactrim,doxycycline. On her initial physical examination her blood pressure was 87/65, heart rate 109, respiratory rate 22, oxygen saturation 92%. She had moist mucous membranes, lungs clear to auscultation bilaterally, heart S1-S2 present rhythm, abdomen was soft, positive lower extremity edema, positive bilateral lower extremity erythematous rash. Sodium 135, potassium 6.1, chloride 103, bicarb 26, glucose 123, BUN 66, creatinine 1.88, white count 16.6, hemoglobin 13.4, hematocrit 43.2, platelets 224  Patient was admitted to the hospital working diagnosis of sepsis due to lower extremity cellulitis complicated by acute kidney injury   Assessment & Plan:   Principal Problem:   Acute kidney injury superimposed on chronic kidney disease ([place]) Active Problems:   Anxiety   Essential hypertension   Chronic systolic heart failure (HCC)   Atrial fibrillation ([place])   Sepsis due to cellulitis (HCC)   Pressure injury of skin   1. AKI on CKD stage 3 B and hyperkalemia.Renal function with a serum cr that continues to trend down today at 1.08 with K at 3,6 and serum bicarbonate at 27. Documented urine output has improved up to 1,250 ml in 24 H. Continue close follow up of renal function and electrolytes.   2. Bilateral lower extremity cellulitis. Wound culture with multiple bacteria isolated, gram positive cocci and rods, moderate staph  aureus (MRSA).Will plan to continuedoxycycline for total of 8 days  #6/8. Wounds with no purulence, edema or frank erythema.   3. Chronic stable systolic heart failure. LV ejection fraction 20 to 25%. Systolic blood pressure today at 104 mmHg, will continue holding B blockers, after load reducing agents, ACE inh and ARB to prevent hypotension.   4. Chronic atrial fibrillation.Tolerating well anticoagulation withapixaban. Rate is controlled, not currently on AV blockade.    5. Stage 2, sacrum pressure ulcer. Local wound care.Out of bed to chair TID. Continue ambulation with physical therapy.Pending placement to SNF.   6. Toxic encephalopathy (new).Has resolved, continue as needed alprazolam and hydromorphone.  DVT prophylaxis:apixaban Code Status:dnr Family Communication:No family at the bedside Disposition Plan/ discharge barriers:Patient medically stable to dc to SNF when bed available.  Body mass index is 20.19 kg/m. Malnutrition Type:      Malnutrition Characteristics:      Nutrition Interventions:     RN Pressure Injury Documentation: Pressure Injury 11/27/18 Stage II -  Partial thickness loss of dermis presenting as a shallow open ulcer with a red, pink wound bed without slough. 1X2 - pink, moist; Right between buttocks (Active)  11/27/18 0107  Location: Sacrum  Location Orientation: Medial  Staging: Stage II -  Partial thickness loss of dermis presenting as a shallow open ulcer with a red, pink wound bed without slough.  Wound Description (Comments): 1X2 - pink, moist; Right between buttocks  Present on Admission:      Consultants:   Nephrology   Palliative care  Procedures:     Antimicrobials:   Doxycycline     Subjective: Patient continue to feel better, no nausea or vomiting, but positive constipation. Her  lower extremity pain is still present but seems to be controlled with analgesics, no significant anxiety. No nausea or  vomiting.   Objective: Vitals:   11/29/18 2055 11/30/18 1352 11/30/18 2100 12/01/18 0514  BP: 90/60 90/61 109/70 104/63  Pulse: 88 (!) 55 83 84  Resp: 18 16 16 16   Temp: 98.4 F (36.9 C) (!) 93.4 F (34.1 C)  (!) 96.9 F (36.1 C)  TempSrc: Oral Axillary  Axillary  SpO2: 92% 97% 97% 95%  Weight:      Height:        Intake/Output Summary (Last 24 hours) at 12/01/2018 0842 Last data filed at 12/01/2018 0519 Gross per 24 hour  Intake 240 ml  Output 1250 ml  Net -1010 ml   Filed Weights   11/25/18 1345  Weight: 51.7 kg    Examination:   General: deconditioned  Neurology: Awake and alert, non focal  E ENT: mild pallor, no icterus, oral mucosa moist Cardiovascular: No JVD. S1-S2 present, rhythmic, no gallops, rubs, or murmurs. No lower extremity edema. Pulmonary: vesicular breath sounds bilaterally, adequate air movement, no wheezing, rhonchi or rales. Gastrointestinal. Abdomen with no organomegaly, non tender, no rebound or guarding Skin. Bilateral lower legs wounds, lateral aspect. More deep on the right, positive granulation tissue, no purulence, erythema or edema. On the left small to medium ulcerated wounds.   Musculoskeletal: no joint deformities         Data Reviewed: I have personally reviewed following labs and imaging studies  CBC: Recent Labs  Lab 11/25/18 1533 11/26/18 0525 11/27/18 0535  WBC 16.6* 14.1* 12.1*  NEUTROABS 14.7*  --   --   HGB 13.4 11.9* 12.8  HCT 43.2 39.2 41.7  MCV 102.4* 105.1* 104.0*  PLT 224 209 323   Basic Metabolic Panel: Recent Labs  Lab 11/27/18 0535 11/27/18 1003 11/28/18 0514 11/29/18 0516 11/30/18 0432 12/01/18 0531  NA 135 137 133* 137 138 142  K 7.1* 5.7* 4.3 3.6 3.3* 3.6  CL 106 107 102 104 104 107  CO2 20* 20* 20* 22 22 27   GLUCOSE 114* 69* 87 103* 127* 80  BUN 60* 59* 64* 64* 62* 49*  CREATININE 1.90* 1.96* 1.79* 1.46* 1.34* 1.08*  CALCIUM 7.2* 7.5* 6.8* 6.9* 7.0* 7.4*  PHOS 5.1* 5.1* 4.1 3.6  --   --      GFR: Estimated Creatinine Clearance: 30 mL/min (A) (by C-G formula based on SCr of 1.08 mg/dL (H)). Liver Function Tests: Recent Labs  Lab 11/25/18 1533 11/27/18 0535 11/27/18 1003 11/28/18 0514 11/29/18 0516  AST 22  --   --   --   --   ALT 27  --   --   --   --   ALKPHOS 115  --   --   --   --   BILITOT 1.1  --   --   --   --   PROT 6.1*  --   --   --   --   ALBUMIN 3.5 2.9* 3.0* 2.5* 2.6*   No results for input(s): LIPASE, AMYLASE in the last 168 hours. No results for input(s): AMMONIA in the last 168 hours. Coagulation Profile: No results for input(s): INR, PROTIME in the last 168 hours. Cardiac Enzymes: No results for input(s): CKTOTAL, CKMB, CKMBINDEX, TROPONINI in the last 168 hours. BNP (last 3 results) Recent Labs    07/23/18 1305 07/31/18 1118 10/22/18 1140  PROBNP 8,739* 7,046* 5,759*   HbA1C: No results for input(s): HGBA1C in  the last 72 hours. CBG: Recent Labs  Lab 11/27/18 0746 11/27/18 1008 11/27/18 1207 11/27/18 1229 11/27/18 1353  GLUCAP 96 74 63* 94 89   Lipid Profile: No results for input(s): CHOL, HDL, LDLCALC, TRIG, CHOLHDL, LDLDIRECT in the last 72 hours. Thyroid Function Tests: No results for input(s): TSH, T4TOTAL, FREET4, T3FREE, THYROIDAB in the last 72 hours. Anemia Panel: No results for input(s): VITAMINB12, FOLATE, FERRITIN, TIBC, IRON, RETICCTPCT in the last 72 hours.    Radiology Studies: I have reviewed all of the imaging during this hospital visit personally     Scheduled Meds:  acetaminophen  650 mg Oral QID   apixaban  2.5 mg Oral BID   collagenase   Topical Daily   doxycycline  100 mg Oral Q12H   lidocaine  2 patch Transdermal Q24H   Continuous Infusions:   LOS: 6 days        Mieczyslaw Stamas Gerome Apley, MD

## 2018-12-02 DIAGNOSIS — R278 Other lack of coordination: Secondary | ICD-10-CM | POA: Diagnosis not present

## 2018-12-02 DIAGNOSIS — I482 Chronic atrial fibrillation, unspecified: Secondary | ICD-10-CM | POA: Diagnosis not present

## 2018-12-02 DIAGNOSIS — I10 Essential (primary) hypertension: Secondary | ICD-10-CM | POA: Diagnosis not present

## 2018-12-02 DIAGNOSIS — R05 Cough: Secondary | ICD-10-CM | POA: Diagnosis not present

## 2018-12-02 DIAGNOSIS — I4811 Longstanding persistent atrial fibrillation: Secondary | ICD-10-CM | POA: Diagnosis not present

## 2018-12-02 DIAGNOSIS — Z7401 Bed confinement status: Secondary | ICD-10-CM | POA: Diagnosis not present

## 2018-12-02 DIAGNOSIS — A419 Sepsis, unspecified organism: Secondary | ICD-10-CM | POA: Diagnosis not present

## 2018-12-02 DIAGNOSIS — B0230 Zoster ocular disease, unspecified: Secondary | ICD-10-CM | POA: Diagnosis not present

## 2018-12-02 DIAGNOSIS — N139 Obstructive and reflux uropathy, unspecified: Secondary | ICD-10-CM | POA: Diagnosis not present

## 2018-12-02 DIAGNOSIS — N189 Chronic kidney disease, unspecified: Secondary | ICD-10-CM | POA: Diagnosis not present

## 2018-12-02 DIAGNOSIS — J811 Chronic pulmonary edema: Secondary | ICD-10-CM | POA: Diagnosis not present

## 2018-12-02 DIAGNOSIS — L89159 Pressure ulcer of sacral region, unspecified stage: Secondary | ICD-10-CM | POA: Diagnosis not present

## 2018-12-02 DIAGNOSIS — N183 Chronic kidney disease, stage 3 (moderate): Secondary | ICD-10-CM | POA: Diagnosis not present

## 2018-12-02 DIAGNOSIS — I4891 Unspecified atrial fibrillation: Secondary | ICD-10-CM | POA: Diagnosis not present

## 2018-12-02 DIAGNOSIS — E079 Disorder of thyroid, unspecified: Secondary | ICD-10-CM | POA: Diagnosis not present

## 2018-12-02 DIAGNOSIS — R5381 Other malaise: Secondary | ICD-10-CM | POA: Diagnosis not present

## 2018-12-02 DIAGNOSIS — G3184 Mild cognitive impairment, so stated: Secondary | ICD-10-CM | POA: Diagnosis not present

## 2018-12-02 DIAGNOSIS — R2689 Other abnormalities of gait and mobility: Secondary | ICD-10-CM | POA: Diagnosis not present

## 2018-12-02 DIAGNOSIS — F331 Major depressive disorder, recurrent, moderate: Secondary | ICD-10-CM | POA: Diagnosis not present

## 2018-12-02 DIAGNOSIS — R627 Adult failure to thrive: Secondary | ICD-10-CM | POA: Diagnosis not present

## 2018-12-02 DIAGNOSIS — H1089 Other conjunctivitis: Secondary | ICD-10-CM | POA: Diagnosis not present

## 2018-12-02 DIAGNOSIS — R338 Other retention of urine: Secondary | ICD-10-CM | POA: Diagnosis not present

## 2018-12-02 DIAGNOSIS — L988 Other specified disorders of the skin and subcutaneous tissue: Secondary | ICD-10-CM | POA: Diagnosis not present

## 2018-12-02 DIAGNOSIS — M2550 Pain in unspecified joint: Secondary | ICD-10-CM | POA: Diagnosis not present

## 2018-12-02 DIAGNOSIS — N179 Acute kidney failure, unspecified: Secondary | ICD-10-CM | POA: Diagnosis not present

## 2018-12-02 DIAGNOSIS — R531 Weakness: Secondary | ICD-10-CM | POA: Diagnosis not present

## 2018-12-02 DIAGNOSIS — L0390 Cellulitis, unspecified: Secondary | ICD-10-CM | POA: Diagnosis not present

## 2018-12-02 DIAGNOSIS — E0520 Thyrotoxicosis with toxic multinodular goiter without thyrotoxic crisis or storm: Secondary | ICD-10-CM | POA: Diagnosis not present

## 2018-12-02 DIAGNOSIS — J449 Chronic obstructive pulmonary disease, unspecified: Secondary | ICD-10-CM | POA: Diagnosis not present

## 2018-12-02 DIAGNOSIS — E876 Hypokalemia: Secondary | ICD-10-CM | POA: Diagnosis not present

## 2018-12-02 DIAGNOSIS — I5022 Chronic systolic (congestive) heart failure: Secondary | ICD-10-CM | POA: Diagnosis not present

## 2018-12-02 DIAGNOSIS — I83028 Varicose veins of left lower extremity with ulcer other part of lower leg: Secondary | ICD-10-CM | POA: Diagnosis not present

## 2018-12-02 DIAGNOSIS — F419 Anxiety disorder, unspecified: Secondary | ICD-10-CM | POA: Diagnosis not present

## 2018-12-02 DIAGNOSIS — I83018 Varicose veins of right lower extremity with ulcer other part of lower leg: Secondary | ICD-10-CM | POA: Diagnosis not present

## 2018-12-02 DIAGNOSIS — L03116 Cellulitis of left lower limb: Secondary | ICD-10-CM | POA: Diagnosis not present

## 2018-12-02 DIAGNOSIS — N184 Chronic kidney disease, stage 4 (severe): Secondary | ICD-10-CM | POA: Diagnosis not present

## 2018-12-02 DIAGNOSIS — I5042 Chronic combined systolic (congestive) and diastolic (congestive) heart failure: Secondary | ICD-10-CM | POA: Diagnosis not present

## 2018-12-02 DIAGNOSIS — L03115 Cellulitis of right lower limb: Secondary | ICD-10-CM | POA: Diagnosis not present

## 2018-12-02 DIAGNOSIS — I509 Heart failure, unspecified: Secondary | ICD-10-CM | POA: Diagnosis not present

## 2018-12-02 DIAGNOSIS — L8942 Pressure ulcer of contiguous site of back, buttock and hip, stage 2: Secondary | ICD-10-CM | POA: Diagnosis not present

## 2018-12-02 DIAGNOSIS — J9611 Chronic respiratory failure with hypoxia: Secondary | ICD-10-CM | POA: Diagnosis not present

## 2018-12-02 LAB — BASIC METABOLIC PANEL
Anion gap: 8 (ref 5–15)
BUN: 41 mg/dL — ABNORMAL HIGH (ref 8–23)
CO2: 28 mmol/L (ref 22–32)
Calcium: 7.6 mg/dL — ABNORMAL LOW (ref 8.9–10.3)
Chloride: 107 mmol/L (ref 98–111)
Creatinine, Ser: 0.89 mg/dL (ref 0.44–1.00)
GFR calc Af Amer: >60 mL/min (ref >60)
GFR calc non Af Amer: 58 mL/min — ABNORMAL LOW (ref >60)
Glucose, Bld: 96 mg/dL (ref 70–99)
Potassium: 4.1 mmol/L (ref 3.5–5.1)
Sodium: 143 mmol/L (ref 135–145)

## 2018-12-02 MED ORDER — MINERAL OIL RE ENEM
1.0000 | ENEMA | Freq: Once | RECTAL | Status: AC
  Administered 2018-12-02: 14:00:00 1 via RECTAL
  Filled 2018-12-02: qty 1

## 2018-12-02 MED ORDER — POLYETHYLENE GLYCOL 3350 17 G PO PACK: 17.0000 g | PACK | Freq: Two times a day (BID) | ORAL | 0 refills | Status: AC

## 2018-12-02 MED ORDER — COLLAGENASE 250 UNIT/GM EX OINT: TOPICAL_OINTMENT | Freq: Every day | CUTANEOUS | 0 refills | Status: AC

## 2018-12-02 MED ORDER — TORSEMIDE 20 MG PO TABS: 60.0000 mg | ORAL_TABLET | Freq: Every day | ORAL | 0 refills | Status: AC | PRN

## 2018-12-02 MED ORDER — ALPRAZOLAM 0.5 MG PO TABS: 0.5000 mg | ORAL_TABLET | Freq: Two times a day (BID) | ORAL | 0 refills | Status: AC | PRN

## 2018-12-02 NOTE — NC FL2 (Signed)
[place] MEDICAID FL2 LEVEL OF CARE SCREENING TOOL     IDENTIFICATION  Patient Name: Mia Contreras Birthdate: 1929/03/23 Sex: female Admission Date (Current Location): 11/25/2018  [facility] and [place] Number:  Herbalist and Address:  [facility],  [address], [place]      Provider Number: [id number]  Attending Physician Name and Address:  Tawni Millers  Relative Name and Phone Number:       Current Level of Care: Hospital Recommended Level of Care: [place] Prior Approval Number:    Date Approved/Denied:   PASRR Number: [id number]  Discharge Plan: SNF    Current Diagnoses: Patient Active Problem List   Diagnosis Date Noted  . Pressure injury of skin 11/27/2018  . Sepsis due to cellulitis ([place]) 11/25/2018  . DNR (do not resuscitate) 10/23/2018  . Cellulitis due to methicillin-resistant Staphylococcus aureus (MRSA) 10/09/2018  . Failure to thrive in adult 09/23/2018  . Dehydration 09/16/2018  . AKI (acute kidney injury) ([place]) 09/16/2018  . Genetic testing 07/22/2018  . Personal history of breast cancer 07/01/2018  . Family history of prostate cancer   . Family history of skin cancer   . Family history of lymphoma   . Family history of brain tumor   . Family history of lung cancer   . Severe tricuspid regurgitation 06/19/2018  . Malignant neoplasm of upper-inner quadrant of left breast in female, estrogen receptor positive ([place]) 05/10/2018  . Grief 02/12/2018  . Psoriasis 09/14/2017  . Liver disorder 09/13/2017  . Hematoma of leg 08/24/2017  . Myalgia 06/21/2017  . Rash and nonspecific skin eruption 05/22/2017  . Arthralgia 04/13/2017  . Vaginitis, atrophic 03/06/2017  . Symptomatic bradycardia   . Acute systolic CHF (congestive heart failure) ([place])   . Hyperkalemia 06/06/2016  . Skin tear of left forearm without complication 00/86/7619  . HCAP (healthcare-associated pneumonia) 01/30/2016   . Chronic anticoagulation-Eliquis 01/30/2016  . Hypotension-currently asymptomatic 01/30/2016  . Malnutrition of moderate degree 01/27/2016  . Falls 01/26/2016  . Hypoxia 01/26/2016  . Chronic CHF (congestive heart failure) ([place]) 01/26/2016  . Atrial fibrillation ([place]) 01/11/2016  . Acute kidney injury superimposed on chronic kidney disease ([place]) 12/14/2015  . CRF (chronic renal failure) 12/14/2015  . IBS (irritable bowel syndrome) 12/14/2015  . Wound, open, leg 11/08/2015  . Hiatal hernia 05/12/2015  . Subconjunctival hemorrhage 08/07/2014  . Nausea without vomiting 07/15/2014  . Noninfected skin tear of right leg 07/03/2014  . Hypoglycemia 12/12/2013  . Chronic systolic heart failure ([place]) 08/22/2013  . NICM (nonischemic cardiomyopathy) ([place]) 08/22/2013  . Diarrhea 10/01/2012  . Dyspnea on exertion 06/27/2012  . Asthmatic bronchitis 06/27/2012  . UTI (urinary tract infection) 11/02/2011  . Postoperative anemia due to acute blood loss 11/01/2011  . COPD (chronic obstructive pulmonary disease) ([place]) 08/16/2011  . Cough 08/08/2011  . Preop exam for internal medicine 08/02/2011  . Cerumen impaction 08/02/2011  . Bruit 04/19/2011  . Arrhythmia 03/29/2011  . Neoplasm of uncertain behavior of skin 09/21/2010  . PREMATURE ATRIAL CONTRACTIONS 09/21/2010  . CYSTITIS 09/21/2010  . Thyrotoxicosis 05/23/2010  . GOITER, MULTINODULAR 05/06/2010  . TOBACCO USE, QUIT 06/14/2009  . ELECTROCARDIOGRAM, ABNORMAL 04/02/2009  . SKIN CANCER, HX OF 11/12/2008  . CARPAL TUNNEL SYNDROME, RIGHT 01/11/2008  . SCOLIOSIS 01/11/2008  . VENOUS INSUFFICIENCY 09/05/2007  . Edema 09/05/2007  . Anemia in chronic kidney disease 08/20/2007  . DIVERTICULOSIS, COLON 08/20/2007  . CELLULITIS, LEG, RIGHT 08/20/2007  . Osteoporosis 08/20/2007  .  Open wound of knee, leg, and ankle 07/03/2007  . Anxiety 03/16/2007  . Essential hypertension 03/16/2007  . Osteoarthritis 03/16/2007  . hx: breast cancer, left,  infiltrating ductal 03/16/2007    Orientation RESPIRATION BLADDER Height & Weight     Self, Time, Situation, Place  O2(4L ) Incontinent Weight: 114 lb (51.7 kg) Height:  5\' 3"  (160 cm)  BEHAVIORAL SYMPTOMS/MOOD NEUROLOGICAL BOWEL NUTRITION STATUS      Continent Diet(renal diet- 1200 mL fluid restriction)  AMBULATORY STATUS COMMUNICATION OF NEEDS Skin   Extensive Assist Verbally PU Stage and Appropriate Care(stage II pressure injury sacrum, wet to dry foam dressing changes daily)                       Personal Care Assistance Level of Assistance  Bathing, Feeding, Dressing Bathing Assistance: Maximum assistance Feeding assistance: Independent Dressing Assistance: Limited assistance     Functional Limitations Info  Sight, Hearing, Speech Sight Info: Adequate Hearing Info: Adequate Speech Info: Adequate    SPECIAL CARE FACTORS FREQUENCY  PT (By licensed PT), OT (By licensed OT)     PT Frequency: 5x OT Frequency: 5x            Contractures Contractures Info: Not present    Additional Factors Info  Code Status, Allergies, Isolation Precautions Code Status Info: DNR Allergies Info: Cefuroxime, Celecoxib, Deltasone Prednisone, Ranitidine, Spironolactone, Tapazole Methimazole, Tramadol Hcl, Doxycycline, Entresto Sacubitril-valsartan     Isolation Precautions Info: contact precautions MRSA     Current Medications (12/02/2018):  This is the current hospital active medication list Current Facility-Administered Medications  Medication Dose Route Frequency Provider Last Rate Last Dose  . acetaminophen (TYLENOL) tablet 650 mg  650 mg Oral QID Lane Hacker L, DO   650 mg at 12/01/18 2257  . albuterol (PROVENTIL) (2.5 MG/3ML) 0.083% nebulizer solution 2.5 mg  2.5 mg Nebulization Q4H PRN Patrecia Pour, MD      . ALPRAZolam Duanne Moron) tablet 1 mg  1 mg Oral Q4H PRN Lane Hacker L, DO   1 mg at 11/28/18 1004  . apixaban (ELIQUIS) tablet 2.5 mg  2.5 mg Oral BID Patrecia Pour, MD   2.5 mg at 12/01/18 2257  . collagenase (SANTYL) ointment   Topical Daily Vance Gather B, MD      . doxycycline (VIBRA-TABS) tablet 100 mg  100 mg Oral Q12H Arrien, Jimmy Picket, MD   100 mg at 12/01/18 2257  . HYDROmorphone (DILAUDID) tablet 1 mg  1 mg Oral Q2H PRN Arrien, Jimmy Picket, MD   1 mg at 12/01/18 1057  . lidocaine (LIDODERM) 5 % 2 patch  2 patch Transdermal Q24H Lane Hacker L, DO   2 patch at 12/01/18 1057  . phenol (CHLORASEPTIC) mouth spray 1 spray  1 spray Mouth/Throat PRN Arrien, Jimmy Picket, MD      . polyethylene glycol [facility] Floria Raveling) packet 17 g  17 g Oral BID Tawni Millers, MD   17 g at 12/01/18 2258     Discharge Medications: Please see discharge summary for a list of discharge medications.  Relevant Imaging Results:  Relevant Lab Results:   Additional Information SS#: [id number]  Nila Nephew, LCSW

## 2018-12-02 NOTE — Consult Note (Signed)
   [facility] CM Inpatient Consult   12/02/2018  Lauriann Milillo Reifschneider 10/20/1928 [id number]   Patient screened for potential [facility] Care Management services due to unplanned readmission risk score of 31%, extreme, and multiple hospitalizations.   Per chart review, current disposition plan is for SNF. No [facility] CM needs at this time.   Netta Cedars, MSN, [facility] Liaison Nurse Mobile Phone [phone]  Toll free office [phone]

## 2018-12-02 NOTE — Progress Notes (Signed)
Patient transferred to [facility] via [facility].

## 2018-12-02 NOTE — Care Management Important Message (Signed)
Important Message  Patient Details IM Letter given to the Case Manager to present to the Patient  Name: Mia Contreras MRN: [id number] Date of Birth: Apr 07, 1929   Medicare Important Message Given:  Yes    Kerin Salen 12/02/2018, 1:13 PM

## 2018-12-02 NOTE — TOC Initial Note (Signed)
Transition of Care [facility]) - Initial/Assessment Note    Patient Details  Name: Mia Contreras MRN: [id number] Date of Birth: June 13, 1929  Transition of Care [facility]) CM/SW Contact:    Nila Nephew, LCSW Phone Number: [phone] 12/02/2018, 1:42 PM  Clinical Narrative:  Pt admitting from [facility] SNF room #3218, report [phone]  Pt admitted from home where she resides alone, treated for sepsis secondary to cellulitis. Has CHF, CKD, and COPD as well, is followed at heart failure clinic. Uses home o2 at baseline.          Pt recommended to go to SNF for therapy and wound care. She is in agreement and requests [name] due to having been there in 2017 and had good experience. CSW completed FL2 and referral and pt transporting to [name] this afternoon. She informed her family/friends of plan.    Expected Discharge Plan: Skilled Nursing Facility Barriers to Discharge: No Barriers Identified   Patient Goals and CMS Choice Patient states their goals for this hospitalization and ongoing recovery are:: "get some therapy again" CMS Medicare.gov Compare Post Acute Care list provided to:: Patient Choice offered to / list presented to : NA(pt requested facility)  Expected Discharge Plan and Services Expected Discharge Plan: [place] Choice: [place] Living arrangements for the past 2 months: [place] Expected Discharge Date: 12/02/18                        Prior Living Arrangements/Services Living arrangements for the past 2 months: [place] Lives with:: Self Patient language and need for interpreter reviewed:: Yes Do you feel safe going back to the place where you live?: Yes      Need for Family Participation in Patient Care: No (Comment)(no close family. ) Care giver support system in place?: No (comment)(pt lives alone- friends and siblings are support system) Current home  services: DME Criminal Activity/Legal Involvement Pertinent to Current Situation/Hospitalization: No - Comment as needed  Activities of Daily Living Home Assistive Devices/Equipment: None ADL Screening (condition at time of admission) Patient's cognitive ability adequate to safely complete daily activities?: Yes Is the patient deaf or have difficulty hearing?: No Does the patient have difficulty seeing, even when wearing glasses/contacts?: No Does the patient have difficulty concentrating, remembering, or making decisions?: No Patient able to express need for assistance with ADLs?: Yes Does the patient have difficulty dressing or bathing?: No Independently performs ADLs?: No Communication: Independent Dressing (OT): Independent Grooming: Independent Feeding: Independent Bathing: Independent Toileting: Needs assistance Is this a change from baseline?: Pre-admission baseline In/Out Bed: Needs assistance Is this a change from baseline?: Pre-admission baseline Walks in Home: Independent Does the patient have difficulty walking or climbing stairs?: Yes Weakness of Legs: Both Weakness of Arms/Hands: None  Permission Sought/Granted                  Emotional Assessment Appearance:: Appears stated age Attitude/Demeanor/Rapport: Engaged Affect (typically observed): Accepting, Adaptable Orientation: : Oriented to Self, Oriented to Place, Oriented to  Time, Oriented to Situation Alcohol / Substance Use: Not Applicable Psych Involvement: No (comment)  Admission diagnosis:  Cellulitis, unspecified cellulitis site [L03.90] Patient Active Problem List   Diagnosis Date Noted  . Pressure injury of skin 11/27/2018  . Sepsis due to cellulitis ([place]) 11/25/2018  . DNR (do not resuscitate) 10/23/2018  . Cellulitis due to methicillin-resistant Staphylococcus aureus (MRSA) 10/09/2018  . Failure to thrive in  adult 09/23/2018  . Dehydration 09/16/2018  . AKI (acute kidney injury) ([place])  09/16/2018  . Genetic testing 07/22/2018  . Personal history of breast cancer 07/01/2018  . Family history of prostate cancer   . Family history of skin cancer   . Family history of lymphoma   . Family history of brain tumor   . Family history of lung cancer   . Severe tricuspid regurgitation 06/19/2018  . Malignant neoplasm of upper-inner quadrant of left breast in female, estrogen receptor positive ([place]) 05/10/2018  . Grief 02/12/2018  . Psoriasis 09/14/2017  . Liver disorder 09/13/2017  . Hematoma of leg 08/24/2017  . Myalgia 06/21/2017  . Rash and nonspecific skin eruption 05/22/2017  . Arthralgia 04/13/2017  . Vaginitis, atrophic 03/06/2017  . Symptomatic bradycardia   . Acute systolic CHF (congestive heart failure) ([place])   . Hyperkalemia 06/06/2016  . Skin tear of left forearm without complication 73/53/2992  . HCAP (healthcare-associated pneumonia) 01/30/2016  . Chronic anticoagulation-Eliquis 01/30/2016  . Hypotension-currently asymptomatic 01/30/2016  . Malnutrition of moderate degree 01/27/2016  . Falls 01/26/2016  . Hypoxia 01/26/2016  . Chronic CHF (congestive heart failure) ([place]) 01/26/2016  . Atrial fibrillation ([place]) 01/11/2016  . Acute kidney injury superimposed on chronic kidney disease ([place]) 12/14/2015  . CRF (chronic renal failure) 12/14/2015  . IBS (irritable bowel syndrome) 12/14/2015  . Wound, open, leg 11/08/2015  . Hiatal hernia 05/12/2015  . Subconjunctival hemorrhage 08/07/2014  . Nausea without vomiting 07/15/2014  . Noninfected skin tear of right leg 07/03/2014  . Hypoglycemia 12/12/2013  . Chronic systolic heart failure ([place]) 08/22/2013  . NICM (nonischemic cardiomyopathy) ([place]) 08/22/2013  . Diarrhea 10/01/2012  . Dyspnea on exertion 06/27/2012  . Asthmatic bronchitis 06/27/2012  . UTI (urinary tract infection) 11/02/2011  . Postoperative anemia due to acute blood loss 11/01/2011  . COPD (chronic obstructive pulmonary disease) ([place])  08/16/2011  . Cough 08/08/2011  . Preop exam for internal medicine 08/02/2011  . Cerumen impaction 08/02/2011  . Bruit 04/19/2011  . Arrhythmia 03/29/2011  . Neoplasm of uncertain behavior of skin 09/21/2010  . PREMATURE ATRIAL CONTRACTIONS 09/21/2010  . CYSTITIS 09/21/2010  . Thyrotoxicosis 05/23/2010  . GOITER, MULTINODULAR 05/06/2010  . TOBACCO USE, QUIT 06/14/2009  . ELECTROCARDIOGRAM, ABNORMAL 04/02/2009  . SKIN CANCER, HX OF 11/12/2008  . CARPAL TUNNEL SYNDROME, RIGHT 01/11/2008  . SCOLIOSIS 01/11/2008  . VENOUS INSUFFICIENCY 09/05/2007  . Edema 09/05/2007  . Anemia in chronic kidney disease 08/20/2007  . DIVERTICULOSIS, COLON 08/20/2007  . CELLULITIS, LEG, RIGHT 08/20/2007  . Osteoporosis 08/20/2007  . Open wound of knee, leg, and ankle 07/03/2007  . Anxiety 03/16/2007  . Essential hypertension 03/16/2007  . Osteoarthritis 03/16/2007  . hx: breast cancer, left, infiltrating ductal 03/16/2007   PCP:  Cassandria Anger, MD Pharmacy:   [facility] [address], [place] - [address] [place] [address] Phone: [phone] Fax: [phone]     Social Determinants of Health (SDOH) Interventions    Readmission Risk Interventions Readmission Risk Prevention Plan 12/02/2018  Transportation Screening Complete  Medication Review ([place]) Complete  PCP or Specialist appointment within 3-5 days of discharge Complete  HRI or [place] (No Data)  SW Recovery Care/Counseling Consult Complete  Palliative Care Screening Complete  Skilled Nursing Facility Complete  Some recent data might be hidden

## 2018-12-02 NOTE — Discharge Summary (Addendum)
Physician Discharge Summary  Mia Contreras VVO:[id number] DOB: [birth year] DOA: 11/25/2018  PCP: Cassandria Anger, MD  Admit date: 11/25/2018 Discharge date: 12/02/2018  Admitted From: Home  Disposition:  SNF   Recommendations for Outpatient Follow-up and new medication changes:  1. Follow up with Dr. Alain Marion in 7 days.  2. Palliative care follow up at the SNF 3. Continue local wound care.  4. Resumed low dose carvedilol. 5. Use torsemide as needed for hypervolemia (lower extremity edema, dyspnea, weight gain 3 lbs in 48 H or 5 lbs in 7 days) 6. Added miralax for bowel regimen.  7. Holding K supplements for now.  8. Follow up renal panel in 7 days.   Home Health: na   Equipment/Devices: na    Discharge Condition: stable  CODE STATUS: DNR   Diet recommendation: heart healthy   Brief/Interim Summary: 85 year old female who presented with leg edema and pain. She does have significant past medical history for hypertension, chronic systolic heart failure with ejection fraction 20 to 30%, chronic atrial fibrillation, CKD stage 3 B,COPD with chronic hypoxic respiratory failure. Reported worsening lower extremity painful edema for the last 48 hours. Recently hospitalized 02/20 and treated for lower extremity cellulitis with IV antibiotic therapy vancomycin, oral Bactrim, anddoxycycline. On her initial physical examination her blood pressure was 87/65, heart rate 109, respiratory rate 22, oxygen saturation 92%. She had moist mucous membranes, lungs clear to auscultation bilaterally, heart S1-S2 present irregular, abdomen was soft, positive lower extremity edema, positive bilateral lower extremity erythematous rash with bilateral wounds. Sodium 135, potassium 6.1, chloride 103, bicarb 26, glucose 123, BUN 66, creatinine 1.88, white count 16.6, hemoglobin 13.4, hematocrit 43.2, platelets 224.  Chest radiograph with clear clear for infiltrates, left rotation small left effusion, positive  cardiomegaly. EKG had 94 bpm, atrial fibrillation rhythm, left axis deviation with left anterior fascicular block and interventricular conduction delay, Q waves V2 through V4.  No ST segment or T wave changes.  Patient was admitted to the hospital with a working diagnosis of sepsis due to lower extremity cellulitis complicated by acute kidney injury.   1.  Sepsis, present on admission due to bilateral lower extremity cellulitis.  Patient was admitted to the medical ward, she was placed on remote telemetry monitor, received antibiotic therapy with IV vancomycin, with improvement of her rash.  She was transitioned to oral doxycycline with good toleration, and completed her antibiotic therapy during her hospitalization.  She will continue wound care as an outpatient.  2.  Acute kidney injury due to ATN on chronic kidney disease stage IIIb with hyperkalemia.  Patient had severe worsening of her kidney function, her peak creatinine reached 1.96, potassium 7.1.  She was medically treated with intravenous bicarbonate, sodium zirconium, nephrotoxic agents and hypotension was avoided.  She responded well to the medical therapy, her discharge creatinine is 0.86, potassium 4.1 and serum bicarbonate of 28.  Follow closely kidney function and electrolytes as outpatient.  Continue diuretic therapy with torsemide only as needed.  Patient was seen by nephrology, due to comorbidities she was found not candidate for renal placement therapy.  Patient will be followed by palliative care at the skilled nursing facility.  3.  Chronic stable systolic heart failure, ejection fraction 20 to 25%.  Patient had episodic hypotension, afterload reducing agents, ACE inhibitor or ARB were avoided to prevent further episodes of hypotension.  Continue torsemide only as needed for signs of hypervolemia.  Low dose carvedilol will be resumed.  4.  Chronic atrial fibrillation.  Rate has been well controlled, at discharge will resume low-dose  carvedilol. Continue apixaban for anticoagulation.  5.  Stage II sacral pressure ulcers.  Patient will continue local wound care physical therapy at SNF.  6.  Toxic encephalopathy.  She received benzodiazepines for anxiety, dose was titrated to avoid somnolence.  Currently on alprazolam as needed.  7.  Left thyroid lobe mass.  Chronic follow-up as an outpatient.  8.  COPD with chronic hypoxic respiratory failure.  Stable with no signs of exacerbation.  Discharge Diagnoses:  Principal Problem:   Acute kidney injury superimposed on chronic kidney disease ([place]) Active Problems:   Anxiety   Essential hypertension   Chronic systolic heart failure (HCC)   Atrial fibrillation (HCC)   Sepsis due to cellulitis (HCC)   Pressure injury of skin    Discharge Instructions   Allergies as of 12/02/2018      Reactions   Cefuroxime Diarrhea   Celecoxib Nausea Only   Deltasone [prednisone] Swelling   Oral deltasone is causing swelling (can use Depo-Medrol)   Ranitidine Other (See Comments)   bloating   Spironolactone Other (See Comments)   High K   Tapazole [methimazole] Rash   Tramadol Hcl Nausea Only   Doxycycline Diarrhea, Nausea Only   Entresto [sacubitril-valsartan] Other (See Comments)   Unknown reaction per pt      Medication List    STOP taking these medications   coal tar 0.5 % shampoo Commonly known as:  NEUTROGENA T-GEL   doxycycline 100 MG tablet Commonly known as:  VIBRA-TABS   ketoconazole 2 % shampoo Commonly known as:  NIZORAL   loratadine 10 MG tablet Commonly known as:  CLARITIN   potassium chloride SA 20 MEQ tablet Commonly known as:  K-DUR,KLOR-CON   SYSTANE ULTRA PF OP     TAKE these medications   acetaminophen 500 MG tablet Commonly known as:  TYLENOL Take 500 mg by mouth at bedtime as needed.   ALPRAZolam 0.5 MG tablet Commonly known as:  XANAX Take 1 tablet (0.5 mg total) by mouth 2 (two) times daily as needed for anxiety or sleep. What  changed:  how much to take   carvedilol 3.125 MG tablet Commonly known as:  COREG Take 2 tablets (6.25 mg total) by mouth 2 (two) times daily with a meal. What changed:  how much to take   collagenase ointment Commonly known as:  SANTYL Apply topically daily. Apply to full thickness wound on the left lower extremity with necrotic tissue once daily, 1/8 inch layer.   Eliquis 2.5 MG Tabs tablet Generic drug:  apixaban TAKE ONE TABLET BY MOUTH TWICE A DAY  **MUST CALL MD FOR APPOINTMENT FOR ADDITIONAL REFILLS What changed:  See the new instructions.   OXYGEN Inhale 1.5 L into the lungs at bedtime.   polyethylene glycol 17 g packet Commonly known as:  MIRALAX / GLYCOLAX Take 17 g by mouth 2 (two) times daily.   PROBIOTIC PO Take 1 tablet by mouth daily.   torsemide 20 MG tablet Commonly known as:  DEMADEX Take 3 tablets (60 mg total) by mouth daily as needed (as needed for lower ecxtremity edema , dyspnea, or weight gain (3 lbs in 24 hours or 5 lbs in 7 days).). What changed:    when to take this  reasons to take this       Allergies  Allergen Reactions  . Cefuroxime Diarrhea  . Celecoxib Nausea Only  . Deltasone [Prednisone] Swelling    Oral deltasone  is causing swelling (can use Depo-Medrol)  . Ranitidine Other (See Comments)    bloating  . Spironolactone Other (See Comments)    High K  . Tapazole [Methimazole] Rash  . Tramadol Hcl Nausea Only  . Doxycycline Diarrhea and Nausea Only  . Entresto [Sacubitril-Valsartan] Other (See Comments)    Unknown reaction per pt    Consultations:  Nephrology   Palliative Care   Procedures/Studies: US Renal  Result Date: 11/26/2018 CLINICAL DATA:  Acute renal disease. EXAM: RENAL / URINARY TRACT ULTRASOUND COMPLETE COMPARISON:  Renal ultrasound 12/14/2015 FINDINGS: Right Kidney: Renal measurements: 9.4 x 4.0 x 4.1 cm = volume: 80.6 mL. Mild thinning of renal parenchyma and borderline increased echogenicity. No  hydronephrosis. No focal renal mass. Left Kidney: Renal measurements: 9.5 x 4.2 x 3.7 cm = volume: 77.7 mL. Mild thinning of renal parenchyma. Borderline increased echogenicity. No hydronephrosis. No focal lesion. Bladder: Appears normal for degree of bladder distention. Small amount of ascites and bilateral pleural effusions are incidentally noted. IMPRESSION: 1. No obstructive uropathy. 2. Incidental note of small volume abdominopelvic ascites and pleural effusions. Electronically Signed   By: Keith Rake M.D.   On: 11/26/2018 20:59   Dg Chest Port 1 View  Result Date: 11/25/2018 CLINICAL DATA:  Weakness and shortness of breath EXAM: PORTABLE CHEST 1 VIEW COMPARISON:  August 08, 2019 FINDINGS: There is persistent consolidation in the left lower lobe with small left pleural effusion. The right lung is clear. Heart is mildly enlarged with pulmonary vascularity normal. There is aortic atherosclerosis. No adenopathy evident. Large hiatal hernia again noted. There are surgical clips over the left breast region. There is deviation of the upper thoracic trachea, a stable finding. There is superior migration of each humeral head. IMPRESSION: Large hiatal hernia with adjacent chronic consolidation and small left pleural effusion. Right lung clear. Stable cardiac prominence. Deviation of the trachea in the upper thoracic region to the right is likely due to thyroid mass, also present on prior CT. Aortic Atherosclerosis (ICD10-I70.0). Superior migration of each humeral head is likely indicative of chronic rotator cuff tears bilaterally. Electronically Signed   By: Lowella Grip III M.D.   On: 11/25/2018 16:15      Procedures:   Subjective: Patient is feeling better, her lower extremity pain has improved, no nausea or vomiting. Continue to have constipation. No dyspnea or chest pain.   Discharge Exam: Vitals:   12/01/18 2209 12/02/18 0433  BP: 99/71 101/68  Pulse: 90 74  Resp: 18 18  Temp: 97.8  F (36.6 C) 98.3 F (36.8 C)  SpO2: 97% 95%   Vitals:   12/01/18 0514 12/01/18 1318 12/01/18 2209 12/02/18 0433  BP: 104/63 111/66 99/71 101/68  Pulse: 84 89 90 74  Resp: 16 16 18 18   Temp: (!) 96.9 F (36.1 C)  97.8 F (36.6 C) 98.3 F (36.8 C)  TempSrc: Axillary  Oral Oral  SpO2: 95% 95% 97% 95%  Weight:      Height:        General: Not in pain or dyspnea  Neurology: Awake and alert, non focal  E ENT: no pallor, no icterus, oral mucosa moist Cardiovascular: No JVD. S1-S2 present, irregularly irregular, no gallops, rubs, or murmurs. No lower extremity edema. Pulmonary: positive breath sounds bilaterally, adequate air movement, no wheezing, rhonchi or rales. Gastrointestinal. Abdomen with, no organomegaly, non tender, no rebound or guarding Skin. Bilateral lower extremity wounds. More deep on the right, left with multiple lesions. No purulence.  Musculoskeletal:  no joint deformities         The results of significant diagnostics from this hospitalization (including imaging, microbiology, ancillary and laboratory) are listed below for reference.     Microbiology: Recent Results (from the past 240 hour(s))  Blood Culture (routine x 2)     Status: None   Collection Time: 11/25/18  3:19 PM  Result Value Ref Range Status   Specimen Description   Final    BLOOD BLOOD RIGHT FOREARM Performed at [facility] [address] [place], [place] [address]    Special Requests   Final    BOTTLES DRAWN AEROBIC AND ANAEROBIC Blood Culture adequate volume Performed at [facility] [address], [place], [place] [address]    Culture   Final    NO GROWTH 5 DAYS Performed at [facility], [place] [address], [place], [place] [address]    Report Status 11/30/2018 FINAL  Final  Blood Culture (routine x 2)     Status: None   Collection Time: 11/25/18  3:19 PM  Result Value Ref Range Status   Specimen Description   Final    BLOOD RIGHT  ANTECUBITAL Performed at [facility] [address], [place], [place] [address]    Special Requests   Final    BOTTLES DRAWN AEROBIC AND ANAEROBIC Blood Culture adequate volume Performed at [facility] [address] [place], [place] [address]    Culture   Final    NO GROWTH 5 DAYS Performed at [facility], [place] [address] [place], [place] [address]    Report Status 11/30/2018 FINAL  Final     Labs: BNP (last 3 results) Recent Labs    06/18/18 1905  BNP 3,762.8*   Basic Metabolic Panel: Recent Labs  Lab 11/27/18 0535 11/27/18 1003 11/28/18 0514 11/29/18 0516 11/30/18 0432 12/01/18 0531 12/02/18 0743  NA 135 137 133* 137 138 142 143  K 7.1* 5.7* 4.3 3.6 3.3* 3.6 4.1  CL 106 107 102 104 104 107 107  CO2 20* 20* 20* 22 22 27 28   GLUCOSE 114* 69* 87 103* 127* 80 96  BUN 60* 59* 64* 64* 62* 49* 41*  CREATININE 1.90* 1.96* 1.79* 1.46* 1.34* 1.08* 0.89  CALCIUM 7.2* 7.5* 6.8* 6.9* 7.0* 7.4* 7.6*  PHOS 5.1* 5.1* 4.1 3.6  --   --   --    Liver Function Tests: Recent Labs  Lab 11/25/18 1533 11/27/18 0535 11/27/18 1003 11/28/18 0514 11/29/18 0516  AST 22  --   --   --   --   ALT 27  --   --   --   --   ALKPHOS 115  --   --   --   --   BILITOT 1.1  --   --   --   --   PROT 6.1*  --   --   --   --   ALBUMIN 3.5 2.9* 3.0* 2.5* 2.6*   No results for input(s): LIPASE, AMYLASE in the last 168 hours. No results for input(s): AMMONIA in the last 168 hours. CBC: Recent Labs  Lab 11/25/18 1533 11/26/18 0525 11/27/18 0535  WBC 16.6* 14.1* 12.1*  NEUTROABS 14.7*  --   --   HGB 13.4 11.9* 12.8  HCT 43.2 39.2 41.7  MCV 102.4* 105.1* 104.0*  PLT 224 209 203   Cardiac Enzymes: No results for input(s): CKTOTAL, CKMB, CKMBINDEX, TROPONINI in the last 168  hours. BNP: Invalid input(s): POCBNP CBG: Recent Labs  Lab 11/27/18 0746 11/27/18 1008 11/27/18 1207 11/27/18 1229 11/27/18 1353  GLUCAP 96 74 63* 94 89    D-Dimer No results for input(s): DDIMER in the last 72 hours. Hgb A1c No results for input(s): HGBA1C in the last 72 hours. Lipid Profile No results for input(s): CHOL, HDL, LDLCALC, TRIG, CHOLHDL, LDLDIRECT in the last 72 hours. Thyroid function studies No results for input(s): TSH, T4TOTAL, T3FREE, THYROIDAB in the last 72 hours.  Invalid input(s): FREET3 Anemia work up No results for input(s): VITAMINB12, FOLATE, FERRITIN, TIBC, IRON, RETICCTPCT in the last 72 hours. Urinalysis    Component Value Date/Time   COLORURINE YELLOW 09/16/2018 1210   APPEARANCEUR HAZY (A) 09/16/2018 1210   LABSPEC 1.010 09/16/2018 1210   PHURINE 5.0 09/16/2018 1210   GLUCOSEU NEGATIVE 09/16/2018 1210   GLUCOSEU NEGATIVE 05/06/2018 1440   HGBUR NEGATIVE 09/16/2018 1210   BILIRUBINUR NEGATIVE 09/16/2018 1210   KETONESUR NEGATIVE 09/16/2018 1210   PROTEINUR NEGATIVE 09/16/2018 1210   UROBILINOGEN 0.2 05/06/2018 1440   NITRITE NEGATIVE 09/16/2018 1210   LEUKOCYTESUR MODERATE (A) 09/16/2018 1210   Sepsis Labs Invalid input(s): PROCALCITONIN,  WBC,  LACTICIDVEN Microbiology Recent Results (from the past 240 hour(s))  Blood Culture (routine x 2)     Status: None   Collection Time: 11/25/18  3:19 PM  Result Value Ref Range Status   Specimen Description   Final    BLOOD BLOOD RIGHT FOREARM Performed at [facility], [place] [address] [place], [place] [address]    Special Requests   Final    BOTTLES DRAWN AEROBIC AND ANAEROBIC Blood Culture adequate volume Performed at [facility] [address], [place], [place] [address]    Culture   Final    NO GROWTH 5 DAYS Performed at [facility], [place] [address], [place], [place] [address]    Report Status 11/30/2018 FINAL  Final  Blood Culture (routine x 2)     Status: None   Collection Time: 11/25/18  3:19 PM  Result Value Ref Range Status   Specimen Description   Final    BLOOD RIGHT ANTECUBITAL Performed  at [facility] [address], [place], [place] [address]    Special Requests   Final    BOTTLES DRAWN AEROBIC AND ANAEROBIC Blood Culture adequate volume Performed at [facility] [address] [place], [place] [address]    Culture   Final    NO GROWTH 5 DAYS Performed at [facility], [place] [address], [place],  [address]    Report Status 11/30/2018 FINAL  Final     Time coordinating discharge: 45 minutes  SIGNED:   Tawni Millers, MD  [facility] 12/02/2018, 11:12 AM

## 2018-12-03 ENCOUNTER — Telehealth: Payer: Self-pay | Admitting: *Deleted

## 2018-12-03 ENCOUNTER — Telehealth (HOSPITAL_COMMUNITY): Payer: Self-pay

## 2018-12-03 DIAGNOSIS — L03116 Cellulitis of left lower limb: Secondary | ICD-10-CM | POA: Diagnosis not present

## 2018-12-03 DIAGNOSIS — I4811 Longstanding persistent atrial fibrillation: Secondary | ICD-10-CM | POA: Diagnosis not present

## 2018-12-03 DIAGNOSIS — I5022 Chronic systolic (congestive) heart failure: Secondary | ICD-10-CM | POA: Diagnosis not present

## 2018-12-03 DIAGNOSIS — R531 Weakness: Secondary | ICD-10-CM | POA: Diagnosis not present

## 2018-12-03 NOTE — Telephone Encounter (Signed)
I called Mia Contreras to schedule an appointment. She stated that she had been discharged from the hospital to [facility] SNF and did not know what her long-term care plan looks like at the moment. She requested that I let her cardiologist know where she is currently. I advised that I would follow up with her after her discharge from the SNF.

## 2018-12-03 NOTE — Telephone Encounter (Signed)
Pt was on TCM report admitted 11/25/18 for sepsis due to lower extremity cellulitis complicated by acute kidney injury. Patient had severe worsening of her kidney function, her peak creatinine reached 1.96, potassium 7.1.  She was medically treated with intravenous bicarbonate, sodium zirconium, nephrotoxic agents and hypotension was avoided. She responded well to the medical therapy, her discharge creatinine is 0.86, potassium 4.1 and serum bicarbonate of 28. Pt D/C 12/02/18 to SNF w/palliative care. Per summary will need to follow-up w/PCP after discharge from SNF.Marland KitchenJohny Contreras

## 2018-12-04 DIAGNOSIS — N184 Chronic kidney disease, stage 4 (severe): Secondary | ICD-10-CM | POA: Diagnosis not present

## 2018-12-04 DIAGNOSIS — L0390 Cellulitis, unspecified: Secondary | ICD-10-CM | POA: Diagnosis not present

## 2018-12-04 DIAGNOSIS — E079 Disorder of thyroid, unspecified: Secondary | ICD-10-CM | POA: Diagnosis not present

## 2018-12-04 DIAGNOSIS — I4891 Unspecified atrial fibrillation: Secondary | ICD-10-CM | POA: Diagnosis not present

## 2018-12-04 DIAGNOSIS — I509 Heart failure, unspecified: Secondary | ICD-10-CM | POA: Diagnosis not present

## 2018-12-04 DIAGNOSIS — J449 Chronic obstructive pulmonary disease, unspecified: Secondary | ICD-10-CM | POA: Diagnosis not present

## 2018-12-05 ENCOUNTER — Ambulatory Visit: Payer: Medicare Other | Admitting: Internal Medicine

## 2018-12-05 DIAGNOSIS — L988 Other specified disorders of the skin and subcutaneous tissue: Secondary | ICD-10-CM | POA: Diagnosis not present

## 2018-12-05 DIAGNOSIS — I83028 Varicose veins of left lower extremity with ulcer other part of lower leg: Secondary | ICD-10-CM | POA: Diagnosis not present

## 2018-12-05 DIAGNOSIS — I83018 Varicose veins of right lower extremity with ulcer other part of lower leg: Secondary | ICD-10-CM | POA: Diagnosis not present

## 2018-12-06 DIAGNOSIS — I4811 Longstanding persistent atrial fibrillation: Secondary | ICD-10-CM | POA: Diagnosis not present

## 2018-12-06 DIAGNOSIS — I5022 Chronic systolic (congestive) heart failure: Secondary | ICD-10-CM | POA: Diagnosis not present

## 2018-12-06 DIAGNOSIS — L03115 Cellulitis of right lower limb: Secondary | ICD-10-CM | POA: Diagnosis not present

## 2018-12-06 DIAGNOSIS — L03116 Cellulitis of left lower limb: Secondary | ICD-10-CM | POA: Diagnosis not present

## 2018-12-10 DIAGNOSIS — I10 Essential (primary) hypertension: Secondary | ICD-10-CM | POA: Diagnosis not present

## 2018-12-10 DIAGNOSIS — I4811 Longstanding persistent atrial fibrillation: Secondary | ICD-10-CM | POA: Diagnosis not present

## 2018-12-10 DIAGNOSIS — R338 Other retention of urine: Secondary | ICD-10-CM | POA: Diagnosis not present

## 2018-12-10 DIAGNOSIS — I5022 Chronic systolic (congestive) heart failure: Secondary | ICD-10-CM | POA: Diagnosis not present

## 2018-12-11 ENCOUNTER — Other Ambulatory Visit: Payer: Self-pay

## 2018-12-11 ENCOUNTER — Non-Acute Institutional Stay: Payer: Medicare Other | Admitting: Licensed Clinical Social Worker

## 2018-12-12 ENCOUNTER — Telehealth: Payer: Self-pay | Admitting: Nurse Practitioner

## 2018-12-12 ENCOUNTER — Non-Acute Institutional Stay: Payer: Medicare Other | Admitting: Licensed Clinical Social Worker

## 2018-12-12 ENCOUNTER — Other Ambulatory Visit: Payer: Self-pay

## 2018-12-12 ENCOUNTER — Non-Acute Institutional Stay: Payer: Medicare Other | Admitting: Internal Medicine

## 2018-12-12 DIAGNOSIS — J9611 Chronic respiratory failure with hypoxia: Secondary | ICD-10-CM | POA: Diagnosis not present

## 2018-12-12 DIAGNOSIS — I10 Essential (primary) hypertension: Secondary | ICD-10-CM | POA: Diagnosis not present

## 2018-12-12 DIAGNOSIS — I4811 Longstanding persistent atrial fibrillation: Secondary | ICD-10-CM | POA: Diagnosis not present

## 2018-12-12 DIAGNOSIS — I5022 Chronic systolic (congestive) heart failure: Secondary | ICD-10-CM | POA: Diagnosis not present

## 2018-12-12 DIAGNOSIS — Z515 Encounter for palliative care: Secondary | ICD-10-CM

## 2018-12-12 NOTE — Telephone Encounter (Signed)
I received a message from Suzzanne Cloud, [place] at [facility]. Emmerson is there in SNF and getting rehab.   She continues to have very poor insight into her health issues and prognosis - she is determined to return home - she has basically no help available and is not able to afford home care according to past conversations that I have had with Inez Catalina.   Diuretics had been prn - she is having 4+ edema - will restart Torsemide - 60 mg for 3 days and then 40 mg a day - she can have lab there to follow her potassium and kidney function- may need to do short course of Zaroxolyn as we have done before.   Will be available as needed to [name] to help with her care. Hospice is still quite appropriate in my opinion.   Burtis Junes, RN, [facility] [address] [place], [place]  [id number] [phone]

## 2018-12-12 NOTE — Progress Notes (Signed)
COMMUNITY PALLIATIVE CARE SW NOTE  PATIENT NAME: Mia Contreras DOB: Oct 19, 1928 MRN: [id number]  PRIMARY CARE PROVIDER: Cassandria Anger, MD  RESPONSIBLE PARTY:  Acct ID - Guarantor Home Phone Work Phone Relationship Acct Type  [id number] - [phone]  Self P/F     [address], [place], [place] [address]   Due to the COVID-19 crisis, this virtual check-in visit was done via telephone from my office and it was initiated and consent given by this patient and or family.  PLAN OF CARE and INTERVENTIONS:             1. GOALS OF CARE/ ADVANCE CARE PLANNING:  Patient's goal is to get stronger and to walk.  She has a DNR. 2. SOCIAL/EMOTIONAL/SPIRITUAL ASSESSMENT/ INTERVENTIONS:  SW conducted a TeleHealth visit with patient and the facility SW, Peggy.  Patient was alert and and oriented.  She did not display any nonverbal indicators of pain.  Patient has a one story townhouse that she resided in prior to her hospitalization then subsequent admission to [facility] SNF.  Patient is single with no children.  Her brother lives in [place], [place], and is estranged per [place].  Her church friends from [facility] provide support.  She wants to remain as independent as possible. 3. PATIENT/CAREGIVER EDUCATION/ COPING:  Patient copes by expressing her feelings openly.  SW provided education regarding Palliative Care. 4. PERSONAL EMERGENCY PLAN:  Per facility protocol. 5. COMMUNITY RESOURCES COORDINATION/ HEALTH CARE NAVIGATION:  None. 6. FINANCIAL/LEGAL CONCERNS/INTERVENTIONS:  Facility SW indicated financial concerns.     SOCIAL HX:  Social History   Tobacco Use  . Smoking status: Former Smoker    Last attempt to quit: 12/20/1980    Years since quitting: 38.0  . Smokeless tobacco: Never Used  Substance Use Topics  . Alcohol use: Not Currently    CODE STATUS:  DNR  ADVANCED DIRECTIVES: N MOST FORM COMPLETE:  N HOSPICE EDUCATION PROVIDED: N PPS:  Patient's appetite is  normal.  She was lying in bed during the visit. Duration of visit and documentation:  30 Minutes.      Creola Corn Shakeitha Umbaugh, LCSW

## 2018-12-13 DIAGNOSIS — I5022 Chronic systolic (congestive) heart failure: Secondary | ICD-10-CM | POA: Diagnosis not present

## 2018-12-13 DIAGNOSIS — J9611 Chronic respiratory failure with hypoxia: Secondary | ICD-10-CM | POA: Diagnosis not present

## 2018-12-13 DIAGNOSIS — I4811 Longstanding persistent atrial fibrillation: Secondary | ICD-10-CM | POA: Diagnosis not present

## 2018-12-13 DIAGNOSIS — I10 Essential (primary) hypertension: Secondary | ICD-10-CM | POA: Diagnosis not present

## 2018-12-16 DIAGNOSIS — H1089 Other conjunctivitis: Secondary | ICD-10-CM | POA: Diagnosis not present

## 2018-12-16 DIAGNOSIS — R338 Other retention of urine: Secondary | ICD-10-CM | POA: Diagnosis not present

## 2018-12-16 DIAGNOSIS — J449 Chronic obstructive pulmonary disease, unspecified: Secondary | ICD-10-CM | POA: Diagnosis not present

## 2018-12-16 DIAGNOSIS — I5022 Chronic systolic (congestive) heart failure: Secondary | ICD-10-CM | POA: Diagnosis not present

## 2018-12-17 DIAGNOSIS — R05 Cough: Secondary | ICD-10-CM | POA: Diagnosis not present

## 2018-12-17 DIAGNOSIS — I5022 Chronic systolic (congestive) heart failure: Secondary | ICD-10-CM | POA: Diagnosis not present

## 2018-12-17 DIAGNOSIS — I4811 Longstanding persistent atrial fibrillation: Secondary | ICD-10-CM | POA: Diagnosis not present

## 2018-12-17 DIAGNOSIS — H1089 Other conjunctivitis: Secondary | ICD-10-CM | POA: Diagnosis not present

## 2018-12-18 ENCOUNTER — Ambulatory Visit: Payer: Medicare Other | Admitting: Nurse Practitioner

## 2018-12-19 DIAGNOSIS — R338 Other retention of urine: Secondary | ICD-10-CM | POA: Diagnosis not present

## 2018-12-19 DIAGNOSIS — E876 Hypokalemia: Secondary | ICD-10-CM | POA: Diagnosis not present

## 2018-12-19 DIAGNOSIS — R05 Cough: Secondary | ICD-10-CM | POA: Diagnosis not present

## 2018-12-19 DIAGNOSIS — I5022 Chronic systolic (congestive) heart failure: Secondary | ICD-10-CM | POA: Diagnosis not present

## 2018-12-20 DIAGNOSIS — R338 Other retention of urine: Secondary | ICD-10-CM | POA: Diagnosis not present

## 2018-12-20 DIAGNOSIS — I5022 Chronic systolic (congestive) heart failure: Secondary | ICD-10-CM | POA: Diagnosis not present

## 2018-12-20 DIAGNOSIS — H1089 Other conjunctivitis: Secondary | ICD-10-CM | POA: Diagnosis not present

## 2018-12-20 DIAGNOSIS — I4811 Longstanding persistent atrial fibrillation: Secondary | ICD-10-CM | POA: Diagnosis not present

## 2018-12-23 DIAGNOSIS — I4811 Longstanding persistent atrial fibrillation: Secondary | ICD-10-CM | POA: Diagnosis not present

## 2018-12-23 DIAGNOSIS — I5022 Chronic systolic (congestive) heart failure: Secondary | ICD-10-CM | POA: Diagnosis not present

## 2018-12-23 DIAGNOSIS — B0230 Zoster ocular disease, unspecified: Secondary | ICD-10-CM | POA: Diagnosis not present

## 2018-12-23 DIAGNOSIS — R338 Other retention of urine: Secondary | ICD-10-CM | POA: Diagnosis not present

## 2018-12-24 DIAGNOSIS — B0230 Zoster ocular disease, unspecified: Secondary | ICD-10-CM | POA: Diagnosis not present

## 2018-12-24 DIAGNOSIS — I4811 Longstanding persistent atrial fibrillation: Secondary | ICD-10-CM | POA: Diagnosis not present

## 2018-12-24 DIAGNOSIS — I5022 Chronic systolic (congestive) heart failure: Secondary | ICD-10-CM | POA: Diagnosis not present

## 2018-12-24 DIAGNOSIS — J449 Chronic obstructive pulmonary disease, unspecified: Secondary | ICD-10-CM | POA: Diagnosis not present

## 2018-12-25 ENCOUNTER — Encounter: Payer: Self-pay | Admitting: Endocrinology

## 2018-12-25 ENCOUNTER — Encounter: Payer: Medicare Other | Admitting: Endocrinology

## 2018-12-25 ENCOUNTER — Other Ambulatory Visit: Payer: Self-pay

## 2018-12-26 ENCOUNTER — Telehealth: Payer: Self-pay | Admitting: Hematology

## 2018-12-26 ENCOUNTER — Encounter: Payer: Self-pay | Admitting: Endocrinology

## 2018-12-26 ENCOUNTER — Other Ambulatory Visit: Payer: Self-pay

## 2018-12-26 DIAGNOSIS — C50212 Malignant neoplasm of upper-inner quadrant of left female breast: Secondary | ICD-10-CM

## 2018-12-26 DIAGNOSIS — Z170 Estrogen receptor positive status [ER+]: Principal | ICD-10-CM

## 2018-12-26 NOTE — Telephone Encounter (Signed)
Left vm for pt to call back and try to convert appt for 5/11//20 into a Webex mtg.

## 2018-12-27 ENCOUNTER — Telehealth: Payer: Self-pay | Admitting: Hematology

## 2018-12-27 ENCOUNTER — Ambulatory Visit (INDEPENDENT_AMBULATORY_CARE_PROVIDER_SITE_OTHER): Payer: Medicare Other | Admitting: Endocrinology

## 2018-12-27 DIAGNOSIS — E0520 Thyrotoxicosis with toxic multinodular goiter without thyrotoxic crisis or storm: Secondary | ICD-10-CM

## 2018-12-27 NOTE — Patient Instructions (Signed)
Please fax [name] the recent TSH result. Please come back for a follow-up appointment in 6 months

## 2018-12-27 NOTE — Progress Notes (Signed)
Subjective:    Patient ID: Mia Contreras, female    DOB: 1928/12/08, 85 y.o.   MRN: [id number]  HPI  telehealth visit today via doxy video visit.  Alternatives to telehealth are presented to this patient, and the patient agrees to the telehealth visit. Pt is advised of the cost of the visit, and agrees to this, also.   Patient is at [facility], and I am at the office.   Persons attending the telehealth visit: the patient, caregiver, and I.   Pt returns for f/u of hyperthyroidism (due to large multinodular goiter; dx'ed 2008; bx then showed HYPERPLASTICNODULE. in 2011, pt had RAI for hyperthyroidism; she became euthyroid, last [name] in early 2015 was unchanged; in 2017, hyperthyroidism recurred; plan was for repeat RAI, but RAI uptake was low; pt says she does not take any iodine-containing products, so she was rx'ed tapazole; this was changed to PTU, due to a rash; the rash was since determined to be psoriasis; she stopped PTU in 1/19, due to ongoing rash, and has been off rx since then).  pt states she feels well in general.  Caregiver says TSH was recently drawn.    Past Medical History:  Diagnosis Date  . AKI (acute kidney injury) ([place]) 01/2016  . ANEMIA-NOS 08/20/2007  . ANXIETY 03/16/2007  . BREAST CANCER, HX OF 03/16/2007  . CARPAL TUNNEL SYNDROME, RIGHT 01/11/2008  . Cataract    floaters  . CELLULITIS, LEG, RIGHT 08/20/2007  . CHF (congestive heart failure) ([place])   . Collagenous colitis   . Congestive heart failure with right ventricular systolic dysfunction ([place]) 06/19/2018  . COPD (chronic obstructive pulmonary disease) ([place])   . DIVERTICULOSIS, COLON 08/20/2007  . Dyspnea    on exertion  . Dysrhythmia    atrial fib  . Family history of brain tumor   . Family history of lung cancer   . Family history of lymphoma   . Family history of prostate cancer   . Family history of skin cancer   . GOITER, MULTINODULAR 05/06/2010   Dr [name]  . HYPERLIPIDEMIA 08/20/2007  . HYPERTENSION  03/16/2007  . HYPERTHYROIDISM 05/23/2010  . IBS (irritable bowel syndrome)   . OSTEOARTHRITIS 03/16/2007  . OSTEOPOROSIS 08/20/2007  . Personal history of breast cancer 07/01/2018  . Pneumonia 07/2011   took abx for several weeks  . PREMATURE ATRIAL CONTRACTIONS 09/21/2010  . SCOLIOSIS 01/11/2008  . Severe tricuspid regurgitation 06/19/2018  . SKIN CANCER, HX OF 11/12/2008    R cheek 2010, L Cheek 2011 Dr. Tonia Brooms  . Thyroid nodule   . Uterine cancer ([place])   . VENOUS INSUFFICIENCY 09/05/2007    Past Surgical History:  Procedure Laterality Date  . ABDOMINAL HYSTERECTOMY    . APPENDECTOMY  1972  . BREAST LUMPECTOMY Left 2001  . BREAST LUMPECTOMY WITH RADIOACTIVE SEED LOCALIZATION Left 06/11/2018   Procedure: LEFT BREAST LUMPECTOMY WITH BRACKETED RADIOACTIVE SEED LOCALIZATION;  Surgeon: Fanny Skates, MD;  Location: [facility];  Service: General;  Laterality: Left;  . CARPAL TUNNEL RELEASE    . CATARACT EXTRACTION    . EYE SURGERY     cataract ext  . HERNIA REPAIR    . MASTECTOMY, PARTIAL Left   . SKIN CANCER EXCISION     face-Dr. Bing Plume  . TOTAL KNEE ARTHROPLASTY    . TOTAL KNEE ARTHROPLASTY Right 2010   Dr Para March  . TOTAL KNEE ARTHROPLASTY  10/30/2011   Procedure: TOTAL KNEE ARTHROPLASTY;  Surgeon: Lorn Junes, MD;  Location: [facility]  OR;  Service: Orthopedics;  Laterality: Left;     . XRT     chemo, surgery    Social History   Socioeconomic History  . Marital status: Single    Spouse name: Not on file  . Number of children: Not on file  . Years of education: Not on file  . Highest education level: Not on file  Occupational History  . Not on file  Social Needs  . Financial resource strain: Not on file  . Food insecurity:    Worry: Not on file    Inability: Not on file  . Transportation needs:    Medical: Not on file    Non-medical: Not on file  Tobacco Use  . Smoking status: Former Smoker    Last attempt to quit: 12/20/1980    Years since quitting: 38.0  . Smokeless  tobacco: Never Used  Substance and Sexual Activity  . Alcohol use: Not Currently  . Drug use: No  . Sexual activity: Not Currently  Lifestyle  . Physical activity:    Days per week: Not on file    Minutes per session: Not on file  . Stress: Not on file  Relationships  . Social connections:    Talks on phone: Not on file    Gets together: Not on file    Attends religious service: Not on file    Active member of club or organization: Not on file    Attends meetings of clubs or organizations: Not on file    Relationship status: Not on file  . Intimate partner violence:    Fear of current or ex partner: Not on file    Emotionally abused: Not on file    Physically abused: Not on file    Forced sexual activity: Not on file  Other Topics Concern  . Not on file  Social History Narrative  . Not on file    Current Outpatient Medications on File Prior to Visit  Medication Sig Dispense Refill  . acetaminophen (TYLENOL) 500 MG tablet Take 500 mg by mouth at bedtime as needed.    . ALPRAZolam (XANAX) 0.5 MG tablet Take 1 tablet (0.5 mg total) by mouth 2 (two) times daily as needed for anxiety or sleep. 20 tablet 0  . carvedilol (COREG) 3.125 MG tablet Take 2 tablets (6.25 mg total) by mouth 2 (two) times daily with a meal. (Patient taking differently: Take 3.125 mg by mouth 2 (two) times daily with a meal. ) 60 tablet 9  . collagenase (SANTYL) ointment Apply topically daily. Apply to full thickness wound on the left lower extremity with necrotic tissue once daily, 1/8 inch layer. 15 g 0  . ELIQUIS 2.5 MG TABS tablet TAKE ONE TABLET BY MOUTH TWICE A DAY  **MUST CALL MD FOR APPOINTMENT FOR ADDITIONAL REFILLS (Patient taking differently: Take 2.5 mg by mouth 2 (two) times daily. ) 60 tablet 1  . OXYGEN Inhale 1.5 L into the lungs at bedtime.    . polyethylene glycol (MIRALAX / GLYCOLAX) 17 g packet Take 17 g by mouth 2 (two) times daily. 14 each 0  . Probiotic Product (PROBIOTIC PO) Take 1  tablet by mouth daily.    Marland Kitchen torsemide (DEMADEX) 20 MG tablet Take 3 tablets (60 mg total) by mouth daily as needed (as needed for lower ecxtremity edema , dyspnea, or weight gain (3 lbs in 24 hours or 5 lbs in 7 days).). 30 tablet 0   No current facility-administered medications on file  prior to visit.     Allergies  Allergen Reactions  . Cefuroxime Diarrhea  . Celecoxib Nausea Only  . Deltasone [Prednisone] Swelling    Oral deltasone is causing swelling (can use Depo-Medrol)  . Ranitidine Other (See Comments)    bloating  . Spironolactone Other (See Comments)    High K  . Tapazole [Methimazole] Rash  . Tramadol Hcl Nausea Only  . Doxycycline Diarrhea and Nausea Only  . Entresto [Sacubitril-Valsartan] Other (See Comments)    Unknown reaction per pt    Family History  Problem Relation Age of Onset  . Dementia Mother   . Heart disease Mother   . Mental retardation Mother   . Hypertension Mother   . Alzheimer's disease Mother   . Heart attack Father   . Alcohol abuse Father   . Prostate cancer Brother        dx >50  . Bladder Cancer Brother        dx >50  . Diabetes Other   . Brain cancer Maternal Aunt 70  . Brain cancer Maternal Uncle 40  . Lung cancer Paternal Uncle   . Heart attack Maternal Grandmother   . Heart attack Maternal Grandfather   . Lymphoma Other 38       NH  . Skin cancer Other   . Colon cancer Neg Hx   . Anesthesia problems Neg Hx      Review of Systems Denies fever.      Objective:   Physical Exam     Assessment & Plan:  Hyperthyroidism: uncertain control Frail elderly state: we'll review TFT done there on admission, rather than rechecking here  Patient Instructions  Please fax [name] the recent TSH result. Please come back for a follow-up appointment in 6 months

## 2018-12-27 NOTE — Telephone Encounter (Signed)
Called patient regarding upcoming Webex appointment, patient is currently in rehab and has no access for transportation for appointments and does not have access for Webex. Patient would like to cancel until she can leave rehab.  Message to provider.

## 2018-12-30 ENCOUNTER — Inpatient Hospital Stay: Payer: Medicare Other | Admitting: Hematology

## 2018-12-30 ENCOUNTER — Other Ambulatory Visit: Payer: Medicare Other

## 2018-12-30 DIAGNOSIS — R627 Adult failure to thrive: Secondary | ICD-10-CM | POA: Diagnosis not present

## 2018-12-30 DIAGNOSIS — I5022 Chronic systolic (congestive) heart failure: Secondary | ICD-10-CM | POA: Diagnosis not present

## 2018-12-30 DIAGNOSIS — B0230 Zoster ocular disease, unspecified: Secondary | ICD-10-CM | POA: Diagnosis not present

## 2018-12-30 DIAGNOSIS — R338 Other retention of urine: Secondary | ICD-10-CM | POA: Diagnosis not present

## 2019-01-09 ENCOUNTER — Telehealth (HOSPITAL_COMMUNITY): Payer: Self-pay

## 2019-01-09 NOTE — Telephone Encounter (Signed)
I attempted to call Ms Cormany to see if she had come home form the SNF. Upon dialing her number a recording came on and stated that the number was no longer in service. I reached out to Raquel Sarna, LCSW, who advised she would look into it and get back to me.

## 2019-01-20 DEATH — deceased

## 2019-01-29 DIAGNOSIS — G3184 Mild cognitive impairment, so stated: Secondary | ICD-10-CM | POA: Diagnosis not present

## 2019-01-29 DIAGNOSIS — F331 Major depressive disorder, recurrent, moderate: Secondary | ICD-10-CM | POA: Diagnosis not present

## 2019-02-03 ENCOUNTER — Other Ambulatory Visit: Payer: Self-pay | Admitting: Cardiovascular Disease
